# Patient Record
Sex: Female | Born: 1955 | Race: White | Hispanic: No | Marital: Married | Smoking: Former smoker
Health system: Southern US, Community
[De-identification: ages and names within clinical notes are randomized; demographics above are authoritative.]

## PROBLEM LIST (undated history)

## (undated) DIAGNOSIS — K5090 Crohn's disease, unspecified, without complications: Secondary | ICD-10-CM

## (undated) DIAGNOSIS — R51 Headache: Secondary | ICD-10-CM

## (undated) DIAGNOSIS — Z9889 Other specified postprocedural states: Secondary | ICD-10-CM

## (undated) DIAGNOSIS — Z9119 Patient's noncompliance with other medical treatment and regimen: Principal | ICD-10-CM

## (undated) DIAGNOSIS — C801 Malignant (primary) neoplasm, unspecified: Secondary | ICD-10-CM

## (undated) DIAGNOSIS — R519 Headache, unspecified: Secondary | ICD-10-CM

## (undated) DIAGNOSIS — G8929 Other chronic pain: Secondary | ICD-10-CM

## (undated) DIAGNOSIS — J329 Chronic sinusitis, unspecified: Secondary | ICD-10-CM

## (undated) DIAGNOSIS — R112 Nausea with vomiting, unspecified: Secondary | ICD-10-CM

## (undated) HISTORY — DX: Chronic sinusitis, unspecified: J32.9

## (undated) HISTORY — DX: Patient's noncompliance with other medical treatment and regimen: Z91.19

## (undated) HISTORY — PX: BREAST SURGERY: SHX581

## (undated) HISTORY — PX: UTERINE FIBROID SURGERY: SHX826

---

## 1997-09-28 ENCOUNTER — Other Ambulatory Visit: Admission: RE | Admit: 1997-09-28 | Discharge: 1997-09-28 | Payer: Self-pay | Admitting: *Deleted

## 1999-08-19 ENCOUNTER — Encounter: Payer: Self-pay | Admitting: Obstetrics and Gynecology

## 1999-08-19 ENCOUNTER — Encounter: Admission: RE | Admit: 1999-08-19 | Discharge: 1999-08-19 | Payer: Self-pay | Admitting: Obstetrics and Gynecology

## 2000-10-27 ENCOUNTER — Encounter: Payer: Self-pay | Admitting: Obstetrics and Gynecology

## 2000-10-27 ENCOUNTER — Encounter: Admission: RE | Admit: 2000-10-27 | Discharge: 2000-10-27 | Payer: Self-pay | Admitting: Obstetrics and Gynecology

## 2010-09-18 ENCOUNTER — Other Ambulatory Visit (HOSPITAL_COMMUNITY): Payer: Self-pay | Admitting: Internal Medicine

## 2010-09-18 DIAGNOSIS — H532 Diplopia: Secondary | ICD-10-CM

## 2010-09-20 ENCOUNTER — Ambulatory Visit (HOSPITAL_COMMUNITY)
Admission: RE | Admit: 2010-09-20 | Discharge: 2010-09-20 | Disposition: A | Discharge status: Home / Self Care | Payer: 59 | Source: Ambulatory Visit | Attending: Internal Medicine | Admitting: Internal Medicine

## 2010-09-20 ENCOUNTER — Other Ambulatory Visit (HOSPITAL_COMMUNITY): Payer: Self-pay

## 2010-09-20 DIAGNOSIS — H532 Diplopia: Secondary | ICD-10-CM | POA: Insufficient documentation

## 2010-09-20 DIAGNOSIS — R51 Headache: Secondary | ICD-10-CM | POA: Insufficient documentation

## 2010-12-30 ENCOUNTER — Other Ambulatory Visit: Payer: Self-pay | Admitting: Diagnostic Neuroimaging

## 2010-12-30 DIAGNOSIS — H539 Unspecified visual disturbance: Secondary | ICD-10-CM

## 2011-01-09 ENCOUNTER — Other Ambulatory Visit: Payer: 59

## 2011-01-14 ENCOUNTER — Ambulatory Visit
Admission: RE | Admit: 2011-01-14 | Discharge: 2011-01-14 | Disposition: A | Discharge status: Home / Self Care | Payer: 59 | Source: Ambulatory Visit | Attending: Diagnostic Neuroimaging | Admitting: Diagnostic Neuroimaging

## 2011-01-14 DIAGNOSIS — H539 Unspecified visual disturbance: Secondary | ICD-10-CM

## 2012-07-01 ENCOUNTER — Other Ambulatory Visit (HOSPITAL_COMMUNITY): Payer: Self-pay | Admitting: Internal Medicine

## 2012-07-01 DIAGNOSIS — Z139 Encounter for screening, unspecified: Secondary | ICD-10-CM

## 2012-07-05 ENCOUNTER — Ambulatory Visit (HOSPITAL_COMMUNITY): Payer: 59

## 2012-07-06 ENCOUNTER — Ambulatory Visit (HOSPITAL_COMMUNITY)
Admission: RE | Admit: 2012-07-06 | Discharge: 2012-07-06 | Disposition: A | Discharge status: Home / Self Care | Payer: 59 | Source: Ambulatory Visit | Attending: Internal Medicine | Admitting: Internal Medicine

## 2012-07-06 DIAGNOSIS — Z139 Encounter for screening, unspecified: Secondary | ICD-10-CM

## 2012-07-06 DIAGNOSIS — Z1231 Encounter for screening mammogram for malignant neoplasm of breast: Secondary | ICD-10-CM | POA: Insufficient documentation

## 2014-05-31 ENCOUNTER — Encounter (HOSPITAL_COMMUNITY): Payer: Self-pay | Admitting: Emergency Medicine

## 2014-05-31 ENCOUNTER — Emergency Department (HOSPITAL_COMMUNITY): Payer: 59

## 2014-05-31 ENCOUNTER — Emergency Department (HOSPITAL_COMMUNITY)
Admission: EM | Admit: 2014-05-31 | Discharge: 2014-05-31 | Disposition: A | Discharge status: Home / Self Care | Payer: 59 | Attending: Emergency Medicine | Admitting: Emergency Medicine

## 2014-05-31 DIAGNOSIS — Z880 Allergy status to penicillin: Secondary | ICD-10-CM | POA: Diagnosis not present

## 2014-05-31 DIAGNOSIS — Z8719 Personal history of other diseases of the digestive system: Secondary | ICD-10-CM | POA: Insufficient documentation

## 2014-05-31 DIAGNOSIS — Z720 Tobacco use: Secondary | ICD-10-CM | POA: Diagnosis not present

## 2014-05-31 DIAGNOSIS — H81399 Other peripheral vertigo, unspecified ear: Secondary | ICD-10-CM | POA: Diagnosis not present

## 2014-05-31 DIAGNOSIS — G8929 Other chronic pain: Secondary | ICD-10-CM | POA: Insufficient documentation

## 2014-05-31 DIAGNOSIS — R42 Dizziness and giddiness: Secondary | ICD-10-CM | POA: Diagnosis present

## 2014-05-31 HISTORY — DX: Headache: R51

## 2014-05-31 HISTORY — DX: Crohn's disease, unspecified, without complications: K50.90

## 2014-05-31 HISTORY — DX: Headache, unspecified: R51.9

## 2014-05-31 HISTORY — DX: Other chronic pain: G89.29

## 2014-05-31 LAB — CBC WITH DIFFERENTIAL/PLATELET
BASOS PCT: 0 % (ref 0–1)
Basophils Absolute: 0 10*3/uL (ref 0.0–0.1)
Eosinophils Absolute: 0.2 10*3/uL (ref 0.0–0.7)
Eosinophils Relative: 2 % (ref 0–5)
HCT: 33.5 % — ABNORMAL LOW (ref 36.0–46.0)
HEMOGLOBIN: 10.9 g/dL — AB (ref 12.0–15.0)
LYMPHS ABS: 1.4 10*3/uL (ref 0.7–4.0)
Lymphocytes Relative: 13 % (ref 12–46)
MCH: 29.4 pg (ref 26.0–34.0)
MCHC: 32.5 g/dL (ref 30.0–36.0)
MCV: 90.3 fL (ref 78.0–100.0)
MONOS PCT: 11 % (ref 3–12)
Monocytes Absolute: 1.1 10*3/uL — ABNORMAL HIGH (ref 0.1–1.0)
NEUTROS ABS: 7.4 10*3/uL (ref 1.7–7.7)
NEUTROS PCT: 74 % (ref 43–77)
PLATELETS: 167 10*3/uL (ref 150–400)
RBC: 3.71 MIL/uL — AB (ref 3.87–5.11)
RDW: 12.3 % (ref 11.5–15.5)
WBC: 10.1 10*3/uL (ref 4.0–10.5)

## 2014-05-31 LAB — BASIC METABOLIC PANEL
ANION GAP: 10 (ref 5–15)
BUN: 22 mg/dL (ref 6–23)
CALCIUM: 8.8 mg/dL (ref 8.4–10.5)
CO2: 26 mmol/L (ref 19–32)
CREATININE: 0.69 mg/dL (ref 0.50–1.10)
Chloride: 97 mmol/L (ref 96–112)
Glucose, Bld: 95 mg/dL (ref 70–99)
Potassium: 3.9 mmol/L (ref 3.5–5.1)
Sodium: 133 mmol/L — ABNORMAL LOW (ref 135–145)

## 2014-05-31 MED ORDER — MECLIZINE HCL 25 MG PO TABS: 25.0000 mg | ORAL_TABLET | Freq: Three times a day (TID) | ORAL | Status: DC | PRN

## 2014-05-31 MED ORDER — ONDANSETRON 4 MG PO TBDP
4.0000 mg | ORAL_TABLET | Freq: Three times a day (TID) | ORAL | Status: DC | PRN
Start: 2014-05-31 — End: 2014-08-16

## 2014-05-31 MED ORDER — MECLIZINE HCL 12.5 MG PO TABS: 25.0000 mg | ORAL_TABLET | Freq: Once | ORAL | Status: DC

## 2014-05-31 NOTE — ED Notes (Signed)
Pt reports she had been cleaning the house today and sat down to rest. Pt states the room started spinning. Pt reports rapid heart rate during the time. Pt reports "a little dizziness" now with a headache. Pt has Crohn's and reports flare-up that is being treated with flagyl.

## 2014-05-31 NOTE — Discharge Instructions (Signed)
°Emergency Department Resource Guide °1) Find a Doctor and Pay Out of Pocket °Although you won't have to find out who is covered by your insurance plan, it is a good idea to ask around and get recommendations. You will then need to call the office and see if the doctor you have chosen will accept you as a new patient and what types of options they offer for patients who are self-pay. Some doctors offer discounts or will set up payment plans for their patients who do not have insurance, but you will need to ask so you aren't surprised when you get to your appointment. ° °2) Contact Your Local Health Department °Not all health departments have doctors that can see patients for sick visits, but many do, so it is worth a call to see if yours does. If you don't know where your local health department is, you can check in your phone book. The CDC also has a tool to help you locate your state's health department, and many state websites also have listings of all of their local health departments. ° °3) Find a Walk-in Clinic °If your illness is not likely to be very severe or complicated, you may want to try a walk in clinic. These are popping up all over the country in pharmacies, drugstores, and shopping centers. They're usually staffed by nurse practitioners or physician assistants that have been trained to treat common illnesses and complaints. They're usually fairly quick and inexpensive. However, if you have serious medical issues or chronic medical problems, these are probably not your best option. ° °No Primary Care Doctor: °- Call [facility] at  832-8000 - they can help you locate a primary care doctor that  accepts your insurance, provides certain services, etc. °- Physician Referral Service- 1-800-533-3463 ° °Chronic Pain Problems: °Organization         Address  Phone   Notes  °[facility]  (336) 297-2271 Patients need to be referred by their primary care doctor.  ° °Medication  Assistance: °Organization         Address  Phone   Notes  °[place] [facility] [address] °[place], [place] [address] (336) 641-8030 --Must be a resident of [place] °-- Must have NO insurance coverage whatsoever (no Medicaid/ Medicare, etc.) °-- The pt. MUST have a primary care doctor that directs their care regularly and follows them in the community °  °[facility]  (866) 331-1348   °[employer]  (888) 892-1162   ° °Agencies that provide inexpensive medical care: °Organization         Address  Phone   Notes  °[facility]  (336) 832-8035   °[facility]    (336) 832-7272   °[facility] [address] °[place], [place] [address] (336) 832-4777   °[facility] [address], °[place] (336) 271-4999   °[facility]    (336) 373-0678   °[facility]    (336) 272-1050   °[facility] ° [address], [place] Phone:  (336) 832-4444, Fax:  (336) 832-4440 Hours of Operation:  9 am - 6 pm, M-F.  Also accepts Medicaid/Medicare and self-pay.  °[facility] ° [address],  Phone: (336) 832-3150, Fax: (336) 832-3151. Hours of Operation:  8:30 am - 5:30 pm, M-F.  Also accepts Medicaid and self-pay.  °[facility] [place] [address], [place] Phone: (336) 878-6027   °[facility] [address], [place], [place] (336)723-1848, Ext. 123 Mondays & Thursdays: 7-9 AM.  First 15 patients are seen on a first come, first serve basis. °  ° °Medicaid-accepting [place] Providers: ° °Organization         Address  Phone   Notes  °[facility] [address], [place] (336) 641-2100 Also accepts self-pay patients.  °[facility] [address], [place] ° (336) 856-9996   °[facility] [address], [place]  (336) 288-8857   °[facility] [address] [place] [address], [place] (336) 299-7000   °Veita Bland [address], [place]  ° (336) 373-1557 Only accepts [place] Access Medicaid patients after they have their name applied to their card.  ° °Self-Pay (no insurance) in [place]: ° °Organization         Address  Phone   Notes  °Sickle Cell Patients, [facility] [address], [place] (336) 832-1970   °[facility] [address], [place] (336) 832-4400   °[facility] ° [address], [place] (336) 992-4800   °[facility]/Dr. Osei-Bonsu ° [address] [place] [address], [place] or [address], [place] (336) 841-8500 Phone number for both [place] and [place] locations is the same.  °[facility] [address], [place] (336) 299-0000   °[facility] [address] [place] [address], [place] or [address] (336) 852-7530 °(336) 878-2260   °[facility] [address], [place] (336) 350-1642, phone; (336) 294-5005, fax Sees patients 1st and 3rd Saturday of every month.  Must not qualify for public or private insurance (i.e. Medicaid, Medicare, [place] Health Choice, Veterans' Benefits) • Household income should be no more than 200% of the poverty level •The clinic cannot treat you if you are pregnant or think you are pregnant • Sexually transmitted diseases are not treated at the clinic.  ° ° °Dental Care: °Organization         Address  Phone  Notes  °[place] [name] [address], [place] (336) 641-6152 Accepts children up to age 21 who are enrolled in Medicaid or [place] Health Choice; pregnant women with a Medicaid card; and children who have applied for Medicaid or [place] Health Choice, but were declined, whose parents can pay a reduced fee at time of service.  °[place]  [facility] [place]  [address], [place] (336) 641-7733 Accepts children up to age 21 who are enrolled in Medicaid or [place] Health Choice; pregnant women with a Medicaid card; and children who have applied for Medicaid or [place] Health Choice, but were declined, whose parents can pay a reduced fee at time of service.  °[facility] ° [address], [place] (336) 641-4533 Patients are seen by appointment only. Walk-ins are not accepted. [facility] will see patients 18 years of age and older. °Monday - Tuesday (8am-5pm) °Most Wednesdays (8:30-5pm) °$30 per visit, cash only  °[facility] ° [address], [place] (336) 641-4533 Patients are seen by appointment only. Walk-ins are not accepted. [facility] will see patients 18 years of age and older. °One   Wednesday Evening (Monthly: Volunteer Based).  $30 per visit, cash only  °[facility]  (919) 537-3737 for adults; Children under age 4, call Graduate Pediatric Dentistry at (919) [phone]. Children aged 4-14, please call (919) [phone] to request a pediatric application. ° Dental services are provided in all areas of dental care including fillings, crowns and bridges, complete and partial dentures, implants, gum treatment, root canals, and extractions. Preventive care is also provided. Treatment is provided to both adults and children. °Patients are selected via a lottery and there is often a waiting list. °  °[facility] [address], °[place] ° (336) 763-8833 [email or web address] °  °[facility] [address], [place], [place] (336)723-1848, Ext. 123 Second and Fourth Thursday of each month, opens at 6:30 AM; Clinic ends at 9 AM.  Patients are seen on a first-come first-served basis, and a limited number are seen during each clinic.  ° °[facility] ° [address], [place], [place] (336) 723-7904    Eligibility Requirements °You must have lived in [place], [place], or [place] counties for at least the last three months. °  You cannot be eligible for state or federal sponsored healthcare insurance, including Veterans Administration, Medicaid, or Medicare. °  You generally cannot be eligible for healthcare insurance through your employer.  °  How to apply: °Eligibility screenings are held every Tuesday and Wednesday afternoon from 1:00 pm until 4:00 pm. You do not need an appointment for the interview!  °[facility] [address], [place], [place] 336-631-2330   °[place] [facility]  336-342-8273   °[place]  336-703-3100   °[facility]  336-570-6415   ° °Behavioral Health Resources in the Community: °Intensive Outpatient Programs °Organization         Address  Phone  Notes  °[facility] [address], [place], [place] 336-878-6098   °[facility] [address], [place], [place] 336-832-9800   °[facility] [address], [place], [place] ° 336-882-2125   °[facility] [address],  °[place], [place] 1-800-853-5163 or 336-641-4981   °Substance Abuse Resources °Organization         Address  Phone  Notes  °[facility]  336-882-2125   °[facility]  336-784-9470   °[facility]  336-285-9073   °[facility]  336-845-3988   °Residential & Outpatient Substance Abuse Program  1-800-659-3381   °Psychological Services °Organization         Address  Phone  Notes  ° Health  336- 832-9600   °[facility]  336- 378-7881   °[facility] [address], [place] 1-800-853-5163 or 336-641-4981   ° °Mobile Crisis Teams °Organization         Address  Phone  Notes  °[facility], Mobile Crisis Care Unit  1-877-626-1772   °Assertive °[facility] ° [address]  [place], [place] 336-834-9664   °Sharon DeEsch [address] °[place] 336-554-5454   ° °Self-Help/Support Groups °Organization         Address  Phone             Notes  °[facility] [place] - variety of support groups  336- 373-1402 Call for more information  °Narcotics Anonymous (NA), [facility] [address], °[place]   2 meetings at this location  ° °  Residential Treatment Programs °Organization         Address  Phone  Notes  °[facility] [address],    °[place]  1-866-801-8205   °[facility] ° [address], [place], [place] 704-293-8524   °[facility] [address], [place] 336-845-3988 Admissions: 8am-3pm M-F  °[facility] [address]    °[place], [place] 336-841-1104   °[facility] [address], [place], [place] 336-379-7146   °[facility] [address]  °[place], [place] 336-285-9073   °[facility] - Intensive Outpatient [address], [place], [place] 336-852-3033   °[facility] [address]  °[place], [place] 1-877-615-2722 or 336-784-9470   °[facility] [address] [place], [place] 336-227-7417 Accepts Medicaid  °[facility] [address]  °[place] 1-800-659-3381 Substance Abuse/Addiction Treatment  ° °[place] Behavioral Health Resources °Organization         Address  Phone  Notes  °[facility]  (888) 581-9988   °Julie Brannon, PhD [address] [place], [place]   (336) 349-5553 or (336) 951-0000   °[facility]   [address] °[place], [place] (336) 349-4454   °[facility] [address], [place], [place] (336) 342-8316 Insurance/Medicaid/sponsorship through [facility]  °[facility] [address]                                    [place], [place] (336) 342-8316 Therapy/tele-psych/case    °[facility] [address]  ° [place], [place] (336) 349-2233    °Dr. Arfeen  (336) 349-4544   °[facility] [place]  [employer] [place] [facility] 1) [address], [place] °2) [address], [place] °3)  [address], [place] (336) 349-3220 °(336) 342-7768 ° °(336) 342-8140   °[place] [facility] (336) 342-1394 or (336) 342-3537 (After Hours)    ° ° °Take the prescriptions as directed.  Call your regular medical doctor tomorrow to schedule a follow up appointment within the next 2 days.  Return to the Emergency Department immediately sooner if worsening.  ° °

## 2014-05-31 NOTE — ED Provider Notes (Signed)
CSN: [id number]     Arrival date & time 05/31/14  1513 History   First MD Initiated Contact with Patient 05/31/14 1543     Chief Complaint  Patient presents with  . Dizziness     HPI Pt was seen at 1555. Per pt, c/o sudden onset and resolution of one episode of "dizziness" that occurred approximately 1400 PTA. Pt states she was sitting down and "suddenly felt like everything was spinning around." Was associated with nausea. Symptoms worsened with movement of her head or body; improved when she closed her eyes. Pt states she "feels better" since arrival to the ED. Denies vomiting/diarrhea, no abd pain, no CP/SOB, no back pain, no visual changes, no focal motor weakness, no tingling/numbness in extremities, no slurred speech, no facial droop.    Past Medical History  Diagnosis Date  . Crohn's disease   . Chronic headache    Past Surgical History  Procedure Laterality Date  . Breast surgery    . Uterine fibroid surgery     Family History  Problem Relation Age of Onset  . Hypertension Mother   . Cancer Father    History  Substance Use Topics  . Smoking status: Current Every Day Smoker -- 0.50 packs/day    Types: Cigarettes  . Smokeless tobacco: Never Used  . Alcohol Use: No   OB History    Gravida Para Term Preterm AB TAB SAB Ectopic Multiple Living   3 2 2  1  1         Review of Systems ROS: Statement: All systems negative except as marked or noted in the HPI; Constitutional: Negative for fever and chills. ; ; Eyes: Negative for eye pain, redness and discharge. ; ; ENMT: Negative for ear pain, hoarseness, nasal congestion, sinus pressure and sore throat. ; ; Cardiovascular: Negative for chest pain, palpitations, diaphoresis, dyspnea and peripheral edema. ; ; Respiratory: Negative for cough, wheezing and stridor. ; ; Gastrointestinal: +nausea. Negative for vomiting, diarrhea, abdominal pain, blood in stool, hematemesis, jaundice and rectal bleeding. . ; ; Genitourinary: Negative  for dysuria, flank pain and hematuria. ; ; Musculoskeletal: Negative for back pain and neck pain. Negative for swelling and trauma.; ; Skin: Negative for pruritus, rash, abrasions, blisters, bruising and skin lesion.; ; Neuro: +"dizziness." Negative for headache, lightheadedness and neck stiffness. Negative for weakness, altered level of consciousness , altered mental status, extremity weakness, paresthesias, involuntary movement, seizure and syncope.     Allergies  Avelox; Codeine; Dextromethorphan; Erythromycin; and Penicillins  Home Medications   Prior to Admission medications   Medication Sig Start Date End Date Taking? Authorizing Provider  acetaminophen (TYLENOL) 500 MG tablet Take 500 mg by mouth every 6 (six) hours as needed.   Yes Historical Provider, MD  cromolyn (NASALCROM) 5.2 MG/ACT nasal spray Place 1 spray into both nostrils at bedtime.   Yes Historical Provider, MD  Cyanocobalamin 1500 MCG TBDP Take 1 tablet by mouth daily.   Yes Historical Provider, MD  loperamide (IMODIUM) 2 MG capsule Take 2 mg by mouth as needed for diarrhea or loose stools.   Yes Historical Provider, MD  Magnesium 250 MG TABS Take 1 tablet by mouth daily.   Yes Historical Provider, MD  metroNIDAZOLE (FLAGYL) 500 MG tablet Take 500 mg by mouth 3 (three) times daily. 05/29/14  Yes Historical Provider, MD   BP 120/59 mmHg  Pulse 67  Temp(Src) 98.4 F (36.9 C) (Oral)  Resp 18  Ht 5\' 7"  (1.702 m)  Wt 125 lb (  56.7 kg)  BMI 19.57 kg/m2  SpO2 96% Physical Exam  1600; Physical examination:  Nursing notes reviewed; Vital signs and O2 SAT reviewed;  Constitutional: Well developed, Well nourished, Well hydrated, In no acute distress; Head:  Normocephalic, atraumatic; Eyes: EOMI, PERRL, No scleral icterus; ENMT: TM's clear bilat. +edemetous nasal turbinates bilat with clear rhinorrhea. Mouth and pharynx normal, Mucous membranes moist; Neck: Supple, Full range of motion, No lymphadenopathy; Cardiovascular: Regular  rate and rhythm, No murmur, rub, or gallop; Respiratory: Breath sounds clear & equal bilaterally, No rales, rhonchi, wheezes.  Speaking full sentences with ease, Normal respiratory effort/excursion; Chest: Nontender, Movement normal; Abdomen: Soft, Nontender, Nondistended, Normal bowel sounds; Genitourinary: No CVA tenderness; Extremities: Pulses normal, No tenderness, No edema, No calf edema or asymmetry.; Neuro: AA&Ox3, Major CN grossly intact. Speech clear.  No facial droop.  +left horizontal end gaze fatigable nystagmus. Grips equal. Strength 5/5 equal bilat UE's and LE's.  DTR 2/4 equal bilat UE's and LE's.  No gross sensory deficits.  Normal cerebellar testing bilat UE's (finger-nose) and LE's (heel-shin)..; Skin: Color normal, Warm, Dry.   ED Course  Procedures      EKG Interpretation   Date/Time:  Wednesday May 31 2014 15:31:50 EST Ventricular Rate:  80 PR Interval:  126 QRS Duration: 90 QT Interval:  372 QTC Calculation: 429 R Axis:   60 Text Interpretation:  Normal sinus rhythm Normal ECG No old tracing to  compare Confirmed by [facility]  MD, Nunzio Cory [phone]) on 05/31/2014 4:06:21 PM      MDM  MDM Reviewed: previous chart, nursing note and vitals Reviewed previous: labs Interpretation: labs, ECG and CT scan      Results for orders placed or performed during the hospital encounter of 86/76/19  Basic metabolic panel  Result Value Ref Range   Sodium 133 (L) 135 - 145 mmol/L   Potassium 3.9 3.5 - 5.1 mmol/L   Chloride 97 96 - 112 mmol/L   CO2 26 19 - 32 mmol/L   Glucose, Bld 95 70 - 99 mg/dL   BUN 22 6 - 23 mg/dL   Creatinine, Ser 0.69 0.50 - 1.10 mg/dL   Calcium 8.8 8.4 - 10.5 mg/dL   GFR calc non Af Amer >90 >90 mL/min   GFR calc Af Amer >90 >90 mL/min   Anion gap 10 5 - 15  CBC with Differential  Result Value Ref Range   WBC 10.1 4.0 - 10.5 K/uL   RBC 3.71 (L) 3.87 - 5.11 MIL/uL   Hemoglobin 10.9 (L) 12.0 - 15.0 g/dL   HCT 33.5 (L) 36.0 - 46.0 %   MCV 90.3  78.0 - 100.0 fL   MCH 29.4 26.0 - 34.0 pg   MCHC 32.5 30.0 - 36.0 g/dL   RDW 12.3 11.5 - 15.5 %   Platelets 167 150 - 400 K/uL   Neutrophils Relative % 74 43 - 77 %   Neutro Abs 7.4 1.7 - 7.7 K/uL   Lymphocytes Relative 13 12 - 46 %   Lymphs Abs 1.4 0.7 - 4.0 K/uL   Monocytes Relative 11 3 - 12 %   Monocytes Absolute 1.1 (H) 0.1 - 1.0 K/uL   Eosinophils Relative 2 0 - 5 %   Eosinophils Absolute 0.2 0.0 - 0.7 K/uL   Basophils Relative 0 0 - 1 %   Basophils Absolute 0.0 0.0 - 0.1 K/uL   Ct Head Wo Contrast 05/31/2014   CLINICAL DATA:  Dizziness  EXAM: CT HEAD WITHOUT CONTRAST  TECHNIQUE:  Contiguous axial images were obtained from the base of the skull through the vertex without intravenous contrast.  COMPARISON:  01/14/2011  FINDINGS: No skull fracture is noted. Paranasal sinuses and mastoid air cells are unremarkable.  No intracranial hemorrhage, mass effect or midline shift.  Ventricular size is stable from prior exam. No acute infarction. No mass lesion is noted on this unenhanced scan.  IMPRESSION: No acute intracranial abnormality.   Electronically Signed   By: Lahoma Crocker M.D.   On: 05/31/2014 17:13    1800:  Pt refused meds while in the ED because she "feels better." Pt ambulated with steady gait, resps easy, NAD. Pt is not orthostatic. Pt states she has had multiple episodes of diarrhea and "is taking flagyl because I think I'm having a Crohn's flair." Electrolytes and WBC count reassuring. Pt states she wants to go home now. Will continue to tx symptomatically at this time. Dx and testing d/w pt and family.  Questions answered.  Verb understanding, agreeable to d/c home with outpt f/u.   Francine Graven, DO 06/02/14 2152

## 2014-05-31 NOTE — ED Notes (Signed)
Pt ambulated with steady gait. Pt denied dizziness. Pt 02 saturation 94-96% while ambulating. Pt heart rate between 78-83. nad noted.

## 2014-05-31 NOTE — ED Notes (Signed)
Pt denies any dizziness at this time. EDP aware pt refused meclizine at this time.

## 2014-06-29 ENCOUNTER — Inpatient Hospital Stay (HOSPITAL_COMMUNITY)
Admission: AD | Admit: 2014-06-29 | Discharge: 2014-07-11 | DRG: 375 | Disposition: A | Discharge status: Home / Self Care | Payer: 59 | Source: Ambulatory Visit | Attending: Gastroenterology | Admitting: Gastroenterology

## 2014-06-29 ENCOUNTER — Encounter (HOSPITAL_COMMUNITY): Payer: Self-pay | Admitting: General Practice

## 2014-06-29 ENCOUNTER — Inpatient Hospital Stay (HOSPITAL_COMMUNITY): Payer: 59

## 2014-06-29 DIAGNOSIS — Z885 Allergy status to narcotic agent status: Secondary | ICD-10-CM | POA: Diagnosis not present

## 2014-06-29 DIAGNOSIS — Z881 Allergy status to other antibiotic agents status: Secondary | ICD-10-CM | POA: Diagnosis not present

## 2014-06-29 DIAGNOSIS — Z880 Allergy status to penicillin: Secondary | ICD-10-CM | POA: Diagnosis not present

## 2014-06-29 DIAGNOSIS — J90 Pleural effusion, not elsewhere classified: Secondary | ICD-10-CM | POA: Diagnosis not present

## 2014-06-29 DIAGNOSIS — F17210 Nicotine dependence, cigarettes, uncomplicated: Secondary | ICD-10-CM | POA: Diagnosis present

## 2014-06-29 DIAGNOSIS — C20 Malignant neoplasm of rectum: Secondary | ICD-10-CM

## 2014-06-29 DIAGNOSIS — R630 Anorexia: Secondary | ICD-10-CM | POA: Diagnosis present

## 2014-06-29 DIAGNOSIS — D500 Iron deficiency anemia secondary to blood loss (chronic): Secondary | ICD-10-CM | POA: Diagnosis present

## 2014-06-29 DIAGNOSIS — N39490 Overflow incontinence: Secondary | ICD-10-CM | POA: Diagnosis present

## 2014-06-29 DIAGNOSIS — Z419 Encounter for procedure for purposes other than remedying health state, unspecified: Secondary | ICD-10-CM

## 2014-06-29 DIAGNOSIS — Z809 Family history of malignant neoplasm, unspecified: Secondary | ICD-10-CM

## 2014-06-29 DIAGNOSIS — E876 Hypokalemia: Secondary | ICD-10-CM | POA: Diagnosis not present

## 2014-06-29 DIAGNOSIS — C189 Malignant neoplasm of colon, unspecified: Secondary | ICD-10-CM | POA: Diagnosis not present

## 2014-06-29 DIAGNOSIS — K30 Functional dyspepsia: Secondary | ICD-10-CM | POA: Diagnosis present

## 2014-06-29 DIAGNOSIS — C786 Secondary malignant neoplasm of retroperitoneum and peritoneum: Secondary | ICD-10-CM | POA: Diagnosis present

## 2014-06-29 DIAGNOSIS — J95811 Postprocedural pneumothorax: Secondary | ICD-10-CM | POA: Diagnosis not present

## 2014-06-29 DIAGNOSIS — Z452 Encounter for adjustment and management of vascular access device: Secondary | ICD-10-CM

## 2014-06-29 DIAGNOSIS — K631 Perforation of intestine (nontraumatic): Secondary | ICD-10-CM

## 2014-06-29 DIAGNOSIS — Z888 Allergy status to other drugs, medicaments and biological substances status: Secondary | ICD-10-CM | POA: Diagnosis not present

## 2014-06-29 DIAGNOSIS — J939 Pneumothorax, unspecified: Secondary | ICD-10-CM | POA: Diagnosis not present

## 2014-06-29 DIAGNOSIS — Z9689 Presence of other specified functional implants: Secondary | ICD-10-CM

## 2014-06-29 DIAGNOSIS — R634 Abnormal weight loss: Secondary | ICD-10-CM | POA: Diagnosis present

## 2014-06-29 DIAGNOSIS — R509 Fever, unspecified: Secondary | ICD-10-CM | POA: Diagnosis not present

## 2014-06-29 DIAGNOSIS — R1030 Lower abdominal pain, unspecified: Secondary | ICD-10-CM

## 2014-06-29 DIAGNOSIS — D649 Anemia, unspecified: Secondary | ICD-10-CM | POA: Diagnosis not present

## 2014-06-29 DIAGNOSIS — C19 Malignant neoplasm of rectosigmoid junction: Secondary | ICD-10-CM | POA: Diagnosis present

## 2014-06-29 DIAGNOSIS — Z681 Body mass index (BMI) 19 or less, adult: Secondary | ICD-10-CM

## 2014-06-29 DIAGNOSIS — Z8249 Family history of ischemic heart disease and other diseases of the circulatory system: Secondary | ICD-10-CM | POA: Diagnosis not present

## 2014-06-29 DIAGNOSIS — R0902 Hypoxemia: Secondary | ICD-10-CM

## 2014-06-29 DIAGNOSIS — C787 Secondary malignant neoplasm of liver and intrahepatic bile duct: Secondary | ICD-10-CM | POA: Diagnosis present

## 2014-06-29 DIAGNOSIS — K5090 Crohn's disease, unspecified, without complications: Secondary | ICD-10-CM | POA: Diagnosis present

## 2014-06-29 DIAGNOSIS — K639 Disease of intestine, unspecified: Secondary | ICD-10-CM | POA: Diagnosis present

## 2014-06-29 HISTORY — DX: Malignant (primary) neoplasm, unspecified: C80.1

## 2014-06-29 HISTORY — PX: COLONOSCOPY W/ BIOPSIES: SHX1374

## 2014-06-29 LAB — COMPREHENSIVE METABOLIC PANEL
ALT: 40 U/L — ABNORMAL HIGH (ref 0–35)
ANION GAP: 13 (ref 5–15)
AST: 43 U/L — AB (ref 0–37)
Albumin: 3.2 g/dL — ABNORMAL LOW (ref 3.5–5.2)
Alkaline Phosphatase: 158 U/L — ABNORMAL HIGH (ref 39–117)
BILIRUBIN TOTAL: 0.7 mg/dL (ref 0.3–1.2)
BUN: 16 mg/dL (ref 6–23)
CALCIUM: 8.9 mg/dL (ref 8.4–10.5)
CO2: 28 mmol/L (ref 19–32)
CREATININE: 0.65 mg/dL (ref 0.50–1.10)
Chloride: 98 mmol/L (ref 96–112)
GLUCOSE: 88 mg/dL (ref 70–99)
Potassium: 3.5 mmol/L (ref 3.5–5.1)
SODIUM: 139 mmol/L (ref 135–145)
TOTAL PROTEIN: 6.4 g/dL (ref 6.0–8.3)

## 2014-06-29 LAB — CBC
HEMATOCRIT: 31 % — AB (ref 36.0–46.0)
Hemoglobin: 10 g/dL — ABNORMAL LOW (ref 12.0–15.0)
MCH: 28.2 pg (ref 26.0–34.0)
MCHC: 32.3 g/dL (ref 30.0–36.0)
MCV: 87.6 fL (ref 78.0–100.0)
Platelets: 195 10*3/uL (ref 150–400)
RBC: 3.54 MIL/uL — ABNORMAL LOW (ref 3.87–5.11)
RDW: 12.7 % (ref 11.5–15.5)
WBC: 10.9 10*3/uL — ABNORMAL HIGH (ref 4.0–10.5)

## 2014-06-29 MED ORDER — IOHEXOL 300 MG/ML  SOLN
100.0000 mL | Freq: Once | INTRAMUSCULAR | Status: AC | PRN
  Administered 2014-06-29: 100 mL via INTRAVENOUS

## 2014-06-29 MED ORDER — NICOTINE 14 MG/24HR TD PT24
14.0000 mg | MEDICATED_PATCH | Freq: Every day | TRANSDERMAL | Status: DC
  Administered 2014-06-30 – 2014-07-10 (×11): 14 mg via TRANSDERMAL
  Filled 2014-06-29 (×12): qty 1

## 2014-06-29 MED ORDER — IOHEXOL 300 MG/ML  SOLN
25.0000 mL | Freq: Once | INTRAMUSCULAR | Status: AC | PRN
  Administered 2014-06-29: 25 mL via ORAL

## 2014-06-29 MED ORDER — ZOLPIDEM TARTRATE 5 MG PO TABS
5.0000 mg | ORAL_TABLET | Freq: Every evening | ORAL | Status: DC | PRN
  Administered 2014-06-29 – 2014-07-01 (×3): 5 mg via ORAL
  Filled 2014-06-29 (×3): qty 1

## 2014-06-29 MED ORDER — SODIUM CHLORIDE 0.9 % IV SOLN
INTRAVENOUS | Status: DC
  Administered 2014-06-29 – 2014-07-01 (×5): via INTRAVENOUS

## 2014-06-29 NOTE — Progress Notes (Signed)
Admitted to room 6n23 from Dr. Benson Norway office. Husband at bedside. Dr. Minerva Areola office notified of arrival to unit.

## 2014-06-29 NOTE — H&P (Signed)
  Sydney Morrison HPI: This is a 59 year old female with a PMH of Crohn's disease diagnosed in 73 with a colonoscopy.  Since that time she has not undergone any repeat colonoscopies since that time.  She was treated with prednisone, 5-ASA, and metronidazole.  The patient recalls that metronidazole made the biggest difference in her and she cannot tolerate prednisone.  Over the years she was treated intermittently with metronidazole and she was last followed by Dr. Sammuel Cooper.  A couple of months ago she started to have issues with intermittent bloody diarrhea.  Her symptoms progressively worsened and then she sought treatment.  In the office she was noted to have heme positive stool and her blood work revealed mild elevations in her AST/ALT as well as an elevated CRP.  C. Diff was evaluated for her diarrhea and she was positive.  Subsequently she was started on vancomycin 125 mg QID x 10 days, but after 7 days she did not respond to the medication.  The patient was then put back on the schedule for a colonoscopy today.  During today's examination the pediatric colonoscope encountered a large friable circumfirentially obstructing mass at 10 cm from the anal verge.  An adult endoscope was then used to try an navigate through the area, but it was too narrow.  Biopsies of the mass were obtained and then she was sent to the hospital for admission.  Past Medical History  Diagnosis Date  . Crohn's disease   . Chronic headache     Past Surgical History  Procedure Laterality Date  . Breast surgery    . Uterine fibroid surgery      Family History  Problem Relation Age of Onset  . Hypertension Mother   . Cancer Father     Social History:  reports that she has been smoking Cigarettes.  She has been smoking about 0.50 packs per day. She has never used smokeless tobacco. She reports that she does not drink alcohol or use illicit drugs.  Allergies:  Allergies  Allergen Reactions  . Avelox [Moxifloxacin Hcl  In Nacl] Hives  . Codeine Hives  . Dextromethorphan Hives  . Erythromycin Hives  . Penicillins Hives    Medications:  Scheduled:  Continuous: . sodium chloride      No results found for this or any previous visit (from the past 24 hour(s)).   No results found.  ROS:  As stated above in the HPI otherwise negative.  Blood pressure 136/66, pulse 81, temperature 98.4 F (36.9 C), temperature source Oral, resp. rate 19, SpO2 99 %.    PE: Gen: NAD, Alert and Oriented HEENT:  [place]/AT, EOMI Neck: Supple, no LAD Lungs: CTA Bilaterally CV: RRR without M/G/R ABM: Soft, NTND, +BS Ext: No C/C/E  Assessment/Plan: 1) Obstructing rectosigmoid colon mass. 2) Crohn's disease. 3) Abnormal liver enzymes.   She has overflow incontinence as the source of her diarrhea.  I believe her C. Diff has been treated.  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  Regardless, she requires surgical intervention.    Plan: 1) Check CEA. 2) CT scan of the Chest/ABM/Pelvis. 3) Surgical consultation.  Sydney Morrison D 06/29/2014, 1:23 PM

## 2014-06-29 NOTE — Consult Note (Signed)
Reason for Consult:Rectal mass Referring Physician: Keianna Signer is an 59 y.o. female.  HPI: Sydney Morrison, with a Panacea significant for Crohn's disease and recently for a diagnosis of C diff, had sought treatment for intermittent bloody diarrhea. She was diagnosed with C diff and treated appropriately but it didn't resolve her symptoms. Therefore she underwent colonoscopy where an obstructing mass was identified 10cm from the anal verge. Biopsies were taken. She reports concurrent constitutional symptoms of fevers, fatigue, and unexplained weight loss over the past 1-2 months.  Past Medical History  Diagnosis Date  . Crohn's disease   . Chronic headache     Past Surgical History  Procedure Laterality Date  . Breast surgery    . Uterine fibroid surgery    . Colonoscopy w/ biopsies  06/29/2014    DR HUNG    Family History  Problem Relation Age of Onset  . Hypertension Mother   . Cancer Father     Social History:  reports that she has been smoking Cigarettes.  She has a 20 pack-year smoking history. She has never used smokeless tobacco. She reports that she does not drink alcohol or use illicit drugs.  Allergies:  Allergies  Allergen Reactions  . Avelox [Moxifloxacin Hcl In Nacl] Hives  . Codeine Hives  . Dextromethorphan Hives  . Erythromycin Hives  . Flagyl [Metronidazole] Nausea Only  . Penicillins Hives  . Suprep [Na Sulfate-K Sulfate-Mg Sulf] Nausea And Vomiting    Medications: I have reviewed the patient's current medications.  Results for orders placed or performed during the hospital encounter of 06/29/14 (from the past 48 hour(s))  Comprehensive metabolic panel     Status: Abnormal   Collection Time: 06/29/14  2:51 PM  Result Value Ref Range   Sodium 139 135 - 145 mmol/L   Potassium 3.5 3.5 - 5.1 mmol/L   Chloride 98 96 - 112 mmol/L   CO2 28 19 - 32 mmol/L   Glucose, Bld 88 70 - 99 mg/dL   BUN 16 6 - 23 mg/dL   Creatinine, Ser 0.65 0.50 - 1.10 mg/dL   Calcium 8.9 8.4 - 10.5 mg/dL   Total Protein 6.4 6.0 - 8.3 g/dL   Albumin 3.2 (L) 3.5 - 5.2 g/dL   AST 43 (H) 0 - 37 U/L   ALT 40 (H) 0 - 35 U/L   Alkaline Phosphatase 158 (H) 39 - 117 U/L   Total Bilirubin 0.7 0.3 - 1.2 mg/dL   GFR calc non Af Amer >90 >90 mL/min   GFR calc Af Amer >90 >90 mL/min    Comment: (NOTE) The eGFR has been calculated using the CKD EPI equation. This calculation has not been validated in all clinical situations. eGFR's persistently <90 mL/min signify possible Chronic Kidney Disease.    Anion gap 13 5 - 15  CBC     Status: Abnormal   Collection Time: 06/29/14  2:51 PM  Result Value Ref Range   WBC 10.9 (H) 4.0 - 10.5 K/uL   RBC 3.54 (L) 3.87 - 5.11 MIL/uL   Hemoglobin 10.0 (L) 12.0 - 15.0 g/dL   HCT 31.0 (L) 36.0 - 46.0 %   MCV 87.6 78.0 - 100.0 fL   MCH 28.2 26.0 - 34.0 pg   MCHC 32.3 30.0 - 36.0 g/dL   RDW 12.7 11.5 - 15.5 %   Platelets 195 150 - 400 K/uL   Review of Systems  Constitutional: Positive for fever, chills, weight loss, malaise/fatigue and diaphoresis.  Respiratory: Negative for cough and shortness of breath.   Cardiovascular: Negative for chest pain.  Gastrointestinal: Positive for diarrhea and blood in stool. Negative for heartburn, nausea, vomiting and abdominal pain.  Genitourinary: Positive for flank pain. Negative for dysuria.  Musculoskeletal: Negative for myalgias, back pain and neck pain.  Neurological: Positive for weakness and headaches. Negative for dizziness.  Endo/Heme/Allergies: Does not bruise/bleed easily.   Blood pressure 136/66, pulse 81, temperature 98.4 F (36.9 C), temperature source Oral, resp. rate 19, height _0  (1.702 m), weight 54.432 kg (120 lb), SpO2 99 %. Physical Exam  Constitutional: She appears well-developed and well-nourished. No distress.  HENT:  Head: Normocephalic and atraumatic.  Right Ear: External ear normal.  Left Ear: External ear normal.  Eyes: Conjunctivae are normal. Pupils are  equal, round, and reactive to light. Right eye exhibits no discharge. Left eye exhibits no discharge. No scleral icterus.  Neck: Normal range of motion. Neck supple.  Cardiovascular: Normal rate, regular rhythm, normal heart sounds and intact distal pulses.  Exam reveals no gallop and no friction rub.   No murmur heard. Respiratory: Effort normal and breath sounds normal. No respiratory distress. She has no wheezes. She has no rales.  GI: Soft. Bowel sounds are normal. She exhibits no distension. There is no tenderness.  Musculoskeletal: Normal range of motion.  Lymphadenopathy:    She has no cervical adenopathy.  Neurological: She is alert.  Skin: Skin is warm and dry. She is not diaphoretic.  Psychiatric: She has a normal mood and affect.    Assessment/Plan: Rectal mass -- Will get CT of chest, abd, and pelvis and await biopsy results. Plan dependent on results of studies. Repeat LFT's in am. Crohn's disease Anemia -- Will follow    [name], PA-C Pager: [phone] 06/29/2014, 4:03 PM

## 2014-06-30 ENCOUNTER — Encounter (HOSPITAL_COMMUNITY): Payer: Self-pay | Admitting: *Deleted

## 2014-06-30 ENCOUNTER — Encounter (HOSPITAL_COMMUNITY)
Admission: AD | Disposition: A | Discharge status: Home / Self Care | Payer: Self-pay | Source: Ambulatory Visit | Attending: Gastroenterology

## 2014-06-30 ENCOUNTER — Inpatient Hospital Stay (HOSPITAL_COMMUNITY): Payer: 59

## 2014-06-30 HISTORY — PX: FLEXIBLE SIGMOIDOSCOPY: SHX5431

## 2014-06-30 HISTORY — PX: COLONIC STENT PLACEMENT: SHX5542

## 2014-06-30 LAB — COMPREHENSIVE METABOLIC PANEL
ALK PHOS: 141 U/L — AB (ref 39–117)
ALT: 33 U/L (ref 0–35)
ANION GAP: 13 (ref 5–15)
AST: 37 U/L (ref 0–37)
Albumin: 2.8 g/dL — ABNORMAL LOW (ref 3.5–5.2)
BILIRUBIN TOTAL: 0.8 mg/dL (ref 0.3–1.2)
BUN: 14 mg/dL (ref 6–23)
CHLORIDE: 102 mmol/L (ref 96–112)
CO2: 22 mmol/L (ref 19–32)
CREATININE: 0.8 mg/dL (ref 0.50–1.10)
Calcium: 8.2 mg/dL — ABNORMAL LOW (ref 8.4–10.5)
GFR calc Af Amer: >90 mL/min (ref >90)
GFR, EST NON AFRICAN AMERICAN: 80 mL/min — AB (ref >90)
GLUCOSE: 62 mg/dL — AB (ref 70–99)
Potassium: 3.6 mmol/L (ref 3.5–5.1)
Sodium: 137 mmol/L (ref 135–145)
Total Protein: 5.7 g/dL — ABNORMAL LOW (ref 6.0–8.3)

## 2014-06-30 LAB — CBC
HEMATOCRIT: 28.2 % — AB (ref 36.0–46.0)
Hemoglobin: 9 g/dL — ABNORMAL LOW (ref 12.0–15.0)
MCH: 27.9 pg (ref 26.0–34.0)
MCHC: 31.9 g/dL (ref 30.0–36.0)
MCV: 87.3 fL (ref 78.0–100.0)
Platelets: 171 10*3/uL (ref 150–400)
RBC: 3.23 MIL/uL — ABNORMAL LOW (ref 3.87–5.11)
RDW: 12.8 % (ref 11.5–15.5)
WBC: 9.5 10*3/uL (ref 4.0–10.5)

## 2014-06-30 LAB — CEA: CEA: 54.7 ng/mL — AB (ref 0.0–4.7)

## 2014-06-30 LAB — CLOSTRIDIUM DIFFICILE BY PCR: Toxigenic C. Difficile by PCR: NEGATIVE

## 2014-06-30 SURGERY — SIGMOIDOSCOPY, FLEXIBLE
Anesthesia: Moderate Sedation

## 2014-06-30 MED ORDER — ACETAMINOPHEN 325 MG PO TABS
650.0000 mg | ORAL_TABLET | Freq: Four times a day (QID) | ORAL | Status: DC | PRN
  Administered 2014-06-30 – 2014-07-01 (×4): 325 mg via ORAL
  Administered 2014-07-01 – 2014-07-10 (×24): 650 mg via ORAL
  Filled 2014-06-30 (×29): qty 2

## 2014-06-30 MED ORDER — CEFTRIAXONE SODIUM IN DEXTROSE 20 MG/ML IV SOLN: 1.0000 g | Freq: Two times a day (BID) | INTRAVENOUS | Status: DC

## 2014-06-30 MED ORDER — MIDAZOLAM HCL 10 MG/2ML IJ SOLN
INTRAMUSCULAR | Status: DC | PRN
  Administered 2014-06-30 (×2): 1 mg via INTRAVENOUS
  Administered 2014-06-30 (×2): 2 mg via INTRAVENOUS

## 2014-06-30 MED ORDER — MIDAZOLAM HCL 5 MG/ML IJ SOLN
INTRAMUSCULAR | Status: AC
Start: 2014-06-30 — End: 2014-06-30
  Filled 2014-06-30: qty 2

## 2014-06-30 MED ORDER — BOOST / RESOURCE BREEZE PO LIQD: 1.0000 | Freq: Three times a day (TID) | ORAL | Status: DC

## 2014-06-30 MED ORDER — FENTANYL CITRATE 0.05 MG/ML IJ SOLN
INTRAMUSCULAR | Status: DC | PRN
  Administered 2014-06-30 (×3): 25 ug via INTRAVENOUS

## 2014-06-30 MED ORDER — DIPHENHYDRAMINE HCL 50 MG/ML IJ SOLN
INTRAMUSCULAR | Status: AC
  Filled 2014-06-30: qty 1

## 2014-06-30 MED ORDER — FENTANYL CITRATE 0.05 MG/ML IJ SOLN
INTRAMUSCULAR | Status: AC
  Filled 2014-06-30: qty 2

## 2014-06-30 MED ORDER — SODIUM CHLORIDE 0.9 % IV SOLN: INTRAVENOUS | Status: DC

## 2014-06-30 MED ORDER — METRONIDAZOLE IN NACL 5-0.79 MG/ML-% IV SOLN: 500.0000 mg | Freq: Three times a day (TID) | INTRAVENOUS | Status: DC

## 2014-06-30 SURGICAL SUPPLY — 1 items: Wallflex Colonic stent ×3 IMPLANT

## 2014-06-30 NOTE — Progress Notes (Signed)
Patient ID: Sydney Morrison, female   DOB: [birth year], 59 y.o.   MRN: [id number]    Subjective: Patient feels ok this morning.  As well as can be expected given the news.  She does get nausea after eating, but is not right now since she has not eaten.  Passing some diarrhea.  No abdominal pain  Objective: Vital signs in last 24 hours: Temp:  [98.4 F (36.9 C)-98.6 F (37 C)] 98.6 F (37 C) (04/01 0650) Pulse Rate:  [74-81] 74 (04/01 0650) Resp:  [16-19] 16 (04/01 0650) BP: (113-136)/(50-66) 113/50 mmHg (04/01 0650) SpO2:  [88 %-99 %] 98 % (04/01 0650) Weight:  [54.432 kg (120 lb)] 54.432 kg (120 lb) (03/31 1322) Last BM Date: 06/30/14  Intake/Output from previous day: 03/31 0701 - 04/01 0700 In: 2752.5 [P.O.:480; I.V.:2272.5] Out: -  Intake/Output this shift:    PE: Abd: soft, NT, mild distention, +BS Heart: regular Lungs: CTAB  Lab Results:   Recent Labs  06/29/14 1451 06/30/14 0435  WBC 10.9* 9.5  HGB 10.0* 9.0*  HCT 31.0* 28.2*  PLT 195 171   BMET  Recent Labs  06/29/14 1451 06/30/14 0435  NA 139 137  K 3.5 3.6  CL 98 102  CO2 28 22  GLUCOSE 88 62*  BUN 16 14  CREATININE 0.65 0.80  CALCIUM 8.9 8.2*   PT/INR No results for input(s): LABPROT, INR in the last 72 hours. CMP     Component Value Date/Time   NA 137 06/30/2014 0435   K 3.6 06/30/2014 0435   CL 102 06/30/2014 0435   CO2 22 06/30/2014 0435   GLUCOSE 62* 06/30/2014 0435   BUN 14 06/30/2014 0435   CREATININE 0.80 06/30/2014 0435   CALCIUM 8.2* 06/30/2014 0435   PROT 5.7* 06/30/2014 0435   ALBUMIN 2.8* 06/30/2014 0435   AST 37 06/30/2014 0435   ALT 33 06/30/2014 0435   ALKPHOS 141* 06/30/2014 0435   BILITOT 0.8 06/30/2014 0435   GFRNONAA 80* 06/30/2014 0435   GFRAA >90 06/30/2014 0435   Lipase  No results found for: LIPASE     Studies/Results: Ct Chest W Contrast  06/29/2014   CLINICAL DATA:  Newly diagnosed colon carcinoma.  Crohn's disease.  EXAM: CT CHEST, ABDOMEN, AND  PELVIS WITH CONTRAST  TECHNIQUE: Multidetector CT imaging of the chest, abdomen and pelvis was performed following the standard protocol during bolus administration of intravenous contrast.  CONTRAST:  172mL OMNIPAQUE IOHEXOL 300 MG/ML  SOLN  COMPARISON:  None.  FINDINGS: CT CHEST FINDINGS  Mediastinum/Lymph Nodes: No masses or pathologically enlarged lymph nodes identified. 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  Lungs/Pleura: No pulmonary infiltrate or mass identified. No effusion present.  Musculoskeletal/Soft Tissues: No suspicious bone lesions or other significant chest wall abnormality.  CT ABDOMEN AND PELVIS FINDINGS  Hepatobiliary: Numerous hypovascular masses are seen involving the liver diffusely throughout both the right and left hepatic lobes. These are consistent with liver metastases. Largest index mass in the lateral segment of the left lobe measures 6.5 x 8.8 cm on image 62/series 201. Gallbladder is unremarkable. No evidence of biliary dilatation.  Pancreas: No mass, inflammatory changes, or other significant abnormality identified.  Spleen:  Within normal limits in size and appearance.  Adrenals:  No masses identified.  Kidneys/Urinary Tract:  No evidence of masses or hydronephrosis.  Stomach/Bowel/Peritoneum: Annular constricting mass is seen involving the distal sigmoid colon rectum which measures approximately 7 cm in length. There is  mild proximal colonic dilatation. This is consistent with an obstructing colon carcinoma.  In addition, there are multiple ill-defined peritoneal soft tissue masses seen within the right adnexal region and anterior pelvis bilaterally, as well as in the lower abdominal paracolic gutters bilaterally. Index peritoneal mass in the right adnexa measures 4.7 x 4.9 cm on image 113 of series 201.  Vascular/Lymphatic: Mild retroperitoneal lymphadenopathy seen in the left paraaortic region measuring 1.6 cm on image 69 of  series 201.  Reproductive:  Normal uterus.  Other:  None.  Musculoskeletal:  No suspicious bone lesions identified.  IMPRESSION: Annular constricting rectosigmoid colon carcinoma measuring approximately 7 cm in length and causing partial colonic obstruction.  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  Diffuse liver metastases.  Mild left paraaortic retroperitoneal lymphadenopathy, consistent with metastatic disease.  No evidence of extrathoracic metastatic disease.   Electronically Signed   By: Earle Gell M.D.   On: 06/29/2014 19:09   Ct Abdomen Pelvis W Contrast  06/29/2014   CLINICAL DATA:  Newly diagnosed colon carcinoma.  Crohn's disease.  EXAM: CT CHEST, ABDOMEN, AND PELVIS WITH CONTRAST  TECHNIQUE: Multidetector CT imaging of the chest, abdomen and pelvis was performed following the standard protocol during bolus administration of intravenous contrast.  CONTRAST:  124mL OMNIPAQUE IOHEXOL 300 MG/ML  SOLN  COMPARISON:  None.  FINDINGS: CT CHEST FINDINGS  Mediastinum/Lymph Nodes: No masses or pathologically enlarged lymph nodes identified. 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  Lungs/Pleura: No pulmonary infiltrate or mass identified. No effusion present.  Musculoskeletal/Soft Tissues: No suspicious bone lesions or other significant chest wall abnormality.  CT ABDOMEN AND PELVIS FINDINGS  Hepatobiliary: Numerous hypovascular masses are seen involving the liver diffusely throughout both the right and left hepatic lobes. These are consistent with liver metastases. Largest index mass in the lateral segment of the left lobe measures 6.5 x 8.8 cm on image 62/series 201. Gallbladder is unremarkable. No evidence of biliary dilatation.  Pancreas: No mass, inflammatory changes, or other significant abnormality identified.  Spleen:  Within normal limits in size and appearance.  Adrenals:  No masses identified.   Kidneys/Urinary Tract:  No evidence of masses or hydronephrosis.  Stomach/Bowel/Peritoneum: Annular constricting mass is seen involving the distal sigmoid colon rectum which measures approximately 7 cm in length. There is mild proximal colonic dilatation. This is consistent with an obstructing colon carcinoma.  In addition, there are multiple ill-defined peritoneal soft tissue masses seen within the right adnexal region and anterior pelvis bilaterally, as well as in the lower abdominal paracolic gutters bilaterally. Index peritoneal mass in the right adnexa measures 4.7 x 4.9 cm on image 113 of series 201.  Vascular/Lymphatic: Mild retroperitoneal lymphadenopathy seen in the left paraaortic region measuring 1.6 cm on image 69 of series 201.  Reproductive:  Normal uterus.  Other:  None.  Musculoskeletal:  No suspicious bone lesions identified.  IMPRESSION: Annular constricting rectosigmoid colon carcinoma measuring approximately 7 cm in length and causing partial colonic obstruction.  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  Diffuse liver metastases.  Mild left paraaortic retroperitoneal lymphadenopathy, consistent with metastatic disease.  No evidence of extrathoracic metastatic disease.   Electronically Signed   By: Earle Gell M.D.   On: 06/29/2014 19:09    Anti-infectives: Anti-infectives    None       Assessment/Plan  1. 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 (pathology pending from c-scope bx) -d/w patient and her husband her CT scan findings and what this means.  I explained to them that this is not a curable disease process.  Our goal is palliation to help her eat from an impending obstruction as well as to have med onc see her to discuss essentially palliative chemo options to help halt this process as able.  They understood and questions were answered -d/w patient and husband as well as Dr. Benson Norway about trying to attempt a  palliative stent.  Dr. Benson Norway will attempt this, this afternoon.  This is quite long (7cm in length) and he is a little unsure he will be able to get a stent through this completely.  If he is unable, then she will need a diverting colostomy. -we will continue to follow the patient. -remain NPO for procedure this afternoon.   LOS: 1 day    Evee Liska E 06/30/2014, 11:43 AM Pager: [phone]

## 2014-06-30 NOTE — Progress Notes (Signed)
INITIAL NUTRITION ASSESSMENT  DOCUMENTATION CODES Per approved criteria  -Not Applicable   INTERVENTION: Resource Breeze po TID, each supplement provides 250 kcal and 9 grams of protein  NUTRITION DIAGNOSIS: Inadequate oral intake related to altered GI function as evidenced by clear liquid diet.   Goal: Pt will meet >90% of estimated nutritional needs  Monitor:  Diet advancement, PO/supplement intake, labs, weight changes, I/O's  Reason for Assessment: MST=2  59 y.o. female  Admitting Dx: <principal problem not specified>  Sydney Morrison is an 59 y.o. Female with a PMH significant for Crohn's disease and recently for a diagnosis of C diff, had sought treatment for intermittent bloody diarrhea. She was diagnosed with C diff and treated appropriately but it didn't resolve her symptoms. Therefore she underwent colonoscopy where an obstructing mass was identified 10cm from the anal verge. Biopsies were taken. She reports concurrent constitutional symptoms of fevers, fatigue, and unexplained weight loss over the past 1-2 months.  ASSESSMENT: Pt directly admitted from her gastroenterologist's office for rectal mass.  Pt was in shower at time of visit. Unable to complete nutrition-focused physical exam at this time.  Pt is currently on a clear liquid diet, with fair tolerance (50% meal completion noted). Per chart review, pt consumes Ensure supplements at home. Noted a 5# (4%) wt loss x 1 month, which is trending toward significant.  Pt awaiting surgical consultation for possible colostomy with diversion.  Pt is at high risk for malnutrition given wt loss and possible malignancy. Will order [employer] supplement for additional nutrition support.  Labs reviewed. Calcium: 8.2, Glucose: 62.   Height: Ht Readings from Last 1 Encounters:  06/29/14 5\' 7"  (1.702 m)    Weight: Wt Readings from Last 1 Encounters:  06/29/14 120 lb (54.432 kg)    Ideal Body Weight: 135#  % Ideal  Body Weight: 89%  Wt Readings from Last 10 Encounters:  06/29/14 120 lb (54.432 kg)  05/31/14 125 lb (56.7 kg)    Usual Body Weight: 125#  % Usual Body Weight: 96%  BMI:  Body mass index is 18.79 kg/(m^2). Normal weight range  Estimated Nutritional Needs: Kcal: 1600-1800 Protein: 70-80 grams Fluid: 1.6-1.8 L  Skin: WDL  Diet Order: Diet clear liquid Room service appropriate?: Yes; Fluid consistency:: Thin  EDUCATION NEEDS: -Education not appropriate at this time   Intake/Output Summary (Last 24 hours) at 06/30/14 0937 Last data filed at 06/30/14 0911  Gross per 24 hour  Intake 2752.5 ml  Output      0 ml  Net 2752.5 ml    Last BM: 06/29/14  Labs:   Recent Labs Lab 06/29/14 1451 06/30/14 0435  NA 139 137  K 3.5 3.6  CL 98 102  CO2 28 22  BUN 16 14  CREATININE 0.65 0.80  CALCIUM 8.9 8.2*  GLUCOSE 88 62*    CBG (last 3)  No results for input(s): GLUCAP in the last 72 hours.  Scheduled Meds: . nicotine  14 mg Transdermal Daily    Continuous Infusions: . sodium chloride 150 mL/hr at 06/30/14 0400    Past Medical History  Diagnosis Date  . Crohn's disease   . Chronic headache     Past Surgical History  Procedure Laterality Date  . Breast surgery    . Uterine fibroid surgery    . Colonoscopy w/ biopsies  06/29/2014    DR HUNG    Lavada Langsam A. Jimmye Norman, RD, LDN, CDE Pager: [phone] After hours Pager: [phone]

## 2014-06-30 NOTE — Consult Note (Signed)
The patient is not seen. I have discussed this case with referring physician, Dr. Benson Norway and Dr. Learta Codding who is our GI oncologist. I recommend general surgery consultation regarding possible diversion colostomy in view of new obstructing lesion in the sigmoid/rectal area. Dr. Learta Codding is in agreement to take over the patient's care but he will not be able to see the patient until Sunday, April 3.  Dr. Benson Norway is okay with the plan of care.

## 2014-06-30 NOTE — Interval H&P Note (Signed)
History and Physical Interval Note:  06/30/2014 3:31 PM  Sydney Morrison  has presented today for surgery, with the diagnosis of Near obstructing rectosigmoid colon mass  The various methods of treatment have been discussed with the patient and family. After consideration of risks, benefits and other options for treatment, the patient has consented to  Procedure(s) with comments: FLEXIBLE SIGMOIDOSCOPY (N/A) [place] (N/A) - with fluro  as a surgical intervention .  The patient's history has been reviewed, patient examined, no change in status, stable for surgery.  I have reviewed the patient's chart and labs.  Questions were answered to the patient's satisfaction.     Diago Haik D

## 2014-06-30 NOTE — Op Note (Signed)
[facility] [place], [id number]   FLEXIBLE SIGMOIDOSCOPY PROCEDURE REPORT  PATIENT: Sydney Morrison, Sydney Morrison  MR#: [id number] BIRTHDATE: Nov 29, 1955 , 25  yrs. old GENDER: female ENDOSCOPIST: Carol Ada, MD REFERRED BY: PROCEDURE DATE:  06/30/2014 PROCEDURE:   Sigmoidoscopy with stent ASA CLASS:   Class II INDICATIONS: Near obstructing colon cancer MEDICATIONS: Versed 6 mg IV and Fentanyl 75 mcg IV  DESCRIPTION OF PROCEDURE:   After the risks benefits and alternatives of the procedure were thoroughly explained, informed consent was obtained.  Digital exam revealed no abnormalities of the rectum. The     endoscope was introduced through the anus  and advanced to the sigmoid colon , The exam was Without limitations. The quality of the prep was The overall prep quality was adequate. .  The instrument was then slowly withdrawn as the mucosa was fully examined.      FINDINGS: Using the pediatric endoscope the malignancy was again identified at 10 cm above the anal verge.  With difficulty the endoscope was able to be maneuvered through the mass and into normal sigmoid colon.  A guidewire was then inserted through the scope and advanced into the descending colon where it coiled. Using fluoroscopy the proximal portion of the mass was labled with an external marker.  A distal marker was placed, but it was overlapping the proximal marker secondary to the positioning of the patient. The distal marker was then removed.  The malignant segment was measured to be approximatelyu 6-7 cm in length.  A 2.2 cm x 9 cm stent was advanced over the guidewire.  It met resistance at the distal margin of the malignancy as there was a sharp angulating and the undeployed stent was not able to flex at this point.  The pediatric endoscope was then inserted to help straighten this section to advance the stent.  This manuver was successful and the stent was advanced beyond  the external marker radiographically.  I then decided to advance the endoscope parallel to the stent to deploy it endoscopically in addition to fluoroscopy.  In the process of advancing the scope there was a secondary small lumen. Grossly there appeared to be a tumor perforation.  The endoscope was ultimately advanced into the normal sigmoid colon and the stent was successfully deployed.  This was endoscopically and radiographically confirmed.    Retroflexion was not performed. The scope was then withdrawn from the patient and the procedure terminated.  COMPLICATIONS: There were no immediate complications.  ENDOSCOPIC IMPRESSION: 1) Successful stent placement. 2) Probable tumor perforation.  RECOMMENDATIONS: 1) Left lateral decubitus x-ray. 2) Surgery notified. 3) Initiate antibiotics.  REPEAT EXAM:  eSigned:  Carol Ada, MD 06/30/2014 4:50 PM   CC:

## 2014-07-01 DIAGNOSIS — R509 Fever, unspecified: Secondary | ICD-10-CM

## 2014-07-01 DIAGNOSIS — C189 Malignant neoplasm of colon, unspecified: Secondary | ICD-10-CM

## 2014-07-01 LAB — CBC
HCT: 26.6 % — ABNORMAL LOW (ref 36.0–46.0)
Hemoglobin: 8.5 g/dL — ABNORMAL LOW (ref 12.0–15.0)
MCH: 28 pg (ref 26.0–34.0)
MCHC: 32 g/dL (ref 30.0–36.0)
MCV: 87.5 fL (ref 78.0–100.0)
PLATELETS: 151 10*3/uL (ref 150–400)
RBC: 3.04 MIL/uL — AB (ref 3.87–5.11)
RDW: 12.9 % (ref 11.5–15.5)
WBC: 11.1 10*3/uL — AB (ref 4.0–10.5)

## 2014-07-01 MED ORDER — KCL IN DEXTROSE-NACL 20-5-0.9 MEQ/L-%-% IV SOLN
INTRAVENOUS | Status: DC
  Administered 2014-07-01 – 2014-07-06 (×14): via INTRAVENOUS
  Filled 2014-07-01 (×19): qty 1000

## 2014-07-01 MED ORDER — SODIUM CHLORIDE 0.9 % IV SOLN: 500.0000 mg | Freq: Three times a day (TID) | INTRAVENOUS | Status: DC

## 2014-07-01 MED ORDER — SODIUM CHLORIDE 0.9 % IV SOLN
250.0000 mg | Freq: Four times a day (QID) | INTRAVENOUS | Status: DC
  Administered 2014-07-01 – 2014-07-04 (×14): 250 mg via INTRAVENOUS
  Filled 2014-07-01 (×18): qty 250

## 2014-07-01 NOTE — Progress Notes (Addendum)
1 Day Post-Op  Subjective: This patient underwent permanent endoluminal stent placement across her rectosigmoid cancer yesterday afternoon. Dr. Benson Norway was concerned there might have been a perforation. Postoperatively her abdominal films showed no free air. We were asked to see her and we recommended bowel rest and broad-spectrum antibiotics.    She has had a quiet night but did have fever to 101.  See says that her abdominal pain has not changed since admission. She is hungry. She is passing flatus but has not had a stool yet. CBC shows hemoglobin drifted down to 8.5, probably due to hydration and the WBC is up to 11,100, only slightly elevated compared to preop.    Care plan discussed with Dr. Carlean Purl.  Objective: Vital signs in last 24 hours: Temp:  [98.2 F (36.8 C)-101.4 F (38.6 C)] 100.3 F (37.9 C) (04/02 0500) Pulse Rate:  [65-82] 82 (04/02 0500) Resp:  [14-37] 17 (04/02 0500) BP: (106-156)/(29-81) 109/38 mmHg (04/02 0500) SpO2:  [95 %-100 %] 98 % (04/02 0500) Last BM Date: 06/30/14  Intake/Output from previous day: 04/01 0701 - 04/02 0700 In: 3582.5 [I.V.:3582.5] Out: -  Intake/Output this shift:    General appearance: Extremely pleasant and alert. Does not appear to be in any distress. Resp: clear to auscultation bilaterally GI: Abdomen is actually quite soft with no guarding or rebound whatsoever. Bowel sounds are active. Subjectively since she says it's a little bit tender everywhere but this is no different than 2 days ago.  Lab Results:   Recent Labs  06/30/14 0435 07/01/14 0700  WBC 9.5 11.1*  HGB 9.0* 8.5*  HCT 28.2* 26.6*  PLT 171 151   BMET  Recent Labs  06/29/14 1451 06/30/14 0435  NA 139 137  K 3.5 3.6  CL 98 102  CO2 28 22  GLUCOSE 88 62*  BUN 16 14  CREATININE 0.65 0.80  CALCIUM 8.9 8.2*   PT/INR No results for input(s): LABPROT, INR in the last 72 hours. ABG No results for input(s): PHART, HCO3 in the last 72 hours.  Invalid input(s):  PCO2, PO2  Studies/Results: Dg Abd 1 View - Kub  06/30/2014   CLINICAL DATA:  Placement of a colonic stent  EXAM: DG C-ARM 61-120 MIN; ABDOMEN - 1 VIEW  FLUOROSCOPY TIME:  Fluoroscopy Time (in minutes and seconds): 3 minutes, 2 seconds  Number of Acquired Images:  2  COMPARISON:  CT scan of today's date  FINDINGS: Two fluoro spot films reveal the presence of a stent in the rectosigmoid.  IMPRESSION: Placement of a rectosigmoid stent for colonic malignancy. No immediate postprocedure complication is demonstrated.   Electronically Signed   By: David  Martinique   On: 06/30/2014 16:56   Ct Chest W Contrast  06/29/2014   CLINICAL DATA:  Newly diagnosed colon carcinoma.  Crohn's disease.  EXAM: CT CHEST, ABDOMEN, AND PELVIS WITH CONTRAST  TECHNIQUE: Multidetector CT imaging of the chest, abdomen and pelvis was performed following the standard protocol during bolus administration of intravenous contrast.  CONTRAST:  121mL OMNIPAQUE IOHEXOL 300 MG/ML  SOLN  COMPARISON:  None.  FINDINGS: CT CHEST FINDINGS  Mediastinum/Lymph Nodes: No masses or pathologically enlarged lymph nodes identified. 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  Lungs/Pleura: No pulmonary infiltrate or mass identified. No effusion present.  Musculoskeletal/Soft Tissues: No suspicious bone lesions or other significant chest wall abnormality.  CT ABDOMEN AND PELVIS FINDINGS  Hepatobiliary: Numerous hypovascular masses are seen involving the liver diffusely throughout  both the right and left hepatic lobes. These are consistent with liver metastases. Largest index mass in the lateral segment of the left lobe measures 6.5 x 8.8 cm on image 62/series 201. Gallbladder is unremarkable. No evidence of biliary dilatation.  Pancreas: No mass, inflammatory changes, or other significant abnormality identified.  Spleen:  Within normal limits in size and appearance.  Adrenals:  No masses identified.   Kidneys/Urinary Tract:  No evidence of masses or hydronephrosis.  Stomach/Bowel/Peritoneum: Annular constricting mass is seen involving the distal sigmoid colon rectum which measures approximately 7 cm in length. There is mild proximal colonic dilatation. This is consistent with an obstructing colon carcinoma.  In addition, there are multiple ill-defined peritoneal soft tissue masses seen within the right adnexal region and anterior pelvis bilaterally, as well as in the lower abdominal paracolic gutters bilaterally. Index peritoneal mass in the right adnexa measures 4.7 x 4.9 cm on image 113 of series 201.  Vascular/Lymphatic: Mild retroperitoneal lymphadenopathy seen in the left paraaortic region measuring 1.6 cm on image 69 of series 201.  Reproductive:  Normal uterus.  Other:  None.  Musculoskeletal:  No suspicious bone lesions identified.  IMPRESSION: Annular constricting rectosigmoid colon carcinoma measuring approximately 7 cm in length and causing partial colonic obstruction.  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  Diffuse liver metastases.  Mild left paraaortic retroperitoneal lymphadenopathy, consistent with metastatic disease.  No evidence of extrathoracic metastatic disease.   Electronically Signed   By: Earle Gell M.D.   On: 06/29/2014 19:09   Ct Abdomen Pelvis W Contrast  06/29/2014   CLINICAL DATA:  Newly diagnosed colon carcinoma.  Crohn's disease.  EXAM: CT CHEST, ABDOMEN, AND PELVIS WITH CONTRAST  TECHNIQUE: Multidetector CT imaging of the chest, abdomen and pelvis was performed following the standard protocol during bolus administration of intravenous contrast.  CONTRAST:  127mL OMNIPAQUE IOHEXOL 300 MG/ML  SOLN  COMPARISON:  None.  FINDINGS: CT CHEST FINDINGS  Mediastinum/Lymph Nodes: No masses or pathologically enlarged lymph nodes identified. 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   Lungs/Pleura: No pulmonary infiltrate or mass identified. No effusion present.  Musculoskeletal/Soft Tissues: No suspicious bone lesions or other significant chest wall abnormality.  CT ABDOMEN AND PELVIS FINDINGS  Hepatobiliary: Numerous hypovascular masses are seen involving the liver diffusely throughout both the right and left hepatic lobes. These are consistent with liver metastases. Largest index mass in the lateral segment of the left lobe measures 6.5 x 8.8 cm on image 62/series 201. Gallbladder is unremarkable. No evidence of biliary dilatation.  Pancreas: No mass, inflammatory changes, or other significant abnormality identified.  Spleen:  Within normal limits in size and appearance.  Adrenals:  No masses identified.  Kidneys/Urinary Tract:  No evidence of masses or hydronephrosis.  Stomach/Bowel/Peritoneum: Annular constricting mass is seen involving the distal sigmoid colon rectum which measures approximately 7 cm in length. There is mild proximal colonic dilatation. This is consistent with an obstructing colon carcinoma.  In addition, there are multiple ill-defined peritoneal soft tissue masses seen within the right adnexal region and anterior pelvis bilaterally, as well as in the lower abdominal paracolic gutters bilaterally. Index peritoneal mass in the right adnexa measures 4.7 x 4.9 cm on image 113 of series 201.  Vascular/Lymphatic: Mild retroperitoneal lymphadenopathy seen in the left paraaortic region measuring 1.6 cm on image 69 of series 201.  Reproductive:  Normal uterus.  Other:  None.  Musculoskeletal:  No suspicious bone lesions identified.  IMPRESSION: Annular constricting rectosigmoid colon carcinoma measuring approximately 7 cm in length and causing partial colonic obstruction.  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  Diffuse liver metastases.  Mild left paraaortic retroperitoneal lymphadenopathy, consistent with metastatic disease.   No evidence of extrathoracic metastatic disease.   Electronically Signed   By: Earle Gell M.D.   On: 06/29/2014 19:09   Dg Abd Portable 1v  06/30/2014   CLINICAL DATA:  Evaluation for possible bowel perforation following sigmoidoscopy and colonic stent placement.  EXAM: PORTABLE ABDOMEN - 1 VIEW  COMPARISON:  Intraoperative fluoroscopic images earlier today. CT chest, abdomen, and pelvis 06/29/2014.  FINDINGS: Two left lateral decubitus abdominal radiographs are provided. No intraperitoneal free air is identified. Residual oral contrast is present in multiple loops of grossly nondilated bowel. A stent is present in the rectosigmoid region.  IMPRESSION: No evidence of intraperitoneal free air.  Rectosigmoid stent.  These results were called by telephone (by Dr. Maryland Pink) at the time of interpretation on 06/30/2014 at 5:05 pm to Dr. Carol Ada , who verbally acknowledged these results.   Electronically Signed   By: Logan Bores   On: 06/30/2014 17:12   Dg C-arm 1-60 Min  06/30/2014   CLINICAL DATA:  Placement of a colonic stent  EXAM: DG C-ARM 61-120 MIN; ABDOMEN - 1 VIEW  FLUOROSCOPY TIME:  Fluoroscopy Time (in minutes and seconds): 3 minutes, 2 seconds  Number of Acquired Images:  2  COMPARISON:  CT scan of today's date  FINDINGS: Two fluoro spot films reveal the presence of a stent in the rectosigmoid.  IMPRESSION: Placement of a rectosigmoid stent for colonic malignancy. No immediate postprocedure complication is demonstrated.   Electronically Signed   By: David  Martinique   On: 06/30/2014 16:56    Anti-infectives: Anti-infectives    Start     Dose/Rate Route Frequency Ordered Stop   07/01/14 0900  imipenem-cilastatin (PRIMAXIN) 250 mg in sodium chloride 0.9 % 100 mL IVPB     250 mg 200 mL/hr over 30 Minutes Intravenous 4 times per day 07/01/14 0825     07/01/14 0815  imipenem-cilastatin (PRIMAXIN) 500 mg in sodium chloride 0.9 % 100 mL IVPB  Status:  Discontinued     500 mg 200 mL/hr over 30 Minutes  Intravenous 3 times per day 07/01/14 0801 07/01/14 0825   06/30/14 2200  cefTRIAXone (ROCEPHIN) 1 g in dextrose 5 % 50 mL IVPB - Premix  Status:  Discontinued     1 g 100 mL/hr over 30 Minutes Intravenous Every 12 hours 06/30/14 1712 06/30/14 1712   06/30/14 1715  metroNIDAZOLE (FLAGYL) IVPB 500 mg  Status:  Discontinued     500 mg 100 mL/hr over 60 Minutes Intravenous Every 8 hours 06/30/14 1712 06/30/14 1712      Assessment/Plan: s/p Procedure(s): FLEXIBLE SIGMOIDOSCOPY COLONIC STENT PLACEMENT  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 There may have been a microperforation. Since she is essentially asymptomatic I think we should treat this clinically with bowel rest and IV antibiotics and those have been started now this morning. If she deteriorates she might need a diverting colostomy and I discussed this with her. Hopefully she will respond to medical management. If she responds, then her impending obstruction has been palliated with the stent and she may never need surgical intervention.  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 Apparently she will follow-up  with Dr. Julieanne Manson regarding management of her systemic disease for disease control  Long history of Crohn's disease.     LOS: 2 days    Jobeth Pangilinan M 07/01/2014

## 2014-07-01 NOTE — Progress Notes (Signed)
   Patient Name: Sydney Morrison Date of Encounter: 07/01/2014, 7:43 AM    Subjective  Fever overnight Tm 101.21F Some right flank pain - more of a soreness - "? If from this bed" Passing blood, tenesmus, + flatus   Objective  BP 109/38 mmHg  Pulse 82  Temp(Src) 100.3 F (37.9 C) (Oral)  Resp 17  Ht 5\' 7"  (1.702 m)  Wt 120 lb (54.432 kg)  BMI 18.79 kg/m2  SpO2 98% Lungs cta abd soft and NT w/ active BS Ext no edema and IV site ol Neuro intact MS   Lab Results  Component Value Date   WBC 11.1* 07/01/2014   HGB 8.5* 07/01/2014   HCT 26.6* 07/01/2014   MCV 87.5 07/01/2014   PLT 151 07/01/2014      Assessment and Plan  Fever after stenting of malignant sigmoid colon stricture WBC 9 to 11  NPO IV Abx - went with Imipenem given allergies NO CT unless continues to have fever or other clinical ?  Discussed w/ Dr. Margit Banda, MD, [facility] [phone] (pager) 07/01/2014 7:43 AM

## 2014-07-02 ENCOUNTER — Encounter (HOSPITAL_COMMUNITY): Payer: Self-pay | Admitting: Radiology

## 2014-07-02 ENCOUNTER — Inpatient Hospital Stay (HOSPITAL_COMMUNITY): Payer: 59

## 2014-07-02 DIAGNOSIS — C20 Malignant neoplasm of rectum: Secondary | ICD-10-CM

## 2014-07-02 DIAGNOSIS — K5090 Crohn's disease, unspecified, without complications: Secondary | ICD-10-CM

## 2014-07-02 DIAGNOSIS — D500 Iron deficiency anemia secondary to blood loss (chronic): Secondary | ICD-10-CM | POA: Diagnosis present

## 2014-07-02 DIAGNOSIS — D649 Anemia, unspecified: Secondary | ICD-10-CM

## 2014-07-02 DIAGNOSIS — C787 Secondary malignant neoplasm of liver and intrahepatic bile duct: Secondary | ICD-10-CM

## 2014-07-02 DIAGNOSIS — C786 Secondary malignant neoplasm of retroperitoneum and peritoneum: Secondary | ICD-10-CM

## 2014-07-02 DIAGNOSIS — R634 Abnormal weight loss: Secondary | ICD-10-CM

## 2014-07-02 LAB — CBC
HCT: 26.2 % — ABNORMAL LOW (ref 36.0–46.0)
Hemoglobin: 8.6 g/dL — ABNORMAL LOW (ref 12.0–15.0)
MCH: 28.4 pg (ref 26.0–34.0)
MCHC: 32.8 g/dL (ref 30.0–36.0)
MCV: 86.5 fL (ref 78.0–100.0)
PLATELETS: 149 10*3/uL — AB (ref 150–400)
RBC: 3.03 MIL/uL — ABNORMAL LOW (ref 3.87–5.11)
RDW: 13 % (ref 11.5–15.5)
WBC: 11.7 10*3/uL — ABNORMAL HIGH (ref 4.0–10.5)

## 2014-07-02 MED ORDER — IOHEXOL 300 MG/ML  SOLN
25.0000 mL | INTRAMUSCULAR | Status: AC
  Administered 2014-07-02 (×2): 25 mL via ORAL

## 2014-07-02 MED ORDER — IOHEXOL 300 MG/ML  SOLN
100.0000 mL | Freq: Once | INTRAMUSCULAR | Status: AC | PRN
  Administered 2014-07-02: 100 mL via INTRAVENOUS

## 2014-07-02 MED ORDER — VITAMIN B-12 1000 MCG PO TABS
1000.0000 ug | ORAL_TABLET | Freq: Every day | ORAL | Status: DC
  Administered 2014-07-02: 1000 ug via ORAL
  Filled 2014-07-02 (×4): qty 1

## 2014-07-02 MED ORDER — PROMETHAZINE HCL 25 MG/ML IJ SOLN
25.0000 mg | INTRAMUSCULAR | Status: DC | PRN
  Administered 2014-07-02: 25 mg via INTRAVENOUS
  Filled 2014-07-02: qty 1

## 2014-07-02 MED ORDER — CLONAZEPAM 1 MG PO TABS
1.0000 mg | ORAL_TABLET | Freq: Every day | ORAL | Status: DC
  Administered 2014-07-02 – 2014-07-09 (×8): 1 mg via ORAL
  Filled 2014-07-02 (×8): qty 1

## 2014-07-02 NOTE — Progress Notes (Signed)
     [facility] Progress Note  Subjective:    Temp 101 yesterday. Has dry heaves, abd pain. Repeat CT ordered earlier today.  Says phenergan makes her "twitchy"--does not want any more antiemetics.   Objective:  Vital signs in last 24 hours: Temp:  [97.5 F (36.4 C)-101.3 F (38.5 C)] 97.5 F (36.4 C) (04/03 1030) Pulse Rate:  [64-77] 75 (04/03 1030) Resp:  [17-19] 19 (04/03 1030) BP: (122-144)/(45-76) 144/76 mmHg (04/03 1030) SpO2:  [97 %-100 %] 100 % (04/03 1030) Last BM Date: 07/01/14 General:   Alert,  Well-developed, in NAD, drinking CT prep Heart:  Regular rate and rhythm; no murmurs Pulm;lungs clear Abdomen:  Soft,mild diffuse tenderness Normal bowel sounds, without guarding, and without rebound.   Extremities:  Without edema. Neurologic: Alert and  oriented x4;  grossly normal neurologically. Psych: Alert and cooperative. Normal mood and affect.  Intake/Output from previous day: 04/02 0701 - 04/03 0700 In: 3313.3 [I.V.:3213.3; IV Piggyback:100] Out: -      Lab Results:  Recent Labs  06/30/14 0435 07/01/14 0700 07/02/14 0605  WBC 9.5 11.1* 11.7*  HGB 9.0* 8.5* 8.6*  HCT 28.2* 26.6* 26.2*  PLT 171 151 149*      ASSESSMENT/PLAN:  59 yo female with stage IV CA of rectosigmoid with liver mets and abnormal LFTS, s/p stenting of malignant colon stricture on 06/30/14. Has had fever, increased abd pain ? Microperforation. CT scan ordered--if with perforationshe will likely need a diverting colostomy and placement of pelvic drains.      LOS: 3 days   Hvozdovic, Vita Barley PA-C 07/02/2014, Pager [phone]  [facility] Attending  I have also seen and assessed the patient and agree with the advanced practitioner's assessment and plan.  CT scan is ok - no perforation. Start clear liquids Continue IV Abx Clonazepam instead of Ambien for sleep CC to recheck anemia and WBC in Am and BMET to check lytes  Gatha Mayer, MD, [facility] [phone] (pager) 07/02/2014 1:18 PM

## 2014-07-02 NOTE — Progress Notes (Signed)
New Hematology/Oncology Consult   Referral MD: Sydney Morrison       Reason for Referral: Rectal cancer    HPI:     Past Medical History  Diagnosis Date  . Crohn's disease   .  ocular migraines    :.    G3 P2  Past Surgical History  Procedure Laterality Date  . Breast surgery    . Uterine fibroid surgery    . Colonoscopy w/ biopsies  06/29/2014    DR HUNG  :   Current facility-administered medications:  .  acetaminophen (TYLENOL) tablet 650 mg, 650 mg, Oral, Q6H PRN, Georganna Skeans, MD, 650 mg at 07/02/14 0612 .  dextrose 5 % and 0.9 % NaCl with KCl 20 mEq/L infusion, , Intravenous, Continuous, Gatha Mayer, MD, Last Rate: 125 mL/hr at 07/02/14 0419 .  imipenem-cilastatin (PRIMAXIN) 250 mg in sodium chloride 0.9 % 100 mL IVPB, 250 mg, Intravenous, 4 times per day, Gatha Mayer, MD, 250 mg at 07/02/14 0607 .  nicotine (NICODERM CQ - dosed in mg/24 hours) patch 14 mg, 14 mg, Transdermal, Daily, Sydney Ada, MD, 14 mg at 07/01/14 0940 .  zolpidem (AMBIEN) tablet 5 mg, 5 mg, Oral, QHS PRN, Sydney Ada, MD, 5 mg at 07/01/14 2312:  . imipenem-cilastatin  250 mg Intravenous 4 times per day  . nicotine  14 mg Transdermal Daily  :  Allergies  Allergen Reactions  . Avelox [Moxifloxacin Hcl In Nacl] Hives  . Codeine Hives  . Dextromethorphan Hives  . Erythromycin Hives  . Flagyl [Metronidazole] Nausea Only  . Penicillins Hives  . Suprep [Na Sulfate-K Sulfate-Mg Sulf] Nausea And Vomiting  :  Family History  Problem Relation Age of Onset  . Hypertension Mother   . Cancer-lung  Father-smoker    :  History   Social History  . Marital Status:  married     Spouse Name: N/A  . Number of Children: N/A  . Years of Education: N/A   Occupational History  . Not on file.   Social History Main Topics  . Smoking status: Current Every Day Smoker -- 0.50 packs/day for 40 years    Types: Cigarettes  . Smokeless tobacco: Never Used  . Alcohol Use: No  . Drug Use: No  .  Sexual Activity: Not on file   She lives with her husband in [place]. She is a Agricultural engineer.   Review of Systems:  Positives include: Intermittent diarrhea and rectal bleeding, abdominal pain, anorexia/weight loss, fecal incontinence after placement of the rectal stent, low grade fever  A complete ROS was otherwise negative.   Physical Exam:  Blood pressure 127/45, pulse 73, temperature 99.6 F (37.6 C), temperature source Oral, resp. rate 19, height 5\' 7"  (1.702 m), weight 120 lb (54.432 kg), SpO2 97 %.  HEENT: Neck without mass Lungs: Clear bilaterally Cardiac: Regular rate and rhythm Abdomen: No hepatomegaly, mildly distended, tender in the low abdomen with palpable fullness in the low abdomen bilaterally  Vascular: No leg edema Lymph nodes: No cervical, supra-clavicular, axillary, or inguinal nodes Neurologic: Alert and oriented, the motor exam appears intact in the upper and lower extremities Skin: No rash Musculoskeletal: No spine tenderness  LABS:   Recent Labs  06/30/14 0435 07/01/14 0700  WBC 9.5 11.1*  HGB 9.0* 8.5*  HCT 28.2* 26.6*  PLT 171 151     Recent Labs  06/29/14 1451 06/30/14 0435  NA 139 137  K 3.5 3.6  CL 98 102  CO2 28 22  GLUCOSE 88 62*  BUN 16 14  CREATININE 0.65 0.80  CALCIUM 8.9 8.2*      RADIOLOGY:  Dg Abd 1 View - Kub  06/30/2014   CLINICAL DATA:  Placement of a colonic stent  EXAM: DG C-ARM 61-120 MIN; ABDOMEN - 1 VIEW  FLUOROSCOPY TIME:  Fluoroscopy Time (in minutes and seconds): 3 minutes, 2 seconds  Number of Acquired Images:  2  COMPARISON:  CT scan of today's date  FINDINGS: Two fluoro spot films reveal the presence of a stent in the rectosigmoid.  IMPRESSION: Placement of a rectosigmoid stent for colonic malignancy. No immediate postprocedure complication is demonstrated.   Electronically Signed   By: David  Martinique   On: 06/30/2014 16:56   Ct Chest W Contrast  06/29/2014   CLINICAL DATA:  Newly diagnosed colon carcinoma.   Crohn's disease.  EXAM: CT CHEST, ABDOMEN, AND PELVIS WITH CONTRAST  TECHNIQUE: Multidetector CT imaging of the chest, abdomen and pelvis was performed following the standard protocol during bolus administration of intravenous contrast.  CONTRAST:  1107mL OMNIPAQUE IOHEXOL 300 MG/ML  SOLN  COMPARISON:  None.  FINDINGS: CT CHEST FINDINGS  Mediastinum/Lymph Nodes: No masses or pathologically enlarged lymph nodes identified. 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  Lungs/Pleura: No pulmonary infiltrate or mass identified. No effusion present.  Musculoskeletal/Soft Tissues: No suspicious bone lesions or other significant chest wall abnormality.  CT ABDOMEN AND PELVIS FINDINGS  Hepatobiliary: Numerous hypovascular masses are seen involving the liver diffusely throughout both the right and left hepatic lobes. These are consistent with liver metastases. Largest index mass in the lateral segment of the left lobe measures 6.5 x 8.8 cm on image 62/series 201. Gallbladder is unremarkable. No evidence of biliary dilatation.  Pancreas: No mass, inflammatory changes, or other significant abnormality identified.  Spleen:  Within normal limits in size and appearance.  Adrenals:  No masses identified.  Kidneys/Urinary Tract:  No evidence of masses or hydronephrosis.  Stomach/Bowel/Peritoneum: Annular constricting mass is seen involving the distal sigmoid colon rectum which measures approximately 7 cm in length. There is mild proximal colonic dilatation. This is consistent with an obstructing colon carcinoma.  In addition, there are multiple ill-defined peritoneal soft tissue masses seen within the right adnexal region and anterior pelvis bilaterally, as well as in the lower abdominal paracolic gutters bilaterally. Index peritoneal mass in the right adnexa measures 4.7 x 4.9 cm on image 113 of series 201.  Vascular/Lymphatic: Mild retroperitoneal lymphadenopathy seen in the left  paraaortic region measuring 1.6 cm on image 69 of series 201.  Reproductive:  Normal uterus.  Other:  None.  Musculoskeletal:  No suspicious bone lesions identified.  IMPRESSION: Annular constricting rectosigmoid colon carcinoma measuring approximately 7 cm in length and causing partial colonic obstruction.  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  Diffuse liver metastases.  Mild left paraaortic retroperitoneal lymphadenopathy, consistent with metastatic disease.  No evidence of extrathoracic metastatic disease.   Electronically Signed   By: Earle Gell M.D.   On: 06/29/2014 19:09   Ct Abdomen Pelvis W Contrast  06/29/2014   CLINICAL DATA:  Newly diagnosed colon carcinoma.  Crohn's disease.  EXAM: CT CHEST, ABDOMEN, AND PELVIS WITH CONTRAST  TECHNIQUE: Multidetector CT imaging of the chest, abdomen and pelvis was performed following the standard protocol during bolus administration of intravenous contrast.  CONTRAST:  199mL OMNIPAQUE IOHEXOL 300 MG/ML  SOLN  COMPARISON:  None.  FINDINGS:  CT CHEST FINDINGS  Mediastinum/Lymph Nodes: No masses or pathologically enlarged lymph nodes identified. 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  Lungs/Pleura: No pulmonary infiltrate or mass identified. No effusion present.  Musculoskeletal/Soft Tissues: No suspicious bone lesions or other significant chest wall abnormality.  CT ABDOMEN AND PELVIS FINDINGS  Hepatobiliary: Numerous hypovascular masses are seen involving the liver diffusely throughout both the right and left hepatic lobes. These are consistent with liver metastases. Largest index mass in the lateral segment of the left lobe measures 6.5 x 8.8 cm on image 62/series 201. Gallbladder is unremarkable. No evidence of biliary dilatation.  Pancreas: No mass, inflammatory changes, or other significant abnormality identified.  Spleen:  Within normal limits in size and  appearance.  Adrenals:  No masses identified.  Kidneys/Urinary Tract:  No evidence of masses or hydronephrosis.  Stomach/Bowel/Peritoneum: Annular constricting mass is seen involving the distal sigmoid colon rectum which measures approximately 7 cm in length. There is mild proximal colonic dilatation. This is consistent with an obstructing colon carcinoma.  In addition, there are multiple ill-defined peritoneal soft tissue masses seen within the right adnexal region and anterior pelvis bilaterally, as well as in the lower abdominal paracolic gutters bilaterally. Index peritoneal mass in the right adnexa measures 4.7 x 4.9 cm on image 113 of series 201.  Vascular/Lymphatic: Mild retroperitoneal lymphadenopathy seen in the left paraaortic region measuring 1.6 cm on image 69 of series 201.  Reproductive:  Normal uterus.  Other:  None.  Musculoskeletal:  No suspicious bone lesions identified.  IMPRESSION: Annular constricting rectosigmoid colon carcinoma measuring approximately 7 cm in length and causing partial colonic obstruction.  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  Diffuse liver metastases.  Mild left paraaortic retroperitoneal lymphadenopathy, consistent with metastatic disease.  No evidence of extrathoracic metastatic disease.   Electronically Signed   By: Earle Gell M.D.   On: 06/29/2014 19:09   Dg Abd Portable 1v  06/30/2014   CLINICAL DATA:  Evaluation for possible bowel perforation following sigmoidoscopy and colonic stent placement.  EXAM: PORTABLE ABDOMEN - 1 VIEW  COMPARISON:  Intraoperative fluoroscopic images earlier today. CT chest, abdomen, and pelvis 06/29/2014.  FINDINGS: Two left lateral decubitus abdominal radiographs are provided. No intraperitoneal free air is identified. Residual oral contrast is present in multiple loops of grossly nondilated bowel. A stent is present in the rectosigmoid region.  IMPRESSION: No evidence of  intraperitoneal free air.  Rectosigmoid stent.  These results were called by telephone (by Dr. Maryland Pink) at the time of interpretation on 06/30/2014 at 5:05 pm to Dr. Carol Morrison , who verbally acknowledged these results.   Electronically Signed   By: Logan Bores   On: 06/30/2014 17:12   Dg C-arm 1-60 Min  06/30/2014   CLINICAL DATA:  Placement of a colonic stent  EXAM: DG C-ARM 61-120 MIN; ABDOMEN - 1 VIEW  FLUOROSCOPY TIME:  Fluoroscopy Time (in minutes and seconds): 3 minutes, 2 seconds  Number of Acquired Images:  2  COMPARISON:  CT scan of today's date  FINDINGS: Two fluoro spot films reveal the presence of a stent in the rectosigmoid.  IMPRESSION: Placement of a rectosigmoid stent for colonic malignancy. No immediate postprocedure complication is demonstrated.   Electronically Signed   By: David  Martinique   On: 06/30/2014 16:56    Assessment and Plan:   1. Rectal cancer  Obstructing mass at 10 cm from the anal verge noted  on a colonoscopy 06/29/2014  CT chest, abdomen and pelvis 06/29/2014 revealed a partially obstructing rectosigmoid mass, widespread peritoneal metastatic disease in the pelvis, and diffuse liver metastases  2. Status post placement of a rectal stent on 06/30/2014, placed on bowel rest and IV antibiotics secondary to a possible microperforation  3. Crohn's disease  4. Anemia  5. Anorexia/weight loss  6. Fever-tumor fever?  Sydney Morrison has been diagnosed with rectal cancer. She appears to have extensive liver metastases and peritoneal implants. I discussed the diagnosis and treatment options with her. I explained the majority of patients with metastatic colorectal cancer do not undergo surgery, but with her obstructive symptoms a diverting colostomy may be required.  We will plan to initiate systemic therapy as an outpatient. She lives in [place] and it may be more convenient for her to receive treatment at [facility]. We will decide this over the next few  days.  Recommendations: 1. Continue management of the rectal obstruction per surgery and gastroenterology 2. Port-A-Cath placement 3. Oncology will continue following her in the hospital and we will schedule an outpatient appointment 4. Biopsy a liver lesion to confirm  metastatic disease and obtain tissue for mutation testing if she is taken to a diverting colostomy procedure  Betsy Coder, MD 07/02/2014, 7:13 AM

## 2014-07-02 NOTE — Progress Notes (Signed)
Patient ID: Sydney Morrison, female   DOB: [birth year], 59 y.o.   MRN: [id number] CT reviewed - no sign of perforation. She is having a lot of loose BMs. Tolerating clears. I answered her questions. Georganna Skeans, MD, MPH, FACS Trauma: [phone] General Surgery: [phone]

## 2014-07-02 NOTE — Progress Notes (Addendum)
2 Days Post-Op  Subjective: Doesn't feel as good. Frustrated and tearful. Says she is having nausea and dry heaves and somewhat increased abdominal pain. Frequent loose stools. Temp 101 yesterday. Afebrile since. Heart rate 73  Objective: Vital signs in last 24 hours: Temp:  [98.5 F (36.9 C)-101.3 F (38.5 C)] 99.4 F (37.4 C) (04/03 0532) Pulse Rate:  [64-77] 73 (04/03 0532) Resp:  [17-19] 19 (04/03 0532) BP: (118-136)/(45-60) 127/45 mmHg (04/03 0532) SpO2:  [97 %-98 %] 97 % (04/03 0532) Last BM Date: 07/01/14  Intake/Output from previous day: 04/02 0701 - 04/03 0700 In: 1294.6 [I.V.:1194.6; IV Piggyback:100] Out: -  Intake/Output this shift:    General appearance: Alert and cooperative. Frustrated and tearful. Minimal distress. Resp: clear to auscultation bilaterally GI: Abdomen is generally soft with active bowel sounds. Subjectively tender but no peritoneal signs.  Lab Results:   Recent Labs  06/30/14 0435 07/01/14 0700  WBC 9.5 11.1*  HGB 9.0* 8.5*  HCT 28.2* 26.6*  PLT 171 151   BMET  Recent Labs  06/29/14 1451 06/30/14 0435  NA 139 137  K 3.5 3.6  CL 98 102  CO2 28 22  GLUCOSE 88 62*  BUN 16 14  CREATININE 0.65 0.80  CALCIUM 8.9 8.2*   PT/INR No results for input(s): LABPROT, INR in the last 72 hours. ABG No results for input(s): PHART, HCO3 in the last 72 hours.  Invalid input(s): PCO2, PO2  Studies/Results: Dg Abd 1 View - Kub  06/30/2014   CLINICAL DATA:  Placement of a colonic stent  EXAM: DG C-ARM 61-120 MIN; ABDOMEN - 1 VIEW  FLUOROSCOPY TIME:  Fluoroscopy Time (in minutes and seconds): 3 minutes, 2 seconds  Number of Acquired Images:  2  COMPARISON:  CT scan of today's date  FINDINGS: Two fluoro spot films reveal the presence of a stent in the rectosigmoid.  IMPRESSION: Placement of a rectosigmoid stent for colonic malignancy. No immediate postprocedure complication is demonstrated.   Electronically Signed   By: David  Martinique   On:  06/30/2014 16:56   Dg Abd Portable 1v  06/30/2014   CLINICAL DATA:  Evaluation for possible bowel perforation following sigmoidoscopy and colonic stent placement.  EXAM: PORTABLE ABDOMEN - 1 VIEW  COMPARISON:  Intraoperative fluoroscopic images earlier today. CT chest, abdomen, and pelvis 06/29/2014.  FINDINGS: Two left lateral decubitus abdominal radiographs are provided. No intraperitoneal free air is identified. Residual oral contrast is present in multiple loops of grossly nondilated bowel. A stent is present in the rectosigmoid region.  IMPRESSION: No evidence of intraperitoneal free air.  Rectosigmoid stent.  These results were called by telephone (by Dr. Maryland Pink) at the time of interpretation on 06/30/2014 at 5:05 pm to Dr. Carol Ada , who verbally acknowledged these results.   Electronically Signed   By: Logan Bores   On: 06/30/2014 17:12   Dg C-arm 1-60 Min  06/30/2014   CLINICAL DATA:  Placement of a colonic stent  EXAM: DG C-ARM 61-120 MIN; ABDOMEN - 1 VIEW  FLUOROSCOPY TIME:  Fluoroscopy Time (in minutes and seconds): 3 minutes, 2 seconds  Number of Acquired Images:  2  COMPARISON:  CT scan of today's date  FINDINGS: Two fluoro spot films reveal the presence of a stent in the rectosigmoid.  IMPRESSION: Placement of a rectosigmoid stent for colonic malignancy. No immediate postprocedure complication is demonstrated.   Electronically Signed   By: David  Martinique   On: 06/30/2014 16:56    Anti-infectives: Anti-infectives  Start     Dose/Rate Route Frequency Ordered Stop   07/01/14 0900  imipenem-cilastatin (PRIMAXIN) 250 mg in sodium chloride 0.9 % 100 mL IVPB     250 mg 200 mL/hr over 30 Minutes Intravenous 4 times per day 07/01/14 0825     07/01/14 0815  imipenem-cilastatin (PRIMAXIN) 500 mg in sodium chloride 0.9 % 100 mL IVPB  Status:  Discontinued     500 mg 200 mL/hr over 30 Minutes Intravenous 3 times per day 07/01/14 0801 07/01/14 0825   06/30/14 2200  cefTRIAXone (ROCEPHIN) 1 g  in dextrose 5 % 50 mL IVPB - Premix  Status:  Discontinued     1 g 100 mL/hr over 30 Minutes Intravenous Every 12 hours 06/30/14 1712 06/30/14 1712   06/30/14 1715  metroNIDAZOLE (FLAGYL) IVPB 500 mg  Status:  Discontinued     500 mg 100 mL/hr over 60 Minutes Intravenous Every 8 hours 06/30/14 1712 06/30/14 1712      Assessment/Plan: s/p Procedure(s): FLEXIBLE SIGMOIDOSCOPY COLONIC STENT PLACEMENT  Fever and worsening symptoms following colonoscopic stenting of malignant colon stricture on 06/30/2014.   There may have been a microperforation. She has been on IV antibiotics for 24 hours. Proceed with CT scan of abdomen and pelvis with contrast  If there is evidence of perforation or leak, she will likely need a diverting colostomy and placement of pelvic drains.  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 Apparently she will follow-up with Dr. Julieanne Manson regarding management of her systemic disease for disease control  Long history of Crohn's disease.    LOS: 3 days    Cherina Dhillon M 07/02/2014

## 2014-07-03 ENCOUNTER — Encounter (HOSPITAL_COMMUNITY): Payer: Self-pay | Admitting: Gastroenterology

## 2014-07-03 LAB — BASIC METABOLIC PANEL
Anion gap: 5 (ref 5–15)
CALCIUM: 7.9 mg/dL — AB (ref 8.4–10.5)
CO2: 25 mmol/L (ref 19–32)
Chloride: 111 mmol/L (ref 96–112)
Creatinine, Ser: 0.5 mg/dL (ref 0.50–1.10)
GFR calc Af Amer: >90 mL/min (ref >90)
GLUCOSE: 116 mg/dL — AB (ref 70–99)
POTASSIUM: 3.6 mmol/L (ref 3.5–5.1)
SODIUM: 141 mmol/L (ref 135–145)

## 2014-07-03 LAB — CBC
HCT: 26.9 % — ABNORMAL LOW (ref 36.0–46.0)
HEMOGLOBIN: 8.6 g/dL — AB (ref 12.0–15.0)
MCH: 27.7 pg (ref 26.0–34.0)
MCHC: 32 g/dL (ref 30.0–36.0)
MCV: 86.8 fL (ref 78.0–100.0)
Platelets: 144 10*3/uL — ABNORMAL LOW (ref 150–400)
RBC: 3.1 MIL/uL — ABNORMAL LOW (ref 3.87–5.11)
RDW: 13.3 % (ref 11.5–15.5)
WBC: 8.7 10*3/uL (ref 4.0–10.5)

## 2014-07-03 MED ORDER — POLYETHYLENE GLYCOL 3350 17 G PO PACK
17.0000 g | PACK | Freq: Every day | ORAL | Status: DC
  Administered 2014-07-03 – 2014-07-06 (×2): 17 g via ORAL
  Filled 2014-07-03 (×2): qty 1

## 2014-07-03 MED ORDER — DOCUSATE SODIUM 100 MG PO CAPS
100.0000 mg | ORAL_CAPSULE | Freq: Two times a day (BID) | ORAL | Status: DC
  Administered 2014-07-03 – 2014-07-07 (×7): 100 mg via ORAL
  Filled 2014-07-03 (×13): qty 1

## 2014-07-03 NOTE — Progress Notes (Signed)
Subjective: Intense RUQ pain last evening, but better now.  Objective: Vital signs in last 24 hours: Temp:  [97.5 F (36.4 C)-99.1 F (37.3 C)] 98 F (36.7 C) (04/04 0630) Pulse Rate:  [66-75] 69 (04/04 0630) Resp:  [17-19] 17 (04/04 0630) BP: (119-144)/(52-76) 120/59 mmHg (04/04 0630) SpO2:  [98 %-100 %] 98 % (04/04 0630) Last BM Date: 07/02/14  Intake/Output from previous day: 04/03 0701 - 04/04 0700 In: 1640 [P.O.:240; I.V.:1000; IV Piggyback:400] Out: -  Intake/Output this shift:    General appearance: alert and no distress GI: mild RUQ pain  Lab Results:  Recent Labs  07/01/14 0700 07/02/14 0605 07/03/14 0438  WBC 11.1* 11.7* 8.7  HGB 8.5* 8.6* 8.6*  HCT 26.6* 26.2* 26.9*  PLT 151 149* 144*   BMET  Recent Labs  07/03/14 0438  NA 141  K 3.6  CL 111  CO2 25  GLUCOSE 116*  BUN <5*  CREATININE 0.50  CALCIUM 7.9*   LFT No results for input(s): PROT, ALBUMIN, AST, ALT, ALKPHOS, BILITOT, BILIDIR, IBILI in the last 72 hours. PT/INR No results for input(s): LABPROT, INR in the last 72 hours. Hepatitis Panel No results for input(s): HEPBSAG, HCVAB, HEPAIGM, HEPBIGM in the last 72 hours. C-Diff No results for input(s): CDIFFTOX in the last 72 hours. Fecal Lactopherrin No results for input(s): FECLLACTOFRN in the last 72 hours.  Studies/Results: Ct Abdomen Pelvis W Contrast  07/02/2014   CLINICAL DATA:  Known history of colon carcinoma, status post stent placement with persistent abdominal pain  EXAM: CT ABDOMEN AND PELVIS WITH CONTRAST  TECHNIQUE: Multidetector CT imaging of the abdomen and pelvis was performed using the standard protocol following bolus administration of intravenous contrast.  CONTRAST:  115mL OMNIPAQUE IOHEXOL 300 MG/ML  SOLN  COMPARISON:  06/29/2014  FINDINGS: Lung bases are free of acute infiltrate or sizable effusion. The liver again demonstrates multiple peripherally enhancing metastatic lesions the largest of which lies within the left  lobe of the liver. These are stable from the recent exam. The spleen, adrenal glands, pancreas, gallbladder and kidneys are stable in appearance from the recent study.  Left retroperitoneal adenopathy is again identified adjacent to the renal hilum stable in appearance. Multiple omental deposits are noted particularly in the low pelvis consistent with metastatic disease also stable from the prior exam. Mild ascites is noted likely related to the omental disease. The degree of ascites has increased slightly in the interval from the prior exam. No free air is seen.  The known colonic mass lesion is again seen in the rectosigmoid region. A stent is now noted traversing the colonic mass. There is a collection of fecal material within the proximal portion of the stent which appears slightly larger than the nominal lumen of the stent although no definitive obstructive changes are seen.  The bladder is well distended.  No bony abnormality is seen.  IMPRESSION: Changes consistent with the known history of colon carcinoma with metastatic disease involving the liver and peritoneum.  Recent stent placement within the colonic lesion. Some fecal material is noted within the proximal portion of the stent which appears larger than the lumen of the stent. This is of uncertain significance. No proximal obstructive changes are noted. No evidence of free air is identified to suggest perforation.  Mild ascites is noted.   Electronically Signed   By: Inez Catalina M.D.   On: 07/02/2014 12:48    Medications:  Scheduled: . clonazePAM  1 mg Oral QHS  . docusate sodium  100 mg Oral BID  . imipenem-cilastatin  250 mg Intravenous 4 times per day  . nicotine  14 mg Transdermal Daily  . polyethylene glycol  17 g Oral Daily  . vitamin B-12  1,000 mcg Oral Daily   Continuous: . dextrose 5 % and 0.9 % NaCl with KCl 20 mEq/L 125 mL/hr at 07/02/14 2004    Assessment/Plan: 1) Stage IV Rectosigmoid colon cancer s/p stent placement. 2)  RUQ pain.   The events over the weekend were noted.  Repeat CT scan was negative for any evidence of perforation.  No further fever and WBC has normalized.  Possible microperforation.  She reports RUQ pain and in the past she recalls having the same pain when she had her work up for Crohn's disease.  She felt that it was secondary to the oral contrast.  It is better at this time.  Currently she has liquid stools.  The stent will open up to 2 cm at maximum diameter, but the stool caliber was noted to be larger during the sigmoidoscopy.  I will start her on stool softeners and a daily dose of Miralax to help soften her stool.  Plan: 1) Continue antibiotics for now unless Surgery feels otherwise. 2) Soft diet an advance as tolerated. 3) Colace and Miralax to help soften stool.   LOS: 4 days   Taesean Reth D 07/03/2014, 7:42 AM

## 2014-07-03 NOTE — Progress Notes (Signed)
IP PROGRESS NOTE  Subjective:   She is having loose bowel movements. Her fever was down last night.  Objective: Vital signs in last 24 hours: Blood pressure 120/59, pulse 69, temperature 98 F (36.7 C), temperature source Oral, resp. rate 17, height 5\' 7"  (1.702 m), weight 120 lb (54.432 kg), SpO2 98 %.  Intake/Output from previous day: 04/03 0701 - 04/04 0700 In: 1640 [P.O.:240; I.V.:1000; IV Piggyback:400] Out: -   Physical examination: Not performed today  Lab Results:  Recent Labs  07/02/14 0605 07/03/14 0438  WBC 11.7* 8.7  HGB 8.6* 8.6*  HCT 26.2* 26.9*  PLT 149* 144*    BMET  Recent Labs  07/03/14 0438  NA 141  K 3.6  CL 111  CO2 25  GLUCOSE 116*  BUN <5*  CREATININE 0.50  CALCIUM 7.9*    Studies/Results: Ct Abdomen Pelvis W Contrast  07/02/2014   CLINICAL DATA:  Known history of colon carcinoma, status post stent placement with persistent abdominal pain  EXAM: CT ABDOMEN AND PELVIS WITH CONTRAST  TECHNIQUE: Multidetector CT imaging of the abdomen and pelvis was performed using the standard protocol following bolus administration of intravenous contrast.  CONTRAST:  131mL OMNIPAQUE IOHEXOL 300 MG/ML  SOLN  COMPARISON:  06/29/2014  FINDINGS: Lung bases are free of acute infiltrate or sizable effusion. The liver again demonstrates multiple peripherally enhancing metastatic lesions the largest of which lies within the left lobe of the liver. These are stable from the recent exam. The spleen, adrenal glands, pancreas, gallbladder and kidneys are stable in appearance from the recent study.  Left retroperitoneal adenopathy is again identified adjacent to the renal hilum stable in appearance. Multiple omental deposits are noted particularly in the low pelvis consistent with metastatic disease also stable from the prior exam. Mild ascites is noted likely related to the omental disease. The degree of ascites has increased slightly in the interval from the prior exam. No  free air is seen.  The known colonic mass lesion is again seen in the rectosigmoid region. A stent is now noted traversing the colonic mass. There is a collection of fecal material within the proximal portion of the stent which appears slightly larger than the nominal lumen of the stent although no definitive obstructive changes are seen.  The bladder is well distended.  No bony abnormality is seen.  IMPRESSION: Changes consistent with the known history of colon carcinoma with metastatic disease involving the liver and peritoneum.  Recent stent placement within the colonic lesion. Some fecal material is noted within the proximal portion of the stent which appears larger than the lumen of the stent. This is of uncertain significance. No proximal obstructive changes are noted. No evidence of free air is identified to suggest perforation.  Mild ascites is noted.   Electronically Signed   By: Inez Catalina M.D.   On: 07/02/2014 12:48    Medications: I have reviewed the patient's current medications.  Assessment/Plan:  1. Rectal cancer  Obstructing mass at 10 cm from the anal verge noted on a colonoscopy 06/29/2014  CT chest, abdomen and pelvis 06/29/2014 revealed a partially obstructing rectosigmoid mass, widespread peritoneal metastatic disease in the pelvis, and diffuse liver metastases  2. Status post placement of a rectal stent on 06/30/2014, placed on bowel rest and IV antibiotics secondary to a possible microperforation  3. Crohn's disease  4. Anemia  5. Anorexia/weight loss  6. Fever-tumor fever?, Microperforation related to the rectal stent?  She is having bowel movements with the  stent in place. We will plan for outpatient chemotherapy.  Recommendations: 1. Management of rectal obstruction per surgery and gastroenterology 2. Place Port-A-Cath per surgical service 3. Outpatient follow-up at the [facility] for a chemotherapy teaching class and office visit next one to 2 weeks  LOS:  4 days   [place], [place]  07/03/2014, 8:15 AM

## 2014-07-03 NOTE — Progress Notes (Signed)
[facility] Progress Note  3 Days Post-Op  Subjective: Pt feels good, no N/V, tolerating clears.  Having loose BM's and flatus.  Advanced to soft diet.  Discussed these options.  Ambulating well.  Would like to go home and get port-a-cath placed later, she doesn't want it to delay her discharge.  Objective: Vital signs in last 24 hours: Temp:  [97.5 F (36.4 C)-99.1 F (37.3 C)] 98 F (36.7 C) (04/04 0630) Pulse Rate:  [66-75] 69 (04/04 0630) Resp:  [17-19] 17 (04/04 0630) BP: (119-144)/(52-76) 120/59 mmHg (04/04 0630) SpO2:  [98 %-100 %] 98 % (04/04 0630) Last BM Date: 07/02/14  Intake/Output from previous day: 04/03 0701 - 04/04 0700 In: 1640 [P.O.:240; I.V.:1000; IV Piggyback:400] Out: -  Intake/Output this shift:    PE: Gen:  Alert, NAD, pleasant Abd: Soft, mildly distended, not very tender, +BS, no HSM  Lab Results:   Recent Labs  07/02/14 0605 07/03/14 0438  WBC 11.7* 8.7  HGB 8.6* 8.6*  HCT 26.2* 26.9*  PLT 149* 144*   BMET  Recent Labs  07/03/14 0438  NA 141  K 3.6  CL 111  CO2 25  GLUCOSE 116*  BUN <5*  CREATININE 0.50  CALCIUM 7.9*   PT/INR No results for input(s): LABPROT, INR in the last 72 hours. CMP     Component Value Date/Time   NA 141 07/03/2014 0438   K 3.6 07/03/2014 0438   CL 111 07/03/2014 0438   CO2 25 07/03/2014 0438   GLUCOSE 116* 07/03/2014 0438   BUN <5* 07/03/2014 0438   CREATININE 0.50 07/03/2014 0438   CALCIUM 7.9* 07/03/2014 0438   PROT 5.7* 06/30/2014 0435   ALBUMIN 2.8* 06/30/2014 0435   AST 37 06/30/2014 0435   ALT 33 06/30/2014 0435   ALKPHOS 141* 06/30/2014 0435   BILITOT 0.8 06/30/2014 0435   GFRNONAA >90 07/03/2014 0438   GFRAA >90 07/03/2014 0438   Lipase  No results found for: LIPASE     Studies/Results: Ct Abdomen Pelvis W Contrast  07/02/2014   CLINICAL DATA:  Known history of colon carcinoma, status post stent placement with persistent abdominal pain  EXAM: CT ABDOMEN AND PELVIS  WITH CONTRAST  TECHNIQUE: Multidetector CT imaging of the abdomen and pelvis was performed using the standard protocol following bolus administration of intravenous contrast.  CONTRAST:  128mL OMNIPAQUE IOHEXOL 300 MG/ML  SOLN  COMPARISON:  06/29/2014  FINDINGS: Lung bases are free of acute infiltrate or sizable effusion. The liver again demonstrates multiple peripherally enhancing metastatic lesions the largest of which lies within the left lobe of the liver. These are stable from the recent exam. The spleen, adrenal glands, pancreas, gallbladder and kidneys are stable in appearance from the recent study.  Left retroperitoneal adenopathy is again identified adjacent to the renal hilum stable in appearance. Multiple omental deposits are noted particularly in the low pelvis consistent with metastatic disease also stable from the prior exam. Mild ascites is noted likely related to the omental disease. The degree of ascites has increased slightly in the interval from the prior exam. No free air is seen.  The known colonic mass lesion is again seen in the rectosigmoid region. A stent is now noted traversing the colonic mass. There is a collection of fecal material within the proximal portion of the stent which appears slightly larger than the nominal lumen of the stent although no definitive obstructive changes are seen.  The bladder is well distended.  No bony abnormality is seen.  IMPRESSION: Changes consistent with the known history of colon carcinoma with metastatic disease involving the liver and peritoneum.  Recent stent placement within the colonic lesion. Some fecal material is noted within the proximal portion of the stent which appears larger than the lumen of the stent. This is of uncertain significance. No proximal obstructive changes are noted. No evidence of free air is identified to suggest perforation.  Mild ascites is noted.   Electronically Signed   By: Sydney Morrison M.D.   On: 07/02/2014 12:48     Anti-infectives: Anti-infectives    Start     Dose/Rate Route Frequency Ordered Stop   07/01/14 0900  imipenem-cilastatin (PRIMAXIN) 250 mg in sodium chloride 0.9 % 100 mL IVPB     250 mg 200 mL/hr over 30 Minutes Intravenous 4 times per day 07/01/14 0825     07/01/14 0815  imipenem-cilastatin (PRIMAXIN) 500 mg in sodium chloride 0.9 % 100 mL IVPB  Status:  Discontinued     500 mg 200 mL/hr over 30 Minutes Intravenous 3 times per day 07/01/14 0801 07/01/14 0825   06/30/14 2200  cefTRIAXone (ROCEPHIN) 1 g in dextrose 5 % 50 mL IVPB - Premix  Status:  Discontinued     1 g 100 mL/hr over 30 Minutes Intravenous Every 12 hours 06/30/14 1712 06/30/14 1712   06/30/14 1715  metroNIDAZOLE (FLAGYL) IVPB 500 mg  Status:  Discontinued     500 mg 100 mL/hr over 60 Minutes Intravenous Every 8 hours 06/30/14 1712 06/30/14 1712       Assessment/Plan Fever and worsening symptoms following colonoscopic stenting of malignant colon stricture on 06/30/2014.   -Fevers resolved and WBC normal  -There may have been a microperforation, but no perforation on CT, started on clears yesterday afternoon  -She has been on IV antibiotics for 48 hours, Dr. Benson Morrison would like her to continue secondary to concern for perforation during stent placement for one more day  -Dr. Benson Morrison advanced to soft diet  -Having loose BM's now, but has slowed down, Dr. 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  -Follow-up with Dr. Julieanne Morrison regarding management of her systemic disease for disease control  -Dr. Ammie Morrison recommends [facility] - will discuss the timing of this with Dr. Lucia Morrison.  She would prefer doing this as an outpatient.  She's requesting Dr. Dalbert Morrison.  They are also recommending liver biopsy - IR can likely do this.  Long history of Crohn's  disease.    LOS: 4 days    Sydney Morrison 07/03/2014, 8:17 AM Pager: [phone]  Agree with above. Husband in room.  I offered to place power port during this hospitalization, but she is intent on going home.  I discussed the indications and potential complications of the power port placement.  The primary complications of the power port, include, but are not limited to, bleeding, infection, nerve injury, thrombosis, and pneumothorax. From our standpoint home, probably home in AM.  Alphonsa Overall, MD, [facility] Surgery Pager: [phone] Office phone:  [phone]

## 2014-07-04 ENCOUNTER — Encounter (HOSPITAL_COMMUNITY)
Admission: AD | Disposition: A | Discharge status: Home / Self Care | Payer: Self-pay | Source: Ambulatory Visit | Attending: Gastroenterology

## 2014-07-04 ENCOUNTER — Inpatient Hospital Stay (HOSPITAL_COMMUNITY): Payer: 59 | Admitting: Certified Registered"

## 2014-07-04 ENCOUNTER — Encounter (HOSPITAL_COMMUNITY): Payer: Self-pay | Admitting: Radiology

## 2014-07-04 ENCOUNTER — Inpatient Hospital Stay (HOSPITAL_COMMUNITY): Payer: 59

## 2014-07-04 DIAGNOSIS — R630 Anorexia: Secondary | ICD-10-CM

## 2014-07-04 HISTORY — PX: PORTACATH PLACEMENT: SHX2246

## 2014-07-04 SURGERY — INSERTION, TUNNELED CENTRAL VENOUS DEVICE, WITH PORT
Anesthesia: General | Site: Chest

## 2014-07-04 MED ORDER — LACTATED RINGERS IV SOLN
INTRAVENOUS | Status: DC | PRN
  Administered 2014-07-04: 12:00:00 via INTRAVENOUS

## 2014-07-04 MED ORDER — PROPOFOL 10 MG/ML IV BOLUS
INTRAVENOUS | Status: AC
  Filled 2014-07-04: qty 20

## 2014-07-04 MED ORDER — LIDOCAINE HCL (PF) 1 % IJ SOLN
INTRAMUSCULAR | Status: DC | PRN
  Administered 2014-07-04: 30 mL

## 2014-07-04 MED ORDER — MIDAZOLAM HCL 2 MG/2ML IJ SOLN
INTRAMUSCULAR | Status: AC
  Filled 2014-07-04: qty 2

## 2014-07-04 MED ORDER — ARTIFICIAL TEARS OP OINT
TOPICAL_OINTMENT | OPHTHALMIC | Status: AC
  Filled 2014-07-04: qty 3.5

## 2014-07-04 MED ORDER — SODIUM CHLORIDE 0.9 % IR SOLN
Status: DC | PRN
  Administered 2014-07-04: 500 mL

## 2014-07-04 MED ORDER — LIDOCAINE HCL (CARDIAC) 20 MG/ML IV SOLN
INTRAVENOUS | Status: DC | PRN
  Administered 2014-07-04: 8 mg via INTRAVENOUS

## 2014-07-04 MED ORDER — FENTANYL CITRATE 0.05 MG/ML IJ SOLN
INTRAMUSCULAR | Status: AC
  Filled 2014-07-04: qty 5

## 2014-07-04 MED ORDER — CALCIUM CARBONATE ANTACID 500 MG PO CHEW
1.0000 | CHEWABLE_TABLET | Freq: Three times a day (TID) | ORAL | Status: DC | PRN
  Administered 2014-07-04: 400 mg via ORAL
  Filled 2014-07-04: qty 1

## 2014-07-04 MED ORDER — OXYCODONE HCL 5 MG/5ML PO SOLN: 5.0000 mg | Freq: Once | ORAL | Status: DC | PRN

## 2014-07-04 MED ORDER — PHENYLEPHRINE HCL 10 MG/ML IJ SOLN
INTRAMUSCULAR | Status: DC | PRN
  Administered 2014-07-04: 40 ug via INTRAVENOUS

## 2014-07-04 MED ORDER — FENTANYL CITRATE 0.05 MG/ML IJ SOLN
INTRAMUSCULAR | Status: DC | PRN
  Administered 2014-07-04: 25 ug via INTRAVENOUS
  Administered 2014-07-04: 50 ug via INTRAVENOUS

## 2014-07-04 MED ORDER — HEPARIN SOD (PORK) LOCK FLUSH 100 UNIT/ML IV SOLN
INTRAVENOUS | Status: AC
  Filled 2014-07-04: qty 5

## 2014-07-04 MED ORDER — LIDOCAINE HCL (CARDIAC) 20 MG/ML IV SOLN
INTRAVENOUS | Status: AC
  Filled 2014-07-04: qty 5

## 2014-07-04 MED ORDER — FENTANYL CITRATE 0.05 MG/ML IJ SOLN: 25.0000 ug | INTRAMUSCULAR | Status: DC | PRN

## 2014-07-04 MED ORDER — ONDANSETRON HCL 4 MG/2ML IJ SOLN
INTRAMUSCULAR | Status: AC
  Filled 2014-07-04: qty 2

## 2014-07-04 MED ORDER — FLEET ENEMA 7-19 GM/118ML RE ENEM
1.0000 | ENEMA | Freq: Once | RECTAL | Status: DC
  Filled 2014-07-04: qty 1

## 2014-07-04 MED ORDER — HEPARIN SOD (PORK) LOCK FLUSH 100 UNIT/ML IV SOLN
INTRAVENOUS | Status: DC | PRN
  Administered 2014-07-04: 500 [IU] via INTRAVENOUS

## 2014-07-04 MED ORDER — ONDANSETRON HCL 4 MG/2ML IJ SOLN: 4.0000 mg | Freq: Four times a day (QID) | INTRAMUSCULAR | Status: DC | PRN

## 2014-07-04 MED ORDER — ONDANSETRON HCL 4 MG/2ML IJ SOLN
INTRAMUSCULAR | Status: DC | PRN
  Administered 2014-07-04: 4 mg via INTRAVENOUS

## 2014-07-04 MED ORDER — HYDROCODONE-ACETAMINOPHEN 5-325 MG PO TABS
1.0000 | ORAL_TABLET | ORAL | Status: DC | PRN
Start: 2014-07-04 — End: 2014-07-11

## 2014-07-04 MED ORDER — LIDOCAINE HCL (PF) 1 % IJ SOLN
INTRAMUSCULAR | Status: AC
  Filled 2014-07-04: qty 30

## 2014-07-04 MED ORDER — PROPOFOL 10 MG/ML IV BOLUS
INTRAVENOUS | Status: DC | PRN
  Administered 2014-07-04: 150 mg via INTRAVENOUS

## 2014-07-04 MED ORDER — MIDAZOLAM HCL 5 MG/5ML IJ SOLN
INTRAMUSCULAR | Status: DC | PRN
  Administered 2014-07-04: 2 mg via INTRAVENOUS

## 2014-07-04 MED ORDER — OXYCODONE HCL 5 MG PO TABS: 5.0000 mg | ORAL_TABLET | Freq: Once | ORAL | Status: DC | PRN

## 2014-07-04 SURGICAL SUPPLY — 48 items
BAG DECANTER FOR FLEXI CONT (MISCELLANEOUS) ×3 IMPLANT
BENZOIN TINCTURE PRP APPL 2/3 (GAUZE/BANDAGES/DRESSINGS) ×3 IMPLANT
BLADE SURG 11 STRL SS (BLADE) ×3 IMPLANT
BLADE SURG 15 STRL LF DISP TIS (BLADE) ×1 IMPLANT
BLADE SURG 15 STRL SS (BLADE) ×2
CHLORAPREP W/TINT 10.5 ML (MISCELLANEOUS) ×3 IMPLANT
CLOSURE WOUND 1/2 X4 (GAUZE/BANDAGES/DRESSINGS) ×1
CLOSURE WOUND 1/4X4 (GAUZE/BANDAGES/DRESSINGS) ×1
COVER SURGICAL LIGHT HANDLE (MISCELLANEOUS) ×3 IMPLANT
COVER TRANSDUCER ULTRASND GEL (DRAPE) IMPLANT
CRADLE DONUT ADULT HEAD (MISCELLANEOUS) ×3 IMPLANT
DRAPE C-ARM 42X72 X-RAY (DRAPES) ×3 IMPLANT
DRAPE CHEST BREAST 15X10 FENES (DRAPES) ×3 IMPLANT
DRAPE UTILITY XL STRL (DRAPES) ×6 IMPLANT
ELECT CAUTERY BLADE 6.4 (BLADE) ×3 IMPLANT
ELECT REM PT RETURN 9FT ADLT (ELECTROSURGICAL) ×3
ELECTRODE REM PT RTRN 9FT ADLT (ELECTROSURGICAL) ×1 IMPLANT
GAUZE SPONGE 4X4 12PLY STRL (GAUZE/BANDAGES/DRESSINGS) ×3 IMPLANT
GAUZE SPONGE 4X4 16PLY XRAY LF (GAUZE/BANDAGES/DRESSINGS) ×3 IMPLANT
GEL ULTRASOUND 20GR AQUASONIC (MISCELLANEOUS) IMPLANT
GLOVE BIOGEL PI IND STRL 8 (GLOVE) ×1 IMPLANT
GLOVE BIOGEL PI INDICATOR 8 (GLOVE) ×2
GLOVE SURG SIGNA 7.5 PF LTX (GLOVE) ×3 IMPLANT
GOWN STRL REUS W/ TWL LRG LVL3 (GOWN DISPOSABLE) ×1 IMPLANT
GOWN STRL REUS W/ TWL XL LVL3 (GOWN DISPOSABLE) ×1 IMPLANT
GOWN STRL REUS W/TWL LRG LVL3 (GOWN DISPOSABLE) ×2
GOWN STRL REUS W/TWL XL LVL3 (GOWN DISPOSABLE) ×2
INTRODUCER 13FR (MISCELLANEOUS) IMPLANT
KIT BASIN OR (CUSTOM PROCEDURE TRAY) ×3 IMPLANT
KIT PORT POWER 9.6FR MRI PREA (Catheter) ×3 IMPLANT
KIT ROOM TURNOVER OR (KITS) ×3 IMPLANT
NEEDLE HYPO 25GX1X1/2 BEV (NEEDLE) ×3 IMPLANT
NS IRRIG 1000ML POUR BTL (IV SOLUTION) ×3 IMPLANT
PACK SURGICAL SETUP 50X90 (CUSTOM PROCEDURE TRAY) ×3 IMPLANT
PAD ARMBOARD 7.5X6 YLW CONV (MISCELLANEOUS) ×3 IMPLANT
PENCIL BUTTON HOLSTER BLD 10FT (ELECTRODE) ×3 IMPLANT
SET SHEATH INTRODUCER 10FR (MISCELLANEOUS) IMPLANT
SHEATH COOK PEEL AWAY SET 9F (SHEATH) IMPLANT
SPONGE GAUZE 4X4 12PLY STER LF (GAUZE/BANDAGES/DRESSINGS) ×3 IMPLANT
STRIP CLOSURE SKIN 1/2X4 (GAUZE/BANDAGES/DRESSINGS) ×2 IMPLANT
STRIP CLOSURE SKIN 1/4X4 (GAUZE/BANDAGES/DRESSINGS) ×2 IMPLANT
SUT MNCRL AB 4-0 PS2 18 (SUTURE) ×3 IMPLANT
SUT VIC AB 3-0 SH 18 (SUTURE) ×3 IMPLANT
SYR 20ML ECCENTRIC (SYRINGE) ×6 IMPLANT
SYR 5ML LUER SLIP (SYRINGE) ×3 IMPLANT
SYR CONTROL 10ML LL (SYRINGE) ×3 IMPLANT
TOWEL OR 17X24 6PK STRL BLUE (TOWEL DISPOSABLE) ×3 IMPLANT
TOWEL OR 17X26 10 PK STRL BLUE (TOWEL DISPOSABLE) ×3 IMPLANT

## 2014-07-04 NOTE — Progress Notes (Signed)
Patient sent to OR for procedure. Report called to short stay and given to [facility].

## 2014-07-04 NOTE — Consult Note (Signed)
Chief Complaint: Abd pain  Referring Physician(s): Dr Benay Spice  History of Present Illness: Sydney Morrison is a 59 y.o. female   Pt with new colon ca Hx Chron's dz Diarrhea; bloody Treated with Vanco po--no help Colonoscopy in Dr Benson Norway office +rectosigmoid mass Bx: + cancer Work up reveals liver metastasis Rectal stent placed in OR 06/30/14 Now request for liver lesion biopsy per Dr Benay Spice Dr Pascal Lux has reviewed imaging and approves procedure I have seen and examined pt Scheduled for bx 4/6 (having PAC today in OR)   Past Medical History  Diagnosis Date  . Crohn's disease   . Chronic headache   . Cancer     Past Surgical History  Procedure Laterality Date  . Breast surgery    . Uterine fibroid surgery    . Colonoscopy w/ biopsies  06/29/2014    DR HUNG  . Flexible sigmoidoscopy N/A 06/30/2014    Procedure: FLEXIBLE SIGMOIDOSCOPY;  Surgeon: Carol Ada, MD;  Location: [facility] ENDOSCOPY;  Service: Endoscopy;  Laterality: N/A;  . Colonic stent placement N/A 06/30/2014    Procedure: COLONIC STENT PLACEMENT;  Surgeon: Carol Ada, MD;  Location: [facility] ENDOSCOPY;  Service: Endoscopy;  Laterality: N/A;  with fluro     Allergies: Avelox; Codeine; Dextromethorphan; Erythromycin; Flagyl; Penicillins; and Suprep  Medications: Prior to Admission medications   Medication Sig Start Date End Date Taking? Authorizing Provider  acetaminophen (TYLENOL) 500 MG tablet Take 500 mg by mouth every 6 (six) hours as needed for mild pain.    Yes Historical Provider, MD  cromolyn (NASALCROM) 5.2 MG/ACT nasal spray Place 1 spray into both nostrils at bedtime.   Yes Historical Provider, MD  loperamide (IMODIUM) 2 MG capsule Take 2 mg by mouth as needed for diarrhea or loose stools.   Yes Historical Provider, MD  Cyanocobalamin 1500 MCG TBDP Take 1 tablet by mouth daily.    Historical Provider, MD  Magnesium 250 MG TABS Take 1 tablet by mouth daily.    Historical Provider, MD  meclizine (ANTIVERT)  25 MG tablet Take 1 tablet (25 mg total) by mouth 3 (three) times daily as needed for dizziness. Patient not taking: Reported on 06/29/2014 05/31/14   Francine Graven, DO  ondansetron (ZOFRAN ODT) 4 MG disintegrating tablet Take 1 tablet (4 mg total) by mouth every 8 (eight) hours as needed for nausea or vomiting. Patient not taking: Reported on 06/29/2014 05/31/14   Francine Graven, DO     Family History  Problem Relation Age of Onset  . Hypertension Mother   . Cancer Father     History   Social History  . Marital Status: Single    Spouse Name: N/A  . Number of Children: N/A  . Years of Education: N/A   Social History Main Topics  . Smoking status: Current Every Day Smoker -- 0.50 packs/day for 40 years    Types: Cigarettes  . Smokeless tobacco: Never Used  . Alcohol Use: No  . Drug Use: No  . Sexual Activity: Not on file   Other Topics Concern  . None   Social History Narrative     Review of Systems: A 12 point ROS discussed and pertinent positives are indicated in the HPI above.  All other systems are negative.  Review of Systems  Constitutional: Positive for activity change, appetite change and unexpected weight change.  Respiratory: Negative for shortness of breath.   Gastrointestinal: Positive for nausea, diarrhea, blood in stool and rectal pain. Negative for abdominal pain.  Neurological: Positive for weakness.  Psychiatric/Behavioral: Negative for behavioral problems and confusion.    Vital Signs: BP 127/55 mmHg  Pulse 79  Temp(Src) 99.1 F (37.3 C) (Oral)  Resp 17  Ht 5\' 7"  (1.702 m)  Wt 54.432 kg (120 lb)  BMI 18.79 kg/m2  SpO2 95%  Physical Exam  Constitutional: She is oriented to person, place, and time. She appears well-developed.  Cardiovascular: Normal rate, regular rhythm and normal heart sounds.   No murmur heard. Pulmonary/Chest: Effort normal and breath sounds normal. No respiratory distress. She has no wheezes.  Abdominal: Soft. Bowel  sounds are normal. There is tenderness.  Musculoskeletal: Normal range of motion.  Neurological: She is alert and oriented to person, place, and time.  Skin: Skin is warm and dry.  Psychiatric: She has a normal mood and affect. Her behavior is normal. Judgment and thought content normal.  Nursing note and vitals reviewed.   Mallampati Score:  MD Evaluation Airway: WNL Heart: WNL Abdomen: WNL Chest/ Lungs: WNL ASA  Classification: 3 Mallampati/Airway Score: One  Imaging: Dg Abd 1 View - Kub  06/30/2014   CLINICAL DATA:  Placement of a colonic stent  EXAM: DG C-ARM 61-120 MIN; ABDOMEN - 1 VIEW  FLUOROSCOPY TIME:  Fluoroscopy Time (in minutes and seconds): 3 minutes, 2 seconds  Number of Acquired Images:  2  COMPARISON:  CT scan of today's date  FINDINGS: Two fluoro spot films reveal the presence of a stent in the rectosigmoid.  IMPRESSION: Placement of a rectosigmoid stent for colonic malignancy. No immediate postprocedure complication is demonstrated.   Electronically Signed   By: David  Martinique   On: 06/30/2014 16:56   Ct Chest W Contrast  06/29/2014   CLINICAL DATA:  Newly diagnosed colon carcinoma.  Crohn's disease.  EXAM: CT CHEST, ABDOMEN, AND PELVIS WITH CONTRAST  TECHNIQUE: Multidetector CT imaging of the chest, abdomen and pelvis was performed following the standard protocol during bolus administration of intravenous contrast.  CONTRAST:  128mL OMNIPAQUE IOHEXOL 300 MG/ML  SOLN  COMPARISON:  None.  FINDINGS: CT CHEST FINDINGS  Mediastinum/Lymph Nodes: No masses or pathologically enlarged lymph nodes identified. 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  Lungs/Pleura: No pulmonary infiltrate or mass identified. No effusion present.  Musculoskeletal/Soft Tissues: No suspicious bone lesions or other significant chest wall abnormality.  CT ABDOMEN AND PELVIS FINDINGS  Hepatobiliary: Numerous hypovascular masses are seen involving the liver  diffusely throughout both the right and left hepatic lobes. These are consistent with liver metastases. Largest index mass in the lateral segment of the left lobe measures 6.5 x 8.8 cm on image 62/series 201. Gallbladder is unremarkable. No evidence of biliary dilatation.  Pancreas: No mass, inflammatory changes, or other significant abnormality identified.  Spleen:  Within normal limits in size and appearance.  Adrenals:  No masses identified.  Kidneys/Urinary Tract:  No evidence of masses or hydronephrosis.  Stomach/Bowel/Peritoneum: Annular constricting mass is seen involving the distal sigmoid colon rectum which measures approximately 7 cm in length. There is mild proximal colonic dilatation. This is consistent with an obstructing colon carcinoma.  In addition, there are multiple ill-defined peritoneal soft tissue masses seen within the right adnexal region and anterior pelvis bilaterally, as well as in the lower abdominal paracolic gutters bilaterally. Index peritoneal mass in the right adnexa measures 4.7 x 4.9 cm on image 113 of series 201.  Vascular/Lymphatic: Mild retroperitoneal lymphadenopathy seen in the left paraaortic region measuring 1.6 cm on image  69 of series 201.  Reproductive:  Normal uterus.  Other:  None.  Musculoskeletal:  No suspicious bone lesions identified.  IMPRESSION: Annular constricting rectosigmoid colon carcinoma measuring approximately 7 cm in length and causing partial colonic obstruction.  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  Diffuse liver metastases.  Mild left paraaortic retroperitoneal lymphadenopathy, consistent with metastatic disease.  No evidence of extrathoracic metastatic disease.   Electronically Signed   By: Earle Gell M.D.   On: 06/29/2014 19:09   Ct Abdomen Pelvis W Contrast  07/02/2014   CLINICAL DATA:  Known history of colon carcinoma, status post stent placement with persistent abdominal pain  EXAM: CT  ABDOMEN AND PELVIS WITH CONTRAST  TECHNIQUE: Multidetector CT imaging of the abdomen and pelvis was performed using the standard protocol following bolus administration of intravenous contrast.  CONTRAST:  147mL OMNIPAQUE IOHEXOL 300 MG/ML  SOLN  COMPARISON:  06/29/2014  FINDINGS: Lung bases are free of acute infiltrate or sizable effusion. The liver again demonstrates multiple peripherally enhancing metastatic lesions the largest of which lies within the left lobe of the liver. These are stable from the recent exam. The spleen, adrenal glands, pancreas, gallbladder and kidneys are stable in appearance from the recent study.  Left retroperitoneal adenopathy is again identified adjacent to the renal hilum stable in appearance. Multiple omental deposits are noted particularly in the low pelvis consistent with metastatic disease also stable from the prior exam. Mild ascites is noted likely related to the omental disease. The degree of ascites has increased slightly in the interval from the prior exam. No free air is seen.  The known colonic mass lesion is again seen in the rectosigmoid region. A stent is now noted traversing the colonic mass. There is a collection of fecal material within the proximal portion of the stent which appears slightly larger than the nominal lumen of the stent although no definitive obstructive changes are seen.  The bladder is well distended.  No bony abnormality is seen.  IMPRESSION: Changes consistent with the known history of colon carcinoma with metastatic disease involving the liver and peritoneum.  Recent stent placement within the colonic lesion. Some fecal material is noted within the proximal portion of the stent which appears larger than the lumen of the stent. This is of uncertain significance. No proximal obstructive changes are noted. No evidence of free air is identified to suggest perforation.  Mild ascites is noted.   Electronically Signed   By: Inez Catalina M.D.   On:  07/02/2014 12:48   Ct Abdomen Pelvis W Contrast  06/29/2014   CLINICAL DATA:  Newly diagnosed colon carcinoma.  Crohn's disease.  EXAM: CT CHEST, ABDOMEN, AND PELVIS WITH CONTRAST  TECHNIQUE: Multidetector CT imaging of the chest, abdomen and pelvis was performed following the standard protocol during bolus administration of intravenous contrast.  CONTRAST:  136mL OMNIPAQUE IOHEXOL 300 MG/ML  SOLN  COMPARISON:  None.  FINDINGS: CT CHEST FINDINGS  Mediastinum/Lymph Nodes: No masses or pathologically enlarged lymph nodes identified. 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  Lungs/Pleura: No pulmonary infiltrate or mass identified. No effusion present.  Musculoskeletal/Soft Tissues: No suspicious bone lesions or other significant chest wall abnormality.  CT ABDOMEN AND PELVIS FINDINGS  Hepatobiliary: Numerous hypovascular masses are seen involving the liver diffusely throughout both the right and left hepatic lobes. These are consistent with liver metastases. Largest index mass in the lateral segment of the left  lobe measures 6.5 x 8.8 cm on image 62/series 201. Gallbladder is unremarkable. No evidence of biliary dilatation.  Pancreas: No mass, inflammatory changes, or other significant abnormality identified.  Spleen:  Within normal limits in size and appearance.  Adrenals:  No masses identified.  Kidneys/Urinary Tract:  No evidence of masses or hydronephrosis.  Stomach/Bowel/Peritoneum: Annular constricting mass is seen involving the distal sigmoid colon rectum which measures approximately 7 cm in length. There is mild proximal colonic dilatation. This is consistent with an obstructing colon carcinoma.  In addition, there are multiple ill-defined peritoneal soft tissue masses seen within the right adnexal region and anterior pelvis bilaterally, as well as in the lower abdominal paracolic gutters bilaterally. Index peritoneal mass in the right adnexa measures 4.7  x 4.9 cm on image 113 of series 201.  Vascular/Lymphatic: Mild retroperitoneal lymphadenopathy seen in the left paraaortic region measuring 1.6 cm on image 69 of series 201.  Reproductive:  Normal uterus.  Other:  None.  Musculoskeletal:  No suspicious bone lesions identified.  IMPRESSION: Annular constricting rectosigmoid colon carcinoma measuring approximately 7 cm in length and causing partial colonic obstruction.  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  Diffuse liver metastases.  Mild left paraaortic retroperitoneal lymphadenopathy, consistent with metastatic disease.  No evidence of extrathoracic metastatic disease.   Electronically Signed   By: Earle Gell M.D.   On: 06/29/2014 19:09   Dg Abd Portable 1v  06/30/2014   CLINICAL DATA:  Evaluation for possible bowel perforation following sigmoidoscopy and colonic stent placement.  EXAM: PORTABLE ABDOMEN - 1 VIEW  COMPARISON:  Intraoperative fluoroscopic images earlier today. CT chest, abdomen, and pelvis 06/29/2014.  FINDINGS: Two left lateral decubitus abdominal radiographs are provided. No intraperitoneal free air is identified. Residual oral contrast is present in multiple loops of grossly nondilated bowel. A stent is present in the rectosigmoid region.  IMPRESSION: No evidence of intraperitoneal free air.  Rectosigmoid stent.  These results were called by telephone (by Dr. Maryland Pink) at the time of interpretation on 06/30/2014 at 5:05 pm to Dr. Carol Ada , who verbally acknowledged these results.   Electronically Signed   By: Logan Bores   On: 06/30/2014 17:12   Dg C-arm 1-60 Min  06/30/2014   CLINICAL DATA:  Placement of a colonic stent  EXAM: DG C-ARM 61-120 MIN; ABDOMEN - 1 VIEW  FLUOROSCOPY TIME:  Fluoroscopy Time (in minutes and seconds): 3 minutes, 2 seconds  Number of Acquired Images:  2  COMPARISON:  CT scan of today's date  FINDINGS: Two fluoro spot films reveal the presence of a stent in the  rectosigmoid.  IMPRESSION: Placement of a rectosigmoid stent for colonic malignancy. No immediate postprocedure complication is demonstrated.   Electronically Signed   By: David  Martinique   On: 06/30/2014 16:56    Labs:  CBC:  Recent Labs  06/30/14 0435 07/01/14 0700 07/02/14 0605 07/03/14 0438  WBC 9.5 11.1* 11.7* 8.7  HGB 9.0* 8.5* 8.6* 8.6*  HCT 28.2* 26.6* 26.2* 26.9*  PLT 171 151 149* 144*    COAGS: No results for input(s): INR, APTT in the last 8760 hours.  BMP:  Recent Labs  05/31/14 1637 06/29/14 1451 06/30/14 0435 07/03/14 0438  NA 133* 139 137 141  K 3.9 3.5 3.6 3.6  CL 97 98 102 111  CO2 26 28 22 25   GLUCOSE 95 88 62* 116*  BUN 22 16 14  <5*  CALCIUM 8.8 8.9 8.2* 7.9*  CREATININE 0.69 0.65 0.80 0.50  GFRNONAA >90 >90 80* >90  GFRAA >90 >90 >90 >90    LIVER FUNCTION TESTS:  Recent Labs  06/29/14 1451 06/30/14 0435  BILITOT 0.7 0.8  AST 43* 37  ALT 40* 33  ALKPHOS 158* 141*  PROT 6.4 5.7*  ALBUMIN 3.2* 2.8*    TUMOR MARKERS:  Recent Labs  06/29/14 1451  CEA 54.7*    Assessment and Plan:  New colon cancer Liver metastasis Scheduled for liver lesion bx 4/6 in IR Risks and Benefits discussed with the patient including, but not limited to bleeding, infection, damage to adjacent structures or low yield requiring additional tests. All of the patient's questions were answered, patient is agreeable to proceed. Consent signed and in chart.   Thank you for this interesting consult.  I greatly enjoyed meeting KYNNEDI ZWEIG and look forward to participating in their care.  Signed: Brandonlee Navis A 07/04/2014, 10:25 AM   I spent a total of 40 Minutes  in face to face in clinical consultation, greater than 50% of which was counseling/coordinating care for liver lesion bx

## 2014-07-04 NOTE — Progress Notes (Signed)
[facility] Progress Note  4 Days Post-Op  Subjective: Pt feels good, still some pain in Right side of abdomen.  No N/V, tolerating food well.  Feels bloated.  Ambulating well.  Was up getting herself cleaned up today.  She changed her mind about getting the port-a-cath today, she now wants it before she is discharged.  No BM yet yesterday or today.  Having minimal flatus.    Objective: Vital signs in last 24 hours: Temp:  [98.6 F (37 C)-99.1 F (37.3 C)] 99.1 F (37.3 C) (04/05 0612) Pulse Rate:  [79-84] 79 (04/05 0612) Resp:  [17-18] 17 (04/05 0612) BP: (126-140)/(55-72) 127/55 mmHg (04/05 0612) SpO2:  [95 %-100 %] 95 % (04/05 0612) Last BM Date: 07/02/14  Intake/Output from previous day: 04/04 0701 - 04/05 0700 In: 2836 [P.O.:580; I.V.:1856; IV Piggyback:400] Out: -  Intake/Output this shift:    PE: Gen:  Alert, NAD, pleasant Abd: Soft, mildly distended, not very tender, +BS, no HSM   Lab Results:   Recent Labs  07/02/14 0605 07/03/14 0438  WBC 11.7* 8.7  HGB 8.6* 8.6*  HCT 26.2* 26.9*  PLT 149* 144*   BMET  Recent Labs  07/03/14 0438  NA 141  K 3.6  CL 111  CO2 25  GLUCOSE 116*  BUN <5*  CREATININE 0.50  CALCIUM 7.9*   PT/INR No results for input(s): LABPROT, INR in the last 72 hours. CMP     Component Value Date/Time   NA 141 07/03/2014 0438   K 3.6 07/03/2014 0438   CL 111 07/03/2014 0438   CO2 25 07/03/2014 0438   GLUCOSE 116* 07/03/2014 0438   BUN <5* 07/03/2014 0438   CREATININE 0.50 07/03/2014 0438   CALCIUM 7.9* 07/03/2014 0438   PROT 5.7* 06/30/2014 0435   ALBUMIN 2.8* 06/30/2014 0435   AST 37 06/30/2014 0435   ALT 33 06/30/2014 0435   ALKPHOS 141* 06/30/2014 0435   BILITOT 0.8 06/30/2014 0435   GFRNONAA >90 07/03/2014 0438   GFRAA >90 07/03/2014 0438   Lipase  No results found for: LIPASE     Studies/Results: Ct Abdomen Pelvis W Contrast  07/02/2014   CLINICAL DATA:  Known history of colon carcinoma,  status post stent placement with persistent abdominal pain  EXAM: CT ABDOMEN AND PELVIS WITH CONTRAST  TECHNIQUE: Multidetector CT imaging of the abdomen and pelvis was performed using the standard protocol following bolus administration of intravenous contrast.  CONTRAST:  156mL OMNIPAQUE IOHEXOL 300 MG/ML  SOLN  COMPARISON:  06/29/2014  FINDINGS: Lung bases are free of acute infiltrate or sizable effusion. The liver again demonstrates multiple peripherally enhancing metastatic lesions the largest of which lies within the left lobe of the liver. These are stable from the recent exam. The spleen, adrenal glands, pancreas, gallbladder and kidneys are stable in appearance from the recent study.  Left retroperitoneal adenopathy is again identified adjacent to the renal hilum stable in appearance. Multiple omental deposits are noted particularly in the low pelvis consistent with metastatic disease also stable from the prior exam. Mild ascites is noted likely related to the omental disease. The degree of ascites has increased slightly in the interval from the prior exam. No free air is seen.  The known colonic mass lesion is again seen in the rectosigmoid region. A stent is now noted traversing the colonic mass. There is a collection of fecal material within the proximal portion of the stent which appears slightly larger than the nominal lumen of the stent  although no definitive obstructive changes are seen.  The bladder is well distended.  No bony abnormality is seen.  IMPRESSION: Changes consistent with the known history of colon carcinoma with metastatic disease involving the liver and peritoneum.  Recent stent placement within the colonic lesion. Some fecal material is noted within the proximal portion of the stent which appears larger than the lumen of the stent. This is of uncertain significance. No proximal obstructive changes are noted. No evidence of free air is identified to suggest perforation.  Mild ascites  is noted.   Electronically Signed   By: Inez Catalina M.D.   On: 07/02/2014 12:48    Anti-infectives: Anti-infectives    Start     Dose/Rate Route Frequency Ordered Stop   07/01/14 0900  imipenem-cilastatin (PRIMAXIN) 250 mg in sodium chloride 0.9 % 100 mL IVPB     250 mg 200 mL/hr over 30 Minutes Intravenous 4 times per day 07/01/14 0825     07/01/14 0815  imipenem-cilastatin (PRIMAXIN) 500 mg in sodium chloride 0.9 % 100 mL IVPB  Status:  Discontinued     500 mg 200 mL/hr over 30 Minutes Intravenous 3 times per day 07/01/14 0801 07/01/14 0825   06/30/14 2200  cefTRIAXone (ROCEPHIN) 1 g in dextrose 5 % 50 mL IVPB - Premix  Status:  Discontinued     1 g 100 mL/hr over 30 Minutes Intravenous Every 12 hours 06/30/14 1712 06/30/14 1712   06/30/14 1715  metroNIDAZOLE (FLAGYL) IVPB 500 mg  Status:  Discontinued     500 mg 100 mL/hr over 60 Minutes Intravenous Every 8 hours 06/30/14 1712 06/30/14 1712       Assessment/Plan Fever and worsening symptoms following colonoscopic stenting of malignant colon stricture on 06/30/2014.  -Fevers resolved and WBC normal -There may have been a microperforation, but no perforation on CT -She has been on IV antibiotics for 72 hours per Dr. Benson Norway -Tolerated soft diet yesterday, now NPO for port-a-cath -Having loose BM's 2 days ago, Dr. 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 -Follow-up with Dr. Julieanne Manson regarding management of her systemic disease for disease control  -Will likely get liver biopsy tomorrow  Long history of Crohn's disease.    LOS: 5 days    Coralie Keens 07/04/2014, 7:52 AM Pager: [phone]  Agree with  above. In talking to the patient about timing and getting things done.  It appears in her best interest to get the power port placed during this admission.   She also is on for a liver biopsy.  Again I reviewed the power port surgery.  Power port placement I discussed the indications and potential complications of the power port placement.  The primary complications of the power port, include, but are not limited to, bleeding, infection, nerve injury, thrombosis, and pneumothorax.  Will place power port today.    Alphonsa Overall, MD, [facility] Pager: [phone] Office phone:  [phone]

## 2014-07-04 NOTE — Op Note (Signed)
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  ANESTHESIA:   general  Anesthesiologist: Albertha Ghee, MD; Finis Bud, MD CRNA: Gaylene Brooks, CRNA  General  EBL:  minimal  ml  COUNTS CORRECT:  YES  INDICATIONS FOR PROCEDURE:  Sydney Morrison is a 59 y.o. (DOB: 1956/01/15) white female whose primary care physician is [name], [name], MD and comes for power port placement for the treatment of metastatic rectosigmoid cancer.  Dr. Benay Spice is her treating oncologist.   The indications and risks of the surgery were explained to the patient.  The risks include, but are not limited to, infection, bleeding, pneumothorax, nerve injury, and thrombosis of the vein.  OPERATIVE NOTE:  The patient was taken to Room #1 at [facility].  Anesthesia was provided by Anesthesiologist: Albertha Ghee, MD; Finis Bud, MD CRNA: Gaylene Brooks, CRNA.  At the beginning of the operation she had a roll placed under her back, and had the upper chest/neck prepped with Chloroprep and draped.  She was already on Primaxin as an antibiotic   A time out was held and the surgery checklist reviewed.   The patient was placed in Trendelenburg position.  The left subclavian vein was accessed with a 16 gauge needle and a guide wire threaded through the needle into the vein.  The position of the wire was checked with fluoroscopy.   I then developed a pocket in the upper inner aspect of the left chest for the port reservoir.  I used the [employer] for venous access.  The reservoir was sewn in place with a 3-0 Vicryl suture.  The reservoir had been flushed with dilute  (10 units/cc) heparin.   I then passed the silastic tubing from the reservoir incision to the subclavian stick site and used the 8 French introducer to pass it into the vein.  The tip of the silastic catheter was positioned at the junction of the SVC and the right atrium under fluoroscopy.  The silastic catheter was then attached to the port with the bayonet device.     The entire port and tubing were checked with fluoroscopy and then the port was flushed with 4 cc of concentrated heparin (100 units/cc).   The wounds were then closed with 3-0 vicryl subcutaneous sutures and the skin closed with a 5-0 Monocryl suture.  The skin was painted with tincture of benzoin and steri-stripped.   The patient was transferred to the recovery room in good condition.  The sponge and needle count were correct at the end of the case.  A CXR is ordered for port placement and pending at the time of this note.   She will remain in the hospital for her planned liver biopsy.  Alphonsa Overall, MD, [facility] Pager: [phone] Office phone:  [phone]

## 2014-07-04 NOTE — Anesthesia Procedure Notes (Signed)
Procedure Name: LMA Insertion Date/Time: 07/04/2014 12:14 PM Performed by: Gaylene Brooks Pre-anesthesia Checklist: Patient identified, Timeout performed, Emergency Drugs available, Suction available and Patient being monitored Patient Re-evaluated:Patient Re-evaluated prior to inductionOxygen Delivery Method: Circle system utilized Preoxygenation: Pre-oxygenation with 100% oxygen Intubation Type: IV induction LMA: LMA inserted LMA Size: 4.0 Number of attempts: 1 Placement Confirmation: positive ETCO2 and breath sounds checked- equal and bilateral Tube secured with: Tape Dental Injury: Teeth and Oropharynx as per pre-operative assessment

## 2014-07-04 NOTE — Anesthesia Preprocedure Evaluation (Addendum)
Anesthesia Evaluation  Patient identified by MRN, date of birth, ID band Patient awake    Reviewed: Allergy & Precautions, NPO status , Patient's Chart, lab work & pertinent test results  Airway Mallampati: II  TM Distance: >3 FB Neck ROM: full    Dental  (+) Teeth Intact, Dental Advisory Given   Pulmonary Current Smoker,  breath sounds clear to auscultation        Cardiovascular negative cardio ROS  Rhythm:regular Rate:Normal     Neuro/Psych  Headaches,    GI/Hepatic   Endo/Other    Renal/GU      Musculoskeletal   Abdominal   Peds  Hematology   Anesthesia Other Findings   Reproductive/Obstetrics                            Anesthesia Physical Anesthesia Plan  ASA: II  Anesthesia Plan: General   Post-op Pain Management:    Induction: Intravenous  Airway Management Planned: LMA  Additional Equipment:   Intra-op Plan:   Post-operative Plan: Extubation in OR  Informed Consent: I have reviewed the patients History and Physical, chart, labs and discussed the procedure including the risks, benefits and alternatives for the proposed anesthesia with the patient or authorized representative who has indicated his/her understanding and acceptance.     Plan Discussed with: CRNA, Anesthesiologist and Surgeon  Anesthesia Plan Comments:         Anesthesia Quick Evaluation

## 2014-07-04 NOTE — Anesthesia Postprocedure Evaluation (Signed)
  Anesthesia Post-op Note  Patient: Sydney Morrison  Procedure(s) Performed: Procedure(s): POWER PORT PLACEMENT (N/A)  Patient Location: PACU  Anesthesia Type:General  Level of Consciousness: awake  Airway and Oxygen Therapy: Patient Spontanous Breathing  Post-op Pain: mild  Post-op Assessment: Post-op Vital signs reviewed  Post-op Vital Signs: Reviewed  Last Vitals:  Filed Vitals:   07/04/14 1415  BP:   Pulse: 70  Temp:   Resp: 24    Complications: No apparent anesthesia complications

## 2014-07-04 NOTE — Progress Notes (Signed)
Subjective: No significant bowel movement.  Objective: Vital signs in last 24 hours: Temp:  [97.9 F (36.6 C)-99.1 F (37.3 C)] 98.3 F (36.8 C) (04/05 1539) Pulse Rate:  [68-84] 77 (04/05 1539) Resp:  [15-28] 22 (04/05 1539) BP: (123-140)/(55-72) 127/61 mmHg (04/05 1539) SpO2:  [92 %-100 %] 93 % (04/05 1539) Last BM Date: 07/04/14  Intake/Output from previous day: 04/04 0701 - 04/05 0700 In: 2836 [P.O.:580; I.V.:1856; IV Piggyback:400] Out: -  Intake/Output this shift: Total I/O In: -  Out: 375 [Urine:375]  General appearance: alert and no distress GI: mildly distended and tympanic, +BS  Lab Results:  Recent Labs  07/02/14 0605 07/03/14 0438  WBC 11.7* 8.7  HGB 8.6* 8.6*  HCT 26.2* 26.9*  PLT 149* 144*   BMET  Recent Labs  07/03/14 0438  NA 141  K 3.6  CL 111  CO2 25  GLUCOSE 116*  BUN <5*  CREATININE 0.50  CALCIUM 7.9*   LFT No results for input(s): PROT, ALBUMIN, AST, ALT, ALKPHOS, BILITOT, BILIDIR, IBILI in the last 72 hours. PT/INR No results for input(s): LABPROT, INR in the last 72 hours. Hepatitis Panel No results for input(s): HEPBSAG, HCVAB, HEPAIGM, HEPBIGM in the last 72 hours. C-Diff No results for input(s): CDIFFTOX in the last 72 hours. Fecal Lactopherrin No results for input(s): FECLLACTOFRN in the last 72 hours.  Studies/Results: Dg Chest Port 1 View  07/04/2014   CLINICAL DATA:  Port-A-Cath placement.  Metastatic colon cancer.  EXAM: PORTABLE CHEST - 1 VIEW  COMPARISON:  06/29/2014  FINDINGS: Left-sided power injectable Port-A-Cath noted, tip projecting over the SVC. No pneumothorax. Indistinct linear opacities at both lung bases, potentially subsegmental atelectasis. The heart size within normal limits for technique.  IMPRESSION: 1. Left Port-A-Cath tip:  SVC.  No pneumothorax. 2. Bibasilar subsegmental atelectasis or scar.   Electronically Signed   By: Van Clines M.D.   On: 07/04/2014 14:33   Dg Fluoro Guide Cv Line-no  Report  07/04/2014   CLINICAL DATA:    FLOURO GUIDE CV LINE  Fluoroscopy was utilized by the requesting physician.  No radiographic  interpretation.     Medications:  Scheduled: . clonazePAM  1 mg Oral QHS  . docusate sodium  100 mg Oral BID  . imipenem-cilastatin  250 mg Intravenous 4 times per day  . nicotine  14 mg Transdermal Daily  . polyethylene glycol  17 g Oral Daily  . [START ON 07/05/2014] sodium phosphate  1 enema Rectal Once  . vitamin B-12  1,000 mcg Oral Daily   Continuous: . dextrose 5 % and 0.9 % NaCl with KCl 20 mEq/L 125 mL/hr at 07/04/14 1606    Assessment/Plan: 1) Metastatic rectosigmoid colon cancer. 2) S/p colonic stent placement. 3) History of Crohn's disease.   She is s/p Port-A-Cath placement by Dr. Lucia Gaskins.  Appreciate assistance.  She is supposed to have a liver biopsy tomorrow.  No significant bowel movement yet.  Her overflow incontinence has resolved.  Her abdomen is mildly distended and tympanic.  I will try her on a Fleets enema.  If she does not have a bowel movement, I will order a single contrast barium enema to ensure that her stent is patent.   LOS: 5 days   Sydney Morrison D 07/04/2014, 5:43 PM

## 2014-07-04 NOTE — Progress Notes (Signed)
IP PROGRESS NOTE  Subjective:   She reports having small loose bowel movements. No fever  Objective: Vital signs in last 24 hours: Blood pressure 127/55, pulse 79, temperature 99.1 F (37.3 C), temperature source Oral, resp. rate 17, height 5\' 7"  (1.702 m), weight 120 lb (54.432 kg), SpO2 95 %.  Intake/Output from previous day: 04/04 0701 - 04/05 0700 In: 2836 [P.O.:580; I.V.:1856; IV Piggyback:400] Out: -   Physical examination: Not performed today  Lab Results:  Recent Labs  07/02/14 0605 07/03/14 0438  WBC 11.7* 8.7  HGB 8.6* 8.6*  HCT 26.2* 26.9*  PLT 149* 144*    BMET  Recent Labs  07/03/14 0438  NA 141  K 3.6  CL 111  CO2 25  GLUCOSE 116*  BUN <5*  CREATININE 0.50  CALCIUM 7.9*    Studies/Results: Ct Abdomen Pelvis W Contrast  07/02/2014   CLINICAL DATA:  Known history of colon carcinoma, status post stent placement with persistent abdominal pain  EXAM: CT ABDOMEN AND PELVIS WITH CONTRAST  TECHNIQUE: Multidetector CT imaging of the abdomen and pelvis was performed using the standard protocol following bolus administration of intravenous contrast.  CONTRAST:  148mL OMNIPAQUE IOHEXOL 300 MG/ML  SOLN  COMPARISON:  06/29/2014  FINDINGS: Lung bases are free of acute infiltrate or sizable effusion. The liver again demonstrates multiple peripherally enhancing metastatic lesions the largest of which lies within the left lobe of the liver. These are stable from the recent exam. The spleen, adrenal glands, pancreas, gallbladder and kidneys are stable in appearance from the recent study.  Left retroperitoneal adenopathy is again identified adjacent to the renal hilum stable in appearance. Multiple omental deposits are noted particularly in the low pelvis consistent with metastatic disease also stable from the prior exam. Mild ascites is noted likely related to the omental disease. The degree of ascites has increased slightly in the interval from the prior exam. No free air is  seen.  The known colonic mass lesion is again seen in the rectosigmoid region. A stent is now noted traversing the colonic mass. There is a collection of fecal material within the proximal portion of the stent which appears slightly larger than the nominal lumen of the stent although no definitive obstructive changes are seen.  The bladder is well distended.  No bony abnormality is seen.  IMPRESSION: Changes consistent with the known history of colon carcinoma with metastatic disease involving the liver and peritoneum.  Recent stent placement within the colonic lesion. Some fecal material is noted within the proximal portion of the stent which appears larger than the lumen of the stent. This is of uncertain significance. No proximal obstructive changes are noted. No evidence of free air is identified to suggest perforation.  Mild ascites is noted.   Electronically Signed   By: Inez Catalina M.D.   On: 07/02/2014 12:48    Medications: I have reviewed the patient's current medications.  Assessment/Plan:  1. Rectal cancer  Obstructing mass at 10 cm from the anal verge noted on a colonoscopy 06/29/2014  CT chest, abdomen and pelvis 06/29/2014 revealed a partially obstructing rectosigmoid mass, widespread peritoneal metastatic disease in the pelvis, and diffuse liver metastases  2. Status post placement of a rectal stent on 06/30/2014, placed on bowel rest and IV antibiotics secondary to a possible microperforation  3. Crohn's disease  4. Anemia  5. Anorexia/weight loss  6. Fever-tumor fever?, Microperforation related to the rectal stent?-Improved    Recommendations: 1. Management of rectal obstruction per surgery  and gastroenterology 2. Place Port-A-Cath per surgical service 3. Proceed with biopsy of a liver lesion or peritoneal mass in interventional radiology to confirm a diagnosis of metastatic colon cancer and obtain tissue for molecular testing 4. Outpatient follow-up will be scheduled  at the Cancer center for the week of 07/10/2014  LOS: 5 days   Sydney Morrison  07/04/2014, 8:38 AM

## 2014-07-04 NOTE — Transfer of Care (Signed)
Immediate Anesthesia Transfer of Care Note  Patient: Sydney Morrison  Procedure(s) Performed: Procedure(s): POWER POINT PLACEMENT (N/A)  Patient Location: PACU  Anesthesia Type:General  Level of Consciousness: awake, alert  and oriented  Airway & Oxygen Therapy: Patient Spontanous Breathing and Patient connected to nasal cannula oxygen  Post-op Assessment: Report given to RN, Post -op Vital signs reviewed and stable and Patient moving all extremities X 4  Post vital signs: Reviewed and stable  Last Vitals:  Filed Vitals:   07/04/14 1029  BP: 138/69  Pulse: 84  Temp: 36.7 C  Resp: 18    Complications: No apparent anesthesia complications

## 2014-07-05 ENCOUNTER — Inpatient Hospital Stay (HOSPITAL_COMMUNITY): Payer: 59

## 2014-07-05 ENCOUNTER — Encounter (HOSPITAL_COMMUNITY): Payer: Self-pay | Admitting: Surgery

## 2014-07-05 LAB — CBC
HEMATOCRIT: 28 % — AB (ref 36.0–46.0)
Hemoglobin: 8.9 g/dL — ABNORMAL LOW (ref 12.0–15.0)
MCH: 27.6 pg (ref 26.0–34.0)
MCHC: 31.8 g/dL (ref 30.0–36.0)
MCV: 87 fL (ref 78.0–100.0)
PLATELETS: 155 10*3/uL (ref 150–400)
RBC: 3.22 MIL/uL — ABNORMAL LOW (ref 3.87–5.11)
RDW: 13.5 % (ref 11.5–15.5)
WBC: 9.2 10*3/uL (ref 4.0–10.5)

## 2014-07-05 LAB — BASIC METABOLIC PANEL
Anion gap: 6 (ref 5–15)
CO2: 26 mmol/L (ref 19–32)
CREATININE: 0.57 mg/dL (ref 0.50–1.10)
Calcium: 8 mg/dL — ABNORMAL LOW (ref 8.4–10.5)
Chloride: 107 mmol/L (ref 96–112)
GFR calc non Af Amer: >90 mL/min (ref >90)
Glucose, Bld: 117 mg/dL — ABNORMAL HIGH (ref 70–99)
Potassium: 3.5 mmol/L (ref 3.5–5.1)
Sodium: 139 mmol/L (ref 135–145)

## 2014-07-05 LAB — PROTIME-INR
INR: 1.2 (ref 0.00–1.49)
Prothrombin Time: 15.4 seconds — ABNORMAL HIGH (ref 11.6–15.2)

## 2014-07-05 LAB — APTT: aPTT: 35 seconds (ref 24–37)

## 2014-07-05 MED ORDER — MORPHINE SULFATE 4 MG/ML IJ SOLN
INTRAMUSCULAR | Status: AC
  Administered 2014-07-05: 4 mg
  Filled 2014-07-05: qty 1

## 2014-07-05 MED ORDER — MIDAZOLAM HCL 2 MG/2ML IJ SOLN
INTRAMUSCULAR | Status: AC | PRN
  Administered 2014-07-05: 0.5 mg via INTRAVENOUS

## 2014-07-05 MED ORDER — ENSURE ENLIVE PO LIQD
237.0000 mL | Freq: Two times a day (BID) | ORAL | Status: DC
  Administered 2014-07-05 – 2014-07-10 (×8): 237 mL via ORAL

## 2014-07-05 MED ORDER — MORPHINE SULFATE 2 MG/ML IJ SOLN
2.0000 mg | Freq: Once | INTRAMUSCULAR | Status: AC
  Administered 2014-07-05: 4 mg via INTRAVENOUS

## 2014-07-05 MED ORDER — MIDAZOLAM HCL 2 MG/2ML IJ SOLN
INTRAMUSCULAR | Status: AC
  Filled 2014-07-05: qty 2

## 2014-07-05 MED ORDER — FENTANYL CITRATE 0.05 MG/ML IJ SOLN
INTRAMUSCULAR | Status: AC | PRN
  Administered 2014-07-05: 12.5 ug via INTRAVENOUS

## 2014-07-05 MED ORDER — LIDOCAINE HCL (PF) 1 % IJ SOLN
INTRAMUSCULAR | Status: AC
  Filled 2014-07-05: qty 10

## 2014-07-05 MED ORDER — FENTANYL CITRATE 0.05 MG/ML IJ SOLN
INTRAMUSCULAR | Status: AC
  Filled 2014-07-05: qty 2

## 2014-07-05 MED ORDER — LIDOCAINE HCL 2 % IJ SOLN
20.0000 mL | Freq: Once | INTRAMUSCULAR | Status: AC
  Administered 2014-07-05: 400 mg via INTRADERMAL
  Filled 2014-07-05: qty 20

## 2014-07-05 NOTE — Op Note (Signed)
06/29/2014 - 07/04/2014  9:02 AM  PATIENT:  Sydney Morrison, 59 y.o., female, MRN: [id number]  PREOP DIAGNOSIS:  Left pneumothorax  POSTOP DIAGNOSIS:   Left pneumothorax  PROCEDURE:   Procedure(s):  #20 left CT placemnt  SURGEON:   Alphonsa Overall, M.D.   ANESTHESIA:   Local       15 cc of 2% xylocaine  EBL:  minimal  ml   INDICATIONS FOR PROCEDURE:  Sydney Morrison is a 59 y.o. (DOB: 07/19/1955) white  female whose primary care physician is HALL, [facility], MD.  She had a left subclavian power port placed yesterday.  The initial CXR was okay.  But last PM, she became short of breath and a CXR showed a left pneumothorax.   The indications and risks of the surgery were explained to the patient.  The risks include, but are not limited to, infection, bleeding, and nerve injury.  PROCEDURE: She is in room 6N23.  She was placed in a supine position.  Her left chest was prepped with chloroprep.  A time out was held.  I infiltrated the left midaxillary area with 15 cc of 2% xylocaine.  I tried to go to the 5th intercostal space in the anterior axillary line.  I made an incision in the skin. I opened the pleural cavity with hemostat.  I placed a #20 CT and sewed it in placed with a 0 silk sutures x 2.  The CT was then sterilely dressed.  The tube was hooked up to a pleuravac.  There was movement of the underwater seal.  Her post procedure CXR showed the lung re-expanded.  Alphonsa Overall, MD, [facility] Pager: [phone] Office phone:  [phone]

## 2014-07-05 NOTE — Sedation Documentation (Signed)
MD at bedside speaking with patient who awakens easily.

## 2014-07-05 NOTE — Progress Notes (Signed)
[facility] Progress Note  1 Day Post-Op  Subjective: Pt c/o SOB, coughing after left port placement yesterday.  Pt denies much pain at port site.  No N/V, tolerating diet.  Pending liver biopsy.  Ambulating well.  She has been informed she has a pneumothorax.  Nervous about the chest tube placement.  Objective: Vital signs in last 24 hours: Temp:  [97.9 F (36.6 C)-100.6 F (38.1 C)] 100.6 F (38.1 C) (04/06 0618) Pulse Rate:  [68-96] 96 (04/06 0618) Resp:  [15-28] 20 (04/06 0618) BP: (123-145)/(60-69) 145/64 mmHg (04/06 0618) SpO2:  [90 %-100 %] 90 % (04/06 0618) Last BM Date: 07/03/14  Intake/Output from previous day: 04/05 0701 - 04/06 0700 In: -  Out: 375 [Urine:375] Intake/Output this shift:    PE: Gen:  Alert, NAD, pleasant Card:  RRR, no M/G/R heard Pulm:  Diminished breath sounds on left, pneumo seen on CXR this am, right lung fields CTA Abd: Soft, NT/ND, +BS, no HSM Ext:  No erythema, edema, or tenderness  Lab Results:   Recent Labs  07/03/14 0438 07/05/14 0407  WBC 8.7 9.2  HGB 8.6* 8.9*  HCT 26.9* 28.0*  PLT 144* 155   BMET  Recent Labs  07/03/14 0438 07/05/14 0407  NA 141 139  K 3.6 3.5  CL 111 107  CO2 25 26  GLUCOSE 116* 117*  BUN <5* <5*  CREATININE 0.50 0.57  CALCIUM 7.9* 8.0*   PT/INR  Recent Labs  07/05/14 0407  LABPROT 15.4*  INR 1.20   CMP     Component Value Date/Time   NA 139 07/05/2014 0407   K 3.5 07/05/2014 0407   CL 107 07/05/2014 0407   CO2 26 07/05/2014 0407   GLUCOSE 117* 07/05/2014 0407   BUN <5* 07/05/2014 0407   CREATININE 0.57 07/05/2014 0407   CALCIUM 8.0* 07/05/2014 0407   PROT 5.7* 06/30/2014 0435   ALBUMIN 2.8* 06/30/2014 0435   AST 37 06/30/2014 0435   ALT 33 06/30/2014 0435   ALKPHOS 141* 06/30/2014 0435   BILITOT 0.8 06/30/2014 0435   GFRNONAA >90 07/05/2014 0407   GFRAA >90 07/05/2014 0407   Lipase  No results found for: LIPASE     Studies/Results: Dg Chest Port 1  View  07/05/2014   ADDENDUM REPORT: 07/05/2014 07:17  ADDENDUM: Critical Value/emergent results were called by telephone at the time of interpretation on 07/05/2014 at 7:17 am to Dr. Hilbert Odor, who verbally acknowledged these results.   Electronically Signed   By: Lowella Grip III M.D.   On: 07/05/2014 07:17   07/05/2014   CLINICAL DATA:  Fever and cough  EXAM: PORTABLE CHEST - 1 VIEW  COMPARISON:  July 04, 2014  FINDINGS: There is a large pneumothorax on the left without appreciable tension component. There is consolidation in the collapsed left upper lobe. The right lung is clear. The heart is upper normal in size with pulmonary vascular within normal limits. Port-A-Cath tip is in the superior vena cava. Note that there is subcutaneous air in the supraclavicular region on the left.  IMPRESSION: Large left pneumothorax without tension component. Consolidation in the collapsed left upper lobe. Extensive subcutaneous air on the left. Right lung clear. No change in cardiac silhouette.  Electronically Signed: By: Lowella Grip III M.D. On: 07/05/2014 07:13   Dg Chest Port 1 View  07/04/2014   CLINICAL DATA:  Port-A-Cath placement.  Metastatic colon cancer.  EXAM: PORTABLE CHEST - 1 VIEW  COMPARISON:  06/29/2014  FINDINGS:  Left-sided power injectable Port-A-Cath noted, tip projecting over the SVC. No pneumothorax. Indistinct linear opacities at both lung bases, potentially subsegmental atelectasis. The heart size within normal limits for technique.  IMPRESSION: 1. Left Port-A-Cath tip:  SVC.  No pneumothorax. 2. Bibasilar subsegmental atelectasis or scar.   Electronically Signed   By: Van Clines M.D.   On: 07/04/2014 14:33   Dg Fluoro Guide Cv Line-no Report  07/04/2014   CLINICAL DATA:    FLOURO GUIDE CV LINE  Fluoroscopy was utilized by the requesting physician.  No radiographic  interpretation.     Anti-infectives: Anti-infectives    Start     Dose/Rate Route Frequency Ordered Stop    07/01/14 0900  imipenem-cilastatin (PRIMAXIN) 250 mg in sodium chloride 0.9 % 100 mL IVPB  Status:  Discontinued     250 mg 200 mL/hr over 30 Minutes Intravenous 4 times per day 07/01/14 0825 07/05/14 0652   07/01/14 0815  imipenem-cilastatin (PRIMAXIN) 500 mg in sodium chloride 0.9 % 100 mL IVPB  Status:  Discontinued     500 mg 200 mL/hr over 30 Minutes Intravenous 3 times per day 07/01/14 0801 07/01/14 0825   06/30/14 2200  cefTRIAXone (ROCEPHIN) 1 g in dextrose 5 % 50 mL IVPB - Premix  Status:  Discontinued     1 g 100 mL/hr over 30 Minutes Intravenous Every 12 hours 06/30/14 1712 06/30/14 1712   06/30/14 1715  metroNIDAZOLE (FLAGYL) IVPB 500 mg  Status:  Discontinued     500 mg 100 mL/hr over 60 Minutes Intravenous Every 8 hours 06/30/14 1712 06/30/14 1712       Assessment/Plan Fever and worsening symptoms following colonoscopic stenting of malignant colon stricture on 06/30/2014.  -Fevers resolved and WBC normal -There may have been a microperforation, but no perforation on CT -D/c antibiotics  -Tolerated soft diet yesterday, now NPO for port-a-cath -Having loose BM's 2 days ago, Dr. Benson Norway added stool softeners/laxatives -Discussed low fiber diet and eating smaller soft/pureed meals throughout the day -Patient has reconsidered the port this admission, Port-a-cath today  Left pneumothorax s/p port-a-cath placement Dr Lucia Gaskins on 07/04/14  -Placed left thoracostomy tube 20 french today - Dr. Lucia Gaskins, CT on 20cm suction  Post procedure CXR shows lung expanded.  Will leave on suction tonight.  If lung looks good tomorrow on CXR, will take CT off suction x 24 hours - then try to remove tube.  Will probably need to stay in the hospital 24 hours after CT is removed.   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 -Follow-up with Dr. Julieanne Manson  regarding management of her systemic disease for disease control -Pending liver biopsy today  Long history of Crohn's disease.     LOS: 6 days    Sydney Morrison 07/05/2014, 8:55 AM Pager: [phone]  Agree with above. Husband in room. She does not feel good with all that has gone on.  Alphonsa Overall, MD, [facility] Pager: [phone] Office phone:  [phone]

## 2014-07-05 NOTE — Progress Notes (Signed)
NUTRITION FOLLOW UP  Intervention:   Ensure Enlive po BID, each supplement provides 350 kcal and 20 grams of protein  Nutrition Dx:   Inadequate oral intake related to altered GI function as evidenced by PO: 25%; ongoing  Goal:   Pt will meet >90% of estimated nutritional needs; unmet  Monitor:   PO/supplement intake, labs, weight changes, I/O's  Assessment:   Sydney Morrison is an 59 y.o. Female with a PMH significant for Crohn's disease and recently for a diagnosis of C diff, had sought treatment for intermittent bloody diarrhea. She was diagnosed with C diff and treated appropriately but it didn't resolve her symptoms. Therefore she underwent colonoscopy where an obstructing mass was identified 10cm from the anal verge. Biopsies were taken. She reports concurrent constitutional symptoms of fevers, fatigue, and unexplained weight loss over the past 1-2 months.  Chart review reveals that work-up revealed stage IV Stage IV rectosigmoid colon cancer with liver mets. S/p Port-a-cath placement on 07/04/14.Marland Kitchen  Observed pt was very tearful today. Pt has a liver biopsy earlier this AM. Chest tube also placed due to left pneumothorax- no output currently recorded.  Pt's last BM was on 07/03/14- on bowel regimen.  Pt is currently on a regular diet. No intake recorded over the past 3 days, however, noted last intake was 25%. Will add Ensure supplement to maximize nutritional intake.  Labs reviewed. BUN <5, Calcium: 8.0, Glucose: 117.  Height: Ht Readings from Last 1 Encounters:  06/29/14 5\' 7"  (1.702 m)    Weight Status:   Wt Readings from Last 1 Encounters:  06/29/14 120 lb (54.432 kg)    Re-estimated needs:  Kcal: 1600-1800 Protein: 70-80 grams Fluid: 1.6-1.8 L  Skin: closed upper abdominal incision, lt chest tube placement  Diet Order: Diet regular Room service appropriate?: Yes; Fluid consistency:: Thin  No intake or output data in the 24 hours ending 07/05/14 1503  Last BM:  07/03/14   Labs:   Recent Labs Lab 06/30/14 0435 07/03/14 0438 07/05/14 0407  NA 137 141 139  K 3.6 3.6 3.5  CL 102 111 107  CO2 22 25 26   BUN 14 <5* <5*  CREATININE 0.80 0.50 0.57  CALCIUM 8.2* 7.9* 8.0*  GLUCOSE 62* 116* 117*    CBG (last 3)  No results for input(s): GLUCAP in the last 72 hours.  Scheduled Meds: . clonazePAM  1 mg Oral QHS  . docusate sodium  100 mg Oral BID  . fentaNYL      . lidocaine (PF)      . midazolam      . nicotine  14 mg Transdermal Daily  . polyethylene glycol  17 g Oral Daily  . sodium phosphate  1 enema Rectal Once  . vitamin B-12  1,000 mcg Oral Daily    Continuous Infusions: . dextrose 5 % and 0.9 % NaCl with KCl 20 mEq/L 125 mL/hr at 07/05/14 0853    Stevan Eberwein A. Jimmye Norman, RD, LDN, CDE Pager: [phone] After hours Pager: [phone]

## 2014-07-05 NOTE — Progress Notes (Signed)
Pt's temp at 0618 was 100.6, 02 sat high 70's-low 80's on RA, pt was put on [place] at 4 L and O2 sat went up to 90. This Probation officer called Dr. Benson Norway and got an order for stat  Chest X-Ray.

## 2014-07-05 NOTE — Progress Notes (Signed)
Subjective: Coughing since the port placement.  Nursing reported a low grade fever and O2 saturations in the upper 80's to low 90's on RA.  Objective: Vital signs in last 24 hours: Temp:  [97.9 F (36.6 C)-100.6 F (38.1 C)] 100.6 F (38.1 C) (04/06 0618) Pulse Rate:  [68-96] 96 (04/06 0618) Resp:  [15-28] 20 (04/06 0618) BP: (123-145)/(60-69) 145/64 mmHg (04/06 0618) SpO2:  [90 %-100 %] 90 % (04/06 0618) Last BM Date: 07/03/14  Intake/Output from previous day: 04/05 0701 - 04/06 0700 In: -  Out: 375 [Urine:375] Intake/Output this shift:    General appearance: alert, fatigued and no distress Resp: decreased breath sounds on the left lung field, CTA in the right lung field Cardio: regular rate and rhythm, S1, S2 normal, no murmur, click, rub or gallop GI: soft, non-tender; bowel sounds normal; no masses,  no organomegaly and tympanic  Lab Results:  Recent Labs  07/03/14 0438 07/05/14 0407  WBC 8.7 9.2  HGB 8.6* 8.9*  HCT 26.9* 28.0*  PLT 144* 155   BMET  Recent Labs  07/03/14 0438 07/05/14 0407  NA 141 139  K 3.6 3.5  CL 111 107  CO2 25 26  GLUCOSE 116* 117*  BUN <5* <5*  CREATININE 0.50 0.57  CALCIUM 7.9* 8.0*   LFT No results for input(s): PROT, ALBUMIN, AST, ALT, ALKPHOS, BILITOT, BILIDIR, IBILI in the last 72 hours. PT/INR  Recent Labs  07/05/14 0407  LABPROT 15.4*  INR 1.20   Hepatitis Panel No results for input(s): HEPBSAG, HCVAB, HEPAIGM, HEPBIGM in the last 72 hours. C-Diff No results for input(s): CDIFFTOX in the last 72 hours. Fecal Lactopherrin No results for input(s): FECLLACTOFRN in the last 72 hours.  Studies/Results: Dg Chest Port 1 View  07/05/2014   ADDENDUM REPORT: 07/05/2014 07:17  ADDENDUM: Critical Value/emergent results were called by telephone at the time of interpretation on 07/05/2014 at 7:17 am to Dr. Hilbert Odor, who verbally acknowledged these results.   Electronically Signed   By: Lowella Grip III M.D.   On:  07/05/2014 07:17   07/05/2014   CLINICAL DATA:  Fever and cough  EXAM: PORTABLE CHEST - 1 VIEW  COMPARISON:  July 04, 2014  FINDINGS: There is a large pneumothorax on the left without appreciable tension component. There is consolidation in the collapsed left upper lobe. The right lung is clear. The heart is upper normal in size with pulmonary vascular within normal limits. Port-A-Cath tip is in the superior vena cava. Note that there is subcutaneous air in the supraclavicular region on the left.  IMPRESSION: Large left pneumothorax without tension component. Consolidation in the collapsed left upper lobe. Extensive subcutaneous air on the left. Right lung clear. No change in cardiac silhouette.  Electronically Signed: By: Lowella Grip III M.D. On: 07/05/2014 07:13   Dg Chest Port 1 View  07/04/2014   CLINICAL DATA:  Port-A-Cath placement.  Metastatic colon cancer.  EXAM: PORTABLE CHEST - 1 VIEW  COMPARISON:  06/29/2014  FINDINGS: Left-sided power injectable Port-A-Cath noted, tip projecting over the SVC. No pneumothorax. Indistinct linear opacities at both lung bases, potentially subsegmental atelectasis. The heart size within normal limits for technique.  IMPRESSION: 1. Left Port-A-Cath tip:  SVC.  No pneumothorax. 2. Bibasilar subsegmental atelectasis or scar.   Electronically Signed   By: Van Clines M.D.   On: 07/04/2014 14:33   Dg Fluoro Guide Cv Line-no Report  07/04/2014   CLINICAL DATA:    FLOURO GUIDE CV LINE  Fluoroscopy was utilized by the requesting physician.  No radiographic  interpretation.     Medications:  Scheduled: . clonazePAM  1 mg Oral QHS  . docusate sodium  100 mg Oral BID  . nicotine  14 mg Transdermal Daily  . polyethylene glycol  17 g Oral Daily  . sodium phosphate  1 enema Rectal Once  . vitamin B-12  1,000 mcg Oral Daily   Continuous: . dextrose 5 % and 0.9 % NaCl with KCl 20 mEq/L 125 mL/hr at 07/04/14 2353    Assessment/Plan: 1) Left pneumothorax. 2)  Low grade fever. 3) Metastatic rectosigmoid colon cancer.   Nursing just informed me that Dr. Lucia Gaskins is on the way to resolve the left pneumothroax with a chest tube.  This explains her complaints of indigestion, the low grade fever, cough, and lower oxygenation.  As for her enema it will be placed on hold.  I am not certain if the liver biopsy can be performed today.  Plan: 1) Chest tube placement per Dr. Lucia Gaskins. 2) Continue with current supportive care.   LOS: 6 days   Sydney Morrison D 07/05/2014, 7:30 AM

## 2014-07-05 NOTE — Sedation Documentation (Signed)
Dr. Kathlene Cote obtaining samples. Pt with eyes closed in no apparent distress.

## 2014-07-05 NOTE — Procedures (Signed)
Procedure:  Ultrasound guided liver biopsy Findings:  11 G core biopsy x 4 via 17 G needle into dominant left lobe liver mass. No complications. EBL<25 mL

## 2014-07-05 NOTE — Sedation Documentation (Signed)
Patient is resting comfortably. 

## 2014-07-05 NOTE — Sedation Documentation (Signed)
Continues to obtain samples w/ pt resting quietly.

## 2014-07-06 ENCOUNTER — Inpatient Hospital Stay (HOSPITAL_COMMUNITY): Payer: 59

## 2014-07-06 LAB — BASIC METABOLIC PANEL
Anion gap: 6 (ref 5–15)
CO2: 25 mmol/L (ref 19–32)
CREATININE: 0.52 mg/dL (ref 0.50–1.10)
Calcium: 7.8 mg/dL — ABNORMAL LOW (ref 8.4–10.5)
Chloride: 107 mmol/L (ref 96–112)
GFR calc non Af Amer: >90 mL/min (ref >90)
Glucose, Bld: 121 mg/dL — ABNORMAL HIGH (ref 70–99)
POTASSIUM: 3.7 mmol/L (ref 3.5–5.1)
Sodium: 138 mmol/L (ref 135–145)

## 2014-07-06 LAB — CBC
HCT: 27.7 % — ABNORMAL LOW (ref 36.0–46.0)
Hemoglobin: 8.9 g/dL — ABNORMAL LOW (ref 12.0–15.0)
MCH: 28 pg (ref 26.0–34.0)
MCHC: 32.1 g/dL (ref 30.0–36.0)
MCV: 87.1 fL (ref 78.0–100.0)
Platelets: 122 10*3/uL — ABNORMAL LOW (ref 150–400)
RBC: 3.18 MIL/uL — ABNORMAL LOW (ref 3.87–5.11)
RDW: 13.6 % (ref 11.5–15.5)
WBC: 12.3 10*3/uL — ABNORMAL HIGH (ref 4.0–10.5)

## 2014-07-06 MED ORDER — FUROSEMIDE 40 MG PO TABS
40.0000 mg | ORAL_TABLET | Freq: Every day | ORAL | Status: AC
  Administered 2014-07-06 – 2014-07-07 (×2): 40 mg via ORAL
  Filled 2014-07-06 (×2): qty 1

## 2014-07-06 NOTE — Progress Notes (Signed)
Patient ID: Sydney Morrison, female   DOB: 1956-01-05, 59 y.o.   MRN: [id number] 2 Days Post-Op  Subjective: Pt doesn't feel well today.  Very worn out.  No BM in a couple of days.  Still feels somewhat SOB  Objective: Vital signs in last 24 hours: Temp:  [97.4 F (36.3 C)-99.1 F (37.3 C)] 98.7 F (37.1 C) (04/07 0945) Pulse Rate:  [17-90] 17 (04/07 0945) Resp:  [16-24] 17 (04/07 0945) BP: (126-141)/(55-65) 126/55 mmHg (04/07 0945) SpO2:  [92 %-99 %] 99 % (04/07 0945) Last BM Date: 07/03/14  Intake/Output from previous day: 04/06 0701 - 04/07 0700 In: 6305.8 [P.O.:360; I.V.:5945.8] Out: 230 [Chest Tube:230] Intake/Output this shift:    PE: Abd: soft, distended, +BS Chest: CTAB, no air leak noted  Lab Results:   Recent Labs  07/05/14 0407 07/06/14 0540  WBC 9.2 12.3*  HGB 8.9* 8.9*  HCT 28.0* 27.7*  PLT 155 122*   BMET  Recent Labs  07/05/14 0407 07/06/14 0540  NA 139 138  K 3.5 3.7  CL 107 107  CO2 26 25  GLUCOSE 117* 121*  BUN <5* <5*  CREATININE 0.57 0.52  CALCIUM 8.0* 7.8*   PT/INR  Recent Labs  07/05/14 0407  LABPROT 15.4*  INR 1.20   CMP     Component Value Date/Time   NA 138 07/06/2014 0540   K 3.7 07/06/2014 0540   CL 107 07/06/2014 0540   CO2 25 07/06/2014 0540   GLUCOSE 121* 07/06/2014 0540   BUN <5* 07/06/2014 0540   CREATININE 0.52 07/06/2014 0540   CALCIUM 7.8* 07/06/2014 0540   PROT 5.7* 06/30/2014 0435   ALBUMIN 2.8* 06/30/2014 0435   AST 37 06/30/2014 0435   ALT 33 06/30/2014 0435   ALKPHOS 141* 06/30/2014 0435   BILITOT 0.8 06/30/2014 0435   GFRNONAA >90 07/06/2014 0540   GFRAA >90 07/06/2014 0540   Lipase  No results found for: LIPASE     Studies/Results: US Biopsy  07/05/2014   CLINICAL DATA:  Rectosigmoid carcinoma with multiple liver lesions likely representing metastatic disease. Request has been made to biopsy one of the liver lesions.  EXAM: ULTRASOUND GUIDED CORE BIOPSY OF LIVER  MEDICATIONS: 0.5 mg IV  Versed; 12.5 mcg IV Fentanyl  Total Moderate Sedation Time: 11 minutes  PROCEDURE: The procedure, risks, benefits, and alternatives were explained to the patient. Questions regarding the procedure were encouraged and answered. The patient understands and consents to the procedure. A time-out was performed prior to the procedure.  The anterior abdominal wall was prepped with Betadine in a sterile fashion, and a sterile drape was applied covering the operative field. A sterile gown and sterile gloves were used for the procedure. Local anesthesia was provided with 1% Lidocaine.  A lesion was localized in the left lobe of the liver. Under ultrasound guidance, a 17 gauge needle was advanced into the left lobe. Coaxial 18 gauge core biopsy samples were obtained with a total of 4 core biopsy samples obtained and submitted in formalin. The outer needle was removed and additional ultrasound performed.  COMPLICATIONS: None.  FINDINGS: Multiple large lesions are seen throughout the liver. The dominant left lobe lesion was localized and sampled. Solid tissue was obtained. There were no immediate complications.  IMPRESSION: Ultrasound-guided core biopsy performed of a large lesion in the left lobe of the liver.   Electronically Signed   By: Aletta Edouard M.D.   On: 07/05/2014 12:49   Dg Chest [facility]  07/06/2014   CLINICAL DATA:  Recent pneumothorax recent pneumothorax with chest tube in place  EXAM: PORTABLE CHEST - 1 VIEW  COMPARISON:  July 05, 2014  FINDINGS: There is no change in left chest tube position. There is a tiny left apical pneumothorax currently. There is subcutaneous air on the left. There is moderate effusion on the left with patchy infiltrate in the left mid lung. There is mild atelectasis in right base with minimal right effusion. Lungs are otherwise clear. Heart is upper normal in size with pulmonary vascular within normal limits. Port-A-Cath tip in superior vena cava. No adenopathy.  IMPRESSION: No  change in left chest tube position. Minimal left apical pneumothorax. Bilateral pleural effusions, larger on the right than on the left. Patchy infiltrate left upper to mid lung zone stable. No new opacity. No change in cardiac silhouette.   Electronically Signed   By: Lowella Grip III M.D.   On: 07/06/2014 08:00   Dg Chest Port 1 View  07/05/2014   CLINICAL DATA:  Subsequent encounter for pneumothorax with chest view  EXAM: PORTABLE CHEST - 1 VIEW  COMPARISON:  07/05/2014.  FINDINGS: 1249 hours. Left-sided chest tube is new in the interval. Left Port-A-Cath again noted with distal tip position overlying the mid to distal SVC. Hazy opacity overlies the left mid lung, potentially secondary to re-expansion edema and is this area of the left lung was clear on the CT scan of 06/29/2014. Bibasilar atelectasis noted with possible tiny bilateral pleural effusions. Subcutaneous emphysema is seen in the left chest wall.  IMPRESSION: Re-expansion of left lung status post left chest tube placement. No evidence for residual pneumothorax.  Central opacity overlying the left mid lung may be related to re-expansion edema given that this area of the left lung was clear on recent CT scan.   Electronically Signed   By: Misty Stanley M.D.   On: 07/05/2014 13:39   Dg Chest Port 1 View  07/05/2014   ADDENDUM REPORT: 07/05/2014 07:17  ADDENDUM: Critical Value/emergent results were called by telephone at the time of interpretation on 07/05/2014 at 7:17 am to Dr. Hilbert Odor, who verbally acknowledged these results.   Electronically Signed   By: Lowella Grip III M.D.   On: 07/05/2014 07:17   07/05/2014   CLINICAL DATA:  Fever and cough  EXAM: PORTABLE CHEST - 1 VIEW  COMPARISON:  July 04, 2014  FINDINGS: There is a large pneumothorax on the left without appreciable tension component. There is consolidation in the collapsed left upper lobe. The right lung is clear. The heart is upper normal in size with pulmonary vascular  within normal limits. Port-A-Cath tip is in the superior vena cava. Note that there is subcutaneous air in the supraclavicular region on the left.  IMPRESSION: Large left pneumothorax without tension component. Consolidation in the collapsed left upper lobe. Extensive subcutaneous air on the left. Right lung clear. No change in cardiac silhouette.  Electronically Signed: By: Lowella Grip III M.D. On: 07/05/2014 07:13   Dg Chest Port 1 View  07/04/2014   CLINICAL DATA:  Port-A-Cath placement.  Metastatic colon cancer.  EXAM: PORTABLE CHEST - 1 VIEW  COMPARISON:  06/29/2014  FINDINGS: Left-sided power injectable Port-A-Cath noted, tip projecting over the SVC. No pneumothorax. Indistinct linear opacities at both lung bases, potentially subsegmental atelectasis. The heart size within normal limits for technique.  IMPRESSION: 1. Left Port-A-Cath tip:  SVC.  No pneumothorax. 2. Bibasilar subsegmental atelectasis or scar.   Electronically Signed  By: Van Clines M.D.   On: 07/04/2014 14:33   Dg Fluoro Guide Cv Line-no Report  07/04/2014   CLINICAL DATA:    FLOURO GUIDE CV LINE  Fluoroscopy was utilized by the requesting physician.  No radiographic  interpretation.     Anti-infectives: Anti-infectives    Start     Dose/Rate Route Frequency Ordered Stop   07/01/14 0900  imipenem-cilastatin (PRIMAXIN) 250 mg in sodium chloride 0.9 % 100 mL IVPB  Status:  Discontinued     250 mg 200 mL/hr over 30 Minutes Intravenous 4 times per day 07/01/14 0825 07/05/14 0652   07/01/14 0815  imipenem-cilastatin (PRIMAXIN) 500 mg in sodium chloride 0.9 % 100 mL IVPB  Status:  Discontinued     500 mg 200 mL/hr over 30 Minutes Intravenous 3 times per day 07/01/14 0801 07/01/14 0825   06/30/14 2200  cefTRIAXone (ROCEPHIN) 1 g in dextrose 5 % 50 mL IVPB - Premix  Status:  Discontinued     1 g 100 mL/hr over 30 Minutes Intravenous Every 12 hours 06/30/14 1712 06/30/14 1712   06/30/14 1715  metroNIDAZOLE (FLAGYL) IVPB  500 mg  Status:  Discontinued     500 mg 100 mL/hr over 60 Minutes Intravenous Every 8 hours 06/30/14 1712 06/30/14 1712       Assessment/Plan   Fever and worsening symptoms following colonoscopic stenting of malignant colon stricture on 06/30/2014.  -Fevers resolved but WBC slightly elevated today at 12K -There may have been a microperforation, but no perforation on CT -D/c antibiotics  -no BM in 2 days, refusing enema today due to significant weakness  POD 2, Left pneumothorax s/p port-a-cath placement Dr Lucia Gaskins on 07/04/14 -Placed left thoracostomy tube 20 french yesterday.  CXR looks good today.  No air leak on exam.  Will place the patient on water seal.  If CXR looks good in am, hopefully we can DC CT tomorrow.  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 -Follow-up with Dr. Julieanne Manson regarding management of her systemic disease for disease control -Pending liver biopsy results  Long history of Crohn's disease.  LOS: 7 days    OSBORNE,KELLY E 07/06/2014, 11:21 AM Pager: [phone]  Agree with above. Does not feel good. Husband in room.  Alphonsa Overall, MD, [facility] Pager: [phone] Office phone:  [phone]

## 2014-07-06 NOTE — Progress Notes (Signed)
Subjective: Feeling poorly.  Very fatigued.  She still has issues with SOB.  Objective: Vital signs in last 24 hours: Temp:  [97.4 F (36.3 C)-99.1 F (37.3 C)] 98.6 F (37 C) (04/07 1520) Pulse Rate:  [17-90] 90 (04/07 1520) Resp:  [16-21] 16 (04/07 1520) BP: (126-141)/(55-65) 137/57 mmHg (04/07 1520) SpO2:  [94 %-99 %] 94 % (04/07 1520) Last BM Date: 07/03/14  Intake/Output from previous day: 04/06 0701 - 04/07 0700 In: 6305.8 [P.O.:360; I.V.:5945.8] Out: 230 [Chest Tube:230] Intake/Output this shift:    General appearance: alert and fatigued Resp: clear to auscultation bilaterally GI: soft, distended, and tympanic  Lab Results:  Recent Labs  07/05/14 0407 07/06/14 0540  WBC 9.2 12.3*  HGB 8.9* 8.9*  HCT 28.0* 27.7*  PLT 155 122*   BMET  Recent Labs  07/05/14 0407 07/06/14 0540  NA 139 138  K 3.5 3.7  CL 107 107  CO2 26 25  GLUCOSE 117* 121*  BUN <5* <5*  CREATININE 0.57 0.52  CALCIUM 8.0* 7.8*   LFT No results for input(s): PROT, ALBUMIN, AST, ALT, ALKPHOS, BILITOT, BILIDIR, IBILI in the last 72 hours. PT/INR  Recent Labs  07/05/14 0407  LABPROT 15.4*  INR 1.20   Hepatitis Panel No results for input(s): HEPBSAG, HCVAB, HEPAIGM, HEPBIGM in the last 72 hours. C-Diff No results for input(s): CDIFFTOX in the last 72 hours. Fecal Lactopherrin No results for input(s): FECLLACTOFRN in the last 72 hours.  Studies/Results: US Biopsy  07/05/2014   CLINICAL DATA:  Rectosigmoid carcinoma with multiple liver lesions likely representing metastatic disease. Request has been made to biopsy one of the liver lesions.  EXAM: ULTRASOUND GUIDED CORE BIOPSY OF LIVER  MEDICATIONS: 0.5 mg IV Versed; 12.5 mcg IV Fentanyl  Total Moderate Sedation Time: 11 minutes  PROCEDURE: The procedure, risks, benefits, and alternatives were explained to the patient. Questions regarding the procedure were encouraged and answered. The patient understands and consents to the procedure.  A time-out was performed prior to the procedure.  The anterior abdominal wall was prepped with Betadine in a sterile fashion, and a sterile drape was applied covering the operative field. A sterile gown and sterile gloves were used for the procedure. Local anesthesia was provided with 1% Lidocaine.  A lesion was localized in the left lobe of the liver. Under ultrasound guidance, a 17 gauge needle was advanced into the left lobe. Coaxial 18 gauge core biopsy samples were obtained with a total of 4 core biopsy samples obtained and submitted in formalin. The outer needle was removed and additional ultrasound performed.  COMPLICATIONS: None.  FINDINGS: Multiple large lesions are seen throughout the liver. The dominant left lobe lesion was localized and sampled. Solid tissue was obtained. There were no immediate complications.  IMPRESSION: Ultrasound-guided core biopsy performed of a large lesion in the left lobe of the liver.   Electronically Signed   By: Aletta Edouard M.D.   On: 07/05/2014 12:49   Dg Chest Port 1 View  07/06/2014   CLINICAL DATA:  Recent pneumothorax recent pneumothorax with chest tube in place  EXAM: PORTABLE CHEST - 1 VIEW  COMPARISON:  July 05, 2014  FINDINGS: There is no change in left chest tube position. There is a tiny left apical pneumothorax currently. There is subcutaneous air on the left. There is moderate effusion on the left with patchy infiltrate in the left mid lung. There is mild atelectasis in right base with minimal right effusion. Lungs are otherwise clear. Heart is upper  normal in size with pulmonary vascular within normal limits. Port-A-Cath tip in superior vena cava. No adenopathy.  IMPRESSION: No change in left chest tube position. Minimal left apical pneumothorax. Bilateral pleural effusions, larger on the right than on the left. Patchy infiltrate left upper to mid lung zone stable. No new opacity. No change in cardiac silhouette.   Electronically Signed   By: Lowella Grip III M.D.   On: 07/06/2014 08:00   Dg Chest Port 1 View  07/05/2014   CLINICAL DATA:  Subsequent encounter for pneumothorax with chest view  EXAM: PORTABLE CHEST - 1 VIEW  COMPARISON:  07/05/2014.  FINDINGS: 1249 hours. Left-sided chest tube is new in the interval. Left Port-A-Cath again noted with distal tip position overlying the mid to distal SVC. Hazy opacity overlies the left mid lung, potentially secondary to re-expansion edema and is this area of the left lung was clear on the CT scan of 06/29/2014. Bibasilar atelectasis noted with possible tiny bilateral pleural effusions. Subcutaneous emphysema is seen in the left chest wall.  IMPRESSION: Re-expansion of left lung status post left chest tube placement. No evidence for residual pneumothorax.  Central opacity overlying the left mid lung may be related to re-expansion edema given that this area of the left lung was clear on recent CT scan.   Electronically Signed   By: Misty Stanley M.D.   On: 07/05/2014 13:39   Dg Chest Port 1 View  07/05/2014   ADDENDUM REPORT: 07/05/2014 07:17  ADDENDUM: Critical Value/emergent results were called by telephone at the time of interpretation on 07/05/2014 at 7:17 am to Dr. Hilbert Odor, who verbally acknowledged these results.   Electronically Signed   By: Lowella Grip III M.D.   On: 07/05/2014 07:17   07/05/2014   CLINICAL DATA:  Fever and cough  EXAM: PORTABLE CHEST - 1 VIEW  COMPARISON:  July 04, 2014  FINDINGS: There is a large pneumothorax on the left without appreciable tension component. There is consolidation in the collapsed left upper lobe. The right lung is clear. The heart is upper normal in size with pulmonary vascular within normal limits. Port-A-Cath tip is in the superior vena cava. Note that there is subcutaneous air in the supraclavicular region on the left.  IMPRESSION: Large left pneumothorax without tension component. Consolidation in the collapsed left upper lobe. Extensive subcutaneous  air on the left. Right lung clear. No change in cardiac silhouette.  Electronically Signed: By: Lowella Grip III M.D. On: 07/05/2014 07:13    Medications:  Scheduled: . clonazePAM  1 mg Oral QHS  . docusate sodium  100 mg Oral BID  . feeding supplement (ENSURE ENLIVE)  237 mL Oral BID BM  . nicotine  14 mg Transdermal Daily  . polyethylene glycol  17 g Oral Daily  . sodium phosphate  1 enema Rectal Once  . vitamin B-12  1,000 mcg Oral Daily   Continuous: . dextrose 5 % and 0.9 % NaCl with KCl 20 mEq/L 125 mL/hr at 07/06/14 1005    Assessment/Plan: 1) Stage IV rectosigmoid adenocarcinoma. 2) Pneumothorax  - resolved with chest tube. 3) Bilateral pleural effusions. 4) Obstipation versus constipation.   The bilateral pleural effusions are new.  I will give her a couple of doses of diuretics and discontinue her IV fluids.  Hopefully she will improve.  As for her bowel movements, her abdomen appears to be more distended.  It is still soft.  She feels that Miralax worsens her symptoms and I  will discontinue the medication.  She does not want an enema at this time as she is feeling so poorly.  I will obtain a KUB to make sure she does not have an obstruction before starting Amitiza or Linzess.  Plan: 1) D/C IV fluids. 2) Lasix 40 mg PO x 2 doses. 3) KUB.     LOS: 7 days   Morrison Mcbryar D 07/06/2014, 4:17 PM

## 2014-07-07 ENCOUNTER — Encounter: Payer: Self-pay | Admitting: *Deleted

## 2014-07-07 ENCOUNTER — Inpatient Hospital Stay (HOSPITAL_COMMUNITY): Payer: 59

## 2014-07-07 LAB — BASIC METABOLIC PANEL
Anion gap: 7 (ref 5–15)
BUN: 8 mg/dL (ref 6–23)
CO2: 30 mmol/L (ref 19–32)
Calcium: 8.4 mg/dL (ref 8.4–10.5)
Chloride: 100 mmol/L (ref 96–112)
Creatinine, Ser: 0.56 mg/dL (ref 0.50–1.10)
GFR calc Af Amer: >90 mL/min (ref >90)
GFR calc non Af Amer: >90 mL/min (ref >90)
Glucose, Bld: 118 mg/dL — ABNORMAL HIGH (ref 70–99)
POTASSIUM: 3.9 mmol/L (ref 3.5–5.1)
SODIUM: 137 mmol/L (ref 135–145)

## 2014-07-07 LAB — CBC
HCT: 29.7 % — ABNORMAL LOW (ref 36.0–46.0)
HEMOGLOBIN: 9.4 g/dL — AB (ref 12.0–15.0)
MCH: 27.2 pg (ref 26.0–34.0)
MCHC: 31.6 g/dL (ref 30.0–36.0)
MCV: 85.8 fL (ref 78.0–100.0)
Platelets: 150 10*3/uL (ref 150–400)
RBC: 3.46 MIL/uL — ABNORMAL LOW (ref 3.87–5.11)
RDW: 13.6 % (ref 11.5–15.5)
WBC: 12.5 10*3/uL — ABNORMAL HIGH (ref 4.0–10.5)

## 2014-07-07 MED ORDER — LINACLOTIDE 145 MCG PO CAPS
145.0000 ug | ORAL_CAPSULE | Freq: Every day | ORAL | Status: DC
  Administered 2014-07-07 – 2014-07-09 (×3): 145 ug via ORAL
  Filled 2014-07-07 (×5): qty 1

## 2014-07-07 MED ORDER — SODIUM CHLORIDE 0.9 % IV SOLN
250.0000 mg | Freq: Four times a day (QID) | INTRAVENOUS | Status: DC
  Administered 2014-07-07 – 2014-07-10 (×13): 250 mg via INTRAVENOUS
  Filled 2014-07-07 (×15): qty 250

## 2014-07-07 NOTE — [facility] Oncology Navigator Note (Signed)
Requested KRAS testing at Dr. Gearldine Shown request on:  Patient: Sydney Morrison, Sydney Morrison Collected: 07/05/2014 Client: [facility] Accession: [id number] Received: 07/05/2014 Sydney Morrison DOB: 1956/02/04 Age: 59 Gender: F Reported: 07/06/2014 [address] Patient Ph: [phone] MRN #: [id number] [place], [place] [address] Visit #: [id number]

## 2014-07-07 NOTE — Progress Notes (Signed)
Subjective: She doesn't feel so well.  Still with SOB.  Objective: Vital signs in last 24 hours: Temp:  [98.6 F (37 C)-100.5 F (38.1 C)] 99.6 F (37.6 C) (04/08 0601) Pulse Rate:  [86-90] 86 (04/08 0500) Resp:  [16-18] 16 (04/08 0500) BP: (129-157)/(57-65) 157/65 mmHg (04/08 0500) SpO2:  [94 %-98 %] 96 % (04/08 0500) Last BM Date: 07/06/14  Intake/Output from previous day: 04/07 0701 - 04/08 0700 In: 240 [P.O.:240] Out: 710 [Urine:500; Chest Tube:210] Intake/Output this shift:    General appearance: alert and no distress Resp: clear to auscultation bilaterally GI: soft, tympanic, appears to be slightly less distended today  Lab Results:  Recent Labs  07/05/14 0407 07/06/14 0540 07/07/14 0908  WBC 9.2 12.3* 12.5*  HGB 8.9* 8.9* 9.4*  HCT 28.0* 27.7* 29.7*  PLT 155 122* 150   BMET  Recent Labs  07/05/14 0407 07/06/14 0540 07/07/14 0908  NA 139 138 137  K 3.5 3.7 3.9  CL 107 107 100  CO2 26 25 30   GLUCOSE 117* 121* 118*  BUN <5* <5* 8  CREATININE 0.57 0.52 0.56  CALCIUM 8.0* 7.8* 8.4   LFT No results for input(s): PROT, ALBUMIN, AST, ALT, ALKPHOS, BILITOT, BILIDIR, IBILI in the last 72 hours. PT/INR  Recent Labs  07/05/14 0407  LABPROT 15.4*  INR 1.20   Hepatitis Panel No results for input(s): HEPBSAG, HCVAB, HEPAIGM, HEPBIGM in the last 72 hours. C-Diff No results for input(s): CDIFFTOX in the last 72 hours. Fecal Lactopherrin No results for input(s): FECLLACTOFRN in the last 72 hours.  Studies/Results: Dg Abd 1 View  07/06/2014   CLINICAL DATA:  Constipation for 5 days. History of colon cancer. Initial encounter.  EXAM: ABDOMEN - 1 VIEW  COMPARISON:  Abdominal CT 07/02/2014.  FINDINGS: There is contrast material within the colon from the recent CT. There is no evidence of extraluminal contrast or bowel obstruction. The bowel remains inferiorly displaced by the enlarged liver. Intraluminal stent within the sigmoid colon appears unchanged in  position with mild waisting.  IMPRESSION: Although the contrast from the recent CT has not passed from the colon, there is no significant distention to suggest high-grade obstruction at the level of the sigmoid colon cancer status post stenting. No evidence of perforation.   Electronically Signed   By: Richardean Sale M.D.   On: 07/06/2014 20:23   Dg Chest Port 1 View  07/07/2014   CLINICAL DATA:  Status post stenting for colon carcinoma. Left pneumothorax after Port-A-Cath placement.  EXAM: PORTABLE CHEST - 1 VIEW  COMPARISON:  Single view of the chest 07/05/2014 and 07/06/2014.  FINDINGS: Left Port-A-Cath is unchanged. Left chest tube remains in place. There is a small left apical pneumothorax with an air-fluid level seen in the lower left chest. Aeration appears improved bilaterally with decreased effusions. No right pneumothorax is seen. Small right effusion is noted.  IMPRESSION: Small left hydro pneumothorax with a chest tube in place. The pneumothorax is slightly increased in size.  Improved aeration bilaterally.  Small right pleural effusion.   Electronically Signed   By: Inge Rise M.D.   On: 07/07/2014 07:49   Dg Chest Port 1 View  07/06/2014   CLINICAL DATA:  Recent pneumothorax recent pneumothorax with chest tube in place  EXAM: PORTABLE CHEST - 1 VIEW  COMPARISON:  July 05, 2014  FINDINGS: There is no change in left chest tube position. There is a tiny left apical pneumothorax currently. There is subcutaneous air on the left.  There is moderate effusion on the left with patchy infiltrate in the left mid lung. There is mild atelectasis in right base with minimal right effusion. Lungs are otherwise clear. Heart is upper normal in size with pulmonary vascular within normal limits. Port-A-Cath tip in superior vena cava. No adenopathy.  IMPRESSION: No change in left chest tube position. Minimal left apical pneumothorax. Bilateral pleural effusions, larger on the right than on the left. Patchy  infiltrate left upper to mid lung zone stable. No new opacity. No change in cardiac silhouette.   Electronically Signed   By: Lowella Grip III M.D.   On: 07/06/2014 08:00   Dg Chest Port 1 View  07/05/2014   CLINICAL DATA:  Subsequent encounter for pneumothorax with chest view  EXAM: PORTABLE CHEST - 1 VIEW  COMPARISON:  07/05/2014.  FINDINGS: 1249 hours. Left-sided chest tube is new in the interval. Left Port-A-Cath again noted with distal tip position overlying the mid to distal SVC. Hazy opacity overlies the left mid lung, potentially secondary to re-expansion edema and is this area of the left lung was clear on the CT scan of 06/29/2014. Bibasilar atelectasis noted with possible tiny bilateral pleural effusions. Subcutaneous emphysema is seen in the left chest wall.  IMPRESSION: Re-expansion of left lung status post left chest tube placement. No evidence for residual pneumothorax.  Central opacity overlying the left mid lung may be related to re-expansion edema given that this area of the left lung was clear on recent CT scan.   Electronically Signed   By: Misty Stanley M.D.   On: 07/05/2014 13:39    Medications:  Scheduled: . clonazePAM  1 mg Oral QHS  . docusate sodium  100 mg Oral BID  . feeding supplement (ENSURE ENLIVE)  237 mL Oral BID BM  . imipenem-cilastatin  250 mg Intravenous 4 times per day  . Linaclotide  145 mcg Oral Daily  . nicotine  14 mg Transdermal Daily  . sodium phosphate  1 enema Rectal Once  . vitamin B-12  1,000 mcg Oral Daily   Continuous:   Assessment/Plan: 1) Stage IV rectosigmoid adenocarcinoma. 2) Pneumothorax - Continue with chest tube. 3) Bilateral pleural effusions - improved. 4) Obstipation versus constipation.   CXR reveals a mildly larger pneumonthorax.  She needs to continue with her chest tube.  The CXR does reveal an interval improvement in her pleural effusions.  Overnight she had a Tmax of 100.5 and her WBC remains elevated.   KUB does not  reveal an obstructive pattern and last evening she had diarrhea at 2:30 AM.  No solid bowel movements.  Her stent on the KUB appears to be patent.  Plan: 1) Restart Primaxin. 2) Continue with chest tube. 3) Trial of Linzess. 4) Per the patient, Dr. Benay Spice states that if she is still in the hospital over the weekend, she will be transferred to [facility] for chemotherapy next week.  LOS: 8 days   Giannah Zavadil D 07/07/2014, 11:26 AM

## 2014-07-07 NOTE — Progress Notes (Signed)
Patient ID: Sydney Morrison, female   DOB: 07-29-1955, 59 y.o.   MRN: [id number]     [place]      [address] [place], [place] [address]    Phone: [phone] FAX: [phone]     Subjective: Pain at CT site.  DOE or with talking.  sats are stable.  Pulling only 6106m on IS.  Passing flatus.  No bm.  Temp 100.5.   Objective:  Vital signs:  Filed Vitals:   07/06/14 1520 07/06/14 2128 07/07/14 0500 07/07/14 0601  BP: 137/57 129/57 157/65   Pulse: 90 86 86   Temp: 98.6 F (37 C) 99.3 F (37.4 C) 100.5 F (38.1 C) 99.6 F (37.6 C)  TempSrc: Oral Oral Oral Oral  Resp: _0 Height:      Weight:      SpO2: 94% 98% 96%     Last BM Date: 07/06/14  Intake/Output   Yesterday:  04/07 0701 - 04/08 0700 In: 240 [P.O.:240] Out: 710 [Urine:500; Chest Tube:210] This shift: I/O last 3 completed shifts: In: 2542.5 [P.O.:480; I.V.:2062.5] Out: 780 [Urine:500; Chest Tube:280]    Physical Exam: General: Pt awake/alert/oriented x4 in no acute distress Chest: cta. No chest wall pain w good excursion.  Lt CT no air leak.  CV:  Pulses intact.  Regular rhythm MS: Normal AROM mjr joints.  No obvious deformity Abdomen: Soft.  Nondistended.  Non tender  No evidence of peritonitis.  No incarcerated hernias. Ext:  SCDs BLE.  No mjr edema.  No cyanosis Skin: No petechiae / purpura   Problem List:   Active Problems:   Colon cancer   Fever   Chronic blood loss anemia   Liver lesion    Results:   Labs: Results for orders placed or performed during the hospital encounter of 06/29/14 (from the past 48 hour(s))  CBC     Status: Abnormal   Collection Time: 07/06/14  5:40 AM  Result Value Ref Range   WBC 12.3 (H) 4.0 - 10.5 K/uL   RBC 3.18 (L) 3.87 - 5.11 MIL/uL   Hemoglobin 8.9 (L) 12.0 - 15.0 g/dL   HCT 27.7 (L) 36.0 - 46.0 %   MCV 87.1 78.0 - 100.0 fL   MCH 28.0 26.0 - 34.0 pg   MCHC 32.1 30.0 - 36.0 g/dL   RDW 13.6  11.5 - 15.5 %   Platelets 122 (L) 150 - 400 K/uL  Basic metabolic panel     Status: Abnormal   Collection Time: 07/06/14  5:40 AM  Result Value Ref Range   Sodium 138 135 - 145 mmol/L   Potassium 3.7 3.5 - 5.1 mmol/L   Chloride 107 96 - 112 mmol/L   CO2 25 19 - 32 mmol/L   Glucose, Bld 121 (H) 70 - 99 mg/dL   BUN <5 (L) 6 - 23 mg/dL   Creatinine, Ser 0.52 0.50 - 1.10 mg/dL   Calcium 7.8 (L) 8.4 - 10.5 mg/dL   GFR calc non Af Amer >90 >90 mL/min   GFR calc Af Amer >90 >90 mL/min    Comment: (NOTE) The eGFR has been calculated using the CKD EPI equation. This calculation has not been validated in all clinical situations. eGFR's persistently <90 mL/min signify possible Chronic Kidney Disease.    Anion gap 6 5 - 15    Imaging / Studies: Dg Abd 1 View  07/06/2014   CLINICAL DATA:  Constipation for 5  days. History of colon cancer. Initial encounter.  EXAM: ABDOMEN - 1 VIEW  COMPARISON:  Abdominal CT 07/02/2014.  FINDINGS: There is contrast material within the colon from the recent CT. There is no evidence of extraluminal contrast or bowel obstruction. The bowel remains inferiorly displaced by the enlarged liver. Intraluminal stent within the sigmoid colon appears unchanged in position with mild waisting.  IMPRESSION: Although the contrast from the recent CT has not passed from the colon, there is no significant distention to suggest high-grade obstruction at the level of the sigmoid colon cancer status post stenting. No evidence of perforation.   Electronically Signed   By: Richardean Sale M.D.   On: 07/06/2014 20:23   US Biopsy  07/05/2014   CLINICAL DATA:  Rectosigmoid carcinoma with multiple liver lesions likely representing metastatic disease. Request has been made to biopsy one of the liver lesions.  EXAM: ULTRASOUND GUIDED CORE BIOPSY OF LIVER  MEDICATIONS: 0.5 mg IV Versed; 12.5 mcg IV Fentanyl  Total Moderate Sedation Time: 11 minutes  PROCEDURE: The procedure, risks, benefits, and  alternatives were explained to the patient. Questions regarding the procedure were encouraged and answered. The patient understands and consents to the procedure. A time-out was performed prior to the procedure.  The anterior abdominal wall was prepped with Betadine in a sterile fashion, and a sterile drape was applied covering the operative field. A sterile gown and sterile gloves were used for the procedure. Local anesthesia was provided with 1% Lidocaine.  A lesion was localized in the left lobe of the liver. Under ultrasound guidance, a 17 gauge needle was advanced into the left lobe. Coaxial 18 gauge core biopsy samples were obtained with a total of 4 core biopsy samples obtained and submitted in formalin. The outer needle was removed and additional ultrasound performed.  COMPLICATIONS: None.  FINDINGS: Multiple large lesions are seen throughout the liver. The dominant left lobe lesion was localized and sampled. Solid tissue was obtained. There were no immediate complications.  IMPRESSION: Ultrasound-guided core biopsy performed of a large lesion in the left lobe of the liver.   Electronically Signed   By: Aletta Edouard M.D.   On: 07/05/2014 12:49   Dg Chest Port 1 View  07/07/2014   CLINICAL DATA:  Status post stenting for colon carcinoma. Left pneumothorax after Port-A-Cath placement.  EXAM: PORTABLE CHEST - 1 VIEW  COMPARISON:  Single view of the chest 07/05/2014 and 07/06/2014.  FINDINGS: Left Port-A-Cath is unchanged. Left chest tube remains in place. There is a small left apical pneumothorax with an air-fluid level seen in the lower left chest. Aeration appears improved bilaterally with decreased effusions. No right pneumothorax is seen. Small right effusion is noted.  IMPRESSION: Small left hydro pneumothorax with a chest tube in place. The pneumothorax is slightly increased in size.  Improved aeration bilaterally.  Small right pleural effusion.   Electronically Signed   By: Inge Rise M.D.    On: 07/07/2014 07:49   Dg Chest Port 1 View  07/06/2014   CLINICAL DATA:  Recent pneumothorax recent pneumothorax with chest tube in place  EXAM: PORTABLE CHEST - 1 VIEW  COMPARISON:  July 05, 2014  FINDINGS: There is no change in left chest tube position. There is a tiny left apical pneumothorax currently. There is subcutaneous air on the left. There is moderate effusion on the left with patchy infiltrate in the left mid lung. There is mild atelectasis in right base with minimal right effusion. Lungs are otherwise clear.  Heart is upper normal in size with pulmonary vascular within normal limits. Port-A-Cath tip in superior vena cava. No adenopathy.  IMPRESSION: No change in left chest tube position. Minimal left apical pneumothorax. Bilateral pleural effusions, larger on the right than on the left. Patchy infiltrate left upper to mid lung zone stable. No new opacity. No change in cardiac silhouette.   Electronically Signed   By: Lowella Grip III M.D.   On: 07/06/2014 08:00   Dg Chest Port 1 View  07/05/2014   CLINICAL DATA:  Subsequent encounter for pneumothorax with chest view  EXAM: PORTABLE CHEST - 1 VIEW  COMPARISON:  07/05/2014.  FINDINGS: 1249 hours. Left-sided chest tube is new in the interval. Left Port-A-Cath again noted with distal tip position overlying the mid to distal SVC. Hazy opacity overlies the left mid lung, potentially secondary to re-expansion edema and is this area of the left lung was clear on the CT scan of 06/29/2014. Bibasilar atelectasis noted with possible tiny bilateral pleural effusions. Subcutaneous emphysema is seen in the left chest wall.  IMPRESSION: Re-expansion of left lung status post left chest tube placement. No evidence for residual pneumothorax.  Central opacity overlying the left mid lung may be related to re-expansion edema given that this area of the left lung was clear on recent CT scan.   Electronically Signed   By: Misty Stanley M.D.   On: 07/05/2014 13:39     Medications / Allergies:  Scheduled Meds: . clonazePAM  1 mg Oral QHS  . docusate sodium  100 mg Oral BID  . feeding supplement (ENSURE ENLIVE)  237 mL Oral BID BM  . nicotine  14 mg Transdermal Daily  . sodium phosphate  1 enema Rectal Once  . vitamin B-12  1,000 mcg Oral Daily   Continuous Infusions:  PRN Meds:.acetaminophen, calcium carbonate, HYDROcodone-acetaminophen, promethazine  Antibiotics: Anti-infectives    Start     Dose/Rate Route Frequency Ordered Stop   07/01/14 0900  imipenem-cilastatin (PRIMAXIN) 250 mg in sodium chloride 0.9 % 100 mL IVPB  Status:  Discontinued     250 mg 200 mL/hr over 30 Minutes Intravenous 4 times per day 07/01/14 0825 07/05/14 0652   07/01/14 0815  imipenem-cilastatin (PRIMAXIN) 500 mg in sodium chloride 0.9 % 100 mL IVPB  Status:  Discontinued     500 mg 200 mL/hr over 30 Minutes Intravenous 3 times per day 07/01/14 0801 07/01/14 0825   06/30/14 2200  cefTRIAXone (ROCEPHIN) 1 g in dextrose 5 % 50 mL IVPB - Premix  Status:  Discontinued     1 g 100 mL/hr over 30 Minutes Intravenous Every 12 hours 06/30/14 1712 06/30/14 1712   06/30/14 1715  metroNIDAZOLE (FLAGYL) IVPB 500 mg  Status:  Discontinued     500 mg 100 mL/hr over 60 Minutes Intravenous Every 8 hours 06/30/14 1712 06/30/14 1712       Assessment/Plan  Fever and worsening symptoms following colonoscopic stenting of malignant colon stricture on 06/30/2014.  -temp 100.5, likely from lack of mobilization and IS use. Monitor CBC -There may have been a microperforation, but no perforation on CT  -no BM in 3 days, refusing enema, passing flatus.   POD 3, Left pneumothorax s/p port-a-cath placement Dr Lucia Gaskins on 07/04/14 -CT placed 4/6.  On water seal.  No air leak.  263m out 24h, but serous.  CXR with a small apical PTX, but a bit bigger today.  I would favor leaving CT in for another 24h and repeat CXR  in AM.  Will discuss with Dr.  Lucia Gaskins for final recs.  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 -Follow-up with Dr. Julieanne Manson regarding management of her systemic disease for disease control -liver biopsy show metastatic adenocarcinoma, I did not discuss this with the patient.    Long history of Crohn's disease.   Erby Pian, [facility] Pager [phone]) For consults and floor pages call [phone](7A-4:30P)  07/07/2014 10:28 AM

## 2014-07-08 ENCOUNTER — Inpatient Hospital Stay (HOSPITAL_COMMUNITY): Payer: 59

## 2014-07-08 LAB — BASIC METABOLIC PANEL
Anion gap: 11 (ref 5–15)
BUN: 12 mg/dL (ref 6–23)
CHLORIDE: 99 mmol/L (ref 96–112)
CO2: 25 mmol/L (ref 19–32)
CREATININE: 0.54 mg/dL (ref 0.50–1.10)
Calcium: 8.1 mg/dL — ABNORMAL LOW (ref 8.4–10.5)
GFR calc Af Amer: >90 mL/min (ref >90)
GLUCOSE: 98 mg/dL (ref 70–99)
Potassium: 3.2 mmol/L — ABNORMAL LOW (ref 3.5–5.1)
Sodium: 135 mmol/L (ref 135–145)

## 2014-07-08 LAB — CBC
HEMATOCRIT: 26.2 % — AB (ref 36.0–46.0)
HEMOGLOBIN: 8.4 g/dL — AB (ref 12.0–15.0)
MCH: 27.7 pg (ref 26.0–34.0)
MCHC: 32.1 g/dL (ref 30.0–36.0)
MCV: 86.5 fL (ref 78.0–100.0)
Platelets: 132 10*3/uL — ABNORMAL LOW (ref 150–400)
RBC: 3.03 MIL/uL — ABNORMAL LOW (ref 3.87–5.11)
RDW: 13.6 % (ref 11.5–15.5)
WBC: 9.9 10*3/uL (ref 4.0–10.5)

## 2014-07-08 MED ORDER — HEPARIN SODIUM (PORCINE) 5000 UNIT/ML IJ SOLN
5000.0000 [IU] | Freq: Three times a day (TID) | INTRAMUSCULAR | Status: DC
Start: 2014-07-08 — End: 2014-07-11
  Administered 2014-07-08 – 2014-07-11 (×10): 5000 [IU] via SUBCUTANEOUS
  Filled 2014-07-08 (×10): qty 1

## 2014-07-08 MED ORDER — POTASSIUM CHLORIDE CRYS ER 20 MEQ PO TBCR
40.0000 meq | EXTENDED_RELEASE_TABLET | Freq: Two times a day (BID) | ORAL | Status: AC
  Administered 2014-07-08 (×2): 40 meq via ORAL
  Filled 2014-07-08 (×2): qty 2

## 2014-07-08 NOTE — Progress Notes (Signed)
Subjective: She had some solid bowel movement.  She still reports that her breathing is not back to her baseline.  Objective: Vital signs in last 24 hours: Temp:  [98.1 F (36.7 C)-100.2 F (37.9 C)] 98.9 F (37.2 C) (04/09 0528) Pulse Rate:  [42-79] 79 (04/09 0528) Resp:  [16-18] 18 (04/09 0528) BP: (106-118)/(51-56) 116/51 mmHg (04/09 0528) SpO2:  [98 %] 98 % (04/09 0528) Last BM Date: 07/06/14  Intake/Output from previous day: 04/08 0701 - 04/09 0700 In: 640 [P.O.:240; IV Piggyback:400] Out: 40 [Chest Tube:40] Intake/Output this shift: Total I/O In: -  Out: 200 [Urine:200]  General appearance: alert and no distress GI: soft, non-tender; bowel sounds normal; no masses,  no organomegaly  Lab Results:  Recent Labs  07/06/14 0540 07/07/14 0908 07/08/14 0521  WBC 12.3* 12.5* 9.9  HGB 8.9* 9.4* 8.4*  HCT 27.7* 29.7* 26.2*  PLT 122* 150 132*   BMET  Recent Labs  07/06/14 0540 07/07/14 0908 07/08/14 0521  NA 138 137 135  K 3.7 3.9 3.2*  CL 107 100 99  CO2 25 30 25   GLUCOSE 121* 118* 98  BUN <5* 8 12  CREATININE 0.52 0.56 0.54  CALCIUM 7.8* 8.4 8.1*   LFT No results for input(s): PROT, ALBUMIN, AST, ALT, ALKPHOS, BILITOT, BILIDIR, IBILI in the last 72 hours. PT/INR No results for input(s): LABPROT, INR in the last 72 hours. Hepatitis Panel No results for input(s): HEPBSAG, HCVAB, HEPAIGM, HEPBIGM in the last 72 hours. C-Diff No results for input(s): CDIFFTOX in the last 72 hours. Fecal Lactopherrin No results for input(s): FECLLACTOFRN in the last 72 hours.  Studies/Results: Dg Chest 2 View  07/08/2014   CLINICAL DATA:  Short of breath, left breast pain for 2 days  EXAM: CHEST  2 VIEW  COMPARISON:  Radiograph 07/07/2014  FINDINGS: Left-sided power port unchanged. Left chest tube in place without clear pneumothorax. Left lower lobe nodular airspace opacity again noted. Left effusion unchanged. No pneumothorax appreciated.  IMPRESSION: 1. Left chest tube in  place without pneumothorax appreciated. 2. Left lower lobe pneumonia and effusion unchanged.   Electronically Signed   By: Suzy Bouchard M.D.   On: 07/08/2014 07:40   Dg Abd 1 View  07/06/2014   CLINICAL DATA:  Constipation for 5 days. History of colon cancer. Initial encounter.  EXAM: ABDOMEN - 1 VIEW  COMPARISON:  Abdominal CT 07/02/2014.  FINDINGS: There is contrast material within the colon from the recent CT. There is no evidence of extraluminal contrast or bowel obstruction. The bowel remains inferiorly displaced by the enlarged liver. Intraluminal stent within the sigmoid colon appears unchanged in position with mild waisting.  IMPRESSION: Although the contrast from the recent CT has not passed from the colon, there is no significant distention to suggest high-grade obstruction at the level of the sigmoid colon cancer status post stenting. No evidence of perforation.   Electronically Signed   By: Richardean Sale M.D.   On: 07/06/2014 20:23   Dg Chest Port 1 View  07/07/2014   CLINICAL DATA:  Status post stenting for colon carcinoma. Left pneumothorax after Port-A-Cath placement.  EXAM: PORTABLE CHEST - 1 VIEW  COMPARISON:  Single view of the chest 07/05/2014 and 07/06/2014.  FINDINGS: Left Port-A-Cath is unchanged. Left chest tube remains in place. There is a small left apical pneumothorax with an air-fluid level seen in the lower left chest. Aeration appears improved bilaterally with decreased effusions. No right pneumothorax is seen. Small right effusion is noted.  IMPRESSION: Small left hydro pneumothorax with a chest tube in place. The pneumothorax is slightly increased in size.  Improved aeration bilaterally.  Small right pleural effusion.   Electronically Signed   By: Inge Rise M.D.   On: 07/07/2014 07:49    Medications:  Scheduled: . clonazePAM  1 mg Oral QHS  . docusate sodium  100 mg Oral BID  . feeding supplement (ENSURE ENLIVE)  237 mL Oral BID BM  . heparin subcutaneous   5,000 Units Subcutaneous 3 times per day  . imipenem-cilastatin  250 mg Intravenous 4 times per day  . Linaclotide  145 mcg Oral Daily  . nicotine  14 mg Transdermal Daily  . potassium chloride  40 mEq Oral BID  . sodium phosphate  1 enema Rectal Once  . vitamin B-12  1,000 mcg Oral Daily   Continuous:   Assessment/Plan: 1) Stage IV rectosigmoid adenocarcinoma. 2) Pneumothorax - Resolved on CXR this AM.  Chest tube management per Surgery. 3) Constipation.   She has improved from the bowel movement standpoint.  It was more liquid than solid, but Linzess did help with her bowel movements.  Her abdomen is less distended, but still tympanic.  CXR reports a resolved pneumothorax.    Plan: 1) Continue with Linzess. 2) Chest tube per Surgery. 3) Oral KCL for hypokalemia.  LOS: 9 days   Karysa Heft D 07/08/2014, 8:36 AM

## 2014-07-08 NOTE — Progress Notes (Signed)
Patient ID: Sydney Morrison, female   DOB: 08/02/1955, 59 y.o.   MRN: [id number] 4 Days Post-Op  Subjective: Reports she is doing better today. Says she has had flatus and loose bowel movements and her abdomen is feeling better. Tolerating a regular diet. She has some left-sided chest pain but no worse and denies shortness of breath.  Objective: Vital signs in last 24 hours: Temp:  [98.1 F (36.7 C)-100.2 F (37.9 C)] 98.9 F (37.2 C) (04/09 0528) Pulse Rate:  [42-79] 79 (04/09 0528) Resp:  [16-18] 18 (04/09 0528) BP: (106-118)/(51-56) 116/51 mmHg (04/09 0528) SpO2:  [98 %] 98 % (04/09 0528) Last BM Date: 07/06/14  Intake/Output from previous day: 04/08 0701 - 04/09 0700 In: 640 [P.O.:240; IV Piggyback:400] Out: 40 [Chest Tube:40] Intake/Output this shift: Total I/O In: -  Out: 200 [Urine:200]  General appearance: alert, cooperative, no distress and somewhat chronically ill-appearing Resp: clear to auscultation bilaterally and no wheezing or increased work of breathing GI: mild distention but soft and nontender  Lab Results:   Recent Labs  07/07/14 0908 07/08/14 0521  WBC 12.5* 9.9  HGB 9.4* 8.4*  HCT 29.7* 26.2*  PLT 150 132*   BMET  Recent Labs  07/07/14 0908 07/08/14 0521  NA 137 135  K 3.9 3.2*  CL 100 99  CO2 30 25  GLUCOSE 118* 98  BUN 8 12  CREATININE 0.56 0.54  CALCIUM 8.4 8.1*     Studies/Results: Dg Chest 2 View  07/08/2014   CLINICAL DATA:  Short of breath, left breast pain for 2 days  EXAM: CHEST  2 VIEW  COMPARISON:  Radiograph 07/07/2014  FINDINGS: Left-sided power port unchanged. Left chest tube in place without clear pneumothorax. Left lower lobe nodular airspace opacity again noted. Left effusion unchanged. No pneumothorax appreciated.  IMPRESSION: 1. Left chest tube in place without pneumothorax appreciated. 2. Left lower lobe pneumonia and effusion unchanged.   Electronically Signed   By: Suzy Bouchard M.D.   On: 07/08/2014 07:40   Dg  Abd 1 View  07/06/2014   CLINICAL DATA:  Constipation for 5 days. History of colon cancer. Initial encounter.  EXAM: ABDOMEN - 1 VIEW  COMPARISON:  Abdominal CT 07/02/2014.  FINDINGS: There is contrast material within the colon from the recent CT. There is no evidence of extraluminal contrast or bowel obstruction. The bowel remains inferiorly displaced by the enlarged liver. Intraluminal stent within the sigmoid colon appears unchanged in position with mild waisting.  IMPRESSION: Although the contrast from the recent CT has not passed from the colon, there is no significant distention to suggest high-grade obstruction at the level of the sigmoid colon cancer status post stenting. No evidence of perforation.   Electronically Signed   By: Richardean Sale M.D.   On: 07/06/2014 20:23   Dg Chest Port 1 View  07/07/2014   CLINICAL DATA:  Status post stenting for colon carcinoma. Left pneumothorax after Port-A-Cath placement.  EXAM: PORTABLE CHEST - 1 VIEW  COMPARISON:  Single view of the chest 07/05/2014 and 07/06/2014.  FINDINGS: Left Port-A-Cath is unchanged. Left chest tube remains in place. There is a small left apical pneumothorax with an air-fluid level seen in the lower left chest. Aeration appears improved bilaterally with decreased effusions. No right pneumothorax is seen. Small right effusion is noted.  IMPRESSION: Small left hydro pneumothorax with a chest tube in place. The pneumothorax is slightly increased in size.  Improved aeration bilaterally.  Small right pleural effusion.  Electronically Signed   By: Inge Rise M.D.   On: 07/07/2014 07:49    Anti-infectives: Anti-infectives    Start     Dose/Rate Route Frequency Ordered Stop   07/07/14 1200  imipenem-cilastatin (PRIMAXIN) 250 mg in sodium chloride 0.9 % 100 mL IVPB     250 mg 200 mL/hr over 30 Minutes Intravenous 4 times per day 07/07/14 1126     07/01/14 0900  imipenem-cilastatin (PRIMAXIN) 250 mg in sodium chloride 0.9 % 100 mL IVPB   Status:  Discontinued     250 mg 200 mL/hr over 30 Minutes Intravenous 4 times per day 07/01/14 0825 07/05/14 0652   07/01/14 0815  imipenem-cilastatin (PRIMAXIN) 500 mg in sodium chloride 0.9 % 100 mL IVPB  Status:  Discontinued     500 mg 200 mL/hr over 30 Minutes Intravenous 3 times per day 07/01/14 0801 07/01/14 0825   06/30/14 2200  cefTRIAXone (ROCEPHIN) 1 g in dextrose 5 % 50 mL IVPB - Premix  Status:  Discontinued     1 g 100 mL/hr over 30 Minutes Intravenous Every 12 hours 06/30/14 1712 06/30/14 1712   06/30/14 1715  metroNIDAZOLE (FLAGYL) IVPB 500 mg  Status:  Discontinued     500 mg 100 mL/hr over 60 Minutes Intravenous Every 8 hours 06/30/14 1712 06/30/14 1712      Assessment/Plan:  Obstructing colon cancer with liver metastases. Status post stent placement and obstruction seems to be improving clinically. s/p Procedure(s): POWER PORT PLACEMENT Postprocedural pneumothorax. Resolved by chest x-ray and chest tube removed this morning. Follow-up chest x-ray today.    LOS: 9 days    Arshiya Jakes T 07/08/2014

## 2014-07-08 NOTE — Progress Notes (Signed)
Patient refused her stool softener.

## 2014-07-09 LAB — BASIC METABOLIC PANEL
Anion gap: 7 (ref 5–15)
BUN: 10 mg/dL (ref 6–23)
CO2: 27 mmol/L (ref 19–32)
Calcium: 8.4 mg/dL (ref 8.4–10.5)
Chloride: 103 mmol/L (ref 96–112)
Creatinine, Ser: 0.58 mg/dL (ref 0.50–1.10)
GFR calc non Af Amer: >90 mL/min (ref >90)
Glucose, Bld: 96 mg/dL (ref 70–99)
POTASSIUM: 3.9 mmol/L (ref 3.5–5.1)
Sodium: 137 mmol/L (ref 135–145)

## 2014-07-09 LAB — CBC
HCT: 27.4 % — ABNORMAL LOW (ref 36.0–46.0)
Hemoglobin: 8.8 g/dL — ABNORMAL LOW (ref 12.0–15.0)
MCH: 27.5 pg (ref 26.0–34.0)
MCHC: 32.1 g/dL (ref 30.0–36.0)
MCV: 85.6 fL (ref 78.0–100.0)
PLATELETS: 188 10*3/uL (ref 150–400)
RBC: 3.2 MIL/uL — ABNORMAL LOW (ref 3.87–5.11)
RDW: 13.7 % (ref 11.5–15.5)
WBC: 6.8 10*3/uL (ref 4.0–10.5)

## 2014-07-09 NOTE — Progress Notes (Signed)
Patient ID: Sydney Morrison, female   DOB: 22-Mar-1956, 59 y.o.   MRN: [id number] 5 Days Post-Op  Subjective: No major complaints. No chest pain or shortness of breath at rest. No bowel movements or flatus yesterday and her abdomen feels a little more bloated though no abdominal pain or nausea  Objective: Vital signs in last 24 hours: Temp:  [97.8 F (36.6 C)-99.4 F (37.4 C)] 98.7 F (37.1 C) (04/10 0519) Pulse Rate:  [75-86] 75 (04/10 0519) Resp:  [16-17] 16 (04/10 0519) BP: (110-122)/(48-54) 118/48 mmHg (04/10 0519) SpO2:  [98 %-100 %] 98 % (04/10 0519) Last BM Date: 07/07/14  Intake/Output from previous day: 04/09 0701 - 04/10 0700 In: 220 [P.O.:220] Out: 200 [Urine:200] Intake/Output this shift:    General appearance: alert, cooperative and no distress Resp: no wheezing or increased work of breathing GI: mild distention. Soft and nontender. Incision/Wound: Port-A-Cath site clean. Chest tube site dressed and dry.  Lab Results:   Recent Labs  07/07/14 0908 07/08/14 0521  WBC 12.5* 9.9  HGB 9.4* 8.4*  HCT 29.7* 26.2*  PLT 150 132*   BMET  Recent Labs  07/07/14 0908 07/08/14 0521  NA 137 135  K 3.9 3.2*  CL 100 99  CO2 30 25  GLUCOSE 118* 98  BUN 8 12  CREATININE 0.56 0.54  CALCIUM 8.4 8.1*     Studies/Results: Dg Chest 2 View  07/08/2014   CLINICAL DATA:  Short of breath, left breast pain for 2 days  EXAM: CHEST  2 VIEW  COMPARISON:  Radiograph 07/07/2014  FINDINGS: Left-sided power port unchanged. Left chest tube in place without clear pneumothorax. Left lower lobe nodular airspace opacity again noted. Left effusion unchanged. No pneumothorax appreciated.  IMPRESSION: 1. Left chest tube in place without pneumothorax appreciated. 2. Left lower lobe pneumonia and effusion unchanged.   Electronically Signed   By: Suzy Bouchard M.D.   On: 07/08/2014 07:40   Dg Chest Port 1 View  07/08/2014   CLINICAL DATA:  Left chest tubes removal  EXAM: PORTABLE CHEST - 1  VIEW  COMPARISON:  07/08/2014  FINDINGS: Cardiomediastinal silhouette is stable. Left subclavian Port-A-Cath is unchanged in position. Left chest tube has been removed. Persistent small left basilar atelectasis or infiltrate. There is a tiny left apical pneumothorax.  IMPRESSION: Stable left subclavian Port-A-Cath position. Left chest tube has been removed. Persistent small left basilar atelectasis or infiltrate. Tiny left apical pneumothorax.   Electronically Signed   By: Lahoma Crocker M.D.   On: 07/08/2014 10:54    Anti-infectives: Anti-infectives    Start     Dose/Rate Route Frequency Ordered Stop   07/07/14 1200  imipenem-cilastatin (PRIMAXIN) 250 mg in sodium chloride 0.9 % 100 mL IVPB     250 mg 200 mL/hr over 30 Minutes Intravenous 4 times per day 07/07/14 1126     07/01/14 0900  imipenem-cilastatin (PRIMAXIN) 250 mg in sodium chloride 0.9 % 100 mL IVPB  Status:  Discontinued     250 mg 200 mL/hr over 30 Minutes Intravenous 4 times per day 07/01/14 0825 07/05/14 0652   07/01/14 0815  imipenem-cilastatin (PRIMAXIN) 500 mg in sodium chloride 0.9 % 100 mL IVPB  Status:  Discontinued     500 mg 200 mL/hr over 30 Minutes Intravenous 3 times per day 07/01/14 0801 07/01/14 0825   06/30/14 2200  cefTRIAXone (ROCEPHIN) 1 g in dextrose 5 % 50 mL IVPB - Premix  Status:  Discontinued     1 g 100  mL/hr over 30 Minutes Intravenous Every 12 hours 06/30/14 1712 06/30/14 1712   06/30/14 1715  metroNIDAZOLE (FLAGYL) IVPB 500 mg  Status:  Discontinued     500 mg 100 mL/hr over 60 Minutes Intravenous Every 8 hours 06/30/14 1712 06/30/14 1712      Assessment/Plan: s/p Procedure(s): POWER PORT PLACEMENT Status post stent for obstructing rectosigmoid cancer with liver metastases No bowel movements in 24 hours and slight increased distention. Dr. Benson Norway has ordered a barium study Chest tube removed for pneumothorax post-port placement. Very tiny apical pneumo on follow-up chest x-ray. Appears stable and would  repeat x-ray if any symptoms.   LOS: 10 days    Vita Currin T 07/09/2014

## 2014-07-09 NOTE — Progress Notes (Signed)
Subjective: Breathing better, but very tired.  She did not sleep well last evening.  Objective: Vital signs in last 24 hours: Temp:  [97.8 F (36.6 C)-99.4 F (37.4 C)] 98.7 F (37.1 C) (04/10 0519) Pulse Rate:  [75-86] 75 (04/10 0519) Resp:  [16-17] 16 (04/10 0519) BP: (110-122)/(48-54) 118/48 mmHg (04/10 0519) SpO2:  [98 %-100 %] 98 % (04/10 0519) Last BM Date: 07/07/14  Intake/Output from previous day: 04/09 0701 - 04/10 0700 In: 220 [P.O.:220] Out: 200 [Urine:200] Intake/Output this shift:    General appearance: alert and fatigued Resp: clear to auscultation bilaterally Cardio: regular rate and rhythm GI: soft, + BS, tympanic  Lab Results:  Recent Labs  07/07/14 0908 07/08/14 0521  WBC 12.5* 9.9  HGB 9.4* 8.4*  HCT 29.7* 26.2*  PLT 150 132*   BMET  Recent Labs  07/07/14 0908 07/08/14 0521  NA 137 135  K 3.9 3.2*  CL 100 99  CO2 30 25  GLUCOSE 118* 98  BUN 8 12  CREATININE 0.56 0.54  CALCIUM 8.4 8.1*   LFT No results for input(s): PROT, ALBUMIN, AST, ALT, ALKPHOS, BILITOT, BILIDIR, IBILI in the last 72 hours. PT/INR No results for input(s): LABPROT, INR in the last 72 hours. Hepatitis Panel No results for input(s): HEPBSAG, HCVAB, HEPAIGM, HEPBIGM in the last 72 hours. C-Diff No results for input(s): CDIFFTOX in the last 72 hours. Fecal Lactopherrin No results for input(s): FECLLACTOFRN in the last 72 hours.  Studies/Results: Dg Chest 2 View  07/08/2014   CLINICAL DATA:  Short of breath, left breast pain for 2 days  EXAM: CHEST  2 VIEW  COMPARISON:  Radiograph 07/07/2014  FINDINGS: Left-sided power port unchanged. Left chest tube in place without clear pneumothorax. Left lower lobe nodular airspace opacity again noted. Left effusion unchanged. No pneumothorax appreciated.  IMPRESSION: 1. Left chest tube in place without pneumothorax appreciated. 2. Left lower lobe pneumonia and effusion unchanged.   Electronically Signed   By: Suzy Bouchard M.D.    On: 07/08/2014 07:40   Dg Chest Port 1 View  07/08/2014   CLINICAL DATA:  Left chest tubes removal  EXAM: PORTABLE CHEST - 1 VIEW  COMPARISON:  07/08/2014  FINDINGS: Cardiomediastinal silhouette is stable. Left subclavian Port-A-Cath is unchanged in position. Left chest tube has been removed. Persistent small left basilar atelectasis or infiltrate. There is a tiny left apical pneumothorax.  IMPRESSION: Stable left subclavian Port-A-Cath position. Left chest tube has been removed. Persistent small left basilar atelectasis or infiltrate. Tiny left apical pneumothorax.   Electronically Signed   By: Lahoma Crocker M.D.   On: 07/08/2014 10:54    Medications:  Scheduled: . clonazePAM  1 mg Oral QHS  . docusate sodium  100 mg Oral BID  . feeding supplement (ENSURE ENLIVE)  237 mL Oral BID BM  . heparin subcutaneous  5,000 Units Subcutaneous 3 times per day  . imipenem-cilastatin  250 mg Intravenous 4 times per day  . Linaclotide  145 mcg Oral Daily  . nicotine  14 mg Transdermal Daily  . sodium phosphate  1 enema Rectal Once  . 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 2) S/p pneumothorax.   In adequate bowel movements.  She did not have any last evening.  Subjectively her abdomen appears to be a bit more distended this AM.  Plan: 1) Single contrast BE to evaluate for patency of the stent in the  AM. 2) Continue with antibiotics.    LOS: 10 days   Faylene Allerton D 07/09/2014, 7:48 AM

## 2014-07-10 ENCOUNTER — Other Ambulatory Visit: Payer: Self-pay | Admitting: Nurse Practitioner

## 2014-07-10 ENCOUNTER — Ambulatory Visit: Payer: 59

## 2014-07-10 ENCOUNTER — Inpatient Hospital Stay (HOSPITAL_COMMUNITY): Payer: 59

## 2014-07-10 ENCOUNTER — Telehealth: Payer: Self-pay | Admitting: *Deleted

## 2014-07-10 ENCOUNTER — Inpatient Hospital Stay: Payer: 59 | Admitting: Nurse Practitioner

## 2014-07-10 DIAGNOSIS — D500 Iron deficiency anemia secondary to blood loss (chronic): Secondary | ICD-10-CM

## 2014-07-10 LAB — BASIC METABOLIC PANEL
ANION GAP: 10 (ref 5–15)
BUN: 13 mg/dL (ref 6–23)
CALCIUM: 8.2 mg/dL — AB (ref 8.4–10.5)
CO2: 27 mmol/L (ref 19–32)
Chloride: 100 mmol/L (ref 96–112)
Creatinine, Ser: 0.55 mg/dL (ref 0.50–1.10)
GFR calc Af Amer: >90 mL/min (ref >90)
GFR calc non Af Amer: >90 mL/min (ref >90)
GLUCOSE: 92 mg/dL (ref 70–99)
Potassium: 4 mmol/L (ref 3.5–5.1)
SODIUM: 137 mmol/L (ref 135–145)

## 2014-07-10 LAB — CBC
HCT: 27.1 % — ABNORMAL LOW (ref 36.0–46.0)
HEMOGLOBIN: 8.4 g/dL — AB (ref 12.0–15.0)
MCH: 26.7 pg (ref 26.0–34.0)
MCHC: 31 g/dL (ref 30.0–36.0)
MCV: 86 fL (ref 78.0–100.0)
Platelets: 192 10*3/uL (ref 150–400)
RBC: 3.15 MIL/uL — ABNORMAL LOW (ref 3.87–5.11)
RDW: 13.8 % (ref 11.5–15.5)
WBC: 7.4 10*3/uL (ref 4.0–10.5)

## 2014-07-10 MED ORDER — IOHEXOL 300 MG/ML  SOLN
450.0000 mL | Freq: Once | INTRAMUSCULAR | Status: AC | PRN
  Administered 2014-07-10: 450 mL via INTRAVENOUS

## 2014-07-10 MED ORDER — ZOLPIDEM TARTRATE 5 MG PO TABS
5.0000 mg | ORAL_TABLET | Freq: Once | ORAL | Status: AC
  Administered 2014-07-10: 5 mg via ORAL
  Filled 2014-07-10: qty 1

## 2014-07-10 NOTE — Progress Notes (Signed)
Subjective: Still with some SOB, but he states that her pulse ox was at 96% on RA when she was in the restroom.  Husband does not notice any labored breathing.  Objective: Vital signs in last 24 hours: Temp:  [98.2 F (36.8 C)-98.7 F (37.1 C)] 98.2 F (36.8 C) (04/11 0520) Pulse Rate:  [73-75] 75 (04/11 0520) Resp:  [16] 16 (04/11 0520) BP: (111-119)/(50-55) 119/55 mmHg (04/11 0520) SpO2:  [98 %-100 %] 98 % (04/11 0520) Last BM Date: 07/08/14  Intake/Output from previous day: 04/10 0701 - 04/11 0700 In: 1320 [P.O.:1020; IV Piggyback:300] Out: -  Intake/Output this shift:    General appearance: alert and no distress GI: mildly distended, soft, tympanic  Lab Results:  Recent Labs  07/08/14 0521 07/09/14 0911 07/10/14 0608  WBC 9.9 6.8 7.4  HGB 8.4* 8.8* 8.4*  HCT 26.2* 27.4* 27.1*  PLT 132* 188 192   BMET  Recent Labs  07/08/14 0521 07/09/14 0911 07/10/14 0608  NA 135 137 137  K 3.2* 3.9 4.0  CL 99 103 100  CO2 25 27 27   GLUCOSE 98 96 92  BUN 12 10 13   CREATININE 0.54 0.58 0.55  CALCIUM 8.1* 8.4 8.2*   LFT No results for input(s): PROT, ALBUMIN, AST, ALT, ALKPHOS, BILITOT, BILIDIR, IBILI in the last 72 hours. PT/INR No results for input(s): LABPROT, INR in the last 72 hours. Hepatitis Panel No results for input(s): HEPBSAG, HCVAB, HEPAIGM, HEPBIGM in the last 72 hours. C-Diff No results for input(s): CDIFFTOX in the last 72 hours. Fecal Lactopherrin No results for input(s): FECLLACTOFRN in the last 72 hours.  Studies/Results: Dg Colon W/water Sol Cm  07/10/2014   CLINICAL DATA:  Carcinoma colon.  Stent placed in the sigmoid colon.  EXAM: COLON WITH WATER SOLUTION CONTRAST  COMPARISON:  CT scan dated 06/29/2014 and 07/02/2014  FINDINGS: The scout image demonstrates residual contrast in the colon from the prior CT scan. Sigmoid colon stent is present in the mid pelvis.  I slowly instilled water-soluble contrast in the patient's rectum. The stent is  patent. The channel in the mid stent is markedly constricted. There is tumor narrowing the sigmoid portion of the colon just proximal and distal to the stent.  IMPRESSION: The channel in the mid stent is markedly constricted. However, water-soluble contrast does pass through that area. I suspect this narrowing is due to tumor infiltration. There is also tumor encroaching upon the lumen of the colon just proximal and distal to the stent although there is not severe narrowing of the lumen at those sites.   Electronically Signed   By: Lorriane Shire M.D.   On: 07/10/2014 09:21    Medications:  Scheduled: . clonazePAM  1 mg Oral QHS  . docusate sodium  100 mg Oral BID  . feeding supplement (ENSURE ENLIVE)  237 mL Oral BID BM  . heparin subcutaneous  5,000 Units Subcutaneous 3 times per day  . imipenem-cilastatin  250 mg Intravenous 4 times per day  . Linaclotide  145 mcg Oral Daily  . nicotine  14 mg Transdermal Daily  . sodium phosphate  1 enema Rectal Once  . vitamin B-12  1,000 mcg Oral Daily   Continuous:   Assessment/Plan: 1) Metastatic Rectosigmoid colon cancer. 2) History of Crohn's disease.   I reviewed the BE.  There is tumor infiltration through the stent, which is resulting in a luminal narrowing.  She is still able to pass liquid stool.  I discussed with her the options  of placing an overlapping stent, tumor fulguration, or chemotherapy to shrink the tumor.  I think it will be best to initial chemotherapy.  If there is no significant improvement in her bowel movements, I can always place an overlapping stent. Currently her SOB is an issue, but she is able to maintain her saturations.  Additionally, the BE was negative for any perforation.  Plan: 1) D/C antibiotics. 2) Wean off oxygen. 3) Plan for D/C in the AM, if possible.   LOS: 11 days   Lelar Farewell D 07/10/2014, 1:28 PM

## 2014-07-10 NOTE — Progress Notes (Signed)
IP PROGRESS NOTE  Subjective:   She is having loose stool. She reports intermittent nausea. She is ambulating.  Objective: Vital signs in last 24 hours: Blood pressure 119/55, pulse 75, temperature 98.2 F (36.8 C), temperature source Oral, resp. rate 16, height 5\' 7"  (1.702 m), weight 120 lb (54.432 kg), SpO2 98 %.  Intake/Output from previous day: 04/10 0701 - 04/11 0700 In: 1320 [P.O.:1020; IV Piggyback:300] Out: -   Physical examination:   Lungs: Decreased breath sounds at the lower chest, no respiratory distress Cardiac: Regular rate and rhythm Abdomen: Soft and nontender Vascular: No leg edema  Lab Results:  Recent Labs  07/09/14 0911 07/10/14 0608  WBC 6.8 7.4  HGB 8.8* 8.4*  HCT 27.4* 27.1*  PLT 188 192    BMET  Recent Labs  07/09/14 0911 07/10/14 0608  NA 137 137  K 3.9 4.0  CL 103 100  CO2 27 27  GLUCOSE 96 92  BUN 10 13  CREATININE 0.58 0.55  CALCIUM 8.4 8.2*    Studies/Results: Dg Colon W/water Sol Cm  07/10/2014   CLINICAL DATA:  Carcinoma colon.  Stent placed in the sigmoid colon.  EXAM: COLON WITH WATER SOLUTION CONTRAST  COMPARISON:  CT scan dated 06/29/2014 and 07/02/2014  FINDINGS: The scout image demonstrates residual contrast in the colon from the prior CT scan. Sigmoid colon stent is present in the mid pelvis.  I slowly instilled water-soluble contrast in the patient's rectum. The stent is patent. The channel in the mid stent is markedly constricted. There is tumor narrowing the sigmoid portion of the colon just proximal and distal to the stent.  IMPRESSION: The channel in the mid stent is markedly constricted. However, water-soluble contrast does pass through that area. I suspect this narrowing is due to tumor infiltration. There is also tumor encroaching upon the lumen of the colon just proximal and distal to the stent although there is not severe narrowing of the lumen at those sites.   Electronically Signed   By: Lorriane Shire M.D.   On:  07/10/2014 09:21    Medications: I have reviewed the patient's current medications.  Assessment/Plan:  1. Rectal cancer  Obstructing mass at 10 cm from the anal verge noted on a colonoscopy 06/29/2014  CT chest, abdomen and pelvis 06/29/2014 revealed a partially obstructing rectosigmoid mass, widespread peritoneal metastatic disease in the pelvis, and diffuse liver metastases  Biopsy of a liver lesion 07/05/2014 confirmed metastatic adenocarcinoma  2. Status post placement of a rectal stent on 06/30/2014, placed on bowel rest and IV antibiotics secondary to a possible microperforation  3. Crohn's disease  4. Anemia secondary to rectal bleeding and multiple blood draws  5. Anorexia/weight loss  6. Fever-tumor fever?, Microperforation related to the rectal stent?-Improved  7. Status post Port-A-Cath placement 07/04/2014  8. Left pneumothorax following the Port-A-Cath placement, status post chest tube placement 07/05/2014  Recommendations: 1. Continue Management of rectal obstruction per surgery and gastroenterology 2. Outpatient follow-up will be scheduled at the [facility] later this week with the plan to begin chemotherapy early next week.   LOS: 11 days   Karely Hurtado  07/10/2014, 5:04 PM

## 2014-07-10 NOTE — Telephone Encounter (Signed)
Scheduler called to request RN make appointment changes for reschedule of a hospital follow up. Forwarded request to Tiffany in HIM to make change.

## 2014-07-10 NOTE — Progress Notes (Signed)
Patient ID: Sydney Morrison, female   DOB: 09-04-1955, 59 y.o.   MRN: [id number]     [facility]      [address] [place], [place] [address]    Phone: [phone] FAX: [phone]     Subjective: No n/v, abdominal pain. Having loose stools. VSS.  Afebrile.   Objective:  Vital signs:  Filed Vitals:   07/08/14 2226 07/09/14 0519 07/09/14 2141 07/10/14 0520  BP: 110/52 118/48 111/50 119/55  Pulse: 76 75 73 75  Temp: 97.8 F (36.6 C) 98.7 F (37.1 C) 98.7 F (37.1 C) 98.2 F (36.8 C)  TempSrc: Oral Oral Oral   Resp: _0 Height:      Weight:      SpO2: 100% 98% 100% 98%    Last BM Date: 07/08/14  Intake/Output   Yesterday:  04/10 0701 - 04/11 0700 In: 1320 [P.O.:1020; IV Piggyback:300] Out: -  This shift:    I/O last 3 completed shifts: In: 2035 [P.O.:1240; IV Piggyback:300] Out: -     Physical Exam: General: Pt awake/alert/oriented x4 in no acute distress Chest: cta.  No chest wall pain w good excursion CV:  Pulses intact.  Regular rhythmdeformity Abdomen: Soft.  distended.  Non tender. No evidence of peritonitis.  No incarcerated hernias. Ext:  SCDs BLE.  No mjr edema.  No cyanosis Skin: left subclavian port site, steri strips in place, no erythema.    Problem List:   Active Problems:   Colon cancer   Fever   Chronic blood loss anemia   Liver lesion    Results:   Labs: Results for orders placed or performed during the hospital encounter of 06/29/14 (from the past 48 hour(s))  Basic metabolic panel     Status: None   Collection Time: 07/09/14  9:11 AM  Result Value Ref Range   Sodium 137 135 - 145 mmol/L   Potassium 3.9 3.5 - 5.1 mmol/L   Chloride 103 96 - 112 mmol/L   CO2 27 19 - 32 mmol/L   Glucose, Bld 96 70 - 99 mg/dL   BUN 10 6 - 23 mg/dL   Creatinine, Ser 0.58 0.50 - 1.10 mg/dL   Calcium 8.4 8.4 - 10.5 mg/dL   GFR calc non Af Amer >90 >90 mL/min   GFR calc Af Amer >90 >90 mL/min     Comment: (NOTE) The eGFR has been calculated using the CKD EPI equation. This calculation has not been validated in all clinical situations. eGFR's persistently <90 mL/min signify possible Chronic Kidney Disease.    Anion gap 7 5 - 15  CBC     Status: Abnormal   Collection Time: 07/09/14  9:11 AM  Result Value Ref Range   WBC 6.8 4.0 - 10.5 K/uL   RBC 3.20 (L) 3.87 - 5.11 MIL/uL   Hemoglobin 8.8 (L) 12.0 - 15.0 g/dL   HCT 27.4 (L) 36.0 - 46.0 %   MCV 85.6 78.0 - 100.0 fL   MCH 27.5 26.0 - 34.0 pg   MCHC 32.1 30.0 - 36.0 g/dL   RDW 13.7 11.5 - 15.5 %   Platelets 188 150 - 400 K/uL  CBC     Status: Abnormal   Collection Time: 07/10/14  6:08 AM  Result Value Ref Range   WBC 7.4 4.0 - 10.5 K/uL   RBC 3.15 (L) 3.87 - 5.11 MIL/uL   Hemoglobin 8.4 (L) 12.0 - 15.0 g/dL   HCT  27.1 (L) 36.0 - 46.0 %   MCV 86.0 78.0 - 100.0 fL   MCH 26.7 26.0 - 34.0 pg   MCHC 31.0 30.0 - 36.0 g/dL   RDW 13.8 11.5 - 15.5 %   Platelets 192 150 - 400 K/uL  Basic metabolic panel     Status: Abnormal   Collection Time: 07/10/14  6:08 AM  Result Value Ref Range   Sodium 137 135 - 145 mmol/L   Potassium 4.0 3.5 - 5.1 mmol/L   Chloride 100 96 - 112 mmol/L   CO2 27 19 - 32 mmol/L   Glucose, Bld 92 70 - 99 mg/dL   BUN 13 6 - 23 mg/dL   Creatinine, Ser 0.55 0.50 - 1.10 mg/dL   Calcium 8.2 (L) 8.4 - 10.5 mg/dL   GFR calc non Af Amer >90 >90 mL/min   GFR calc Af Amer >90 >90 mL/min    Comment: (NOTE) The eGFR has been calculated using the CKD EPI equation. This calculation has not been validated in all clinical situations. eGFR's persistently <90 mL/min signify possible Chronic Kidney Disease.    Anion gap 10 5 - 15    Imaging / Studies: Dg Chest Port 1 View  07/08/2014   CLINICAL DATA:  Left chest tubes removal  EXAM: PORTABLE CHEST - 1 VIEW  COMPARISON:  07/08/2014  FINDINGS: Cardiomediastinal silhouette is stable. Left subclavian Port-A-Cath is unchanged in position. Left chest tube has been  removed. Persistent small left basilar atelectasis or infiltrate. There is a tiny left apical pneumothorax.  IMPRESSION: Stable left subclavian Port-A-Cath position. Left chest tube has been removed. Persistent small left basilar atelectasis or infiltrate. Tiny left apical pneumothorax.   Electronically Signed   By: Lahoma Crocker M.D.   On: 07/08/2014 10:54    Medications / Allergies:  Scheduled Meds: . clonazePAM  1 mg Oral QHS  . docusate sodium  100 mg Oral BID  . feeding supplement (ENSURE ENLIVE)  237 mL Oral BID BM  . heparin subcutaneous  5,000 Units Subcutaneous 3 times per day  . imipenem-cilastatin  250 mg Intravenous 4 times per day  . Linaclotide  145 mcg Oral Daily  . nicotine  14 mg Transdermal Daily  . sodium phosphate  1 enema Rectal Once  . vitamin B-12  1,000 mcg Oral Daily   Continuous Infusions:  PRN Meds:.acetaminophen, calcium carbonate, HYDROcodone-acetaminophen, promethazine  Antibiotics: Anti-infectives    Start     Dose/Rate Route Frequency Ordered Stop   07/07/14 1200  imipenem-cilastatin (PRIMAXIN) 250 mg in sodium chloride 0.9 % 100 mL IVPB     250 mg 200 mL/hr over 30 Minutes Intravenous 4 times per day 07/07/14 1126     07/01/14 0900  imipenem-cilastatin (PRIMAXIN) 250 mg in sodium chloride 0.9 % 100 mL IVPB  Status:  Discontinued     250 mg 200 mL/hr over 30 Minutes Intravenous 4 times per day 07/01/14 0825 07/05/14 0652   07/01/14 0815  imipenem-cilastatin (PRIMAXIN) 500 mg in sodium chloride 0.9 % 100 mL IVPB  Status:  Discontinued     500 mg 200 mL/hr over 30 Minutes Intravenous 3 times per day 07/01/14 0801 07/01/14 0825   06/30/14 2200  cefTRIAXone (ROCEPHIN) 1 g in dextrose 5 % 50 mL IVPB - Premix  Status:  Discontinued     1 g 100 mL/hr over 30 Minutes Intravenous Every 12 hours 06/30/14 1712 06/30/14 1712   06/30/14 1715  metroNIDAZOLE (FLAGYL) IVPB 500 mg  Status:  Discontinued  500 mg 100 mL/hr over 60 Minutes Intravenous Every 8 hours  06/30/14 1712 06/30/14 1712        Assessment/Plan POD#6 PAC placement---Dr. Lucia Gaskins Post op pneumothorax-CT out on Saturday, follow up CXR shows a small stable apical PTX.  Stable sats.  Much less pain site.  Continue pain control and pulmonary toilet.  May shower.  Apply band aid to CT site. No further treatment.  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

## 2014-07-11 ENCOUNTER — Other Ambulatory Visit: Payer: 59

## 2014-07-11 ENCOUNTER — Telehealth: Payer: Self-pay | Admitting: *Deleted

## 2014-07-11 ENCOUNTER — Other Ambulatory Visit: Payer: Self-pay | Admitting: *Deleted

## 2014-07-11 ENCOUNTER — Telehealth: Payer: Self-pay | Admitting: Nurse Practitioner

## 2014-07-11 LAB — CBC
HEMATOCRIT: 27.2 % — AB (ref 36.0–46.0)
Hemoglobin: 8.5 g/dL — ABNORMAL LOW (ref 12.0–15.0)
MCH: 26.8 pg (ref 26.0–34.0)
MCHC: 31.3 g/dL (ref 30.0–36.0)
MCV: 85.8 fL (ref 78.0–100.0)
PLATELETS: 217 10*3/uL (ref 150–400)
RBC: 3.17 MIL/uL — ABNORMAL LOW (ref 3.87–5.11)
RDW: 13.7 % (ref 11.5–15.5)
WBC: 8.6 10*3/uL (ref 4.0–10.5)

## 2014-07-11 LAB — BASIC METABOLIC PANEL
ANION GAP: 8 (ref 5–15)
BUN: 14 mg/dL (ref 6–23)
CALCIUM: 8.5 mg/dL (ref 8.4–10.5)
CO2: 30 mmol/L (ref 19–32)
CREATININE: 0.62 mg/dL (ref 0.50–1.10)
Chloride: 98 mmol/L (ref 96–112)
GFR calc Af Amer: >90 mL/min (ref >90)
GFR calc non Af Amer: >90 mL/min (ref >90)
Glucose, Bld: 104 mg/dL — ABNORMAL HIGH (ref 70–99)
POTASSIUM: 4.1 mmol/L (ref 3.5–5.1)
Sodium: 136 mmol/L (ref 135–145)

## 2014-07-11 NOTE — Telephone Encounter (Signed)
Per staff message and POF I have scheduled appts. Advised scheduler of appts. JMW  

## 2014-07-11 NOTE — Telephone Encounter (Signed)
Spoke with patients husband and he is aware of her chemo ed appointment

## 2014-07-11 NOTE — Telephone Encounter (Signed)
No additional note

## 2014-07-11 NOTE — Progress Notes (Signed)
Discharge instructions reviewed with pt.  Pt verbalized understanding and questions answered.  [facility] to verify pt's appointments because pt stated Dr. Benay Spice told her both appointments would be on Friday 4/15.  [facility] stated they would call pt with new appointment for chemo education.  Pt made aware.  Pt discharged in stable condition via wheelchair with husband.  Eliezer Bottom Lula

## 2014-07-11 NOTE — Discharge Summary (Addendum)
Physician Discharge Summary  Patient ID: Sydney Morrison MRN: [id number] DOB/AGE: [birth year] 59 y.o.  Admit date: 06/29/2014 Discharge date: 07/11/2014  Admission Diagnoses: Obstructive Rectosigmoid colon cancer  Discharge Diagnoses: 1) Metastatic rectosigmoid colon cancer.        2) S/p left pneumothorax.        3) Constipation.        4) Tumor fever. Active Problems:   Colon cancer   Fever   Chronic blood loss anemia   Liver lesion   Discharged Condition: good  Hospital Course: The patient was hospitalized emergently from the office endoscopy unit.  She was identified to have an obstructive rectosigmoid colon cancer.  Upon admission a CT scan was obtained and it revealed widespread metastases in the abdomen and liver.  A surgical consultation was obtained for a diverting colostomy, but GI was request to attempt a stent placement.  The following day a 2 cm x 9 cm uncovered rectosigmoid stent was placed across the lesion.  During the procedure there was concern that the cancer may have eroded through the colon and Surgery was notified.  A KUB post procedure was negative for any free air.  Overnight on hospital day #2 she spiked a fever and a repeat CT scan was obtained.  Primaxin was also initiated and the CT scan was negative for any perforation.  She was followed closely, but clinically she never complained of any abdominal pain.  Despite the stent placement she did not have a significant improvement in her bowel movements.  She was started on Miralax and colace, but it was only efficacious for one day.  Subsequently it caused her to have bloating and the medication was discontinued.  Later on she refused taking colace as she reported that it was never efficacious for her in the past.  Oncology was consulted and a liver biopsy was requested for the metastatic lesions, which were confirmed to be metastatic adenocarcinoma.  The following day she also underwent a left Port-A-Cath placement.  Post  procedure she complained of some SOB, but the immediate post procedure CXR was negative for pneumothorax.  The next morning she continued to have issues with SOB and her pulse oxygenation was in the 80's, which mildly increased with nasal cannula.  A CXR was obtained as there were decreased breath sounds in the left lung field.  The CXR revealed a large left pneumothorax and Dr. Lucia Gaskins performed a bedside chest tube placement.  Subsequent CXR also revealed moderately sized bilateral pleural effusions, which responded to two doses of lasix.  She had the chest tube for 3 day, but she remained on nasal cannula.  Ultimately she was able to be weaned off of oxygen and she was maintaining her saturations, however, she still felt SOB.  Her pulse ox remained above 94% and she appeared to be comfortable.  The original plans to interrogate the stent placement were pursued and a single contrast BE revealed patency of the stent, but there was tumor ingrowth of the stent.  Liquid was able to pass through the stenosis.  Despite the stenosis, the abdominal x-rays and CT scan continued to reveal a persistent retention of the oral contrast in the right colon, suggesting a component of dysmotility.  Her abdomen did decrease in distension with linaclotide, but it was only effective for one dosing.  After a long discussion with the patient and her husband, the plan was to discharge her home and to start on chemotherapy ASAP.  The hope is that  the chemotherapy will help to open up the stent by reducing the tumor ingrowth.  An overlapping stent and/or fulguration of the tumor ingrowth could always be performed if there was insufficient response to the chemotherapy.    She is to follow up with Dr. Benay Spice 07/13/2014 for orientation and initiation of chemotherapy. I advised the patient to stay on a low residue diet and to ambulate to help with colonic motility.  Consults: general surgery and oncology, IR  Significant Diagnostic  Studies: see the hospital course dictation  Treatments: IV hydration, antibiotics: primaxin and anticoagulation: none  Discharge Exam: Blood pressure 126/60, pulse 74, temperature 99.9 F (37.7 C), temperature source Oral, resp. rate 17, height 5\' 7"  (1.702 m), weight 54.432 kg (120 lb), SpO2 94 %. General appearance: alert and no distress Resp: clear to auscultation bilaterally GI: soft, nontender, mildly/moderately distended, + BS Extremities: extremities normal, atraumatic, no cyanosis or edema  Disposition: 01-Home or Self Care      Medication List    ASK your doctor about these medications        acetaminophen 500 MG tablet  Commonly known as:  TYLENOL  Take 500 mg by mouth every 6 (six) hours as needed for mild pain.     cromolyn 5.2 MG/ACT nasal spray  Commonly known as:  NASALCROM  Place 1 spray into both nostrils at bedtime.     Cyanocobalamin 1500 MCG Tbdp  Take 1 tablet by mouth daily.     loperamide 2 MG capsule  Commonly known as:  IMODIUM  Take 2 mg by mouth as needed for diarrhea or loose stools.     Magnesium 250 MG Tabs  Take 1 tablet by mouth daily.     meclizine 25 MG tablet  Commonly known as:  ANTIVERT  Take 1 tablet (25 mg total) by mouth 3 (three) times daily as needed for dizziness.     ondansetron 4 MG disintegrating tablet  Commonly known as:  ZOFRAN ODT  Take 1 tablet (4 mg total) by mouth every 8 (eight) hours as needed for nausea or vomiting.           Follow-up Information    Follow up with Adin Hector, MD. Schedule an appointment as soon as possible for a visit in 1 week.   Specialty:  General Surgery   Why:  For post-hospital follow up, regarding port-a-cath placement   Contact information:   [address] [place] [address] [phone]       Signed: Beryle Beams 07/11/2014, 7:29 AM

## 2014-07-11 NOTE — Progress Notes (Signed)
Patient ID: Sydney Morrison, female   DOB: 10/12/1955, 59 y.o.   MRN: [id number]     [facility]      [address] [place], [place] [address]    Phone: [phone] FAX: [phone]     Subjective: No issues.   Objective:  Vital signs:  Filed Vitals:   07/10/14 0520 07/10/14 1415 07/10/14 2116 07/11/14 0513  BP: 119/55 112/52 122/60 126/60  Pulse: 75 76 79 74  Temp: 98.2 F (36.8 C) 98.1 F (36.7 C) 98.7 F (37.1 C) 99.9 F (37.7 C)  TempSrc:  Oral Oral Oral  Resp: 16 16 17 17   Height:      Weight:      SpO2: 98% 98% 97% 94%    Last BM Date: 07/08/14  Intake/Output   Yesterday:  04/11 0701 - 04/12 0700 In: 340 [P.O.:340] Out: -  This shift: I/O last 3 completed shifts: In: 44 [P.O.:580; IV Piggyback:200] Out: -      Physical Exam: General: Pt awake/alert/oriented x4 in no acute distress Chest: cta. No chest wall pain w good excursion CV: Pulses intact. Regular rhythm  Abdomen: Soft. distended. Non tender. No evidence of peritonitis. No incarcerated hernias. Ext: SCDs BLE. No mjr edema. No cyanosis Skin: left subclavian port site, steri strips in place, no erythema.    Problem List:   Active Problems:   Colon cancer   Fever   Chronic blood loss anemia   Liver lesion    Results:   Labs: Results for orders placed or performed during the hospital encounter of 06/29/14 (from the past 48 hour(s))  Basic metabolic panel     Status: None   Collection Time: 07/09/14  9:11 AM  Result Value Ref Range   Sodium 137 135 - 145 mmol/L   Potassium 3.9 3.5 - 5.1 mmol/L   Chloride 103 96 - 112 mmol/L   CO2 27 19 - 32 mmol/L   Glucose, Bld 96 70 - 99 mg/dL   BUN 10 6 - 23 mg/dL   Creatinine, Ser 0.58 0.50 - 1.10 mg/dL   Calcium 8.4 8.4 - 10.5 mg/dL   GFR calc non Af Amer >90 >90 mL/min   GFR calc Af Amer >90 >90 mL/min    Comment: (NOTE) The eGFR has been calculated using the CKD EPI equation. This  calculation has not been validated in all clinical situations. eGFR's persistently <90 mL/min signify possible Chronic Kidney Disease.    Anion gap 7 5 - 15  CBC     Status: Abnormal   Collection Time: 07/09/14  9:11 AM  Result Value Ref Range   WBC 6.8 4.0 - 10.5 K/uL   RBC 3.20 (L) 3.87 - 5.11 MIL/uL   Hemoglobin 8.8 (L) 12.0 - 15.0 g/dL   HCT 27.4 (L) 36.0 - 46.0 %   MCV 85.6 78.0 - 100.0 fL   MCH 27.5 26.0 - 34.0 pg   MCHC 32.1 30.0 - 36.0 g/dL   RDW 13.7 11.5 - 15.5 %   Platelets 188 150 - 400 K/uL  CBC     Status: Abnormal   Collection Time: 07/10/14  6:08 AM  Result Value Ref Range   WBC 7.4 4.0 - 10.5 K/uL   RBC 3.15 (L) 3.87 - 5.11 MIL/uL   Hemoglobin 8.4 (L) 12.0 - 15.0 g/dL   HCT 27.1 (L) 36.0 - 46.0 %   MCV 86.0 78.0 - 100.0 fL   MCH 26.7 26.0 -  34.0 pg   MCHC 31.0 30.0 - 36.0 g/dL   RDW 13.8 11.5 - 15.5 %   Platelets 192 150 - 400 K/uL  Basic metabolic panel     Status: Abnormal   Collection Time: 07/10/14  6:08 AM  Result Value Ref Range   Sodium 137 135 - 145 mmol/L   Potassium 4.0 3.5 - 5.1 mmol/L   Chloride 100 96 - 112 mmol/L   CO2 27 19 - 32 mmol/L   Glucose, Bld 92 70 - 99 mg/dL   BUN 13 6 - 23 mg/dL   Creatinine, Ser 0.55 0.50 - 1.10 mg/dL   Calcium 8.2 (L) 8.4 - 10.5 mg/dL   GFR calc non Af Amer >90 >90 mL/min   GFR calc Af Amer >90 >90 mL/min    Comment: (NOTE) The eGFR has been calculated using the CKD EPI equation. This calculation has not been validated in all clinical situations. eGFR's persistently <90 mL/min signify possible Chronic Kidney Disease.    Anion gap 10 5 - 15  Basic metabolic panel     Status: Abnormal   Collection Time: 07/11/14  4:45 AM  Result Value Ref Range   Sodium 136 135 - 145 mmol/L   Potassium 4.1 3.5 - 5.1 mmol/L   Chloride 98 96 - 112 mmol/L   CO2 30 19 - 32 mmol/L   Glucose, Bld 104 (H) 70 - 99 mg/dL   BUN 14 6 - 23 mg/dL   Creatinine, Ser 0.62 0.50 - 1.10 mg/dL   Calcium 8.5 8.4 - 10.5 mg/dL   GFR calc  non Af Amer >90 >90 mL/min   GFR calc Af Amer >90 >90 mL/min    Comment: (NOTE) The eGFR has been calculated using the CKD EPI equation. This calculation has not been validated in all clinical situations. eGFR's persistently <90 mL/min signify possible Chronic Kidney Disease.    Anion gap 8 5 - 15  CBC     Status: Abnormal   Collection Time: 07/11/14  4:45 AM  Result Value Ref Range   WBC 8.6 4.0 - 10.5 K/uL   RBC 3.17 (L) 3.87 - 5.11 MIL/uL   Hemoglobin 8.5 (L) 12.0 - 15.0 g/dL   HCT 27.2 (L) 36.0 - 46.0 %   MCV 85.8 78.0 - 100.0 fL   MCH 26.8 26.0 - 34.0 pg   MCHC 31.3 30.0 - 36.0 g/dL   RDW 13.7 11.5 - 15.5 %   Platelets 217 150 - 400 K/uL    Imaging / Studies: Dg Colon W/water Sol Cm  07/10/2014   CLINICAL DATA:  Carcinoma colon.  Stent placed in the sigmoid colon.  EXAM: COLON WITH WATER SOLUTION CONTRAST  COMPARISON:  CT scan dated 06/29/2014 and 07/02/2014  FINDINGS: The scout image demonstrates residual contrast in the colon from the prior CT scan. Sigmoid colon stent is present in the mid pelvis.  I slowly instilled water-soluble contrast in the patient's rectum. The stent is patent. The channel in the mid stent is markedly constricted. There is tumor narrowing the sigmoid portion of the colon just proximal and distal to the stent.  IMPRESSION: The channel in the mid stent is markedly constricted. However, water-soluble contrast does pass through that area. I suspect this narrowing is due to tumor infiltration. There is also tumor encroaching upon the lumen of the colon just proximal and distal to the stent although there is not severe narrowing of the lumen at those sites.   Electronically Signed   By:  Lorriane Shire M.D.   On: 07/10/2014 09:21    Medications / Allergies:  Scheduled Meds: . clonazePAM  1 mg Oral QHS  . docusate sodium  100 mg Oral BID  . feeding supplement (ENSURE ENLIVE)  237 mL Oral BID BM  . heparin subcutaneous  5,000 Units Subcutaneous 3 times per day   . Linaclotide  145 mcg Oral Daily  . nicotine  14 mg Transdermal Daily  . sodium phosphate  1 enema Rectal Once  . vitamin B-12  1,000 mcg Oral Daily   Continuous Infusions:  PRN Meds:.acetaminophen, calcium carbonate, HYDROcodone-acetaminophen, promethazine  Antibiotics: Anti-infectives    Start     Dose/Rate Route Frequency Ordered Stop   07/07/14 1200  imipenem-cilastatin (PRIMAXIN) 250 mg in sodium chloride 0.9 % 100 mL IVPB  Status:  Discontinued     250 mg 200 mL/hr over 30 Minutes Intravenous 4 times per day 07/07/14 1126 07/10/14 1335   07/01/14 0900  imipenem-cilastatin (PRIMAXIN) 250 mg in sodium chloride 0.9 % 100 mL IVPB  Status:  Discontinued     250 mg 200 mL/hr over 30 Minutes Intravenous 4 times per day 07/01/14 0825 07/05/14 0652   07/01/14 0815  imipenem-cilastatin (PRIMAXIN) 500 mg in sodium chloride 0.9 % 100 mL IVPB  Status:  Discontinued     500 mg 200 mL/hr over 30 Minutes Intravenous 3 times per day 07/01/14 0801 07/01/14 0825   06/30/14 2200  cefTRIAXone (ROCEPHIN) 1 g in dextrose 5 % 50 mL IVPB - Premix  Status:  Discontinued     1 g 100 mL/hr over 30 Minutes Intravenous Every 12 hours 06/30/14 1712 06/30/14 1712   06/30/14 1715  metroNIDAZOLE (FLAGYL) IVPB 500 mg  Status:  Discontinued     500 mg 100 mL/hr over 60 Minutes Intravenous Every 8 hours 06/30/14 1712 06/30/14 1712        Assessment/Plan POD#7 PAC placement---Dr. Lucia Gaskins Post op pneumothorax-resolved.  Stable for discharge.  2 week post op check.  Discharge instructions provided.  Status post stent for obstructing rectosigmoid cancer with liver metastases -FL diet, with supplement.  OP oncology follow up scheduled  Erby Pian, [facility] Pager [phone](7A-4:30P) For consults and floor pages call [phone](7A-4:30P)  07/11/2014 8:14 AM

## 2014-07-14 ENCOUNTER — Encounter: Payer: Self-pay | Admitting: Nurse Practitioner

## 2014-07-14 ENCOUNTER — Ambulatory Visit (HOSPITAL_BASED_OUTPATIENT_CLINIC_OR_DEPARTMENT_OTHER): Payer: 59 | Admitting: Nurse Practitioner

## 2014-07-14 ENCOUNTER — Telehealth: Payer: Self-pay | Admitting: Nurse Practitioner

## 2014-07-14 ENCOUNTER — Other Ambulatory Visit: Payer: 59

## 2014-07-14 VITALS — BP 127/64 | HR 97 | Temp 97.6°F | Resp 19 | Ht 67.0 in | Wt 118.9 lb

## 2014-07-14 DIAGNOSIS — C189 Malignant neoplasm of colon, unspecified: Secondary | ICD-10-CM

## 2014-07-14 DIAGNOSIS — C787 Secondary malignant neoplasm of liver and intrahepatic bile duct: Secondary | ICD-10-CM

## 2014-07-14 DIAGNOSIS — D500 Iron deficiency anemia secondary to blood loss (chronic): Secondary | ICD-10-CM | POA: Diagnosis not present

## 2014-07-14 DIAGNOSIS — C20 Malignant neoplasm of rectum: Secondary | ICD-10-CM | POA: Diagnosis not present

## 2014-07-14 DIAGNOSIS — C786 Secondary malignant neoplasm of retroperitoneum and peritoneum: Secondary | ICD-10-CM

## 2014-07-14 DIAGNOSIS — R509 Fever, unspecified: Secondary | ICD-10-CM

## 2014-07-14 MED ORDER — LIDOCAINE-PRILOCAINE 2.5-2.5 % EX CREA: 1.0000 "application " | TOPICAL_CREAM | CUTANEOUS | Status: DC | PRN

## 2014-07-14 MED ORDER — ZOLPIDEM TARTRATE 5 MG PO TABS: 5.0000 mg | ORAL_TABLET | Freq: Every evening | ORAL | Status: DC | PRN

## 2014-07-14 NOTE — Telephone Encounter (Signed)
Appointments made and avs printed for patient °

## 2014-07-14 NOTE — Progress Notes (Signed)
Pt brought in critical claim form- placed in Raquel Brownings box for review.

## 2014-07-14 NOTE — Progress Notes (Addendum)
  Sydney Morrison OFFICE PROGRESS NOTE   Diagnosis:  Rectal cancer  INTERVAL HISTORY:   Sydney Morrison returns as scheduled. She attended the chemotherapy education class this morning. She reports improved dyspnea on exertion. She has pain at the previous chest tube site. She continues to have a daily fever in the late afternoon hours. She is taking Tylenol around 4:00 every afternoon to try to prevent the fever. Appetite is poor. She is drinking 4 ensure a day. Since yesterday she has noted pain at the left lower anterior chest with deep breathing and coughing. As noted above she notes improvement in the dyspnea on exertion. She is having diarrhea.  Objective:  Vital signs in last 24 hours:  Blood pressure 127/64, pulse 97, temperature 97.6 F (36.4 C), temperature source Oral, resp. rate 19, height 5\' 7"  (1.702 m), weight 118 lb 14.4 oz (53.933 kg), SpO2 97 %.    HEENT: No thrush or ulcers. Resp: Lungs clear bilaterally. Good air movement. Cardio: Regular rate and rhythm. GI: Firm fullness right upper abdomen with associated tenderness. Vascular: No leg edema. Port-A-Cath without erythema.   Lab Results:  Lab Results  Component Value Date   WBC 8.6 07/11/2014   HGB 8.5* 07/11/2014   HCT 27.2* 07/11/2014   MCV 85.8 07/11/2014   PLT 217 07/11/2014   NEUTROABS 7.4 05/31/2014    Imaging:  No results found.  Medications: I have reviewed the patient's current medications.  Assessment/Plan: 1. Rectal cancer  Obstructing mass at 10 cm from the anal verge noted on a colonoscopy 06/29/2014  CT chest, abdomen and pelvis 06/29/2014 revealed a partially obstructing rectosigmoid mass, widespread peritoneal metastatic disease in the pelvis, and diffuse liver metastases  Biopsy of a liver lesion 07/05/2014 confirmed metastatic adenocarcinoma  2. Status post placement of a rectal stent on 06/30/2014, placed on bowel rest and IV antibiotics secondary to a possible  microperforation  3. Crohn's disease  4. Anemia secondary to rectal bleeding and multiple blood draws  5. Anorexia/weight loss  6. Fever-tumor fever?, Microperforation related to the rectal stent?-Improved  7. Status post Port-A-Cath placement 07/04/2014  8. Left pneumothorax following the Port-A-Cath placement, status post chest tube placement 07/05/2014. Chest tube removed 07/08/2014.   Disposition: Sydney Morrison appears stable. The plan is to proceed with cycle 1 FOLFOX on 07/19/2014. She has attended the chemotherapy education class. We again reviewed potential toxicities associated with this regimen. We discussed the potential for myelosuppression, nausea, allergic reaction. We reviewed potential toxicities associated with 5-fluorouracil including mouth sores, diarrhea, hand-foot syndrome, skin hyperpigmentation, conjunctivitis-like symptoms. We reviewed potential toxicities associated with oxaliplatin including cold sensitivity, peripheral neuropathy, acute laryngopharyngeal dysesthesia, jaw pain, diplopia, ataxia, incontinence. She is agreeable to proceed.  She will return for cycle 1 FOLFOX on 07/19/2014. We will see her prior to cycle 2 FOLFOX on 08/02/2014. She will contact the office in the interim with any problems. We specifically discussed any worsening shortness of breath.  We discussed a referral to the [place] dietitian. She declines this at present.  Patient seen with Dr. Benay Spice. 25 minutes were spent face-to-face at today's visit with the majority of that time involved in counseling/coordination of care.  Ned Card ANP/GNP-BC   07/14/2014  12:41 PM  This was a shared visit with Ned Card. Ms. Dicker will begin FOLFOX chemotherapy 07/19/2014. We reviewed the potential toxicities associated with this regimen and she agrees to proceed.  Julieanne Manson, M.D.

## 2014-07-16 ENCOUNTER — Other Ambulatory Visit: Payer: Self-pay | Admitting: Oncology

## 2014-07-18 ENCOUNTER — Encounter: Payer: Self-pay | Admitting: Oncology

## 2014-07-18 NOTE — Progress Notes (Signed)
I placed critical illness forms on the desk of nurse for dr. Benay Spice

## 2014-07-19 ENCOUNTER — Ambulatory Visit (HOSPITAL_BASED_OUTPATIENT_CLINIC_OR_DEPARTMENT_OTHER): Payer: 59

## 2014-07-19 ENCOUNTER — Other Ambulatory Visit (HOSPITAL_BASED_OUTPATIENT_CLINIC_OR_DEPARTMENT_OTHER): Payer: 59

## 2014-07-19 ENCOUNTER — Encounter: Payer: Self-pay | Admitting: *Deleted

## 2014-07-19 ENCOUNTER — Telehealth: Payer: Self-pay | Admitting: *Deleted

## 2014-07-19 VITALS — BP 133/54 | HR 93 | Temp 98.9°F | Resp 18

## 2014-07-19 DIAGNOSIS — C20 Malignant neoplasm of rectum: Secondary | ICD-10-CM

## 2014-07-19 DIAGNOSIS — C787 Secondary malignant neoplasm of liver and intrahepatic bile duct: Secondary | ICD-10-CM

## 2014-07-19 DIAGNOSIS — C189 Malignant neoplasm of colon, unspecified: Secondary | ICD-10-CM

## 2014-07-19 DIAGNOSIS — C786 Secondary malignant neoplasm of retroperitoneum and peritoneum: Secondary | ICD-10-CM

## 2014-07-19 DIAGNOSIS — Z5111 Encounter for antineoplastic chemotherapy: Secondary | ICD-10-CM

## 2014-07-19 LAB — CBC WITH DIFFERENTIAL/PLATELET
BASO%: 0.7 % (ref 0.0–2.0)
Basophils Absolute: 0.1 10*3/uL (ref 0.0–0.1)
EOS%: 1.7 % (ref 0.0–7.0)
Eosinophils Absolute: 0.2 10*3/uL (ref 0.0–0.5)
HCT: 29.7 % — ABNORMAL LOW (ref 34.8–46.6)
HGB: 9.5 g/dL — ABNORMAL LOW (ref 11.6–15.9)
LYMPH#: 1.6 10*3/uL (ref 0.9–3.3)
LYMPH%: 12.1 % — AB (ref 14.0–49.7)
MCH: 27 pg (ref 25.1–34.0)
MCHC: 32 g/dL (ref 31.5–36.0)
MCV: 84.4 fL (ref 79.5–101.0)
MONO#: 1.4 10*3/uL — AB (ref 0.1–0.9)
MONO%: 10.2 % (ref 0.0–14.0)
NEUT#: 10.1 10*3/uL — ABNORMAL HIGH (ref 1.5–6.5)
NEUT%: 75.3 % (ref 38.4–76.8)
NRBC: 0 % (ref 0–0)
Platelets: 293 10*3/uL (ref 145–400)
RBC: 3.52 10*6/uL — AB (ref 3.70–5.45)
RDW: 14 % (ref 11.2–14.5)
WBC: 13.4 10*3/uL — AB (ref 3.9–10.3)

## 2014-07-19 MED ORDER — SODIUM CHLORIDE 0.9 % IV SOLN
2400.0000 mg/m2 | INTRAVENOUS | Status: DC
  Administered 2014-07-19: 3850 mg via INTRAVENOUS
  Filled 2014-07-19: qty 77

## 2014-07-19 MED ORDER — DEXTROSE 5 % IV SOLN
Freq: Once | INTRAVENOUS | Status: AC
  Administered 2014-07-19: 13:00:00 via INTRAVENOUS

## 2014-07-19 MED ORDER — SODIUM CHLORIDE 0.9 % IV SOLN
Freq: Once | INTRAVENOUS | Status: AC
  Administered 2014-07-19: 13:00:00 via INTRAVENOUS
  Filled 2014-07-19: qty 4

## 2014-07-19 MED ORDER — LEUCOVORIN CALCIUM INJECTION 350 MG
400.0000 mg/m2 | Freq: Once | INTRAVENOUS | Status: AC
  Administered 2014-07-19: 640 mg via INTRAVENOUS
  Filled 2014-07-19: qty 32

## 2014-07-19 MED ORDER — FLUOROURACIL CHEMO INJECTION 2.5 GM/50ML
400.0000 mg/m2 | Freq: Once | INTRAVENOUS | Status: AC
Start: 2014-07-19 — End: 2014-07-19
  Administered 2014-07-19: 650 mg via INTRAVENOUS
  Filled 2014-07-19: qty 13

## 2014-07-19 MED ORDER — OXALIPLATIN CHEMO INJECTION 100 MG/20ML
85.0000 mg/m2 | Freq: Once | INTRAVENOUS | Status: AC
  Administered 2014-07-19: 135 mg via INTRAVENOUS
  Filled 2014-07-19: qty 27

## 2014-07-19 NOTE — Patient Instructions (Signed)
[facility] Discharge Instructions for Patients Receiving Chemotherapy  Today you received the following chemotherapy agents: Oxaliplatin, Leucovorin, and Adrucil   To help prevent nausea and vomiting after your treatment, we encourage you to take your nausea medication as directed.    If you develop nausea and vomiting that is not controlled by your nausea medication, call the clinic.   BELOW ARE SYMPTOMS THAT SHOULD BE REPORTED IMMEDIATELY:  *FEVER GREATER THAN 100.5 F  *CHILLS WITH OR WITHOUT FEVER  NAUSEA AND VOMITING THAT IS NOT CONTROLLED WITH YOUR NAUSEA MEDICATION  *UNUSUAL SHORTNESS OF BREATH  *UNUSUAL BRUISING OR BLEEDING  TENDERNESS IN MOUTH AND THROAT WITH OR WITHOUT PRESENCE OF ULCERS  *URINARY PROBLEMS  *BOWEL PROBLEMS  UNUSUAL RASH Items with * indicate a potential emergency and should be followed up as soon as possible.  Feel free to call the clinic you have any questions or concerns. The clinic phone number is [phone].  Please show the CHEMO ALERT CARD at check-in to the Emergency Department and triage nurse.   

## 2014-07-19 NOTE — Telephone Encounter (Signed)
TC from pt's husband, inquiring about insurance papers. He is in the lobby while Sydney Morrison is in the treatment room. He would like to pick up the papers today since he is here. Message given to Dr. Gearldine Shown nurse, Lavella Lemons.

## 2014-07-19 NOTE — Progress Notes (Signed)
Per Lavella Lemons, RN,  Okay to proceed with treatment today per Ned Card, NP despite patient having continued diarrhea and rectal bleeding, and chills/sweating in the afternoons.

## 2014-07-19 NOTE — [facility] Oncology Navigator Note (Signed)
Met with patient and husband during initial chemotherapy treatment to provide support and assess for needs to promote continuity of care. Discussed 1. How to take her antiemetic Zofran 2. Management of diarrhea and importance of hydration 3. Provided with information regarding GI Support group and role of GI Navigator. Provided contact information.  Merceda Elks, RN, BSN GI Oncology [place]

## 2014-07-20 ENCOUNTER — Telehealth: Payer: Self-pay | Admitting: *Deleted

## 2014-07-20 MED ORDER — PROCHLORPERAZINE MALEATE 10 MG PO TABS: 10.0000 mg | ORAL_TABLET | Freq: Four times a day (QID) | ORAL | Status: DC | PRN

## 2014-07-20 NOTE — Telephone Encounter (Signed)
Pt returned call to nurse.  Per pt, she is doing fine.  Fair appetite, and drinking lots of fluids as tolerated.  Denied nausea/vomiting, denied pain, bowel and bladder function fine.  Pt stated she took Zofran 4mg  this am as instructed by the infusion nurse as a preventative for nausea; however, within a few minutes, pt experienced shaking, and was feeling very nauseated but no vomiting.  Pt stated she is still recovering from the shaking experience.  Informed pt that message will be sent to Dr. Benay Spice, and Lavella Lemons, desk nurse.  Informed pt that a new antiemetic prescription will be called in to pt's pharmacy. Reinforced several times with pt that if she has to take the new antiemeitic med ( Compazine ) , pt does not need to drive due to medication can cause drowsiness.   Pt voiced understanding. Nurse noted that pt did received Zofran as part of premed yesterday prior to chemo and had no reaction noted.

## 2014-07-21 ENCOUNTER — Ambulatory Visit (HOSPITAL_BASED_OUTPATIENT_CLINIC_OR_DEPARTMENT_OTHER): Payer: 59

## 2014-07-21 VITALS — BP 139/63 | HR 86 | Temp 98.7°F

## 2014-07-21 DIAGNOSIS — C189 Malignant neoplasm of colon, unspecified: Secondary | ICD-10-CM

## 2014-07-21 DIAGNOSIS — Z452 Encounter for adjustment and management of vascular access device: Secondary | ICD-10-CM | POA: Diagnosis not present

## 2014-07-21 DIAGNOSIS — C20 Malignant neoplasm of rectum: Secondary | ICD-10-CM

## 2014-07-21 MED ORDER — HEPARIN SOD (PORK) LOCK FLUSH 100 UNIT/ML IV SOLN
500.0000 [IU] | Freq: Once | INTRAVENOUS | Status: AC | PRN
  Administered 2014-07-21: 500 [IU]
  Filled 2014-07-21: qty 5

## 2014-07-21 MED ORDER — LORAZEPAM 0.5 MG PO TABS: 0.5000 mg | ORAL_TABLET | Freq: Four times a day (QID) | ORAL | Status: DC | PRN

## 2014-07-21 MED ORDER — SODIUM CHLORIDE 0.9 % IJ SOLN
10.0000 mL | INTRAMUSCULAR | Status: DC | PRN
  Administered 2014-07-21: 10 mL
  Filled 2014-07-21: qty 10

## 2014-07-21 NOTE — Progress Notes (Signed)
Pt in for pump DC states that when she took her zofran that it made her nauseous.  Pt was then prescribed compazine but she noticed that the side effects were anxiety.  States that she would prefer something else.  Pt also requested to have her chest tube site evaluated by a staff member here at the cancer center.  Dr Benay Spice and Rosalio Macadamia, Rn made aware of pt concerns.  Dr Benay Spice assessed pt chest tube site.

## 2014-07-21 NOTE — Progress Notes (Signed)
Spoke with pt while in the office for pump disconnect. She is hesitant to take Compazine due to her anxiety issues. Afraid this will make it worse. Reviewed with Dr. Benay Spice: Order received for Ativan 0.5 mg Q 6 hours PRN nausea.  Pt has Rx for Xanax. Reports she had been using this for sleep. Has not taken in over a week. Instructed her to HOLD Xanax. Begin Ativan PRN nausea. She and husband voiced understanding. Pt also reports she was scheduled to see Dr. Lucia Gaskins for follow up and had to reschedule. Has band-aid dressing over former chest tube site. Requests to have this evaluated.

## 2014-07-21 NOTE — Patient Instructions (Signed)
Fluorouracil, 5FU; Diclofenac topical cream What is this medicine? FLUOROURACIL; DICLOFENAC (flure oh YOOR a sil; dye KLOE fen ak) is a combination of a topical chemotherapy agent and non-steroidal anti-inflammatory drug (NSAID). It is used on the skin to treat skin cancer and skin conditions that could become cancer. This medicine may be used for other purposes; ask your health care provider or pharmacist if you have questions. COMMON BRAND NAME(S): FLUORAC What should I tell my health care provider before I take this medicine? They need to know if you have any of these conditions: -bleeding problems -cigarette smoker -DPD enzyme deficiency -heart disease -high blood pressure -if you frequently drink alcohol containing drinks -kidney disease -liver disease -open or infected skin -stomach problems -swelling or open sores at the treatment site -recent or planned coronary artery bypass graft (CABG) surgery -an unusual or allergic reaction to fluorouracil, diclofenac, aspirin, other NSAIDs, other medicines, foods, dyes, or preservatives -pregnant or trying to get pregnant -breast-feeding How should I use this medicine? This medicine is only for use on the skin. Follow the directions on the prescription label. Wash hands before and after use. Wash affected area and gently pat dry. To apply this medicine use a cotton-tipped applicator, or use gloves if applying with fingertips. If applied with unprotected fingertips, it is very important to wash your hands well after you apply this medicine. Avoid applying to the eyes, nose, or mouth. Apply enough medicine to cover the affected area. You can cover the area with a light gauze dressing, but do not use tight or air-tight dressings. Finish the full course prescribed by your doctor or health care professional, even if you think your condition is better. Do not stop taking except on the advice of your doctor or health care professional. Talk to your  pediatrician regarding the use of this medicine in children. Special care may be needed. Overdosage: If you think you've taken too much of this medicine contact a poison control center or emergency room at once. Overdosage: If you think you have taken too much of this medicine contact a poison control center or emergency room at once. NOTE: This medicine is only for you. Do not share this medicine with others. What if I miss a dose? If you miss a dose, apply it as soon as you can. If it is almost time for your next dose, only use that dose. Do not apply extra doses. Contact your doctor or health care professional if you miss more than one dose. What may interact with this medicine? Interactions are not expected. Do not use any other skin products without telling your doctor or health care professional. This list may not describe all possible interactions. Give your health care provider a list of all the medicines, herbs, non-prescription drugs, or dietary supplements you use. Also tell them if you smoke, drink alcohol, or use illegal drugs. Some items may interact with your medicine. What should I watch for while using this medicine? Visit your doctor or health care professional for checks on your progress. You will need to use this medicine for 2 to 6 weeks. This may be longer depending on the condition being treated. You may not see full healing for another 1 to 2 months after you stop using the medicine. Treated areas of skin can look unsightly during and for several weeks after treatment with this medicine. This medicine can make you more sensitive to the sun. Keep out of the sun. If you cannot avoid being in   the sun, wear protective clothing and use sunscreen. Do not use sun lamps or tanning beds/booths. Where should I keep my What side effects may I notice from receiving this medicine? Side effects that you should report to your doctor or health care professional as soon as possible: -allergic  reactions like skin rash, itching or hives, swelling of the face, lips, or tongue -black or bloody stools, blood in the urine or vomit -blurred vision -chest pain -difficulty breathing or wheezing -redness, blistering, peeling or loosening of the skin, including inside the mouth -severe redness and swelling of normal skin -slurred speech or weakness on one side of the body -trouble passing urine or change in the amount of urine -unexplained weight gain or swelling -unusually weak or tired -yellowing of eyes or skin Side effects that usually do not require medical attention (Report these to your doctor or health care professional if they continue or are bothersome.): -increased sensitivity of the skin to sun and ultraviolet light -pain and burning of the affected area -scaling or swelling of the affected area -skin rash, itching of the affected area -tenderness This list may not describe all possible side effects. Call your doctor for medical advice about side effects. You may report side effects to FDA at 1-800-FDA-1088. Where should I keep my medicine? Keep out of the reach of children. Store at room temperature between 20 and 25 degrees C (68 and 77 degrees F). Throw away any unused medicine after the expiration date. NOTE: This sheet is a summary. It may not cover all possible information. If you have questions about this medicine, talk to your doctor, pharmacist, or health care provider.  2015, Elsevier/Gold Standard. (2013-07-18 11:09:58)  

## 2014-07-23 ENCOUNTER — Emergency Department (HOSPITAL_COMMUNITY): Payer: 59

## 2014-07-23 ENCOUNTER — Encounter (HOSPITAL_COMMUNITY): Payer: Self-pay | Admitting: Emergency Medicine

## 2014-07-23 ENCOUNTER — Emergency Department (HOSPITAL_COMMUNITY)
Admission: EM | Admit: 2014-07-23 | Discharge: 2014-07-23 | Disposition: A | Discharge status: Home / Self Care | Payer: 59 | Attending: Emergency Medicine | Admitting: Emergency Medicine

## 2014-07-23 DIAGNOSIS — Z880 Allergy status to penicillin: Secondary | ICD-10-CM | POA: Insufficient documentation

## 2014-07-23 DIAGNOSIS — Z8719 Personal history of other diseases of the digestive system: Secondary | ICD-10-CM | POA: Diagnosis not present

## 2014-07-23 DIAGNOSIS — Z87891 Personal history of nicotine dependence: Secondary | ICD-10-CM | POA: Diagnosis not present

## 2014-07-23 DIAGNOSIS — R1031 Right lower quadrant pain: Secondary | ICD-10-CM | POA: Diagnosis present

## 2014-07-23 DIAGNOSIS — Z79899 Other long term (current) drug therapy: Secondary | ICD-10-CM | POA: Insufficient documentation

## 2014-07-23 DIAGNOSIS — C189 Malignant neoplasm of colon, unspecified: Secondary | ICD-10-CM

## 2014-07-23 DIAGNOSIS — Z859 Personal history of malignant neoplasm, unspecified: Secondary | ICD-10-CM | POA: Diagnosis not present

## 2014-07-23 DIAGNOSIS — C785 Secondary malignant neoplasm of large intestine and rectum: Secondary | ICD-10-CM | POA: Insufficient documentation

## 2014-07-23 LAB — URINALYSIS, ROUTINE W REFLEX MICROSCOPIC
BILIRUBIN URINE: NEGATIVE
Glucose, UA: NEGATIVE mg/dL
Hgb urine dipstick: NEGATIVE
KETONES UR: NEGATIVE mg/dL
Leukocytes, UA: NEGATIVE
Nitrite: NEGATIVE
Protein, ur: 30 mg/dL — AB
Urobilinogen, UA: 0.2 mg/dL (ref 0.0–1.0)
pH: 6 (ref 5.0–8.0)

## 2014-07-23 LAB — COMPREHENSIVE METABOLIC PANEL
ALK PHOS: 185 U/L — AB (ref 39–117)
ALT: 16 U/L (ref 0–35)
AST: 31 U/L (ref 0–37)
Albumin: 3.3 g/dL — ABNORMAL LOW (ref 3.5–5.2)
Anion gap: 10 (ref 5–15)
BUN: 24 mg/dL — ABNORMAL HIGH (ref 6–23)
CO2: 26 mmol/L (ref 19–32)
Calcium: 8.7 mg/dL (ref 8.4–10.5)
Chloride: 99 mmol/L (ref 96–112)
Creatinine, Ser: 0.56 mg/dL (ref 0.50–1.10)
GFR calc Af Amer: >90 mL/min (ref >90)
GFR calc non Af Amer: >90 mL/min (ref >90)
GLUCOSE: 96 mg/dL (ref 70–99)
POTASSIUM: 4.1 mmol/L (ref 3.5–5.1)
SODIUM: 135 mmol/L (ref 135–145)
TOTAL PROTEIN: 7 g/dL (ref 6.0–8.3)
Total Bilirubin: 0.4 mg/dL (ref 0.3–1.2)

## 2014-07-23 LAB — URINE MICROSCOPIC-ADD ON

## 2014-07-23 LAB — CBG MONITORING, ED: Glucose-Capillary: 102 mg/dL — ABNORMAL HIGH (ref 70–99)

## 2014-07-23 LAB — CBC WITH DIFFERENTIAL/PLATELET
BASOS ABS: 0 10*3/uL (ref 0.0–0.1)
Basophils Relative: 0 % (ref 0–1)
EOS ABS: 0.2 10*3/uL (ref 0.0–0.7)
Eosinophils Relative: 2 % (ref 0–5)
HCT: 25.9 % — ABNORMAL LOW (ref 36.0–46.0)
Hemoglobin: 8.2 g/dL — ABNORMAL LOW (ref 12.0–15.0)
Lymphocytes Relative: 18 % (ref 12–46)
Lymphs Abs: 1.8 10*3/uL (ref 0.7–4.0)
MCH: 27.1 pg (ref 26.0–34.0)
MCHC: 31.7 g/dL (ref 30.0–36.0)
MCV: 85.5 fL (ref 78.0–100.0)
MONO ABS: 0.3 10*3/uL (ref 0.1–1.0)
MONOS PCT: 3 % (ref 3–12)
NEUTROS ABS: 8 10*3/uL — AB (ref 1.7–7.7)
Neutrophils Relative %: 77 % (ref 43–77)
Platelets: 202 10*3/uL (ref 150–400)
RBC: 3.03 MIL/uL — ABNORMAL LOW (ref 3.87–5.11)
RDW: 14 % (ref 11.5–15.5)
WBC: 10.4 10*3/uL (ref 4.0–10.5)

## 2014-07-23 LAB — LIPASE, BLOOD: Lipase: 26 U/L (ref 11–59)

## 2014-07-23 MED ORDER — ONDANSETRON HCL 4 MG/2ML IJ SOLN
4.0000 mg | Freq: Once | INTRAMUSCULAR | Status: AC
  Administered 2014-07-23: 4 mg via INTRAVENOUS
  Filled 2014-07-23: qty 2

## 2014-07-23 MED ORDER — TRAMADOL HCL 50 MG PO TABS: 50.0000 mg | ORAL_TABLET | Freq: Four times a day (QID) | ORAL | Status: DC | PRN

## 2014-07-23 MED ORDER — NICOTINE 14 MG/24HR TD PT24
14.0000 mg | MEDICATED_PATCH | Freq: Once | TRANSDERMAL | Status: DC
  Administered 2014-07-23: 14 mg via TRANSDERMAL
  Filled 2014-07-23: qty 1

## 2014-07-23 MED ORDER — HEPARIN SOD (PORK) LOCK FLUSH 100 UNIT/ML IV SOLN
INTRAVENOUS | Status: AC
  Administered 2014-07-23: 22:00:00
  Filled 2014-07-23: qty 5

## 2014-07-23 MED ORDER — FENTANYL CITRATE (PF) 100 MCG/2ML IJ SOLN
50.0000 ug | Freq: Once | INTRAMUSCULAR | Status: AC
  Administered 2014-07-23: 50 ug via INTRAVENOUS
  Filled 2014-07-23: qty 2

## 2014-07-23 MED ORDER — IOHEXOL 300 MG/ML  SOLN
50.0000 mL | Freq: Once | INTRAMUSCULAR | Status: AC | PRN
  Administered 2014-07-23: 50 mL via ORAL

## 2014-07-23 MED ORDER — IOHEXOL 300 MG/ML  SOLN
100.0000 mL | Freq: Once | INTRAMUSCULAR | Status: AC | PRN
  Administered 2014-07-23: 100 mL via INTRAVENOUS

## 2014-07-23 MED ORDER — FENTANYL CITRATE (PF) 100 MCG/2ML IJ SOLN
50.0000 ug | Freq: Once | INTRAMUSCULAR | Status: AC
Start: 2014-07-23 — End: 2014-07-23
  Administered 2014-07-23: 50 ug via INTRAVENOUS
  Filled 2014-07-23: qty 2

## 2014-07-23 NOTE — ED Provider Notes (Signed)
CSN: [id number]     Arrival date & time 07/23/14  1506 History   First MD Initiated Contact with Patient 07/23/14 1516     Chief Complaint  Patient presents with  . Abdominal Pain     (Consider location/radiation/quality/duration/timing/severity/associated sxs/prior Treatment) HPI..... Right lower quadrant pain since last night. Pain is worse with movement and palpation. She is nauseated but did not vomit. She was diagnosed with colon cancer approximately one month ago. Status post rectal stent by Dr. Benson Norway.  She is not taking any pain medicine because it does not agree with her. She takes Antivert for nausea. Long history of Crohn's disease. Severity is moderate.  Past Medical History  Diagnosis Date  . Crohn's disease   . Chronic headache   . Cancer    Past Surgical History  Procedure Laterality Date  . Breast surgery    . Uterine fibroid surgery    . Colonoscopy w/ biopsies  06/29/2014    DR HUNG  . Flexible sigmoidoscopy N/A 06/30/2014    Procedure: FLEXIBLE SIGMOIDOSCOPY;  Surgeon: Carol Ada, MD;  [facility] END[facility]COPY;  Service: Endoscopy;  Laterality: N/A;  . Colonic stent placement N/A 06/30/2014    Procedure: COLONIC STENT PLACEMENT;  Surgeon: Carol Ada, MD;  Location: [facility] ENDOSCOPY;  Service: Endoscopy;  Laterality: N/A;  with fluro   . Portacath placement N/A 07/04/2014    Procedure: POWER PORT PLACEMENT;  Surgeon: Alphonsa Overall, MD;  Location: [place];  Service: General;  Laterality: N/A;   Family History  Problem Relation Age of Onset  . Hypertension Mother   . Cancer Father    History  Substance Use Topics  . Smoking status: Former Smoker -- 0.50 packs/day for 40 years    Types: Cigarettes    Quit date: 06/25/2014  . Smokeless tobacco: Never Used  . Alcohol Use: No   OB History    Gravida Para Term Preterm AB TAB SAB Ectopic Multiple Living   3 2 2  1  1         Review of Systems  All other systems reviewed and are negative.     Allergies  Avelox;  Codeine; Dextromethorphan; Erythromycin; Flagyl; Penicillins; and Suprep  Home Medications   Prior to Admission medications   Medication Sig Start Date End Date Taking? Authorizing Provider  acetaminophen (TYLENOL) 325 MG tablet Take 650 mg by mouth every 6 (six) hours as needed for mild pain or fever.   Yes Historical Provider, MD  cromolyn (NASALCROM) 5.2 MG/ACT nasal spray Place 1 spray into both nostrils at bedtime.   Yes Historical Provider, MD  Cyanocobalamin (VITAMIN B-12 CR) 1500 MCG TBCR Take 1 tablet by mouth daily.   Yes Historical Provider, MD  Fluorouracil (ADRUCIL IV) Inject into the vein every 14 (fourteen) days.   Yes Historical Provider, MD  leucovorin in dextrose 5 % 250 mL Inject into the vein every 14 (fourteen) days.   Yes Historical Provider, MD  LORazepam (ATIVAN) 0.5 MG tablet Take 1 tablet (0.5 mg total) by mouth every 6 (six) hours as needed for anxiety. 07/21/14  Yes Ladell Pier, MD  nicotine (NICODERM CQ - DOSED IN MG/24 HOURS) 14 mg/24hr patch Place 14 mg onto the skin daily.   Yes Historical Provider, MD  ondansetron (ZOFRAN-ODT) 4 MG disintegrating tablet Take 4 mg by mouth every 8 (eight) hours as needed for nausea or vomiting.   Yes Historical Provider, MD  oxaliplatin in dextrose 5 % 500 mL Inject into the vein every  14 (fourteen) days.   Yes Historical Provider, MD  ALPRAZolam Duanne Moron) 0.5 MG tablet Take 0.5 mg by mouth at bedtime as needed for anxiety.    Historical Provider, MD  cromolyn (NASALCROM) 5.2 MG/ACT nasal spray Place 1 spray into both nostrils at bedtime.    Historical Provider, MD  Cyanocobalamin 1500 MCG TBDP Take 1 tablet by mouth daily.    Historical Provider, MD  lidocaine-prilocaine (EMLA) cream Apply 1 application topically as needed. Apply to portacath site 1-2 hours prior to use 07/14/14   Owens Shark, NP  meclizine (ANTIVERT) 25 MG tablet Take 1 tablet (25 mg total) by mouth 3 (three) times daily as needed for dizziness. Patient not  taking: Reported on 07/14/2014 05/31/14   Francine Graven, DO  ondansetron (ZOFRAN ODT) 4 MG disintegrating tablet Take 1 tablet (4 mg total) by mouth every 8 (eight) hours as needed for nausea or vomiting. Patient not taking: Reported on 07/14/2014 05/31/14   Francine Graven, DO  prochlorperazine (COMPAZINE) 10 MG tablet Take 1 tablet (10 mg total) by mouth every 6 (six) hours as needed for nausea or vomiting. Patient not taking: Reported on 07/23/2014 07/20/14   Ladell Pier, MD  traMADol (ULTRAM) 50 MG tablet Take 1 tablet (50 mg total) by mouth every 6 (six) hours as needed. 07/23/14   Nat Christen, MD  zolpidem (AMBIEN) 5 MG tablet Take 1 tablet (5 mg total) by mouth at bedtime as needed for sleep. Patient not taking: Reported on 07/23/2014 07/14/14   Owens Shark, NP   BP 140/61 mmHg  Pulse 78  Temp(Src) 98.7 F (37.1 C) (Oral)  Resp 17  Ht 5\' 7"  (1.702 m)  Wt 114 lb 5 oz (51.852 kg)  BMI 17.90 kg/m2  SpO2 98% Physical Exam  Constitutional: She is oriented to person, place, and time. She appears well-developed and well-nourished.  HENT:  Head: Normocephalic and atraumatic.  Eyes: Conjunctivae and EOM are normal. Pupils are equal, round, and reactive to light.  Neck: Normal range of motion. Neck supple.  Cardiovascular: Normal rate and regular rhythm.   Pulmonary/Chest: Effort normal and breath sounds normal.  Abdominal: Soft. Bowel sounds are normal.  Tenderness in right lower quadrant  Musculoskeletal: Normal range of motion.  Neurological: She is alert and oriented to person, place, and time.  Skin: Skin is warm and dry.  Psychiatric: She has a normal mood and affect. Her behavior is normal.  Nursing note and vitals reviewed.   ED Course  Procedures (including critical care time) Labs Review Labs Reviewed  COMPREHENSIVE METABOLIC PANEL - Abnormal; Notable for the following:    BUN 24 (*)    Albumin 3.3 (*)    Alkaline Phosphatase 185 (*)    All other components within  normal limits  CBC WITH DIFFERENTIAL/PLATELET - Abnormal; Notable for the following:    RBC 3.03 (*)    Hemoglobin 8.2 (*)    HCT 25.9 (*)    Neutro Abs 8.0 (*)    All other components within normal limits  URINALYSIS, ROUTINE W REFLEX MICROSCOPIC - Abnormal; Notable for the following:    APPearance HAZY (*)    Specific Gravity, Urine >1.030 (*)    Protein, ur 30 (*)    All other components within normal limits  URINE MICROSCOPIC-ADD ON - Abnormal; Notable for the following:    Squamous Epithelial / LPF MANY (*)    Bacteria, UA FEW (*)    All other components within normal limits  CBG  MONITORING, ED - Abnormal; Notable for the following:    Glucose-Capillary 102 (*)    All other components within normal limits  LIPASE, BLOOD    Imaging Review Ct Abdomen Pelvis W Contrast  07/23/2014   CLINICAL DATA:  59 year old female with right lower quadrant pain. Past medical history includes Crohn's disease and recent diagnosis of stage IV colon cancer. Colonic stent placed on 06/30/2014.  EXAM: CT ABDOMEN AND PELVIS WITH CONTRAST  TECHNIQUE: Multidetector CT imaging of the abdomen and pelvis was performed using the standard protocol following bolus administration of intravenous contrast.  CONTRAST:  59mL OMNIPAQUE IOHEXOL 300 MG/ML SOLN, 116mL OMNIPAQUE IOHEXOL 300 MG/ML SOLN  COMPARISON:  Most recent CT scan of the abdomen and pelvis 07/02/2014; barium enema images 07/10/2014  FINDINGS: Lower Chest: Dependent atelectasis in the left lower lobe. Solitary 4 mm subpleural pulmonary nodule in the periphery of the left lower lobe remains unchanged. Unremarkable visualized heart. Normal distal thoracic esophagus.  Abdomen: Extensive hepatic metastatic disease is again noted. There are innumerable lesions throughout both the right and left liver. No significant interval change compared to relatively recent prior imaging. The largest lesion occupying the majority of the left hepatic lobe measures at least 9.1  cm. An index lesion in the right hemi liver anteriorly measures 4.7 cm compared to 4.4 cm previously suggesting the possibility of interval progression. A second index lesion in the hepatic dome measures 2.4 cm compared to 1.9 cm previously. Portal veins remain patent.  Similar appearance of extensive peritoneal metastatic disease and omental caking in the lower abdomen and pelvis. Irregular wall thickening of the rectosigmoid junction consistent with known primary malignancy. The previously placed colonic stent remains in similar position. The stent appears patent. There is no evidence proximal obstruction. Of note, the lateral margin of the proximal end of the stent tense than mucosa. No evidence of perforation at this time.  Unremarkable CT appearance of the stomach, duodenum, spleen, adrenal glands and pancreas. Unremarkable appearance of the bilateral kidneys. No focal solid lesion, hydronephrosis or nephrolithiasis. The appendix remains normal.  Pelvis: Extensive peritoneal metastatic disease filling the pelvic cul-de-sac. Small volume free fluid likely malignant ascites.  Bones/Soft Tissues: No acute fracture or aggressive appearing lytic or blastic osseous lesion.  Vascular: No significant atherosclerotic vascular disease, aneurysmal dilatation or acute abnormality.  IMPRESSION: 1. No definite acute abnormality in the abdomen or pelvis to explain the patient's new onset right lower quadrant pain. Specifically, the appendix remains normal. 2. Similar findings of advanced stage IV metastatic colon cancer with perhaps slight interval progression of disease in the liver. 3. Well-positioned rectosigmoid colonic stent. Of note, the lateral margin of the proximal landing zone of the stent has further dilated since the prior study and now indents the mucosal wall. There is no evidence of perforation at this time.   Electronically Signed   By: Jacqulynn Cadet M.D.   On: 07/23/2014 19:23     EKG  Interpretation None      MDM   Final diagnoses:  RLQ abdominal pain  Colon cancer    Uncertain etiology of patient's pain. She has stage IV metastatic colon cancer with a slight interval progression of disease in the liver. Rectosigmoid colonic stent is well-positioned. No acute abdomen. She feels better after IV fentanyl. Discharge medications Ultram. She will follow-up with her oncologist earlier this week    Nat Christen, MD 07/23/14 2157

## 2014-07-23 NOTE — ED Notes (Signed)
Pt started chemo Wednesday for Colon Cancer. Started having sharp stabbing pains to lateral right lower quad last night. Any movement or palpation makes pain worse. Pt jittery/shaky, states she has not slept and is in pain. Nausea but no vomiting. lnbm x 2 months ago. Has had diarrhea since, has stent in colon. Pt denies bm changes since pain started. GU wnl per pt.

## 2014-07-23 NOTE — Discharge Instructions (Signed)
CT scan showed no acute findings. Follow-up with your oncologist. Prescription for pain medicine. You can take a sleeping pill tonight.

## 2014-07-23 NOTE — ED Notes (Signed)
MD at bedside. 

## 2014-07-25 ENCOUNTER — Encounter (HOSPITAL_COMMUNITY): Payer: Self-pay

## 2014-07-26 ENCOUNTER — Telehealth: Payer: Self-pay | Admitting: *Deleted

## 2014-07-26 ENCOUNTER — Other Ambulatory Visit (HOSPITAL_COMMUNITY)
Admission: RE | Admit: 2014-07-26 | Discharge: 2014-07-26 | Disposition: A | Discharge status: Home / Self Care | Payer: 59 | Source: Ambulatory Visit | Attending: Oncology | Admitting: Oncology

## 2014-07-26 ENCOUNTER — Encounter: Payer: Self-pay | Admitting: *Deleted

## 2014-07-26 ENCOUNTER — Encounter: Payer: Self-pay | Admitting: Skilled Nursing Facility1

## 2014-07-26 DIAGNOSIS — C189 Malignant neoplasm of colon, unspecified: Secondary | ICD-10-CM | POA: Diagnosis present

## 2014-07-26 NOTE — Telephone Encounter (Signed)
Called to follow up on status since 1st chemo: Reports she had a lot of nausea and felt worse when she took the zofran and after hours triage told her not to take it anymore, so she discarded the bottle. Did not want to take the compazine that was ordered. Was given script for Ativan later that was helpful with nausea and her anxiety. Still having some diarrhea (small amounts-frequent) but no different than her usual. Eats about 1 meal/day and drinks 4-5 cans of Ensure daily. Had severe pain in her right abdomen on 07/22/14 to point she when to [place]. Workup was negative. Still has some mild pain rated 4/10. Has been trying to stay active, but has not left her home due to being afraid of crowds and going out aggravates her seasonal allergies. She denies depression or any crying episodes-feels she is coping well at this time. She will see Dr. Benay Spice prior to her next treatment. Denies any educational or physical needs at this time.

## 2014-07-26 NOTE — Progress Notes (Signed)
Subjective:     Patient ID: Sydney Morrison, female   DOB: 1955/11/22, 59 y.o.   MRN: [id number]  HPI   Review of Systems     Objective:   Physical Exam To assist the pt in identifing some dietary strategies for gaining some lost wt back.    Assessment:     Pt identified as being malnourished due to lost wt. Pt contacted via the telephone [phone]) but pt did not answer the phone so a voicemail was left.     Plan:     Dietitian left Sydney Morrison RD,LDN,CSO contact information and encouraged the pt to call her at their earliest convenience.

## 2014-07-26 NOTE — [facility] Oncology Navigator Note (Signed)
[facility] Testing requested on the following per Dr. Benay Spice:  Patient: Sydney Morrison Collected: 07/05/2014 Client: [facility] Accession: [id number] Received: 07/05/2014 Aletta Edouard DOB: 09/30/1955 Age: 59 Gender: F Reported: 07/06/2014 [address] Patient Ph: [phone] MRN #: [id number] [place], [place] [address] Visit #: [id number] Chart #: Phone:  Fax: CC: Gwenlyn Perking, MD REPORT

## 2014-07-30 ENCOUNTER — Other Ambulatory Visit: Payer: Self-pay | Admitting: Oncology

## 2014-07-31 ENCOUNTER — Telehealth: Payer: Self-pay | Admitting: *Deleted

## 2014-07-31 NOTE — Telephone Encounter (Signed)
Received fax on 07/28/14, sample was received. Results should be expected by 08/10/14.

## 2014-08-01 ENCOUNTER — Telehealth: Payer: Self-pay

## 2014-08-01 NOTE — Telephone Encounter (Signed)
Pt lvm that she has a vaginal yeast infection. She will start monistat this evening. She sees Dance [facility] psychotherapist and starts second cycyle chemo. I called her back and told her it was OK to use the monistat, and Dr Benay Spice and Lattie Haw will decide tomorrow if she moves forward with the chemo.

## 2014-08-01 NOTE — Telephone Encounter (Signed)
lvm that Dr Benay Spice said monistat is OK to use, also yeast infection should not stop her from receiving chemo tomorrow.

## 2014-08-02 ENCOUNTER — Telehealth: Payer: Self-pay | Admitting: *Deleted

## 2014-08-02 ENCOUNTER — Ambulatory Visit (HOSPITAL_BASED_OUTPATIENT_CLINIC_OR_DEPARTMENT_OTHER): Payer: 59

## 2014-08-02 ENCOUNTER — Telehealth: Payer: Self-pay | Admitting: Nurse Practitioner

## 2014-08-02 ENCOUNTER — Ambulatory Visit (HOSPITAL_BASED_OUTPATIENT_CLINIC_OR_DEPARTMENT_OTHER): Payer: 59 | Admitting: Nurse Practitioner

## 2014-08-02 ENCOUNTER — Other Ambulatory Visit (HOSPITAL_BASED_OUTPATIENT_CLINIC_OR_DEPARTMENT_OTHER): Payer: 59

## 2014-08-02 ENCOUNTER — Telehealth: Payer: Self-pay | Admitting: Hematology and Oncology

## 2014-08-02 VITALS — BP 133/69 | HR 87 | Temp 97.9°F | Resp 19 | Ht 67.0 in | Wt 113.0 lb

## 2014-08-02 VITALS — BP 149/65 | HR 78 | Temp 97.8°F | Resp 20

## 2014-08-02 DIAGNOSIS — Z5111 Encounter for antineoplastic chemotherapy: Secondary | ICD-10-CM | POA: Diagnosis not present

## 2014-08-02 DIAGNOSIS — C787 Secondary malignant neoplasm of liver and intrahepatic bile duct: Secondary | ICD-10-CM

## 2014-08-02 DIAGNOSIS — C189 Malignant neoplasm of colon, unspecified: Secondary | ICD-10-CM

## 2014-08-02 DIAGNOSIS — R110 Nausea: Secondary | ICD-10-CM

## 2014-08-02 DIAGNOSIS — D500 Iron deficiency anemia secondary to blood loss (chronic): Secondary | ICD-10-CM | POA: Diagnosis not present

## 2014-08-02 DIAGNOSIS — C786 Secondary malignant neoplasm of retroperitoneum and peritoneum: Secondary | ICD-10-CM | POA: Diagnosis not present

## 2014-08-02 DIAGNOSIS — C20 Malignant neoplasm of rectum: Secondary | ICD-10-CM

## 2014-08-02 DIAGNOSIS — D509 Iron deficiency anemia, unspecified: Secondary | ICD-10-CM | POA: Diagnosis not present

## 2014-08-02 LAB — COMPREHENSIVE METABOLIC PANEL (CC13)
ALBUMIN: 3.6 g/dL (ref 3.5–5.0)
ALK PHOS: 175 U/L — AB (ref 40–150)
ALT: 14 U/L (ref 0–55)
AST: 21 U/L (ref 5–34)
Anion Gap: 13 mEq/L — ABNORMAL HIGH (ref 3–11)
BUN: 22 mg/dL (ref 7.0–26.0)
CO2: 25 mEq/L (ref 22–29)
Calcium: 9.6 mg/dL (ref 8.4–10.4)
Chloride: 100 mEq/L (ref 98–109)
Creatinine: 0.7 mg/dL (ref 0.6–1.1)
GLUCOSE: 96 mg/dL (ref 70–140)
POTASSIUM: 4.4 meq/L (ref 3.5–5.1)
Sodium: 138 mEq/L (ref 136–145)
Total Bilirubin: 0.28 mg/dL (ref 0.20–1.20)
Total Protein: 7.6 g/dL (ref 6.4–8.3)

## 2014-08-02 LAB — CBC WITH DIFFERENTIAL/PLATELET
BASO%: 0.5 % (ref 0.0–2.0)
BASOS ABS: 0 10*3/uL (ref 0.0–0.1)
EOS%: 2.8 % (ref 0.0–7.0)
Eosinophils Absolute: 0.2 10*3/uL (ref 0.0–0.5)
HEMATOCRIT: 30.4 % — AB (ref 34.8–46.6)
HGB: 9.4 g/dL — ABNORMAL LOW (ref 11.6–15.9)
LYMPH#: 1.3 10*3/uL (ref 0.9–3.3)
LYMPH%: 22.5 % (ref 14.0–49.7)
MCH: 26.3 pg (ref 25.1–34.0)
MCHC: 30.9 g/dL — ABNORMAL LOW (ref 31.5–36.0)
MCV: 84.9 fL (ref 79.5–101.0)
MONO#: 0.7 10*3/uL (ref 0.1–0.9)
MONO%: 12.7 % (ref 0.0–14.0)
NEUT%: 61.5 % (ref 38.4–76.8)
NEUTROS ABS: 3.6 10*3/uL (ref 1.5–6.5)
PLATELETS: 221 10*3/uL (ref 145–400)
RBC: 3.58 10*6/uL — ABNORMAL LOW (ref 3.70–5.45)
RDW: 14.4 % (ref 11.2–14.5)
WBC: 5.8 10*3/uL (ref 3.9–10.3)

## 2014-08-02 MED ORDER — LORAZEPAM 2 MG/ML IJ SOLN
0.5000 mg | Freq: Once | INTRAMUSCULAR | Status: AC
  Administered 2014-08-02: 0.5 mg via INTRAVENOUS

## 2014-08-02 MED ORDER — SODIUM CHLORIDE 0.9 % IV SOLN
Freq: Once | INTRAVENOUS | Status: AC
  Administered 2014-08-02: 13:00:00 via INTRAVENOUS
  Filled 2014-08-02: qty 5

## 2014-08-02 MED ORDER — OXALIPLATIN CHEMO INJECTION 100 MG/20ML
85.0000 mg/m2 | Freq: Once | INTRAVENOUS | Status: AC
  Administered 2014-08-02: 135 mg via INTRAVENOUS
  Filled 2014-08-02: qty 27

## 2014-08-02 MED ORDER — LEUCOVORIN CALCIUM INJECTION 350 MG
400.0000 mg/m2 | Freq: Once | INTRAVENOUS | Status: AC
  Administered 2014-08-02: 640 mg via INTRAVENOUS
  Filled 2014-08-02: qty 32

## 2014-08-02 MED ORDER — DEXTROSE 5 % IV SOLN
Freq: Once | INTRAVENOUS | Status: AC
  Administered 2014-08-02: 13:00:00 via INTRAVENOUS

## 2014-08-02 MED ORDER — PALONOSETRON HCL INJECTION 0.25 MG/5ML
INTRAVENOUS | Status: AC
  Filled 2014-08-02: qty 5

## 2014-08-02 MED ORDER — FLUOROURACIL CHEMO INJECTION 2.5 GM/50ML
400.0000 mg/m2 | Freq: Once | INTRAVENOUS | Status: AC
  Administered 2014-08-02: 650 mg via INTRAVENOUS
  Filled 2014-08-02: qty 13

## 2014-08-02 MED ORDER — PALONOSETRON HCL INJECTION 0.25 MG/5ML
0.2500 mg | Freq: Once | INTRAVENOUS | Status: AC
  Administered 2014-08-02: 0.25 mg via INTRAVENOUS

## 2014-08-02 MED ORDER — SODIUM CHLORIDE 0.9 % IV SOLN
2400.0000 mg/m2 | INTRAVENOUS | Status: DC
  Administered 2014-08-02: 3850 mg via INTRAVENOUS
  Filled 2014-08-02: qty 77

## 2014-08-02 NOTE — Telephone Encounter (Signed)
per pof to sch pt appt-sent MW email to sch pt trmt-pt to get updated copy of [place]

## 2014-08-02 NOTE — Progress Notes (Signed)
  [facility] OFFICE PROGRESS NOTE   Diagnosis:  Colon cancer  INTERVAL HISTORY:   Sydney Morrison returns as scheduled. She completed cycle 1 FOLFOX 07/19/2014. She had nausea beginning day 2. Zofran was not effective. She was prescribed Ativan on day 3. She noted relief of nausea following Ativan. She continues to have mild intermittent nausea. No vomiting. No mouth sores. No change in baseline diarrhea. She did not experience cold sensitivity. On 07/23/2014 she noted sudden onset of severe pain at the right upper abdomen. She was seen in the emergency department. CT scan showed no significant change when compared to the previous. The pain has slowly improved. She has tramadol but has not taken it. She has had no fever for the past 3 days.  Objective:  Vital signs in last 24 hours:  Blood pressure 133/69, pulse 87, temperature 97.9 F (36.6 C), temperature source Oral, resp. rate 19, height 5\' 7"  (1.702 m), weight 113 lb (51.256 kg), SpO2 99 %.    HEENT: No thrush or ulcers. Resp: Lungs clear bilaterally. Cardio: Regular rate and rhythm. GI: Abdomen soft and nontender. Liver palpable right upper abdomen. Vascular: No leg edema. Calves soft and nontender. Neuro: Alert and oriented.  Skin: No rash. Port-A-Cath without erythema.    Lab Results:  Lab Results  Component Value Date   WBC 5.8 08/02/2014   HGB 9.4* 08/02/2014   HCT 30.4* 08/02/2014   MCV 84.9 08/02/2014   PLT 221 08/02/2014   NEUTROABS 3.6 08/02/2014    Imaging:  No results found.  Medications: I have reviewed the patient's current medications.  Assessment/Plan: 1. Rectal cancer  Obstructing mass at 10 cm from the anal verge noted on a colonoscopy 06/29/2014  CT chest, abdomen and pelvis 06/29/2014 revealed a partially obstructing rectosigmoid mass, widespread peritoneal metastatic disease in the pelvis, and diffuse liver metastases  Biopsy of a liver lesion 07/05/2014 confirmed metastatic  adenocarcinoma  Cycle 1 FOLFOX 07/19/2014  Cycle 2 FOLFOX 08/02/2014  2. Status post placement of a rectal stent on 06/30/2014, placed on bowel rest and IV antibiotics secondary to a possible microperforation  3. Crohn's disease  4. Anemia secondary to rectal bleeding and multiple blood draws  5. Anorexia/weight loss  6. Fever-tumor fever?, Microperforation related to the rectal stent?-Improved  7. Status post Port-A-Cath placement 07/04/2014  8. Left pneumothorax following the Port-A-Cath placement, status post chest tube placement 07/05/2014. Chest tube removed 07/08/2014.  9. Acute onset pain right upper abdomen 07/23/2014 status post emergency room evaluation. Likely secondary to liver metastases. Improved.  10. Delayed nausea following cycle 1 FOLFOX. Aloxi and Emend added with cycle 2.    Disposition: Sydney Morrison appears stable. She has completed 1 cycle of FOLFOX. Plan to proceed with cycle 2 today as scheduled.  She had delayed nausea. We will add Aloxi and Emend to the premedications today. She will take Ativan as needed. She will contact the office if she has nausea despite these measures.  She will return for a follow-up visit and cycle 3 FOLFOX in 2 weeks. She will contact the office in the interim as outlined above or with any other problems.  Plan reviewed with Dr. Benay Spice.    Ned Card ANP/GNP-BC   08/02/2014  12:05 PM

## 2014-08-02 NOTE — Patient Instructions (Signed)
[facility] Discharge Instructions for Patients Receiving Chemotherapy  Today you received the following chemotherapy agents: Oxaliplatin, Leucovorin, 5FU   To help prevent nausea and vomiting after your treatment, we encourage you to take your nausea medication as prescribed.  You may begin taking zofran for nausea as needed on Day 3 after chemo You may take Ativan and Compazine as needed for nausea beginning now; try to alternate these medications as taking them together may increase drowsiness.    If you develop nausea and vomiting that is not controlled by your nausea medication, call the clinic.   BELOW ARE SYMPTOMS THAT SHOULD BE REPORTED IMMEDIATELY:  *FEVER GREATER THAN 100.5 F  *CHILLS WITH OR WITHOUT FEVER  NAUSEA AND VOMITING THAT IS NOT CONTROLLED WITH YOUR NAUSEA MEDICATION  *UNUSUAL SHORTNESS OF BREATH  *UNUSUAL BRUISING OR BLEEDING  TENDERNESS IN MOUTH AND THROAT WITH OR WITHOUT PRESENCE OF ULCERS  *URINARY PROBLEMS  *BOWEL PROBLEMS  UNUSUAL RASH Items with * indicate a potential emergency and should be followed up as soon as possible.  Feel free to call the clinic you have any questions or concerns. The clinic phone number is [phone].  Please show the [place] at check-in to the Emergency Department and triage nurse.

## 2014-08-02 NOTE — Progress Notes (Signed)
At completion of leucovorin and oxaliplatin, patient reports feeling nauseous and "sick all over" with sweating and excessive salivation. No emesis at this time. Slight "wooziness" when ambulating to bathroom with spouse. Verbal order obtained for ativan 0.5 mg per Ned Card, NP.   Patient reports improvement in nausea at time of discharge. Reviewed anti-emetics and schedule with patient and family prior to discharge. Informed to call [facility] with worsening nausea and if it does not respond to prn medication. They verbalizes understanding of instructions.

## 2014-08-02 NOTE — Telephone Encounter (Signed)
per pof to sch pt appt-gave pt copy of sch °

## 2014-08-02 NOTE — Telephone Encounter (Signed)
Met with patient and her husband in treatment area to assess for needs/concerns she may have. She denies any needs at this time. Hopeful that the Aloxi and Emend will help her nausea. Knows to use the ativan for 1st 72 hours after treatment. Pain has diminished to tolerable level. Instructed her to reach out if she has any needs/problems.

## 2014-08-04 ENCOUNTER — Ambulatory Visit (HOSPITAL_BASED_OUTPATIENT_CLINIC_OR_DEPARTMENT_OTHER): Payer: 59

## 2014-08-04 VITALS — BP 113/56 | HR 65 | Temp 98.5°F

## 2014-08-04 DIAGNOSIS — Z452 Encounter for adjustment and management of vascular access device: Secondary | ICD-10-CM

## 2014-08-04 DIAGNOSIS — C20 Malignant neoplasm of rectum: Secondary | ICD-10-CM | POA: Diagnosis not present

## 2014-08-04 DIAGNOSIS — C189 Malignant neoplasm of colon, unspecified: Secondary | ICD-10-CM

## 2014-08-04 MED ORDER — SODIUM CHLORIDE 0.9 % IJ SOLN
10.0000 mL | INTRAMUSCULAR | Status: DC | PRN
Start: 2014-08-04 — End: 2014-08-04
  Administered 2014-08-04: 10 mL
  Filled 2014-08-04: qty 10

## 2014-08-04 MED ORDER — HEPARIN SOD (PORK) LOCK FLUSH 100 UNIT/ML IV SOLN
500.0000 [IU] | Freq: Once | INTRAVENOUS | Status: AC | PRN
  Administered 2014-08-04: 500 [IU]
  Filled 2014-08-04: qty 5

## 2014-08-07 ENCOUNTER — Telehealth: Payer: Self-pay | Admitting: Nurse Practitioner

## 2014-08-07 NOTE — Telephone Encounter (Signed)
per pof to sch appt-cld pt to adv of appt time & date

## 2014-08-09 ENCOUNTER — Encounter: Payer: Self-pay | Admitting: Skilled Nursing Facility1

## 2014-08-09 ENCOUNTER — Other Ambulatory Visit: Payer: 59

## 2014-08-09 NOTE — Progress Notes (Signed)
Subjective:     Patient ID: Sydney Morrison, female   DOB: [birth year], 59 y.o.   MRN: [id number]  HPI   Review of Systems     Objective:   Physical Exam To help pt identify some dietary strategies to gain lost wt back.    Assessment:     Pt identified as being malnourished due to lost wt. Pt was contacted via the telephone at [phone]. Pt states she was 135 pounds but is now 113 pounds. Pt staets she trys to consume 2 ensure pluses a day and 2 ensures a day. Pt states she has absolutely no appetite and has diarrhea "about 15 times a day". Pt states she will eat saltine crackers to settle her stomach but it does not work. Pt also states she has crohn's disease so she can not consume any fiber or any fruit.     Plan:     As this pts complex case is out of the practicing scope of this dietitian, Ernestene Kiel RD, [facility] will contact this pt and work on some strategies with her to gain wt.

## 2014-08-13 ENCOUNTER — Other Ambulatory Visit: Payer: Self-pay | Admitting: Oncology

## 2014-08-16 ENCOUNTER — Ambulatory Visit (HOSPITAL_BASED_OUTPATIENT_CLINIC_OR_DEPARTMENT_OTHER): Payer: 59 | Admitting: Oncology

## 2014-08-16 ENCOUNTER — Ambulatory Visit (HOSPITAL_BASED_OUTPATIENT_CLINIC_OR_DEPARTMENT_OTHER): Payer: 59

## 2014-08-16 ENCOUNTER — Other Ambulatory Visit: Payer: 59

## 2014-08-16 ENCOUNTER — Other Ambulatory Visit: Payer: Self-pay | Admitting: *Deleted

## 2014-08-16 ENCOUNTER — Other Ambulatory Visit (HOSPITAL_BASED_OUTPATIENT_CLINIC_OR_DEPARTMENT_OTHER): Payer: 59

## 2014-08-16 ENCOUNTER — Ambulatory Visit: Payer: 59 | Admitting: Nutrition

## 2014-08-16 ENCOUNTER — Telehealth: Payer: Self-pay | Admitting: *Deleted

## 2014-08-16 ENCOUNTER — Telehealth: Payer: Self-pay | Admitting: Oncology

## 2014-08-16 VITALS — BP 140/74 | HR 82 | Resp 18 | Ht 67.0 in | Wt 110.7 lb

## 2014-08-16 DIAGNOSIS — Z5111 Encounter for antineoplastic chemotherapy: Secondary | ICD-10-CM

## 2014-08-16 DIAGNOSIS — C19 Malignant neoplasm of rectosigmoid junction: Secondary | ICD-10-CM | POA: Diagnosis not present

## 2014-08-16 DIAGNOSIS — D500 Iron deficiency anemia secondary to blood loss (chronic): Secondary | ICD-10-CM

## 2014-08-16 DIAGNOSIS — C189 Malignant neoplasm of colon, unspecified: Secondary | ICD-10-CM

## 2014-08-16 DIAGNOSIS — C787 Secondary malignant neoplasm of liver and intrahepatic bile duct: Secondary | ICD-10-CM | POA: Diagnosis not present

## 2014-08-16 DIAGNOSIS — R112 Nausea with vomiting, unspecified: Secondary | ICD-10-CM

## 2014-08-16 DIAGNOSIS — C786 Secondary malignant neoplasm of retroperitoneum and peritoneum: Secondary | ICD-10-CM | POA: Diagnosis not present

## 2014-08-16 LAB — COMPREHENSIVE METABOLIC PANEL (CC13)
ALBUMIN: 3.8 g/dL (ref 3.5–5.0)
ALT: 15 U/L (ref 0–55)
ANION GAP: 10 meq/L (ref 3–11)
AST: 21 U/L (ref 5–34)
Alkaline Phosphatase: 143 U/L (ref 40–150)
BILIRUBIN TOTAL: 0.28 mg/dL (ref 0.20–1.20)
BUN: 23.1 mg/dL (ref 7.0–26.0)
CO2: 28 mEq/L (ref 22–29)
Calcium: 9.5 mg/dL (ref 8.4–10.4)
Chloride: 102 mEq/L (ref 98–109)
Creatinine: 0.7 mg/dL (ref 0.6–1.1)
EGFR: 90 mL/min/{1.73_m2} — AB (ref >90)
GLUCOSE: 92 mg/dL (ref 70–140)
POTASSIUM: 4.2 meq/L (ref 3.5–5.1)
Sodium: 140 mEq/L (ref 136–145)
TOTAL PROTEIN: 7.5 g/dL (ref 6.4–8.3)

## 2014-08-16 LAB — CBC WITH DIFFERENTIAL/PLATELET
BASO%: 0.7 % (ref 0.0–2.0)
BASOS ABS: 0 10*3/uL (ref 0.0–0.1)
EOS ABS: 0.2 10*3/uL (ref 0.0–0.5)
EOS%: 2.9 % (ref 0.0–7.0)
HEMATOCRIT: 30.9 % — AB (ref 34.8–46.6)
HEMOGLOBIN: 9.6 g/dL — AB (ref 11.6–15.9)
LYMPH#: 1.2 10*3/uL (ref 0.9–3.3)
LYMPH%: 21.6 % (ref 14.0–49.7)
MCH: 26.6 pg (ref 25.1–34.0)
MCHC: 31.1 g/dL — ABNORMAL LOW (ref 31.5–36.0)
MCV: 85.6 fL (ref 79.5–101.0)
MONO#: 0.9 10*3/uL (ref 0.1–0.9)
MONO%: 15.6 % — ABNORMAL HIGH (ref 0.0–14.0)
NEUT%: 59.2 % (ref 38.4–76.8)
NEUTROS ABS: 3.3 10*3/uL (ref 1.5–6.5)
Platelets: 167 10*3/uL (ref 145–400)
RBC: 3.61 10*6/uL — ABNORMAL LOW (ref 3.70–5.45)
RDW: 16.1 % — ABNORMAL HIGH (ref 11.2–14.5)
WBC: 5.5 10*3/uL (ref 3.9–10.3)

## 2014-08-16 MED ORDER — PALONOSETRON HCL INJECTION 0.25 MG/5ML
INTRAVENOUS | Status: AC
  Filled 2014-08-16: qty 5

## 2014-08-16 MED ORDER — OXALIPLATIN CHEMO INJECTION 100 MG/20ML
85.0000 mg/m2 | Freq: Once | INTRAVENOUS | Status: AC
  Administered 2014-08-16: 135 mg via INTRAVENOUS
  Filled 2014-08-16: qty 27

## 2014-08-16 MED ORDER — FLUOROURACIL CHEMO INJECTION 2.5 GM/50ML
400.0000 mg/m2 | Freq: Once | INTRAVENOUS | Status: AC
  Administered 2014-08-16: 650 mg via INTRAVENOUS
  Filled 2014-08-16: qty 13

## 2014-08-16 MED ORDER — SODIUM CHLORIDE 0.9 % IV SOLN
2400.0000 mg/m2 | INTRAVENOUS | Status: DC
  Administered 2014-08-16: 3850 mg via INTRAVENOUS
  Filled 2014-08-16: qty 77

## 2014-08-16 MED ORDER — DEXAMETHASONE 4 MG PO TABS: 4.0000 mg | ORAL_TABLET | Freq: Two times a day (BID) | ORAL | Status: DC

## 2014-08-16 MED ORDER — DEXTROSE 5 % IV SOLN
Freq: Once | INTRAVENOUS | Status: AC
  Administered 2014-08-16: 12:00:00 via INTRAVENOUS

## 2014-08-16 MED ORDER — SODIUM CHLORIDE 0.9 % IV SOLN
Freq: Once | INTRAVENOUS | Status: AC
  Administered 2014-08-16: 12:00:00 via INTRAVENOUS
  Filled 2014-08-16: qty 5

## 2014-08-16 MED ORDER — PALONOSETRON HCL INJECTION 0.25 MG/5ML
0.2500 mg | Freq: Once | INTRAVENOUS | Status: AC
  Administered 2014-08-16: 0.25 mg via INTRAVENOUS

## 2014-08-16 MED ORDER — DEXTROSE 5 % IV SOLN
400.0000 mg/m2 | Freq: Once | INTRAVENOUS | Status: AC
  Administered 2014-08-16: 640 mg via INTRAVENOUS
  Filled 2014-08-16: qty 32

## 2014-08-16 NOTE — Progress Notes (Signed)
Patient was identified to be at risk for malnutrition on the MST secondary to weight loss and poor appetite. Patient is a 59 year old female diagnosed with rectal cancer.  She is a patient of Dr. Benay Spice.  Past medical history includes Crohn's disease.  Medications include Xanax, vitamin B12, Ativan.  Labs were reviewed.  Height: 67 inches. Weight: 110.7 pounds. Usual body weight: 135 pounds in February 2016. BMI: 17.33.  Patient is experiencing extreme nausea and diarrhea after chemotherapy. Physician has adjusted medications.  Patient says this continues to be a problem. Patient's diet consists of meat and starches.  She does not consume any vegetables or any fruit secondary to intolerance secondary to Crohn's. Patient can tolerate Ensure and will drink 2 ensure and two Ensure Plus daily.  Patient meets criteria for severe malnutrition in the context of chronic illness secondary to 18% weight loss in 3 months and less than 75% energy intake for greater than one month.  Nutrition diagnosis: Unintended weight loss related to rectal cancer and associated treatments as evidenced by 25 pound weight loss.  Intervention: Educated patient to continue small frequent meals and snacks. Recommended patient switched to Ensure Plus 4 times a day or Ensure Enlive. Samples were provided along with coupons. Educated patient on strategies for improving diarrhea.  Fact sheet was provided. Recommended patient follow strategies for eating with nausea and vomiting. Questions were answered.  Teach back method used.  Contact information was given.  Monitoring, evaluation, goals: Patient will tolerate increased calories and protein to minimize further weight loss.    Next visit: To be scheduled as needed.  **Disclaimer: This note was dictated with voice recognition software. Similar sounding words can inadvertently be transcribed and this note may contain transcription errors which may not have been  corrected upon publication of note.**

## 2014-08-16 NOTE — Progress Notes (Addendum)
  Sydney Morrison OFFICE PROGRESS NOTE   Diagnosis: Rectal cancer  INTERVAL HISTORY:   Sydney Morrison returns as scheduled. She completed cycle 2 FOLFOX 08/02/2014. She developed nausea and vomiting beginning on day 3. This lasted for several days. She also had diarrhea beginning on day 3. She did not take Imodium. She reports soreness in the mouth following chemotherapy. No rectal pain. She reports numbness at the index finger bilaterally. She had "neuropathy "predating chemotherapy.   Objective:  Vital signs in last 24 hours:  Blood pressure 140/74, pulse 82, resp. rate 18, height $RemoveBe'5\' 7"'rgebiZlPj$  (1.702 m), weight 110 lb 11.2 oz (50.213 kg), SpO2 100 %.    HEENT: No thrush or ulcers Resp: Lungs clear bilaterally Cardio: Regular rate and rhythm GI: The liver edge is palpable in the medial right subcostal region Vascular: No leg edema Neuro: Mild decrease in vibratory sense at the fingertips bilaterally    Portacath/PICC-without erythema  Lab Results:  Lab Results  Component Value Date   WBC 5.5 08/16/2014   HGB 9.6* 08/16/2014   HCT 30.9* 08/16/2014   MCV 85.6 08/16/2014   PLT 167 08/16/2014   NEUTROABS 3.3 08/16/2014   Potassium 4.2, creatinine 0.7  Lab Results  Component Value Date   CEA 54.7* 06/29/2014    Medications: I have reviewed the patient's current medications.  Assessment/Plan: 1. Rectal cancer  Obstructing mass at 10 cm from the anal verge noted on a colonoscopy 06/29/2014  CT chest, abdomen and pelvis 06/29/2014 revealed a partially obstructing rectosigmoid mass, widespread peritoneal metastatic disease in the pelvis, and diffuse liver metastases  Biopsy of a liver lesion 07/05/2014 confirmed metastatic adenocarcinoma,KRAS Q61H mutation   Cycle 1 FOLFOX 07/19/2014  Cycle 2 FOLFOX 08/02/2014  Cycle 3 FOLFOX 08/16/2014  2. Status post placement of a rectal stent on 06/30/2014, placed on bowel rest and IV antibiotics secondary to a possible  microperforation  3. Crohn's disease  4. Anemia secondary to rectal bleeding and multiple blood draws  5. Anorexia/weight loss  6. Fever-tumor fever?, Microperforation related to the rectal stent?-Improved  7. Status post Port-A-Cath placement 07/04/2014  8. Left pneumothorax following the Port-A-Cath placement, status post chest tube placement 07/05/2014. Chest tube removed 07/08/2014.  9. Acute onset pain right upper abdomen 07/23/2014 status post emergency room evaluation. Likely secondary to liver metastases. Improved.  10. Delayed nausea following cycle 1 FOLFOX. Aloxi and Emend added with cycle 2. Prophylactic Decadron added with cycle 3   Disposition:  She has completed 2 cycles of FOLFOX. Chemotherapy has been completed by delayed nausea and diarrhea. I encouraged her to use Imodium for diarrhea and contact us if this does not help. She will begin a trial of prophylactic Decadron for the delayed nausea. She will use Ativan as needed. She reports having an adverse reaction to redness own approximate 30 years ago with vision changes.  Ms. Sawhney will return for an office visit and cycle 4 FOLFOX in 2 weeks. We will check a CEA when she returns in 2 weeks. She will be scheduled for a restaging CT after cycle 5.  Betsy Coder, MD  08/16/2014  12:00 PM

## 2014-08-16 NOTE — Telephone Encounter (Signed)
Pt confirmed labs/ov per 05/18 POF, gave pt AVS and Calendar....  KJ, sent to add chemo

## 2014-08-16 NOTE — Patient Instructions (Signed)
[facility] Discharge Instructions for Patients Receiving Chemotherapy  Today you received the following chemotherapy agents: Oxaliplatin, Leucovorin, 5FU   To help prevent nausea and vomiting after your treatment, we encourage you to take your nausea medication as prescribed.  You may begin taking zofran for nausea as needed on Day 3 after chemo You may take Ativan and Compazine as needed for nausea beginning now; try to alternate these medications as taking them together may increase drowsiness.    If you develop nausea and vomiting that is not controlled by your nausea medication, call the clinic.   BELOW ARE SYMPTOMS THAT SHOULD BE REPORTED IMMEDIATELY:  *FEVER GREATER THAN 100.5 F  *CHILLS WITH OR WITHOUT FEVER  NAUSEA AND VOMITING THAT IS NOT CONTROLLED WITH YOUR NAUSEA MEDICATION  *UNUSUAL SHORTNESS OF BREATH  *UNUSUAL BRUISING OR BLEEDING  TENDERNESS IN MOUTH AND THROAT WITH OR WITHOUT PRESENCE OF ULCERS  *URINARY PROBLEMS  *BOWEL PROBLEMS  UNUSUAL RASH Items with * indicate a potential emergency and should be followed up as soon as possible.  Feel free to call the clinic you have any questions or concerns. The clinic phone number is [phone].  Please show the [facility] at check-in to the Emergency Department and triage nurse.

## 2014-08-16 NOTE — Telephone Encounter (Signed)
Per staff message and POF I have scheduled appts. Advised scheduler of appts. JMW  

## 2014-08-17 ENCOUNTER — Encounter: Payer: Self-pay | Admitting: Oncology

## 2014-08-17 ENCOUNTER — Encounter (HOSPITAL_COMMUNITY): Payer: Self-pay

## 2014-08-18 ENCOUNTER — Ambulatory Visit (HOSPITAL_BASED_OUTPATIENT_CLINIC_OR_DEPARTMENT_OTHER): Payer: 59

## 2014-08-18 ENCOUNTER — Encounter: Payer: Self-pay | Admitting: *Deleted

## 2014-08-18 VITALS — BP 110/65 | HR 65 | Temp 98.4°F

## 2014-08-18 DIAGNOSIS — C19 Malignant neoplasm of rectosigmoid junction: Secondary | ICD-10-CM

## 2014-08-18 DIAGNOSIS — Z452 Encounter for adjustment and management of vascular access device: Secondary | ICD-10-CM | POA: Diagnosis not present

## 2014-08-18 DIAGNOSIS — C189 Malignant neoplasm of colon, unspecified: Secondary | ICD-10-CM

## 2014-08-18 MED ORDER — HEPARIN SOD (PORK) LOCK FLUSH 100 UNIT/ML IV SOLN
500.0000 [IU] | Freq: Once | INTRAVENOUS | Status: AC | PRN
  Administered 2014-08-18: 500 [IU]
  Filled 2014-08-18: qty 5

## 2014-08-18 MED ORDER — SODIUM CHLORIDE 0.9 % IJ SOLN
10.0000 mL | INTRAMUSCULAR | Status: DC | PRN
  Administered 2014-08-18: 10 mL
  Filled 2014-08-18: qty 10

## 2014-08-18 NOTE — Patient Instructions (Signed)
Fluorouracil, 5-FU injection What is this medicine? FLUOROURACIL, 5-FU (flure oh YOOR a sil) is a chemotherapy drug. It slows the growth of cancer cells. This medicine is used to treat many types of cancer like breast cancer, colon or rectal cancer, pancreatic cancer, and stomach cancer. This medicine may be used for other purposes; ask your health care provider or pharmacist if you have questions. COMMON BRAND NAME(S): Adrucil What should I tell my health care provider before I take this medicine? They need to know if you have any of these conditions: -blood disorders -dihydropyrimidine dehydrogenase (DPD) deficiency -infection (especially a virus infection such as chickenpox, cold sores, or herpes) -kidney disease -liver disease -malnourished, poor nutrition -recent or ongoing radiation therapy -an unusual or allergic reaction to fluorouracil, other chemotherapy, other medicines, foods, dyes, or preservatives -pregnant or trying to get pregnant -breast-feeding How should I use this medicine? This drug is given as an infusion or injection into a vein. It is administered in a hospital or clinic by a specially trained health care professional. Talk to your pediatrician regarding the use of this medicine in children. Special care may be needed. Overdosage: If you think you have taken too much of this medicine contact a poison control center or emergency room at once. NOTE: This medicine is only for you. Do not share this medicine with others. What if I miss a dose? It is important not to miss your dose. Call your doctor or health care professional if you are unable to keep an appointment. What may interact with this medicine? -allopurinol -cimetidine -dapsone -digoxin -hydroxyurea -leucovorin -levamisole -medicines for seizures like ethotoin, fosphenytoin, phenytoin -medicines to increase blood counts like filgrastim, pegfilgrastim, sargramostim -medicines that treat or prevent blood  clots like warfarin, enoxaparin, and dalteparin -methotrexate -metronidazole -pyrimethamine -some other chemotherapy drugs like busulfan, cisplatin, estramustine, vinblastine -trimethoprim -trimetrexate -vaccines Talk to your doctor or health care professional before taking any of these medicines: -acetaminophen -aspirin -ibuprofen -ketoprofen -naproxen This list may not describe all possible interactions. Give your health care provider a list of all the medicines, herbs, non-prescription drugs, or dietary supplements you use. Also tell them if you smoke, drink alcohol, or use illegal drugs. Some items may interact with your medicine. What should I watch for while using this medicine? Visit your doctor for checks on your progress. This drug may make you feel generally unwell. This is not uncommon, as chemotherapy can affect healthy cells as well as cancer cells. Report any side effects. Continue your course of treatment even though you feel ill unless your doctor tells you to stop. In some cases, you may be given additional medicines to help with side effects. Follow all directions for their use. Call your doctor or health care professional for advice if you get a fever, chills or sore throat, or other symptoms of a cold or flu. Do not treat yourself. This drug decreases your body's ability to fight infections. Try to avoid being around people who are sick. This medicine may increase your risk to bruise or bleed. Call your doctor or health care professional if you notice any unusual bleeding. Be careful brushing and flossing your teeth or using a toothpick because you may get an infection or bleed more easily. If you have any dental work done, tell your dentist you are receiving this medicine. Avoid taking products that contain aspirin, acetaminophen, ibuprofen, naproxen, or ketoprofen unless instructed by your doctor. These medicines may hide a fever. Do not become pregnant while taking this    medicine. Women should inform their doctor if they wish to become pregnant or think they might be pregnant. There is a potential for serious side effects to an unborn child. Talk to your health care professional or pharmacist for more information. Do not breast-feed an infant while taking this medicine. Men should inform their doctor if they wish to father a child. This medicine may lower sperm counts. Do not treat diarrhea with over the counter products. Contact your doctor if you have diarrhea that lasts more than 2 days or if it is severe and watery. This medicine can make you more sensitive to the sun. Keep out of the sun. If you cannot avoid being in the sun, wear protective clothing and use sunscreen. Do not use sun lamps or tanning beds/booths. What side effects may I notice from receiving this medicine? Side effects that you should report to your doctor or health care professional as soon as possible: -allergic reactions like skin rash, itching or hives, swelling of the face, lips, or tongue -low blood counts - this medicine may decrease the number of white blood cells, red blood cells and platelets. You may be at increased risk for infections and bleeding. -signs of infection - fever or chills, cough, sore throat, pain or difficulty passing urine -signs of decreased platelets or bleeding - bruising, pinpoint red spots on the skin, black, tarry stools, blood in the urine -signs of decreased red blood cells - unusually weak or tired, fainting spells, lightheadedness -breathing problems -changes in vision -chest pain -mouth sores -nausea and vomiting -pain, swelling, redness at site where injected -pain, tingling, numbness in the hands or feet -redness, swelling, or sores on hands or feet -stomach pain -unusual bleeding Side effects that usually do not require medical attention (report to your doctor or health care professional if they continue or are bothersome): -changes in finger or  toe nails -diarrhea -dry or itchy skin -hair loss -headache -loss of appetite -sensitivity of eyes to the light -stomach upset -unusually teary eyes This list may not describe all possible side effects. Call your doctor for medical advice about side effects. You may report side effects to FDA at 1-800-FDA-1088. Where should I keep my medicine? This drug is given in a hospital or clinic and will not be stored at home. NOTE: This sheet is a summary. It may not cover all possible information. If you have questions about this medicine, talk to your doctor, pharmacist, or health care provider.  2015, Elsevier/Gold Standard. (2007-07-21 13:53:16)   

## 2014-08-18 NOTE — [facility] Oncology Navigator Note (Signed)
Dr. Benay Spice requesting Foundation I testing on this case:   Patient: Sydney Morrison, Sydney Morrison Collected: 07/05/2014 Client: [facility] Accession: [id number] Received: 07/05/2014 Sydney Morrison DOB: 1956/02/19 Age: 59 Gender: F Reported: 07/06/2014 [address] Patient Ph: [phone] MRN #: [id number] [place], [place] [address]  Pathology notified.

## 2014-08-21 ENCOUNTER — Other Ambulatory Visit: Payer: Self-pay | Admitting: *Deleted

## 2014-08-21 MED ORDER — LORAZEPAM 0.5 MG PO TABS: 0.5000 mg | ORAL_TABLET | Freq: Four times a day (QID) | ORAL | Status: DC | PRN

## 2014-08-21 NOTE — Telephone Encounter (Signed)
Ativan refill called in to pt's preferred pharmacy; Pt made aware and appreciated call.

## 2014-08-24 ENCOUNTER — Ambulatory Visit (HOSPITAL_BASED_OUTPATIENT_CLINIC_OR_DEPARTMENT_OTHER): Payer: 59 | Admitting: Nurse Practitioner

## 2014-08-24 ENCOUNTER — Encounter (HOSPITAL_COMMUNITY): Payer: Self-pay

## 2014-08-24 ENCOUNTER — Encounter: Payer: Self-pay | Admitting: Nurse Practitioner

## 2014-08-24 ENCOUNTER — Emergency Department (HOSPITAL_COMMUNITY): Payer: 59

## 2014-08-24 ENCOUNTER — Ambulatory Visit (HOSPITAL_BASED_OUTPATIENT_CLINIC_OR_DEPARTMENT_OTHER): Payer: 59

## 2014-08-24 ENCOUNTER — Emergency Department (HOSPITAL_COMMUNITY)
Admission: EM | Admit: 2014-08-24 | Discharge: 2014-08-24 | Disposition: A | Discharge status: Home / Self Care | Payer: 59 | Source: Home / Self Care | Attending: Emergency Medicine | Admitting: Emergency Medicine

## 2014-08-24 ENCOUNTER — Telehealth: Payer: Self-pay | Admitting: *Deleted

## 2014-08-24 VITALS — BP 135/70 | HR 81 | Temp 98.4°F | Resp 16 | Wt 109.8 lb

## 2014-08-24 VITALS — BP 133/63 | HR 74 | Temp 98.3°F | Resp 18

## 2014-08-24 DIAGNOSIS — Z888 Allergy status to other drugs, medicaments and biological substances status: Secondary | ICD-10-CM

## 2014-08-24 DIAGNOSIS — Z87891 Personal history of nicotine dependence: Secondary | ICD-10-CM

## 2014-08-24 DIAGNOSIS — Z79899 Other long term (current) drug therapy: Secondary | ICD-10-CM | POA: Insufficient documentation

## 2014-08-24 DIAGNOSIS — K913 Postprocedural intestinal obstruction: Secondary | ICD-10-CM | POA: Diagnosis not present

## 2014-08-24 DIAGNOSIS — M6281 Muscle weakness (generalized): Secondary | ICD-10-CM

## 2014-08-24 DIAGNOSIS — G8929 Other chronic pain: Secondary | ICD-10-CM | POA: Insufficient documentation

## 2014-08-24 DIAGNOSIS — R197 Diarrhea, unspecified: Secondary | ICD-10-CM

## 2014-08-24 DIAGNOSIS — E43 Unspecified severe protein-calorie malnutrition: Secondary | ICD-10-CM | POA: Diagnosis present

## 2014-08-24 DIAGNOSIS — C19 Malignant neoplasm of rectosigmoid junction: Secondary | ICD-10-CM

## 2014-08-24 DIAGNOSIS — D6959 Other secondary thrombocytopenia: Secondary | ICD-10-CM | POA: Diagnosis present

## 2014-08-24 DIAGNOSIS — R51 Headache: Secondary | ICD-10-CM | POA: Diagnosis present

## 2014-08-24 DIAGNOSIS — Z9889 Other specified postprocedural states: Secondary | ICD-10-CM

## 2014-08-24 DIAGNOSIS — I9581 Postprocedural hypotension: Secondary | ICD-10-CM | POA: Diagnosis not present

## 2014-08-24 DIAGNOSIS — C787 Secondary malignant neoplasm of liver and intrahepatic bile duct: Secondary | ICD-10-CM | POA: Diagnosis present

## 2014-08-24 DIAGNOSIS — R109 Unspecified abdominal pain: Secondary | ICD-10-CM

## 2014-08-24 DIAGNOSIS — R627 Adult failure to thrive: Secondary | ICD-10-CM | POA: Diagnosis present

## 2014-08-24 DIAGNOSIS — Z9221 Personal history of antineoplastic chemotherapy: Secondary | ICD-10-CM

## 2014-08-24 DIAGNOSIS — C784 Secondary malignant neoplasm of small intestine: Secondary | ICD-10-CM | POA: Diagnosis present

## 2014-08-24 DIAGNOSIS — E860 Dehydration: Secondary | ICD-10-CM | POA: Diagnosis present

## 2014-08-24 DIAGNOSIS — D012 Carcinoma in situ of rectum: Secondary | ICD-10-CM | POA: Insufficient documentation

## 2014-08-24 DIAGNOSIS — K5090 Crohn's disease, unspecified, without complications: Secondary | ICD-10-CM | POA: Diagnosis present

## 2014-08-24 DIAGNOSIS — E871 Hypo-osmolality and hyponatremia: Secondary | ICD-10-CM | POA: Diagnosis not present

## 2014-08-24 DIAGNOSIS — C189 Malignant neoplasm of colon, unspecified: Secondary | ICD-10-CM

## 2014-08-24 DIAGNOSIS — A419 Sepsis, unspecified organism: Secondary | ICD-10-CM | POA: Diagnosis not present

## 2014-08-24 DIAGNOSIS — G620 Drug-induced polyneuropathy: Secondary | ICD-10-CM

## 2014-08-24 DIAGNOSIS — Z8719 Personal history of other diseases of the digestive system: Secondary | ICD-10-CM | POA: Insufficient documentation

## 2014-08-24 DIAGNOSIS — D500 Iron deficiency anemia secondary to blood loss (chronic): Secondary | ICD-10-CM | POA: Diagnosis present

## 2014-08-24 DIAGNOSIS — J189 Pneumonia, unspecified organism: Secondary | ICD-10-CM | POA: Diagnosis not present

## 2014-08-24 DIAGNOSIS — T451X5A Adverse effect of antineoplastic and immunosuppressive drugs, initial encounter: Secondary | ICD-10-CM | POA: Diagnosis present

## 2014-08-24 DIAGNOSIS — Z880 Allergy status to penicillin: Secondary | ICD-10-CM

## 2014-08-24 DIAGNOSIS — D6481 Anemia due to antineoplastic chemotherapy: Secondary | ICD-10-CM | POA: Diagnosis not present

## 2014-08-24 DIAGNOSIS — R112 Nausea with vomiting, unspecified: Secondary | ICD-10-CM

## 2014-08-24 DIAGNOSIS — Z79891 Long term (current) use of opiate analgesic: Secondary | ICD-10-CM

## 2014-08-24 DIAGNOSIS — R5082 Postprocedural fever: Secondary | ICD-10-CM | POA: Diagnosis not present

## 2014-08-24 DIAGNOSIS — Z681 Body mass index (BMI) 19 or less, adult: Secondary | ICD-10-CM

## 2014-08-24 DIAGNOSIS — E876 Hypokalemia: Secondary | ICD-10-CM | POA: Diagnosis not present

## 2014-08-24 DIAGNOSIS — Z885 Allergy status to narcotic agent status: Secondary | ICD-10-CM

## 2014-08-24 DIAGNOSIS — K5900 Constipation, unspecified: Secondary | ICD-10-CM | POA: Diagnosis present

## 2014-08-24 DIAGNOSIS — R10817 Generalized abdominal tenderness: Secondary | ICD-10-CM

## 2014-08-24 DIAGNOSIS — Z515 Encounter for palliative care: Secondary | ICD-10-CM

## 2014-08-24 DIAGNOSIS — I4892 Unspecified atrial flutter: Secondary | ICD-10-CM | POA: Diagnosis not present

## 2014-08-24 DIAGNOSIS — Z881 Allergy status to other antibiotic agents status: Secondary | ICD-10-CM

## 2014-08-24 DIAGNOSIS — C786 Secondary malignant neoplasm of retroperitoneum and peritoneum: Secondary | ICD-10-CM | POA: Diagnosis present

## 2014-08-24 DIAGNOSIS — L89150 Pressure ulcer of sacral region, unstageable: Secondary | ICD-10-CM | POA: Diagnosis not present

## 2014-08-24 LAB — URINALYSIS, ROUTINE W REFLEX MICROSCOPIC
Bilirubin Urine: NEGATIVE
GLUCOSE, UA: NEGATIVE mg/dL
Hgb urine dipstick: NEGATIVE
KETONES UR: NEGATIVE mg/dL
Leukocytes, UA: NEGATIVE
Nitrite: NEGATIVE
Specific Gravity, Urine: >1.03 — ABNORMAL HIGH (ref 1.005–1.030)
Urobilinogen, UA: 0.2 mg/dL (ref 0.0–1.0)
pH: 6 (ref 5.0–8.0)

## 2014-08-24 LAB — CBC WITH DIFFERENTIAL/PLATELET
BASOS ABS: 0 10*3/uL (ref 0.0–0.1)
BASOS PCT: 1 % (ref 0–1)
Eosinophils Absolute: 0.4 10*3/uL (ref 0.0–0.7)
Eosinophils Relative: 6 % — ABNORMAL HIGH (ref 0–5)
HCT: 30.6 % — ABNORMAL LOW (ref 36.0–46.0)
Hemoglobin: 9.7 g/dL — ABNORMAL LOW (ref 12.0–15.0)
Lymphocytes Relative: 29 % (ref 12–46)
Lymphs Abs: 1.8 10*3/uL (ref 0.7–4.0)
MCH: 26.6 pg (ref 26.0–34.0)
MCHC: 31.7 g/dL (ref 30.0–36.0)
MCV: 83.8 fL (ref 78.0–100.0)
MONO ABS: 0.6 10*3/uL (ref 0.1–1.0)
MONOS PCT: 10 % (ref 3–12)
NEUTROS ABS: 3.3 10*3/uL (ref 1.7–7.7)
Neutrophils Relative %: 55 % (ref 43–77)
Platelets: 111 10*3/uL — ABNORMAL LOW (ref 150–400)
RBC: 3.65 MIL/uL — ABNORMAL LOW (ref 3.87–5.11)
RDW: 16.7 % — ABNORMAL HIGH (ref 11.5–15.5)
Smear Review: DECREASED
WBC: 6.1 10*3/uL (ref 4.0–10.5)

## 2014-08-24 LAB — COMPREHENSIVE METABOLIC PANEL
ALT: 15 U/L (ref 14–54)
AST: 23 U/L (ref 15–41)
Albumin: 4.2 g/dL (ref 3.5–5.0)
Alkaline Phosphatase: 124 U/L (ref 38–126)
Anion gap: 10 (ref 5–15)
BUN: 26 mg/dL — ABNORMAL HIGH (ref 6–20)
CALCIUM: 9.4 mg/dL (ref 8.9–10.3)
CO2: 26 mmol/L (ref 22–32)
Chloride: 100 mmol/L — ABNORMAL LOW (ref 101–111)
Creatinine, Ser: 0.58 mg/dL (ref 0.44–1.00)
GFR calc non Af Amer: >60 mL/min (ref >60)
GLUCOSE: 117 mg/dL — AB (ref 65–99)
POTASSIUM: 4 mmol/L (ref 3.5–5.1)
Sodium: 136 mmol/L (ref 135–145)
Total Bilirubin: 0.5 mg/dL (ref 0.3–1.2)
Total Protein: 7.8 g/dL (ref 6.5–8.1)

## 2014-08-24 LAB — URINE MICROSCOPIC-ADD ON

## 2014-08-24 LAB — LIPASE, BLOOD: LIPASE: 30 U/L (ref 22–51)

## 2014-08-24 MED ORDER — LORAZEPAM 2 MG/ML IJ SOLN
0.5000 mg | Freq: Once | INTRAMUSCULAR | Status: AC
  Administered 2014-08-24: 0.5 mg via INTRAVENOUS

## 2014-08-24 MED ORDER — METOCLOPRAMIDE HCL 5 MG/ML IJ SOLN
10.0000 mg | Freq: Once | INTRAMUSCULAR | Status: AC
  Administered 2014-08-24: 10 mg via INTRAVENOUS
  Filled 2014-08-24: qty 2

## 2014-08-24 MED ORDER — SODIUM CHLORIDE 0.9 % IV BOLUS (SEPSIS)
1000.0000 mL | Freq: Once | INTRAVENOUS | Status: AC
  Administered 2014-08-24: 1000 mL via INTRAVENOUS

## 2014-08-24 MED ORDER — LORAZEPAM 2 MG/ML IJ SOLN
INTRAMUSCULAR | Status: AC
  Filled 2014-08-24: qty 1

## 2014-08-24 MED ORDER — ONDANSETRON 4 MG PO TBDP: ORAL_TABLET | ORAL | Status: DC

## 2014-08-24 MED ORDER — DEXAMETHASONE SODIUM PHOSPHATE 100 MG/10ML IJ SOLN
Freq: Once | INTRAMUSCULAR | Status: AC
  Administered 2014-08-24: 16:00:00 via INTRAVENOUS
  Filled 2014-08-24: qty 4

## 2014-08-24 MED ORDER — SODIUM CHLORIDE 0.9 % IV SOLN
INTRAVENOUS | Status: AC
  Administered 2014-08-24: 16:00:00 via INTRAVENOUS

## 2014-08-24 MED ORDER — MORPHINE SULFATE 4 MG/ML IJ SOLN
4.0000 mg | Freq: Once | INTRAMUSCULAR | Status: AC
  Administered 2014-08-24: 4 mg via INTRAVENOUS
  Filled 2014-08-24: qty 1

## 2014-08-24 MED ORDER — ONDANSETRON HCL 4 MG/2ML IJ SOLN
4.0000 mg | Freq: Once | INTRAMUSCULAR | Status: AC
  Administered 2014-08-24: 4 mg via INTRAVENOUS
  Filled 2014-08-24: qty 2

## 2014-08-24 NOTE — Patient Instructions (Signed)
Dehydration, Adult Dehydration is when you lose more fluids from the body than you take in. Vital organs like the kidneys, brain, and heart cannot function without a proper amount of fluids and salt. Any loss of fluids from the body can cause dehydration.  CAUSES   Vomiting.  Diarrhea.  Excessive sweating.  Excessive urine output.  Fever. SYMPTOMS  Mild dehydration  Thirst.  Dry lips.  Slightly dry mouth. Moderate dehydration  Very dry mouth.  Sunken eyes.  Skin does not bounce back quickly when lightly pinched and released.  Dark urine and decreased urine production.  Decreased tear production.  Headache. Severe dehydration  Very dry mouth.  Extreme thirst.  Rapid, weak pulse (more than 100 beats per minute at rest).  Cold hands and feet.  Not able to sweat in spite of heat and temperature.  Rapid breathing.  Blue lips.  Confusion and lethargy.  Difficulty being awakened.  Minimal urine production.  No tears. DIAGNOSIS  Your caregiver will diagnose dehydration based on your symptoms and your exam. Blood and urine tests will help confirm the diagnosis. The diagnostic evaluation should also identify the cause of dehydration. TREATMENT  Treatment of mild or moderate dehydration can often be done at home by increasing the amount of fluids that you drink. It is best to drink small amounts of fluid more often. Drinking too much at one time can make vomiting worse. Refer to the home care instructions below. Severe dehydration needs to be treated at the hospital where you will probably be given intravenous (IV) fluids that contain water and electrolytes. HOME CARE INSTRUCTIONS   Ask your caregiver about specific rehydration instructions.  Drink enough fluids to keep your urine clear or pale yellow.  Drink small amounts frequently if you have nausea and vomiting.  Eat as you normally do.  Avoid:  Foods or drinks high in sugar.  Carbonated  drinks.  Juice.  Extremely hot or cold fluids.  Drinks with caffeine.  Fatty, greasy foods.  Alcohol.  Tobacco.  Overeating.  Gelatin desserts.  Wash your hands well to avoid spreading bacteria and viruses.  Only take over-the-counter or prescription medicines for pain, discomfort, or fever as directed by your caregiver.  Ask your caregiver if you should continue all prescribed and over-the-counter medicines.  Keep all follow-up appointments with your caregiver. SEEK MEDICAL CARE IF:  You have abdominal pain and it increases or stays in one area (localizes).  You have a rash, stiff neck, or severe headache.  You are irritable, sleepy, or difficult to awaken.  You are weak, dizzy, or extremely thirsty. SEEK IMMEDIATE MEDICAL CARE IF:   You are unable to keep fluids down or you get worse despite treatment.  You have frequent episodes of vomiting or diarrhea.  You have blood or green matter (bile) in your vomit.  You have blood in your stool or your stool looks black and tarry.  You have not urinated in 6 to 8 hours, or you have only urinated a small amount of very dark urine.  You have a fever.  You faint. MAKE SURE YOU:   Understand these instructions.  Will watch your condition.  Will get help right away if you are not doing well or get worse. Document Released: 03/17/2005 Document Revised: 06/09/2011 Document Reviewed: 11/04/2010 ExitCare Patient Information 2015 [employer]. This information is not intended to replace advice given to you by your health care provider. Make sure you discuss any questions you have with your health care   provider.  

## 2014-08-24 NOTE — Telephone Encounter (Signed)
Patient called stating that she is having severe nausea/vomiting with with difficulty eating/drinking. Patient recently went to ED due to nausea/vomiting but with no relief. MD Benay Spice and Selena Lesser NP notified. Patient will see Selena Lesser in symptom management. Patient notified and verbalized understanding. POF sent to scheduler.

## 2014-08-24 NOTE — Discharge Instructions (Signed)
Abdominal Pain °Many things can cause abdominal pain. Usually, abdominal pain is not caused by a disease and will improve without treatment. It can often be observed and treated at home. Your health care provider will do a physical exam and possibly order blood tests and X-rays to help determine the seriousness of your pain. However, in many cases, more time must pass before a clear cause of the pain can be found. Before that point, your health care provider may not know if you need more testing or further treatment. °HOME CARE INSTRUCTIONS  °Monitor your abdominal pain for any changes. The following actions may help to alleviate any discomfort you are experiencing: °· Only take over-the-counter or prescription medicines as directed by your health care provider. °· Do not take laxatives unless directed to do so by your health care provider. °· Try a clear liquid diet (broth, tea, or water) as directed by your health care provider. Slowly move to a bland diet as tolerated. °SEEK MEDICAL CARE IF: °· You have unexplained abdominal pain. °· You have abdominal pain associated with nausea or diarrhea. °· You have pain when you urinate or have a bowel movement. °· You experience abdominal pain that wakes you in the night. °· You have abdominal pain that is worsened or improved by eating food. °· You have abdominal pain that is worsened with eating fatty foods. °· You have a fever. °SEEK IMMEDIATE MEDICAL CARE IF:  °· Your pain does not go away within 2 hours. °· You keep throwing up (vomiting). °· Your pain is felt only in portions of the abdomen, such as the right side or the left lower portion of the abdomen. °· You pass bloody or black tarry stools. °MAKE SURE YOU: °· Understand these instructions.   °· Will watch your condition.   °· Will get help right away if you are not doing well or get worse.   °Document Released: 12/25/2004 Document Revised: 03/22/2013 Document Reviewed: 11/24/2012 °ExitCare® Patient Information  ©2015 [employer]. This information is not intended to replace advice given to you by your health care provider. Make sure you discuss any questions you have with your health care provider. ° °Nausea and Vomiting °Nausea is a sick feeling that often comes before throwing up (vomiting). Vomiting is a reflex where stomach contents come out of your mouth. Vomiting can cause severe loss of body fluids (dehydration). Children and elderly adults can become dehydrated quickly, especially if they also have diarrhea. Nausea and vomiting are symptoms of a condition or disease. It is important to find the cause of your symptoms. °CAUSES  °· Direct irritation of the stomach lining. This irritation can result from increased acid production (gastroesophageal reflux disease), infection, food poisoning, taking certain medicines (such as nonsteroidal anti-inflammatory drugs), alcohol use, or tobacco use. °· Signals from the brain. These signals could be caused by a headache, heat exposure, an inner ear disturbance, increased pressure in the brain from injury, infection, a tumor, or a concussion, pain, emotional stimulus, or metabolic problems. °· An obstruction in the gastrointestinal tract (bowel obstruction). °· Illnesses such as diabetes, hepatitis, gallbladder problems, appendicitis, kidney problems, cancer, sepsis, atypical symptoms of a heart attack, or eating disorders. °· Medical treatments such as chemotherapy and radiation. °· Receiving medicine that makes you sleep (general anesthetic) during surgery. °DIAGNOSIS °Your caregiver may ask for tests to be done if the problems do not improve after a few days. Tests may also be done if symptoms are severe or if the reason for the nausea   and vomiting is not clear. Tests may include: °· Urine tests. °· Blood tests. °· Stool tests. °· Cultures (to look for evidence of infection). °· X-rays or other imaging studies. °Test results can help your caregiver make decisions about  treatment or the need for additional tests. °TREATMENT °You need to stay well hydrated. Drink frequently but in small amounts. You may wish to drink water, sports drinks, clear broth, or eat frozen ice pops or gelatin dessert to help stay hydrated. When you eat, eating slowly may help prevent nausea. There are also some antinausea medicines that may help prevent nausea. °HOME CARE INSTRUCTIONS  °· Take all medicine as directed by your caregiver. °· If you do not have an appetite, do not force yourself to eat. However, you must continue to drink fluids. °· If you have an appetite, eat a normal diet unless your caregiver tells you differently. °¨ Eat a variety of complex carbohydrates (rice, wheat, potatoes, bread), lean meats, yogurt, fruits, and vegetables. °¨ Avoid high-fat foods because they are more difficult to digest. °· Drink enough water and fluids to keep your urine clear or pale yellow. °· If you are dehydrated, ask your caregiver for specific rehydration instructions. Signs of dehydration may include: °¨ Severe thirst. °¨ Dry lips and mouth. °¨ Dizziness. °¨ Dark urine. °¨ Decreasing urine frequency and amount. °¨ Confusion. °¨ Rapid breathing or pulse. °SEEK IMMEDIATE MEDICAL CARE IF:  °· You have blood or brown flecks (like coffee grounds) in your vomit. °· You have black or bloody stools. °· You have a severe headache or stiff neck. °· You are confused. °· You have severe abdominal pain. °· You have chest pain or trouble breathing. °· You do not urinate at least once every 8 hours. °· You develop cold or clammy skin. °· You continue to vomit for longer than 24 to 48 hours. °· You have a fever. °MAKE SURE YOU:  °· Understand these instructions. °· Will watch your condition. °· Will get help right away if you are not doing well or get worse. °Document Released: 03/17/2005 Document Revised: 06/09/2011 Document Reviewed: 08/14/2010 °ExitCare® Patient Information ©2015 [employer]. This information is not  intended to replace advice given to you by your health care provider. Make sure you discuss any questions you have with your health care provider. ° °

## 2014-08-24 NOTE — Assessment & Plan Note (Signed)
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  Will continue to monitor closely.

## 2014-08-24 NOTE — Assessment & Plan Note (Signed)
Patient received her last chemotherapy on 08/16/2014.  She is currently mildly anemic with a hemoglobin of 9.7.  Patient denies any worsening issues with either fatigue or shortness of breath with exertion.  Will continue to monitor closely.

## 2014-08-24 NOTE — ED Provider Notes (Signed)
CSN: [id number]     Arrival date & time 08/24/14  0445 History   First MD Initiated Contact with Patient 08/24/14 0455     Chief Complaint  Patient presents with  . Emesis     (Consider location/radiation/quality/duration/timing/severity/associated sxs/prior Treatment) HPI Comments: Patient presents to the ER for evaluation of vomiting, generalized weakness. Patient reports that she recently started chemotherapy for rectal cancer. She has received 3 treatments. Her last treatment was last week. Patient reports that she has had chronic diarrhea, but today developed nausea and vomiting. She called the hotline and was told to come to the ER for IV fluids. Patient reports moderate pain which is diffuse across her upper abdomen. No hematemesis, rectal bleeding or melana.  Patient is a 59 y.o. female presenting with vomiting.  Emesis Associated symptoms: abdominal pain and diarrhea     Past Medical History  Diagnosis Date  . Crohn's disease   . Chronic headache   . Cancer    Past Surgical History  Procedure Laterality Date  . Breast surgery    . Uterine fibroid surgery    . Colonoscopy w/ biopsies  06/29/2014    DR HUNG  . Flexible sigmoidoscopy N/A 06/30/2014    Procedure: FLEXIBLE SIGMOIDOSCOPY;  Surgeon: Carol Ada, MD;  Location: [facility] ENDOSCOPY;  Service: Endoscopy;  Laterality: N/A;  . Colonic stent placement N/A 06/30/2014    Procedure: COLONIC STENT PLACEMENT;  Surgeon: Carol Ada, MD;  Location: [facility] ENDOSCOPY;  Service: Endoscopy;  Laterality: N/A;  with fluro   . Portacath placement N/A 07/04/2014    Procedure: POWER PORT PLACEMENT;  Surgeon: Alphonsa Overall, MD;  Location: [place];  Service: General;  Laterality: N/A;   Family History  Problem Relation Age of Onset  . Hypertension Mother   . Cancer Father    History  Substance Use Topics  . Smoking status: Former Smoker -- 0.50 packs/day for 40 years    Types: Cigarettes    Quit date: 06/25/2014  . Smokeless tobacco: Never  Used  . Alcohol Use: No   OB History    Gravida Para Term Preterm AB TAB SAB Ectopic Multiple Living   3 2 2  1  1         Review of Systems  Gastrointestinal: Positive for nausea, vomiting, abdominal pain and diarrhea.  All other systems reviewed and are negative.     Allergies  Avelox; Codeine; Dextromethorphan; Erythromycin; Penicillins; and Suprep  Home Medications   Prior to Admission medications   Medication Sig Start Date End Date Taking? Authorizing Provider  acetaminophen (TYLENOL) 325 MG tablet Take 650 mg by mouth every 6 (six) hours as needed for mild pain or fever.    Historical Provider, MD  ALPRAZolam Duanne Moron) 0.5 MG tablet Take 0.5 mg by mouth at bedtime as needed for anxiety.    Historical Provider, MD  cromolyn (NASALCROM) 5.2 MG/ACT nasal spray Place 1 spray into both nostrils at bedtime.    Historical Provider, MD  cromolyn (NASALCROM) 5.2 MG/ACT nasal spray Place 1 spray into both nostrils at bedtime.    Historical Provider, MD  Cyanocobalamin (VITAMIN B-12 CR) 1500 MCG TBCR Take 1 tablet by mouth daily.    Historical Provider, MD  dexamethasone (DECADRON) 4 MG tablet Take 1 tablet (4 mg total) by mouth 2 (two) times daily. For 2 days. Begin day of pump disconnect. 08/16/14   Ladell Pier, MD  Fluorouracil (ADRUCIL IV) Inject into the vein every 14 (fourteen) days.    Historical  Provider, MD  leucovorin in dextrose 5 % 250 mL Inject into the vein every 14 (fourteen) days.    Historical Provider, MD  lidocaine-prilocaine (EMLA) cream Apply 1 application topically as needed. Apply to portacath site 1-2 hours prior to use 07/14/14   Owens Shark, NP  LORazepam (ATIVAN) 0.5 MG tablet Take 1 tablet (0.5 mg total) by mouth every 6 (six) hours as needed for anxiety. 08/21/14   Ladell Pier, MD  meclizine (ANTIVERT) 25 MG tablet Take 1 tablet (25 mg total) by mouth 3 (three) times daily as needed for dizziness. Patient not taking: Reported on 07/14/2014 05/31/14    Francine Graven, DO  nicotine (NICODERM CQ - DOSED IN MG/24 HOURS) 14 mg/24hr patch Place 14 mg onto the skin daily.    Historical Provider, MD  oxaliplatin in dextrose 5 % 500 mL Inject into the vein every 14 (fourteen) days.    Historical Provider, MD  traMADol (ULTRAM) 50 MG tablet Take 1 tablet (50 mg total) by mouth every 6 (six) hours as needed. Patient not taking: Reported on 08/02/2014 07/23/14   Nat Christen, MD  zolpidem (AMBIEN) 5 MG tablet Take 1 tablet (5 mg total) by mouth at bedtime as needed for sleep. 07/14/14   Owens Shark, NP   BP 139/76 mmHg  Pulse 74  Temp(Src) 98.7 F (37.1 C) (Oral)  Resp 20  Ht 5\' 7"  (1.702 m)  Wt 110 lb (49.896 kg)  BMI 17.22 kg/m2  SpO2 100% Physical Exam  Constitutional: She is oriented to person, place, and time. She appears well-developed and well-nourished. No distress.  HENT:  Head: Normocephalic and atraumatic.  Right Ear: Hearing normal.  Left Ear: Hearing normal.  Nose: Nose normal.  Mouth/Throat: Oropharynx is clear and moist and mucous membranes are normal.  Eyes: Conjunctivae and EOM are normal. Pupils are equal, round, and reactive to light.  Neck: Normal range of motion. Neck supple.  Cardiovascular: Regular rhythm, S1 normal and S2 normal.  Exam reveals no gallop and no friction rub.   No murmur heard. Pulmonary/Chest: Effort normal and breath sounds normal. No respiratory distress. She exhibits no tenderness.  Abdominal: Soft. Normal appearance and bowel sounds are normal. There is no hepatosplenomegaly. There is generalized tenderness. There is no rebound, no guarding, no tenderness at McBurney's point and negative Murphy's sign. No hernia.  Musculoskeletal: Normal range of motion.  Neurological: She is alert and oriented to person, place, and time. She has normal strength. No cranial nerve deficit or sensory deficit. Coordination normal. GCS eye subscore is 4. GCS verbal subscore is 5. GCS motor subscore is 6.  Skin: Skin is  warm, dry and intact. No rash noted. No cyanosis.  Psychiatric: She has a normal mood and affect. Her speech is normal and behavior is normal. Thought content normal.  Nursing note and vitals reviewed.   ED Course  Procedures (including critical care time) Labs Review Labs Reviewed  CBC WITH DIFFERENTIAL/PLATELET - Abnormal; Notable for the following:    RBC 3.65 (*)    Hemoglobin 9.7 (*)    HCT 30.6 (*)    RDW 16.7 (*)    Platelets 111 (*)    Eosinophils Relative 6 (*)    All other components within normal limits  COMPREHENSIVE METABOLIC PANEL - Abnormal; Notable for the following:    Chloride 100 (*)    Glucose, Bld 117 (*)    BUN 26 (*)    All other components within normal limits  URINALYSIS,  ROUTINE W REFLEX MICROSCOPIC (NOT AT [facility]) - Abnormal; Notable for the following:    Specific Gravity, Urine >1.030 (*)    Protein, ur TRACE (*)    All other components within normal limits  LIPASE, BLOOD  URINE MICROSCOPIC-ADD ON  I-STAT CG4 LACTIC ACID, ED    Imaging Review Dg Abd Acute W/chest  08/24/2014   CLINICAL DATA:  Chemotherapy for colon cancer. Dehydration. Diffuse pain.  EXAM: DG ABDOMEN ACUTE W/ 1V CHEST  COMPARISON:  Abdominal CT 07/23/2014  FINDINGS: Chronic retention of stool above a sigmoid colon metallic stent. No evidence of high-grade obstruction or change from previous. No concerning intra-abdominal mass effect or calcification. Lung bases are clear.  IMPRESSION: Chronic retention of stool above of the patient's sigmoid colon stent. The stool appears desiccated and is likely resistant to passing through the stent. No high-grade obstruction or progression since 07/23/2014.   Electronically Signed   By: Monte Fantasia M.D.   On: 08/24/2014 06:51     EKG Interpretation None      MDM   Final diagnoses:  Abdominal pain, acute  Nausea and vomiting, vomiting of unspecified type    Patient presents to the emergency department for evaluation of nausea and  vomiting. Patient feels weak and dehydrated. Patient has a history of rectal cancer. She has a colonic stent. Patient reports that she began having nausea and vomiting overnight. She had 4 out of 10 abdominal pain. Examination, however, was benign. No guarding or rebound. Patient's pain is completely resolved with a single dose of morphine. Her lab work is unremarkable. X-ray does not show any evidence of obstruction. Patient was hydrated with saline. She was given additional antiemetics with progressive improvement. She'll be discharged, symptomatic treatment and follow-up with oncology and primary care. Return if symptoms worsen.    Orpah Greek, MD 08/24/14 [phone]

## 2014-08-24 NOTE — Assessment & Plan Note (Signed)
Patient continues to complain of chronic nausea; and dry heaving.  She presented to [facility] emergency department earlier today at approximate 4 AM with same complaints.  She received Zofran, morphine for pain, and IV fluid rehydration at that time.  On exam.-Patient continues to be nauseous; but is not vomiting.  Patient received Zofran and Ativan IV while at the [place] today.  Patient's nausea appears to be much better under control.  Patient was advised to continue with a clear liquid diet until she is able to manage a bland diet.  Confirmed that patient has both Zofran and Ativan to take at home for nausea.  The plan is for the patient to call first thing in the morning if she continues with nausea, dry heaving, and dehydration.  May very well need to consider admission for failed outpatient therapy.

## 2014-08-24 NOTE — ED Notes (Signed)
Pt receiving chemo, last treatment last Wednesday, started vomiting this am approx 2 am and called her doctors who told her to come in for iv fluids and nausea meds.

## 2014-08-24 NOTE — Assessment & Plan Note (Signed)
Patient received her last FOLFOX chemotherapy on 08/16/2014.  Patient is scheduled to return on 08/30/2014 for labs, follow up visit, and chemotherapy.

## 2014-08-24 NOTE — Progress Notes (Signed)
SYMPTOM MANAGEMENT CLINIC   HPI: Sydney Morrison 59 y.o. female diagnosed with colorectal cancer; with liver metastasis.  Currently undergoing FOLFOX chemotherapy.  Patient continues to complain of chronic nausea; and dry heaving.  She presented to [facility] emergency department earlier today at approximate 4 AM with same complaints.  She received Zofran, morphine for pain, and IV fluid rehydration at that time.  Patient states that she typically has some chronic diarrhea.  Patient states she has had 4 bowel movements today.  Patient denies any other new symptoms.  Patient denies any recent fevers or chills.  HPI  ROS  Past Medical History  Diagnosis Date  . Crohn's disease   . Chronic headache   . Cancer     Past Surgical History  Procedure Laterality Date  . Breast surgery    . Uterine fibroid surgery    . Colonoscopy w/ biopsies  06/29/2014    DR HUNG  . Flexible sigmoidoscopy N/A 06/30/2014    Procedure: FLEXIBLE SIGMOIDOSCOPY;  Surgeon: Carol Ada, MD;  Location: [facility] ENDOSCOPY;  Service: Endoscopy;  Laterality: N/A;  . Colonic stent placement N/A 06/30/2014    Procedure: COLONIC STENT PLACEMENT;  Surgeon: Carol Ada, MD;  Location: [facility] ENDOSCOPY;  Service: Endoscopy;  Laterality: N/A;  with fluro   . Portacath placement N/A 07/04/2014    Procedure: POWER PORT PLACEMENT;  Surgeon: Alphonsa Overall, MD;  Location: [place];  Service: General;  Laterality: N/A;    has Colon cancer; Fever; Chronic blood loss anemia; Liver lesion; Antineoplastic chemotherapy induced anemia; Dehydration; and Chemotherapy-induced neuropathy on her problem list.    is allergic to avelox; codeine; dextromethorphan; erythromycin; penicillins; and suprep.    Medication List       This list is accurate as of: 08/24/14  9:30 PM.  Always use your most recent med list.               acetaminophen 325 MG tablet  Commonly known as:  TYLENOL  Take 650 mg by mouth every 6 (six) hours as needed for  mild pain or fever.     ADRUCIL IV  Inject into the vein every 14 (fourteen) days.     ALPRAZolam 0.5 MG tablet  Commonly known as:  XANAX  Take 0.5 mg by mouth at bedtime as needed for anxiety.     cromolyn 5.2 MG/ACT nasal spray  Commonly known as:  NASALCROM  Place 1 spray into both nostrils at bedtime.     cromolyn 5.2 MG/ACT nasal spray  Commonly known as:  NASALCROM  Place 1 spray into both nostrils at bedtime.     dexamethasone 4 MG tablet  Commonly known as:  DECADRON  Take 1 tablet (4 mg total) by mouth 2 (two) times daily. For 2 days. Begin day of pump disconnect.     leucovorin in dextrose 5 % 250 mL  Inject into the vein every 14 (fourteen) days.     lidocaine-prilocaine cream  Commonly known as:  EMLA  Apply 1 application topically as needed. Apply to portacath site 1-2 hours prior to use     LORazepam 0.5 MG tablet  Commonly known as:  ATIVAN  Take 1 tablet (0.5 mg total) by mouth every 6 (six) hours as needed for anxiety.     meclizine 25 MG tablet  Commonly known as:  ANTIVERT  Take 1 tablet (25 mg total) by mouth 3 (three) times daily as needed for dizziness.     nicotine 14 mg/24hr  patch  Commonly known as:  NICODERM CQ - dosed in mg/24 hours  Place 14 mg onto the skin daily.     ondansetron 4 MG disintegrating tablet  Commonly known as:  ZOFRAN ODT  59m ODT q4 hours prn nausea/vomit     oxaliplatin in dextrose 5 % 500 mL  Inject into the vein every 14 (fourteen) days.     traMADol 50 MG tablet  Commonly known as:  ULTRAM  Take 1 tablet (50 mg total) by mouth every 6 (six) hours as needed.     Vitamin B-12 CR 1500 MCG Tbcr  Take 1 tablet by mouth daily.     zolpidem 5 MG tablet  Commonly known as:  AMBIEN  Take 1 tablet (5 mg total) by mouth at bedtime as needed for sleep.         PHYSICAL EXAMINATION  Oncology Vitals 08/24/2014 08/24/2014 08/24/2014 08/24/2014 08/18/2014 08/16/2014 08/04/2014  Height - - - 170 cm - 170 cm -  Weight - 49.805  kg - 49.896 kg - 50.213 kg -  Weight (lbs) - 109 lbs 13 oz - 110 lbs - 110 lbs 11 oz -  BMI (kg/m2) - - - 17.23 kg/m2 - 17.34 kg/m2 -  Temp 98.3 98.4 97.9 98.7 98.4 (No Data) 98.5  Pulse 74 81 69 74 65 82 65  Resp 18 16 16 20  - 18 -  SpO2 100 99 99 100 99 100 100  BSA (m2) - - - 1.54 m2 - 1.54 m2 -   BP Readings from Last 3 Encounters:  08/24/14 133/63  08/24/14 135/70  08/24/14 119/69    Physical Exam  Constitutional: She is oriented to person, place, and time.  Patient appears fatigued, mildly weak, frail, and chronically ill.  HENT:  Head: Normocephalic and atraumatic.  Eyes: Conjunctivae and EOM are normal. Pupils are equal, round, and reactive to light. Right eye exhibits no discharge. Left eye exhibits no discharge. No scleral icterus.  Neck: Normal range of motion. Neck supple. No JVD present. No tracheal deviation present. No thyromegaly present.  Cardiovascular: Normal rate, regular rhythm, normal heart sounds and intact distal pulses.   Pulmonary/Chest: Effort normal and breath sounds normal. No respiratory distress. She has no wheezes. She has no rales. She exhibits no tenderness.  Abdominal: Soft. Bowel sounds are normal. She exhibits mass. She exhibits no distension. There is tenderness. There is no rebound and no guarding.  Abdomen soft and bowel sounds positive in all 4 quads.  Was able to palpate some questionable nodules to both left and right abdomen.  Questionable if these nodules are actually distended colon with stool.  These areas are slightly tender with palpation.  Musculoskeletal: Normal range of motion. She exhibits no edema or tenderness.  Lymphadenopathy:    She has no cervical adenopathy.  Neurological: She is alert and oriented to person, place, and time. Gait normal.  Skin: Skin is warm and dry. No rash noted. No erythema. There is pallor.  Psychiatric: Affect normal.  Nursing note and vitals reviewed.   LABORATORY DATA:. Admission on 08/24/2014,  Discharged on 08/24/2014  Component Date Value Ref Range Status  . WBC 08/24/2014 6.1  4.0 - 10.5 K/uL Final  . RBC 08/24/2014 3.65* 3.87 - 5.11 MIL/uL Final  . Hemoglobin 08/24/2014 9.7* 12.0 - 15.0 g/dL Final  . HCT 08/24/2014 30.6* 36.0 - 46.0 % Final  . MCV 08/24/2014 83.8  78.0 - 100.0 fL Final  . MCH 08/24/2014 26.6  26.0 -  34.0 pg Final  . MCHC 08/24/2014 31.7  30.0 - 36.0 g/dL Final  . RDW 08/24/2014 16.7* 11.5 - 15.5 % Final  . Platelets 08/24/2014 111* 150 - 400 K/uL Final  . Neutrophils Relative % 08/24/2014 55  43 - 77 % Final  . Neutro Abs 08/24/2014 3.3  1.7 - 7.7 K/uL Final  . Lymphocytes Relative 08/24/2014 29  12 - 46 % Final  . Lymphs Abs 08/24/2014 1.8  0.7 - 4.0 K/uL Final  . Monocytes Relative 08/24/2014 10  3 - 12 % Final  . Monocytes Absolute 08/24/2014 0.6  0.1 - 1.0 K/uL Final  . Eosinophils Relative 08/24/2014 6* 0 - 5 % Final  . Eosinophils Absolute 08/24/2014 0.4  0.0 - 0.7 K/uL Final  . Basophils Relative 08/24/2014 1  0 - 1 % Final  . Basophils Absolute 08/24/2014 0.0  0.0 - 0.1 K/uL Final  . Smear Review 08/24/2014 PLATELETS APPEAR DECREASED   Final  . Sodium 08/24/2014 136  135 - 145 mmol/L Final  . Potassium 08/24/2014 4.0  3.5 - 5.1 mmol/L Final  . Chloride 08/24/2014 100* 101 - 111 mmol/L Final  . CO2 08/24/2014 26  22 - 32 mmol/L Final  . Glucose, Bld 08/24/2014 117* 65 - 99 mg/dL Final  . BUN 08/24/2014 26* 6 - 20 mg/dL Final  . Creatinine, Ser 08/24/2014 0.58  0.44 - 1.00 mg/dL Final  . Calcium 08/24/2014 9.4  8.9 - 10.3 mg/dL Final  . Total Protein 08/24/2014 7.8  6.5 - 8.1 g/dL Final  . Albumin 08/24/2014 4.2  3.5 - 5.0 g/dL Final  . AST 08/24/2014 23  15 - 41 U/L Final  . ALT 08/24/2014 15  14 - 54 U/L Final  . Alkaline Phosphatase 08/24/2014 124  38 - 126 U/L Final  . Total Bilirubin 08/24/2014 0.5  0.3 - 1.2 mg/dL Final  . GFR calc non Af Amer 08/24/2014 >60  >60 mL/min Final  . GFR calc Af Amer 08/24/2014 >60  >60 mL/min Final    Comment: (NOTE) The eGFR has been calculated using the CKD EPI equation. This calculation has not been validated in all clinical situations. eGFR's persistently <60 mL/min signify possible Chronic Kidney Disease.   . Anion gap 08/24/2014 10  5 - 15 Final  . Lipase 08/24/2014 30  22 - 51 U/L Final  . Color, Urine 08/24/2014 YELLOW  YELLOW Final  . APPearance 08/24/2014 CLEAR  CLEAR Final  . Specific Gravity, Urine 08/24/2014 >1.030* 1.005 - 1.030 Final  . pH 08/24/2014 6.0  5.0 - 8.0 Final  . Glucose, UA 08/24/2014 NEGATIVE  NEGATIVE mg/dL Final  . Hgb urine dipstick 08/24/2014 NEGATIVE  NEGATIVE Final  . Bilirubin Urine 08/24/2014 NEGATIVE  NEGATIVE Final  . Ketones, ur 08/24/2014 NEGATIVE  NEGATIVE mg/dL Final  . Protein, ur 08/24/2014 TRACE* NEGATIVE mg/dL Final  . Urobilinogen, UA 08/24/2014 0.2  0.0 - 1.0 mg/dL Final  . Nitrite 08/24/2014 NEGATIVE  NEGATIVE Final  . Leukocytes, UA 08/24/2014 NEGATIVE  NEGATIVE Final  . RBC / HPF 08/24/2014 0-2  <3 RBC/hpf Final     RADIOGRAPHIC STUDIES: Dg Abd Acute W/chest  08/24/2014   CLINICAL DATA:  Chemotherapy for colon cancer. Dehydration. Diffuse pain.  EXAM: DG ABDOMEN ACUTE W/ 1V CHEST  COMPARISON:  Abdominal CT 07/23/2014  FINDINGS: Chronic retention of stool above a sigmoid colon metallic stent. No evidence of high-grade obstruction or change from previous. No concerning intra-abdominal mass effect or calcification. Lung bases are clear.  IMPRESSION: Chronic retention of stool above of the patient's sigmoid colon stent. The stool appears desiccated and is likely resistant to passing through the stent. No high-grade obstruction or progression since 07/23/2014.   Electronically Signed   By: Monte Fantasia M.D.   On: 08/24/2014 06:51    ASSESSMENT/PLAN:    Colon cancer Patient received her last FOLFOX chemotherapy on 08/16/2014.  Patient is scheduled to return on 08/30/2014 for labs, follow up visit, and  chemotherapy.   Antineoplastic chemotherapy induced anemia Patient received her last chemotherapy on 08/16/2014.  She is currently mildly anemic with a hemoglobin of 9.7.  Patient denies any worsening issues with either fatigue or shortness of breath with exertion.  Will continue to monitor closely.   Dehydration Patient continues to complain of chronic nausea; and dry heaving.  She presented to [facility] emergency department earlier today at approximate 4 AM with same complaints.  She received Zofran, morphine for pain, and IV fluid rehydration at that time.  On exam.-Patient continues to be nauseous; but is not vomiting.  Patient received Zofran and Ativan IV while at the [place] today.  Patient's nausea appears to be much better under control.  Patient was advised to continue with a clear liquid diet until she is able to manage a bland diet.  Confirmed that patient has both Zofran and Ativan to take at home for nausea.  The plan is for the patient to call first thing in the morning if she continues with nausea, dry heaving, and dehydration.  May very well need to consider admission for failed outpatient therapy.   Chemotherapy-induced neuropathy Patient is complaining of some mild progressive chemotherapy-induced neuropathy to all extremities.  Will continue to monitor closely.   Patient stated understanding of all instructions; and was in agreement with this plan of care. The patient knows to call the clinic with any problems, questions or concerns.   This was a shared visit with Dr. Benay Spice today.  Total time spent with patient was 40 minutes;  with greater than 75 percent of that time spent in face to face counseling regarding patient's symptoms,  and coordination of care and follow up.  Disclaimer: This note was dictated with voice recognition software. Similar sounding words can inadvertently be transcribed and may not be corrected upon review.   Drue Second,  NP 08/24/2014   This was a shared visit with Drue Second. Ms. Roedel was interviewed and examined. Her symptoms could be related to delayed nausea from chemotherapy, constipation, progressive metastatic carcinoma, or a stent-related bowel injury.  Her symptoms improved after intravenous fluids and anti-metics today. We will contact her on 08/25/2014 to check on her condition.  Julieanne Manson, M.D.

## 2014-08-25 ENCOUNTER — Encounter (HOSPITAL_COMMUNITY): Payer: Self-pay | Admitting: *Deleted

## 2014-08-25 ENCOUNTER — Telehealth: Payer: Self-pay | Admitting: *Deleted

## 2014-08-25 ENCOUNTER — Telehealth: Payer: Self-pay | Admitting: Oncology

## 2014-08-25 ENCOUNTER — Inpatient Hospital Stay (HOSPITAL_COMMUNITY): Payer: 59

## 2014-08-25 ENCOUNTER — Ambulatory Visit (HOSPITAL_BASED_OUTPATIENT_CLINIC_OR_DEPARTMENT_OTHER): Payer: 59 | Admitting: Nurse Practitioner

## 2014-08-25 ENCOUNTER — Inpatient Hospital Stay (HOSPITAL_COMMUNITY)
Admission: AD | Admit: 2014-08-25 | Discharge: 2014-09-21 | DRG: 329 | Disposition: A | Discharge status: Home Health | Payer: 59 | Source: Ambulatory Visit | Attending: Internal Medicine | Admitting: Internal Medicine

## 2014-08-25 VITALS — BP 135/62 | HR 97 | Temp 98.3°F | Resp 18 | Ht 67.0 in | Wt 110.0 lb

## 2014-08-25 DIAGNOSIS — K5090 Crohn's disease, unspecified, without complications: Secondary | ICD-10-CM | POA: Diagnosis present

## 2014-08-25 DIAGNOSIS — Z720 Tobacco use: Secondary | ICD-10-CM

## 2014-08-25 DIAGNOSIS — D500 Iron deficiency anemia secondary to blood loss (chronic): Secondary | ICD-10-CM

## 2014-08-25 DIAGNOSIS — A419 Sepsis, unspecified organism: Secondary | ICD-10-CM

## 2014-08-25 DIAGNOSIS — C19 Malignant neoplasm of rectosigmoid junction: Secondary | ICD-10-CM | POA: Diagnosis present

## 2014-08-25 DIAGNOSIS — E876 Hypokalemia: Secondary | ICD-10-CM | POA: Diagnosis not present

## 2014-08-25 DIAGNOSIS — I4892 Unspecified atrial flutter: Secondary | ICD-10-CM | POA: Diagnosis not present

## 2014-08-25 DIAGNOSIS — R509 Fever, unspecified: Secondary | ICD-10-CM | POA: Diagnosis not present

## 2014-08-25 DIAGNOSIS — L89150 Pressure ulcer of sacral region, unstageable: Secondary | ICD-10-CM | POA: Diagnosis not present

## 2014-08-25 DIAGNOSIS — C189 Malignant neoplasm of colon, unspecified: Secondary | ICD-10-CM

## 2014-08-25 DIAGNOSIS — K904 Malabsorption due to intolerance, not elsewhere classified: Secondary | ICD-10-CM | POA: Diagnosis not present

## 2014-08-25 DIAGNOSIS — D62 Acute posthemorrhagic anemia: Secondary | ICD-10-CM | POA: Diagnosis not present

## 2014-08-25 DIAGNOSIS — I483 Typical atrial flutter: Secondary | ICD-10-CM | POA: Diagnosis not present

## 2014-08-25 DIAGNOSIS — E871 Hypo-osmolality and hyponatremia: Secondary | ICD-10-CM | POA: Diagnosis not present

## 2014-08-25 DIAGNOSIS — L8991 Pressure ulcer of unspecified site, stage 1: Secondary | ICD-10-CM | POA: Diagnosis not present

## 2014-08-25 DIAGNOSIS — I9581 Postprocedural hypotension: Secondary | ICD-10-CM | POA: Diagnosis not present

## 2014-08-25 DIAGNOSIS — C784 Secondary malignant neoplasm of small intestine: Secondary | ICD-10-CM | POA: Diagnosis present

## 2014-08-25 DIAGNOSIS — D6959 Other secondary thrombocytopenia: Secondary | ICD-10-CM | POA: Diagnosis present

## 2014-08-25 DIAGNOSIS — R112 Nausea with vomiting, unspecified: Secondary | ICD-10-CM

## 2014-08-25 DIAGNOSIS — C786 Secondary malignant neoplasm of retroperitoneum and peritoneum: Secondary | ICD-10-CM | POA: Diagnosis present

## 2014-08-25 DIAGNOSIS — Z931 Gastrostomy status: Secondary | ICD-10-CM | POA: Diagnosis not present

## 2014-08-25 DIAGNOSIS — Z515 Encounter for palliative care: Secondary | ICD-10-CM | POA: Insufficient documentation

## 2014-08-25 DIAGNOSIS — C20 Malignant neoplasm of rectum: Secondary | ICD-10-CM

## 2014-08-25 DIAGNOSIS — C787 Secondary malignant neoplasm of liver and intrahepatic bile duct: Secondary | ICD-10-CM

## 2014-08-25 DIAGNOSIS — Z113 Encounter for screening for infections with a predominantly sexual mode of transmission: Secondary | ICD-10-CM | POA: Insufficient documentation

## 2014-08-25 DIAGNOSIS — Z681 Body mass index (BMI) 19 or less, adult: Secondary | ICD-10-CM | POA: Diagnosis not present

## 2014-08-25 DIAGNOSIS — D6481 Anemia due to antineoplastic chemotherapy: Secondary | ICD-10-CM | POA: Diagnosis present

## 2014-08-25 DIAGNOSIS — D696 Thrombocytopenia, unspecified: Secondary | ICD-10-CM | POA: Diagnosis not present

## 2014-08-25 DIAGNOSIS — K5669 Other intestinal obstruction: Secondary | ICD-10-CM | POA: Diagnosis not present

## 2014-08-25 DIAGNOSIS — Z452 Encounter for adjustment and management of vascular access device: Secondary | ICD-10-CM

## 2014-08-25 DIAGNOSIS — K5900 Constipation, unspecified: Secondary | ICD-10-CM | POA: Diagnosis present

## 2014-08-25 DIAGNOSIS — Z881 Allergy status to other antibiotic agents status: Secondary | ICD-10-CM | POA: Diagnosis not present

## 2014-08-25 DIAGNOSIS — E43 Unspecified severe protein-calorie malnutrition: Secondary | ICD-10-CM | POA: Diagnosis present

## 2014-08-25 DIAGNOSIS — D72829 Elevated white blood cell count, unspecified: Secondary | ICD-10-CM

## 2014-08-25 DIAGNOSIS — K631 Perforation of intestine (nontraumatic): Secondary | ICD-10-CM | POA: Diagnosis not present

## 2014-08-25 DIAGNOSIS — J189 Pneumonia, unspecified organism: Secondary | ICD-10-CM | POA: Diagnosis not present

## 2014-08-25 DIAGNOSIS — R1032 Left lower quadrant pain: Secondary | ICD-10-CM

## 2014-08-25 DIAGNOSIS — I959 Hypotension, unspecified: Secondary | ICD-10-CM | POA: Diagnosis not present

## 2014-08-25 DIAGNOSIS — Z7189 Other specified counseling: Secondary | ICD-10-CM | POA: Insufficient documentation

## 2014-08-25 DIAGNOSIS — R1110 Vomiting, unspecified: Secondary | ICD-10-CM | POA: Diagnosis not present

## 2014-08-25 DIAGNOSIS — K913 Postprocedural intestinal obstruction: Secondary | ICD-10-CM | POA: Diagnosis not present

## 2014-08-25 DIAGNOSIS — K9189 Other postprocedural complications and disorders of digestive system: Secondary | ICD-10-CM

## 2014-08-25 DIAGNOSIS — L8990 Pressure ulcer of unspecified site, unspecified stage: Secondary | ICD-10-CM | POA: Diagnosis not present

## 2014-08-25 DIAGNOSIS — I9589 Other hypotension: Secondary | ICD-10-CM | POA: Diagnosis not present

## 2014-08-25 DIAGNOSIS — R5082 Postprocedural fever: Secondary | ICD-10-CM | POA: Diagnosis not present

## 2014-08-25 DIAGNOSIS — T451X5A Adverse effect of antineoplastic and immunosuppressive drugs, initial encounter: Secondary | ICD-10-CM | POA: Diagnosis present

## 2014-08-25 DIAGNOSIS — D638 Anemia in other chronic diseases classified elsewhere: Secondary | ICD-10-CM | POA: Diagnosis not present

## 2014-08-25 DIAGNOSIS — Z79891 Long term (current) use of opiate analgesic: Secondary | ICD-10-CM | POA: Diagnosis not present

## 2014-08-25 DIAGNOSIS — K567 Ileus, unspecified: Secondary | ICD-10-CM

## 2014-08-25 DIAGNOSIS — Z933 Colostomy status: Secondary | ICD-10-CM | POA: Diagnosis not present

## 2014-08-25 DIAGNOSIS — E860 Dehydration: Secondary | ICD-10-CM | POA: Diagnosis present

## 2014-08-25 DIAGNOSIS — K56609 Unspecified intestinal obstruction, unspecified as to partial versus complete obstruction: Secondary | ICD-10-CM

## 2014-08-25 DIAGNOSIS — R627 Adult failure to thrive: Secondary | ICD-10-CM | POA: Diagnosis present

## 2014-08-25 DIAGNOSIS — R000 Tachycardia, unspecified: Secondary | ICD-10-CM | POA: Diagnosis not present

## 2014-08-25 DIAGNOSIS — Z885 Allergy status to narcotic agent status: Secondary | ICD-10-CM | POA: Diagnosis not present

## 2014-08-25 DIAGNOSIS — Z888 Allergy status to other drugs, medicaments and biological substances status: Secondary | ICD-10-CM | POA: Diagnosis not present

## 2014-08-25 DIAGNOSIS — Z880 Allergy status to penicillin: Secondary | ICD-10-CM | POA: Diagnosis not present

## 2014-08-25 DIAGNOSIS — R079 Chest pain, unspecified: Secondary | ICD-10-CM

## 2014-08-25 DIAGNOSIS — Z87891 Personal history of nicotine dependence: Secondary | ICD-10-CM | POA: Diagnosis not present

## 2014-08-25 DIAGNOSIS — Z79899 Other long term (current) drug therapy: Secondary | ICD-10-CM | POA: Diagnosis not present

## 2014-08-25 DIAGNOSIS — K9049 Malabsorption due to intolerance, not elsewhere classified: Secondary | ICD-10-CM

## 2014-08-25 DIAGNOSIS — Z9889 Other specified postprocedural states: Secondary | ICD-10-CM | POA: Diagnosis not present

## 2014-08-25 DIAGNOSIS — R51 Headache: Secondary | ICD-10-CM | POA: Diagnosis present

## 2014-08-25 HISTORY — DX: Nausea with vomiting, unspecified: R11.2

## 2014-08-25 LAB — COMPREHENSIVE METABOLIC PANEL
ALK PHOS: 99 U/L (ref 38–126)
ALT: 14 U/L (ref 14–54)
AST: 20 U/L (ref 15–41)
Albumin: 3.5 g/dL (ref 3.5–5.0)
Anion gap: 10 (ref 5–15)
BILIRUBIN TOTAL: 0.6 mg/dL (ref 0.3–1.2)
BUN: 29 mg/dL — ABNORMAL HIGH (ref 6–20)
CALCIUM: 8.5 mg/dL — AB (ref 8.9–10.3)
CHLORIDE: 99 mmol/L — AB (ref 101–111)
CO2: 27 mmol/L (ref 22–32)
Creatinine, Ser: 0.61 mg/dL (ref 0.44–1.00)
GLUCOSE: 104 mg/dL — AB (ref 65–99)
POTASSIUM: 4.1 mmol/L (ref 3.5–5.1)
SODIUM: 136 mmol/L (ref 135–145)
Total Protein: 6.6 g/dL (ref 6.5–8.1)

## 2014-08-25 LAB — CBC WITH DIFFERENTIAL/PLATELET
BASOS ABS: 0 10*3/uL (ref 0.0–0.1)
Basophils Relative: 0 % (ref 0–1)
Eosinophils Absolute: 0 10*3/uL (ref 0.0–0.7)
Eosinophils Relative: 0 % (ref 0–5)
HCT: 26.2 % — ABNORMAL LOW (ref 36.0–46.0)
Hemoglobin: 8.5 g/dL — ABNORMAL LOW (ref 12.0–15.0)
LYMPHS ABS: 0.9 10*3/uL (ref 0.7–4.0)
Lymphocytes Relative: 17 % (ref 12–46)
MCH: 27.1 pg (ref 26.0–34.0)
MCHC: 32.4 g/dL (ref 30.0–36.0)
MCV: 83.4 fL (ref 78.0–100.0)
MONO ABS: 0.9 10*3/uL (ref 0.1–1.0)
MONOS PCT: 17 % — AB (ref 3–12)
Neutro Abs: 3.6 10*3/uL (ref 1.7–7.7)
Neutrophils Relative %: 66 % (ref 43–77)
Platelets: 117 10*3/uL — ABNORMAL LOW (ref 150–400)
RBC: 3.14 MIL/uL — ABNORMAL LOW (ref 3.87–5.11)
RDW: 16.6 % — ABNORMAL HIGH (ref 11.5–15.5)
WBC Morphology: INCREASED
WBC: 5.4 10*3/uL (ref 4.0–10.5)

## 2014-08-25 LAB — PROTIME-INR
INR: 1.18 (ref 0.00–1.49)
Prothrombin Time: 15.2 seconds (ref 11.6–15.2)

## 2014-08-25 LAB — APTT: aPTT: 30 seconds (ref 24–37)

## 2014-08-25 LAB — MAGNESIUM: Magnesium: 2.1 mg/dL (ref 1.7–2.4)

## 2014-08-25 LAB — PHOSPHORUS: Phosphorus: 3.2 mg/dL (ref 2.5–4.6)

## 2014-08-25 MED ORDER — SODIUM CHLORIDE 0.9 % IJ SOLN
10.0000 mL | INTRAMUSCULAR | Status: DC | PRN
  Administered 2014-08-25 – 2014-09-10 (×8): 10 mL
  Administered 2014-09-10 – 2014-09-12 (×4): 20 mL
  Administered 2014-09-13 – 2014-09-14 (×5): 10 mL
  Administered 2014-09-14: 20 mL
  Administered 2014-09-14 – 2014-09-16 (×3): 10 mL
  Administered 2014-09-17: 20 mL
  Administered 2014-09-17 – 2014-09-21 (×7): 10 mL
  Filled 2014-08-25 (×28): qty 40

## 2014-08-25 MED ORDER — PROMETHAZINE HCL 25 MG/ML IJ SOLN: 25.0000 mg | Freq: Three times a day (TID) | INTRAMUSCULAR | Status: DC | PRN

## 2014-08-25 MED ORDER — SODIUM CHLORIDE 0.9 % IV SOLN
250.0000 mg | Freq: Four times a day (QID) | INTRAVENOUS | Status: DC
  Administered 2014-08-25 – 2014-08-30 (×20): 250 mg via INTRAVENOUS
  Filled 2014-08-25 (×21): qty 250

## 2014-08-25 MED ORDER — SODIUM CHLORIDE 0.9 % IJ SOLN
3.0000 mL | Freq: Two times a day (BID) | INTRAMUSCULAR | Status: DC
  Administered 2014-08-29 – 2014-09-20 (×15): 3 mL via INTRAVENOUS

## 2014-08-25 MED ORDER — CETYLPYRIDINIUM CHLORIDE 0.05 % MT LIQD
7.0000 mL | Freq: Two times a day (BID) | OROMUCOSAL | Status: DC
  Administered 2014-08-26 – 2014-09-01 (×12): 7 mL via OROMUCOSAL

## 2014-08-25 MED ORDER — FENTANYL CITRATE (PF) 100 MCG/2ML IJ SOLN
25.0000 ug | INTRAMUSCULAR | Status: DC | PRN
  Administered 2014-08-25: 25 ug via INTRAVENOUS
  Filled 2014-08-25: qty 2

## 2014-08-25 MED ORDER — SODIUM CHLORIDE 0.9 % IJ SOLN
10.0000 mL | Freq: Two times a day (BID) | INTRAMUSCULAR | Status: DC
  Administered 2014-08-29 – 2014-09-21 (×12): 10 mL

## 2014-08-25 MED ORDER — PROMETHAZINE HCL 25 MG/ML IJ SOLN: 25.0000 mg | Freq: Four times a day (QID) | INTRAMUSCULAR | Status: DC | PRN

## 2014-08-25 MED ORDER — PROMETHAZINE HCL 25 MG/ML IJ SOLN
25.0000 mg | Freq: Four times a day (QID) | INTRAMUSCULAR | Status: DC
  Administered 2014-08-25: 25 mg via INTRAVENOUS
  Filled 2014-08-25: qty 1

## 2014-08-25 MED ORDER — METRONIDAZOLE IN NACL 5-0.79 MG/ML-% IV SOLN
500.0000 mg | Freq: Three times a day (TID) | INTRAVENOUS | Status: DC
  Administered 2014-08-25 – 2014-08-27 (×5): 500 mg via INTRAVENOUS
  Filled 2014-08-25 (×5): qty 100

## 2014-08-25 MED ORDER — SODIUM CHLORIDE 0.9 % IV SOLN
INTRAVENOUS | Status: AC
  Administered 2014-08-25 – 2014-08-28 (×6): via INTRAVENOUS

## 2014-08-25 MED ORDER — LORAZEPAM 2 MG/ML IJ SOLN
1.0000 mg | Freq: Four times a day (QID) | INTRAMUSCULAR | Status: DC | PRN
  Administered 2014-08-26 – 2014-08-31 (×17): 1 mg via INTRAVENOUS
  Filled 2014-08-25 (×18): qty 1

## 2014-08-25 MED ORDER — ONDANSETRON HCL 4 MG PO TABS: 4.0000 mg | ORAL_TABLET | Freq: Four times a day (QID) | ORAL | Status: DC | PRN

## 2014-08-25 MED ORDER — ACETAMINOPHEN 325 MG PO TABS: 650.0000 mg | ORAL_TABLET | Freq: Four times a day (QID) | ORAL | Status: DC | PRN

## 2014-08-25 MED ORDER — ACETAMINOPHEN 650 MG RE SUPP
650.0000 mg | Freq: Four times a day (QID) | RECTAL | Status: DC | PRN
  Administered 2014-08-25 – 2014-09-04 (×5): 650 mg via RECTAL
  Filled 2014-08-25 (×5): qty 1

## 2014-08-25 MED ORDER — ONDANSETRON HCL 4 MG/2ML IJ SOLN: 4.0000 mg | Freq: Four times a day (QID) | INTRAMUSCULAR | Status: DC | PRN

## 2014-08-25 MED ORDER — METOCLOPRAMIDE HCL 5 MG/ML IJ SOLN
5.0000 mg | Freq: Three times a day (TID) | INTRAMUSCULAR | Status: DC
  Administered 2014-08-25 – 2014-08-28 (×9): 5 mg via INTRAVENOUS
  Filled 2014-08-25 (×9): qty 2

## 2014-08-25 NOTE — Progress Notes (Signed)
ANTIBIOTIC CONSULT NOTE - INITIAL  Pharmacy Consult for primaxin and flagyl Indication: Intra-abdominal Infection  Allergies  Allergen Reactions  . Avelox [Moxifloxacin Hcl In Nacl] Hives  . Codeine Hives  . Dextromethorphan Hives  . Erythromycin Hives  . Penicillins Hives  . Suprep [Na Sulfate-K Sulfate-Mg Sulf] Nausea And Vomiting    Patient Measurements: Height: 5\' 7"  (170.2 cm) Weight: 109 lb 4.8 oz (49.578 kg) IBW/kg (Calculated) : 61.6  Vital Signs: Temp: 98.3 F (36.8 C) (05/27 1500) Temp Source: Oral (05/27 1500) BP: 128/61 mmHg (05/27 1500) Pulse Rate: 78 (05/27 1500) Intake/Output from previous day:   Intake/Output from this shift:    Labs:  Recent Labs  08/24/14 0510  WBC 6.1  HGB 9.7*  PLT 111*  CREATININE 0.58   Estimated Creatinine Clearance: 60.8 mL/min (by C-G formula based on Cr of 0.58). No results for input(s): VANCOTROUGH, VANCOPEAK, VANCORANDOM, GENTTROUGH, GENTPEAK, GENTRANDOM, TOBRATROUGH, TOBRAPEAK, TOBRARND, AMIKACINPEAK, AMIKACINTROU, AMIKACIN in the last 72 hours.   Microbiology: No results found for this or any previous visit (from the past 720 hour(s)).  Medical History: Past Medical History  Diagnosis Date  . Crohn's disease   . Chronic headache   . Cancer     Assessment: Patient's a 59 y.o F with rectal cancer currently undergoing chemotherapy treatment.  She was seen and treated in the ED for nausea on 5/26.  She's now admitted to [facility] for further workup and management of persistent n/v.  To start abx empirically for suspected intra-abd infection. Of note, patient is allergic to PCN but tolerated primaxin in the past.   Plan:  - primaxin 250 mg IV q6h - flagyl 500 mg IV q8h  Isaac Lacson P 08/25/2014,4:30 PM

## 2014-08-25 NOTE — Telephone Encounter (Signed)
Patient states that she is having nausea without relief. Patient last ativan was 11am. MD Benay Spice notified and POF sent to scheduler for office visit.

## 2014-08-25 NOTE — H&P (Addendum)
[facility] History and Physical  Sydney Morrison WTU:[id number] DOB: 1955-07-18 DOA: 08/25/2014  Referring physician: Dr. Benay Spice from [place]  PCP: Delphina Cahill, MD  Chief Complaint: Nausea, vomiting, abdominal discomfort  HPI:  59 year old female with past medical history significant for rectal cancer with liver metastasis, rectal stent placement in 06/2014 (under Dr. Gearldine Shown care), status post 3 cycles of chemotherapy, last cycle completed 08/16/2014. She started to develop nausea and vomiting over past 24 hours prior to this admission. She was seen in the [facility] one day prior to this admission and was given IV fluids, Zofran, Ativan and Decadron and was then sent home. She thought she felt better however after eating saltine crackers she started vomiting again and started to experience worsening abdominal discomfort especially in the lower abdomen after vomiting. Patient reports abdominal pain to be present about 30 minutes prior to vomiting and lasts for about 30 minutes after vomiting. No blood in emesis. In regards to abdominal pain, when it's present it is sharp, intermittent and 10 out of 10 in intensity. Pain medications do not help alleviate the pain. As a matter fact, patient feels if her nausea and vomiting is controlled then abdominal pain would have been controlled as well. She reports no associated fevers however she does endorse having chills and feeling warm. No reports of diarrhea. She had about 6 bowel movements in past 24 hours, all formed. No reports of blood in the stool. No reports of chest pain, shortness of breath or palpitations. No lightheadedness or falls.  On admission, patient was hemodynamically stable. She required admission blood work by the one present at the time of her arrival to the floor unit she had blood work from 08/24/2014 which showed hemoglobin of 9.7, platelets of 111 and normal renal function. Abdominal x-ray on 08/24/2014 showed  chronic retention of the stool above the patient sigmoid colon stent. No high-grade obstruction was seen.    Assessment & Plan    Principal problem: Nausea, vomiting, abdominal pain in patient with history of metastatic rectal cancer and rectal stent - Patient was directly admitted from cancer center to the floor unit. Obvious concern is for possible abscess formation, bowel perforation considering she did have rectal stent placement about a month ago. - Abdominal x-ray done on 08/24/2014 showed chronic retention of the stool above the patient sigmoid colon stent but in no high-grade obstruction evident. - We will treat empirically with Primaxin and Flagyl for possible bowel perforation or acute intra-abdominal infection - Obtain CT abdomen without contrast. - Continue IV fluids for hydration. - Patient is severely nauseous and has persistent vomiting. Zofran does not help. Order placed for Phenergan scheduled as well as as needed. Order also placed for a scheduled Reglan.  Active problems: Rectal cancer with liver metastasis - Appreciate oncology following. - Patient is status post 3 cycles of chemotherapy, last chemotherapy completed 08/16/2014  Anemia of chronic disease / thrombocytopenia - Likely sequela of chemotherapy - Hemoglobin is 9.7 and subsequently 8.5. Platelet count is 111, 117. Stable. - No current indications for transfusion.   Severe protein calorie malnutrition - In the context of chronic illness - Nutrition consulted   DVT prophylaxis:  - SCD's bilaterally   Radiological Exams on Admission: Dg Abd Acute W/chest 08/24/2014 Chronic retention of stool above of the patient's sigmoid colon stent. The stool appears desiccated and is likely resistant to passing through the stent. No high-grade obstruction or progression since 07/23/2014.   Electronically Signed  By: Monte Fantasia M.D.   On: 08/24/2014 06:51    Code Status: Full Family Communication: Plan of care  discussed with the patient  Disposition Plan: Admit for further evaluation  Leisa Lenz, MD  [facility] Pager [phone]  Time spent in minutes: 75 minutes  Review of Systems:  Constitutional: Negative for fever, chills and malaise/fatigue. Negative for diaphoresis.  HENT: Negative for hearing loss, ear pain, nosebleeds, congestion, sore throat, neck pain, tinnitus and ear discharge.   Eyes: Negative for blurred vision, double vision, photophobia, pain, discharge and redness.  Respiratory: Negative for cough, hemoptysis, sputum production, shortness of breath, wheezing and stridor.   Cardiovascular: Negative for chest pain, palpitations, orthopnea, claudication and leg swelling.  Gastrointestinal: positive for nausea, vomiting and abdominal pain. Negative for heartburn, constipation, blood in stool and melena.  Genitourinary: Negative for dysuria, urgency, frequency, hematuria and flank pain.  Musculoskeletal: Negative for myalgias, back pain, joint pain and falls.  Skin: Negative for itching and rash.  Neurological: Negative for dizziness and weakness. Negative for tingling, tremors, sensory change, speech change, focal weakness, loss of consciousness and headaches.  Endo/Heme/Allergies: Negative for environmental allergies and polydipsia. Does not bruise/bleed easily.  Psychiatric/Behavioral: Negative for suicidal ideas. The patient is not nervous/anxious.      Past Medical History  Diagnosis Date  . Crohn's disease   . Chronic headache   . Cancer    Past Surgical History  Procedure Laterality Date  . Breast surgery    . Uterine fibroid surgery    . Colonoscopy w/ biopsies  06/29/2014    DR HUNG  . Flexible sigmoidoscopy N/A 06/30/2014    Procedure: FLEXIBLE SIGMOIDOSCOPY;  Surgeon: Carol Ada, MD;  Location: [facility] ENDOSCOPY;  Service: Endoscopy;  Laterality: N/A;  . Colonic stent placement N/A 06/30/2014    Procedure: COLONIC STENT PLACEMENT;  Surgeon: Carol Ada, MD;   Location: [facility] ENDOSCOPY;  Service: Endoscopy;  Laterality: N/A;  with fluro   . Portacath placement N/A 07/04/2014    Procedure: POWER PORT PLACEMENT;  Surgeon: Alphonsa Overall, MD;  Location: [facility];  Service: General;  Laterality: N/A;   Social History:  reports that she quit smoking about 2 months ago. Her smoking use included Cigarettes. She has a 20 pack-year smoking history. She has never used smokeless tobacco. She reports that she does not drink alcohol or use illicit drugs.  Allergies  Allergen Reactions  . Avelox [Moxifloxacin Hcl In Nacl] Hives  . Codeine Hives  . Dextromethorphan Hives  . Erythromycin Hives  . Penicillins Hives  . Suprep [Na Sulfate-K Sulfate-Mg Sulf] Nausea And Vomiting    Family History:  Family History  Problem Relation Age of Onset  . Hypertension Mother   . Cancer Father      Prior to Admission medications   Medication Sig Start Date End Date Taking? Authorizing Provider  acetaminophen (TYLENOL) 325 MG tablet Take 325 mg by mouth every 6 (six) hours as needed for fever or headache (headahce).    Yes Historical Provider, MD  cromolyn (NASALCROM) 5.2 MG/ACT nasal spray Place 1 spray into both nostrils at bedtime.   Yes Historical Provider, MD  Cyanocobalamin (VITAMIN B-12 CR) 1500 MCG TBCR Take 1 tablet by mouth daily.   Yes Historical Provider, MD  dexamethasone (DECADRON) 4 MG tablet Take 1 tablet (4 mg total) by mouth 2 (two) times daily. For 2 days. Begin day of pump disconnect. 08/16/14  Yes Ladell Pier, MD  Fluorouracil (ADRUCIL IV) Inject into the vein  every 14 (fourteen) days.   Yes Historical Provider, MD  leucovorin in dextrose 5 % 250 mL Inject into the vein every 14 (fourteen) days.   Yes Historical Provider, MD  lidocaine-prilocaine (EMLA) cream Apply 1 application topically as needed. Apply to portacath site 1-2 hours prior to use 07/14/14  Yes Owens Shark, NP  LORazepam (ATIVAN) 0.5 MG tablet Take 1 tablet (0.5 mg total) by mouth every 6  (six) hours as needed for anxiety. 08/21/14  Yes Ladell Pier, MD  nicotine (NICODERM CQ - DOSED IN MG/24 HOURS) 14 mg/24hr patch Place 14 mg onto the skin daily.   Yes Historical Provider, MD  omeprazole (PRILOSEC) 40 MG capsule Take 40 mg by mouth daily as needed (nausea and vomiting).   Yes Historical Provider, MD  ondansetron (ZOFRAN ODT) 4 MG disintegrating tablet 4mg  ODT q4 hours prn nausea/vomit 08/24/14  Yes Orpah Greek, MD  oxaliplatin in dextrose 5 % 500 mL Inject into the vein every 14 (fourteen) days.   Yes Historical Provider, MD  zolpidem (AMBIEN) 5 MG tablet Take 1 tablet (5 mg total) by mouth at bedtime as needed for sleep. 07/14/14  Yes Owens Shark, NP  traMADol (ULTRAM) 50 MG tablet Take 1 tablet (50 mg total) by mouth every 6 (six) hours as needed. 07/23/14   Nat Christen, MD   Physical Exam: Filed Vitals:   08/25/14 1500  BP: 128/61  Pulse: 78  Temp: 98.3 F (36.8 C)  TempSrc: Oral  Resp: 16  Height: 5\' 7"  (1.702 m)  Weight: 49.578 kg (109 lb 4.8 oz)  SpO2: 100%    Physical Exam  Constitutional: Appears ill, pale, malnourished  HENT: Normocephalic. No tonsillar erythema or exudates Eyes: Conjunctivae and EOM are normal. PERRLA, no scleral icterus.  Neck: Normal ROM. Neck supple. No JVD. No tracheal deviation. No thyromegaly.  CVS: RRR, S1/S2 +, no murmurs, no gallops, no carotid bruit.  Pulmonary: Effort and breath sounds normal, no stridor, rhonchi, wheezes, rales.  Abdominal: Soft. BS +,  no distension, tenderness in lower abdomen with some masses palpated in left and mid lower abdomen, no rebound or guarding.  Musculoskeletal: Normal range of motion. No edema and no tenderness.  Lymphadenopathy: No lymphadenopathy noted, cervical, inguinal. Neuro: Alert. Normal reflexes, muscle tone coordination. No focal neurologic deficits. Skin: Skin is warm and dry. No rash noted.  No erythema. No pallor.  Psychiatric: Normal mood and affect. Behavior, judgment,  thought content normal.   Labs on Admission:  Basic Metabolic Panel:  Recent Labs Lab 08/24/14 0510 08/25/14 1734  NA 136 136  K 4.0 4.1  CL 100* 99*  CO2 26 27  GLUCOSE 117* 104*  BUN 26* 29*  CREATININE 0.58 0.61  CALCIUM 9.4 8.5*  MG  --  2.1  PHOS  --  3.2   Liver Function Tests:  Recent Labs Lab 08/24/14 0510 08/25/14 1734  AST 23 20  ALT 15 14  ALKPHOS 124 99  BILITOT 0.5 0.6  PROT 7.8 6.6  ALBUMIN 4.2 3.5    Recent Labs Lab 08/24/14 0510  LIPASE 30   No results for input(s): AMMONIA in the last 168 hours. CBC:  Recent Labs Lab 08/24/14 0510 08/25/14 1734  WBC 6.1 5.4  NEUTROABS 3.3 3.6  HGB 9.7* 8.5*  HCT 30.6* 26.2*  MCV 83.8 83.4  PLT 111* 117*   Cardiac Enzymes: No results for input(s): CKTOTAL, CKMB, CKMBINDEX, TROPONINI in the last 168 hours. BNP: Invalid input(s): POCBNP CBG:  No results for input(s): GLUCAP in the last 168 hours.  If 7PM-7AM, please contact night-coverage [email or web address] Password [facility] 08/25/2014, 7:14 PM

## 2014-08-25 NOTE — Telephone Encounter (Signed)
s.w. pt and confirmed todays appt....pt ok and aware

## 2014-08-25 NOTE — Telephone Encounter (Signed)
TC to pt- she is feeling only slightly better than yesterday. She able to swallow and keep down very minimal amounts of water. Pt has been taking Ativan every 6 hours. She has not been able to eat anything. Pt would like to avoid the ED. She continues to feel very weak. Pt is going to try to eat lunch and follow up with [place] this afternoon.   Spoke to Dr. Benay Spice about pt condition and hospitalization.  Rtn call to pt will be made around 1pm for follow up of lunch intake and tolerance. Check on pain level, location and bowel movements.

## 2014-08-25 NOTE — Progress Notes (Addendum)
[place] OFFICE PROGRESS NOTE   Diagnosis:  Rectal cancer  INTERVAL HISTORY:   Sydney Morrison returns prior to scheduled follow-up. She completed cycle 3 FOLFOX on 08/16/2014. On 08/21/2014 she developed nausea. The nausea persisted and she was seen in the emergency room 08/24/2014. Labs including a CBC, chemistry panel, lipase and urinalysis were essentially unremarkable. Abdominal x-ray showed chronic retention of stool above a sigmoid colon metallic stent. No evidence of high-grade obstruction or change from the previous. She was seen in our office later in the day on 08/24/2014 and received intravenous hydration, Zofran, steroids and Ativan.  The nausea has persisted. She is having intermittent vomiting. She is tolerating very small sips of water. Since yesterday she has consumed approximately one bottle of water in total. She tried to eat a saltine cracker this morning. Since then she has been experiencing pain at the left abdomen. She reports over the past 2 days she has been having intermittent chills and sweats. No documented fever. Thus far today she has had 3 soft, formed bowel movements. She intermittently notes blood with bowel movements. She attributes this to hemorrhoids.  Objective:  Vital signs in last 24 hours:  Blood pressure 135/62, pulse 97, temperature 98.3 F (36.8 C), temperature source Oral, resp. rate 18, height 5' 7"  (1.702 m), weight 110 lb (49.896 kg), SpO2 100 %.    HEENT: No thrush or ulcers. Resp: Lungs clear bilaterally. Cardio: Regular rate and rhythm. GI: Left lower abdomen appears distended. She is markedly tender at the left lower abdomen. Bowel sounds hypoactive. No hepatomegaly. Scattered areas of nodular fullness over the abdominal wall. Vascular: No leg edema. Skin: Pale appearing. Port-A-Cath without erythema.    Lab Results:  Lab Results  Component Value Date   WBC 6.1 08/24/2014   HGB 9.7* 08/24/2014   HCT 30.6* 08/24/2014     MCV 83.8 08/24/2014   PLT 111* 08/24/2014   NEUTROABS 3.3 08/24/2014    Imaging:  Dg Abd Acute W/chest  08/24/2014   CLINICAL DATA:  Chemotherapy for colon cancer. Dehydration. Diffuse pain.  EXAM: DG ABDOMEN ACUTE W/ 1V CHEST  COMPARISON:  Abdominal CT 07/23/2014  FINDINGS: Chronic retention of stool above a sigmoid colon metallic stent. No evidence of high-grade obstruction or change from previous. No concerning intra-abdominal mass effect or calcification. Lung bases are clear.  IMPRESSION: Chronic retention of stool above of the patient's sigmoid colon stent. The stool appears desiccated and is likely resistant to passing through the stent. No high-grade obstruction or progression since 07/23/2014.   Electronically Signed   By: Monte Fantasia M.D.   On: 08/24/2014 06:51    Medications: I have reviewed the patient's current medications.  Assessment/Plan: 1. Rectal cancer  Obstructing mass at 10 cm from the anal verge noted on a colonoscopy 06/29/2014  CT chest, abdomen and pelvis 06/29/2014 revealed a partially obstructing rectosigmoid mass, widespread peritoneal metastatic disease in the pelvis, and diffuse liver metastases  Biopsy of a liver lesion 07/05/2014 confirmed metastatic adenocarcinoma, KRAS Q61H mutation  Cycle 1 FOLFOX 07/19/2014  Cycle 2 FOLFOX 08/02/2014  Cycle 3 FOLFOX 08/16/2014  2. Status Morrison placement of a rectal stent on 06/30/2014, placed on bowel rest and IV antibiotics secondary to a possible microperforation  3. Crohn's disease  4. Anemia secondary to rectal bleeding and multiple blood draws  5. Anorexia/weight loss  6. Fever-tumor fever?, Microperforation related to the rectal stent?-Improved  7. Status Morrison Port-A-Cath placement 07/04/2014  8. Left pneumothorax following the  Port-A-Cath placement, status Morrison chest tube placement 07/05/2014. Chest tube removed 07/08/2014.  9. Acute onset pain right upper abdomen 07/23/2014 status Morrison  emergency room evaluation. Likely secondary to liver metastases. Improved.  10. Delayed nausea following cycle 1 FOLFOX. Aloxi and Emend added with cycle 2. Prophylactic Decadron added with cycle 3    Disposition: Sydney Morrison has completed 3 cycles of FOLFOX. She presents today with persistent nausea, intermittent vomiting and pain/tenderness/distention at the left lower abdomen. She has had some chills and sweats, no documented fever. We are concerned for possible bowel perforation, abscess, obstruction. Dr. Benay Spice recommends hospitalization for further evaluation. Sydney Morrison is in agreement. Dr. Benay Spice has spoken with Dr. Charlies Silvers. She will be admitted to [facility].  Patient seen with Dr. Benay Spice. 30 minutes were spent face-to-face at today's visit with the majority of that time involved in counseling/coordination of care.  Ned Card ANP/GNP-BC   08/25/2014  3:08 PM  This was a shared visit with Ned Card. Sydney Morrison was interviewed and examined. She has persistent nausea/vomiting and abdominal pain. She reports chills.  I am concerned she has developed an intra-abdominal process such as a bowel perforation or abscess. There are atypical nodular areas throughout the abdomen, most prominent at the left lower abdomen with associated tenderness.  I contacted Dr. Charlies Silvers and she has agreed to admit Sydney Morrison for supportive care measures and additional diagnostic evaluation with a CT. Gastroenterology and the surgical service will be consulted as indicated. Dr. Charlies Silvers will contact oncology as needed over the weekend.  Julieanne Manson, M.D.

## 2014-08-25 NOTE — Telephone Encounter (Signed)
Called pt, she understands to come in to the office now to be worked in.

## 2014-08-26 ENCOUNTER — Inpatient Hospital Stay (HOSPITAL_COMMUNITY): Payer: 59

## 2014-08-26 DIAGNOSIS — K56609 Unspecified intestinal obstruction, unspecified as to partial versus complete obstruction: Secondary | ICD-10-CM | POA: Diagnosis present

## 2014-08-26 LAB — GLUCOSE, CAPILLARY: GLUCOSE-CAPILLARY: 122 mg/dL — AB (ref 65–99)

## 2014-08-26 MED ORDER — PHENOL 1.4 % MT LIQD
2.0000 | OROMUCOSAL | Status: DC | PRN
  Administered 2014-08-26 (×2): 2 via OROMUCOSAL
  Filled 2014-08-26: qty 177

## 2014-08-26 MED ORDER — NICOTINE 21 MG/24HR TD PT24
21.0000 mg | MEDICATED_PATCH | Freq: Every day | TRANSDERMAL | Status: DC
  Administered 2014-08-26 – 2014-09-20 (×26): 21 mg via TRANSDERMAL
  Filled 2014-08-26 (×26): qty 1

## 2014-08-26 NOTE — Progress Notes (Addendum)
Pt's nausea persisted through the night, relieved best with Reglan. NG tube placed based on CT abd/pelvis results per NP order. 727ml of bile/brown removed in 3 hours. Pt still with some slight nausea, KUB ordered by NP to assess if tube needs to be advanced further. Pt also had temp of 101 last night and then 103 this morning, both were brought down slowly after given tylenol suppository. NP on call also notified of pt's temp and nausea after NG tube. Currently awaiting KUB results to assess tube placement.   Elif Yonts, Julieta Gutting RN

## 2014-08-26 NOTE — Consult Note (Signed)
Reason for Consult: SBO Referring Physician: [name] is an 59 y.o. female.  HPI: 59 y.o. F with metastatic rectal cancer, s/p stent placement 1 month ago.  Pt states she is having BM's but has not been able to eat due to severe nausea.  Was seen earlier in the week for similar symptoms and given hydration and antiemetics.  Still unable to tolerate PO at home.  CT scan shows SBO.  Patient actively receiving chemotherapy.  NG placed in ED.  Pt with fevers overnight but no increase in abd pain.  Nausea relieved with NG placement.  Significant return noted on insertion.  Past Medical History  Diagnosis Date  . Crohn's disease   . Chronic headache   . Cancer     Past Surgical History  Procedure Laterality Date  . Breast surgery    . Uterine fibroid surgery    . Colonoscopy w/ biopsies  06/29/2014    DR HUNG  . Flexible sigmoidoscopy N/A 06/30/2014    Procedure: FLEXIBLE SIGMOIDOSCOPY;  Surgeon: Carol Ada, MD;  Location: [facility] ENDOSCOPY;  Service: Endoscopy;  Laterality: N/A;  . Colonic stent placement N/A 06/30/2014    Procedure: COLONIC STENT PLACEMENT;  Surgeon: Carol Ada, MD;  Location: [facility] ENDOSCOPY;  Service: Endoscopy;  Laterality: N/A;  with fluro   . Portacath placement N/A 07/04/2014    Procedure: POWER PORT PLACEMENT;  Surgeon: Alphonsa Overall, MD;  Location: [place];  Service: General;  Laterality: N/A;    Family History  Problem Relation Age of Onset  . Hypertension Mother   . Cancer Father     Social History:  reports that she quit smoking about 2 months ago. Her smoking use included Cigarettes. She has a 20 pack-year smoking history. She has never used smokeless tobacco. She reports that she does not drink alcohol or use illicit drugs.  Allergies:  Allergies  Allergen Reactions  . Avelox [Moxifloxacin Hcl In Nacl] Hives  . Codeine Hives  . Dextromethorphan Hives  . Erythromycin Hives  . Penicillins Hives  . Suprep [Na Sulfate-K Sulfate-Mg Sulf] Nausea And  Vomiting    Medications: I have reviewed the patient's current medications.  Results for orders placed or performed during the hospital encounter of 08/25/14 (from the past 48 hour(s))  Comprehensive metabolic panel     Status: Abnormal   Collection Time: 08/25/14  5:34 PM  Result Value Ref Range   Sodium 136 135 - 145 mmol/L   Potassium 4.1 3.5 - 5.1 mmol/L   Chloride 99 (L) 101 - 111 mmol/L   CO2 27 22 - 32 mmol/L   Glucose, Bld 104 (H) 65 - 99 mg/dL   BUN 29 (H) 6 - 20 mg/dL   Creatinine, Ser 0.61 0.44 - 1.00 mg/dL   Calcium 8.5 (L) 8.9 - 10.3 mg/dL   Total Protein 6.6 6.5 - 8.1 g/dL   Albumin 3.5 3.5 - 5.0 g/dL   AST 20 15 - 41 U/L   ALT 14 14 - 54 U/L   Alkaline Phosphatase 99 38 - 126 U/L   Total Bilirubin 0.6 0.3 - 1.2 mg/dL   GFR calc non Af Amer >60 >60 mL/min   GFR calc Af Amer >60 >60 mL/min    Comment: (NOTE) The eGFR has been calculated using the CKD EPI equation. This calculation has not been validated in all clinical situations. eGFR's persistently <60 mL/min signify possible Chronic Kidney Disease.    Anion gap 10 5 - 15  Magnesium  Status: None   Collection Time: 08/25/14  5:34 PM  Result Value Ref Range   Magnesium 2.1 1.7 - 2.4 mg/dL  Phosphorus     Status: None   Collection Time: 08/25/14  5:34 PM  Result Value Ref Range   Phosphorus 3.2 2.5 - 4.6 mg/dL  CBC WITH DIFFERENTIAL     Status: Abnormal   Collection Time: 08/25/14  5:34 PM  Result Value Ref Range   WBC 5.4 4.0 - 10.5 K/uL   RBC 3.14 (L) 3.87 - 5.11 MIL/uL   Hemoglobin 8.5 (L) 12.0 - 15.0 g/dL   HCT 26.2 (L) 36.0 - 46.0 %   MCV 83.4 78.0 - 100.0 fL   MCH 27.1 26.0 - 34.0 pg   MCHC 32.4 30.0 - 36.0 g/dL   RDW 16.6 (H) 11.5 - 15.5 %   Platelets 117 (L) 150 - 400 K/uL    Comment: REPEATED TO VERIFY SPECIMEN CHECKED FOR CLOTS PLATELET COUNT CONFIRMED BY SMEAR    Neutrophils Relative % 66 43 - 77 %   Lymphocytes Relative 17 12 - 46 %   Monocytes Relative 17 (H) 3 - 12 %    Eosinophils Relative 0 0 - 5 %   Basophils Relative 0 0 - 1 %   Neutro Abs 3.6 1.7 - 7.7 K/uL   Lymphs Abs 0.9 0.7 - 4.0 K/uL   Monocytes Absolute 0.9 0.1 - 1.0 K/uL   Eosinophils Absolute 0.0 0.0 - 0.7 K/uL   Basophils Absolute 0.0 0.0 - 0.1 K/uL   WBC Morphology INCREASED BANDS (>20% BANDS)   APTT     Status: None   Collection Time: 08/25/14  5:34 PM  Result Value Ref Range   aPTT 30 24 - 37 seconds  Protime-INR     Status: None   Collection Time: 08/25/14  5:34 PM  Result Value Ref Range   Prothrombin Time 15.2 11.6 - 15.2 seconds   INR 1.18 0.00 - 1.49  Glucose, capillary     Status: Abnormal   Collection Time: 08/26/14  7:36 AM  Result Value Ref Range   Glucose-Capillary 122 (H) 65 - 99 mg/dL    Ct Abdomen Pelvis Wo Contrast  08/26/2014   CLINICAL DATA:  Nausea and vomiting. Left lower quadrant pain for 3 days.  EXAM: CT ABDOMEN AND PELVIS WITHOUT CONTRAST  TECHNIQUE: Multidetector CT imaging of the abdomen and pelvis was performed following the standard protocol without IV contrast.  COMPARISON:  07/23/2014  FINDINGS: BODY WALL: No contributory findings.  LOWER CHEST: No acute findings  ABDOMEN/PELVIS:  Liver: Multi focal hepatic metastatic disease with faint mineralization. The masses appear decreased from comparison study, especially the dominant left lobe mass which now measures 6 cm (previously 9 cm).  Biliary: No evidence of biliary obstruction or stone.  Pancreas: Unremarkable.  Spleen: Unremarkable.  Adrenals: Unremarkable.  Kidneys and ureters: No hydronephrosis. Punctate left nephrolithiasis  Bladder: Unremarkable.  Bowel: There is a stably positioned stent in the sigmoid colon to upper rectum with unchanged surrounding bowel wall thickening and erosion of the poximal stent into the wall, likely reaching the serosa. Formed stool present throughout the colon. High-density material is seen within the noninflamed appendix. Major change is diffuse dilation of small bowel with fluid  levels, with transition point likely in the pelvis where there is peritoneal metastatic disease. No evidence of bowel necrosis or perforation  Peritoneum: Diffuse peritoneal metastases coating in the pelvic peritoneal recesses. Small ascites.  Vascular: No acute abnormality.  OSSEOUS:  No acute abnormalities.  IMPRESSION: 1. 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 2. Unchanged positioning of the distal colonic stent, including erosion into the colonic wall. Large volume of desiccated stool present above the stent. 3. Extensive hepatic metastatic disease. The largest metastasis in the left lobe has decreased from 07/23/2014.   Electronically Signed   By: Monte Fantasia M.D.   On: 08/26/2014 02:00   Dg Abd 1 View  08/26/2014   CLINICAL DATA:  Metastatic colon cancer. Followup bowel obstruction.  EXAM: ABDOMEN - 1 VIEW  COMPARISON:  08/25/2014.  FINDINGS: Position of the distal colonic stent is unchanged from 08/24/2014. Again noted are multiple dilated loops of small bowel consistent with bowel obstruction. The degree of bowel distention is not significantly improved from previous exam.  IMPRESSION: 1. No change in small bowel obstruction pattern.   Electronically Signed   By: Kerby Moors M.D.   On: 08/26/2014 09:22    Review of Systems  Constitutional: Positive for fever. Negative for chills.  HENT: Negative for hearing loss.   Eyes: Negative for blurred vision.  Respiratory: Negative for cough and shortness of breath.   Cardiovascular: Negative for chest pain and palpitations.  Gastrointestinal: Positive for nausea, vomiting, abdominal pain and diarrhea. Negative for blood in stool.  Genitourinary: Negative for dysuria, urgency and frequency.  Skin: Negative for rash.  Neurological: Negative for headaches.   Blood pressure 150/56, pulse 96, temperature 101.7 F (38.7 C), temperature source Oral, resp. rate 18, height 5' 7"  (1.702 m), weight 50.5 kg  (111 lb 5.3 oz), SpO2 97 %. Physical Exam  Constitutional: She is oriented to person, place, and time. She appears well-developed and well-nourished. No distress.  HENT:  Head: Normocephalic and atraumatic.  Eyes: Conjunctivae are normal. Pupils are equal, round, and reactive to light.  Neck: Normal range of motion. Neck supple.  Cardiovascular: Normal rate and regular rhythm.   Respiratory: Effort normal and breath sounds normal.  GI: Soft. She exhibits distension.  Mild TTP lower abd  Musculoskeletal: Normal range of motion.  Neurological: She is alert and oriented to person, place, and time.  Skin: Skin is warm and dry. She is not diaphoretic.    Assessment/Plan: 59 y.o. F with metastatic rectal cancer.  SBO seen on CT scan.  Contrast in colon from last CT.  This is concerning for stent malfunction and possible partial blockage.  Doesn't appear to be completely blocked since pt states she is having BM's.  Would consider GI consult to Dr Benson Norway to evaluate, if not done already as outpatient.  Cont NG decompression, IV fluid resuscitation.  Fever work up in progress.  Pt is a poor operative candidate.    Sydney Morrison C. 11/07/9831, 82:50 AM

## 2014-08-26 NOTE — Progress Notes (Signed)
NG tube advanced 10cm as per order. Pt tolerated procedure well. Lind Guest, RN

## 2014-08-26 NOTE — Progress Notes (Deleted)
HS CBG noted. Pt received 70/30 insulin late from day and just finished eating d/t late order. CBG was greatly improved from dinner time. Will continue to monitor.

## 2014-08-26 NOTE — Progress Notes (Signed)
[facility] PROGRESS NOTE  Sydney Morrison LGX:[id number] DOB: 30-Nov-1955 DOA: 08/25/2014 PCP: Delphina Cahill, MD  Assessment/Plan: Nausea vomiting and ad=bdominal discomfort from abdominal distention: possibly from SBO, . Ng tube placed and surgery consulted. She reports one small BM earlier this am. Not passing flatus. Recommend OOB to chair. Plan to repeat abdominal film in am.  Symptomatic management with IV fluids, IV antiemetics.  Blood cultures done and pending.    Fevers SIRS:  Empirically treating with primaxin and flagyl. CT does not show any perforation.   Rectal cancer with liever mets: Follow up with Dr Learta Codding as recommended.   Anemia and thrombocytopenia: Appears to be stable.      Code Status: full code Family Communication: none at bedside Disposition Plan: pending further work up.    Consultants:  surgery  Procedures:  NG tube placement.   Antibiotics:  primaxin and flagyl  HPI/Subjective: Feeling much better.   Objective: Filed Vitals:   08/26/14 1538  BP:   Pulse:   Temp: 100.6 F (38.1 C)  Resp:     Intake/Output Summary (Last 24 hours) at 08/26/14 1601 Last data filed at 08/26/14 1537  Gross per 24 hour  Intake 2376.25 ml  Output   1875 ml  Net 501.25 ml   Filed Weights   08/25/14 1500 08/26/14 0645  Weight: 49.578 kg (109 lb 4.8 oz) 50.5 kg (111 lb 5.3 oz)    Exam:   General:  Alert comfortable  Cardiovascular: s1s2  Respiratory: ctab, no wheezing heard  Abdomen: soft distended , mild discomfort, no Bowel sounds heard  Musculoskeletal: no pedal edema.  Data Reviewed: Basic Metabolic Panel:  Recent Labs Lab 08/24/14 0510 08/25/14 1734  NA 136 136  K 4.0 4.1  CL 100* 99*  CO2 26 27  GLUCOSE 117* 104*  BUN 26* 29*  CREATININE 0.58 0.61  CALCIUM 9.4 8.5*  MG  --  2.1  PHOS  --  3.2   Liver Function Tests:  Recent Labs Lab 08/24/14 0510 08/25/14 1734  AST 23 20  ALT 15 14  ALKPHOS 124 99   BILITOT 0.5 0.6  PROT 7.8 6.6  ALBUMIN 4.2 3.5    Recent Labs Lab 08/24/14 0510  LIPASE 30   No results for input(s): AMMONIA in the last 168 hours. CBC:  Recent Labs Lab 08/24/14 0510 08/25/14 1734  WBC 6.1 5.4  NEUTROABS 3.3 3.6  HGB 9.7* 8.5*  HCT 30.6* 26.2*  MCV 83.8 83.4  PLT 111* 117*   Cardiac Enzymes: No results for input(s): CKTOTAL, CKMB, CKMBINDEX, TROPONINI in the last 168 hours. BNP (last 3 results) No results for input(s): BNP in the last 8760 hours.  ProBNP (last 3 results) No results for input(s): PROBNP in the last 8760 hours.  CBG:  Recent Labs Lab 08/26/14 0736  GLUCAP 122*    No results found for this or any previous visit (from the past 240 hour(s)).   Studies: Ct Abdomen Pelvis Wo Contrast  08/26/2014   CLINICAL DATA:  Nausea and vomiting. Left lower quadrant pain for 3 days.  EXAM: CT ABDOMEN AND PELVIS WITHOUT CONTRAST  TECHNIQUE: Multidetector CT imaging of the abdomen and pelvis was performed following the standard protocol without IV contrast.  COMPARISON:  07/23/2014  FINDINGS: BODY WALL: No contributory findings.  LOWER CHEST: No acute findings  ABDOMEN/PELVIS:  Liver: Multi focal hepatic metastatic disease with faint mineralization. The masses appear decreased from comparison study, especially the dominant left lobe mass which now measures  6 cm (previously 9 cm).  Biliary: No evidence of biliary obstruction or stone.  Pancreas: Unremarkable.  Spleen: Unremarkable.  Adrenals: Unremarkable.  Kidneys and ureters: No hydronephrosis. Punctate left nephrolithiasis  Bladder: Unremarkable.  Bowel: There is a stably positioned stent in the sigmoid colon to upper rectum with unchanged surrounding bowel wall thickening and erosion of the poximal stent into the wall, likely reaching the serosa. Formed stool present throughout the colon. High-density material is seen within the noninflamed appendix. Major change is diffuse dilation of small bowel with  fluid levels, with transition point likely in the pelvis where there is peritoneal metastatic disease. No evidence of bowel necrosis or perforation  Peritoneum: Diffuse peritoneal metastases coating in the pelvic peritoneal recesses. Small ascites.  Vascular: No acute abnormality.  OSSEOUS: No acute abnormalities.  IMPRESSION: 1. 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 2. Unchanged positioning of the distal colonic stent, including erosion into the colonic wall. Large volume of desiccated stool present above the stent. 3. Extensive hepatic metastatic disease. The largest metastasis in the left lobe has decreased from 07/23/2014.   Electronically Signed   By: Monte Fantasia M.D.   On: 08/26/2014 02:00   Dg Abd 1 View  08/26/2014   CLINICAL DATA:  Metastatic colon cancer. Followup bowel obstruction.  EXAM: ABDOMEN - 1 VIEW  COMPARISON:  08/25/2014.  FINDINGS: Position of the distal colonic stent is unchanged from 08/24/2014. Again noted are multiple dilated loops of small bowel consistent with bowel obstruction. The degree of bowel distention is not significantly improved from previous exam.  IMPRESSION: 1. No change in small bowel obstruction pattern.   Electronically Signed   By: Kerby Moors M.D.   On: 08/26/2014 09:22    Scheduled Meds: . antiseptic oral rinse  7 mL Mouth Rinse BID  . imipenem-cilastatin  250 mg Intravenous Q6H  . metoCLOPramide (REGLAN) injection  5 mg Intravenous 3 times per day  . metronidazole  500 mg Intravenous Q8H  . nicotine  21 mg Transdermal Daily  . promethazine  25 mg Intravenous Q6H  . sodium chloride  10-40 mL Intracatheter Q12H  . sodium chloride  3 mL Intravenous Q12H   Continuous Infusions: . sodium chloride 75 mL/hr at 08/26/14 0932    Active Problems:   Nausea and vomiting    Time spent: 25 minutes    [place] [facility] Pager [phone]. If 7PM-7AM, please contact night-coverage at  [email or web address], password [facility] 08/26/2014, 4:01 PM  LOS: 1 day

## 2014-08-27 LAB — CBC
HEMATOCRIT: 29.3 % — AB (ref 36.0–46.0)
HEMOGLOBIN: 9.2 g/dL — AB (ref 12.0–15.0)
MCH: 26.5 pg (ref 26.0–34.0)
MCHC: 31.4 g/dL (ref 30.0–36.0)
MCV: 84.4 fL (ref 78.0–100.0)
PLATELETS: 124 10*3/uL — AB (ref 150–400)
RBC: 3.47 MIL/uL — ABNORMAL LOW (ref 3.87–5.11)
RDW: 17.9 % — AB (ref 11.5–15.5)
WBC: 2.3 10*3/uL — ABNORMAL LOW (ref 4.0–10.5)

## 2014-08-27 LAB — BASIC METABOLIC PANEL
Anion gap: 14 (ref 5–15)
BUN: 41 mg/dL — ABNORMAL HIGH (ref 6–20)
CALCIUM: 8.8 mg/dL — AB (ref 8.9–10.3)
CO2: 22 mmol/L (ref 22–32)
Chloride: 103 mmol/L (ref 101–111)
Creatinine, Ser: 0.75 mg/dL (ref 0.44–1.00)
GFR calc Af Amer: >60 mL/min (ref >60)
GFR calc non Af Amer: >60 mL/min (ref >60)
Glucose, Bld: 124 mg/dL — ABNORMAL HIGH (ref 65–99)
Potassium: 3.2 mmol/L — ABNORMAL LOW (ref 3.5–5.1)
Sodium: 139 mmol/L (ref 135–145)

## 2014-08-27 LAB — GLUCOSE, CAPILLARY: Glucose-Capillary: 133 mg/dL — ABNORMAL HIGH (ref 65–99)

## 2014-08-27 MED ORDER — PANTOPRAZOLE SODIUM 40 MG IV SOLR
40.0000 mg | Freq: Every day | INTRAVENOUS | Status: DC
  Administered 2014-08-27 – 2014-09-13 (×19): 40 mg via INTRAVENOUS
  Filled 2014-08-27 (×20): qty 40

## 2014-08-27 MED ORDER — POTASSIUM CHLORIDE 10 MEQ/100ML IV SOLN
10.0000 meq | INTRAVENOUS | Status: AC
  Administered 2014-08-27 (×2): 10 meq via INTRAVENOUS
  Filled 2014-08-27 (×2): qty 100

## 2014-08-27 NOTE — Progress Notes (Signed)
Flushed NG tube wih 60 cc and it came back clear. Bloody bile has stopped for now. Protonix order was written by Donnal Debar. Will continue to monitor.

## 2014-08-27 NOTE — Progress Notes (Signed)
[facility] PROGRESS NOTE  DANILLE OPPEDISANO ZMO:[id number] DOB: [birth year] DOA: 08/25/2014 PCP: Delphina Cahill, MD  Assessment/Plan: Nausea vomiting and abdominal discomfort from abdominal distention: possibly from SBO, . Ng tube placed and surgery consulted. She reports two small BM earlier this am. Not passing flatus. Recommend OOB to chair. Plan to repeat abdominal film in am.  Symptomatic management with IV fluids, IV antiemetics.  NG suction revealed some blood tinged fluid, and protonix ordered.  Blood cultures done and pending.    Fevers SIRS:  Empirically treating with primaxin and flagyl. CT does not show any perforation. D/c flagyl .   Rectal cancer with liever mets: Follow up with Dr Learta Codding as recommended.   Anemia and thrombocytopenia: Appears to be stable.   Hypokalemia replete as needed.    Code Status: full code Family Communication: none at bedside Disposition Plan: pending further work up.    Consultants:  surgery  Procedures:  NG tube placement.   Antibiotics:  primaxin   HPI/Subjective: Feeling the same as yesterday. abd pain has improved.   Objective: Filed Vitals:   08/27/14 1349  BP: 124/70  Pulse: 97  Temp: 98.9 F (37.2 C)  Resp: 18    Intake/Output Summary (Last 24 hours) at 08/27/14 1448 Last data filed at 08/27/14 0900  Gross per 24 hour  Intake 2356.25 ml  Output   2350 ml  Net   6.25 ml   Filed Weights   08/25/14 1500 08/26/14 0645 08/27/14 0616  Weight: 49.578 kg (109 lb 4.8 oz) 50.5 kg (111 lb 5.3 oz) 47.1 kg (103 lb 13.4 oz)    Exam:   General:  Alert comfortable  Cardiovascular: s1s2  Respiratory: ctab, no wheezing heard  Abdomen: soft distended , mild discomfort, no Bowel sounds heard  Musculoskeletal: no pedal edema.  Data Reviewed: Basic Metabolic Panel:  Recent Labs Lab 08/24/14 0510 08/25/14 1734 08/27/14 1020  NA 136 136 139  K 4.0 4.1 3.2*  CL 100* 99* 103  CO2 26 27 22   GLUCOSE 117* 104*  124*  BUN 26* 29* 41*  CREATININE 0.58 0.61 0.75  CALCIUM 9.4 8.5* 8.8*  MG  --  2.1  --   PHOS  --  3.2  --    Liver Function Tests:  Recent Labs Lab 08/24/14 0510 08/25/14 1734  AST 23 20  ALT 15 14  ALKPHOS 124 99  BILITOT 0.5 0.6  PROT 7.8 6.6  ALBUMIN 4.2 3.5    Recent Labs Lab 08/24/14 0510  LIPASE 30   No results for input(s): AMMONIA in the last 168 hours. CBC:  Recent Labs Lab 08/24/14 0510 08/25/14 1734 08/27/14 1020  WBC 6.1 5.4 2.3*  NEUTROABS 3.3 3.6  --   HGB 9.7* 8.5* 9.2*  HCT 30.6* 26.2* 29.3*  MCV 83.8 83.4 84.4  PLT 111* 117* 124*   Cardiac Enzymes: No results for input(s): CKTOTAL, CKMB, CKMBINDEX, TROPONINI in the last 168 hours. BNP (last 3 results) No results for input(s): BNP in the last 8760 hours.  ProBNP (last 3 results) No results for input(s): PROBNP in the last 8760 hours.  CBG:  Recent Labs Lab 08/26/14 0736 08/27/14 0744  GLUCAP 122* 133*    Recent Results (from the past 240 hour(s))  Culture, blood (routine x 2)     Status: None (Preliminary result)   Collection Time: 08/26/14  7:05 AM  Result Value Ref Range Status   Specimen Description BLOOD RIGHT ARM  Final   Special Requests  Final    BOTTLES DRAWN AEROBIC AND ANAEROBIC 10CC BOTH BOTTLES   Culture   Final           BLOOD CULTURE RECEIVED NO GROWTH TO DATE CULTURE WILL BE HELD FOR 5 DAYS BEFORE ISSUING A FINAL NEGATIVE REPORT Performed at [facility]    Report Status PENDING  Incomplete  Culture, blood (routine x 2)     Status: None (Preliminary result)   Collection Time: 08/26/14  7:10 AM  Result Value Ref Range Status   Specimen Description BLOOD LEFT ARM  Final   Special Requests   Final    BOTTLES DRAWN AEROBIC AND ANAEROBIC 10CC BOTH BOTTLES   Culture   Final           BLOOD CULTURE RECEIVED NO GROWTH TO DATE CULTURE WILL BE HELD FOR 5 DAYS BEFORE ISSUING A FINAL NEGATIVE REPORT Performed at [facility]    Report Status PENDING   Incomplete     Studies: Ct Abdomen Pelvis Wo Contrast  08/26/2014   CLINICAL DATA:  Nausea and vomiting. Left lower quadrant pain for 3 days.  EXAM: CT ABDOMEN AND PELVIS WITHOUT CONTRAST  TECHNIQUE: Multidetector CT imaging of the abdomen and pelvis was performed following the standard protocol without IV contrast.  COMPARISON:  07/23/2014  FINDINGS: BODY WALL: No contributory findings.  LOWER CHEST: No acute findings  ABDOMEN/PELVIS:  Liver: Multi focal hepatic metastatic disease with faint mineralization. The masses appear decreased from comparison study, especially the dominant left lobe mass which now measures 6 cm (previously 9 cm).  Biliary: No evidence of biliary obstruction or stone.  Pancreas: Unremarkable.  Spleen: Unremarkable.  Adrenals: Unremarkable.  Kidneys and ureters: No hydronephrosis. Punctate left nephrolithiasis  Bladder: Unremarkable.  Bowel: There is a stably positioned stent in the sigmoid colon to upper rectum with unchanged surrounding bowel wall thickening and erosion of the poximal stent into the wall, likely reaching the serosa. Formed stool present throughout the colon. High-density material is seen within the noninflamed appendix. Major change is diffuse dilation of small bowel with fluid levels, with transition point likely in the pelvis where there is peritoneal metastatic disease. No evidence of bowel necrosis or perforation  Peritoneum: Diffuse peritoneal metastases coating in the pelvic peritoneal recesses. Small ascites.  Vascular: No acute abnormality.  OSSEOUS: No acute abnormalities.  IMPRESSION: 1. 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 2. Unchanged positioning of the distal colonic stent, including erosion into the colonic wall. Large volume of desiccated stool present above the stent. 3. Extensive hepatic metastatic disease. The largest metastasis in the left lobe has decreased from 07/23/2014.   Electronically Signed    By: Monte Fantasia M.D.   On: 08/26/2014 02:00   Dg Abd 1 View  08/26/2014   CLINICAL DATA:  Metastatic colon cancer. Followup bowel obstruction.  EXAM: ABDOMEN - 1 VIEW  COMPARISON:  08/25/2014.  FINDINGS: Position of the distal colonic stent is unchanged from 08/24/2014. Again noted are multiple dilated loops of small bowel consistent with bowel obstruction. The degree of bowel distention is not significantly improved from previous exam.  IMPRESSION: 1. No change in small bowel obstruction pattern.   Electronically Signed   By: Kerby Moors M.D.   On: 08/26/2014 09:22    Scheduled Meds: . antiseptic oral rinse  7 mL Mouth Rinse BID  . imipenem-cilastatin  250 mg Intravenous Q6H  . metoCLOPramide (REGLAN) injection  5 mg Intravenous 3 times per day  .  nicotine  21 mg Transdermal Daily  . pantoprazole (PROTONIX) IV  40 mg Intravenous QHS  . promethazine  25 mg Intravenous Q6H  . sodium chloride  10-40 mL Intracatheter Q12H  . sodium chloride  3 mL Intravenous Q12H   Continuous Infusions: . sodium chloride 75 mL/hr at 08/26/14 2324    Active Problems:   Fever   Liver lesion   Dehydration   Nausea and vomiting   SBO (small bowel obstruction)    Time spent: 25 minutes    [place] [facility] Pager [phone]. If 7PM-7AM, please contact night-coverage at [email or web address], password [facility] 08/27/2014, 2:48 PM  LOS: 2 days

## 2014-08-27 NOTE — Progress Notes (Signed)
Patient ID: Sydney Morrison, female   DOB: 10/31/1955, 59 y.o.   MRN: [id number]    Subjective: Feels essentially the same. Still with some abdominal pain. Has had a couple of bowel movements which she has had throughout her illness. Feels nauseated even with NG tube but no vomiting.  Objective: Vital signs in last 24 hours: Temp:  [98.6 F (37 C)-101.9 F (38.8 C)] 98.6 F (37 C) (05/29 0616) Pulse Rate:  [92-103] 103 (05/29 0616) Resp:  [18] 18 (05/29 0616) BP: (133-143)/(51-71) 133/64 mmHg (05/29 0616) SpO2:  [98 %] 98 % (05/29 0616) Weight:  [47.1 kg (103 lb 13.4 oz)] 47.1 kg (103 lb 13.4 oz) (05/29 0616) Last BM Date: 08/25/14  Intake/Output from previous day: 05/28 0701 - 05/29 0700 In: 2556.3 [I.V.:1856.3; IV Piggyback:700] Out: 2350 [Emesis/NG output:2350] Intake/Output this shift:    General appearance: alert, cooperative and very thin and chronically ill-appearing GI: mild distention. Soft. Mild tenderness without guarding in the lower abdomen particularly in the left lower quadrant. Questionable mass versus stool palpable left lower quadrant.  Lab Results:   Recent Labs  08/25/14 1734  WBC 5.4  HGB 8.5*  HCT 26.2*  PLT 117*   BMET  Recent Labs  08/25/14 1734  NA 136  K 4.1  CL 99*  CO2 27  GLUCOSE 104*  BUN 29*  CREATININE 0.61  CALCIUM 8.5*     Studies/Results: Ct Abdomen Pelvis Wo Contrast  08/26/2014   CLINICAL DATA:  Nausea and vomiting. Left lower quadrant pain for 3 days.  EXAM: CT ABDOMEN AND PELVIS WITHOUT CONTRAST  TECHNIQUE: Multidetector CT imaging of the abdomen and pelvis was performed following the standard protocol without IV contrast.  COMPARISON:  07/23/2014  FINDINGS: BODY WALL: No contributory findings.  LOWER CHEST: No acute findings  ABDOMEN/PELVIS:  Liver: Multi focal hepatic metastatic disease with faint mineralization. The masses appear decreased from comparison study, especially the dominant left lobe mass which now measures 6  cm (previously 9 cm).  Biliary: No evidence of biliary obstruction or stone.  Pancreas: Unremarkable.  Spleen: Unremarkable.  Adrenals: Unremarkable.  Kidneys and ureters: No hydronephrosis. Punctate left nephrolithiasis  Bladder: Unremarkable.  Bowel: There is a stably positioned stent in the sigmoid colon to upper rectum with unchanged surrounding bowel wall thickening and erosion of the poximal stent into the wall, likely reaching the serosa. Formed stool present throughout the colon. High-density material is seen within the noninflamed appendix. Major change is diffuse dilation of small bowel with fluid levels, with transition point likely in the pelvis where there is peritoneal metastatic disease. No evidence of bowel necrosis or perforation  Peritoneum: Diffuse peritoneal metastases coating in the pelvic peritoneal recesses. Small ascites.  Vascular: No acute abnormality.  OSSEOUS: No acute abnormalities.  IMPRESSION: 1. 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 2. Unchanged positioning of the distal colonic stent, including erosion into the colonic wall. Large volume of desiccated stool present above the stent. 3. Extensive hepatic metastatic disease. The largest metastasis in the left lobe has decreased from 07/23/2014.   Electronically Signed   By: Monte Fantasia M.D.   On: 08/26/2014 02:00   Dg Abd 1 View  08/26/2014   CLINICAL DATA:  Metastatic colon cancer. Followup bowel obstruction.  EXAM: ABDOMEN - 1 VIEW  COMPARISON:  08/25/2014.  FINDINGS: Position of the distal colonic stent is unchanged from 08/24/2014. Again noted are multiple dilated loops of small bowel consistent with bowel obstruction. The degree of  bowel distention is not significantly improved from previous exam.  IMPRESSION: 1. No change in small bowel obstruction pattern.   Electronically Signed   By: Kerby Moors M.D.   On: 08/26/2014 09:22    Anti-infectives: Anti-infectives     Start     Dose/Rate Route Frequency Ordered Stop   08/25/14 1800  imipenem-cilastatin (PRIMAXIN) 250 mg in sodium chloride 0.9 % 100 mL IVPB     250 mg 200 mL/hr over 30 Minutes Intravenous Every 6 hours 08/25/14 1716     08/25/14 1800  metroNIDAZOLE (FLAGYL) IVPB 500 mg     500 mg 100 mL/hr over 60 Minutes Intravenous Every 8 hours 08/25/14 1716        Assessment/Plan: Metastatic rectal cancer with stent in place and rectum and recent chemotherapy. Presents with small bowel obstruction likely secondary to pelvic tumor by CT Fever with recent chemotherapy, none last 24 hours Would obviously be poor operative candidate but could require laparotomy if she fails to improve. Continue bowel rest with NG suction. Repeat x-rays in a.m. Situation and plan discussed in detail with the patient and her husband.    LOS: 2 days    Layman Gully T 08/27/2014

## 2014-08-27 NOTE — Progress Notes (Signed)
NG tube was putting out bile/brown output, but recently it has become more blood tinged. Paged hospitalis to let them know. Will continue to monitor.

## 2014-08-27 NOTE — Progress Notes (Signed)
Initial Nutrition Assessment  DOCUMENTATION CODES:  Severe malnutrition in context of chronic illness, Underweight  INTERVENTION:  Diet advancement per MD If pt is to remain NPO > 7 days, recommend nutrition support RD to continue to monitor  NUTRITION DIAGNOSIS:  Malnutrition related to chronic illness as evidenced by 18 percent weight loss, severe depletion of body fat, severe depletion of muscle mass.  GOAL:  Patient will meet greater than or equal to 90% of their needs  MONITOR:  Diet advancement, Labs, Weight trends, Skin, I & O's  REASON FOR ASSESSMENT:  Malnutrition Screening Tool    ASSESSMENT: 59 year old female with past medical history significant for rectal cancer with liver metastasis, rectal stent placement in 06/2014 (under Dr. Gearldine Shown care), status post 3 cycles of chemotherapy, last cycle completed 08/16/2014. She started to develop nausea and vomiting over past 24 hours prior to this admission.  Pt followed by [facility], last seen 5/18. Pt NPO for bowel rest d/t SBO. Pt states that she has not had anything by mouth x 5 days. Pt with NGT for suction, output 1000 ml. Recommend nutrition support if bowel rest to last > 7 days. Pt at risk for refeeding risk d/t severe malnutrition and poor PO intake PTA.  Pt with 22 lb weight loss since 3/02 (18% weight loss x 3 months, significant for time frame).   Labs reviewed:  Low K Elevated BUN  Height:  Ht Readings from Last 1 Encounters:  08/25/14 5\' 7"  (1.702 m)    Weight:  Wt Readings from Last 1 Encounters:  08/27/14 103 lb 13.4 oz (47.1 kg)    Ideal Body Weight:  61.3 kg  Wt Readings from Last 10 Encounters:  08/27/14 103 lb 13.4 oz (47.1 kg)  08/25/14 110 lb (49.896 kg)  08/24/14 109 lb 12.8 oz (49.805 kg)  08/24/14 110 lb (49.896 kg)  08/16/14 110 lb 11.2 oz (50.213 kg)  08/02/14 113 lb (51.256 kg)  07/23/14 114 lb 5 oz (51.852 kg)  07/14/14 118 lb 14.4 oz (53.933 kg)  06/29/14  120 lb (54.432 kg)  05/31/14 125 lb (56.7 kg)    BMI:  Body mass index is 16.26 kg/(m^2).  Estimated Nutritional Needs:  Kcal:  1600-1800  Protein:  70-80g  Fluid:  1.6L/day    Skin:  Reviewed, no issues  Diet Order:  Diet NPO time specified  EDUCATION NEEDS:  No education needs identified at this time   Intake/Output Summary (Last 24 hours) at 08/27/14 1226 Last data filed at 08/27/14 0900  Gross per 24 hour  Intake 2356.25 ml  Output   2350 ml  Net   6.25 ml    Last BM:  5/27  Sydney Bibles, MS, RD, LDN Pager: [phone] After Hours Pager: [phone]

## 2014-08-28 ENCOUNTER — Inpatient Hospital Stay (HOSPITAL_COMMUNITY): Payer: 59

## 2014-08-28 ENCOUNTER — Other Ambulatory Visit: Payer: Self-pay

## 2014-08-28 DIAGNOSIS — K5669 Other intestinal obstruction: Secondary | ICD-10-CM

## 2014-08-28 DIAGNOSIS — C20 Malignant neoplasm of rectum: Secondary | ICD-10-CM

## 2014-08-28 DIAGNOSIS — D61810 Antineoplastic chemotherapy induced pancytopenia: Secondary | ICD-10-CM

## 2014-08-28 DIAGNOSIS — C786 Secondary malignant neoplasm of retroperitoneum and peritoneum: Secondary | ICD-10-CM

## 2014-08-28 DIAGNOSIS — C787 Secondary malignant neoplasm of liver and intrahepatic bile duct: Secondary | ICD-10-CM

## 2014-08-28 LAB — BASIC METABOLIC PANEL
Anion gap: 15 (ref 5–15)
BUN: 50 mg/dL — AB (ref 6–20)
CALCIUM: 8.9 mg/dL (ref 8.9–10.3)
CO2: 22 mmol/L (ref 22–32)
CREATININE: 0.64 mg/dL (ref 0.44–1.00)
Chloride: 108 mmol/L (ref 101–111)
Glucose, Bld: 103 mg/dL — ABNORMAL HIGH (ref 65–99)
Potassium: 3.1 mmol/L — ABNORMAL LOW (ref 3.5–5.1)
Sodium: 145 mmol/L (ref 135–145)

## 2014-08-28 LAB — COMPREHENSIVE METABOLIC PANEL
ALBUMIN: 3.2 g/dL — AB (ref 3.5–5.0)
ALT: 19 U/L (ref 14–54)
AST: 22 U/L (ref 15–41)
Alkaline Phosphatase: 75 U/L (ref 38–126)
Anion gap: 14 (ref 5–15)
BUN: 47 mg/dL — AB (ref 6–20)
CO2: 20 mmol/L — AB (ref 22–32)
Calcium: 8.6 mg/dL — ABNORMAL LOW (ref 8.9–10.3)
Chloride: 110 mmol/L (ref 101–111)
Creatinine, Ser: 0.61 mg/dL (ref 0.44–1.00)
GFR calc Af Amer: >60 mL/min (ref >60)
Glucose, Bld: 105 mg/dL — ABNORMAL HIGH (ref 65–99)
Potassium: 2.8 mmol/L — ABNORMAL LOW (ref 3.5–5.1)
Sodium: 144 mmol/L (ref 135–145)
Total Bilirubin: 0.9 mg/dL (ref 0.3–1.2)
Total Protein: 6.6 g/dL (ref 6.5–8.1)

## 2014-08-28 LAB — MAGNESIUM: MAGNESIUM: 2.2 mg/dL (ref 1.7–2.4)

## 2014-08-28 LAB — GLUCOSE, CAPILLARY: Glucose-Capillary: 102 mg/dL — ABNORMAL HIGH (ref 65–99)

## 2014-08-28 LAB — TROPONIN I: Troponin I: 0.04 ng/mL — ABNORMAL HIGH (ref <0.031)

## 2014-08-28 MED ORDER — SODIUM CHLORIDE 0.9 % IV BOLUS (SEPSIS)
1000.0000 mL | Freq: Once | INTRAVENOUS | Status: AC
  Administered 2014-08-28: 1000 mL via INTRAVENOUS

## 2014-08-28 MED ORDER — NITROGLYCERIN 0.3 MG SL SUBL
0.3000 mg | SUBLINGUAL_TABLET | SUBLINGUAL | Status: DC | PRN
  Filled 2014-08-28: qty 100

## 2014-08-28 MED ORDER — METOPROLOL TARTRATE 25 MG PO TABS
12.5000 mg | ORAL_TABLET | Freq: Once | ORAL | Status: AC
  Administered 2014-08-28: 12.5 mg via ORAL
  Filled 2014-08-28: qty 1

## 2014-08-28 MED ORDER — POTASSIUM CHLORIDE 10 MEQ/100ML IV SOLN
10.0000 meq | INTRAVENOUS | Status: AC
  Administered 2014-08-28 – 2014-08-29 (×4): 10 meq via INTRAVENOUS
  Filled 2014-08-28: qty 100

## 2014-08-28 MED ORDER — POTASSIUM CHLORIDE 10 MEQ/100ML IV SOLN: 10.0000 meq | INTRAVENOUS | Status: DC

## 2014-08-28 MED ORDER — ASPIRIN 81 MG PO CHEW
81.0000 mg | CHEWABLE_TABLET | Freq: Every day | ORAL | Status: DC
  Administered 2014-08-28: 81 mg via ORAL
  Filled 2014-08-28 (×3): qty 1

## 2014-08-28 MED ORDER — POTASSIUM CHLORIDE CRYS ER 20 MEQ PO TBCR
40.0000 meq | EXTENDED_RELEASE_TABLET | Freq: Two times a day (BID) | ORAL | Status: DC
  Filled 2014-08-28: qty 2

## 2014-08-28 MED ORDER — NITROGLYCERIN 0.4 MG SL SUBL: 0.4000 mg | SUBLINGUAL_TABLET | SUBLINGUAL | Status: DC | PRN

## 2014-08-28 MED ORDER — POTASSIUM CHLORIDE 20 MEQ PO PACK: 40.0000 meq | PACK | Freq: Two times a day (BID) | ORAL | Status: DC

## 2014-08-28 NOTE — Progress Notes (Signed)
Dr has been notified of pt's HR 150's and up to the 180 for a few minutes. Pt is asymptomatic. 12 Lead EKG done

## 2014-08-28 NOTE — Progress Notes (Signed)
[facility] PROGRESS NOTE  [name] MWU:[id number] DOB: 1955/06/02 DOA: 08/25/2014 PCP: Delphina Cahill, MD  Assessment/Plan: Nausea vomiting and abdominal discomfort from abdominal distention: possibly from SBO, . Ng tube placed and surgery consulted. She reports two small BM earlier this am. abd films show improving sbo. NG tube taken out and started on clear liquid diet. Symptomatic management with IV fluids, IV antiemetics.   Blood cultures done and pending.    Fevers SIRS:  Empirically treating with primaxin and flagyl. CT does not show any perforation. D/c flagyl . Recommend continue primaxin for 24 hours more and stop it if blood cultures aren egative.   Rectal cancer with liever mets: Follow up with Dr Learta Codding as recommended.   Anemia and thrombocytopenia: Appears to be stable.   Hypokalemia replete as needed.    Code Status: full code Family Communication: none at bedside Disposition Plan: pending further work up.    Consultants:  surgery  Procedures:  NG tube placement.   Antibiotics:  primaxin   HPI/Subjective: Nausea and vomiting has improved. Marland Kitchen abd pain has improved.   Objective: Filed Vitals:   08/28/14 0816  BP: 133/73  Pulse:   Temp:   Resp:     Intake/Output Summary (Last 24 hours) at 08/28/14 1445 Last data filed at 08/28/14 0615  Gross per 24 hour  Intake 2496.25 ml  Output   2000 ml  Net 496.25 ml   Filed Weights   08/26/14 0645 08/27/14 0616 08/28/14 0358  Weight: 50.5 kg (111 lb 5.3 oz) 47.1 kg (103 lb 13.4 oz) 44.3 kg (97 lb 10.6 oz)    Exam:   General:  Alert comfortable  Cardiovascular: s1s2  Respiratory: ctab, no wheezing heard  Abdomen: soft distended , mild discomfort, no Bowel sounds heard  Musculoskeletal: no pedal edema.  Data Reviewed: Basic Metabolic Panel:  Recent Labs Lab 08/24/14 0510 08/25/14 1734 08/27/14 1020 08/28/14 1020  NA 136 136 139 145  K 4.0 4.1 3.2* 3.1*  CL 100* 99* 103 108   CO2 26 27 22 22   GLUCOSE 117* 104* 124* 103*  BUN 26* 29* 41* 50*  CREATININE 0.58 0.61 0.75 0.64  CALCIUM 9.4 8.5* 8.8* 8.9  MG  --  2.1  --   --   PHOS  --  3.2  --   --    Liver Function Tests:  Recent Labs Lab 08/24/14 0510 08/25/14 1734  AST 23 20  ALT 15 14  ALKPHOS 124 99  BILITOT 0.5 0.6  PROT 7.8 6.6  ALBUMIN 4.2 3.5    Recent Labs Lab 08/24/14 0510  LIPASE 30   No results for input(s): AMMONIA in the last 168 hours. CBC:  Recent Labs Lab 08/24/14 0510 08/25/14 1734 08/27/14 1020  WBC 6.1 5.4 2.3*  NEUTROABS 3.3 3.6  --   HGB 9.7* 8.5* 9.2*  HCT 30.6* 26.2* 29.3*  MCV 83.8 83.4 84.4  PLT 111* 117* 124*   Cardiac Enzymes: No results for input(s): CKTOTAL, CKMB, CKMBINDEX, TROPONINI in the last 168 hours. BNP (last 3 results) No results for input(s): BNP in the last 8760 hours.  ProBNP (last 3 results) No results for input(s): PROBNP in the last 8760 hours.  CBG:  Recent Labs Lab 08/26/14 0736 08/27/14 0744 08/28/14 0743  GLUCAP 122* 133* 102*    Recent Results (from the past 240 hour(s))  Culture, blood (routine x 2)     Status: None (Preliminary result)   Collection Time: 08/26/14  7:05 AM  Result Value Ref Range Status   Specimen Description BLOOD RIGHT ARM  Final   Special Requests   Final    BOTTLES DRAWN AEROBIC AND ANAEROBIC 10CC BOTH BOTTLES   Culture   Final           BLOOD CULTURE RECEIVED NO GROWTH TO DATE CULTURE WILL BE HELD FOR 5 DAYS BEFORE ISSUING A FINAL NEGATIVE REPORT Performed at [facility]    Report Status PENDING  Incomplete  Culture, blood (routine x 2)     Status: None (Preliminary result)   Collection Time: 08/26/14  7:10 AM  Result Value Ref Range Status   Specimen Description BLOOD LEFT ARM  Final   Special Requests   Final    BOTTLES DRAWN AEROBIC AND ANAEROBIC 10CC BOTH BOTTLES   Culture   Final           BLOOD CULTURE RECEIVED NO GROWTH TO DATE CULTURE WILL BE HELD FOR 5 DAYS BEFORE ISSUING  A FINAL NEGATIVE REPORT Performed at [facility]    Report Status PENDING  Incomplete     Studies: Dg Abd 2 Views  08/28/2014   CLINICAL DATA:  Abdominal discomfort and distension. Small bowel obstruction.  EXAM: ABDOMEN - 2 VIEW  COMPARISON:  08/26/2014  FINDINGS: Stable position of the colonic stent in the mid pelvis. Slight improvement in the small bowel distension compared to 08/26/2014. NG tube in the proximal stomach. Minimal change in the residual colonic stool burden. No acute osseous finding.  IMPRESSION: Slight improvement in small bowel distension compatible with resolving obstruction.   Electronically Signed   By: Jerilynn Mages.  Shick M.D.   On: 08/28/2014 08:37    Scheduled Meds: . antiseptic oral rinse  7 mL Mouth Rinse BID  . imipenem-cilastatin  250 mg Intravenous Q6H  . metoCLOPramide (REGLAN) injection  5 mg Intravenous 3 times per day  . nicotine  21 mg Transdermal Daily  . pantoprazole (PROTONIX) IV  40 mg Intravenous QHS  . sodium chloride  10-40 mL Intracatheter Q12H  . sodium chloride  3 mL Intravenous Q12H   Continuous Infusions: . sodium chloride 100 mL/hr at 08/27/14 1828    Active Problems:   Fever   Liver lesion   Dehydration   Nausea and vomiting   SBO (small bowel obstruction)    Time spent: 25 minutes    [facility] Pager [phone]. If 7PM-7AM, please contact night-coverage at [email or web address], password [facility] 08/28/2014, 2:45 PM  LOS: 3 days

## 2014-08-28 NOTE — Progress Notes (Signed)
Pt c/o chest pain 10/10  Called MD and notified her of it. Hr 158 BP 147/82    . Rapid responds assessing pt

## 2014-08-28 NOTE — Progress Notes (Signed)
Pt states chest pain is has decreased to comfortable level. Pt refuses to take nitro at this time

## 2014-08-28 NOTE — Progress Notes (Signed)
Patient ID: Sydney Morrison, female   DOB: [birth year], 59 y.o.   MRN: [id number]    Subjective: Feels better today. States her abdominal pain has resolved. She has had several bowel movements.  Objective: Vital signs in last 24 hours: Temp:  [98.4 F (36.9 C)-99.4 F (37.4 C)] 98.4 F (36.9 C) (05/30 0358) Pulse Rate:  [80-97] 80 (05/30 0358) Resp:  [18-20] 20 (05/30 0358) BP: (124-131)/(70-72) 131/71 mmHg (05/30 0358) SpO2:  [97 %-99 %] 97 % (05/30 0358) Weight:  [44.3 kg (97 lb 10.6 oz)] 44.3 kg (97 lb 10.6 oz) (05/30 0358) Last BM Date: 08/27/14  Intake/Output from previous day: 05/29 0701 - 05/30 0700 In: 2496.3 [I.V.:2036.3; NG/GT:60; IV Piggyback:400] Out: 2000 [Emesis/NG output:2000] Intake/Output this shift:    General appearance: alert, cooperative, no distress and thin and chronically ill-appearing GI: nondistended, soft and nontender, palpable mass left lower quadrant nontender  Lab Results:   Recent Labs  08/25/14 1734 08/27/14 1020  WBC 5.4 2.3*  HGB 8.5* 9.2*  HCT 26.2* 29.3*  PLT 117* 124*   BMET  Recent Labs  08/25/14 1734 08/27/14 1020  NA 136 139  K 4.1 3.2*  CL 99* 103  CO2 27 22  GLUCOSE 104* 124*  BUN 29* 41*  CREATININE 0.61 0.75  CALCIUM 8.5* 8.8*     Studies/Results: KUB this morning without official reading but by my interpretation shows marked improvement with significantly reduced small bowel distention and gas  Anti-infectives: Anti-infectives    Start     Dose/Rate Route Frequency Ordered Stop   08/25/14 1800  imipenem-cilastatin (PRIMAXIN) 250 mg in sodium chloride 0.9 % 100 mL IVPB     250 mg 200 mL/hr over 30 Minutes Intravenous Every 6 hours 08/25/14 1716     08/25/14 1800  metroNIDAZOLE (FLAGYL) IVPB 500 mg  Status:  Discontinued     500 mg 100 mL/hr over 60 Minutes Intravenous Every 8 hours 08/25/14 1716 08/27/14 1003      Assessment/Plan: Metastatic rectal cancer presenting with abdominal pain and evidence of  small bowel obstruction. She is clinically much improved today and x-rays show resolving SBO. Will order NG out and start clear liquid diet.rectal     LOS: 3 days    Iysha Mishkin T 08/28/2014

## 2014-08-28 NOTE — Progress Notes (Signed)
ANTIBIOTIC CONSULT NOTE - FOLLOW UP  Pharmacy Consult for primaxin Indication: Intra-abdominal Infection  Allergies  Allergen Reactions  . Avelox [Moxifloxacin Hcl In Nacl] Hives  . Codeine Hives  . Dextromethorphan Hives  . Erythromycin Hives  . Penicillins Hives  . Suprep [Na Sulfate-K Sulfate-Mg Sulf] Nausea And Vomiting    Patient Measurements: Height: 5\' 7"  (170.2 cm) Weight: 97 lb 10.6 oz (44.3 kg) IBW/kg (Calculated) : 61.6  Vital Signs: Temp: 98.4 F (36.9 C) (05/30 0358) Temp Source: Oral (05/30 0358) BP: 133/73 mmHg (05/30 0816) Pulse Rate: 80 (05/30 0358) Intake/Output from previous day: 05/29 0701 - 05/30 0700 In: 2496.3 [I.V.:2036.3; NG/GT:60; IV Piggyback:400] Out: 2000 [Emesis/NG output:2000] Intake/Output from this shift:    Labs:  Recent Labs  08/25/14 1734 08/27/14 1020  WBC 5.4 2.3*  HGB 8.5* 9.2*  PLT 117* 124*  CREATININE 0.61 0.75   Estimated Creatinine Clearance: 54.3 mL/min (by C-G formula based on Cr of 0.75). No results for input(s): VANCOTROUGH, VANCOPEAK, VANCORANDOM, GENTTROUGH, GENTPEAK, GENTRANDOM, TOBRATROUGH, TOBRAPEAK, TOBRARND, AMIKACINPEAK, AMIKACINTROU, AMIKACIN in the last 72 hours.   Microbiology: Recent Results (from the past 720 hour(s))  Culture, blood (routine x 2)     Status: None (Preliminary result)   Collection Time: 08/26/14  7:05 AM  Result Value Ref Range Status   Specimen Description BLOOD RIGHT ARM  Final   Special Requests   Final    BOTTLES DRAWN AEROBIC AND ANAEROBIC 10CC BOTH BOTTLES   Culture   Final           BLOOD CULTURE RECEIVED NO GROWTH TO DATE CULTURE WILL BE HELD FOR 5 DAYS BEFORE ISSUING A FINAL NEGATIVE REPORT Performed at [facility]    Report Status PENDING  Incomplete  Culture, blood (routine x 2)     Status: None (Preliminary result)   Collection Time: 08/26/14  7:10 AM  Result Value Ref Range Status   Specimen Description BLOOD LEFT ARM  Final   Special Requests   Final   BOTTLES DRAWN AEROBIC AND ANAEROBIC 10CC BOTH BOTTLES   Culture   Final           BLOOD CULTURE RECEIVED NO GROWTH TO DATE CULTURE WILL BE HELD FOR 5 DAYS BEFORE ISSUING A FINAL NEGATIVE REPORT Performed at [facility]    Report Status PENDING  Incomplete    Anti-infectives    Start     Dose/Rate Route Frequency Ordered Stop   08/25/14 1800  imipenem-cilastatin (PRIMAXIN) 250 mg in sodium chloride 0.9 % 100 mL IVPB     250 mg 200 mL/hr over 30 Minutes Intravenous Every 6 hours 08/25/14 1716     08/25/14 1800  metroNIDAZOLE (FLAGYL) IVPB 500 mg  Status:  Discontinued     500 mg 100 mL/hr over 60 Minutes Intravenous Every 8 hours 08/25/14 1716 08/27/14 1003      Assessment: Patient's a 59 y.o F with rectal cancer currently undergoing chemotherapy treatment. She was seen and treated in the ED for nausea on 5/26. She presented back to [facility] on 5/27 for further workup and management of persistent n/v. Abx were started empirically for suspected intra-abd infection. Of note, patient is allergic to PCN but tolerated primaxin in the past.  5/27 primaxin>> 5/27 flagyl>>5/29  afeb, WBC 2.3, SCr 0.75 (CrCl~54)  5/28 bc x2: ngtd  Plan:  - continue primaxin 250mg  IV q6h - please indicate plan/LOT for abx  Parker Sawatzky P 08/28/2014,10:06 AM

## 2014-08-28 NOTE — Progress Notes (Signed)
Patient reported chest pain around 6 pm, ordered EKG, troponins and echocardiogram. Her telemetry shows sinus tachycardia at 150/min. Aspirin, nitro and metoprolol ordered. Currently her pain is 1/10. Called cardiology consultation. Plan to see the patient in am.  Continue to monitor.   Hosie Poisson, MD [phone]

## 2014-08-28 NOTE — Progress Notes (Addendum)
IP PROGRESS NOTE  Subjective:   She reports improvement in the nausea/vomiting and abdominal pain with placement of the NG tube. She is having loose bowel movements.  Objective: Vital signs in last 24 hours: Blood pressure 133/73, pulse 80, temperature 98.4 F (36.9 C), temperature source Oral, resp. rate 20, height 5' 7"  (1.702 m), weight 97 lb 10.6 oz (44.3 kg), SpO2 97 %.  Intake/Output from previous day: 05/29 0701 - 05/30 0700 In: 2496.3 [I.V.:2036.3; NG/GT:60; IV Piggyback:400] Out: 2000 [Emesis/NG output:2000]  Physical Exam:  HEENT: No thrush Lungs: Clear bilaterally Cardiac: Regular rate and rhythm Abdomen: Soft, firm tubular masslike fullness at the left lower abdomen. Extremities: No leg edema   Portacath/PICC-without erythema  Lab Results:  Recent Labs  08/25/14 1734 08/27/14 1020  WBC 5.4 2.3*  HGB 8.5* 9.2*  HCT 26.2* 29.3*  PLT 117* 124*    BMET  Recent Labs  08/25/14 1734 08/27/14 1020  NA 136 139  K 4.1 3.2*  CL 99* 103  CO2 27 22  GLUCOSE 104* 124*  BUN 29* 41*  CREATININE 0.61 0.75  CALCIUM 8.5* 8.8*    Studies/Results: CT 08/26/2014 reviewed Medications: I have reviewed the patient's current medications.  Assessment/Plan: 1. Rectal cancer  Obstructing mass at 10 cm from the anal verge noted on a colonoscopy 06/29/2014  CT chest, abdomen and pelvis 06/29/2014 revealed a partially obstructing rectosigmoid mass, widespread peritoneal metastatic disease in the pelvis, and diffuse liver metastases  Biopsy of a liver lesion 07/05/2014 confirmed metastatic adenocarcinoma, KRAS Q61H mutation  Cycle 1 FOLFOX 07/19/2014  Cycle 2 FOLFOX 08/02/2014  Cycle 3 FOLFOX 08/16/2014  Improvement in liver metastases noted on a CT 08/26/2014  2. Status post placement of a rectal stent on 06/30/2014, placed on bowel rest and IV antibiotics secondary to a possible microperforation  3. Crohn's disease  4. Anemia secondary to rectal bleeding  and multiple blood draws  5. Anorexia/weight loss  6. Fever-tumor fever?, Microperforation related to the rectal stent?-Improved  7. Status post Port-A-Cath placement 07/04/2014  8. Left pneumothorax following the Port-A-Cath placement, status post chest tube placement 07/05/2014. Chest tube removed 07/08/2014.  9. Acute onset pain right upper abdomen 07/23/2014 status post emergency room evaluation. Likely secondary to liver metastases. Improved.  10. Delayed nausea following cycle 1 FOLFOX. Aloxi and Emend added with cycle 2. Prophylactic Decadron added with cycle 3  11. Admission 08/25/2014 with abdominal pain and nausea/vomiting-CT 08/26/2014 confirmed a distal small bowel obstruction  12. Pancytopenia secondary to chemotherapy  Ms. Clasby is admitted with a bowel obstruction. The obstruction is most likely related to tumor. A rectal stent is in place, but she continues to have a large amount of stool in the colon. I reviewed the 08/26/2014 CT images. The liver metastases appear significantly smaller.  Recommendations: 1. Management of the bowel obstruction per the surgical service 2. Cycle 4 FOLFOX on hold pending resolution of the bowel obstruction 3. Consider starting TNA if she is not taking by mouth today 4. Discontinue scheduled Phenergan with NG tube in place    LOS: 3 days   [place]  08/28/2014, 8:38 AM

## 2014-08-29 ENCOUNTER — Inpatient Hospital Stay (HOSPITAL_COMMUNITY): Payer: 59

## 2014-08-29 DIAGNOSIS — R000 Tachycardia, unspecified: Secondary | ICD-10-CM

## 2014-08-29 DIAGNOSIS — I483 Typical atrial flutter: Secondary | ICD-10-CM

## 2014-08-29 DIAGNOSIS — E860 Dehydration: Secondary | ICD-10-CM

## 2014-08-29 DIAGNOSIS — R1110 Vomiting, unspecified: Secondary | ICD-10-CM

## 2014-08-29 LAB — CBC
HCT: 26.6 % — ABNORMAL LOW (ref 36.0–46.0)
Hemoglobin: 8.3 g/dL — ABNORMAL LOW (ref 12.0–15.0)
MCH: 26.7 pg (ref 26.0–34.0)
MCHC: 31.2 g/dL (ref 30.0–36.0)
MCV: 85.5 fL (ref 78.0–100.0)
Platelets: 130 10*3/uL — ABNORMAL LOW (ref 150–400)
RBC: 3.11 MIL/uL — ABNORMAL LOW (ref 3.87–5.11)
RDW: 18.4 % — ABNORMAL HIGH (ref 11.5–15.5)
WBC: 3.8 10*3/uL — ABNORMAL LOW (ref 4.0–10.5)

## 2014-08-29 LAB — GLUCOSE, CAPILLARY: Glucose-Capillary: 92 mg/dL (ref 65–99)

## 2014-08-29 LAB — BASIC METABOLIC PANEL
ANION GAP: 12 (ref 5–15)
BUN: 42 mg/dL — ABNORMAL HIGH (ref 6–20)
CALCIUM: 8.8 mg/dL — AB (ref 8.9–10.3)
CHLORIDE: 114 mmol/L — AB (ref 101–111)
CO2: 21 mmol/L — AB (ref 22–32)
CREATININE: 0.51 mg/dL (ref 0.44–1.00)
GFR calc Af Amer: >60 mL/min (ref >60)
Glucose, Bld: 98 mg/dL (ref 65–99)
Potassium: 3 mmol/L — ABNORMAL LOW (ref 3.5–5.1)
SODIUM: 147 mmol/L — AB (ref 135–145)

## 2014-08-29 LAB — TROPONIN I

## 2014-08-29 MED ORDER — METOCLOPRAMIDE HCL 10 MG PO TABS: 5.0000 mg | ORAL_TABLET | Freq: Four times a day (QID) | ORAL | Status: DC | PRN

## 2014-08-29 MED ORDER — POTASSIUM CHLORIDE 10 MEQ/100ML IV SOLN
10.0000 meq | INTRAVENOUS | Status: AC
  Administered 2014-08-29 (×3): 10 meq via INTRAVENOUS
  Filled 2014-08-29 (×3): qty 100

## 2014-08-29 MED ORDER — ONDANSETRON HCL 4 MG/2ML IJ SOLN
4.0000 mg | Freq: Four times a day (QID) | INTRAMUSCULAR | Status: DC | PRN
  Administered 2014-08-30 – 2014-09-12 (×6): 4 mg via INTRAVENOUS
  Filled 2014-08-29 (×9): qty 2

## 2014-08-29 MED ORDER — PROMETHAZINE HCL 25 MG/ML IJ SOLN
6.2500 mg | INTRAMUSCULAR | Status: DC | PRN
  Administered 2014-09-12 – 2014-09-15 (×2): 12.5 mg via INTRAVENOUS
  Filled 2014-08-29 (×3): qty 1

## 2014-08-29 MED ORDER — SODIUM CHLORIDE 0.9 % IV SOLN
INTRAVENOUS | Status: AC
  Administered 2014-08-30: 12:00:00 via INTRAVENOUS

## 2014-08-29 MED ORDER — SODIUM CHLORIDE 0.9 % IV SOLN
8.0000 mg | Freq: Four times a day (QID) | INTRAVENOUS | Status: DC | PRN
  Filled 2014-08-29: qty 4

## 2014-08-29 MED ORDER — ONDANSETRON 4 MG PO TBDP
4.0000 mg | ORAL_TABLET | Freq: Four times a day (QID) | ORAL | Status: DC | PRN
  Filled 2014-08-29: qty 2

## 2014-08-29 MED ORDER — FAT EMULSION 20 % IV EMUL
240.0000 mL | INTRAVENOUS | Status: DC
  Filled 2014-08-29: qty 250

## 2014-08-29 MED ORDER — TRACE MINERALS CR-CU-MN-SE-ZN 10-1000-500-60 MCG/ML IV SOLN
INTRAVENOUS | Status: DC
  Filled 2014-08-29: qty 720

## 2014-08-29 NOTE — Progress Notes (Signed)
[facility] Progress Note     Subjective: She c/o abdominal cramping, nausea, dry heaving.  She had vomiting this am.  She has since had 13+ BM's according to the patient.  She's frustrated about not being able to eat.  Xray shows improvement in stool burden and improved small bowel dilatation since yesterday.  Wants to know if she can be started on TPN.  Wants to know what her bumps are under her skin.    Objective: Vital signs in last 24 hours: Temp:  [98.1 F (36.7 C)-98.8 F (37.1 C)] 98.8 F (37.1 C) (05/31 1251) Pulse Rate:  [70-154] 70 (05/31 1251) Resp:  [18-20] 18 (05/31 1251) BP: (122-142)/(54-84) 122/54 mmHg (05/31 1251) SpO2:  [99 %-100 %] 100 % (05/31 1251) Weight:  [44.1 kg (97 lb 3.6 oz)] 44.1 kg (97 lb 3.6 oz) (05/31 0517) Last BM Date: 08/29/14  Intake/Output from previous day: 05/30 0701 - 05/31 0700 In: 1105.3 [P.O.:100; I.V.:1005.3] Out: 600 [Emesis/NG output:600] Intake/Output this shift:    PE: Gen:  Alert, NAD, pleasant Abd: Soft, distended, not very tender, +BS, subcutaneous nodules noted ?lipomas from injections?   Lab Results:   Recent Labs  08/27/14 1020 08/29/14 1000  WBC 2.3* 3.8*  HGB 9.2* 8.3*  HCT 29.3* 26.6*  PLT 124* 130*   BMET  Recent Labs  08/28/14 1735 08/29/14 1000  NA 144 147*  K 2.8* 3.0*  CL 110 114*  CO2 20* 21*  GLUCOSE 105* 98  BUN 47* 42*  CREATININE 0.61 0.51  CALCIUM 8.6* 8.8*   PT/INR No results for input(s): LABPROT, INR in the last 72 hours. CMP     Component Value Date/Time   NA 147* 08/29/2014 1000   NA 140 08/16/2014 1037   K 3.0* 08/29/2014 1000   K 4.2 08/16/2014 1037   CL 114* 08/29/2014 1000   CO2 21* 08/29/2014 1000   CO2 28 08/16/2014 1037   GLUCOSE 98 08/29/2014 1000   GLUCOSE 92 08/16/2014 1037   BUN 42* 08/29/2014 1000   BUN 23.1 08/16/2014 1037   CREATININE 0.51 08/29/2014 1000   CREATININE 0.7 08/16/2014 1037   CALCIUM 8.8* 08/29/2014 1000   CALCIUM 9.5 08/16/2014  1037   PROT 6.6 08/28/2014 1735   PROT 7.5 08/16/2014 1037   ALBUMIN 3.2* 08/28/2014 1735   ALBUMIN 3.8 08/16/2014 1037   AST 22 08/28/2014 1735   AST 21 08/16/2014 1037   ALT 19 08/28/2014 1735   ALT 15 08/16/2014 1037   ALKPHOS 75 08/28/2014 1735   ALKPHOS 143 08/16/2014 1037   BILITOT 0.9 08/28/2014 1735   BILITOT 0.28 08/16/2014 1037   GFRNONAA >60 08/29/2014 1000   GFRAA >60 08/29/2014 1000   Lipase     Component Value Date/Time   LIPASE 30 08/24/2014 0510       Studies/Results: Dg Chest Port 1 View  08/28/2014   CLINICAL DATA:  Chest pain, tachycardia, history of colon cancer  EXAM: PORTABLE CHEST - 1 VIEW  COMPARISON:  08/24/2014  FINDINGS: Lungs are clear.  No pleural effusion or pneumothorax.  The heart is normal in size.  Left chest port terminates at the cavoatrial junction.  IMPRESSION: No evidence of acute cardiopulmonary disease.   Electronically Signed   By: Julian Hy M.D.   On: 08/28/2014 19:54   Dg Abd 2 Views  08/29/2014   CLINICAL DATA:  Recent removal nasogastric tube for small bowel obstruction. Persistent nausea and vomiting.  EXAM: ABDOMEN - 2 VIEW  COMPARISON:  Abdominal radiographs 08/28/2014 and 08/26/2014. Acute abdominal series 08/24/2014.  FINDINGS: Prior Port-A-Cath projects over the left chest, with the distal tip projecting over the distal superior vena cava. The lung bases are clear. Heart size is normal.  There are scattered air-fluid levels within small bowel and colon on the upright view. No evidence of pneumoperitoneum. Air-fluid level is noted in the gastric fundus.  Gaseous distention of a central small bowel loop appears decreased compared to the radiographs of 08/28/2014. Small bowel now measures 2.5 cm (previously 3.8 cm in this region). Gas is seen within a bowel loop in the right lower quadrant, projecting over the right iliac bone and right sacrum; it is difficult to determine if this is dilated small bowel or colon. There is a  moderate amount of stool within the colon; overall, colonic stool burden has decreased since 08/26/2014. A distal colonic stent is again noted.  IMPRESSION: Further improvement in small bowel distention since radiographs dated 08/28/2014.  Colonic stent present.  Decreasing stool burden.   Electronically Signed   By: Curlene Dolphin M.D.   On: 08/29/2014 11:27   Dg Abd 2 Views  08/28/2014   CLINICAL DATA:  Abdominal discomfort and distension. Small bowel obstruction.  EXAM: ABDOMEN - 2 VIEW  COMPARISON:  08/26/2014  FINDINGS: Stable position of the colonic stent in the mid pelvis. Slight improvement in the small bowel distension compared to 08/26/2014. NG tube in the proximal stomach. Minimal change in the residual colonic stool burden. No acute osseous finding.  IMPRESSION: Slight improvement in small bowel distension compatible with resolving obstruction.   Electronically Signed   By: Jerilynn Mages.  Shick M.D.   On: 08/28/2014 08:37    Anti-infectives: Anti-infectives    Start     Dose/Rate Route Frequency Ordered Stop   08/25/14 1800  imipenem-cilastatin (PRIMAXIN) 250 mg in sodium chloride 0.9 % 100 mL IVPB     250 mg 200 mL/hr over 30 Minutes Intravenous Every 6 hours 08/25/14 1716     08/25/14 1800  metroNIDAZOLE (FLAGYL) IVPB 500 mg  Status:  Discontinued     500 mg 100 mL/hr over 60 Minutes Intravenous Every 8 hours 08/25/14 1716 08/27/14 1003       Assessment/Plan Metastatic rectal cancer with liver mets s/p colonoscopic stenting on 06/30/14 - Dr. Benson Norway Abdominal pain and evidence of small bowel obstruction.  -Oncology following -SBO may be improving, but patient states she still feels nauseous even with a few sips of clears -Primary service started on TPN since she hasn't had anything to eat/drink. -NG tube discontinued, on clears -Decreased stool burden on xray today after multiple BM's -Ambulate and IS -SCD's and not on pharm dvt proph -Consider suppositories to help reduce stool  burden    LOS: 4 days    Nat Christen 08/29/2014, 1:45 PM Pager: [phone]

## 2014-08-29 NOTE — Progress Notes (Signed)
Heart rate per telemetry of 79 irregular. Patient states  :" I feel better"  Informed patient at this time, this nurse wants her to ask for assist when using bsc.

## 2014-08-29 NOTE — Progress Notes (Signed)
When I went to patient room to start TNA, patient and husband stated that they were told that this was not to be started today.  They state that they had an intensive conversation with Dr. Johney Maine today and they were told that he did not want this started.  Therefore at this time TNA is being declined by patient.  They also did not know that the patient would have to have an IV started and labs drawn through needle stick due to the [facility] being used solely for TNA.  I spoke with Minette Headland, RN regarding above conversation.  Minette Headland states that she will notify Dr. Johney Maine if he is on call this evening otherwise will have this followed up on in the morning.  Relayed this to the patient and husband who are agreeable with this plan.  TNA to return to pharmacy. Jetaime Pinnix, Demetrius Revel

## 2014-08-29 NOTE — Progress Notes (Signed)
08/29/14 1800  Patient c/o nausea and has emesis of 555mL in green bag. Patient has order for Zofran, Phenergan, and Reglan. All three medicines were offered but patient refuses to try any of them for her nausea/vomitting. Patient requests Ativan instead. Ativan 1mg  was given per order.

## 2014-08-29 NOTE — Consult Note (Signed)
Patient ID: Sydney Morrison MRN: [id number], DOB/AGE: [birth year]   Admit date: 08/25/2014   Primary Physician: Delphina Cahill, MD Primary Cardiologist: New   Pt. Profile:  59 y/o female with rectal cancer + liver metastasis and no prior cardiac history, admitted 08/25/14 for SBO. Cardiology consulted for chest pain and atrial flutter with RVR.   Problem List  Past Medical History  Diagnosis Date  . Crohn's disease   . Chronic headache   . Cancer     Past Surgical History  Procedure Laterality Date  . Breast surgery    . Uterine fibroid surgery    . Colonoscopy w/ biopsies  06/29/2014    DR HUNG  . Flexible sigmoidoscopy N/A 06/30/2014    Procedure: FLEXIBLE SIGMOIDOSCOPY;  Surgeon: Carol Ada, MD;  Location: [facility] ENDOSCOPY;  Service: Endoscopy;  Laterality: N/A;  . Colonic stent placement N/A 06/30/2014    Procedure: COLONIC STENT PLACEMENT;  Surgeon: Carol Ada, MD;  Location: [facility] ENDOSCOPY;  Service: Endoscopy;  Laterality: N/A;  with fluro   . Portacath placement N/A 07/04/2014    Procedure: POWER PORT PLACEMENT;  Surgeon: Alphonsa Overall, MD;  Location: [facility];  Service: General;  Laterality: N/A;     Allergies  Allergies  Allergen Reactions  . Avelox [Moxifloxacin Hcl In Nacl] Hives  . Codeine Hives  . Dextromethorphan Hives  . Erythromycin Hives  . Penicillins Hives  . Suprep [Na Sulfate-K Sulfate-Mg Sulf] Nausea And Vomiting    HPI  The patient is a 59 y/o female suffering from rectal cancer with liver metastasis. She is currently undergoing chemotherapy and is s/p rectal stent placement 06/2014. She has no prior cardiac history and no known cardiac risk factors. No h/o HTN, HLD or DM.  She presented to [facility] on 08/25/14 with complaints of severe nausea and vomiting. CT scan demonstrated SBO with contrast in colon from prior CT, concerning for stent malfunction and possible partial blockage. NGT intially placed and patient started on empiric antibiotics. On 5/30, patient  showed signs of clinical improvement and x-ray showed resolving SBO. NGT was removed and patient started on clear liquid diet. Night of 5/30, patient reported chest pain. EKG showed atrial flutter with 2:1 conduction and rapid ventricular rate of 154 bpm. This was also in the setting of severe hypokalemia with K at 2.8. Magnesium WNL at 2.2. Cardiac enzymes were cycled x 3 with only mild bump in troponin (<0.03-->0.04--><0.03). 2 D Echo obtained. Results pending. Patient was started on IV K overnight. Repeat BMP pending. Telemetry now shows NSR with HR at 65 bpm. No further recurrence in atrial flutter since last PM. Patient is now chest pain free. She reports CP resolved after heart rate improved.      Home Medications  Prior to Admission medications   Medication Sig Start Date End Date Taking? Authorizing Provider  acetaminophen (TYLENOL) 325 MG tablet Take 325 mg by mouth every 6 (six) hours as needed for fever or headache (headahce).    Yes Historical Provider, MD  cromolyn (NASALCROM) 5.2 MG/ACT nasal spray Place 1 spray into both nostrils at bedtime.   Yes Historical Provider, MD  Cyanocobalamin (VITAMIN B-12 CR) 1500 MCG TBCR Take 1 tablet by mouth daily.   Yes Historical Provider, MD  dexamethasone (DECADRON) 4 MG tablet Take 1 tablet (4 mg total) by mouth 2 (two) times daily. For 2 days. Begin day of pump disconnect. 08/16/14  Yes Ladell Pier, MD  Fluorouracil (ADRUCIL IV) Inject into the vein every  14 (fourteen) days.   Yes Historical Provider, MD  leucovorin in dextrose 5 % 250 mL Inject into the vein every 14 (fourteen) days.   Yes Historical Provider, MD  lidocaine-prilocaine (EMLA) cream Apply 1 application topically as needed. Apply to portacath site 1-2 hours prior to use 07/14/14  Yes Owens Shark, NP  LORazepam (ATIVAN) 0.5 MG tablet Take 1 tablet (0.5 mg total) by mouth every 6 (six) hours as needed for anxiety. 08/21/14  Yes Ladell Pier, MD  nicotine (NICODERM CQ - DOSED IN  MG/24 HOURS) 14 mg/24hr patch Place 14 mg onto the skin daily.   Yes Historical Provider, MD  omeprazole (PRILOSEC) 40 MG capsule Take 40 mg by mouth daily as needed (nausea and vomiting).   Yes Historical Provider, MD  ondansetron (ZOFRAN ODT) 4 MG disintegrating tablet 4mg  ODT q4 hours prn nausea/vomit 08/24/14  Yes Orpah Greek, MD  oxaliplatin in dextrose 5 % 500 mL Inject into the vein every 14 (fourteen) days.   Yes Historical Provider, MD  zolpidem (AMBIEN) 5 MG tablet Take 1 tablet (5 mg total) by mouth at bedtime as needed for sleep. 07/14/14  Yes Owens Shark, NP  traMADol (ULTRAM) 50 MG tablet Take 1 tablet (50 mg total) by mouth every 6 (six) hours as needed. 07/23/14   Nat Christen, MD    Family History  Family History  Problem Relation Age of Onset  . Hypertension Mother   . Cancer Father     Social History  History   Social History  . Marital Status: Single    Spouse Name: N/A  . Number of Children: N/A  . Years of Education: N/A   Occupational History  . Not on file.   Social History Main Topics  . Smoking status: Former Smoker -- 0.50 packs/day for 40 years    Types: Cigarettes    Quit date: 06/25/2014  . Smokeless tobacco: Never Used  . Alcohol Use: No  . Drug Use: No  . Sexual Activity: Not on file   Other Topics Concern  . Not on file   Social History Narrative     Review of Systems General:  No chills, fever, night sweats or weight changes.  Cardiovascular:  No chest pain, dyspnea on exertion, edema, orthopnea, palpitations, paroxysmal nocturnal dyspnea. Dermatological: No rash, lesions/masses Respiratory: No cough, dyspnea Urologic: No hematuria, dysuria Abdominal:   No nausea, vomiting, diarrhea, bright red blood per rectum, melena, or hematemesis Neurologic:  No visual changes, wkns, changes in mental status. All other systems reviewed and are otherwise negative except as noted above.  Physical Exam  Blood pressure 142/64, pulse 72,  temperature 98.1 F (36.7 C), temperature source Oral, resp. rate 20, height 5\' 7"  (1.702 m), weight 97 lb 3.6 oz (44.1 kg), SpO2 100 %.  General: Pleasant, NAD Psych: Normal affect. Neuro: Alert and oriented X 3. Moves all extremities spontaneously. HEENT: Normal  Neck: Supple without bruits or JVD. Lungs:  Resp regular and unlabored, CTA. Heart: RRR no s3, s4, or murmurs. Abdomen: Soft, non-tender, non-distended, BS + x 4.  Extremities: No clubbing, cyanosis or edema. DP/PT/Radials 2+ and equal bilaterally.  Labs  Troponin (Point of Care Test) No results for input(s): TROPIPOC in the last 72 hours.  Recent Labs  08/28/14 1735 08/28/14 2255 08/29/14 0455  TROPONINI <0.03 0.04* <0.03   Lab Results  Component Value Date   WBC 2.3* 08/27/2014   HGB 9.2* 08/27/2014   HCT 29.3* 08/27/2014  MCV 84.4 08/27/2014   PLT 124* 08/27/2014    Recent Labs Lab 08/28/14 1735  NA 144  K 2.8*  CL 110  CO2 20*  BUN 47*  CREATININE 0.61  CALCIUM 8.6*  PROT 6.6  BILITOT 0.9  ALKPHOS 75  ALT 19  AST 22  GLUCOSE 105*   No results found for: CHOL, HDL, LDLCALC, TRIG No results found for: DDIMER   Radiology/Studies  Ct Abdomen Pelvis Wo Contrast  08/26/2014   CLINICAL DATA:  Nausea and vomiting. Left lower quadrant pain for 3 days.  EXAM: CT ABDOMEN AND PELVIS WITHOUT CONTRAST  TECHNIQUE: Multidetector CT imaging of the abdomen and pelvis was performed following the standard protocol without IV contrast.  COMPARISON:  07/23/2014  FINDINGS: BODY WALL: No contributory findings.  LOWER CHEST: No acute findings  ABDOMEN/PELVIS:  Liver: Multi focal hepatic metastatic disease with faint mineralization. The masses appear decreased from comparison study, especially the dominant left lobe mass which now measures 6 cm (previously 9 cm).  Biliary: No evidence of biliary obstruction or stone.  Pancreas: Unremarkable.  Spleen: Unremarkable.  Adrenals: Unremarkable.  Kidneys and ureters: No  hydronephrosis. Punctate left nephrolithiasis  Bladder: Unremarkable.  Bowel: There is a stably positioned stent in the sigmoid colon to upper rectum with unchanged surrounding bowel wall thickening and erosion of the poximal stent into the wall, likely reaching the serosa. Formed stool present throughout the colon. High-density material is seen within the noninflamed appendix. Major change is diffuse dilation of small bowel with fluid levels, with transition point likely in the pelvis where there is peritoneal metastatic disease. No evidence of bowel necrosis or perforation  Peritoneum: Diffuse peritoneal metastases coating in the pelvic peritoneal recesses. Small ascites.  Vascular: No acute abnormality.  OSSEOUS: No acute abnormalities.  IMPRESSION: 1. 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 2. Unchanged positioning of the distal colonic stent, including erosion into the colonic wall. Large volume of desiccated stool present above the stent. 3. Extensive hepatic metastatic disease. The largest metastasis in the left lobe has decreased from 07/23/2014.   Electronically Signed   By: Monte Fantasia M.D.   On: 08/26/2014 02:00   Dg Abd 1 View  08/26/2014   CLINICAL DATA:  Metastatic colon cancer. Followup bowel obstruction.  EXAM: ABDOMEN - 1 VIEW  COMPARISON:  08/25/2014.  FINDINGS: Position of the distal colonic stent is unchanged from 08/24/2014. Again noted are multiple dilated loops of small bowel consistent with bowel obstruction. The degree of bowel distention is not significantly improved from previous exam.  IMPRESSION: 1. No change in small bowel obstruction pattern.   Electronically Signed   By: Kerby Moors M.D.   On: 08/26/2014 09:22   Dg Chest Port 1 View  08/28/2014   CLINICAL DATA:  Chest pain, tachycardia, history of colon cancer  EXAM: PORTABLE CHEST - 1 VIEW  COMPARISON:  08/24/2014  FINDINGS: Lungs are clear.  No pleural effusion or  pneumothorax.  The heart is normal in size.  Left chest port terminates at the cavoatrial junction.  IMPRESSION: No evidence of acute cardiopulmonary disease.   Electronically Signed   By: Julian Hy M.D.   On: 08/28/2014 19:54   Dg Abd 2 Views  08/28/2014   CLINICAL DATA:  Abdominal discomfort and distension. Small bowel obstruction.  EXAM: ABDOMEN - 2 VIEW  COMPARISON:  08/26/2014  FINDINGS: Stable position of the colonic stent in the mid pelvis. Slight improvement in the small bowel distension compared  to 08/26/2014. NG tube in the proximal stomach. Minimal change in the residual colonic stool burden. No acute osseous finding.  IMPRESSION: Slight improvement in small bowel distension compatible with resolving obstruction.   Electronically Signed   By: Jerilynn Mages.  Shick M.D.   On: 08/28/2014 08:37   Dg Abd Acute W/chest  08/24/2014   CLINICAL DATA:  Chemotherapy for colon cancer. Dehydration. Diffuse pain.  EXAM: DG ABDOMEN ACUTE W/ 1V CHEST  COMPARISON:  Abdominal CT 07/23/2014  FINDINGS: Chronic retention of stool above a sigmoid colon metallic stent. No evidence of high-grade obstruction or change from previous. No concerning intra-abdominal mass effect or calcification. Lung bases are clear.  IMPRESSION: Chronic retention of stool above of the patient's sigmoid colon stent. The stool appears desiccated and is likely resistant to passing through the stent. No high-grade obstruction or progression since 07/23/2014.   Electronically Signed   By: Monte Fantasia M.D.   On: 08/24/2014 06:51    ECG  08/28/14: Atrial Flutter with RVR 154 bpm  5/33/16: Telemetry shows NSR HR 65 bpm   Echocardiogram  Results pending    ASSESSMENT AND PLAN  Active Problems:   Fever   Liver lesion   Dehydration   Nausea and vomiting   SBO (small bowel obstruction)   Bowel perforation   1. Chest Pain: resolved. Now CP free. Likely secondary to atrial flutter w/ RVR. Cardiac enzymes cycled x 3 with flat trend.  Only slight bump in troponin to 0.04 x 1. Agree with obtaining 2D echo to assess LV function, wall motion and assess for any structural heart disease. Would not recommend any further cardiac w/u at this time.   2. Atrial Flutter w/ RVR: occurred in the setting of multiple medical issues including severe hypokalemia with K at 2.8, resolving SBO and metastatic rectal cancer. Now maintaining NSR on telemetry. Keep K WNL. Continue to monitor on telemetry.   3. Hypokalemia: s/p IV supplementation. Mg WNL. Repeat BMP pending. IM will continue to manage.   4. SBO: resolving. NGT discontinued 5/30. Continue management per General Surgery.  5. Rectal CA w/ Liver Mets: management per oncology.    Signed, Lyda Jester, PA-C 08/29/2014, 8:43 AM Patient seen and examined and history reviewed. Agree with above findings and plan. 59 yo WF with rectal CA with liver mets admitted with SBO. Yesterday am developed aflutter with RVR lasting about 30 minutes. Last night she had recurrent Aflutter with RVR associated with chest pain and palpitations. Lasted about 7.5 hours. Now in NSR. No chest pain. Ecg normal on follow up. No prior history of arrhythmia. Rhythm exacerbated by severe hypokalemia. Echo is normal. For now will observe. If atrial flutter recurs will start metoprolol. Need to replete potassium IV as unable to take po at this time. [place] vasc score is 1 based on female sex. I would not recommend anticoagulation at this time.   Caidon Foti [place], [place] 08/29/2014 1:44 PM

## 2014-08-29 NOTE — Progress Notes (Signed)
[facility] PROGRESS NOTE  Sydney Morrison MGQ:[id number] DOB: Feb 17, 1956 DOA: 08/25/2014 PCP: Delphina Cahill, MD Interim summary:  Assessment/Plan: Nausea vomiting and abdominal discomfort from abdominal distention: possibly from SBO, . Ng tube placed and surgery consulted.  abd films show improving sbo. NG tube taken out and started on clear liquid diet.  But pateint reports persistent nausea and vomiting and diarrhea and very minimal po intake. Spoke to Dr Benson Norway who will see the patient if she doesn't improve.  Symptomatic management with IV fluids, IV antiemetics.   Blood cultures done and pending.    Fevers SIRS:  Empirically treating with primaxin and flagyl. CT does not show any perforation. D/c flagyl . Recommend continue primaxin for 24 hours more and stop it if blood cultures are negative.   Rectal cancer with liever mets: Follow up with Dr Learta Codding as recommended.   Anemia and thrombocytopenia: Appears to be stable.   Hypokalemia replete as needed.    Chest pain, tachycardia, ? : probably from electrolyte abnormality. Resolved after fluid bolus, and improvement in potassium . Her EKG shows sinus rhythm and echocardiogram shows good LVEF.   Code Status: full code Family Communication: none at bedside Disposition Plan: pending further work up.    Consultants:  surgery  Procedures:  NG tube placement.   Antibiotics:  primaxin   HPI/Subjective: Reports nausea, vomiting  Objective: Filed Vitals:   08/29/14 0746  BP:   Pulse:   Temp: 98.1 F (36.7 C)  Resp:     Intake/Output Summary (Last 24 hours) at 08/29/14 1021 Last data filed at 08/29/14 0700  Gross per 24 hour  Intake 1105.33 ml  Output    600 ml  Net 505.33 ml   Filed Weights   08/27/14 0616 08/28/14 0358 08/29/14 0517  Weight: 47.1 kg (103 lb 13.4 oz) 44.3 kg (97 lb 10.6 oz) 44.1 kg (97 lb 3.6 oz)    Exam:   General:  Alert in mild distress.   Cardiovascular: s1s2  Respiratory:  ctab, no wheezing heard  Abdomen: soft distended , mild discomfort, no Bowel sounds heard  Musculoskeletal: no pedal edema.  Data Reviewed: Basic Metabolic Panel:  Recent Labs Lab 08/24/14 0510 08/25/14 1734 08/27/14 1020 08/28/14 1020 08/28/14 1735  NA 136 136 139 145 144  K 4.0 4.1 3.2* 3.1* 2.8*  CL 100* 99* 103 108 110  CO2 26 27 22 22  20*  GLUCOSE 117* 104* 124* 103* 105*  BUN 26* 29* 41* 50* 47*  CREATININE 0.58 0.61 0.75 0.64 0.61  CALCIUM 9.4 8.5* 8.8* 8.9 8.6*  MG  --  2.1  --   --  2.2  PHOS  --  3.2  --   --   --    Liver Function Tests:  Recent Labs Lab 08/24/14 0510 08/25/14 1734 08/28/14 1735  AST 23 20 22   ALT 15 14 19   ALKPHOS 124 99 75  BILITOT 0.5 0.6 0.9  PROT 7.8 6.6 6.6  ALBUMIN 4.2 3.5 3.2*    Recent Labs Lab 08/24/14 0510  LIPASE 30   No results for input(s): AMMONIA in the last 168 hours. CBC:  Recent Labs Lab 08/24/14 0510 08/25/14 1734 08/27/14 1020  WBC 6.1 5.4 2.3*  NEUTROABS 3.3 3.6  --   HGB 9.7* 8.5* 9.2*  HCT 30.6* 26.2* 29.3*  MCV 83.8 83.4 84.4  PLT 111* 117* 124*   Cardiac Enzymes:  Recent Labs Lab 08/28/14 1735 08/28/14 2255 08/29/14 0455  TROPONINI <0.03 0.04* <0.03  BNP (last 3 results) No results for input(s): BNP in the last 8760 hours.  ProBNP (last 3 results) No results for input(s): PROBNP in the last 8760 hours.  CBG:  Recent Labs Lab 08/26/14 0736 08/27/14 0744 08/28/14 0743 08/29/14 0744  GLUCAP 122* 133* 102* 92    Recent Results (from the past 240 hour(s))  Culture, blood (routine x 2)     Status: None (Preliminary result)   Collection Time: 08/26/14  7:05 AM  Result Value Ref Range Status   Specimen Description BLOOD RIGHT ARM  Final   Special Requests   Final    BOTTLES DRAWN AEROBIC AND ANAEROBIC 10CC BOTH BOTTLES   Culture   Final           BLOOD CULTURE RECEIVED NO GROWTH TO DATE CULTURE WILL BE HELD FOR 5 DAYS BEFORE ISSUING A FINAL NEGATIVE REPORT Performed at  [facility]    Report Status PENDING  Incomplete  Culture, blood (routine x 2)     Status: None (Preliminary result)   Collection Time: 08/26/14  7:10 AM  Result Value Ref Range Status   Specimen Description BLOOD LEFT ARM  Final   Special Requests   Final    BOTTLES DRAWN AEROBIC AND ANAEROBIC 10CC BOTH BOTTLES   Culture   Final           BLOOD CULTURE RECEIVED NO GROWTH TO DATE CULTURE WILL BE HELD FOR 5 DAYS BEFORE ISSUING A FINAL NEGATIVE REPORT Performed at [facility]    Report Status PENDING  Incomplete     Studies: Dg Chest Port 1 View  08/28/2014   CLINICAL DATA:  Chest pain, tachycardia, history of colon cancer  EXAM: PORTABLE CHEST - 1 VIEW  COMPARISON:  08/24/2014  FINDINGS: Lungs are clear.  No pleural effusion or pneumothorax.  The heart is normal in size.  Left chest port terminates at the cavoatrial junction.  IMPRESSION: No evidence of acute cardiopulmonary disease.   Electronically Signed   By: Julian Hy M.D.   On: 08/28/2014 19:54   Dg Abd 2 Views  08/28/2014   CLINICAL DATA:  Abdominal discomfort and distension. Small bowel obstruction.  EXAM: ABDOMEN - 2 VIEW  COMPARISON:  08/26/2014  FINDINGS: Stable position of the colonic stent in the mid pelvis. Slight improvement in the small bowel distension compared to 08/26/2014. NG tube in the proximal stomach. Minimal change in the residual colonic stool burden. No acute osseous finding.  IMPRESSION: Slight improvement in small bowel distension compatible with resolving obstruction.   Electronically Signed   By: Jerilynn Mages.  Shick M.D.   On: 08/28/2014 08:37    Scheduled Meds: . antiseptic oral rinse  7 mL Mouth Rinse BID  . aspirin  81 mg Oral Daily  . imipenem-cilastatin  250 mg Intravenous Q6H  . nicotine  21 mg Transdermal Daily  . pantoprazole (PROTONIX) IV  40 mg Intravenous QHS  . sodium chloride  10-40 mL Intracatheter Q12H  . sodium chloride  3 mL Intravenous Q12H   Continuous Infusions: .  sodium chloride 100 mL/hr at 08/28/14 1819    Active Problems:   Fever   Liver lesion   Dehydration   Nausea and vomiting   SBO (small bowel obstruction)   Bowel perforation    Time spent: 25 minutes    [facility] Pager [phone]. If 7PM-7AM, please contact night-coverage at [email or web address], password [facility] 08/29/2014, 10:21 AM  LOS: 4 days

## 2014-08-29 NOTE — Progress Notes (Signed)
PARENTERAL NUTRITION CONSULT NOTE - INITIAL  Pharmacy Consult for TPN Indication: Persistent N/V, possible SBO  Allergies  Allergen Reactions  . Avelox [Moxifloxacin Hcl In Nacl] Hives  . Codeine Hives  . Dextromethorphan Hives  . Erythromycin Hives  . Penicillins Hives  . Suprep [Na Sulfate-K Sulfate-Mg Sulf] Nausea And Vomiting    Patient Measurements: Height: 5\' 7"  (170.2 cm) Weight: 97 lb 3.6 oz (44.1 kg) IBW/kg (Calculated) : 61.6 Adjusted Body Weight: 44.1 kg Usual Weight: 50.4 kg  Vital Signs: Temp: 98.8 F (37.1 C) (05/31 1251) Temp Source: Oral (05/31 1251) BP: 122/54 mmHg (05/31 1251) Pulse Rate: 70 (05/31 1251) Intake/Output from previous day: 05/30 0701 - 05/31 0700 In: 1105.3 [P.O.:100; I.V.:1005.3] Out: 600 [Emesis/NG output:600] Intake/Output from this shift:    Labs:  Recent Labs  08/27/14 1020 08/29/14 1000  WBC 2.3* 3.8*  HGB 9.2* 8.3*  HCT 29.3* 26.6*  PLT 124* 130*     Recent Labs  08/28/14 1020 08/28/14 1735 08/29/14 1000  NA 145 144 147*  K 3.1* 2.8* 3.0*  CL 108 110 114*  CO2 22 20* 21*  GLUCOSE 103* 105* 98  BUN 50* 47* 42*  CREATININE 0.64 0.61 0.51  CALCIUM 8.9 8.6* 8.8*  MG  --  2.2  --   PROT  --  6.6  --   ALBUMIN  --  3.2*  --   AST  --  22  --   ALT  --  19  --   ALKPHOS  --  75  --   BILITOT  --  0.9  --    Estimated Creatinine Clearance: 54 mL/min (by C-G formula based on Cr of 0.51).    Recent Labs  08/27/14 0744 08/28/14 0743 08/29/14 0744  GLUCAP 133* 102* 92    Medical History: Past Medical History  Diagnosis Date  . Crohn's disease   . Chronic headache   . Cancer     Medications:  Scheduled:  . antiseptic oral rinse  7 mL Mouth Rinse BID  . aspirin  81 mg Oral Daily  . imipenem-cilastatin  250 mg Intravenous Q6H  . nicotine  21 mg Transdermal Daily  . pantoprazole (PROTONIX) IV  40 mg Intravenous QHS  . potassium chloride  10 mEq Intravenous Q1 Hr x 3  . sodium chloride  10-40 mL  Intracatheter Q12H  . sodium chloride  3 mL Intravenous Q12H   Insulin Requirements: none ordered  Current Nutrition: clear liquids, minimal intake  IVF: NS at 100 ml/hr  Central access: CVC port L chest TPN start date: 5/31  ASSESSMENT                                                                                                          HPI: 25yoF with PMH metastatic rectal cancer and rectal stent 06/2014, s/p 3 cycles chemo presents 5/27 with N/V x 1 day.  Found to have SBO, likely secondary to pelvic tumor on CT.  NG tube placed and later removed once SBO improved on Abd XR.  Started  on CL diet, but persistent N/V prevent meaningful PO intake.  To start on TPN per pharmacy for nutritional supplementation until tolerating PO  Significant events:   Today:    Glucose - CBGs wnl, no Hx DM  Electrolytes - K low, Na borderline high; other lytes wnl, including CorrCa  Renal - SCr low; CrCl 54 ml/min (mostly d/t low weight)  LFTs - wnl  TGs - pending  Prealbumin - pending  NUTRITIONAL GOALS                                                                                             RD recs: pending  Initial calculations: Clinimix E 5/15 at a goal rate of 50 ml/hr + 20% fat emulsion at 10 ml/hr to provide: 60 g/day protein, 1332 Kcal/day.  PLAN                                                                                                                         At 1800 today:  Start Clinimix E 5/15 at 30 ml/hr.  20% fat emulsion at 10 ml/hr.  Plan to advance as tolerated to the goal rate.  TPN to contain standard multivitamins and trace elements.  Reduce IVF to 70 ml/hr.  Add/Change SSI .   TPN lab panels on Mondays & Thursdays.  F/u daily.  Reuel Boom, PharmD Pager: [phone] 08/29/2014, 2:02 PM

## 2014-08-29 NOTE — Progress Notes (Signed)
NUTRITION NOTE  Pt being followed by RD. Consult for new TPN for pt with persistent N/V and possible SBO.    Nutrition needs: 1600-1800 kcal, 70-80 grams protein.  Recommend: Clinimix E 5/20 @ 60 mL/hr with 20% lipids @ 10 mL/hr to provide 72 grams protein, 1747 kcal.   Plan per pharmacy: At 1800 today:  Start Clinimix E 5/15 at 30 ml/hr   20% fat emulsion at 10 ml/hr.  Plan to advance as tolerated to the goal rate.  TPN to contain standard multivitamins and trace elements.  Reduce IVF to 70 ml/hr.  Add/Change SSI .   TPN lab panels on Mondays & Thursdays.  Clinimix E 5/15 @ 30 mL/hr with 20% lipids @ 10 mL/hr will provide 36 grams protein, 991 kcal. Goal rate: Clinimix E 5/15 @ 50 mL/hr with 20% lipids @ 10 mL/hr which will provide 60 grams protein, 1332 kcal which will not meet needs.     Jarome Matin, RD, LDN Inpatient Clinical Dietitian Pager # [phone] After hours/weekend pager # [phone]

## 2014-08-29 NOTE — Progress Notes (Signed)
  Echocardiogram 2D Echocardiogram has been performed.  Jennette Dubin 08/29/2014, 9:23 AM

## 2014-08-30 ENCOUNTER — Inpatient Hospital Stay (HOSPITAL_COMMUNITY): Payer: 59

## 2014-08-30 ENCOUNTER — Other Ambulatory Visit: Payer: 59

## 2014-08-30 ENCOUNTER — Ambulatory Visit: Payer: 59 | Admitting: Nurse Practitioner

## 2014-08-30 ENCOUNTER — Encounter: Payer: Self-pay | Admitting: *Deleted

## 2014-08-30 ENCOUNTER — Ambulatory Visit: Payer: 59

## 2014-08-30 ENCOUNTER — Encounter (HOSPITAL_COMMUNITY): Payer: Self-pay | Admitting: Surgery

## 2014-08-30 DIAGNOSIS — I4892 Unspecified atrial flutter: Secondary | ICD-10-CM | POA: Diagnosis not present

## 2014-08-30 DIAGNOSIS — R112 Nausea with vomiting, unspecified: Secondary | ICD-10-CM

## 2014-08-30 LAB — COMPREHENSIVE METABOLIC PANEL
ALK PHOS: 84 U/L (ref 38–126)
ALT: 16 U/L (ref 14–54)
AST: 26 U/L (ref 15–41)
Albumin: 3.5 g/dL (ref 3.5–5.0)
Anion gap: 15 (ref 5–15)
BUN: 39 mg/dL — ABNORMAL HIGH (ref 6–20)
CALCIUM: 9.2 mg/dL (ref 8.9–10.3)
CO2: 22 mmol/L (ref 22–32)
Chloride: 110 mmol/L (ref 101–111)
Creatinine, Ser: 0.63 mg/dL (ref 0.44–1.00)
GFR calc Af Amer: >60 mL/min (ref >60)
Glucose, Bld: 98 mg/dL (ref 65–99)
POTASSIUM: 3.2 mmol/L — AB (ref 3.5–5.1)
SODIUM: 147 mmol/L — AB (ref 135–145)
TOTAL PROTEIN: 6.8 g/dL (ref 6.5–8.1)
Total Bilirubin: 1 mg/dL (ref 0.3–1.2)

## 2014-08-30 LAB — DIFFERENTIAL
BASOS ABS: 0 10*3/uL (ref 0.0–0.1)
Basophils Relative: 1 % (ref 0–1)
Eosinophils Absolute: 0 10*3/uL (ref 0.0–0.7)
Eosinophils Relative: 1 % (ref 0–5)
LYMPHS ABS: 1.7 10*3/uL (ref 0.7–4.0)
LYMPHS PCT: 35 % (ref 12–46)
Monocytes Absolute: 1.7 10*3/uL — ABNORMAL HIGH (ref 0.1–1.0)
Monocytes Relative: 36 % — ABNORMAL HIGH (ref 3–12)
NEUTROS ABS: 1.3 10*3/uL — AB (ref 1.7–7.7)
Neutrophils Relative %: 28 % — ABNORMAL LOW (ref 43–77)

## 2014-08-30 LAB — MAGNESIUM: Magnesium: 2.2 mg/dL (ref 1.7–2.4)

## 2014-08-30 LAB — CBC
HCT: 33 % — ABNORMAL LOW (ref 36.0–46.0)
Hemoglobin: 10.4 g/dL — ABNORMAL LOW (ref 12.0–15.0)
MCH: 26.3 pg (ref 26.0–34.0)
MCHC: 31.5 g/dL (ref 30.0–36.0)
MCV: 83.3 fL (ref 78.0–100.0)
PLATELETS: 146 10*3/uL — AB (ref 150–400)
RBC: 3.96 MIL/uL (ref 3.87–5.11)
RDW: 18.5 % — AB (ref 11.5–15.5)
WBC: 4.7 10*3/uL (ref 4.0–10.5)

## 2014-08-30 LAB — GLUCOSE, CAPILLARY
GLUCOSE-CAPILLARY: 148 mg/dL — AB (ref 65–99)
Glucose-Capillary: 100 mg/dL — ABNORMAL HIGH (ref 65–99)
Glucose-Capillary: 150 mg/dL — ABNORMAL HIGH (ref 65–99)

## 2014-08-30 LAB — PHOSPHORUS: Phosphorus: 2 mg/dL — ABNORMAL LOW (ref 2.5–4.6)

## 2014-08-30 LAB — TRIGLYCERIDES: Triglycerides: 226 mg/dL — ABNORMAL HIGH (ref <150)

## 2014-08-30 LAB — PREALBUMIN: Prealbumin: 13.2 mg/dL — ABNORMAL LOW (ref 18–38)

## 2014-08-30 MED ORDER — INSULIN ASPART 100 UNIT/ML ~~LOC~~ SOLN
0.0000 [IU] | SUBCUTANEOUS | Status: DC
  Administered 2014-08-30 (×2): 1 [IU] via SUBCUTANEOUS
  Administered 2014-08-31 (×2): 2 [IU] via SUBCUTANEOUS
  Administered 2014-08-31 (×2): 3 [IU] via SUBCUTANEOUS
  Administered 2014-09-01 (×2): 2 [IU] via SUBCUTANEOUS
  Administered 2014-09-01: 1 [IU] via SUBCUTANEOUS

## 2014-08-30 MED ORDER — ALVIMOPAN 12 MG PO CAPS
12.0000 mg | ORAL_CAPSULE | Freq: Once | ORAL | Status: DC
Start: 2014-08-30 — End: 2014-08-30
  Filled 2014-08-30: qty 1

## 2014-08-30 MED ORDER — CHLORHEXIDINE GLUCONATE 4 % EX LIQD
60.0000 mL | Freq: Once | CUTANEOUS | Status: AC
  Administered 2014-08-31: 4 via TOPICAL
  Filled 2014-08-30: qty 60

## 2014-08-30 MED ORDER — CHLORHEXIDINE GLUCONATE 4 % EX LIQD
60.0000 mL | Freq: Once | CUTANEOUS | Status: AC
  Administered 2014-08-30: 4 via TOPICAL
  Filled 2014-08-30: qty 60

## 2014-08-30 MED ORDER — FAT EMULSION 20 % IV EMUL
240.0000 mL | INTRAVENOUS | Status: AC
  Administered 2014-08-30: 240 mL via INTRAVENOUS
  Filled 2014-08-30: qty 250

## 2014-08-30 MED ORDER — SODIUM CHLORIDE 0.9 % IV SOLN: INTRAVENOUS | Status: DC

## 2014-08-30 MED ORDER — SODIUM CHLORIDE 0.9 % IV SOLN
250.0000 mg | Freq: Three times a day (TID) | INTRAVENOUS | Status: DC
  Administered 2014-08-30 – 2014-09-01 (×5): 250 mg via INTRAVENOUS
  Filled 2014-08-30 (×7): qty 250

## 2014-08-30 MED ORDER — POTASSIUM CHLORIDE 10 MEQ/100ML IV SOLN
10.0000 meq | INTRAVENOUS | Status: AC
  Administered 2014-08-30 (×2): 10 meq via INTRAVENOUS
  Filled 2014-08-30: qty 100

## 2014-08-30 MED ORDER — GENTAMICIN SULFATE 40 MG/ML IJ SOLN
5.0000 mg/kg | INTRAVENOUS | Status: AC
  Administered 2014-08-31: 220.5 mg via INTRAVENOUS
  Filled 2014-08-30: qty 5.5

## 2014-08-30 MED ORDER — ALVIMOPAN 12 MG PO CAPS
12.0000 mg | ORAL_CAPSULE | Freq: Once | ORAL | Status: AC
  Administered 2014-08-31: 12 mg via ORAL
  Filled 2014-08-30: qty 1

## 2014-08-30 MED ORDER — BUPIVACAINE 0.25 % ON-Q PUMP DUAL CATH 300 ML
300.0000 mL | INJECTION | Status: DC
  Filled 2014-08-30 (×2): qty 300

## 2014-08-30 MED ORDER — POTASSIUM PHOSPHATES 15 MMOLE/5ML IV SOLN
15.0000 mmol | Freq: Once | INTRAVENOUS | Status: AC
  Administered 2014-08-30: 15 mmol via INTRAVENOUS
  Filled 2014-08-30: qty 5

## 2014-08-30 MED ORDER — HEPARIN SODIUM (PORCINE) 5000 UNIT/ML IJ SOLN: 5000.0000 [IU] | Freq: Once | INTRAMUSCULAR | Status: DC

## 2014-08-30 MED ORDER — CLINDAMYCIN PHOSPHATE 900 MG/50ML IV SOLN
900.0000 mg | INTRAVENOUS | Status: AC
  Administered 2014-08-31: 900 mg via INTRAVENOUS
  Filled 2014-08-30: qty 50

## 2014-08-30 MED ORDER — SCOPOLAMINE 1 MG/3DAYS TD PT72
1.0000 | MEDICATED_PATCH | TRANSDERMAL | Status: DC
  Administered 2014-08-31 – 2014-09-09 (×4): 1.5 mg via TRANSDERMAL
  Filled 2014-08-30 (×5): qty 1

## 2014-08-30 MED ORDER — TRACE MINERALS CR-CU-MN-SE-ZN 10-1000-500-60 MCG/ML IV SOLN
INTRAVENOUS | Status: AC
  Administered 2014-08-30: 18:00:00 via INTRAVENOUS
  Filled 2014-08-30: qty 960

## 2014-08-30 NOTE — Progress Notes (Signed)
[facility] PROGRESS NOTE  Sydney Morrison QIO:[id number] DOB: 1955/07/25 DOA: 08/25/2014 PCP: Delphina Cahill, MD Interim summary: 59 year old female with h/o rectal cancer, follows up with Dr Learta Codding and Dr Benson Norway came in for SBO.   Assessment/Plan: Nausea vomiting and abdominal discomfort from abdominal distention: possibly from SBO, . Ng tube placed and surgery consulted.  abd films show improving sbo. NG tube taken out and started on clear liquid diet.  But pateint reports persistent nausea and vomiting and diarrhea and very minimal po intake. DR Benson Norway aware of patient's admission. Plan for OR in am by surgery for possible laparotomy.  Symptomatic management with IV fluids, IV antiemetics.   Blood cultures done and pending.    Fevers SIRS:  Empirically treating with primaxin and flagyl. CT does not show any perforation. D/c flagyl . Recommend continue primaxin for 24 hours more and stop it if blood cultures are negative.   Rectal cancer with liever mets: Follow up with Dr Learta Codding as recommended.   Anemia and thrombocytopenia: Appears to be stable.   Hypokalemia replete as needed.    Chest pain, tachycardia, ? : probably from electrolyte abnormality. Resolved after fluid bolus, and improvement in potassium . Her EKG shows sinus rhythm and echocardiogram shows good LVEF.   Code Status: full code Family Communication: none at bedside Disposition Plan: pending further work up.    Consultants:  surgery  Procedures:  NG tube placement.   Antibiotics:  primaxin   HPI/Subjective: Reports nausea, vomiting  Objective: Filed Vitals:   08/30/14 1503  BP: 136/83  Pulse: 74  Temp: 98.3 F (36.8 C)  Resp: 18    Intake/Output Summary (Last 24 hours) at 08/30/14 1511 Last data filed at 08/30/14 0600  Gross per 24 hour  Intake 1712.33 ml  Output   2100 ml  Net -387.67 ml   Filed Weights   08/27/14 0616 08/28/14 0358 08/29/14 0517  Weight: 47.1 kg (103 lb 13.4 oz)  44.3 kg (97 lb 10.6 oz) 44.1 kg (97 lb 3.6 oz)    Exam:   General:  Alert in mild distress.   Cardiovascular: s1s2  Respiratory: ctab, no wheezing heard  Abdomen: soft distended , mild discomfort, no Bowel sounds heard  Musculoskeletal: no pedal edema.  Data Reviewed: Basic Metabolic Panel:  Recent Labs Lab 08/25/14 1734 08/27/14 1020 08/28/14 1020 08/28/14 1735 08/29/14 1000 08/30/14 0600  NA 136 139 145 144 147* 147*  K 4.1 3.2* 3.1* 2.8* 3.0* 3.2*  CL 99* 103 108 110 114* 110  CO2 27 22 22  20* 21* 22  GLUCOSE 104* 124* 103* 105* 98 98  BUN 29* 41* 50* 47* 42* 39*  CREATININE 0.61 0.75 0.64 0.61 0.51 0.63  CALCIUM 8.5* 8.8* 8.9 8.6* 8.8* 9.2  MG 2.1  --   --  2.2  --  2.2  PHOS 3.2  --   --   --   --  2.0*   Liver Function Tests:  Recent Labs Lab 08/24/14 0510 08/25/14 1734 08/28/14 1735 08/30/14 0600  AST 23 20 22 26   ALT 15 14 19 16   ALKPHOS 124 99 75 84  BILITOT 0.5 0.6 0.9 1.0  PROT 7.8 6.6 6.6 6.8  ALBUMIN 4.2 3.5 3.2* 3.5    Recent Labs Lab 08/24/14 0510  LIPASE 30   No results for input(s): AMMONIA in the last 168 hours. CBC:  Recent Labs Lab 08/24/14 0510 08/25/14 1734 08/27/14 1020 08/29/14 1000 08/30/14 0600  WBC 6.1 5.4 2.3*  3.8* 4.7  NEUTROABS 3.3 3.6  --   --  1.3*  HGB 9.7* 8.5* 9.2* 8.3* 10.4*  HCT 30.6* 26.2* 29.3* 26.6* 33.0*  MCV 83.8 83.4 84.4 85.5 83.3  PLT 111* 117* 124* 130* 146*   Cardiac Enzymes:  Recent Labs Lab 08/28/14 1735 08/28/14 2255 08/29/14 0455  TROPONINI <0.03 0.04* <0.03   BNP (last 3 results) No results for input(s): BNP in the last 8760 hours.  ProBNP (last 3 results) No results for input(s): PROBNP in the last 8760 hours.  CBG:  Recent Labs Lab 08/26/14 0736 08/27/14 0744 08/28/14 0743 08/29/14 0744 08/30/14 0737  GLUCAP 122* 133* 102* 92 100*    Recent Results (from the past 240 hour(s))  Culture, blood (routine x 2)     Status: None (Preliminary result)   Collection  Time: 08/26/14  7:05 AM  Result Value Ref Range Status   Specimen Description BLOOD RIGHT ARM  Final   Special Requests   Final    BOTTLES DRAWN AEROBIC AND ANAEROBIC 10CC BOTH BOTTLES   Culture   Final           BLOOD CULTURE RECEIVED NO GROWTH TO DATE CULTURE WILL BE HELD FOR 5 DAYS BEFORE ISSUING A FINAL NEGATIVE REPORT Performed at [facility]    Report Status PENDING  Incomplete  Culture, blood (routine x 2)     Status: None (Preliminary result)   Collection Time: 08/26/14  7:10 AM  Result Value Ref Range Status   Specimen Description BLOOD LEFT ARM  Final   Special Requests   Final    BOTTLES DRAWN AEROBIC AND ANAEROBIC 10CC BOTH BOTTLES   Culture   Final           BLOOD CULTURE RECEIVED NO GROWTH TO DATE CULTURE WILL BE HELD FOR 5 DAYS BEFORE ISSUING A FINAL NEGATIVE REPORT Performed at [facility]    Report Status PENDING  Incomplete     Studies: Dg Chest Port 1 View  08/28/2014   CLINICAL DATA:  Chest pain, tachycardia, history of colon cancer  EXAM: PORTABLE CHEST - 1 VIEW  COMPARISON:  08/24/2014  FINDINGS: Lungs are clear.  No pleural effusion or pneumothorax.  The heart is normal in size.  Left chest port terminates at the cavoatrial junction.  IMPRESSION: No evidence of acute cardiopulmonary disease.   Electronically Signed   By: Julian Hy M.D.   On: 08/28/2014 19:54   Dg Abd 2 Views  08/29/2014   CLINICAL DATA:  Recent removal nasogastric tube for small bowel obstruction. Persistent nausea and vomiting.  EXAM: ABDOMEN - 2 VIEW  COMPARISON:  Abdominal radiographs 08/28/2014 and 08/26/2014. Acute abdominal series 08/24/2014.  FINDINGS: Prior Port-A-Cath projects over the left chest, with the distal tip projecting over the distal superior vena cava. The lung bases are clear. Heart size is normal.  There are scattered air-fluid levels within small bowel and colon on the upright view. No evidence of pneumoperitoneum. Air-fluid level is noted in the  gastric fundus.  Gaseous distention of a central small bowel loop appears decreased compared to the radiographs of 08/28/2014. Small bowel now measures 2.5 cm (previously 3.8 cm in this region). Gas is seen within a bowel loop in the right lower quadrant, projecting over the right iliac bone and right sacrum; it is difficult to determine if this is dilated small bowel or colon. There is a moderate amount of stool within the colon; overall, colonic stool burden has decreased since 08/26/2014.  A distal colonic stent is again noted.  IMPRESSION: Further improvement in small bowel distention since radiographs dated 08/28/2014.  Colonic stent present.  Decreasing stool burden.   Electronically Signed   By: Curlene Dolphin M.D.   On: 08/29/2014 11:27   Dg Abd Acute W/chest  08/30/2014   CLINICAL DATA:  Follow-up small bowel obstruction. Intermittent abdominal pain with nausea and vomiting.  EXAM: DG ABDOMEN ACUTE W/ 1V CHEST  COMPARISON:  08/29/2014  FINDINGS: Large stool burden throughout the colon. Continued mildly prominent mid abdominal small bowel loops with scattered air-fluid levels. No free air organomegaly. Presumed colonic stent in the pelvis.  Left Port-A-Cath remains in place, unchanged. Heart is normal size. Mild hyperinflation of the lungs without focal opacity or effusion.  IMPRESSION: Continued mild prominence of mid abdominal small bowel loops with scattered air-fluid levels. Large stool burden throughout the colon. No real change since prior study.   Electronically Signed   By: Rolm Baptise M.D.   On: 08/30/2014 10:34    Scheduled Meds: . [START ON 08/31/2014] alvimopan  12 mg Oral Once  . antiseptic oral rinse  7 mL Mouth Rinse BID  . aspirin  81 mg Oral Daily  . chlorhexidine  60 mL Topical Once   And  . [START ON 08/31/2014] chlorhexidine  60 mL Topical Once  . [START ON 08/31/2014] clindamycin (CLEOCIN) IV  900 mg Intravenous 60 min Pre-Op   And  . [START ON 08/31/2014] gentamicin  5 mg/kg  Intravenous 60 min Pre-Op  . [START ON 08/31/2014] heparin  5,000 Units Subcutaneous Once  . imipenem-cilastatin  250 mg Intravenous Q6H  . nicotine  21 mg Transdermal Daily  . pantoprazole (PROTONIX) IV  40 mg Intravenous QHS  . potassium chloride  10 mEq Intravenous Q1 Hr x 2  . [START ON 08/31/2014] scopolamine  1 patch Transdermal Q72H  . sodium chloride  10-40 mL Intracatheter Q12H  . sodium chloride  3 mL Intravenous Q12H   Continuous Infusions: . sodium chloride 70 mL/hr at 08/30/14 1226  . [START ON 08/31/2014] bupivacaine 0.25 % ON-Q pump DUAL CATH 300 mL    . Marland KitchenTPN (CLINIMIX-E) Adult     And  . fat emulsion      Active Problems:   Fever   Liver lesion   Dehydration   Nausea and vomiting   SBO (small bowel obstruction)   Bowel perforation   Atrial flutter    Time spent: 25 minutes    [facility] Pager [phone]. If 7PM-7AM, please contact night-coverage at [email or web address], password [facility] 08/30/2014, 3:11 PM  LOS: 5 days

## 2014-08-30 NOTE — Progress Notes (Signed)
PARENTERAL NUTRITION CONSULT NOTE - Follow-up  Pharmacy Consult for TPN Indication: Persistent N/V, possible SBO  Allergies  Allergen Reactions  . Avelox [Moxifloxacin Hcl In Nacl] Hives  . Codeine Hives  . Dextromethorphan Hives  . Erythromycin Hives  . Penicillins Hives  . Suprep [Na Sulfate-K Sulfate-Mg Sulf] Nausea And Vomiting    Patient Measurements: Height: 5\' 7"  (170.2 cm) Weight: 97 lb 3.6 oz (44.1 kg) IBW/kg (Calculated) : 61.6 Adjusted Body Weight: 44.1 kg Usual Weight: 50.4 kg  Vital Signs: Temp: 98.3 F (36.8 C) (06/01 1503) Temp Source: Oral (06/01 1503) BP: 136/83 mmHg (06/01 1503) Pulse Rate: 74 (06/01 1503) Intake/Output from previous day: 05/31 0701 - 06/01 0700 In: 1812.3 [P.O.:60; I.V.:1252.3; IV Piggyback:500] Out: 2100 [Emesis/NG output:2100] Intake/Output from this shift:    Labs:  Recent Labs  08/29/14 1000 08/30/14 0600  WBC 3.8* 4.7  HGB 8.3* 10.4*  HCT 26.6* 33.0*  PLT 130* 146*     Recent Labs  08/28/14 1735 08/29/14 1000 08/30/14 0600  NA 144 147* 147*  K 2.8* 3.0* 3.2*  CL 110 114* 110  CO2 20* 21* 22  GLUCOSE 105* 98 98  BUN 47* 42* 39*  CREATININE 0.61 0.51 0.63  CALCIUM 8.6* 8.8* 9.2  MG 2.2  --  2.2  PHOS  --   --  2.0*  PROT 6.6  --  6.8  ALBUMIN 3.2*  --  3.5  AST 22  --  26  ALT 19  --  16  ALKPHOS 75  --  84  BILITOT 0.9  --  1.0  PREALBUMIN  --   --  13.2*  TRIG  --   --  226*   Estimated Creatinine Clearance: 54 mL/min (by C-G formula based on Cr of 0.63).    Recent Labs  08/28/14 0743 08/29/14 0744 08/30/14 0737  GLUCAP 102* 92 100*    Medical History: Past Medical History  Diagnosis Date  . Crohn's disease   . Chronic headache   . Cancer   . Nausea and vomiting 08/25/2014    Medications:  Scheduled:  . [START ON 08/31/2014] alvimopan  12 mg Oral Once  . antiseptic oral rinse  7 mL Mouth Rinse BID  . aspirin  81 mg Oral Daily  . chlorhexidine  60 mL Topical Once   And  . [START ON  08/31/2014] chlorhexidine  60 mL Topical Once  . [START ON 08/31/2014] clindamycin (CLEOCIN) IV  900 mg Intravenous 60 min Pre-Op   And  . [START ON 08/31/2014] gentamicin  5 mg/kg Intravenous 60 min Pre-Op  . [START ON 08/31/2014] heparin  5,000 Units Subcutaneous Once  . imipenem-cilastatin  250 mg Intravenous Q6H  . nicotine  21 mg Transdermal Daily  . pantoprazole (PROTONIX) IV  40 mg Intravenous QHS  . [START ON 08/31/2014] scopolamine  1 patch Transdermal Q72H  . sodium chloride  10-40 mL Intracatheter Q12H  . sodium chloride  3 mL Intravenous Q12H   Insulin Requirements: none ordered  Current Nutrition: clear liquids, minimal intake  IVF: NS at 100 ml/hr  Central access: CVC port L chest TPN start date: 6/1  ASSESSMENT  HPI: 53yoF with PMH metastatic rectal cancer and rectal stent 06/2014, s/p 3 cycles chemo presents 5/27 with N/V x 1 day.  Found to have SBO, likely secondary to pelvic tumor on CT.  NG tube placed and later removed once SBO improved on Abd XR.  Started on CL diet, but persistent N/V prevent meaningful PO intake.  To start on TPN per pharmacy for nutritional supplementation until tolerating PO  Significant events:  5/31: patient misunderstood Surgeon to imply that TPN was not going to start today and did not allow TPN to be initiated  Today:    Glucose - CBGs wnl, no Hx DM  Electrolytes - K, phos low (received 20 meq K today), Na borderline high.  Corr Ca wnl  Renal - SCr low; CrCl 54 ml/min (mostly d/t low weight)  LFTs - wnl  TGs - elevated at baseline with 226  Prealbumin - low at 13.2 (6/1)  NUTRITIONAL GOALS                                                                                             RD recs: Nutrition needs: 1600-1800 kcal, 70-80 grams protein. Recommend: Clinimix E 5/20 @ 60 mL/hr with 20% lipids @ 10 mL/hr to provide 72 grams protein,  1747 kcal  PLAN                                                                                                                         At 1800 today:  Start Clinimix E 5/20 at 40 ml/hr.  20% fat emulsion at 10 ml/hr.  Plan to advance as tolerated to the goal rate.  TPN to contain standard multivitamins and trace elements.  Reduce IVF to 60 ml/hr.  15 mmol K-Phos tonight  Sensitive SSI q4h for now as NPO for surgery tomorrow  TPN lab panels on Mondays & Thursdays.  F/u triglycerides  F/u daily.  Reuel Boom, PharmD Pager: [phone] 08/30/2014, 5:04 PM

## 2014-08-30 NOTE — Progress Notes (Signed)
[place] Social [facility]  Clinical Social Work was brought a copy of pt's healthcare advance directives.  CSW delivered this to medical records to be scanned into patient's chart. The patient designated Diona Peregoy as their primary healthcare agent and did not name a secondary agent.  Patient also completed healthcare living will.    Clinical Social Worker will send documents to medical records to be scanned into patient's chart.  Loren Racer, [place] Worker [place]  [place] Phone: [phone] Fax: [phone]

## 2014-08-30 NOTE — Progress Notes (Signed)
[address] [place], [place] [address] Phone: [phone] FAX: [place] [phone] 10-11-1955   Problem List:   Active Problems:   Fever   Liver lesion   Dehydration   Nausea and vomiting   SBO (small bowel obstruction)   Bowel perforation        Assessment  SBO with recurrent emesis & Xray w/o improvement in setting of metastatic rectal cancer  Plan: I think that she has failed nonop management & needs operative exploration.  High likelihood of needing SB resection vs bypass if associated with carcinomatosis.  Low threshold to perform diverting colostomy (palliative LAR resection if mobile & not w dense pelvic adhesions) vs end colostomy since rectal stent may be migrating & still w constipation concerning for partial colon obstruction as well.  Pt wants to be aggressive.  With met. disease improved/stable, will try to palliate aggressively / reasonably as possible  -The anatomy & physiology of the digestive tract was discussed.  The pathophysiology of perforation was discussed.  Differential diagnosis such as perforated ulcer or colon, etc was discussed.   Natural history risks without surgery such as death was discussed.  I recommended abdominal exploration to diagnose & treat the source of the problem.  Laparoscopic & open techniques were discussed.   Risks such as bleeding, infection, abscess, leak, reoperation, bowel resection, possible ostomy, hernia, heart attack, death, and other risks were discussed.   The risks of no intervention will lead to serious problems including death.   I expressed a good likelihood that surgery will address the problem.    Goals of post-operative recovery were discussed as well.  We will work to minimize complications although risks in an emergent setting are high.   Questions were answered.  The patient & her husband expressed understanding & wishes to proceed with  surgery.      -pt ate = tomorrow AM -TNA OK given persistent SBO (I never said otherwise).  Not great indefinite plan but reasonable over short term until obstruction treated/resolved -pt tends to distate care but willing to take (most of) surgical advice.   -palliate nausea per pt/oncology/primary team.  Consider scopolamine - pt willing to try periop.  -VTE prophylaxis- SCDs, etc -mobilize as tolerated to help recovery  Adin Hector, M.D., F.A.C.S. Gastrointestinal and Minimally Invasive Surgery [facility] [address] [place], [place] [address] [phone] Main / Paging   08/30/2014  Subjective:  Nauseated - Ativan works best for her - refused other meds Vomiting repeatedly Ate some eggs for late breakfast & drank ginger ale    Objective:  Vital signs:  Filed Vitals:   08/29/14 0746 08/29/14 1251 08/29/14 2302 08/30/14 0628  BP:  122/54 129/67 124/63  Pulse:  70 70 76  Temp: 98.1 F (36.7 C) 98.8 F (37.1 C) 98 F (36.7 C) 97.6 F (36.4 C)  TempSrc:  Oral Oral Oral  Resp:  18 18 18   Height:      Weight:      SpO2:  100% 100% 99%    Last BM Date: 08/29/14  Intake/Output   Yesterday:  05/31 0701 - 06/01 0700 In: 1812.3 [P.O.:60; I.V.:1252.3; IV Piggyback:500] Out: 2100 [Emesis/NG output:2100] This shift:     Bowel function:  Flatus: y  BM: scnat  Drain: n/a  Physical Exam:  General: Pt awake/alert/oriented x4 in no acute distress.  Cachectic Eyes: PERRL, normal EOM.  Sclera clear.  No icterus Neuro: CN II-XII intact w/o focal sensory/motor deficits. Lymph: No head/neck/groin lymphadenopathy Psych:  No delerium/psychosis/paranoia.  Mildly groggy HENT: Normocephalic, Mucus membranes moist.  No thrush Neck: Supple, No tracheal deviation Chest: No chest wall pain w good excursion CV:  Pulses intact.  Regular rhythm MS: Normal AROM mjr joints.  No obvious deformity Abdomen: Soft.  Moderately distended.   Firm RLQ.  No evidence of peritonitis.  No incarcerated hernias. Ext:  SCDs BLE.  No mjr edema.  No cyanosis Skin: No petechiae / purpura  Results:   Labs: Results for orders placed or performed during the hospital encounter of 08/25/14 (from the past 48 hour(s))  Troponin I (q 6hr x 3)     Status: None   Collection Time: 08/28/14  5:35 PM  Result Value Ref Range   Troponin I <0.03 <0.031 ng/mL    Comment:        NO INDICATION OF MYOCARDIAL INJURY.   Comprehensive metabolic panel     Status: Abnormal   Collection Time: 08/28/14  5:35 PM  Result Value Ref Range   Sodium 144 135 - 145 mmol/L   Potassium 2.8 (L) 3.5 - 5.1 mmol/L   Chloride 110 101 - 111 mmol/L   CO2 20 (L) 22 - 32 mmol/L   Glucose, Bld 105 (H) 65 - 99 mg/dL   BUN 47 (H) 6 - 20 mg/dL   Creatinine, Ser 0.61 0.44 - 1.00 mg/dL   Calcium 8.6 (L) 8.9 - 10.3 mg/dL   Total Protein 6.6 6.5 - 8.1 g/dL   Albumin 3.2 (L) 3.5 - 5.0 g/dL   AST 22 15 - 41 U/L   ALT 19 14 - 54 U/L   Alkaline Phosphatase 75 38 - 126 U/L   Total Bilirubin 0.9 0.3 - 1.2 mg/dL   GFR calc non Af Amer >60 >60 mL/min   GFR calc Af Amer >60 >60 mL/min    Comment: (NOTE) The eGFR has been calculated using the CKD EPI equation. This calculation has not been validated in all clinical situations. eGFR's persistently <60 mL/min signify possible Chronic Kidney Disease.    Anion gap 14 5 - 15  Magnesium     Status: None   Collection Time: 08/28/14  5:35 PM  Result Value Ref Range   Magnesium 2.2 1.7 - 2.4 mg/dL  Troponin I (q 6hr x 3)     Status: Abnormal   Collection Time: 08/28/14 10:55 PM  Result Value Ref Range   Troponin I 0.04 (H) <0.031 ng/mL    Comment:        PERSISTENTLY INCREASED TROPONIN VALUES IN THE RANGE OF 0.04-0.49 ng/mL CAN BE SEEN IN:       -UNSTABLE ANGINA       -CONGESTIVE HEART FAILURE       -MYOCARDITIS       -CHEST TRAUMA       -ARRYHTHMIAS       -LATE PRESENTING MYOCARDIAL INFARCTION       -COPD   CLINICAL  FOLLOW-UP RECOMMENDED.   Troponin I (q 6hr x 3)     Status: None   Collection Time: 08/29/14  4:55 AM  Result Value Ref Range   Troponin I <0.03 <0.031 ng/mL    Comment:        NO INDICATION OF MYOCARDIAL INJURY.   Glucose, capillary     Status: None   Collection Time: 08/29/14  7:44 AM  Result Value Ref Range   Glucose-Capillary  92 65 - 99 mg/dL  CBC     Status: Abnormal   Collection Time: 08/29/14 10:00 AM  Result Value Ref Range   WBC 3.8 (L) 4.0 - 10.5 K/uL   RBC 3.11 (L) 3.87 - 5.11 MIL/uL   Hemoglobin 8.3 (L) 12.0 - 15.0 g/dL   HCT 26.6 (L) 36.0 - 46.0 %   MCV 85.5 78.0 - 100.0 fL   MCH 26.7 26.0 - 34.0 pg   MCHC 31.2 30.0 - 36.0 g/dL   RDW 18.4 (H) 11.5 - 15.5 %   Platelets 130 (L) 150 - 400 K/uL  Basic metabolic panel     Status: Abnormal   Collection Time: 08/29/14 10:00 AM  Result Value Ref Range   Sodium 147 (H) 135 - 145 mmol/L   Potassium 3.0 (L) 3.5 - 5.1 mmol/L   Chloride 114 (H) 101 - 111 mmol/L   CO2 21 (L) 22 - 32 mmol/L   Glucose, Bld 98 65 - 99 mg/dL   BUN 42 (H) 6 - 20 mg/dL   Creatinine, Ser 0.51 0.44 - 1.00 mg/dL   Calcium 8.8 (L) 8.9 - 10.3 mg/dL   GFR calc non Af Amer >60 >60 mL/min   GFR calc Af Amer >60 >60 mL/min    Comment: (NOTE) The eGFR has been calculated using the CKD EPI equation. This calculation has not been validated in all clinical situations. eGFR's persistently <60 mL/min signify possible Chronic Kidney Disease.    Anion gap 12 5 - 15  Comprehensive metabolic panel     Status: Abnormal   Collection Time: 08/30/14  6:00 AM  Result Value Ref Range   Sodium 147 (H) 135 - 145 mmol/L   Potassium 3.2 (L) 3.5 - 5.1 mmol/L   Chloride 110 101 - 111 mmol/L   CO2 22 22 - 32 mmol/L   Glucose, Bld 98 65 - 99 mg/dL   BUN 39 (H) 6 - 20 mg/dL   Creatinine, Ser 0.63 0.44 - 1.00 mg/dL   Calcium 9.2 8.9 - 10.3 mg/dL   Total Protein 6.8 6.5 - 8.1 g/dL   Albumin 3.5 3.5 - 5.0 g/dL   AST 26 15 - 41 U/L   ALT 16 14 - 54 U/L   Alkaline  Phosphatase 84 38 - 126 U/L   Total Bilirubin 1.0 0.3 - 1.2 mg/dL   GFR calc non Af Amer >60 >60 mL/min   GFR calc Af Amer >60 >60 mL/min    Comment: (NOTE) The eGFR has been calculated using the CKD EPI equation. This calculation has not been validated in all clinical situations. eGFR's persistently <60 mL/min signify possible Chronic Kidney Disease.    Anion gap 15 5 - 15  Prealbumin     Status: Abnormal   Collection Time: 08/30/14  6:00 AM  Result Value Ref Range   Prealbumin 13.2 (L) 18 - 38 mg/dL    Comment: Performed at [facility]  Magnesium     Status: None   Collection Time: 08/30/14  6:00 AM  Result Value Ref Range   Magnesium 2.2 1.7 - 2.4 mg/dL  Phosphorus     Status: Abnormal   Collection Time: 08/30/14  6:00 AM  Result Value Ref Range   Phosphorus 2.0 (L) 2.5 - 4.6 mg/dL  Triglycerides     Status: Abnormal   Collection Time: 08/30/14  6:00 AM  Result Value Ref Range   Triglycerides 226 (H) <150 mg/dL    Comment: Performed at [facility]  CBC     Status: Abnormal   Collection Time: 08/30/14  6:00 AM  Result Value Ref Range   WBC 4.7 4.0 - 10.5 K/uL   RBC 3.96 3.87 - 5.11 MIL/uL   Hemoglobin 10.4 (L) 12.0 - 15.0 g/dL    Comment: REPEATED TO VERIFY DELTA CHECK NOTED    HCT 33.0 (L) 36.0 - 46.0 %   MCV 83.3 78.0 - 100.0 fL   MCH 26.3 26.0 - 34.0 pg   MCHC 31.5 30.0 - 36.0 g/dL   RDW 18.5 (H) 11.5 - 15.5 %   Platelets 146 (L) 150 - 400 K/uL  Differential     Status: Abnormal   Collection Time: 08/30/14  6:00 AM  Result Value Ref Range   Neutrophils Relative % 28 (L) 43 - 77 %   Neutro Abs 1.3 (L) 1.7 - 7.7 K/uL   Lymphocytes Relative 35 12 - 46 %   Lymphs Abs 1.7 0.7 - 4.0 K/uL   Monocytes Relative 36 (H) 3 - 12 %   Monocytes Absolute 1.7 (H) 0.1 - 1.0 K/uL   Eosinophils Relative 1 0 - 5 %   Eosinophils Absolute 0.0 0.0 - 0.7 K/uL   Basophils Relative 1 0 - 1 %   Basophils Absolute 0.0 0.0 - 0.1 K/uL  Glucose, capillary     Status:  Abnormal   Collection Time: 08/30/14  7:37 AM  Result Value Ref Range   Glucose-Capillary 100 (H) 65 - 99 mg/dL    Imaging / Studies: Dg Chest Port 1 View  08/28/2014   CLINICAL DATA:  Chest pain, tachycardia, history of colon cancer  EXAM: PORTABLE CHEST - 1 VIEW  COMPARISON:  08/24/2014  FINDINGS: Lungs are clear.  No pleural effusion or pneumothorax.  The heart is normal in size.  Left chest port terminates at the cavoatrial junction.  IMPRESSION: No evidence of acute cardiopulmonary disease.   Electronically Signed   By: Julian Hy M.D.   On: 08/28/2014 19:54   Dg Abd 2 Views  08/29/2014   CLINICAL DATA:  Recent removal nasogastric tube for small bowel obstruction. Persistent nausea and vomiting.  EXAM: ABDOMEN - 2 VIEW  COMPARISON:  Abdominal radiographs 08/28/2014 and 08/26/2014. Acute abdominal series 08/24/2014.  FINDINGS: Prior Port-A-Cath projects over the left chest, with the distal tip projecting over the distal superior vena cava. The lung bases are clear. Heart size is normal.  There are scattered air-fluid levels within small bowel and colon on the upright view. No evidence of pneumoperitoneum. Air-fluid level is noted in the gastric fundus.  Gaseous distention of a central small bowel loop appears decreased compared to the radiographs of 08/28/2014. Small bowel now measures 2.5 cm (previously 3.8 cm in this region). Gas is seen within a bowel loop in the right lower quadrant, projecting over the right iliac bone and right sacrum; it is difficult to determine if this is dilated small bowel or colon. There is a moderate amount of stool within the colon; overall, colonic stool burden has decreased since 08/26/2014. A distal colonic stent is again noted.  IMPRESSION: Further improvement in small bowel distention since radiographs dated 08/28/2014.  Colonic stent present.  Decreasing stool burden.   Electronically Signed   By: Curlene Dolphin M.D.   On: 08/29/2014 11:27   Dg Abd Acute  W/chest  08/30/2014   CLINICAL DATA:  Follow-up small bowel obstruction. Intermittent abdominal pain with nausea and vomiting.  EXAM: DG ABDOMEN ACUTE W/ 1V CHEST  COMPARISON:  08/29/2014  FINDINGS: Large stool burden throughout the colon. Continued mildly prominent mid abdominal small bowel loops with scattered air-fluid levels. No free air organomegaly. Presumed colonic stent in the pelvis.  Left Port-A-Cath remains in place, unchanged. Heart is normal size. Mild hyperinflation of the lungs without focal opacity or effusion.  IMPRESSION: Continued mild prominence of mid abdominal small bowel loops with scattered air-fluid levels. Large stool burden throughout the colon. No real change since prior study.   Electronically Signed   By: Rolm Baptise M.D.   On: 08/30/2014 10:34    Medications / Allergies: per chart  Antibiotics: Anti-infectives    Start     Dose/Rate Route Frequency Ordered Stop   08/30/14 1217  clindamycin (CLEOCIN) IVPB 900 mg     900 mg 100 mL/hr over 30 Minutes Intravenous 60 min pre-op 08/30/14 1218     08/30/14 1217  gentamicin (GARAMYCIN) 220 mg in dextrose 5 % 100 mL IVPB     5 mg/kg  44.1 kg 105.5 mL/hr over 60 Minutes Intravenous 60 min pre-op 08/30/14 1218     08/25/14 1800  imipenem-cilastatin (PRIMAXIN) 250 mg in sodium chloride 0.9 % 100 mL IVPB     250 mg 200 mL/hr over 30 Minutes Intravenous Every 6 hours 08/25/14 1716     08/25/14 1800  metroNIDAZOLE (FLAGYL) IVPB 500 mg  Status:  Discontinued     500 mg 100 mL/hr over 60 Minutes Intravenous Every 8 hours 08/25/14 1716 08/27/14 1003        Note: Portions of this report may have been transcribed using voice recognition software. Every effort was made to ensure accuracy; however, inadvertent computerized transcription errors may be present.   Any transcriptional errors that result from this process are unintentional.     Adin Hector, M.D., F.A.C.S. Gastrointestinal and Minimally Invasive  Surgery [facility] [address], [place], [place] [address] [phone] Main / Paging   08/30/2014  CARE TEAM:  PCP: Delphina Cahill, MD  Outpatient Care Team: Patient Care Team: Delphina Cahill, MD as PCP - General (Internal Medicine)  Inpatient Treatment Team: Treatment Team: Attending Provider: Hosie Poisson, MD; Attending Physician: Robbie Lis, MD; Technician: Kirstie Peri, NT; Rounding Team: Garner Gavel, MD; Consulting Physician: Ladell Pier, MD; Consulting Physician: Nolon Nations, MD; Technician: Emmaline Kluver, NT; Technician: Ennis Forts, NT; Registered Nurse: Richrd Humbles, RN; Technician: Milagros Reap, NT; Technician: Mertha Baars, NT; Consulting Physician: Michae Kava Lbcardiology, MD; Technician: Carlisle Beers, NT

## 2014-08-30 NOTE — Progress Notes (Signed)
Patient Profile: 59 y/o female with rectal cancer + liver metastasis and no prior cardiac history, admitted 08/25/14 for SBO. Cardiology consulted for chest pain and atrial flutter with RVR (in the setting of severe hypokalemia with K of 2.8)  Subjective: Patient notes recurrence in symptoms ~5:30 am earlier today that occurred during episode of emesis. This correlates with telemetry findings of recurrent rapid atrial flutter. She noted tachy-palpitations and chest discomfort, short lived and resolved after emesis resolved. No further recurrence. Currently asymptomatic. No further vomiting. She notes diarrhea last PM with BM x 4.   Objective: Vital signs in last 24 hours: Temp:  [97.6 F (36.4 C)-98.8 F (37.1 C)] 97.6 F (36.4 C) (06/01 0628) Pulse Rate:  [70-76] 76 (06/01 0628) Resp:  [18] 18 (06/01 0628) BP: (122-129)/(54-67) 124/63 mmHg (06/01 0628) SpO2:  [99 %-100 %] 99 % (06/01 0628) Last BM Date: 08/29/14  Intake/Output from previous day: 05/31 0701 - 06/01 0700 In: 1812.3 [P.O.:60; I.V.:1252.3; IV Piggyback:500] Out: 2100 [Emesis/NG output:2100] Intake/Output this shift:    Medications Current Facility-Administered Medications  Medication Dose Route Frequency Provider Last Rate Last Dose  . 0.9 %  sodium chloride infusion   Intravenous Continuous Polly Cobia, RPH 70 mL/hr at 08/29/14 1801    . acetaminophen (TYLENOL) tablet 650 mg  650 mg Oral Q6H PRN Robbie Lis, MD       Or  . acetaminophen (TYLENOL) suppository 650 mg  650 mg Rectal Q6H PRN Robbie Lis, MD   650 mg at 08/26/14 1342  . antiseptic oral rinse (CPC / CETYLPYRIDINIUM CHLORIDE 0.05%) solution 7 mL  7 mL Mouth Rinse BID Robbie Lis, MD   7 mL at 08/29/14 2200  . aspirin chewable tablet 81 mg  81 mg Oral Daily Hosie Poisson, MD   81 mg at 08/28/14 1818  . TPN (CLINIMIX-E) Adult   Intravenous Continuous TPN Drew A Wofford, RPH       And  . fat emulsion 20 % infusion 240 mL  240 mL Intravenous  Continuous TPN Drew A Wofford, RPH      . imipenem-cilastatin (PRIMAXIN) 250 mg in sodium chloride 0.9 % 100 mL IVPB  250 mg Intravenous Q6H Anh P Pham, RPH   250 mg at 08/30/14 0620  . LORazepam (ATIVAN) injection 1 mg  1 mg Intravenous Q6H PRN Robbie Lis, MD   1 mg at 08/30/14 0620  . metoCLOPramide (REGLAN) tablet 5-10 mg  5-10 mg Oral Q6H PRN Michael Boston, MD      . nicotine (NICODERM CQ - dosed in mg/24 hours) patch 21 mg  21 mg Transdermal Daily Hosie Poisson, MD   21 mg at 08/29/14 1004  . nitroGLYCERIN (NITROSTAT) SL tablet 0.4 mg  0.4 mg Sublingual Q5 min PRN Hosie Poisson, MD      . ondansetron (ZOFRAN) injection 4 mg  4 mg Intravenous Q6H PRN Michael Boston, MD   4 mg at 08/30/14 9983   Or  . ondansetron (ZOFRAN) 8 mg in sodium chloride 0.9 % 50 mL IVPB  8 mg Intravenous Q6H PRN Michael Boston, MD      . ondansetron (ZOFRAN-ODT) disintegrating tablet 4-8 mg  4-8 mg Oral Q6H PRN Michael Boston, MD      . pantoprazole (PROTONIX) injection 40 mg  40 mg Intravenous QHS Ritta Slot, NP   40 mg at 08/29/14 2131  . phenol (CHLORASEPTIC) mouth spray 2 spray  2 spray Mouth/Throat PRN Leighton Ruff, MD  2 spray at 08/26/14 1600  . promethazine (PHENERGAN) injection 6.25-12.5 mg  6.25-12.5 mg Intravenous Q4H PRN Michael Boston, MD      . sodium chloride 0.9 % injection 10-40 mL  10-40 mL Intracatheter Q12H Robbie Lis, MD   10 mL at 08/29/14 2343  . sodium chloride 0.9 % injection 10-40 mL  10-40 mL Intracatheter PRN Robbie Lis, MD   10 mL at 08/30/14 0600  . sodium chloride 0.9 % injection 3 mL  3 mL Intravenous Q12H Robbie Lis, MD   3 mL at 08/29/14 2343    PE: General appearance: alert, cooperative, no distress and frail appearing Neck: no carotid bruit and no JVD Lungs: clear to auscultation bilaterally Heart: regular rate and rhythm, S1, S2 normal, no murmur, click, rub or gallop Extremities: no LEE Pulses: 2+ and symmetric Skin: warm and dry Neurologic: Grossly normal  Lab  Results:   Recent Labs  08/27/14 1020 08/29/14 1000 08/30/14 0600  WBC 2.3* 3.8* 4.7  HGB 9.2* 8.3* 10.4*  HCT 29.3* 26.6* 33.0*  PLT 124* 130* 146*   BMET  Recent Labs  08/28/14 1735 08/29/14 1000 08/30/14 0600  NA 144 147* 147*  K 2.8* 3.0* 3.2*  CL 110 114* 110  CO2 20* 21* 22  GLUCOSE 105* 98 98  BUN 47* 42* 39*  CREATININE 0.61 0.51 0.63  CALCIUM 8.6* 8.8* 9.2   Cardiac Panel (last 3 results)  Recent Labs  08/28/14 1735 08/28/14 2255 08/29/14 0455  TROPONINI <0.03 0.04* <0.03    Studies/Results: 2D Echo 08/29/14  Study Conclusions  - Left ventricle: The cavity size was normal. Wall thickness was normal. Systolic function was normal. The estimated ejection fraction was in the range of 55% to 60%. Wall motion was normal; there were no regional wall motion abnormalities. Codominant filling pattern - borderline diastolic dysfunction. - Mitral valve: Mildly thickened leaflets . There was trace to mild regurgitation. - Left atrium: The atrium was normal in size.  Impressions:  - LVEF 55-60%, normal wall thickness, normal wall motion, borderline diastolic dysfunction, trace to mild MR, normal LA size.   Assessment/Plan  Active Problems:   Fever   Liver lesion   Dehydration   Nausea and vomiting   SBO (small bowel obstruction)   Bowel perforation   1. Chest Pain: resolved. Now CP free. Likely secondary to atrial flutter w/ RVR. Cardiac enzymes cycled x 3 with flat trend. Only slight bump in troponin to 0.04 x 1. 2D echo shows normal LV function, wall motion and valve anatomy. Would not recommend any further cardiac w/u at this time.   2. Atrial Flutter w/ RVR: occurred in the setting of multiple medical issues including hypokalemia, resolving SBO, metastatic rectal cancer, diarrhea and emesis. She had a recurrent but brief symptomatic episode ~5:30 am today during and episode of emesis. Max rate >200. Symptoms resolved after emesis  resolved. She denies further recurrence. No further episodes on telemetry since 5:40 am. Now maintaining NSR on telemetry. Keep K WNL. Continue to monitor on telemetry. If recurrent sustained episodes, can treat with IV metoprolol for rate control.  3. Hypokalemia: K is improved but still low at 3.2. Mg WNL at 2.2. Not tolerating PO well. Recommend IV supplementation. Continue to monitor closely.   4. SBO: resolving. NGT discontinued 5/30. Continue management per General Surgery.  5. Rectal CA w/ Liver Mets: management per oncology.     LOS: 5 days    Brittainy M. Rosita Fire, PA-C 08/30/2014  7:48 AM Patient seen and examined and history reviewed. Agree with above findings and plan. Persistent N/V. Unable to hold pos down. Had recurrent Aflutter with RVR this am while vomiting. Potassium improved but still low at 3.2. Continue to replete potassium. Echo is normal. If recurrent sustained Atrial flutter could use IV metoprolol prn or start IV amiodarone for short term management. Surgery planned tomorrow for refractory SOBO.   Michal Strzelecki [place], [place] 08/30/2014 1:47 PM

## 2014-08-30 NOTE — Consult Note (Signed)
WOC ostomy consult note Preoperative stoma site selection per Dr. Johney Maine' request; both loop ileostomy and colostomy sites marked.  Abdomen assessed in the lying and sitting positions; the patient is unable to stand at the moment due to nausea. Flat abdomen free of scars or adipose tissue noted. Patient is wearing an adult brief at level below possible markings (low).  Ileostomy site is in the RUQ, 7cm to the right of the umbilicus and 6.0RV above.  Colostomy site is in the LLQ, 6.8cm to the left and 3cm below in flat planes free of wrinkles or creases. Sites are marked with a surgical skin marking pen and are covered with a thin film transparent dressing.  Patient and husband are aware of the role of an ostomy nurse in the immediate post operative period. They listen to some preliminary information and decline written material at this time. [place] nursing team will not follow, but will remain available to this patient, the nursing, surgical and medical teams.  Please re-consult if a stoma is created in the intraoperative period. Thanks, Maudie Flakes, MSN, RN, [place], [place], [place] [phone])

## 2014-08-30 NOTE — Progress Notes (Signed)
ANTIBIOTIC CONSULT NOTE - FOLLOW UP  Pharmacy Consult for primaxin, renally adjust antibiotics Indication: Intra-abdominal Infection, surgical prophylaxis  Allergies  Allergen Reactions  . Avelox [Moxifloxacin Hcl In Nacl] Hives  . Codeine Hives  . Dextromethorphan Hives  . Erythromycin Hives  . Penicillins Hives  . Suprep [Na Sulfate-K Sulfate-Mg Sulf] Nausea And Vomiting    Patient Measurements: Height: 5\' 7"  (170.2 cm) Weight: 97 lb 3.6 oz (44.1 kg) IBW/kg (Calculated) : 61.6  Vital Signs: Temp: 98.3 F (36.8 C) (06/01 1503) Temp Source: Oral (06/01 1503) BP: 136/83 mmHg (06/01 1503) Pulse Rate: 74 (06/01 1503) Intake/Output from previous day: 05/31 0701 - 06/01 0700 In: 1812.3 [P.O.:60; I.V.:1252.3; IV Piggyback:500] Out: 2100 [Emesis/NG output:2100] Intake/Output from this shift:    Labs:  Recent Labs  08/28/14 1735 08/29/14 1000 08/30/14 0600  WBC  --  3.8* 4.7  HGB  --  8.3* 10.4*  PLT  --  130* 146*  CREATININE 0.61 0.51 0.63   Estimated Creatinine Clearance: 54 mL/min (by C-G formula based on Cr of 0.63). No results for input(s): VANCOTROUGH, VANCOPEAK, VANCORANDOM, GENTTROUGH, GENTPEAK, GENTRANDOM, TOBRATROUGH, TOBRAPEAK, TOBRARND, AMIKACINPEAK, AMIKACINTROU, AMIKACIN in the last 72 hours.   Microbiology: Recent Results (from the past 720 hour(s))  Culture, blood (routine x 2)     Status: None (Preliminary result)   Collection Time: 08/26/14  7:05 AM  Result Value Ref Range Status   Specimen Description BLOOD RIGHT ARM  Final   Special Requests   Final    BOTTLES DRAWN AEROBIC AND ANAEROBIC 10CC BOTH BOTTLES   Culture   Final           BLOOD CULTURE RECEIVED NO GROWTH TO DATE CULTURE WILL BE HELD FOR 5 DAYS BEFORE ISSUING A FINAL NEGATIVE REPORT Performed at [facility]    Report Status PENDING  Incomplete  Culture, blood (routine x 2)     Status: None (Preliminary result)   Collection Time: 08/26/14  7:10 AM  Result Value Ref Range  Status   Specimen Description BLOOD LEFT ARM  Final   Special Requests   Final    BOTTLES DRAWN AEROBIC AND ANAEROBIC 10CC BOTH BOTTLES   Culture   Final           BLOOD CULTURE RECEIVED NO GROWTH TO DATE CULTURE WILL BE HELD FOR 5 DAYS BEFORE ISSUING A FINAL NEGATIVE REPORT Performed at [facility]    Report Status PENDING  Incomplete    Anti-infectives    Start     Dose/Rate Route Frequency Ordered Stop   08/31/14 0600  clindamycin (CLEOCIN) IVPB 900 mg     900 mg 100 mL/hr over 30 Minutes Intravenous 60 min pre-op 08/30/14 1218     08/31/14 0600  gentamicin (GARAMYCIN) 220 mg in dextrose 5 % 100 mL IVPB     5 mg/kg  44.1 kg 105.5 mL/hr over 60 Minutes Intravenous 60 min pre-op 08/30/14 1218     08/25/14 1800  imipenem-cilastatin (PRIMAXIN) 250 mg in sodium chloride 0.9 % 100 mL IVPB     250 mg 200 mL/hr over 30 Minutes Intravenous Every 6 hours 08/25/14 1716     08/25/14 1800  metroNIDAZOLE (FLAGYL) IVPB 500 mg  Status:  Discontinued     500 mg 100 mL/hr over 60 Minutes Intravenous Every 8 hours 08/25/14 1716 08/27/14 1003      Assessment: Patient's a 59 y.o F with rectal cancer currently undergoing chemotherapy treatment. She was seen and treated in the  ED for nausea on 5/26. She presented back to [facility] on 5/27 for further workup and management of persistent n/v. Abx were started empirically for suspected intra-abd infection. Of note, patient is allergic to PCN but tolerated primaxin in the past.  5/27 primaxin >> 5/27 flagyl >> 5/29  Afeb since 5/29 WBC wnl SCr wnl, stable (CrCl~54 using SCr 0.8)  5/28 bc x2: ngtd   Goal of Therapy:  Eradication of infection Appropriate antibiotic dosing for indication and renal function  Plan:  Day 6 antibiotics  Reduce Primaxin to 250 mg IV q8 hr with new lower weight documented  Ordered Gentamicin 5 mg/kg IV and Clindamycin 900 mg IV x 1 tomorrow prior to procedure; dosing appropriate.  Follow clinical course,  renal function, culture results as available  Follow for de-escalation of antibiotics and LOT   Reuel Boom, PharmD Pager: [phone] 08/30/2014, 5:35 PM

## 2014-08-31 ENCOUNTER — Inpatient Hospital Stay (HOSPITAL_COMMUNITY): Payer: 59

## 2014-08-31 ENCOUNTER — Encounter (HOSPITAL_COMMUNITY)
Admission: AD | Disposition: A | Discharge status: Home Health | Payer: Self-pay | Source: Ambulatory Visit | Attending: Internal Medicine

## 2014-08-31 ENCOUNTER — Inpatient Hospital Stay (HOSPITAL_COMMUNITY): Payer: 59 | Admitting: Certified Registered"

## 2014-08-31 ENCOUNTER — Encounter (HOSPITAL_COMMUNITY): Payer: Self-pay | Admitting: Anesthesiology

## 2014-08-31 DIAGNOSIS — I959 Hypotension, unspecified: Secondary | ICD-10-CM

## 2014-08-31 DIAGNOSIS — I9589 Other hypotension: Secondary | ICD-10-CM

## 2014-08-31 HISTORY — PX: TRANSVERSE COLON RESECTION: SHX6155

## 2014-08-31 HISTORY — PX: DEBULKING: SHX6277

## 2014-08-31 HISTORY — PX: BOWEL RESECTION: SHX1257

## 2014-08-31 HISTORY — PX: LAPAROTOMY: SHX154

## 2014-08-31 HISTORY — PX: COLOSTOMY: SHX63

## 2014-08-31 HISTORY — PX: SUPRACERVICAL ABDOMINAL HYSTERECTOMY: SHX5393

## 2014-08-31 HISTORY — PX: APPENDECTOMY: SHX54

## 2014-08-31 LAB — BASIC METABOLIC PANEL
Anion gap: 6 (ref 5–15)
BUN: 29 mg/dL — AB (ref 6–20)
CO2: 25 mmol/L (ref 22–32)
Calcium: 7.5 mg/dL — ABNORMAL LOW (ref 8.9–10.3)
Chloride: 108 mmol/L (ref 101–111)
Creatinine, Ser: 0.74 mg/dL (ref 0.44–1.00)
GFR calc Af Amer: >60 mL/min (ref >60)
GFR calc non Af Amer: >60 mL/min (ref >60)
Glucose, Bld: 248 mg/dL — ABNORMAL HIGH (ref 65–99)
POTASSIUM: 3.4 mmol/L — AB (ref 3.5–5.1)
SODIUM: 139 mmol/L (ref 135–145)

## 2014-08-31 LAB — COMPREHENSIVE METABOLIC PANEL
ALT: 16 U/L (ref 14–54)
AST: 22 U/L (ref 15–41)
Albumin: 3.5 g/dL (ref 3.5–5.0)
Alkaline Phosphatase: 78 U/L (ref 38–126)
Anion gap: 12 (ref 5–15)
BUN: 41 mg/dL — ABNORMAL HIGH (ref 6–20)
CO2: 28 mmol/L (ref 22–32)
Calcium: 9.4 mg/dL (ref 8.9–10.3)
Chloride: 103 mmol/L (ref 101–111)
Creatinine, Ser: 0.46 mg/dL (ref 0.44–1.00)
GFR calc Af Amer: >60 mL/min (ref >60)
GFR calc non Af Amer: >60 mL/min (ref >60)
Glucose, Bld: 166 mg/dL — ABNORMAL HIGH (ref 65–99)
Potassium: 2.9 mmol/L — ABNORMAL LOW (ref 3.5–5.1)
Sodium: 143 mmol/L (ref 135–145)
Total Bilirubin: 0.5 mg/dL (ref 0.3–1.2)
Total Protein: 7 g/dL (ref 6.5–8.1)

## 2014-08-31 LAB — HEMOGLOBIN A1C
Hgb A1c MFr Bld: 5.9 % — ABNORMAL HIGH (ref 4.8–5.6)
Mean Plasma Glucose: 123 mg/dL

## 2014-08-31 LAB — CBC
HCT: 19.5 % — ABNORMAL LOW (ref 36.0–46.0)
HEMOGLOBIN: 6.1 g/dL — AB (ref 12.0–15.0)
MCH: 26.8 pg (ref 26.0–34.0)
MCHC: 31.3 g/dL (ref 30.0–36.0)
MCV: 85.5 fL (ref 78.0–100.0)
PLATELETS: 99 10*3/uL — AB (ref 150–400)
RBC: 2.28 MIL/uL — AB (ref 3.87–5.11)
RDW: 18.5 % — ABNORMAL HIGH (ref 11.5–15.5)
WBC: 14 10*3/uL — ABNORMAL HIGH (ref 4.0–10.5)

## 2014-08-31 LAB — SURGICAL PCR SCREEN
MRSA, PCR: NEGATIVE
STAPHYLOCOCCUS AUREUS: NEGATIVE

## 2014-08-31 LAB — GLUCOSE, CAPILLARY
Glucose-Capillary: 167 mg/dL — ABNORMAL HIGH (ref 65–99)
Glucose-Capillary: 162 mg/dL — ABNORMAL HIGH (ref 65–99)
Glucose-Capillary: 230 mg/dL — ABNORMAL HIGH (ref 65–99)
Glucose-Capillary: 221 mg/dL — ABNORMAL HIGH (ref 65–99)

## 2014-08-31 LAB — PHOSPHORUS: Phosphorus: 3 mg/dL (ref 2.5–4.6)

## 2014-08-31 LAB — MAGNESIUM: Magnesium: 2.3 mg/dL (ref 1.7–2.4)

## 2014-08-31 LAB — PREPARE RBC (CROSSMATCH)

## 2014-08-31 LAB — ABO/RH: ABO/RH(D): A POS

## 2014-08-31 SURGERY — LAPAROTOMY, EXPLORATORY
Anesthesia: General | Site: Abdomen

## 2014-08-31 MED ORDER — ALBUMIN HUMAN 5 % IV SOLN
12.5000 g | Freq: Once | INTRAVENOUS | Status: DC
  Filled 2014-08-31: qty 250

## 2014-08-31 MED ORDER — ALBUMIN HUMAN 5 % IV SOLN
12.5000 g | Freq: Once | INTRAVENOUS | Status: AC
  Administered 2014-08-31: 12.5 g via INTRAVENOUS

## 2014-08-31 MED ORDER — ONDANSETRON HCL 4 MG/2ML IJ SOLN
4.0000 mg | Freq: Four times a day (QID) | INTRAMUSCULAR | Status: DC | PRN
Start: 2014-08-31 — End: 2014-09-01

## 2014-08-31 MED ORDER — ALBUMIN HUMAN 5 % IV SOLN
INTRAVENOUS | Status: AC
Start: 2014-08-31 — End: 2014-08-31
  Filled 2014-08-31: qty 250

## 2014-08-31 MED ORDER — FAT EMULSION 20 % IV EMUL
240.0000 mL | INTRAVENOUS | Status: DC
  Administered 2014-08-31: 240 mL via INTRAVENOUS
  Filled 2014-08-31: qty 250

## 2014-08-31 MED ORDER — FENTANYL CITRATE (PF) 100 MCG/2ML IJ SOLN
INTRAMUSCULAR | Status: DC | PRN
  Administered 2014-08-31: 50 ug via INTRAVENOUS
  Administered 2014-08-31: 100 ug via INTRAVENOUS

## 2014-08-31 MED ORDER — SODIUM CHLORIDE 0.9 % IV SOLN
INTRAVENOUS | Status: DC
  Filled 2014-08-31: qty 6

## 2014-08-31 MED ORDER — TRACE MINERALS CR-CU-MN-SE-ZN 10-1000-500-60 MCG/ML IV SOLN
INTRAVENOUS | Status: AC
  Administered 2014-08-31: 17:00:00 via INTRAVENOUS
  Filled 2014-08-31: qty 1440

## 2014-08-31 MED ORDER — LACTATED RINGERS IV SOLN
INTRAVENOUS | Status: DC | PRN
  Administered 2014-08-31 (×3): via INTRAVENOUS

## 2014-08-31 MED ORDER — SODIUM CHLORIDE 0.9 % IJ SOLN: 9.0000 mL | INTRAMUSCULAR | Status: DC | PRN

## 2014-08-31 MED ORDER — POTASSIUM CHLORIDE 10 MEQ/50ML IV SOLN
10.0000 meq | INTRAVENOUS | Status: AC
  Administered 2014-08-31 – 2014-09-01 (×3): 10 meq via INTRAVENOUS
  Filled 2014-08-31: qty 50

## 2014-08-31 MED ORDER — SODIUM CHLORIDE 0.9 % IV SOLN
Freq: Once | INTRAVENOUS | Status: AC
  Administered 2014-08-31: 23:00:00 via INTRAVENOUS

## 2014-08-31 MED ORDER — LACTATED RINGERS IV SOLN
INTRAVENOUS | Status: DC | PRN
Start: 2014-08-31 — End: 2014-08-31
  Administered 2014-08-31 (×4): via INTRAVENOUS

## 2014-08-31 MED ORDER — DIPHENHYDRAMINE HCL 12.5 MG/5ML PO ELIX
12.5000 mg | ORAL_SOLUTION | Freq: Four times a day (QID) | ORAL | Status: DC | PRN
Start: 2014-08-31 — End: 2014-09-01

## 2014-08-31 MED ORDER — LACTATED RINGERS IV BOLUS (SEPSIS): 1000.0000 mL | Freq: Three times a day (TID) | INTRAVENOUS | Status: DC | PRN

## 2014-08-31 MED ORDER — METHYLENE BLUE 1 % INJ SOLN
INTRAMUSCULAR | Status: AC
  Filled 2014-08-31: qty 10

## 2014-08-31 MED ORDER — ARTIFICIAL TEARS OP OINT
TOPICAL_OINTMENT | OPHTHALMIC | Status: AC
  Filled 2014-08-31: qty 3.5

## 2014-08-31 MED ORDER — ROCURONIUM BROMIDE 100 MG/10ML IV SOLN
INTRAVENOUS | Status: DC | PRN
  Administered 2014-08-31: 10 mg via INTRAVENOUS
  Administered 2014-08-31: 30 mg via INTRAVENOUS
  Administered 2014-08-31 (×4): 10 mg via INTRAVENOUS

## 2014-08-31 MED ORDER — ONDANSETRON HCL 4 MG/2ML IJ SOLN
INTRAMUSCULAR | Status: AC
  Filled 2014-08-31: qty 2

## 2014-08-31 MED ORDER — ONDANSETRON HCL 4 MG/2ML IJ SOLN
INTRAMUSCULAR | Status: DC | PRN
  Administered 2014-08-31: 4 mg via INTRAVENOUS

## 2014-08-31 MED ORDER — SODIUM CHLORIDE 0.9 % IV SOLN: INTRAVENOUS | Status: DC

## 2014-08-31 MED ORDER — PHENYLEPHRINE 40 MCG/ML (10ML) SYRINGE FOR IV PUSH (FOR BLOOD PRESSURE SUPPORT)
PREFILLED_SYRINGE | INTRAVENOUS | Status: AC
Start: 2014-08-31 — End: 2014-08-31
  Filled 2014-08-31: qty 10

## 2014-08-31 MED ORDER — HYDROMORPHONE HCL 1 MG/ML IJ SOLN
0.2500 mg | INTRAMUSCULAR | Status: DC | PRN
  Administered 2014-08-31: 0.25 mg via INTRAVENOUS
  Administered 2014-08-31: 0.5 mg via INTRAVENOUS
  Administered 2014-08-31: 0.25 mg via INTRAVENOUS

## 2014-08-31 MED ORDER — 0.9 % SODIUM CHLORIDE (POUR BTL) OPTIME
TOPICAL | Status: DC | PRN
  Administered 2014-08-31: 4000 mL

## 2014-08-31 MED ORDER — SODIUM CHLORIDE 0.9 % IV BOLUS (SEPSIS)
1000.0000 mL | Freq: Once | INTRAVENOUS | Status: AC
  Administered 2014-08-31: 1000 mL via INTRAVENOUS

## 2014-08-31 MED ORDER — BUPIVACAINE-EPINEPHRINE 0.25% -1:200000 IJ SOLN
INTRAMUSCULAR | Status: DC | PRN
  Administered 2014-08-31: 50 mL

## 2014-08-31 MED ORDER — BUPIVACAINE 0.25 % ON-Q PUMP DUAL CATH 300 ML
INJECTION | Status: DC | PRN
  Administered 2014-08-31: 300 mL

## 2014-08-31 MED ORDER — POTASSIUM CHLORIDE 10 MEQ/100ML IV SOLN: 10.0000 meq | INTRAVENOUS | Status: DC

## 2014-08-31 MED ORDER — ALBUMIN HUMAN 5 % IV SOLN
25.0000 g | Freq: Once | INTRAVENOUS | Status: DC
  Filled 2014-08-31: qty 500

## 2014-08-31 MED ORDER — LACTATED RINGERS IV BOLUS (SEPSIS)
1000.0000 mL | Freq: Once | INTRAVENOUS | Status: AC
  Administered 2014-08-31 (×2): 1000 mL via INTRAVENOUS

## 2014-08-31 MED ORDER — MIDAZOLAM HCL 5 MG/5ML IJ SOLN
INTRAMUSCULAR | Status: DC | PRN
  Administered 2014-08-31 (×2): 1 mg via INTRAVENOUS

## 2014-08-31 MED ORDER — STERILE WATER FOR IRRIGATION IR SOLN
Status: DC | PRN
  Administered 2014-08-31: 1500 mL

## 2014-08-31 MED ORDER — SODIUM CHLORIDE 0.9 % IV SOLN
1.0000 g | Freq: Once | INTRAVENOUS | Status: AC
  Administered 2014-08-31: 1 g via INTRAVENOUS
  Filled 2014-08-31: qty 10

## 2014-08-31 MED ORDER — SODIUM CHLORIDE 0.9 % IV SOLN
INTRAVENOUS | Status: AC
  Administered 2014-08-31: 20:00:00 via INTRAVENOUS
  Administered 2014-09-01: 75 mL/h via INTRAVENOUS
  Administered 2014-09-04 – 2014-09-06 (×4): via INTRAVENOUS
  Administered 2014-09-06: 1000 mL via INTRAVENOUS
  Administered 2014-09-08 – 2014-09-16 (×13): via INTRAVENOUS

## 2014-08-31 MED ORDER — FENTANYL CITRATE (PF) 250 MCG/5ML IJ SOLN
INTRAMUSCULAR | Status: AC
  Filled 2014-08-31: qty 5

## 2014-08-31 MED ORDER — PHENYLEPHRINE HCL 10 MG/ML IJ SOLN
INTRAMUSCULAR | Status: DC | PRN
  Administered 2014-08-31 (×3): 80 ug via INTRAVENOUS

## 2014-08-31 MED ORDER — HEPARIN SODIUM (PORCINE) 5000 UNIT/ML IJ SOLN: 5000.0000 [IU] | Freq: Three times a day (TID) | INTRAMUSCULAR | Status: DC

## 2014-08-31 MED ORDER — HYDROMORPHONE 0.3 MG/ML IV SOLN
INTRAVENOUS | Status: DC
  Administered 2014-08-31: 23:00:00 via INTRAVENOUS
  Administered 2014-09-01: 0.79 mg via INTRAVENOUS
  Filled 2014-08-31: qty 25

## 2014-08-31 MED ORDER — BUPIVACAINE 0.25 % ON-Q PUMP DUAL CATH 300 ML
300.0000 mL | INJECTION | Status: DC
  Filled 2014-08-31: qty 300

## 2014-08-31 MED ORDER — BUPIVACAINE-EPINEPHRINE 0.25% -1:200000 IJ SOLN
INTRAMUSCULAR | Status: AC
  Filled 2014-08-31: qty 1

## 2014-08-31 MED ORDER — LIDOCAINE HCL (PF) 2 % IJ SOLN
INTRAMUSCULAR | Status: DC | PRN
  Administered 2014-08-31: 20 mg via INTRADERMAL

## 2014-08-31 MED ORDER — HYDROMORPHONE HCL 2 MG/ML IJ SOLN
INTRAMUSCULAR | Status: AC
  Filled 2014-08-31: qty 1

## 2014-08-31 MED ORDER — PROPOFOL 10 MG/ML IV BOLUS
INTRAVENOUS | Status: AC
  Filled 2014-08-31: qty 20

## 2014-08-31 MED ORDER — PROPOFOL 10 MG/ML IV BOLUS
INTRAVENOUS | Status: DC | PRN
  Administered 2014-08-31: 100 mg via INTRAVENOUS

## 2014-08-31 MED ORDER — HYDROMORPHONE HCL 1 MG/ML IJ SOLN
INTRAMUSCULAR | Status: AC
  Filled 2014-08-31: qty 1

## 2014-08-31 MED ORDER — ALBUMIN HUMAN 5 % IV SOLN
INTRAVENOUS | Status: AC
  Filled 2014-08-31: qty 250

## 2014-08-31 MED ORDER — ALBUMIN HUMAN 5 % IV SOLN
INTRAVENOUS | Status: DC | PRN
  Administered 2014-08-31: 13:00:00 via INTRAVENOUS

## 2014-08-31 MED ORDER — DEXAMETHASONE SODIUM PHOSPHATE 10 MG/ML IJ SOLN
INTRAMUSCULAR | Status: DC | PRN
  Administered 2014-08-31: 10 mg via INTRAVENOUS

## 2014-08-31 MED ORDER — PHENYLEPHRINE HCL 10 MG/ML IJ SOLN
INTRAMUSCULAR | Status: AC
  Filled 2014-08-31: qty 1

## 2014-08-31 MED ORDER — NEOSTIGMINE METHYLSULFATE 10 MG/10ML IV SOLN
INTRAVENOUS | Status: DC | PRN
  Administered 2014-08-31: 3 mg via INTRAVENOUS

## 2014-08-31 MED ORDER — ROCURONIUM BROMIDE 100 MG/10ML IV SOLN
INTRAVENOUS | Status: AC
  Filled 2014-08-31: qty 1

## 2014-08-31 MED ORDER — GLYCOPYRROLATE 0.2 MG/ML IJ SOLN
INTRAMUSCULAR | Status: DC | PRN
  Administered 2014-08-31: 0.4 mg via INTRAVENOUS

## 2014-08-31 MED ORDER — FAT EMULSION 20 % INFUSION TNA - OPTIME
INTRAVENOUS | Status: DC | PRN
  Administered 2014-08-31: 10 mL/h via INTRAVENOUS

## 2014-08-31 MED ORDER — MIDAZOLAM HCL 2 MG/2ML IJ SOLN
INTRAMUSCULAR | Status: AC
  Filled 2014-08-31: qty 2

## 2014-08-31 MED ORDER — DIPHENHYDRAMINE HCL 50 MG/ML IJ SOLN
12.5000 mg | Freq: Four times a day (QID) | INTRAMUSCULAR | Status: DC | PRN
Start: 2014-08-31 — End: 2014-09-01

## 2014-08-31 MED ORDER — DEXTROSE 5 % IV SOLN
10.0000 mg | INTRAVENOUS | Status: DC | PRN
  Administered 2014-08-31: 50 ug/min via INTRAVENOUS

## 2014-08-31 MED ORDER — SUCCINYLCHOLINE CHLORIDE 20 MG/ML IJ SOLN
INTRAMUSCULAR | Status: DC | PRN
  Administered 2014-08-31: 100 mg via INTRAVENOUS

## 2014-08-31 MED ORDER — LACTATED RINGERS IV BOLUS (SEPSIS): 2000.0000 mL | Freq: Once | INTRAVENOUS | Status: DC

## 2014-08-31 MED ORDER — NALOXONE HCL 0.4 MG/ML IJ SOLN
0.4000 mg | INTRAMUSCULAR | Status: DC | PRN
Start: 2014-08-31 — End: 2014-09-01

## 2014-08-31 MED ORDER — FENTANYL CITRATE (PF) 100 MCG/2ML IJ SOLN
INTRAMUSCULAR | Status: AC
  Administered 2014-08-31: 100 ug
  Filled 2014-08-31: qty 2

## 2014-08-31 SURGICAL SUPPLY — 59 items
BLADE EXTENDED COATED 6.5IN (ELECTRODE) IMPLANT
BLADE HEX COATED 2.75 (ELECTRODE) ×10 IMPLANT
CATH KIT ON-Q SILVERSOAK 7.5IN (CATHETERS) ×10 IMPLANT
COUNTER NEEDLE 20 DBL MAG RED (NEEDLE) ×10 IMPLANT
COVER MAYO STAND STRL (DRAPES) ×10 IMPLANT
DRAIN CHANNEL 19F RND (DRAIN) ×5 IMPLANT
DRAPE LAPAROSCOPIC ABDOMINAL (DRAPES) ×5 IMPLANT
DRAPE SHEET LG 3/4 BI-LAMINATE (DRAPES) ×5 IMPLANT
DRAPE UTILITY XL STRL (DRAPES) ×15 IMPLANT
DRAPE WARM FLUID 44X44 (DRAPE) ×5 IMPLANT
DRSG OPSITE POSTOP 4X10 (GAUZE/BANDAGES/DRESSINGS) IMPLANT
DRSG OPSITE POSTOP 4X6 (GAUZE/BANDAGES/DRESSINGS) IMPLANT
DRSG OPSITE POSTOP 4X8 (GAUZE/BANDAGES/DRESSINGS) ×5 IMPLANT
DRSG TEGADERM 4X4.75 (GAUZE/BANDAGES/DRESSINGS) ×5 IMPLANT
ELECT REM PT RETURN 9FT ADLT (ELECTROSURGICAL) ×5
ELECTRODE REM PT RTRN 9FT ADLT (ELECTROSURGICAL) ×3 IMPLANT
EVACUATOR SILICONE 100CC (DRAIN) ×5 IMPLANT
GAUZE SPONGE 4X4 12PLY STRL (GAUZE/BANDAGES/DRESSINGS) ×5 IMPLANT
GLOVE ECLIPSE 8.0 STRL XLNG CF (GLOVE) ×30 IMPLANT
GLOVE INDICATOR 8.0 STRL GRN (GLOVE) ×30 IMPLANT
GOWN STRL REUS W/TWL XL LVL3 (GOWN DISPOSABLE) ×55 IMPLANT
KIT BASIN OR (CUSTOM PROCEDURE TRAY) ×10 IMPLANT
LEGGING LITHOTOMY PAIR STRL (DRAPES) IMPLANT
LIGASURE IMPACT 36 18CM CVD LR (INSTRUMENTS) ×5 IMPLANT
PACK GENERAL/GYN (CUSTOM PROCEDURE TRAY) ×5 IMPLANT
PENCIL BUTTON HOLSTER BLD 10FT (ELECTRODE) ×5 IMPLANT
POUCH DRAINABLE 1PC 2 1/4 FLAT (OSTOMY) ×5 IMPLANT
RELOAD AUTO 90-3.5 TA90 BLE (ENDOMECHANICALS) ×5 IMPLANT
RELOAD PROXIMATE 75MM BLUE (ENDOMECHANICALS) ×10 IMPLANT
SPONGE DRAIN TRACH 4X4 STRL 2S (GAUZE/BANDAGES/DRESSINGS) ×5 IMPLANT
STAPLER CUT CVD 40MM BLUE (STAPLE) ×5 IMPLANT
STAPLER CUT RELOAD BLUE (STAPLE) ×10 IMPLANT
STAPLER GUN LINEAR PROX 60 (STAPLE) IMPLANT
STAPLER PROXIMATE 75MM BLUE (STAPLE) ×10 IMPLANT
STAPLER VISISTAT 35W (STAPLE) ×5 IMPLANT
SUCTION POOLE TIP (SUCTIONS) ×10 IMPLANT
SUT MNCRL AB 4-0 PS2 18 (SUTURE) IMPLANT
SUT NOV 1 T60/GS (SUTURE) IMPLANT
SUT NOVA NAB DX-16 0-1 5-0 T12 (SUTURE) IMPLANT
SUT NOVA T20/GS 25 (SUTURE) IMPLANT
SUT PDS AB 1 CTX 36 (SUTURE) ×10 IMPLANT
SUT PROLENE 2 0 SH DA (SUTURE) ×5 IMPLANT
SUT SILK 2 0 (SUTURE) ×2
SUT SILK 2 0 SH CR/8 (SUTURE) ×5 IMPLANT
SUT SILK 2-0 18XBRD TIE 12 (SUTURE) ×3 IMPLANT
SUT SILK 3 0 (SUTURE) ×2
SUT SILK 3 0 SH CR/8 (SUTURE) ×20 IMPLANT
SUT SILK 3-0 18XBRD TIE 12 (SUTURE) ×3 IMPLANT
SUT VIC AB 2-0 SH 18 (SUTURE) ×5 IMPLANT
SUT VIC AB 2-0 SH 27 (SUTURE) ×10
SUT VIC AB 2-0 SH 27X BRD (SUTURE) ×15 IMPLANT
SUT VIC AB 3-0 SH 18 (SUTURE) ×15 IMPLANT
SUT VICRYL 2 0 18  UND BR (SUTURE) ×2
SUT VICRYL 2 0 18 UND BR (SUTURE) ×3 IMPLANT
TOWEL OR 17X26 10 PK STRL BLUE (TOWEL DISPOSABLE) ×10 IMPLANT
TOWEL OR NON WOVEN STRL DISP B (DISPOSABLE) ×10 IMPLANT
TRAY FOLEY W/METER SILVER 14FR (SET/KITS/TRAYS/PACK) ×5 IMPLANT
TUNNELER SHEATH ON-Q 16GX12 DP (PAIN MANAGEMENT) ×5 IMPLANT
YANKAUER SUCT BULB TIP 10FT TU (MISCELLANEOUS) ×5 IMPLANT

## 2014-08-31 NOTE — Transfer of Care (Signed)
Immediate Anesthesia Transfer of Care Note  Patient: Sydney Morrison  Procedure(s) Performed: Procedure(s): EXPLORATORY LAPAROTOMY (N/A) SMALL BOWEL RESECTION (N/A) low anterior resection with COLOSTOMY (N/A) APPENDECTOMY HYSTERECTOMY SUPRACERVICAL ABDOMINAL BILATERAL TUBES AND OVARIES TRANSVERSE COLON RESECTION DEBULKING OF PERITONEUM  Patient Location: PACU  Anesthesia Type:General  Level of Consciousness:  sedated, patient cooperative and responds to stimulation  Airway & Oxygen Therapy:Patient Spontanous Breathing and Patient connected to face mask oxgen  Post-op Assessment:  Report given to PACU RN and Post -op Vital signs reviewed and stable  Post vital signs:  Reviewed and stable  Last Vitals:  Filed Vitals:   08/31/14 0425  BP: 125/69  Pulse: 82  Temp: 36.6 C  Resp: 20    Complications: No apparent anesthesia complications

## 2014-08-31 NOTE — Anesthesia Postprocedure Evaluation (Signed)
  Anesthesia Post-op Note  Patient: Sydney Morrison  Procedure(s) Performed: Procedure(s) (LRB): EXPLORATORY LAPAROTOMY (N/A) SMALL BOWEL RESECTION (N/A) low anterior resection with COLOSTOMY (N/A) APPENDECTOMY HYSTERECTOMY SUPRACERVICAL ABDOMINAL BILATERAL TUBES AND OVARIES TRANSVERSE COLON RESECTION DEBULKING OF PERITONEUM  Patient Location: PACU  Anesthesia Type: General  Level of Consciousness: awake and alert   Airway and Oxygen Therapy: Patient Spontanous Breathing  Post-op Pain: mild  Post-op Assessment: Post-op Vital signs reviewed, Patient's Cardiovascular Status Stable, Respiratory Function Stable, Patent Airway and No signs of Nausea or vomiting  Last Vitals:  Filed Vitals:   08/31/14 1613  BP:   Pulse:   Temp: 36.4 C  Resp:     Post-op Vital Signs: stable   Complications: No apparent anesthesia complications. Transferred to stepdown unit per plan for close observation. Urine output is low. She has required a lot of IV fluids. 3600 cc from NG tube.

## 2014-08-31 NOTE — Op Note (Addendum)
08/25/2014 - 08/31/2014  2:10 PM  PATIENT:  Sydney Morrison  59 y.o. 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  PROCEDURE:  Procedure(s): EXPLORATORY LAPAROTOMY SMALL BOWEL RESECTION LOW ANTERIOR RECTOSIGMOID RESECTION (including stent) with COLOSTOMY APPENDECTOMY HYSTERECTOMY SUPRACERVICAL ABDOMINAL Salpingo-oophorectomy TRANSVERSE COLON RESECTION WITH PARTIAL OMENTECTOMY DEBULKING OF PERITONEUM  SURGEON:  Surgeon(s): Michael Boston, MD Stark Klein, MD  ASSISTANT:  Karleen Dolphin, PA-student  ANESTHESIA:   local and general  EBL:  Total I/O In: 3329 [I.V.:5000; IV Piggyback:250] Out: 5188 [Urine:75; Other:3600; Blood:250]  Delay start of Pharmacological VTE agent (>24hrs) due to surgical blood loss or risk of bleeding:  no  DRAINS: (19Fr ) Blake drain(s) in the pelvis   SPECIMEN:  Source of Specimen:  1.  Ileum  2.  Appendix  3.  Rectosigmoid with colon stent  4.  Transverse colon with central omentum  5.  Right pelvic wall nodule  6.  Anterior pelvic peritneal nodules  7.  Uterus & ovaries  8.  Left anterior pelvis nodules  DISPOSITION OF SPECIMEN:  PATHOLOGY  COUNTS:  YES  PLAN OF CARE: Admit to inpatient   PATIENT DISPOSITION:  PACU - guarded condition.  INDICATION: 59 year old female diagnosed with metastatic colorectal cancer.  Status post stenting.  Has some suspicious omental caking in the lower abdomen.  Liver metastasis.  Underwent 3 rounds of palliative chemotherapy.  Decrease disease.  However patient developed small bowel obstruction.  Initially improved and worsen.  Transition zone seems to be ileum adherent to the rectosigmoid cancer.  Recommendation made for abdominal exploration with release of obstruction.   Probably need for bowel resection discussed.  Probable need for resection of primary versus diverting colostomy discussed as well.  Patient was to be aggressive and try and remove as much cancer if possible.  Discussed possibility of needing gastrostomy tube is well.  She wished to hold off on that but was not totally against it.  The anatomy & physiology of the digestive tract was discussed.  The pathophysiology of the colon was discussed.  Natural history risks without surgery was discussed.   I feel the risks of no intervention will lead to serious problems that outweigh the operative risks; therefore, I recommended a partial colectomy to remove the pathology.  Minimally invasive (Robotic/Laparoscopic) & open techniques were discussed.   Risks such as bleeding, infection, abscess, leak, reoperation, possible ostomy, hernia, heart attack, stroke, death, and other risks were discussed.  I noted a good likelihood this will help address the problem.   Goals of post-operative recovery were discussed as well.   Need for adequate nutrition, daily bowel regimen and healthy physical activity, to optimize recovery was noted as well. We will work to minimize complications.  Educational materials were available as well.  Questions were answered.  The patient expresses understanding & wishes to proceed with surgery.   OR FINDINGS: Distal ileum with adhesions to the rectosigmoid mesentery and uterus due to carcinomatosis causing point of small bowel obstruction.  Central omental caking adherent to anterior bladder.  Adherent to and probably invading into transverse colon.  Bulky cancer at rectosigmoid junction.  Somewhat contained old perforation.  No new abscess.  Full of hard impacted stool throughout colon  Nodularity to left anterior right anterior and especially over bladder and uterus consistent with carcinomatosis  by frozen section.  No evidence of carcinomatosis above the pelvis.  Liver with bulky  metastatic disease.  DESCRIPTION: .was smooth. Themid omentum was densely adherent to the bladder with bulky nodularity suspicious for carcinomatosis.  Had to free those off to be able to see into the pelvis.  Encountered very dilated small bowel.  Nasogastric tube was advanced. 3/2 L of fluid was milked out of the small bowel in stomach to be able to view the abdomen.  Dilated small bowel could be seen going down to the rectosigmoid colon and hard nodules were felt to the rectosigmoid mesentery as well as to the uterus.  These were freed off.  Patient had first ptosis adhesions to the small bowel.  Small bowel was resected using a 75 GIA stapler  In a side-to-side fashion.  The defect stapled off. Mesentery was transected.  Mesentery laid over staple linewith interrupted silk sutures to protect the staple line.  The rectosigmoid colon was mostly mobile but did havedense adhesions to the uterus anteriorly with carcinomatosis.  On viewing the stent was somewhat exposed consistent with a contained perforation.   Mobilized the rectosigmoid colon the lateral to medial fashion.  Was able to identify and preserve the ureters on both sides.  Freed the mesorectum over the sacrum.  At mid rectum the reectum was much softer.  We decided try and proceed with primary resection.    Transected the superior hemorrhoidal artery/vein but left the left colic artery intact.  Transected at the mid sigmoid colon.  Anteriorly there was nodularity on the uterus is well.  And up transecting at the mid rectum with a contour stapler distal to the stent.  Took the mesorectum as well.  Primary cancer removed.  Try to minimize mobility on the left colon since the  mid sigmoid stump easily reached up to the anterior abdominal wall for a colostomy.  Patient was to be aggressive so we decided to try and remove any remaining disease.  Proceed with transverse colon resection.  Resected about 15 cm of mid transverse colon that was rather  stretched out.  Did that with a 75 stapler and a side-to-side fashion.   debulked a large volume of very hard rocky stool out of the entire colon through the transverse colotomy to help debulk thestool burden markedly.  Close the common defect with a TA 90 stapler to transect the rest of the common channel colon off.  Again using mesentery to help protect the staple line.  Transected the omental caking centrally as well.  No other nodularity in the greater omentum.  Therefore spared.  Please note we changed clothes numerous times after each resection and especially after debulking the colon.  Used to towels intermittently as well to help minimize and protect contamination.  Patient had a nodular carcinomatosis on the peritoneum over bladder.  Seemed like the cavity that was used to wall off the prior perforation  Biopsy consistent with adenocarcinoma on frozen specimen. We resected the peritoneum over the bladder  focused  Cautery dissection and some sharp dissection.  Dissected down up to the cul-de-sac of the uterus. Also had a nodule in the right mid pelvis lateral to the internal ring.  That was excised.  Some along the left anterior pelvis medial to the internal ring near the direct space.  That was transected off as well.   We went and proceeded with hysterectomy since there were moderate nodules on the uterus that would not come off easily and the  left ovary had nodules on it as well.   Transected the Broad ligaments To mobilize the ovaries and fallopian tubes and a lateral to medial fashion.and came and down around the uterus.   The right ureter was coming up close to the right wall of the uterus at first .  We were to mobilize that away.  Skeletonized the uterus more proximally.  We placed 2-0 silk sutures just lateral to the cervix but medial to the ureters.  Then transected the uterus just proximal to the cervix for a supracervical hysterectomy.  Then ran a 2-0 vicryl suture for good hemostasis over  the transected cervix.  We insufflated the bladder with methylene blue.  Dr. Gaynelle Arabian w Urology came into to observe.  We saw no evidence of any bladder leak.  There was some thinned out mucosa though.  Therefore we used 2-0 Vicryl interrupted sutures vertical midline over the dome to help protect that x 5.  We also had some preperitoneal fat that was laid over protected that as well.  He also did another layer of 2-0 Vicryl interrupted sutures to further imbricate the cuff of the supracervical hysterectomy transversely as well.  We did copious irrigation with water.  We did meticulous inspection and there is no evidence of any more nodularity or carcinomatosis anywhere else.  We could feel obvious nodules on the liver but that was smooth and not perforated.  There was not enough omentum to reach down in the pelvis.  With all the anastomoses and did not want to place Seprafilm.  We inspected the entire abdominal cavity so no evidence of injury.  Ureters and other important areas show no injury or abnormality.  We ran the small bowel and allowed the distal small bowel to fall back down into the pelvis.  Try to have the mesentery of the distal ileum and not the bowel itself lay down and the more deeper pelvis.  I matured a colostomy on a left paramedian incision premarked by opening up the abdominal wall in a cruciate fashion.  Brought that out but did not mature it.  Placed On-Q catheters in the epigastric region and tunneled them towards bilateral flanks.  i placed 3-0 silk suture interrupted 3 around the colostomy site on the peritoneal side to help hold in place and seal the peritoneum/fascia around it. I closed the fascia with #1 PDS in a running interrupted fashion after placing a drain.  We decided to leave the skin open Given the long surgery and perforation and isimpacting.  Wound packed with gauze.  Sterile dressing applied.  On-Q catheters placed and sheaths peeled away.  I then matured the  colostomy. I placed 3-0 silk sutures through the the colon at the level of anterior fascia at the 4 corners.  I then matured the colostomy in a Brooke fashion  Had good blood supply.  Using interrupted 3-0 vicryl sutures.  Colostomy came out 3 cm.  A block its placed.  Given the fact patient had fair urine output and came in rather dehydrated, we will watch the stepdown unit to make sure she recovers.  Adin Hector, M.D., F.A.C.S. Gastrointestinal and Minimally Invasive Surgery [facility] [address] [place], [place] [id number] [phone] Main / Paging

## 2014-08-31 NOTE — Progress Notes (Signed)
Nutrition Follow-up  DOCUMENTATION CODES:  Severe malnutrition in context of chronic illness, Underweight  INTERVENTION: - TPN per pharmacy - Encourage PO intake when pt is able - RD to monitor for needs  NUTRITION DIAGNOSIS:  Malnutrition related to chronic illness as evidenced by percent weight loss, severe depletion of body fat, severe depletion of muscle mass. -ongoing  GOAL:  Patient will meet greater than or equal to 90% of their needs -unmet  MONITOR:  Other (Comment), PO intake, Weight trends, Labs, I & O's (TPN)  ASSESSMENT: 59 year old female with past medical history significant for rectal cancer with liver metastasis, rectal stent placement in 06/2014 (under Dr. Gearldine Shown care), status post 3 cycles of chemotherapy, last cycle completed 08/16/2014. She started to develop nausea and vomiting over past 24 hours prior to this admission.  5/29:  - Pt followed by [place], last seen 5/18. - Pt NPO for bowel rest d/t SBO. Pt states that she has not had anything by mouth x 5 days.  - Pt with NGT for suction, output 1000 ml. Recommend nutrition support if bowel rest to last > 7 days.  - Pt at risk for refeeding risk d/t severe malnutrition and poor PO intake PTA. - Pt with 22 lb weight loss since 3/02 (18% weight loss x 3 months, significant for time frame).  6/2: - Pt currently out of room to laparotomy this AM - Visualized tray in room with 100% completion of jello, 2 cups of fluid, and possibly 2-3 bites of pureed eggs - Per notes, pt experienced nausea with sips of clears on 5/31 - Per surgical note yesterday PM, "SBO with recurrent emesis and xray w/o improvement in setting of metastatic rectal cancer." - Pt currently ordered to receive Clinimix E 5/20 @ 40 mL/hr with 20% lipids @ 10 mL/hr; pt is high risk of refeeding - Goal rate for TPN: Clinimix E 5/20 @ 60 mL/hr with 20% lipids @ 10 mL/hr which will provide 72 grams protein, 1747 kcal  Pt not able to  meet needs currently. Medications reviewed. Labs reviewed; CBGs: 100-167 mg/dL, K: 2.9 mmol/L.   Height:  Ht Readings from Last 1 Encounters:  08/25/14 5\' 7"  (1.702 m)    Weight:  Wt Readings from Last 1 Encounters:  08/31/14 102 lb 1.2 oz (46.3 kg)    Ideal Body Weight:  61.3 kg  Wt Readings from Last 10 Encounters:  08/31/14 102 lb 1.2 oz (46.3 kg)  08/25/14 110 lb (49.896 kg)  08/24/14 109 lb 12.8 oz (49.805 kg)  08/24/14 110 lb (49.896 kg)  08/16/14 110 lb 11.2 oz (50.213 kg)  08/02/14 113 lb (51.256 kg)  07/23/14 114 lb 5 oz (51.852 kg)  07/14/14 118 lb 14.4 oz (53.933 kg)  06/29/14 120 lb (54.432 kg)  05/31/14 125 lb (56.7 kg)    BMI:  Body mass index is 15.98 kg/(m^2).  Estimated Nutritional Needs:  Kcal:  1600-1800  Protein:  70-80g  Fluid:  1.6L/day  Skin:  Reviewed, no issues  Diet Order:  Diet NPO time specified Except for: Ice Chips, Sips with Meds TPN (CLINIMIX-E) Adult .TPN (CLINIMIX-E) Adult  EDUCATION NEEDS:  No education needs identified at this time   Intake/Output Summary (Last 24 hours) at 08/31/14 1046 Last data filed at 08/31/14 0600  Gross per 24 hour  Intake 3628.66 ml  Output      0 ml  Net 3628.66 ml    Last BM:  6/2   Jarome Matin, RD,  LDN Inpatient Clinical Dietitian Pager # [phone] After hours/weekend pager # [phone]

## 2014-08-31 NOTE — Consult Note (Signed)
PULMONARY / CRITICAL CARE MEDICINE   Name: Sydney Morrison MRN: [id number] DOB: [birth year]    ADMISSION DATE:  08/25/2014 CONSULTATION DATE:  08/31/2014  REFERRING MD :  Dhungel  CHIEF COMPLAINT:  Hypotension post major abdominal surgery  INITIAL PRESENTATION:  59 y.o. F with SBO that failed conservative treatment.  Taken to OR 6/2 where she had multiple procedures done (ex lap with SBR, low anterior rectosigmoid resection (including stent) with colostomy, appendectomy, supracervical hysterectomy with BSO, and debulking of peritoneum).  Following surgery, she remained hypotensive post op; therefore PCCM consulted for recs.   STUDIES:  CT A/P 5/27 >>> high grade distal SBO, unchanged positioning of distal colonic stent including erosion into the colonic wall, extensive hepatic metastatic disease. Echo 5/31 >>> EF 55 - 60%, trace MR.  SIGNIFICANT EVENTS: 5/27 - admitted with SBO. 5/31 - cards consulted for chest pain and a.flutter with RVR. 6/2 - to OR for ex lap with SBR, low anterior rectosigmoid resection (including stent) with colostomy, appendectomy, supracervical hysterectomy with BSO, and debulking of peritoneum.   HISTORY OF PRESENT ILLNESS:   Sydney Morrison is a 59 y.o. F with PMH as outlined below including rectal CA with liver mets s/p rectal stent placement in April 2016, s/p 3 cycles of chemo (last cycle completed 08/16/14).  She was admitted on 5/27 with SBO which was initially treated conservatively. As of 6/2, SBO had not resolved, therefore, she was taken to the OR for ex lap with SBR, low anterior rectosigmoid resection (including stent) with colostomy, appendectomy, supracervical hysterectomy with BSO, and debulking of peritoneum.  Following surgery, she remained hypotensive despite 2L IVF.  PCCM was consulted for further recs.  On my initial exam, pt clinically volume depleted.  CVL inserted for CVP's and fluid guidance.  Hgb noted to be 6.1 > 2 units PRBC  ordered.   PAST MEDICAL HISTORY :   has a past medical history of Crohn's disease; Chronic headache; Cancer; and Nausea and vomiting (08/25/2014).  has past surgical history that includes Breast surgery; Uterine fibroid surgery; Colonoscopy w/ biopsies (06/29/2014); Flexible sigmoidoscopy (N/A, 06/30/2014); colonic stent placement (N/A, 06/30/2014); and Portacath placement (N/A, 07/04/2014). Prior to Admission medications   Medication Sig Start Date End Date Taking? Authorizing Provider  acetaminophen (TYLENOL) 325 MG tablet Take 325 mg by mouth every 6 (six) hours as needed for fever or headache (headahce).    Yes Historical Provider, MD  cromolyn (NASALCROM) 5.2 MG/ACT nasal spray Place 1 spray into both nostrils at bedtime.   Yes Historical Provider, MD  Cyanocobalamin (VITAMIN B-12 CR) 1500 MCG TBCR Take 1 tablet by mouth daily.   Yes Historical Provider, MD  dexamethasone (DECADRON) 4 MG tablet Take 1 tablet (4 mg total) by mouth 2 (two) times daily. For 2 days. Begin day of pump disconnect. 08/16/14  Yes Ladell Pier, MD  Fluorouracil (ADRUCIL IV) Inject into the vein every 14 (fourteen) days.   Yes Historical Provider, MD  leucovorin in dextrose 5 % 250 mL Inject into the vein every 14 (fourteen) days.   Yes Historical Provider, MD  lidocaine-prilocaine (EMLA) cream Apply 1 application topically as needed. Apply to portacath site 1-2 hours prior to use 07/14/14  Yes Owens Shark, NP  LORazepam (ATIVAN) 0.5 MG tablet Take 1 tablet (0.5 mg total) by mouth every 6 (six) hours as needed for anxiety. 08/21/14  Yes Ladell Pier, MD  nicotine (NICODERM CQ - DOSED IN MG/24 HOURS) 14 mg/24hr patch Place 14 mg  onto the skin daily.   Yes Historical Provider, MD  omeprazole (PRILOSEC) 40 MG capsule Take 40 mg by mouth daily as needed (nausea and vomiting).   Yes Historical Provider, MD  ondansetron (ZOFRAN ODT) 4 MG disintegrating tablet 4mg  ODT q4 hours prn nausea/vomit 08/24/14  Yes Orpah Greek,  MD  oxaliplatin in dextrose 5 % 500 mL Inject into the vein every 14 (fourteen) days.   Yes Historical Provider, MD  zolpidem (AMBIEN) 5 MG tablet Take 1 tablet (5 mg total) by mouth at bedtime as needed for sleep. 07/14/14  Yes Owens Shark, NP  traMADol (ULTRAM) 50 MG tablet Take 1 tablet (50 mg total) by mouth every 6 (six) hours as needed. 07/23/14   Nat Christen, MD   Allergies  Allergen Reactions  . Avelox [Moxifloxacin Hcl In Nacl] Hives  . Codeine Hives  . Dextromethorphan Hives  . Erythromycin Hives  . Penicillins Hives  . Suprep [Na Sulfate-K Sulfate-Mg Sulf] Nausea And Vomiting    FAMILY HISTORY:  Family History  Problem Relation Age of Onset  . Hypertension Mother   . Cancer Father     SOCIAL HISTORY:  reports that she quit smoking about 2 months ago. Her smoking use included Cigarettes. She has a 20 pack-year smoking history. She has never used smokeless tobacco. She reports that she does not drink alcohol or use illicit drugs.  REVIEW OF SYSTEMS:  All negative; except for those that are bolded, which indicate positives.  Constitutional: weight loss, weight gain, night sweats, fevers, chills, fatigue, weakness.  HEENT: headaches, sore throat, sneezing, nasal congestion, post nasal drip, difficulty swallowing, tooth/dental problems, visual complaints, visual changes, ear aches. Neuro: difficulty with speech, weakness, numbness, ataxia. CV:  chest pain, orthopnea, PND, swelling in lower extremities, dizziness, palpitations, syncope.  Resp: cough, hemoptysis, dyspnea, wheezing. GI  heartburn, indigestion, abdominal pain, nausea, vomiting, diarrhea, constipation, change in bowel habits, loss of appetite, hematemesis, melena, hematochezia.  GU: dysuria, change in color of urine, urgency or frequency, flank pain, hematuria. MSK: joint pain or swelling, decreased range of motion. Psych: change in mood or affect, depression, anxiety, suicidal ideations, homicidal  ideations. Skin: rash, itching, bruising.   SUBJECTIVE:  Not speaking due to sore throat post op.  Shakes head in response to questions.  Denies chest pain, SOB, lightheadedness.  Experiencing abdominal pain post op.  VITAL SIGNS: Temp:  [97.5 F (36.4 C)-98.5 F (36.9 C)] 98.5 F (36.9 C) (06/02 1944) Pulse Rate:  [81-100] 100 (06/02 1944) Resp:  [15-24] 24 (06/02 1944) BP: (70-125)/(41-69) 70/41 mmHg (06/02 1944) SpO2:  [100 %] 100 % (06/02 1944) Weight:  [46.3 kg (102 lb 1.2 oz)] 46.3 kg (102 lb 1.2 oz) (06/02 0425) HEMODYNAMICS:   VENTILATOR SETTINGS:   INTAKE / OUTPUT: Intake/Output      06/02 0701 - 06/03 0700   I.V. (mL/kg) 7300 (157.7)   IV Piggyback 250   TPN    Total Intake(mL/kg) 7550 (163.1)   Urine (mL/kg/hr) 75 (0.1)   Emesis/NG output 0 (0)   Drains 190 (0.3)   Other 3800 (5.6)   Stool 0 (0)   Blood 250 (0.4)   Total Output 4315   Net +3235         PHYSICAL EXAMINATION: General: Thin adult female, in NAD. Neuro: A&O x 3, non-focal. Whispers and shakes head in response to questions. HEENT: [place]/AT. PERRL, sclerae anicteric. Cardiovascular: RRR, no M/R/G.  Lungs: Respirations even and unlabored.  CTA bilaterally, No W/R/R.  Abdomen:  Abd midline incision with dressing C/D/I.  Ostomy in place.  Abd appropriately tender. Musculoskeletal: No gross deformities, no edema.  Skin: Intact, warm, no rashes.  LABS:  CBC  Recent Labs Lab 08/29/14 1000 08/30/14 0600 08/31/14 2022  WBC 3.8* 4.7 14.0*  HGB 8.3* 10.4* 6.1*  HCT 26.6* 33.0* 19.5*  PLT 130* 146* 99*   Coag's  Recent Labs Lab 08/25/14 1734  APTT 30  INR 1.18   BMET  Recent Labs Lab 08/30/14 0600 08/31/14 0600 08/31/14 2022  NA 147* 143 139  K 3.2* 2.9* 3.4*  CL 110 103 108  CO2 22 28 25   BUN 39* 41* 29*  CREATININE 0.63 0.46 0.74  GLUCOSE 98 166* 248*   Electrolytes  Recent Labs Lab 08/25/14 1734  08/28/14 1735  08/30/14 0600 08/31/14 0600 08/31/14 2022  CALCIUM  8.5*  < > 8.6*  < > 9.2 9.4 7.5*  MG 2.1  --  2.2  --  2.2 2.3  --   PHOS 3.2  --   --   --  2.0* 3.0  --   < > = values in this interval not displayed. Sepsis Markers No results for input(s): LATICACIDVEN, PROCALCITON, O2SATVEN in the last 168 hours. ABG No results for input(s): PHART, PCO2ART, PO2ART in the last 168 hours. Liver Enzymes  Recent Labs Lab 08/28/14 1735 08/30/14 0600 08/31/14 0600  AST 22 26 22   ALT 19 16 16   ALKPHOS 75 84 78  BILITOT 0.9 1.0 0.5  ALBUMIN 3.2* 3.5 3.5   Cardiac Enzymes  Recent Labs Lab 08/28/14 1735 08/28/14 2255 08/29/14 0455  TROPONINI <0.03 0.04* <0.03   Glucose  Recent Labs Lab 08/30/14 2048 08/30/14 2320 08/31/14 0413 08/31/14 0747 08/31/14 1540 08/31/14 1942  GLUCAP 148* 150* 167* 162* 230* 221*    Imaging No results found.  ASSESSMENT / PLAN:  CARDIOVASCULAR CVL L IJ 6/2 >>> A:  Hypotension post op - likely due to hypovolemia (dry on exam) + blood loss after major abdominal surgery A.flutter with RVR - resolved P:  Additional 1L NS now. 2u PRBC ordered. Goal MAP > 65. Goal CVP 8 - 12. If no improvement after fluids and blood transfusion, then will start vasopressors.  GASTROINTESTINAL A:   SBO s/p ex lap 08/31/14 with SBR, low anterior rectosigmoid resection (including stent) with colostomy, appendectomy, supracervical hysterectomy with BSO, and debulking of peritoneum GI prophylaxis Nausea Nutrition P:   Post op care per surgery. SUP: Pantoprazole. Metoclopramide. NPO. Timing of PO per surgery. Continue TPN.  PULMONARY A: At risk post op atelectasis Former smoker - just quit 2 months ago P:   Pulmonary hygiene. Mobilize as able once cleared by surgery. Continue nicotine patch.  RENAL A:   Hypokalemia Hypocalcemia P:   NS @ 75. Potassium x 3 runs. 1g Ca gluconate. Check ionized calcium. BMP in AM.  HEMATOLOGIC / ONCOLOGIC A:   Anemia - likely due to acute blood loss intra op (although  op note only indicates 250cc blood loss) Thrombocytopenia VTE Prophylaxis Hx rectal CA with liver mets - followed by Dr. Ammie Dalton P:  2u PRBC now. Monitor platelets. D/c subQ heparin. SCD's. CBC in AM. Follow up with Dr. Benay Spice.  INFECTIOUS A:   Surgical prophylaxis P:   Abx: Imipenem, start date 6/1, day 2/x.  ENDOCRINE A:   Hyperglycemia  P:   SSI.  NEUROLOGIC A:   Pain control P:   Dilaudid PCA.   Family updated: Husband at bedside.  Interdisciplinary Family Meeting  v Palliative Care Meeting:  Due by: 09/06/14.  Pt to remain on [facility] service.  Will administer additional 1L NS bolus and 2u PRBC, then will assess CVP response.  She may need additional fluids as she appears volume depleted on exam.  If she remains hypotensive despite these measures, then will transfer to ICU and PCCM will assume care.   [name], [place] [facility] Pager: [phone]  or [phone] 08/31/2014, 9:36 PM

## 2014-08-31 NOTE — Anesthesia Procedure Notes (Signed)
Procedure Name: Intubation Date/Time: 08/31/2014 9:58 AM Performed by: Lajuana Carry E Pre-anesthesia Checklist: Patient identified, Emergency Drugs available, Suction available and Patient being monitored Patient Re-evaluated:Patient Re-evaluated prior to inductionOxygen Delivery Method: Circle System Utilized Preoxygenation: Pre-oxygenation with 100% oxygen Intubation Type: IV induction, Rapid sequence and Cricoid Pressure applied Laryngoscope Size: Miller and 2 Grade View: Grade I Tube type: Oral Tube size: 6.5 mm Number of attempts: 1 Airway Equipment and Method: Stylet Placement Confirmation: ETT inserted through vocal cords under direct vision,  positive ETCO2 and breath sounds checked- equal and bilateral Secured at: 22 cm Tube secured with: Tape Dental Injury: Teeth and Oropharynx as per pre-operative assessment

## 2014-08-31 NOTE — Progress Notes (Signed)
[facility] PROGRESS NOTE  Sydney Morrison FFM:[id number] DOB: [birth year] DOA: 08/25/2014 PCP: Delphina Cahill, MD   Brief narrative 59 y/o female with hx of rectal ca with liver metastasis, rectal stent placement in 06/2014 (under Dr. Gearldine Shown care), status post 3 cycles of chemotherapy, last cycle completed 08/16/2014 admitted on 5/27 with small bowel obstruction and failed conservative management. taken to OR on 6/2 for exploratory laparotomy with lower anterior rectosigmoid resection with colostomy.    Assessment/Plan: Small bowel obstruction Failed conservative mgmt. OR today with  exploratory laparotomy with lower anterior rectosigmoid resection with colostomy. Also had appendectomy , hysterectomy and salpingoophorectomy, and debulking of peritoneum. hypotensive post op with MAP of 60. 2L  LR bolus ordered continue maintenance fluid. Pain control prn NPO for now. continue NG tube. Possibly remove in am and start clears.   SIRS on admission Afebrile . D/c flagyl. On empiric Primaxin. CT abd without abscess or perforation.   Rectal ca with liver mets  sees Dr Learta Codding. S/p 3 cycles of chemo  Chest pain and Aflutter with RVR  on 5/30 Cards consulted. 2D echo with normal EF. replenished low k  DIET: NPO  DVT prophylaxis   Code Status: full code Family Communication: famiyl at bedside Disposition Plan: stepdown monitoring for now   Consultants:  CCS  Procedures:  OR on 6/2  Antibiotics:  primaxin   HPI/Subjective: Seen and examined after returning from OR. Fatigued.  Objective: Filed Vitals:   08/31/14 1613  BP:   Pulse:   Temp: 97.5 F (36.4 C)  Resp:     Intake/Output Summary (Last 24 hours) at 08/31/14 1619 Last data filed at 08/31/14 1500  Gross per 24 hour  Intake 9482.16 ml  Output   4315 ml  Net 5167.16 ml   Filed Weights   08/28/14 0358 08/29/14 0517 08/31/14 0425  Weight: 44.3 kg (97 lb 10.6 oz) 44.1 kg (97 lb 3.6 oz) 46.3 kg (102 lb  1.2 oz)    Exam:   General:  Middle aged thin built female fatigued after returning from OR  HEENT: dry mucosa, pallor+, supple neck, NG tube+  Cardiovascular: NS1&S2, no murmurs  Respiratory: clear b/l  Abdomen: laparotomy dressing, colostomy and abd drain  Ext: warm, no edema   CNS: alert and oriented    Data Reviewed: Basic Metabolic Panel:  Recent Labs Lab 08/25/14 1734  08/28/14 1020 08/28/14 1735 08/29/14 1000 08/30/14 0600 08/31/14 0600  NA 136  < > 145 144 147* 147* 143  K 4.1  < > 3.1* 2.8* 3.0* 3.2* 2.9*  CL 99*  < > 108 110 114* 110 103  CO2 27  < > 22 20* 21* 22 28  GLUCOSE 104*  < > 103* 105* 98 98 166*  BUN 29*  < > 50* 47* 42* 39* 41*  CREATININE 0.61  < > 0.64 0.61 0.51 0.63 0.46  CALCIUM 8.5*  < > 8.9 8.6* 8.8* 9.2 9.4  MG 2.1  --   --  2.2  --  2.2 2.3  PHOS 3.2  --   --   --   --  2.0* 3.0  < > = values in this interval not displayed. Liver Function Tests:  Recent Labs Lab 08/25/14 1734 08/28/14 1735 08/30/14 0600 08/31/14 0600  AST 20 22 26 22   ALT 14 19 16 16   ALKPHOS 99 75 84 78  BILITOT 0.6 0.9 1.0 0.5  PROT 6.6 6.6 6.8 7.0  ALBUMIN 3.5 3.2* 3.5 3.5  No results for input(s): LIPASE, AMYLASE in the last 168 hours. No results for input(s): AMMONIA in the last 168 hours. CBC:  Recent Labs Lab 08/25/14 1734 08/27/14 1020 08/29/14 1000 08/30/14 0600  WBC 5.4 2.3* 3.8* 4.7  NEUTROABS 3.6  --   --  1.3*  HGB 8.5* 9.2* 8.3* 10.4*  HCT 26.2* 29.3* 26.6* 33.0*  MCV 83.4 84.4 85.5 83.3  PLT 117* 124* 130* 146*   Cardiac Enzymes:  Recent Labs Lab 08/28/14 1735 08/28/14 2255 08/29/14 0455  TROPONINI <0.03 0.04* <0.03   BNP (last 3 results) No results for input(s): BNP in the last 8760 hours.  ProBNP (last 3 results) No results for input(s): PROBNP in the last 8760 hours.  CBG:  Recent Labs Lab 08/30/14 2048 08/30/14 2320 08/31/14 0413 08/31/14 0747 08/31/14 1540  GLUCAP 148* 150* 167* 162* 230*    Recent  Results (from the past 240 hour(s))  Culture, blood (routine x 2)     Status: None (Preliminary result)   Collection Time: 08/26/14  7:05 AM  Result Value Ref Range Status   Specimen Description BLOOD RIGHT ARM  Final   Special Requests   Final    BOTTLES DRAWN AEROBIC AND ANAEROBIC 10CC BOTH BOTTLES   Culture   Final           BLOOD CULTURE RECEIVED NO GROWTH TO DATE CULTURE WILL BE HELD FOR 5 DAYS BEFORE ISSUING A FINAL NEGATIVE REPORT Performed at [facility]    Report Status PENDING  Incomplete  Culture, blood (routine x 2)     Status: None (Preliminary result)   Collection Time: 08/26/14  7:10 AM  Result Value Ref Range Status   Specimen Description BLOOD LEFT ARM  Final   Special Requests   Final    BOTTLES DRAWN AEROBIC AND ANAEROBIC 10CC BOTH BOTTLES   Culture   Final           BLOOD CULTURE RECEIVED NO GROWTH TO DATE CULTURE WILL BE HELD FOR 5 DAYS BEFORE ISSUING A FINAL NEGATIVE REPORT Performed at [facility]    Report Status PENDING  Incomplete  Surgical pcr screen     Status: None   Collection Time: 08/30/14 10:08 PM  Result Value Ref Range Status   MRSA, PCR NEGATIVE NEGATIVE Final   Staphylococcus aureus NEGATIVE NEGATIVE Final    Comment:        The Xpert SA Assay (FDA approved for NASAL specimens in patients over 13 years of age), is one component of a comprehensive surveillance program.  Test performance has been validated by [facility] for patients greater than or equal to 13 year old. It is not intended to diagnose infection nor to guide or monitor treatment.      Studies: Dg Abd Acute W/chest  08/30/2014   CLINICAL DATA:  Follow-up small bowel obstruction. Intermittent abdominal pain with nausea and vomiting.  EXAM: DG ABDOMEN ACUTE W/ 1V CHEST  COMPARISON:  08/29/2014  FINDINGS: Large stool burden throughout the colon. Continued mildly prominent mid abdominal small bowel loops with scattered air-fluid levels. No free air  organomegaly. Presumed colonic stent in the pelvis.  Left Port-A-Cath remains in place, unchanged. Heart is normal size. Mild hyperinflation of the lungs without focal opacity or effusion.  IMPRESSION: Continued mild prominence of mid abdominal small bowel loops with scattered air-fluid levels. Large stool burden throughout the colon. No real change since prior study.   Electronically Signed   By: Rolm Baptise M.D.  On: 08/30/2014 10:34    Scheduled Meds: . antiseptic oral rinse  7 mL Mouth Rinse BID  . aspirin  81 mg Oral Daily  . heparin  5,000 Units Subcutaneous Once  . HYDROmorphone      . imipenem-cilastatin  250 mg Intravenous 3 times per day  . insulin aspart  0-9 Units Subcutaneous 6 times per day  . lactated ringers  1,000 mL Intravenous Once  . nicotine  21 mg Transdermal Daily  . pantoprazole (PROTONIX) IV  40 mg Intravenous QHS  . scopolamine  1 patch Transdermal Q72H  . sodium chloride  10-40 mL Intracatheter Q12H  . sodium chloride  3 mL Intravenous Q12H   Continuous Infusions: . Marland KitchenTPN (CLINIMIX-E) Adult     And  . fat emulsion    . sodium chloride Stopped (08/31/14 0920)  . sodium chloride    . bupivacaine 0.25 % ON-Q pump DUAL CATH 300 mL    . Marland KitchenTPN (CLINIMIX-E) Adult 40 mL/hr (08/31/14 0920)   And  . 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

## 2014-08-31 NOTE — Procedures (Signed)
Central Venous Catheter Insertion Procedure Note Sydney Morrison [id number] [birth year]  Procedure: Insertion of Central Venous Catheter Indications: Assessment of intravascular volume, Drug and/or fluid administration and Frequent blood sampling  Procedure Details Consent: Risks of procedure as well as the alternatives and risks of each were explained to the (patient/caregiver).  Consent for procedure obtained. Time Out: Verified patient identification, verified procedure, site/side was marked, verified correct patient position, special equipment/implants available, medications/allergies/relevent history reviewed, required imaging and test results available.  Performed  Maximum sterile technique was used including antiseptics, cap, gloves, gown, hand hygiene, mask and sheet. Skin prep: Chlorhexidine; local anesthetic administered A antimicrobial bonded/coated triple lumen catheter was placed in the left internal jugular vein using the Seldinger technique.  Evaluation Blood flow good Complications: No apparent complications Patient did tolerate procedure well. Chest X-ray ordered to verify placement.  CXR: pending.  Procedure performed under direct ultrasound guidance for real time vessel cannulation.      Sydney Morrison, Sydney Morrison [facility] Pager: [phone]  or [phone] 08/31/2014, 9:29 PM

## 2014-08-31 NOTE — Progress Notes (Signed)
  Patient not seen. Taken to OR for refractory SBO. Telemetry reviewed. No further atrial flutter or tachyarrhythmias noted. Continue on telemetry post-op. Use PRN IV metoprolol if recurrent tachy atrial arrhthymias. Keep K and Mg WNL.   Arcenio Mullaly 08/31/2014

## 2014-08-31 NOTE — Progress Notes (Signed)
[address] [place], 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  High likelihood of needing SB resection vs bypass if associated with carcinomatosis.  Low threshold to perform diverting colostomy (palliative LAR resection if mobile & not w dense pelvic adhesions) vs end colostomy since rectal stent may be migrating & still w constipation concerning for partial colon obstruction as well.  Pt wants to be aggressive.  With met. disease improved/stable, will try to palliate aggressively / reasonably as possible.  Low threshold for G tube if carcinomatosis confirmed  -The anatomy & physiology of the digestive tract was discussed.  The pathophysiology of perforation was discussed.  Differential diagnosis such as perforated ulcer or colon, etc was discussed.   Natural history risks without surgery such as death was discussed.  I recommended abdominal exploration to diagnose & treat the source of the problem.  Laparoscopic & open techniques were discussed.   Risks such as bleeding, infection, abscess, leak, reoperation, bowel resection, possible ostomy, hernia, heart attack, death, and other risks were discussed.   The risks of no intervention will lead to serious problems including death.   I expressed a good likelihood that surgery will address the problem.    Goals of post-operative recovery were discussed as well.  We will work to minimize complications although risks in an emergent setting  are high.   Questions were answered.  The patient & her husband expressed understanding & wishes to proceed with surgery.      -TNA OK given persistent SBO (I never said otherwise).  Not great indefinite plan but reasonable over short term until obstruction treated/resolved  -pt tends to dictate care but willing to take (most of) surgical advice.    -palliate nausea per pt/oncology/primary team.  Consider scopolamine - pt willing to try periop.  -VTE prophylaxis- SCDs, etc  -mobilize as tolerated to help recovery  Adin Hector, M.D., F.A.C.S. Gastrointestinal and Minimally Invasive Surgery [facility] [address], [place], [place] [address] [phone] Main / Paging   08/31/2014  Subjective:  Nauseated - Ativan works best for her - refused other meds Sore Family at bedside    Objective:  Vital signs:  Filed Vitals:   08/30/14 0628 08/30/14 1503 08/30/14 2053 08/31/14 0425  BP: 124/63 136/83 128/70 125/69  Pulse: 76 74 87 82  Temp: 97.6 F (36.4 C) 98.3 F (36.8 C) 98 F (36.7 C) 97.9 F (36.6 C)  TempSrc: Oral Oral Oral Oral  Resp: 18 18 20 20   Height:      Weight:    46.3 kg (102 lb 1.2 oz)  SpO2: 99% 100% 99% 100%    Last BM Date: 08/29/14  Intake/Output   Yesterday:  06/01 0701 - 06/02 0700 In: 3628.7 [I.V.:2294.5; IV Piggyback:755; TPN:579.2] Out: -  This shift:     Bowel function:  Flatus: min  BM: no  Drain: n/a  Physical Exam:  General: Pt awake/alert/oriented x4 in no  acute distress.  Cachectic Eyes: PERRL, normal EOM.  Sclera clear.  No icterus Neuro: CN II-XII intact w/o focal sensory/motor deficits. Lymph: No head/neck/groin lymphadenopathy Psych:  No delerium/psychosis/paranoia.  Mildly groggy HENT: Normocephalic, Mucus membranes moist.  No thrush Neck: Supple, No tracheal deviation Chest: No chest wall pain w good excursion CV:  Pulses intact.  Regular rhythm MS: Normal AROM mjr joints.  No  obvious deformity Abdomen: Soft.  Moderately distended.  Firm RLQ abd wall mass.  No evidence of peritonitis.  No incarcerated hernias. Ext:  SCDs BLE.  No mjr edema.  No cyanosis Skin: No petechiae / purpura  Results:   Labs: Results for orders placed or performed during the hospital encounter of 08/25/14 (from the past 48 hour(s))  Comprehensive metabolic panel     Status: Abnormal   Collection Time: 08/30/14  6:00 AM  Result Value Ref Range   Sodium 147 (H) 135 - 145 mmol/L   Potassium 3.2 (L) 3.5 - 5.1 mmol/L   Chloride 110 101 - 111 mmol/L   CO2 22 22 - 32 mmol/L   Glucose, Bld 98 65 - 99 mg/dL   BUN 39 (H) 6 - 20 mg/dL   Creatinine, Ser 0.63 0.44 - 1.00 mg/dL   Calcium 9.2 8.9 - 10.3 mg/dL   Total Protein 6.8 6.5 - 8.1 g/dL   Albumin 3.5 3.5 - 5.0 g/dL   AST 26 15 - 41 U/L   ALT 16 14 - 54 U/L   Alkaline Phosphatase 84 38 - 126 U/L   Total Bilirubin 1.0 0.3 - 1.2 mg/dL   GFR calc non Af Amer >60 >60 mL/min   GFR calc Af Amer >60 >60 mL/min    Comment: (NOTE) The eGFR has been calculated using the CKD EPI equation. This calculation has not been validated in all clinical situations. eGFR's persistently <60 mL/min signify possible Chronic Kidney Disease.    Anion gap 15 5 - 15  Prealbumin     Status: Abnormal   Collection Time: 08/30/14  6:00 AM  Result Value Ref Range   Prealbumin 13.2 (L) 18 - 38 mg/dL    Comment: Performed at [facility]  Magnesium     Status: None   Collection Time: 08/30/14  6:00 AM  Result Value Ref Range   Magnesium 2.2 1.7 - 2.4 mg/dL  Phosphorus     Status: Abnormal   Collection Time: 08/30/14  6:00 AM  Result Value Ref Range   Phosphorus 2.0 (L) 2.5 - 4.6 mg/dL  Triglycerides     Status: Abnormal   Collection Time: 08/30/14  6:00 AM  Result Value Ref Range   Triglycerides 226 (H) <150 mg/dL    Comment: Performed at [facility]  CBC     Status: Abnormal   Collection Time: 08/30/14  6:00 AM  Result Value Ref Range    WBC 4.7 4.0 - 10.5 K/uL   RBC 3.96 3.87 - 5.11 MIL/uL   Hemoglobin 10.4 (L) 12.0 - 15.0 g/dL    Comment: REPEATED TO VERIFY DELTA CHECK NOTED    HCT 33.0 (L) 36.0 - 46.0 %   MCV 83.3 78.0 - 100.0 fL   MCH 26.3 26.0 - 34.0 pg   MCHC 31.5 30.0 - 36.0 g/dL   RDW 18.5 (H) 11.5 - 15.5 %   Platelets 146 (L) 150 - 400 K/uL  Differential     Status: Abnormal   Collection Time: 08/30/14  6:00 AM  Result Value Ref Range  Neutrophils Relative % 28 (L) 43 - 77 %   Neutro Abs 1.3 (L) 1.7 - 7.7 K/uL   Lymphocytes Relative 35 12 - 46 %   Lymphs Abs 1.7 0.7 - 4.0 K/uL   Monocytes Relative 36 (H) 3 - 12 %   Monocytes Absolute 1.7 (H) 0.1 - 1.0 K/uL   Eosinophils Relative 1 0 - 5 %   Eosinophils Absolute 0.0 0.0 - 0.7 K/uL   Basophils Relative 1 0 - 1 %   Basophils Absolute 0.0 0.0 - 0.1 K/uL  Hemoglobin A1c     Status: Abnormal   Collection Time: 08/30/14  6:00 AM  Result Value Ref Range   Hgb A1c MFr Bld 5.9 (H) 4.8 - 5.6 %    Comment: (NOTE)         Pre-diabetes: 5.7 - 6.4         Diabetes: >6.4         Glycemic control for adults with diabetes: <7.0    Mean Plasma Glucose 123 mg/dL    Comment: (NOTE) Performed At: [facility] [place], [place] [id number] Lindon Romp MD KV:[id number]   Glucose, capillary     Status: Abnormal   Collection Time: 08/30/14  7:37 AM  Result Value Ref Range   Glucose-Capillary 100 (H) 65 - 99 mg/dL  Glucose, capillary     Status: Abnormal   Collection Time: 08/30/14  8:48 PM  Result Value Ref Range   Glucose-Capillary 148 (H) 65 - 99 mg/dL  Surgical pcr screen     Status: None   Collection Time: 08/30/14 10:08 PM  Result Value Ref Range   MRSA, PCR NEGATIVE NEGATIVE   Staphylococcus aureus NEGATIVE NEGATIVE    Comment:        The Xpert SA Assay (FDA approved for NASAL specimens in patients over 8 years of age), is one component of a comprehensive surveillance program.  Test performance has been validated by  [facility] for patients greater than or equal to 40 year old. It is not intended to diagnose infection nor to guide or monitor treatment.   Glucose, capillary     Status: Abnormal   Collection Time: 08/30/14 11:20 PM  Result Value Ref Range   Glucose-Capillary 150 (H) 65 - 99 mg/dL  Glucose, capillary     Status: Abnormal   Collection Time: 08/31/14  4:13 AM  Result Value Ref Range   Glucose-Capillary 167 (H) 65 - 99 mg/dL  Comprehensive metabolic panel     Status: Abnormal   Collection Time: 08/31/14  6:00 AM  Result Value Ref Range   Sodium 143 135 - 145 mmol/L   Potassium 2.9 (L) 3.5 - 5.1 mmol/L   Chloride 103 101 - 111 mmol/L   CO2 28 22 - 32 mmol/L   Glucose, Bld 166 (H) 65 - 99 mg/dL   BUN 41 (H) 6 - 20 mg/dL   Creatinine, Ser 0.46 0.44 - 1.00 mg/dL   Calcium 9.4 8.9 - 10.3 mg/dL   Total Protein 7.0 6.5 - 8.1 g/dL   Albumin 3.5 3.5 - 5.0 g/dL   AST 22 15 - 41 U/L   ALT 16 14 - 54 U/L   Alkaline Phosphatase 78 38 - 126 U/L   Total Bilirubin 0.5 0.3 - 1.2 mg/dL   GFR calc non Af Amer >60 >60 mL/min   GFR calc Af Amer >60 >60 mL/min    Comment: (NOTE) The eGFR has been calculated using the CKD  EPI equation. This calculation has not been validated in all clinical situations. eGFR's persistently <60 mL/min signify possible Chronic Kidney Disease.    Anion gap 12 5 - 15  Magnesium     Status: None   Collection Time: 08/31/14  6:00 AM  Result Value Ref Range   Magnesium 2.3 1.7 - 2.4 mg/dL  Phosphorus     Status: None   Collection Time: 08/31/14  6:00 AM  Result Value Ref Range   Phosphorus 3.0 2.5 - 4.6 mg/dL  Glucose, capillary     Status: Abnormal   Collection Time: 08/31/14  7:47 AM  Result Value Ref Range   Glucose-Capillary 162 (H) 65 - 99 mg/dL    Imaging / Studies: Dg Abd 2 Views  08/29/2014   CLINICAL DATA:  Recent removal nasogastric tube for small bowel obstruction. Persistent nausea and vomiting.  EXAM: ABDOMEN - 2 VIEW  COMPARISON:  Abdominal  radiographs 08/28/2014 and 08/26/2014. Acute abdominal series 08/24/2014.  FINDINGS: Prior Port-A-Cath projects over the left chest, with the distal tip projecting over the distal superior vena cava. The lung bases are clear. Heart size is normal.  There are scattered air-fluid levels within small bowel and colon on the upright view. No evidence of pneumoperitoneum. Air-fluid level is noted in the gastric fundus.  Gaseous distention of a central small bowel loop appears decreased compared to the radiographs of 08/28/2014. Small bowel now measures 2.5 cm (previously 3.8 cm in this region). Gas is seen within a bowel loop in the right lower quadrant, projecting over the right iliac bone and right sacrum; it is difficult to determine if this is dilated small bowel or colon. There is a moderate amount of stool within the colon; overall, colonic stool burden has decreased since 08/26/2014. A distal colonic stent is again noted.  IMPRESSION: Further improvement in small bowel distention since radiographs dated 08/28/2014.  Colonic stent present.  Decreasing stool burden.   Electronically Signed   By: Curlene Dolphin M.D.   On: 08/29/2014 11:27   Dg Abd Acute W/chest  08/30/2014   CLINICAL DATA:  Follow-up small bowel obstruction. Intermittent abdominal pain with nausea and vomiting.  EXAM: DG ABDOMEN ACUTE W/ 1V CHEST  COMPARISON:  08/29/2014  FINDINGS: Large stool burden throughout the colon. Continued mildly prominent mid abdominal small bowel loops with scattered air-fluid levels. No free air organomegaly. Presumed colonic stent in the pelvis.  Left Port-A-Cath remains in place, unchanged. Heart is normal size. Mild hyperinflation of the lungs without focal opacity or effusion.  IMPRESSION: Continued mild prominence of mid abdominal small bowel loops with scattered air-fluid levels. Large stool burden throughout the colon. No real change since prior study.   Electronically Signed   By: Rolm Baptise M.D.   On:  08/30/2014 10:34    Medications / Allergies: per chart  Antibiotics: Anti-infectives    Start     Dose/Rate Route Frequency Ordered Stop   08/31/14 0600  clindamycin (CLEOCIN) IVPB 900 mg     900 mg 100 mL/hr over 30 Minutes Intravenous 60 min pre-op 08/30/14 1218 08/31/14 0944   08/31/14 0600  gentamicin (GARAMYCIN) 220 mg in dextrose 5 % 100 mL IVPB     5 mg/kg  44.1 kg 105.5 mL/hr over 60 Minutes Intravenous 60 min pre-op 08/30/14 1218     08/30/14 2100  imipenem-cilastatin (PRIMAXIN) 250 mg in sodium chloride 0.9 % 100 mL IVPB     250 mg 200 mL/hr over 30 Minutes Intravenous 3 times  per day 08/30/14 1739     08/25/14 1800  imipenem-cilastatin (PRIMAXIN) 250 mg in sodium chloride 0.9 % 100 mL IVPB  Status:  Discontinued     250 mg 200 mL/hr over 30 Minutes Intravenous Every 6 hours 08/25/14 1716 08/30/14 1739   08/25/14 1800  metroNIDAZOLE (FLAGYL) IVPB 500 mg  Status:  Discontinued     500 mg 100 mL/hr over 60 Minutes Intravenous Every 8 hours 08/25/14 1716 08/27/14 1003        Note: Portions of this report may have been transcribed using voice recognition software. Every effort was made to ensure accuracy; however, inadvertent computerized transcription errors may be present.   Any transcriptional errors that result from this process are unintentional.     Adin Hector, M.D., F.A.C.S. Gastrointestinal and Minimally Invasive Surgery [facility] [address], [place] [place], [place] [address] [phone] Main / Paging   08/31/2014  CARE TEAM:  PCP: Delphina Cahill, MD  Outpatient Care Team: Patient Care Team: Delphina Cahill, MD as PCP - General (Internal Medicine)  Inpatient Treatment Team: Treatment Team: Attending Provider: Louellen Molder, MD; Attending Physician: Robbie Lis, MD; Technician: Kirstie Peri, NT; Rounding Team: Garner Gavel, MD; Consulting Physician: Ladell Pier, MD; Consulting Physician: Nolon Nations, MD; Technician:  Emmaline Kluver, NT; Registered Nurse: Richrd Humbles, RN; Technician: Milagros Reap, NT; Technician: Mertha Baars, NT; Consulting Physician: Michae Kava [facility], MD

## 2014-08-31 NOTE — Progress Notes (Signed)
PARENTERAL NUTRITION CONSULT NOTE - Follow-up  Pharmacy Consult for TPN Indication: Persistent N/V, possible SBO  Allergies  Allergen Reactions  . Avelox [Moxifloxacin Hcl In Nacl] Hives  . Codeine Hives  . Dextromethorphan Hives  . Erythromycin Hives  . Penicillins Hives  . Suprep [Na Sulfate-K Sulfate-Mg Sulf] Nausea And Vomiting    Patient Measurements: Height: 5\' 7"  (170.2 cm) Weight: 102 lb 1.2 oz (46.3 kg) IBW/kg (Calculated) : 61.6 Adjusted Body Weight: 44.1 kg Usual Weight: 50.4 kg  Vital Signs: Temp: 97.9 F (36.6 C) (06/02 0425) Temp Source: Oral (06/02 0425) BP: 125/69 mmHg (06/02 0425) Pulse Rate: 82 (06/02 0425) Intake/Output from previous day: 06/01 0701 - 06/02 0700 In: 3628.7 [I.V.:2294.5; IV Piggyback:755; TPN:579.2] Out: -  Intake/Output from this shift:    Labs:  Recent Labs  08/29/14 1000 08/30/14 0600  WBC 3.8* 4.7  HGB 8.3* 10.4*  HCT 26.6* 33.0*  PLT 130* 146*     Recent Labs  08/28/14 1735 08/29/14 1000 08/30/14 0600 08/31/14 0600  NA 144 147* 147* 143  K 2.8* 3.0* 3.2* 2.9*  CL 110 114* 110 103  CO2 20* 21* 22 28  GLUCOSE 105* 98 98 166*  BUN 47* 42* 39* 41*  CREATININE 0.61 0.51 0.63 0.46  CALCIUM 8.6* 8.8* 9.2 9.4  MG 2.2  --  2.2 2.3  PHOS  --   --  2.0* 3.0  PROT 6.6  --  6.8 7.0  ALBUMIN 3.2*  --  3.5 3.5  AST 22  --  26 22  ALT 19  --  16 16  ALKPHOS 75  --  84 78  BILITOT 0.9  --  1.0 0.5  PREALBUMIN  --   --  13.2*  --   TRIG  --   --  226*  --    Estimated Creatinine Clearance: 56.7 mL/min (by C-G formula based on Cr of 0.46).    Recent Labs  08/30/14 2320 08/31/14 0413 08/31/14 0747  GLUCAP 150* 167* 162*    Medical History: Past Medical History  Diagnosis Date  . Crohn's disease   . Chronic headache   . Cancer   . Nausea and vomiting 08/25/2014    Medications:  Scheduled:  . antiseptic oral rinse  7 mL Mouth Rinse BID  . aspirin  81 mg Oral Daily  . clindamycin (CLEOCIN) IV  900 mg  Intravenous 60 min Pre-Op   And  . gentamicin  5 mg/kg Intravenous 60 min Pre-Op  . heparin  5,000 Units Subcutaneous Once  . imipenem-cilastatin  250 mg Intravenous 3 times per day  . insulin aspart  0-9 Units Subcutaneous 6 times per day  . nicotine  21 mg Transdermal Daily  . pantoprazole (PROTONIX) IV  40 mg Intravenous QHS  . potassium chloride  10 mEq Intravenous Q1 Hr x 4  . scopolamine  1 patch Transdermal Q72H  . sodium chloride  10-40 mL Intracatheter Q12H  . sodium chloride  3 mL Intravenous Q12H   Insulin Requirements: 6 units since start of TPN on 6/1 (on sensitive SSI q4h)  Current Nutrition: clear liquids, minimal intake  IVF: NS at 60 ml/hr  Central access: CVC port L chest TPN start date: 6/1  ASSESSMENT  HPI: 31yoF with PMH metastatic rectal cancer and rectal stent 06/2014, s/p 3 cycles chemo presents 5/27 with N/V x 1 day.  Found to have SBO, likely secondary to pelvic tumor on CT.  NG tube placed and later removed once SBO improved on Abd XR.  Started on CL diet, but persistent N/V prevent meaningful PO intake.  To start on TPN per pharmacy for nutritional supplementation until tolerating PO  Significant events:  5/31: patient misunderstood Surgeon to imply that TPN was not going to start today and did not allow TPN to be initiated 6/2: for expl laparotomy  Today:    Glucose (goal <150)- CBGs 150-166, no Hx DM  Electrolytes - K 2.9, CoCa and other lytes  wnl  Renal - SCr low; CrCl 54 ml/min   LFTs - wnl  TGs - elevated at baseline with 226  Prealbumin - low at 13.2 (6/1)  NUTRITIONAL GOALS                                                                                             RD recs: Nutrition needs: 1600-1800 kcal, 70-80 grams protein. Recommend: Clinimix E 5/20 @ 60 mL/hr with 20% lipids @ 10 mL/hr to provide 72 grams protein, 1747  kcal  PLAN                                                                                                                         1) potassium chloride 10 mEq IV x4 run today per MD 2) At 1800 today:  Increase Clinimix E 5/20 to 60  ml/hr.  20% fat emulsion at 10 ml/hr.  Plan to advance as tolerated to the goal rate.  TPN to contain standard multivitamins and trace elements.  Reduce IVF to 40 ml/hr.  Continue Sensitive SSI q4h   TPN lab panels on Mondays & Thursdays.  F/u triglycerides  Dia Sitter, PharmD, BCPS 08/31/2014 8:42 AM

## 2014-08-31 NOTE — Anesthesia Preprocedure Evaluation (Addendum)
Anesthesia Evaluation  Patient identified by MRN, date of birth, ID band Patient awake    Reviewed: Allergy & Precautions, NPO status , Patient's Chart, lab work & pertinent test results  Airway Mallampati: II  TM Distance: >3 FB Neck ROM: Full    Dental no notable dental hx.    Pulmonary neg pulmonary ROS, former smoker,  breath sounds clear to auscultation  Pulmonary exam normal       Cardiovascular negative cardio ROS Normal cardiovascular exam+ dysrhythmias Atrial Fibrillation Rhythm:Regular Rate:Normal     Neuro/Psych  Headaches, negative neurological ROS  negative psych ROS   GI/Hepatic negative GI ROS, Neg liver ROS,   Endo/Other  negative endocrine ROS  Renal/GU negative Renal ROS  negative genitourinary   Musculoskeletal negative musculoskeletal ROS (+)   Abdominal   Peds negative pediatric ROS (+)  Hematology negative hematology ROS (+) anemia ,   Anesthesia Other Findings   Reproductive/Obstetrics negative OB ROS                           Anesthesia Physical Anesthesia Plan  ASA: III  Anesthesia Plan: General   Post-op Pain Management:    Induction: Intravenous, Rapid sequence and Cricoid pressure planned  Airway Management Planned: Oral ETT  Additional Equipment:   Intra-op Plan:   Post-operative Plan: Extubation in OR and Possible Post-op intubation/ventilation  Informed Consent: I have reviewed the patients History and Physical, chart, labs and discussed the procedure including the risks, benefits and alternatives for the proposed anesthesia with the patient or authorized representative who has indicated his/her understanding and acceptance.   Dental advisory given  Plan Discussed with: CRNA  Anesthesia Plan Comments:        Anesthesia Quick Evaluation

## 2014-09-01 ENCOUNTER — Encounter: Payer: Self-pay | Admitting: *Deleted

## 2014-09-01 ENCOUNTER — Encounter (HOSPITAL_COMMUNITY): Payer: Self-pay | Admitting: Surgery

## 2014-09-01 DIAGNOSIS — I959 Hypotension, unspecified: Secondary | ICD-10-CM | POA: Diagnosis present

## 2014-09-01 LAB — CBC
HCT: 26.7 % — ABNORMAL LOW (ref 36.0–46.0)
Hemoglobin: 9 g/dL — ABNORMAL LOW (ref 12.0–15.0)
MCH: 28.2 pg (ref 26.0–34.0)
MCHC: 33.7 g/dL (ref 30.0–36.0)
MCV: 83.7 fL (ref 78.0–100.0)
PLATELETS: 91 10*3/uL — AB (ref 150–400)
RBC: 3.19 MIL/uL — ABNORMAL LOW (ref 3.87–5.11)
RDW: 16.5 % — ABNORMAL HIGH (ref 11.5–15.5)
WBC: 17.8 10*3/uL — AB (ref 4.0–10.5)

## 2014-09-01 LAB — GLUCOSE, CAPILLARY
GLUCOSE-CAPILLARY: 146 mg/dL — AB (ref 65–99)
GLUCOSE-CAPILLARY: 124 mg/dL — AB (ref 65–99)
GLUCOSE-CAPILLARY: 125 mg/dL — AB (ref 65–99)
Glucose-Capillary: 140 mg/dL — ABNORMAL HIGH (ref 65–99)
Glucose-Capillary: 168 mg/dL — ABNORMAL HIGH (ref 65–99)
Glucose-Capillary: 186 mg/dL — ABNORMAL HIGH (ref 65–99)

## 2014-09-01 LAB — CULTURE, BLOOD (ROUTINE X 2)
CULTURE: NO GROWTH
Culture: NO GROWTH

## 2014-09-01 LAB — BASIC METABOLIC PANEL
Anion gap: 5 (ref 5–15)
BUN: 26 mg/dL — ABNORMAL HIGH (ref 6–20)
CO2: 28 mmol/L (ref 22–32)
CREATININE: 0.51 mg/dL (ref 0.44–1.00)
Calcium: 8 mg/dL — ABNORMAL LOW (ref 8.9–10.3)
Chloride: 108 mmol/L (ref 101–111)
Glucose, Bld: 180 mg/dL — ABNORMAL HIGH (ref 65–99)
POTASSIUM: 3.7 mmol/L (ref 3.5–5.1)
SODIUM: 141 mmol/L (ref 135–145)

## 2014-09-01 LAB — MAGNESIUM: MAGNESIUM: 1.4 mg/dL — AB (ref 1.7–2.4)

## 2014-09-01 LAB — PHOSPHORUS: Phosphorus: 2.7 mg/dL (ref 2.5–4.6)

## 2014-09-01 MED ORDER — INSULIN ASPART 100 UNIT/ML ~~LOC~~ SOLN
0.0000 [IU] | SUBCUTANEOUS | Status: DC
  Administered 2014-09-01 (×3): 2 [IU] via SUBCUTANEOUS
  Administered 2014-09-01: 3 [IU] via SUBCUTANEOUS
  Administered 2014-09-02 – 2014-09-03 (×6): 2 [IU] via SUBCUTANEOUS
  Administered 2014-09-03: 3 [IU] via SUBCUTANEOUS
  Administered 2014-09-03 – 2014-09-06 (×12): 2 [IU] via SUBCUTANEOUS
  Administered 2014-09-06: 3 [IU] via SUBCUTANEOUS
  Administered 2014-09-06 – 2014-09-08 (×6): 2 [IU] via SUBCUTANEOUS

## 2014-09-01 MED ORDER — SODIUM CHLORIDE 0.9 % IJ SOLN: 9.0000 mL | INTRAMUSCULAR | Status: DC | PRN

## 2014-09-01 MED ORDER — LORAZEPAM 2 MG/ML IJ SOLN
0.5000 mg | INTRAMUSCULAR | Status: DC | PRN
  Administered 2014-09-13 – 2014-09-14 (×2): 1 mg via INTRAVENOUS
  Filled 2014-09-01 (×2): qty 1

## 2014-09-01 MED ORDER — HYDROMORPHONE 0.3 MG/ML IV SOLN
INTRAVENOUS | Status: DC
  Administered 2014-09-01: 0.2 mg via INTRAVENOUS
  Administered 2014-09-01: 0.9 mg via INTRAVENOUS
  Administered 2014-09-02: 0 mg via INTRAVENOUS
  Administered 2014-09-02: 0.7 mg via INTRAVENOUS
  Administered 2014-09-02: 0.6 mg via INTRAVENOUS
  Administered 2014-09-02: 0.9 mg via INTRAVENOUS
  Administered 2014-09-03: 1.2 mg via INTRAVENOUS
  Administered 2014-09-03: 0.3 mg via INTRAVENOUS
  Administered 2014-09-03: 09:00:00 via INTRAVENOUS
  Administered 2014-09-03: 2.4 mg via INTRAVENOUS
  Administered 2014-09-03: 1.2 mg via INTRAVENOUS
  Administered 2014-09-03: 0.9 mg via INTRAVENOUS
  Administered 2014-09-03: 0.3 mg via INTRAVENOUS
  Administered 2014-09-04: 0.6 mg via INTRAVENOUS
  Administered 2014-09-04: 0.9 mg via INTRAVENOUS
  Administered 2014-09-04: 10:00:00 via INTRAVENOUS
  Administered 2014-09-04: 0.89 mg via INTRAVENOUS
  Administered 2014-09-04: 1.5 mg via INTRAVENOUS
  Administered 2014-09-05: 0.9 mg via INTRAVENOUS
  Administered 2014-09-05: 1.2 mg via INTRAVENOUS
  Administered 2014-09-05: 1.6 mg via INTRAVENOUS
  Administered 2014-09-05: 1.2 mg via INTRAVENOUS
  Administered 2014-09-06: 0.9 mg via INTRAVENOUS
  Administered 2014-09-06: 1.8 mg via INTRAVENOUS
  Administered 2014-09-06: 0.3 mg via INTRAVENOUS
  Administered 2014-09-06: 1.8 mg via INTRAVENOUS
  Administered 2014-09-06: 3 mg via INTRAVENOUS
  Administered 2014-09-07 (×2): 0.6 mg via INTRAVENOUS
  Administered 2014-09-07 (×3): 0.9 mg via INTRAVENOUS
  Administered 2014-09-07: 0.3 mg via INTRAVENOUS
  Administered 2014-09-08 (×3): 0.9 mg via INTRAVENOUS
  Administered 2014-09-08: 0 mg via INTRAVENOUS
  Administered 2014-09-08: 1.5 mg via INTRAVENOUS
  Administered 2014-09-09 (×2): 0.9 mg via INTRAVENOUS
  Administered 2014-09-09: 05:00:00 via INTRAVENOUS
  Administered 2014-09-09: 1.2 mg via INTRAVENOUS
  Administered 2014-09-09 (×3): 0.6 mg via INTRAVENOUS
  Administered 2014-09-10: 0.9 mg via INTRAVENOUS
  Administered 2014-09-10: 0.96 mg via INTRAVENOUS
  Administered 2014-09-10: 0.9 mg via INTRAVENOUS
  Administered 2014-09-10: 18:00:00 via INTRAVENOUS
  Administered 2014-09-10: 0.479 mg via INTRAVENOUS
  Administered 2014-09-11 (×2): 0.6 mg via INTRAVENOUS
  Administered 2014-09-11: 0.3 mg via INTRAVENOUS
  Administered 2014-09-11: 0.6 mg via INTRAVENOUS
  Administered 2014-09-11: 0.3 mg via INTRAVENOUS
  Administered 2014-09-11: 0.733 mg via INTRAVENOUS
  Administered 2014-09-12: 0 mg via INTRAVENOUS
  Administered 2014-09-12: 0.9 mg via INTRAVENOUS
  Administered 2014-09-12: 0.6 mg via INTRAVENOUS
  Administered 2014-09-12: 0.3 mg via INTRAVENOUS
  Filled 2014-09-01 (×8): qty 25

## 2014-09-01 MED ORDER — ONDANSETRON HCL 4 MG/2ML IJ SOLN
4.0000 mg | Freq: Four times a day (QID) | INTRAMUSCULAR | Status: DC | PRN
  Administered 2014-09-03 – 2014-09-06 (×4): 4 mg via INTRAVENOUS
  Filled 2014-09-01 (×3): qty 2

## 2014-09-01 MED ORDER — NALOXONE HCL 0.4 MG/ML IJ SOLN: 0.4000 mg | INTRAMUSCULAR | Status: DC | PRN

## 2014-09-01 MED ORDER — TRACE MINERALS CR-CU-MN-SE-ZN 10-1000-500-60 MCG/ML IV SOLN
INTRAVENOUS | Status: AC
  Administered 2014-09-01: 19:00:00 via INTRAVENOUS
  Filled 2014-09-01: qty 1440

## 2014-09-01 MED ORDER — HYDROMORPHONE HCL 1 MG/ML IJ SOLN
1.0000 mg | Freq: Once | INTRAMUSCULAR | Status: AC
  Administered 2014-09-01: 1 mg via INTRAVENOUS

## 2014-09-01 MED ORDER — MAGNESIUM SULFATE 2 GM/50ML IV SOLN
2.0000 g | Freq: Once | INTRAVENOUS | Status: AC
  Administered 2014-09-01: 2 g via INTRAVENOUS
  Filled 2014-09-01: qty 50

## 2014-09-01 MED ORDER — HYDROMORPHONE HCL 1 MG/ML IJ SOLN
0.5000 mg | INTRAMUSCULAR | Status: DC | PRN
  Administered 2014-09-03: 1 mg via INTRAVENOUS
  Filled 2014-09-01 (×2): qty 1

## 2014-09-01 MED ORDER — DIPHENHYDRAMINE HCL 50 MG/ML IJ SOLN: 12.5000 mg | Freq: Four times a day (QID) | INTRAMUSCULAR | Status: DC | PRN

## 2014-09-01 MED ORDER — DIPHENHYDRAMINE HCL 12.5 MG/5ML PO ELIX: 12.5000 mg | ORAL_SOLUTION | Freq: Four times a day (QID) | ORAL | Status: DC | PRN

## 2014-09-01 MED ORDER — SODIUM CHLORIDE 0.9 % IV SOLN
250.0000 mg | Freq: Four times a day (QID) | INTRAVENOUS | Status: DC
  Administered 2014-09-01 – 2014-09-06 (×21): 250 mg via INTRAVENOUS
  Filled 2014-09-01 (×22): qty 250

## 2014-09-01 NOTE — Progress Notes (Signed)
Nutrition Follow-up  DOCUMENTATION CODES:  Severe malnutrition in context of chronic illness, Underweight  INTERVENTION: - TPN per pharmacy - Monitor magnesium, potassium, and phosphorus daily for at least 3 days, MD to replete as needed, as pt is at risk for refeeding syndrome given severe malnutrition and poor po intake PTA. - Diet advancement per surgery - RD will continue to monitor  NUTRITION DIAGNOSIS:  Malnutrition related to chronic illness as evidenced by percent weight loss, severe depletion of body fat, severe depletion of muscle mass.  ongoing  GOAL:  Patient will meet greater than or equal to 90% of their needs  Improving with TPN  MONITOR:  Other (Comment), PO intake, Weight trends, Labs, I & O's (TPN)  REASON FOR ASSESSMENT:  Malnutrition Screening Tool    ASSESSMENT: 59 year old female with past medical history significant for rectal cancer with liver metastasis, rectal stent placement in 06/2014 (under Dr. Gearldine Shown care), status post 3 cycles of chemotherapy, last cycle completed 08/16/2014. She started to develop nausea and vomiting over past 24 hours prior to this admission.  5/29: - Pt followed by [facility], last seen 5/18. - Pt NPO for bowel rest d/t SBO. Pt states that she has not had anything by mouth x 5 days.  - Pt with NGT for suction, output 1000 ml. Recommend nutrition support if bowel rest to last > 7 days.  - Pt at risk for refeeding risk d/t severe malnutrition and poor PO intake PTA. - Pt with 22 lb weight loss since 3/02 (18% weight loss x 3 months, significant for time frame).  6/2 pt s/p PROCEDURE: Procedure(s): EXPLORATORY LAPAROTOMY SMALL BOWEL RESECTION LOW ANTERIOR RECTOSIGMOID RESECTION (including stent) with COLOSTOMY APPENDECTOMY HYSTERECTOMY SUPRACERVICAL ABDOMINAL Salpingo-oophorectomy TRANSVERSE COLON RESECTION WITH PARTIAL OMENTECTOMY DEBULKING OF PERITONEUM  6/3: Moved to SDU post-op. Pt tolerating TPN.  Currently running at goal rate of 60 mL/hr providing 1267 kcal (79% of needs) and 72 g protein (100% of needs.)   Labs and medications reviewed  Plan per pharmacy: At 1800 today:  Clinimix E 5/20 at 60 ml/hr.  Hold IV lipids for first 7 days while in ICU per protocol (6/3-6/9)  RD will continue to monitor.  Height:  Ht Readings from Last 1 Encounters:  08/25/14 5\' 7"  (1.702 m)    Weight:  Wt Readings from Last 1 Encounters:  09/01/14 119 lb 11.4 oz (54.3 kg)    Ideal Body Weight:  61.3 kg  Wt Readings from Last 10 Encounters:  09/01/14 119 lb 11.4 oz (54.3 kg)  08/25/14 110 lb (49.896 kg)  08/24/14 109 lb 12.8 oz (49.805 kg)  08/24/14 110 lb (49.896 kg)  08/16/14 110 lb 11.2 oz (50.213 kg)  08/02/14 113 lb (51.256 kg)  07/23/14 114 lb 5 oz (51.852 kg)  07/14/14 118 lb 14.4 oz (53.933 kg)  06/29/14 120 lb (54.432 kg)  05/31/14 125 lb (56.7 kg)    BMI:  Body mass index is 18.74 kg/(m^2).  Estimated Nutritional Needs:  Kcal:  1600-1800  Protein:  70-80g  Fluid:  1.6L/day  Skin:  Reviewed, no issues  Diet Order:  .TPN (CLINIMIX-E) Adult Diet NPO time specified Except for: Ice Chips .TPN (CLINIMIX-E) Adult  EDUCATION NEEDS:  No education needs identified at this time   Intake/Output Summary (Last 24 hours) at 09/01/14 1159 Last data filed at 09/01/14 1053  Gross per 24 hour  Intake 9202.34 ml  Output   3780 ml  Net 5422.34 ml    Last BM:  6/2- black/loose  Sydney Morrison Sydney Morrison, [place], [place]

## 2014-09-01 NOTE — Progress Notes (Signed)
ANTIBIOTIC CONSULT NOTE - FOLLOW UP  Pharmacy Consult for Primaxin Indication: Intra-abdominal Infection  Allergies  Allergen Reactions  . Avelox [Moxifloxacin Hcl In Nacl] Hives  . Codeine Hives  . Dextromethorphan Hives  . Erythromycin Hives  . Penicillins Hives    No problem with Imipenem  . Suprep [Na Sulfate-K Sulfate-Mg Sulf] Nausea And Vomiting    Patient Measurements: Height: 5\' 7"  (170.2 cm) Weight: 119 lb 11.4 oz (54.3 kg) IBW/kg (Calculated) : 61.6  Vital Signs: Temp: 98.5 F (36.9 C) (06/03 0800) Temp Source: Oral (06/03 0800) BP: 143/66 mmHg (06/03 0800) Pulse Rate: 93 (06/03 0800) Intake/Output from previous day: 06/02 0701 - 06/03 0700 In: 12062.3 [I.V.:8050; Blood:670; IV Piggyback:1810; TPN:1532.3] Out: 6920 [Urine:1925; Emesis/NG output:450; Drains:495; Blood:250]  Labs:  Recent Labs  08/30/14 0600 08/31/14 0600 08/31/14 2022 09/01/14 0545  WBC 4.7  --  14.0* 17.8*  HGB 10.4*  --  6.1* 9.0*  PLT 146*  --  99* 91*  CREATININE 0.63 0.46 0.74 0.51   Estimated Creatinine Clearance: 66.5 mL/min (by C-G formula based on Cr of 0.51). No results for input(s): VANCOTROUGH, VANCOPEAK, VANCORANDOM, GENTTROUGH, GENTPEAK, GENTRANDOM, TOBRATROUGH, TOBRAPEAK, TOBRARND, AMIKACINPEAK, AMIKACINTROU, AMIKACIN in the last 72 hours.   Assessment: Patient's a 59 y.o F with rectal cancer currently undergoing chemotherapy treatment. She was seen and treated in the ED for nausea on 5/26. She presented back to [facility] on 5/27 for further workup and management of persistent n/v. Abx were started empirically for suspected intra-abd infection. Of note, patient is allergic to PCN but tolerated primaxin in the past.  5/27 flagyl >> 5/29 5/27 primaxin >>  Micro: 5/28 Blood cxt x2: ngtd  Today, 09/01/2014:  Day #8 Primaxin  Tmax 99.1  WBC increased to 17.8  SCr remains low/stable with CrCl ~ 66 CG (using SCr 0.8)  Goal of Therapy:  Appropriate abx dosing, eradication  of infection.   Plan:   Change to Primaxin to 250 mg IV q6  Follow clinical course, renal function, culture results as available  Follow for de-escalation of antibiotics and LOT   Gretta Arab PharmD, BCPS Pager [phone] 09/01/2014 10:50 AM

## 2014-09-01 NOTE — Progress Notes (Signed)
[facility] PROGRESS NOTE  Sydney Morrison YQM:[id number] DOB: 05-30-1955 DOA: 08/25/2014 PCP: Delphina Cahill, MD   Brief narrative 59 y/o female with hx of rectal ca with liver metastasis, rectal stent placement in 06/2014 (under Dr. Gearldine Shown care), status post 3 cycles of chemotherapy, last cycle completed 08/16/2014 admitted on 5/27 with small bowel obstruction and failed conservative management. taken to OR on 6/2 for exploratory laparotomy with lower anterior rectosigmoid resection with colostomy.    Assessment/Plan: Small bowel obstruction Failed conservative mgmt. taken to OR  on 6/2 with  exploratory laparotomy with lower anterior rectosigmoid resection with colostomy. Also had appendectomy , hysterectomy and salpingoophorectomy, and debulking of peritoneum. hypotensive post op requiring aggressive IV hydration, central line placed and 2 units PRBC given for drop in H&H. Blood pressure subsequently improved..  -On Dilaudid PCA for pain. Continue NG tube and keep nothing by mouth. When necessary Zofran for nausea. NPO for now. continue NG tube, moist may be needed for several days.. -TNA started for surgery. Wound care recommendations for surgery. Continue abdominal drain.   SIRS on admission Afebrile . D/c flagyl. On empiric Primaxin. CT abd without abscess or perforation. Discontinue anti-biotics if leucocytosi improved in am   Rectal ca with liver mets  sees Dr Learta Codding. S/p 3 cycles of chemo  Chest pain and Aflutter with RVR  on 5/30 Cards consulted. 2D echo with normal EF. replenished low k and magnesium. No further episodes. No further recommendations per cardiology.  DIET: NPO  DVT prophylaxis: SCDs   Code Status: full code Family Communication: None at bedside Disposition Plan: stepdown monitoring for now   Consultants:  CCS  Procedures:  OR on 6/2  Antibiotics:  primaxin   HPI/Subjective: Seen and examined . Reports some generalized itching.  Refuses Benadryl or hydroxyzine. Denies any pain. Has some nausea.  Objective: Filed Vitals:   09/01/14 1400  BP: 120/53  Pulse: 77  Temp:   Resp: 22    Intake/Output Summary (Last 24 hours) at 09/01/14 1521 Last data filed at 09/01/14 1500  Gross per 24 hour  Intake 5324.34 ml  Output   3465 ml  Net 1859.34 ml   Filed Weights   08/31/14 0425 09/01/14 0339 09/01/14 0540  Weight: 46.3 kg (102 lb 1.2 oz) 51.7 kg (113 lb 15.7 oz) 54.3 kg (119 lb 11.4 oz)    Exam:   General:  Middle aged thin built female lying  in bed fatigued,  HEENT: dry mucosa, pallor+, supple neck, NG tube+  Cardiovascular: NS1&S2, no murmurs  Respiratory: clear b/l  Abdomen: laparotomy dressing, colostomy and abd drain in place. Bowel sounds present  Ext: warm, no edema   CNS: Fatigue, alert  and oriented    Data Reviewed: Basic Metabolic Panel:  Recent Labs Lab 08/25/14 1734  08/28/14 1735 08/29/14 1000 08/30/14 0600 08/31/14 0600 08/31/14 2022 09/01/14 0545  NA 136  < > 144 147* 147* 143 139 141  K 4.1  < > 2.8* 3.0* 3.2* 2.9* 3.4* 3.7  CL 99*  < > 110 114* 110 103 108 108  CO2 27  < > 20* 21* 22 28 25 28   GLUCOSE 104*  < > 105* 98 98 166* 248* 180*  BUN 29*  < > 47* 42* 39* 41* 29* 26*  CREATININE 0.61  < > 0.61 0.51 0.63 0.46 0.74 0.51  CALCIUM 8.5*  < > 8.6* 8.8* 9.2 9.4 7.5* 8.0*  MG 2.1  --  2.2  --  2.2 2.3  --  1.4*  PHOS 3.2  --   --   --  2.0* 3.0  --  2.7  < > = values in this interval not displayed. Liver Function Tests:  Recent Labs Lab 08/25/14 1734 08/28/14 1735 08/30/14 0600 08/31/14 0600  AST 20 22 26 22   ALT 14 19 16 16   ALKPHOS 99 75 84 78  BILITOT 0.6 0.9 1.0 0.5  PROT 6.6 6.6 6.8 7.0  ALBUMIN 3.5 3.2* 3.5 3.5   No results for input(s): LIPASE, AMYLASE in the last 168 hours. No results for input(s): AMMONIA in the last 168 hours. CBC:  Recent Labs Lab 08/25/14 1734 08/27/14 1020 08/29/14 1000 08/30/14 0600 08/31/14 2022 09/01/14 0545   WBC 5.4 2.3* 3.8* 4.7 14.0* 17.8*  NEUTROABS 3.6  --   --  1.3*  --   --   HGB 8.5* 9.2* 8.3* 10.4* 6.1* 9.0*  HCT 26.2* 29.3* 26.6* 33.0* 19.5* 26.7*  MCV 83.4 84.4 85.5 83.3 85.5 83.7  PLT 117* 124* 130* 146* 99* 91*   Cardiac Enzymes:  Recent Labs Lab 08/28/14 1735 08/28/14 2255 08/29/14 0455  TROPONINI <0.03 0.04* <0.03   BNP (last 3 results) No results for input(s): BNP in the last 8760 hours.  ProBNP (last 3 results) No results for input(s): PROBNP in the last 8760 hours.  CBG:  Recent Labs Lab 08/31/14 1540 08/31/14 1942 08/31/14 2351 09/01/14 0408 09/01/14 0814  GLUCAP 230* 221* 186* 168* 146*    Recent Results (from the past 240 hour(s))  Culture, blood (routine x 2)     Status: None   Collection Time: 08/26/14  7:05 AM  Result Value Ref Range Status   Specimen Description BLOOD RIGHT ARM  Final   Special Requests   Final    BOTTLES DRAWN AEROBIC AND ANAEROBIC 10CC BOTH BOTTLES   Culture   Final    NO GROWTH 5 DAYS Performed at [facility]    Report Status 09/01/2014 FINAL  Final  Culture, blood (routine x 2)     Status: None   Collection Time: 08/26/14  7:10 AM  Result Value Ref Range Status   Specimen Description BLOOD LEFT ARM  Final   Special Requests   Final    BOTTLES DRAWN AEROBIC AND ANAEROBIC 10CC BOTH BOTTLES   Culture   Final    NO GROWTH 5 DAYS Performed at [facility]    Report Status 09/01/2014 FINAL  Final  Surgical pcr screen     Status: None   Collection Time: 08/30/14 10:08 PM  Result Value Ref Range Status   MRSA, PCR NEGATIVE NEGATIVE Final   Staphylococcus aureus NEGATIVE NEGATIVE Final    Comment:        The Xpert SA Assay (FDA approved for NASAL specimens in patients over 62 years of age), is one component of a comprehensive surveillance program.  Test performance has been validated by [facility] for patients greater than or equal to 30 year old. It is not intended to diagnose infection nor  to guide or monitor treatment.      Studies: Dg Chest Port 1 View  08/31/2014   CLINICAL DATA:  Central line placement.  Initial encounter.  EXAM: PORTABLE CHEST - 1 VIEW  COMPARISON:  Chest radiograph performed 08/30/2014  FINDINGS: A left IJ line is noted ending about the distal SVC. A left-sided chest port is noted ending about the mid to distal SVC. The patient's enteric tube is seen extending below the diaphragm.  Pulmonary vascularity is at the upper limits of normal. Minimal bibasilar atelectasis is noted. No pleural effusion or pneumothorax is seen.  The cardiomediastinal silhouette is normal in size. No acute osseous abnormalities are identified.  IMPRESSION: 1. Left IJ line noted ending about the distal SVC. 2. Minimal bibasilar atelectasis noted.  Lungs otherwise clear.   Electronically Signed   By: Garald Balding M.D.   On: 08/31/2014 21:54    Scheduled Meds: . antiseptic oral rinse  7 mL Mouth Rinse BID  . HYDROmorphone PCA 0.3 mg/mL   Intravenous 6 times per day  . imipenem-cilastatin  250 mg Intravenous 4 times per day  . insulin aspart  0-15 Units Subcutaneous 6 times per day  . nicotine  21 mg Transdermal Daily  . pantoprazole (PROTONIX) IV  40 mg Intravenous QHS  . scopolamine  1 patch Transdermal Q72H  . sodium chloride  10-40 mL Intracatheter Q12H  . sodium chloride  3 mL Intravenous Q12H   Continuous Infusions: . Marland KitchenTPN (CLINIMIX-E) Adult 60 mL/hr at 08/31/14 1728  . Marland KitchenTPN (CLINIMIX-E) Adult    . sodium chloride 75 mL/hr (09/01/14 1044)  . 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

## 2014-09-01 NOTE — Progress Notes (Addendum)
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  Hydrate aggressive 1st 48hrs, then hopefully can diurese a little if needed    -NGT for expected prolonged ileus 3-10days post-op given malnourished with cancer & prolonged obstruction.  When +flatus in colostomy bag and low NGT < ~750/day, then could consider 24hr clamping trial w clears.  Again expect that will not be for many days     -TNA given persistent SBO (I never said otherwise).  Not great indefinite plan but reasonable over short term until obstruction treated/resolved  -ostomy care/training.  Very likely to be permanent as risk of pelvic carcinomatosis recurrence = more obstruction/recurrent cancer.  Would wait 2 years improved / stable before considering colostomy takedown (not likely)  -Pain control.  OnQ, ice/heat.  Dilaudid PCA - w BP better, inc to high  dose  -wound care  -Keep drain until output <73m/day x 2 days & serous/serosanguinous or >1 week & serous/serosanguinous (may have chronic ascites until nutrition improved)  -pt tends to dictate care but willing to take (most of) surgical advice.    -palliate nausea per pt/oncology/primary team.  Consider scopolamine - pt willing to try periop.  -VTE prophylaxis- SCDs for now.  Agree w holding heparin until 48hr stable Hgb = 6/4 PM  -mobilize as tolerated to help recovery  SAdin Hector M.D., F.A.C.S. Gastrointestinal and Minimally Invasive [facility] [address] [place] [phone] / Paging   09/01/2014  Subjective:  Nauseated - Ativan works best for her - refused other meds Sore Family at bedside    Objective:  Vital signs:  Filed Vitals:   09/01/14 0500 09/01/14 0540 09/01/14 0600 09/01/14 0700  BP: 109/54  111/62 123/62  Pulse: 73  78 75  Temp:      TempSrc:      Resp: _0 Height:      Weight:  54.3 kg (119 lb 11.4 oz)    SpO2: 100%  100% 100%    Last BM Date: 08/29/14  Intake/Output   Yesterday:  06/02 0701 - 06/03 0700 In: 12062.3 [I.V.:8050; Blood:670; IV Piggyback:1810; TPN:1532.3] Out: 6920 [[RNHAF:7903 Emesis/NG output:450; Drains:495; Blood:250] This shift:     Bowel function:  Flatus: min  BM: no  Drain: n/a  Physical Exam:  General: Pt awake/alert/oriented x4 in no acute distress.  Cachectic Eyes: PERRL, normal EOM.  Sclera clear.  No icterus Neuro: CN II-XII intact w/o focal sensory/motor deficits. Lymph: No head/neck/groin lymphadenopathy Psych:  No delerium/psychosis/paranoia.  Mildly groggy HENT: Normocephalic, Mucus membranes moist.  No  thrush Neck: Supple, No tracheal deviation Chest: No chest wall pain w good excursion CV:  Pulses intact.  Regular rhythm MS: Normal AROM mjr joints.  No obvious deformity Abdomen: Soft.  Moderately distended.   Firm RLQ abd wall mass.  No evidence of peritonitis.  No incarcerated hernias. Ext:  SCDs BLE.  No mjr edema.  No cyanosis Skin: No petechiae / purpura  Results:   Labs: Results for orders placed or performed during the hospital encounter of 08/25/14 (from the past 48 hour(s))  Glucose, capillary     Status: Abnormal   Collection Time: 08/30/14  8:48 PM  Result Value Ref Range   Glucose-Capillary 148 (H) 65 - 99 mg/dL  Surgical pcr screen     Status: None   Collection Time: 08/30/14 10:08 PM  Result Value Ref Range   MRSA, PCR NEGATIVE NEGATIVE   Staphylococcus aureus NEGATIVE NEGATIVE    Comment:        The Xpert SA Assay (FDA approved for NASAL specimens in patients over 95 years of age), is one component of a comprehensive surveillance program.  Test performance has been validated by [facility] for patients greater than or equal to 71 year old. It is not intended to diagnose infection nor to guide or monitor treatment.   Glucose, capillary     Status: Abnormal   Collection Time: 08/30/14 11:20 PM  Result Value Ref Range   Glucose-Capillary 150 (H) 65 - 99 mg/dL  Glucose, capillary     Status: Abnormal   Collection Time: 08/31/14  4:13 AM  Result Value Ref Range   Glucose-Capillary 167 (H) 65 - 99 mg/dL  Comprehensive metabolic panel     Status: Abnormal   Collection Time: 08/31/14  6:00 AM  Result Value Ref Range   Sodium 143 135 - 145 mmol/L   Potassium 2.9 (L) 3.5 - 5.1 mmol/L   Chloride 103 101 - 111 mmol/L   CO2 28 22 - 32 mmol/L   Glucose, Bld 166 (H) 65 - 99 mg/dL   BUN 41 (H) 6 - 20 mg/dL   Creatinine, Ser 0.46 0.44 - 1.00 mg/dL   Calcium 9.4 8.9 - 10.3 mg/dL   Total Protein 7.0 6.5 - 8.1 g/dL   Albumin 3.5 3.5 - 5.0 g/dL   AST 22 15 - 41 U/L   ALT 16 14 - 54 U/L   Alkaline Phosphatase 78 38 - 126 U/L   Total Bilirubin 0.5 0.3 - 1.2 mg/dL   GFR calc non Af Amer >60 >60 mL/min   GFR calc Af Amer >60 >60 mL/min    Comment: (NOTE) The eGFR has been  calculated using the CKD EPI equation. This calculation has not been validated in all clinical situations. eGFR's persistently <60 mL/min signify possible Chronic Kidney Disease.    Anion gap 12 5 - 15  Magnesium     Status: None   Collection Time: 08/31/14  6:00 AM  Result Value Ref Range   Magnesium 2.3 1.7 - 2.4 mg/dL  Phosphorus     Status: None   Collection Time: 08/31/14  6:00 AM  Result Value Ref Range   Phosphorus 3.0 2.5 - 4.6 mg/dL  Glucose, capillary     Status: Abnormal   Collection Time: 08/31/14  7:47 AM  Result Value Ref Range   Glucose-Capillary 162 (H) 65 - 99 mg/dL  Glucose, capillary     Status: Abnormal   Collection Time: 08/31/14  3:40 PM  Result Value Ref Range  Glucose-Capillary 230 (H) 65 - 99 mg/dL  Glucose, capillary     Status: Abnormal   Collection Time: 08/31/14  7:42 PM  Result Value Ref Range   Glucose-Capillary 221 (H) 65 - 99 mg/dL  Basic metabolic panel     Status: Abnormal   Collection Time: 08/31/14  8:22 PM  Result Value Ref Range   Sodium 139 135 - 145 mmol/L   Potassium 3.4 (L) 3.5 - 5.1 mmol/L   Chloride 108 101 - 111 mmol/L   CO2 25 22 - 32 mmol/L   Glucose, Bld 248 (H) 65 - 99 mg/dL   BUN 29 (H) 6 - 20 mg/dL   Creatinine, Ser 0.74 0.44 - 1.00 mg/dL   Calcium 7.5 (L) 8.9 - 10.3 mg/dL   GFR calc non Af Amer >60 >60 mL/min   GFR calc Af Amer >60 >60 mL/min    Comment: (NOTE) The eGFR has been calculated using the CKD EPI equation. This calculation has not been validated in all clinical situations. eGFR's persistently <60 mL/min signify possible Chronic Kidney Disease.    Anion gap 6 5 - 15  CBC     Status: Abnormal   Collection Time: 08/31/14  8:22 PM  Result Value Ref Range   WBC 14.0 (H) 4.0 - 10.5 K/uL   RBC 2.28 (L) 3.87 - 5.11 MIL/uL   Hemoglobin 6.1 (LL) 12.0 - 15.0 g/dL    Comment: DELTA CHECK NOTED REPEATED TO VERIFY CRITICAL RESULT CALLED TO, READ BACK BY AND VERIFIED WITH: Letitia Caul RN AT 2039 ON 6.2.16 BY MENDOZA  B     HCT 19.5 (L) 36.0 - 46.0 %   MCV 85.5 78.0 - 100.0 fL   MCH 26.8 26.0 - 34.0 pg   MCHC 31.3 30.0 - 36.0 g/dL   RDW 18.5 (H) 11.5 - 15.5 %   Platelets 99 (L) 150 - 400 K/uL    Comment: REPEATED TO VERIFY SPECIMEN CHECKED FOR CLOTS PLATELET COUNT CONFIRMED BY SMEAR   Prepare RBC     Status: None   Collection Time: 08/31/14  9:31 PM  Result Value Ref Range   Order Confirmation ORDER PROCESSED BY BLOOD BANK   Type and screen     Status: None (Preliminary result)   Collection Time: 08/31/14 10:15 PM  Result Value Ref Range   ABO/RH(D) A POS    Antibody Screen NEG    Sample Expiration 09/03/2014    Unit Number [id number]    Blood Component Type RED CELLS,LR    Unit division 00    Status of Unit ISSUED,FINAL    Transfusion Status OK TO TRANSFUSE    Crossmatch Result Compatible    Unit Number [id number]    Blood Component Type RED CELLS,LR    Unit division 00    Status of Unit ISSUED    Transfusion Status OK TO TRANSFUSE    Crossmatch Result Compatible   ABO/Rh     Status: None   Collection Time: 08/31/14 10:40 PM  Result Value Ref Range   ABO/RH(D) A POS   Glucose, capillary     Status: Abnormal   Collection Time: 08/31/14 11:51 PM  Result Value Ref Range   Glucose-Capillary 186 (H) 65 - 99 mg/dL  Glucose, capillary     Status: Abnormal   Collection Time: 09/01/14  4:08 AM  Result Value Ref Range   Glucose-Capillary 168 (H) 65 - 99 mg/dL  CBC     Status: Abnormal   Collection Time: 09/01/14  5:45  AM  Result Value Ref Range   WBC 17.8 (H) 4.0 - 10.5 K/uL   RBC 3.19 (L) 3.87 - 5.11 MIL/uL   Hemoglobin 9.0 (L) 12.0 - 15.0 g/dL    Comment: REPEATED TO VERIFY DELTA CHECK NOTED POST TRANSFUSION SPECIMEN    HCT 26.7 (L) 36.0 - 46.0 %   MCV 83.7 78.0 - 100.0 fL   MCH 28.2 26.0 - 34.0 pg   MCHC 33.7 30.0 - 36.0 g/dL   RDW 16.5 (H) 11.5 - 15.5 %   Platelets 91 (L) 150 - 400 K/uL  Basic metabolic panel     Status: Abnormal   Collection Time: 09/01/14  5:45 AM   Result Value Ref Range   Sodium 141 135 - 145 mmol/L   Potassium 3.7 3.5 - 5.1 mmol/L   Chloride 108 101 - 111 mmol/L   CO2 28 22 - 32 mmol/L   Glucose, Bld 180 (H) 65 - 99 mg/dL   BUN 26 (H) 6 - 20 mg/dL   Creatinine, Ser 0.51 0.44 - 1.00 mg/dL   Calcium 8.0 (L) 8.9 - 10.3 mg/dL   GFR calc non Af Amer >60 >60 mL/min   GFR calc Af Amer >60 >60 mL/min    Comment: (NOTE) The eGFR has been calculated using the CKD EPI equation. This calculation has not been validated in all clinical situations. eGFR's persistently <60 mL/min signify possible Chronic Kidney Disease.    Anion gap 5 5 - 15  Magnesium     Status: Abnormal   Collection Time: 09/01/14  5:45 AM  Result Value Ref Range   Magnesium 1.4 (L) 1.7 - 2.4 mg/dL  Phosphorus     Status: None   Collection Time: 09/01/14  5:45 AM  Result Value Ref Range   Phosphorus 2.7 2.5 - 4.6 mg/dL    Imaging / Studies: Dg Chest Port 1 View  08/31/2014   CLINICAL DATA:  Central line placement.  Initial encounter.  EXAM: PORTABLE CHEST - 1 VIEW  COMPARISON:  Chest radiograph performed 08/30/2014  FINDINGS: A left IJ line is noted ending about the distal SVC. A left-sided chest port is noted ending about the mid to distal SVC. The patient's enteric tube is seen extending below the diaphragm.  Pulmonary vascularity is at the upper limits of normal. Minimal bibasilar atelectasis is noted. No pleural effusion or pneumothorax is seen.  The cardiomediastinal silhouette is normal in size. No acute osseous abnormalities are identified.  IMPRESSION: 1. Left IJ line noted ending about the distal SVC. 2. Minimal bibasilar atelectasis noted.  Lungs otherwise clear.   Electronically Signed   By: Garald Balding M.D.   On: 08/31/2014 21:54   Dg Abd Acute W/chest  08/30/2014   CLINICAL DATA:  Follow-up small bowel obstruction. Intermittent abdominal pain with nausea and vomiting.  EXAM: DG ABDOMEN ACUTE W/ 1V CHEST  COMPARISON:  08/29/2014  FINDINGS: Large stool  burden throughout the colon. Continued mildly prominent mid abdominal small bowel loops with scattered air-fluid levels. No free air organomegaly. Presumed colonic stent in the pelvis.  Left Port-A-Cath remains in place, unchanged. Heart is normal size. Mild hyperinflation of the lungs without focal opacity or effusion.  IMPRESSION: Continued mild prominence of mid abdominal small bowel loops with scattered air-fluid levels. Large stool burden throughout the colon. No real change since prior study.   Electronically Signed   By: Rolm Baptise M.D.   On: 08/30/2014 10:34    Medications / Allergies: per chart  Antibiotics:  Anti-infectives    Start     Dose/Rate Route Frequency Ordered Stop   08/31/14 1030  clindamycin (CLEOCIN) 900 mg, gentamicin (GARAMYCIN) 240 mg in sodium chloride 0.9 % 1,000 mL for intraperitoneal lavage  Status:  Discontinued      Intraperitoneal To Surgery 08/31/14 1023 08/31/14 1513   08/31/14 0600  clindamycin (CLEOCIN) IVPB 900 mg     900 mg 100 mL/hr over 30 Minutes Intravenous 60 min pre-op 08/30/14 1218 08/31/14 0944   08/31/14 0600  [MAR Hold]  gentamicin (GARAMYCIN) 220 mg in dextrose 5 % 100 mL IVPB     (MAR Hold since 08/31/14 1010)   5 mg/kg  44.1 kg 105.5 mL/hr over 60 Minutes Intravenous 60 min pre-op 08/30/14 1218 08/31/14 1005   08/30/14 2100  imipenem-cilastatin (PRIMAXIN) 250 mg in sodium chloride 0.9 % 100 mL IVPB     250 mg 200 mL/hr over 30 Minutes Intravenous 3 times per day 08/30/14 1739     08/25/14 1800  imipenem-cilastatin (PRIMAXIN) 250 mg in sodium chloride 0.9 % 100 mL IVPB  Status:  Discontinued     250 mg 200 mL/hr over 30 Minutes Intravenous Every 6 hours 08/25/14 1716 08/30/14 1739   08/25/14 1800  metroNIDAZOLE (FLAGYL) IVPB 500 mg  Status:  Discontinued     500 mg 100 mL/hr over 60 Minutes Intravenous Every 8 hours 08/25/14 1716 08/27/14 1003        Note: Portions of this report may have been transcribed using voice recognition  software. Every effort was made to ensure accuracy; however, inadvertent computerized transcription errors may be present.   Any transcriptional errors that result from this process are unintentional.     Adin Hector, M.D., F.A.C.S. Gastrointestinal and Minimally Invasive Surgery [facility] [address], [place], [place] [address] [phone] Main / Paging   09/01/2014  CARE TEAM:  PCP: Delphina Cahill, MD  Outpatient Care Team: Patient Care Team: Delphina Cahill, MD as PCP - General (Internal Medicine) Ladell Pier, MD as Consulting Physician (Oncology) Carol Ada, MD as Consulting Physician (Gastroenterology) Michael Boston, MD as Consulting Physician (General Surgery)  Inpatient Treatment Team: Treatment Team: Attending Provider: Louellen Molder, MD; Attending Physician: Robbie Lis, MD; Technician: Kirstie Peri, NT; Rounding Team: Garner Gavel, MD; Consulting Physician: Ladell Pier, MD; Consulting Physician: Nolon Nations, MD; Technician: Emmaline Kluver, NT; Technician: Milagros Reap, NT; Technician: Mertha Baars, NT; Consulting Physician: Michae Kava Lbcardiology, MD; Registered Nurse: Michell Heinrich, RN; Consulting Physician: Md Pccm, MD

## 2014-09-01 NOTE — Progress Notes (Signed)
Oncology Nurse Navigator Documentation  Oncology Nurse Navigator Flowsheets 09/01/2014  Navigator Encounter Type Hospital f/u  Patient Visit Type Inpatient  Treatment Phase Post op day 2  Barriers/Navigation Needs No barriers at this time--anticipate she will need emotional support in dealing with ostomy in the future.  Time Spent with Patient 5

## 2014-09-01 NOTE — Consult Note (Signed)
WOC ostomy consult note Stoma type/location:  LLQ Colostomy Stomal assessment/size: 2 " edematous, pink and moist Peristomal assessment: Intact.  Midline abdominal incision with honeycomb dressing intact.   Treatment options for stomal/peristomal skin: None at this time.  Barrier will be trimmed to avoid the abdominal dressing.  Output No ostomy function at this time.  Ostomy pouching: 2 pc. 2 3/4" pouching system.  Barrier ring not used today, but was placed in the room above the sink with extra pouches incase needed at a later time for pouching in regards to the midline abdominal wound.  Education provided: Husband at bedside.  Patient is still groggy and has eyes closed.  Opens them only very briefly.  Spouse asks appropriate questions regarding cleaning, frequency of pouch change and emptying.  Pouch is changed today and measuring and cutting the barrier is discussed and demonstrated.  Spouse performs roll closure.  [place] nurse indicated that our team will continue to follow and teaching will be ongoing.  Supplies above sink.  Enrolled patient in [place] program: No will monitor edema. [place] team will continue to follow and remain available to patient, medical and nursing teams.  Domenic Moras RN BSN [place] Pager [phone]

## 2014-09-01 NOTE — Progress Notes (Signed)
PARENTERAL NUTRITION CONSULT NOTE - Follow-up  Pharmacy Consult for TPN Indication: Persistent SBO  Allergies  Allergen Reactions  . Avelox [Moxifloxacin Hcl In Nacl] Hives  . Codeine Hives  . Dextromethorphan Hives  . Erythromycin Hives  . Penicillins Hives    No problem with Imipenem  . Suprep [Na Sulfate-K Sulfate-Mg Sulf] Nausea And Vomiting    Patient Measurements: Height: 5\' 7"  (170.2 cm) Weight: 119 lb 11.4 oz (54.3 kg) IBW/kg (Calculated) : 61.6 Adjusted Body Weight: 44.1 kg Usual Weight: 50.4 kg  Vital Signs: Temp: 98.5 F (36.9 C) (06/03 0800) Temp Source: Oral (06/03 0800) BP: 143/66 mmHg (06/03 0800) Pulse Rate: 93 (06/03 0800) Intake/Output from previous day: 06/02 0701 - 06/03 0700 In: 12062.3 [I.V.:8050; Blood:670; IV Piggyback:1810; TPN:1532.3] Out: 6920 [Urine:1925; Emesis/NG output:450; Drains:495; Blood:250]  Labs:  Recent Labs  08/30/14 0600 08/31/14 2022 09/01/14 0545  WBC 4.7 14.0* 17.8*  HGB 10.4* 6.1* 9.0*  HCT 33.0* 19.5* 26.7*  PLT 146* 99* 91*     Recent Labs  08/30/14 0600 08/31/14 0600 08/31/14 2022 09/01/14 0545  NA 147* 143 139 141  K 3.2* 2.9* 3.4* 3.7  CL 110 103 108 108  CO2 22 28 25 28   GLUCOSE 98 166* 248* 180*  BUN 39* 41* 29* 26*  CREATININE 0.63 0.46 0.74 0.51  CALCIUM 9.2 9.4 7.5* 8.0*  MG 2.2 2.3  --  1.4*  PHOS 2.0* 3.0  --  2.7  PROT 6.8 7.0  --   --   ALBUMIN 3.5 3.5  --   --   AST 26 22  --   --   ALT 16 16  --   --   ALKPHOS 84 78  --   --   BILITOT 1.0 0.5  --   --   PREALBUMIN 13.2*  --   --   --   TRIG 226*  --   --   --    Estimated Creatinine Clearance: 66.5 mL/min (by C-G formula based on Cr of 0.51).    Recent Labs  08/31/14 2351 09/01/14 0408 09/01/14 0814  GLUCAP 186* 168* 146*   Medications, infusions:  . Marland KitchenTPN (CLINIMIX-E) Adult 60 mL/hr at 08/31/14 1728   And  . fat emulsion 10 kcal (08/31/14 1800)  . sodium chloride 75 mL/hr at 08/31/14 2000  . bupivacaine 0.25 % ON-Q pump  DUAL CATH 300 mL     Insulin Requirements: Sensitive SSI q4h: 12 units in last 24 hours.  Current Nutrition: NPO  IVF: NS at 75 ml/hr  Central access: CVC port L chest TPN start date: 6/1  ASSESSMENT                                                                                                          HPI: 81yoF with PMH metastatic rectal cancer and rectal stent 06/2014, s/p 3 cycles chemo presents 5/27 with N/V x 1 day.  Found to have SBO, likely secondary to pelvic tumor on CT.  NG tube placed and later removed once SBO improved on Abd  XR.  Started on CL diet, but persistent N/V prevent meaningful PO intake.  To start on TPN per pharmacy for nutritional supplementation until tolerating PO.  Significant events:  5/31: patient misunderstood Surgeon to imply that TPN was not going to start today and did not allow TPN to be initiated. 6/2: s/p exp laparotomy, SB resection, LAR, stent, colostomy, appendectomy, hysterectomy, salpingo-oophorectomy, transverse colon resection, debulking of peritoneum.  Today:   Glucose (goal <150): No Hx DM, but CBGs elevated with range 146 - 230.  Electrolytes: K (s/p KCl 7meq IV 6/2), Na, Phos WNL.  CorrCa 8.4 (improved s/p CaGluc 1g 6/2) and Mag 1.4 are low.    Renal - SCr low  LFTs - wnl  TGs - elevated at baseline with 226 (6/1)  Prealbumin - 13.2 (6/1)  NUTRITIONAL GOALS                                                                                             RD recs: Nutrition needs: 1600-1800 kcal, 70-80 grams protein.  Per new ASPEN and SCCM guidelines, HOLD lipids during the first seven days of therapy while pt meets ICU status. Goal will be to meet 100% of the patient's protein needs and approximately 80% of their caloric needs.  Clinimix E 5/20 @ 60 mL/hr to provide 72 grams protein (100% of goal) and 1267 kcal (79% of goal)   Glucose infusion rate will be 3.68 mg/kg/min (Maximum 4 mg/kg/min)  PLAN                                                                                                                          Magnesium 2g IVPB x1 dose  At 1800 today:  Clinimix E 5/20 at 60 ml/hr.  Hold IV lipids for first 7 days while in ICU per protocol (6/3-6/9)  TPN to contain standard multivitamins and trace elements.  IVF per MD orders, NS at 75 ml/hr.    Increase to Moderate SSI q4h   TPN lab panels on Mondays & Thursdays.  BMET, Mag and Phos labs in AM.   Gretta Arab PharmD, BCPS Pager [phone] 09/01/2014 9:49 AM

## 2014-09-01 NOTE — Progress Notes (Addendum)
Patient Profile: 59 y/o female with rectal cancer + liver metastasis and no prior cardiac history, admitted 08/25/14 for SBO. Cardiology consulted for chest pain and atrial flutter with RVR (in the setting of severe hypokalemia with K of 2.8) POD #1 post Exp. Lap, SB resection, LOW ANTERIOR RECTOSIGMOID RESECTION (including stent) with COLOSTOMY;  APPENDECTOMY HYSTERECTOMY SUPRACERVICAL ABDOMINAL Salpingo-oophorectomy;  TRANSVERSE COLON RESECTION WITH PARTIAL OMENTECTOMY; DEBULKING OF PERITONEUM.  Hypotensive post op. Transfused.     Subjective: No chest pain. Not conversant but shakes head to questions.    Objective: Vital signs in last 24 hours: Temp:  [97.5 F (36.4 C)-99.1 F (37.3 C)] 98.6 F (37 C) (06/03 0400) Pulse Rate:  [73-100] 75 (06/03 0700) Resp:  [15-25] 17 (06/03 0700) BP: (70-123)/(41-62) 123/62 mmHg (06/03 0700) SpO2:  [100 %] 100 % (06/03 0700) Weight:  [113 lb 15.7 oz (51.7 kg)-119 lb 11.4 oz (54.3 kg)] 119 lb 11.4 oz (54.3 kg) (06/03 0540) Weight change: 11 lb 14.5 oz (5.4 kg) Last BM Date: 08/29/14 Intake/Output from previous day: +12,062  06/02 0701 - 06/03 0700 In: 12062.3 [I.V.:8050; Blood:670; IV Piggyback:1810; TPN:1532.3] Out: 6920 [Urine:1925; Emesis/NG output:450; Drains:495; Blood:250] Intake/Output this shift:    PE: General:Pleasant affect, NAD Skin:Warm and dry, brisk capillary refill HEENT:normocephalic, sclera clear, mucus membranes moist Heart:S1S2 RRR without murmur, gallup, rub or click Lungs:clear, ant.  without rales, rhonchi, or wheezes Abd:, i did not hear BS, dressing in place Ext:no lower ext edema,  2+ radial pulses, SCDs in place Neuro:alert and oriented, MAE, follows commands, + facial symmetry  tele: maintaining SR  Lab Results:  Recent Labs  08/31/14 2022 09/01/14 0545  WBC 14.0* 17.8*  HGB 6.1* 9.0*  HCT 19.5* 26.7*  PLT 99* 91*   BMET  Recent Labs  08/31/14 2022 09/01/14 0545  NA 139 141  K 3.4* 3.7   CL 108 108  CO2 25 28  GLUCOSE 248* 180*  BUN 29* 26*  CREATININE 0.74 0.51  CALCIUM 7.5* 8.0*   No results for input(s): TROPONINI in the last 72 hours.  Invalid input(s): CK, MB  Lab Results  Component Value Date   TRIG 226* 08/30/2014   Lab Results  Component Value Date   HGBA1C 5.9* 08/30/2014     No results found for: TSH  Hepatic Function Panel  Recent Labs  08/31/14 0600  PROT 7.0  ALBUMIN 3.5  AST 22  ALT 16  ALKPHOS 78  BILITOT 0.5   No results for input(s): CHOL in the last 72 hours. No results for input(s): PROTIME in the last 72 hours.     Studies/Results: Dg Chest Port 1 View  08/31/2014   CLINICAL DATA:  Central line placement.  Initial encounter.  EXAM: PORTABLE CHEST - 1 VIEW  COMPARISON:  Chest radiograph performed 08/30/2014  FINDINGS: A left IJ line is noted ending about the distal SVC. A left-sided chest port is noted ending about the mid to distal SVC. The patient's enteric tube is seen extending below the diaphragm.  Pulmonary vascularity is at the upper limits of normal. Minimal bibasilar atelectasis is noted. No pleural effusion or pneumothorax is seen.  The cardiomediastinal silhouette is normal in size. No acute osseous abnormalities are identified.  IMPRESSION: 1. Left IJ line noted ending about the distal SVC. 2. Minimal bibasilar atelectasis noted.  Lungs otherwise clear.   Electronically Signed   By: Garald Balding M.D.   On: 08/31/2014 21:54   Dg Abd Acute W/chest  08/30/2014   CLINICAL DATA:  Follow-up small bowel obstruction. Intermittent abdominal pain with nausea and vomiting.  EXAM: DG ABDOMEN ACUTE W/ 1V CHEST  COMPARISON:  08/29/2014  FINDINGS: Large stool burden throughout the colon. Continued mildly prominent mid abdominal small bowel loops with scattered air-fluid levels. No free air organomegaly. Presumed colonic stent in the pelvis.  Left Port-A-Cath remains in place, unchanged. Heart is normal size. Mild hyperinflation of the  lungs without focal opacity or effusion.  IMPRESSION: Continued mild prominence of mid abdominal small bowel loops with scattered air-fluid levels. Large stool burden throughout the colon. No real change since prior study.   Electronically Signed   By: Rolm Baptise M.D.   On: 08/30/2014 10:34    Medications: I have reviewed the patient's current medications. Scheduled Meds: . antiseptic oral rinse  7 mL Mouth Rinse BID  . HYDROmorphone PCA 0.3 mg/mL   Intravenous 6 times per day  . imipenem-cilastatin  250 mg Intravenous 3 times per day  . insulin aspart  0-9 Units Subcutaneous 6 times per day  . nicotine  21 mg Transdermal Daily  . pantoprazole (PROTONIX) IV  40 mg Intravenous QHS  . scopolamine  1 patch Transdermal Q72H  . sodium chloride  10-40 mL Intracatheter Q12H  . sodium chloride  3 mL Intravenous Q12H   Continuous Infusions: . Marland KitchenTPN (CLINIMIX-E) Adult 60 mL/hr at 08/31/14 1728   And  . fat emulsion 10 kcal (08/31/14 1800)  . sodium chloride 75 mL/hr at 08/31/14 2000  . 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 Chest Pain: resolved. Now CP free. Likely secondary to atrial flutter w/ RVR. Cardiac enzymes cycled x 3 with flat trend. Only slight bump in troponin to 0.04 x 1. 2D echo shows normal LV function, wall motion and valve anatomy. Would not recommend any further cardiac w/u at this time.   2. Atrial Flutter w/ RVR: occurred in the setting of multiple medical issues including  hypokalemia, resolving SBO, metastatic rectal cancer, diarrhea and emesis. She had a recurrent but brief symptomatic episode ~5:30 am 2 days ago during and episode of emesis. Max rate >200. Symptoms resolved after emesis resolved.  No further episodes on telemetry post op Now maintaining NSR on monitor. Keep K WNL. Continue to monitor on telemetry. If recurrent sustained episodes, can treat with IV metoprolol for rate control.  3. Hypokalemia: K is improved now 3.7. Mg now at 1.4. Not tolerating PO well. Recommend IV supplementation. Continue to monitor closely.   4. SBO: did not resolve with medical management, underwent surgery yesterday- POST op day 1 s/p sm. Bowel resection LOW ANTERIOR RECTOSIGMOID RESECTION (including stent) with COLOSTOMY APPENDECTOMY HYSTERECTOMY SUPRACERVICAL ABDOMINAL Salpingo-oophorectomy TRANSVERSE COLON RESECTION WITH PARTIAL OMENTECTOMY DEBULKING OF PERITONEUM  5. Rectal CA w/ Liver Mets: management per oncology.   6. Anemia down to 6.1 today improved to 9.0, transfused 2 units  7. Hypotensive ost op transfusion helped. BP much improved today.  409 systolic     LOS: 7 days   Time spent with pt. :15 minutes. [facility] R  Nurse Practitioner  Certified Pager [phone] or after 5pm and on weekends call [phone] 09/01/2014, 8:06 AM   Patient seen and examined and history reviewed. Agree with above findings and plan. Seen post op day #1. No recurrent atrial flutter despite multiple stressors. Hypotensive post op. On no cardiac medications at present. If Atrial flutter recurs would consider IV amiodarone in the short term. Will check back on Monday. If acute cardiac issues over the weekend please call.   Peter Martinique, [place] 09/01/2014 9:26 AM

## 2014-09-01 NOTE — Progress Notes (Signed)
Date:  September 01, 2014 U.R. performed for needs and level of care. Hypotensive post op with cardiac history.  Multiple procedures performed in O.R. On [id number]/transferred to sdu post op   Will continue to follow for Case Management needs.  Velva Harman, RN, BSN, [place]   [phone]

## 2014-09-02 ENCOUNTER — Inpatient Hospital Stay (HOSPITAL_COMMUNITY): Payer: 59

## 2014-09-02 DIAGNOSIS — D696 Thrombocytopenia, unspecified: Secondary | ICD-10-CM

## 2014-09-02 LAB — CBC
HCT: 25 % — ABNORMAL LOW (ref 36.0–46.0)
Hemoglobin: 8.1 g/dL — ABNORMAL LOW (ref 12.0–15.0)
MCH: 28 pg (ref 26.0–34.0)
MCHC: 32.4 g/dL (ref 30.0–36.0)
MCV: 86.5 fL (ref 78.0–100.0)
PLATELETS: 77 10*3/uL — AB (ref 150–400)
RBC: 2.89 MIL/uL — ABNORMAL LOW (ref 3.87–5.11)
RDW: 17.4 % — AB (ref 11.5–15.5)
WBC: 23.8 10*3/uL — AB (ref 4.0–10.5)

## 2014-09-02 LAB — BASIC METABOLIC PANEL
Anion gap: 4 — ABNORMAL LOW (ref 5–15)
BUN: 20 mg/dL (ref 6–20)
CO2: 32 mmol/L (ref 22–32)
CREATININE: 0.35 mg/dL — AB (ref 0.44–1.00)
Calcium: 7.8 mg/dL — ABNORMAL LOW (ref 8.9–10.3)
Chloride: 103 mmol/L (ref 101–111)
GFR calc non Af Amer: >60 mL/min (ref >60)
GLUCOSE: 128 mg/dL — AB (ref 65–99)
POTASSIUM: 3 mmol/L — AB (ref 3.5–5.1)
SODIUM: 139 mmol/L (ref 135–145)

## 2014-09-02 LAB — PHOSPHORUS: PHOSPHORUS: 2.4 mg/dL — AB (ref 2.5–4.6)

## 2014-09-02 LAB — GLUCOSE, CAPILLARY
GLUCOSE-CAPILLARY: 124 mg/dL — AB (ref 65–99)
GLUCOSE-CAPILLARY: 147 mg/dL — AB (ref 65–99)
Glucose-Capillary: 127 mg/dL — ABNORMAL HIGH (ref 65–99)
Glucose-Capillary: 146 mg/dL — ABNORMAL HIGH (ref 65–99)
Glucose-Capillary: 120 mg/dL — ABNORMAL HIGH (ref 65–99)

## 2014-09-02 LAB — MAGNESIUM: Magnesium: 1.7 mg/dL (ref 1.7–2.4)

## 2014-09-02 MED ORDER — CHLORHEXIDINE GLUCONATE 0.12 % MT SOLN
15.0000 mL | Freq: Two times a day (BID) | OROMUCOSAL | Status: DC
  Administered 2014-09-03 – 2014-09-07 (×7): 15 mL via OROMUCOSAL
  Filled 2014-09-02 (×9): qty 15

## 2014-09-02 MED ORDER — POTASSIUM CHLORIDE 10 MEQ/100ML IV SOLN
10.0000 meq | INTRAVENOUS | Status: AC
  Administered 2014-09-02 (×4): 10 meq via INTRAVENOUS
  Filled 2014-09-02 (×4): qty 100

## 2014-09-02 MED ORDER — POTASSIUM PHOSPHATES 15 MMOLE/5ML IV SOLN
10.0000 mmol | Freq: Once | INTRAVENOUS | Status: AC
  Administered 2014-09-02: 10 mmol via INTRAVENOUS
  Filled 2014-09-02: qty 3.33

## 2014-09-02 MED ORDER — CETYLPYRIDINIUM CHLORIDE 0.05 % MT LIQD
7.0000 mL | Freq: Two times a day (BID) | OROMUCOSAL | Status: DC
  Administered 2014-09-03 – 2014-09-07 (×7): 7 mL via OROMUCOSAL

## 2014-09-02 MED ORDER — M.V.I. ADULT IV INJ
INJECTION | INTRAVENOUS | Status: AC
  Administered 2014-09-02: 18:00:00 via INTRAVENOUS
  Filled 2014-09-02: qty 1440

## 2014-09-02 MED ORDER — MAGNESIUM SULFATE 2 GM/50ML IV SOLN
2.0000 g | Freq: Once | INTRAVENOUS | Status: AC
Start: 2014-09-02 — End: 2014-09-02
  Administered 2014-09-02: 2 g via INTRAVENOUS
  Filled 2014-09-02: qty 50

## 2014-09-02 NOTE — Progress Notes (Signed)
Pt agreed to dangle at bedside for 10 min with moderate assist , but refused to continue oob to recliner, stating that Dr. Zella Richer told her she was not to to get up today. Pt. Tolerated well but did c/o of slight light-headedness during dangle.Pt refuses to turn every 2 hrs, favoring rt side. Incentive spirometry encouraged q 1  hr ; she often refuses when reminded but stated she achieved 750 ml when she did use it. Pt stated, "I don't care if I get a bed sore." Emotional support given, and PCA use encouraged. Lemont Sitzmann, Beverly Gust, RN

## 2014-09-02 NOTE — Progress Notes (Signed)
2 Days Post-Op  Subjective: Very sore.  Has not been OOB yet.  Objective: Vital signs in last 24 hours: Temp:  [98.5 F (36.9 C)-99.4 F (37.4 C)] 99.2 F (37.3 C) (06/04 0800) Pulse Rate:  [73-96] 81 (06/04 0800) Resp:  [16-22] 18 (06/04 0800) BP: (106-145)/(53-67) 138/64 mmHg (06/04 0800) SpO2:  [99 %-100 %] 100 % (06/04 0800) Last BM Date: 08/29/14  Intake/Output from previous day: 06/03 0701 - 06/04 0700 In: 3849 [I.V.:1800; IV Piggyback:450; TPN:1599] Out: 3280 [Urine:2060; Emesis/NG output:750; Drains:470] Intake/Output this shift: Total I/O In: 345 [I.V.:75; IV Piggyback:150; TPN:120] Out: -   PE: General- In NAD Abdomen-soft, serous drain output, colostomy edematous and viable, no output  Lab Results:   Recent Labs  09/01/14 0545 09/02/14 0800  WBC 17.8* 23.8*  HGB 9.0* 8.1*  HCT 26.7* 25.0*  PLT 91* 77*   BMET  Recent Labs  09/01/14 0545 09/02/14 0540  NA 141 139  K 3.7 3.0*  CL 108 103  CO2 28 32  GLUCOSE 180* 128*  BUN 26* 20  CREATININE 0.51 0.35*  CALCIUM 8.0* 7.8*   PT/INR No results for input(s): LABPROT, INR in the last 72 hours. Comprehensive Metabolic Panel:    Component Value Date/Time   NA 139 09/02/2014 0540   NA 141 09/01/2014 0545   NA 140 08/16/2014 1037   NA 138 08/02/2014 1112   K 3.0* 09/02/2014 0540   K 3.7 09/01/2014 0545   K 4.2 08/16/2014 1037   K 4.4 08/02/2014 1112   CL 103 09/02/2014 0540   CL 108 09/01/2014 0545   CO2 32 09/02/2014 0540   CO2 28 09/01/2014 0545   CO2 28 08/16/2014 1037   CO2 25 08/02/2014 1112   BUN 20 09/02/2014 0540   BUN 26* 09/01/2014 0545   BUN 23.1 08/16/2014 1037   BUN 22.0 08/02/2014 1112   CREATININE 0.35* 09/02/2014 0540   CREATININE 0.51 09/01/2014 0545   CREATININE 0.7 08/16/2014 1037   CREATININE 0.7 08/02/2014 1112   GLUCOSE 128* 09/02/2014 0540   GLUCOSE 180* 09/01/2014 0545   GLUCOSE 92 08/16/2014 1037   GLUCOSE 96 08/02/2014 1112   CALCIUM 7.8* 09/02/2014 0540   CALCIUM 8.0* 09/01/2014 0545   CALCIUM 9.5 08/16/2014 1037   CALCIUM 9.6 08/02/2014 1112   AST 22 08/31/2014 0600   AST 26 08/30/2014 0600   AST 21 08/16/2014 1037   AST 21 08/02/2014 1112   ALT 16 08/31/2014 0600   ALT 16 08/30/2014 0600   ALT 15 08/16/2014 1037   ALT 14 08/02/2014 1112   ALKPHOS 78 08/31/2014 0600   ALKPHOS 84 08/30/2014 0600   ALKPHOS 143 08/16/2014 1037   ALKPHOS 175* 08/02/2014 1112   BILITOT 0.5 08/31/2014 0600   BILITOT 1.0 08/30/2014 0600   BILITOT 0.28 08/16/2014 1037   BILITOT 0.28 08/02/2014 1112   PROT 7.0 08/31/2014 0600   PROT 6.8 08/30/2014 0600   PROT 7.5 08/16/2014 1037   PROT 7.6 08/02/2014 1112   ALBUMIN 3.5 08/31/2014 0600   ALBUMIN 3.5 08/30/2014 0600   ALBUMIN 3.8 08/16/2014 1037   ALBUMIN 3.6 08/02/2014 1112     Studies/Results: Dg Chest Port 1 View  08/31/2014   CLINICAL DATA:  Central line placement.  Initial encounter.  EXAM: PORTABLE CHEST - 1 VIEW  COMPARISON:  Chest radiograph performed 08/30/2014  FINDINGS: A left IJ line is noted ending about the distal SVC. A left-sided chest port is noted ending about the mid to distal SVC.  The patient's enteric tube is seen extending below the diaphragm.  Pulmonary vascularity is at the upper limits of normal. Minimal bibasilar atelectasis is noted. No pleural effusion or pneumothorax is seen.  The cardiomediastinal silhouette is normal in size. No acute osseous abnormalities are identified.  IMPRESSION: 1. Left IJ line noted ending about the distal SVC. 2. Minimal bibasilar atelectasis noted.  Lungs otherwise clear.   Electronically Signed   By: Garald Balding M.D.   On: 08/31/2014 21:54    Anti-infectives: Anti-infectives    Start     Dose/Rate Route Frequency Ordered Stop   09/01/14 1200  imipenem-cilastatin (PRIMAXIN) 250 mg in sodium chloride 0.9 % 100 mL IVPB     250 mg 200 mL/hr over 30 Minutes Intravenous 4 times per day 09/01/14 1051     08/31/14 1030  clindamycin (CLEOCIN) 900 mg,  gentamicin (GARAMYCIN) 240 mg in sodium chloride 0.9 % 1,000 mL for intraperitoneal lavage  Status:  Discontinued      Intraperitoneal To Surgery 08/31/14 1023 08/31/14 1513   08/31/14 0600  clindamycin (CLEOCIN) IVPB 900 mg     900 mg 100 mL/hr over 30 Minutes Intravenous 60 min pre-op 08/30/14 1218 08/31/14 0944   08/31/14 0600  [MAR Hold]  gentamicin (GARAMYCIN) 220 mg in dextrose 5 % 100 mL IVPB     (MAR Hold since 08/31/14 1010)   5 mg/kg  44.1 kg 105.5 mL/hr over 60 Minutes Intravenous 60 min pre-op 08/30/14 1218 08/31/14 1005   08/30/14 2100  imipenem-cilastatin (PRIMAXIN) 250 mg in sodium chloride 0.9 % 100 mL IVPB  Status:  Discontinued     250 mg 200 mL/hr over 30 Minutes Intravenous 3 times per day 08/30/14 1739 09/01/14 1051   08/25/14 1800  imipenem-cilastatin (PRIMAXIN) 250 mg in sodium chloride 0.9 % 100 mL IVPB  Status:  Discontinued     250 mg 200 mL/hr over 30 Minutes Intravenous Every 6 hours 08/25/14 1716 08/30/14 1739   08/25/14 1800  metroNIDAZOLE (FLAGYL) IVPB 500 mg  Status:  Discontinued     500 mg 100 mL/hr over 60 Minutes Intravenous Every 8 hours 08/25/14 1716 08/27/14 1003      Assessment: Small bowel obstruction, rectosigmoid cancer with perforation, pelvic carcinomatosis,  omental central caking/carcinoimatosis, liver metastases s/p EXPLORATORY LAPAROTOMY SMALL BOWEL RESECTION LOW ANTERIOR RECTOSIGMOID RESECTION (including stent) with COLOSTOMY APPENDECTOMY HYSTERECTOMY SUPRACERVICAL ABDOMINAL Salpingo-oophorectomy TRANSVERSE COLON RESECTION WITH PARTIAL OMENTECTOMY DEBULKING OF PERITONEUM 08/31/14-BP better; hemoglobin down some today.  Plan:  OOB today.    LOS: 8 days   Plan:    Odis Hollingshead 09/02/2014

## 2014-09-02 NOTE — Plan of Care (Signed)
Problem: Phase I Progression Outcomes Goal: Pain controlled with appropriate interventions Outcome: Progressing pca dilaudid Goal: OOB as tolerated unless otherwise ordered Outcome: Not Progressing dangled Goal: Voiding-avoid urinary catheter unless indicated Outcome: Not Progressing Catheter to be left in for 1 week

## 2014-09-02 NOTE — Progress Notes (Signed)
[facility] PROGRESS NOTE  SHRUTI ARREY UXL:[id number] DOB: Aug 03, 1955 DOA: 08/25/2014 PCP: Delphina Cahill, MD   Brief narrative 59 y/o female with hx of rectal ca with liver metastasis, rectal stent placement in 06/2014 (under Dr. Gearldine Shown care), status post 3 cycles of chemotherapy, last cycle completed 08/16/2014 admitted on 5/27 with small bowel obstruction and failed conservative management. taken to OR on 6/2 for exploratory laparotomy with lower anterior rectosigmoid resection with colostomy.    Assessment/Plan: Small bowel obstruction -Failed conservative mgmt. Taken to OR  on 6/2 with  exploratory laparotomy with lower anterior rectosigmoid resection with colostomy. Also had appendectomy , hysterectomy and salpingoophorectomy, and debulking of peritoneum. -Hypotensive post op requiring aggressive IV hydration, central line placed and 2 units PRBC given for drop in H&H. Blood pressure now stable.  -On Dilaudid PCA for pain. Continue NG tube and keep nothing by mouth. On TNA. When necessary Zofran for nausea. - Continue abdominal drain. PT eval.  -recommend foley for 7 days post op given bladder manipulation during surgery.    SIRS  Afebrile . D/c flagyl. On empiric Primaxin. CT abd without abscess or perforation. Leucocytosis worsened to 23 k today. Afebrile. Will check CXR.   Rectal ca with liver mets  sees Dr Learta Codding. S/p 3 cycles of chemo.  Chest pain and Aflutter with RVR  on 5/30 Cards consulted. 2D echo with normal EF. replenished low k and magnesium. No further episodes.   Hypokalemia/ hypophsphatemia Replenish  Thrombocytopenia  avoid heparin products. Monitor in am.    DIET: NPO  DVT prophylaxis: SCDs   Code Status: full code Family Communication: None at bedside Disposition Plan: stepdown monitoring for now   Consultants:  CCS  Procedures:  OR on 6/2  Antibiotics:  primaxin   HPI/Subjective: Seen and examined . Reports some  generalized itching. Refuses Benadryl or hydroxyzine. Denies any pain. Has some nausea.  Objective: Filed Vitals:   09/02/14 1200  BP: 126/65  Pulse: 85  Temp:   Resp: 20    Intake/Output Summary (Last 24 hours) at 09/02/14 1227 Last data filed at 09/02/14 1200  Gross per 24 hour  Intake   3913 ml  Output   3015 ml  Net    898 ml   Filed Weights   08/31/14 0425 09/01/14 0339 09/01/14 0540  Weight: 46.3 kg (102 lb 1.2 oz) 51.7 kg (113 lb 15.7 oz) 54.3 kg (119 lb 11.4 oz)    Exam:   General:  thin built female lying  in bed fatigued,  HEENT: dry mucosa, pallor+, supple neck, NG tube+  Cardiovascular: NS1&S2, no murmurs  Respiratory: clear b/l  Abdomen: laparotomy dressing, colostomy , abd drain in place. Bowel sounds present  Ext: warm, no edema   CNS: Fatigued, alert  and oriented    Data Reviewed: Basic Metabolic Panel:  Recent Labs Lab 08/28/14 1735  08/30/14 0600 08/31/14 0600 08/31/14 2022 09/01/14 0545 09/02/14 0540  NA 144  < > 147* 143 139 141 139  K 2.8*  < > 3.2* 2.9* 3.4* 3.7 3.0*  CL 110  < > 110 103 108 108 103  CO2 20*  < > 22 28 25 28  32  GLUCOSE 105*  < > 98 166* 248* 180* 128*  BUN 47*  < > 39* 41* 29* 26* 20  CREATININE 0.61  < > 0.63 0.46 0.74 0.51 0.35*  CALCIUM 8.6*  < > 9.2 9.4 7.5* 8.0* 7.8*  MG 2.2  --  2.2 2.3  --  1.4* 1.7  PHOS  --   --  2.0* 3.0  --  2.7 2.4*  < > = values in this interval not displayed. Liver Function Tests:  Recent Labs Lab 08/28/14 1735 08/30/14 0600 08/31/14 0600  AST 22 26 22   ALT 19 16 16   ALKPHOS 75 84 78  BILITOT 0.9 1.0 0.5  PROT 6.6 6.8 7.0  ALBUMIN 3.2* 3.5 3.5   No results for input(s): LIPASE, AMYLASE in the last 168 hours. No results for input(s): AMMONIA in the last 168 hours. CBC:  Recent Labs Lab 08/29/14 1000 08/30/14 0600 08/31/14 2022 09/01/14 0545 09/02/14 0800  WBC 3.8* 4.7 14.0* 17.8* 23.8*  NEUTROABS  --  1.3*  --   --   --   HGB 8.3* 10.4* 6.1* 9.0* 8.1*  HCT  26.6* 33.0* 19.5* 26.7* 25.0*  MCV 85.5 83.3 85.5 83.7 86.5  PLT 130* 146* 99* 91* 77*   Cardiac Enzymes:  Recent Labs Lab 08/28/14 1735 08/28/14 2255 08/29/14 0455  TROPONINI <0.03 0.04* <0.03   BNP (last 3 results) No results for input(s): BNP in the last 8760 hours.  ProBNP (last 3 results) No results for input(s): PROBNP in the last 8760 hours.  CBG:  Recent Labs Lab 09/01/14 1612 09/01/14 1936 09/01/14 2342 09/02/14 0306 09/02/14 0821  GLUCAP 124* 125* 124* 127* 146*    Recent Results (from the past 240 hour(s))  Culture, blood (routine x 2)     Status: None   Collection Time: 08/26/14  7:05 AM  Result Value Ref Range Status   Specimen Description BLOOD RIGHT ARM  Final   Special Requests   Final    BOTTLES DRAWN AEROBIC AND ANAEROBIC 10CC BOTH BOTTLES   Culture   Final    NO GROWTH 5 DAYS Performed at [facility]    Report Status 09/01/2014 FINAL  Final  Culture, blood (routine x 2)     Status: None   Collection Time: 08/26/14  7:10 AM  Result Value Ref Range Status   Specimen Description BLOOD LEFT ARM  Final   Special Requests   Final    BOTTLES DRAWN AEROBIC AND ANAEROBIC 10CC BOTH BOTTLES   Culture   Final    NO GROWTH 5 DAYS Performed at [facility]    Report Status 09/01/2014 FINAL  Final  Surgical pcr screen     Status: None   Collection Time: 08/30/14 10:08 PM  Result Value Ref Range Status   MRSA, PCR NEGATIVE NEGATIVE Final   Staphylococcus aureus NEGATIVE NEGATIVE Final    Comment:        The Xpert SA Assay (FDA approved for NASAL specimens in patients over 28 years of age), is one component of a comprehensive surveillance program.  Test performance has been validated by [facility] for patients greater than or equal to 8 year old. It is not intended to diagnose infection nor to guide or monitor treatment.      Studies: Dg Chest Port 1 View  08/31/2014   CLINICAL DATA:  Central line placement.  Initial  encounter.  EXAM: PORTABLE CHEST - 1 VIEW  COMPARISON:  Chest radiograph performed 08/30/2014  FINDINGS: A left IJ line is noted ending about the distal SVC. A left-sided chest port is noted ending about the mid to distal SVC. The patient's enteric tube is seen extending below the diaphragm.  Pulmonary vascularity is at the upper limits of normal. Minimal bibasilar atelectasis is noted. No pleural effusion or  pneumothorax is seen.  The cardiomediastinal silhouette is normal in size. No acute osseous abnormalities are identified.  IMPRESSION: 1. Left IJ line noted ending about the distal SVC. 2. Minimal bibasilar atelectasis noted.  Lungs otherwise clear.   Electronically Signed   By: Garald Balding M.D.   On: 08/31/2014 21:54    Scheduled Meds: . antiseptic oral rinse  7 mL Mouth Rinse BID  . antiseptic oral rinse  7 mL Mouth Rinse q12n4p  . chlorhexidine  15 mL Mouth Rinse BID  . HYDROmorphone PCA 0.3 mg/mL   Intravenous 6 times per day  . imipenem-cilastatin  250 mg Intravenous 4 times per day  . insulin aspart  0-15 Units Subcutaneous 6 times per day  . nicotine  21 mg Transdermal Daily  . pantoprazole (PROTONIX) IV  40 mg Intravenous QHS  . potassium chloride  10 mEq Intravenous Q1 Hr x 4  . potassium phosphate IVPB (mmol)  10 mmol Intravenous Once  . scopolamine  1 patch Transdermal Q72H  . sodium chloride  10-40 mL Intracatheter Q12H  . sodium chloride  3 mL Intravenous Q12H   Continuous Infusions: . Marland KitchenTPN (CLINIMIX-E) Adult 60 mL/hr at 09/01/14 1838  . Marland KitchenTPN (CLINIMIX-E) Adult    . sodium chloride 75 mL/hr (09/01/14 1044)  . bupivacaine 0.25 % ON-Q pump DUAL CATH 300 mL        Time spent: 25 minutes    Safir Michalec  [facility] Pager [phone] If 7PM-7AM, please contact night-coverage at [email or web address], password [facility] 09/02/2014, 12:27 PM  LOS: 8 days

## 2014-09-02 NOTE — Progress Notes (Signed)
PARENTERAL NUTRITION CONSULT NOTE - Follow-up  Pharmacy Consult for TPN Indication: Persistent SBO  Allergies  Allergen Reactions  . Avelox [Moxifloxacin Hcl In Nacl] Hives  . Codeine Hives  . Dextromethorphan Hives  . Erythromycin Hives  . Penicillins Hives    No problem with Imipenem  . Suprep [Na Sulfate-K Sulfate-Mg Sulf] Nausea And Vomiting    Patient Measurements: Height: 5\' 7"  (170.2 cm) Weight: 119 lb 11.4 oz (54.3 kg) IBW/kg (Calculated) : 61.6 Adjusted Body Weight: 44.1 kg Usual Weight: 50.4 kg  Vital Signs: Temp: 99.2 F (37.3 C) (06/04 0800) Temp Source: Oral (06/04 0800) BP: 143/61 mmHg (06/04 0600) Pulse Rate: 83 (06/04 0600) Intake/Output from previous day: 06/03 0701 - 06/04 0700 In: 3714 [I.V.:1725; IV Piggyback:450; TPN:1539] Out: 3280 [Urine:2060; Emesis/NG output:750; Drains:470]  Labs:  Recent Labs  08/31/14 2022 09/01/14 0545 09/02/14 0800  WBC 14.0* 17.8* 23.8*  HGB 6.1* 9.0* 8.1*  HCT 19.5* 26.7* 25.0*  PLT 99* 91* 77*     Recent Labs  08/31/14 0600 08/31/14 2022 09/01/14 0545 09/02/14 0540  NA 143 139 141 139  K 2.9* 3.4* 3.7 3.0*  CL 103 108 108 103  CO2 28 25 28  32  GLUCOSE 166* 248* 180* 128*  BUN 41* 29* 26* 20  CREATININE 0.46 0.74 0.51 0.35*  CALCIUM 9.4 7.5* 8.0* 7.8*  MG 2.3  --  1.4* 1.7  PHOS 3.0  --  2.7 2.4*  PROT 7.0  --   --   --   ALBUMIN 3.5  --   --   --   AST 22  --   --   --   ALT 16  --   --   --   ALKPHOS 78  --   --   --   BILITOT 0.5  --   --   --    Estimated Creatinine Clearance: 66.5 mL/min (by C-G formula based on Cr of 0.35).    Recent Labs  09/01/14 2342 09/02/14 0306 09/02/14 0821  GLUCAP 124* 127* 146*   Medications, infusions:  . Marland KitchenTPN (CLINIMIX-E) Adult 60 mL/hr at 09/01/14 1838  . sodium chloride 75 mL/hr (09/01/14 1044)  . bupivacaine 0.25 % ON-Q pump DUAL CATH 300 mL     Insulin Requirements: Sensitive SSI q4h: 13 units in last 24 hours.  Current Nutrition: NPO  IVF:  NS at 75 ml/hr  Central access: CVC port L chest TPN start date: 6/1  ASSESSMENT                                                                                                          HPI: 40yoF with PMH metastatic rectal cancer and rectal stent 06/2014, s/p 3 cycles chemo presents 5/27 with N/V x 1 day.  Found to have SBO, likely secondary to pelvic tumor on CT.  NG tube placed and later removed once SBO improved on Abd XR.  Started on CL diet, but persistent N/V prevent meaningful PO intake.  To start on TPN per pharmacy for nutritional supplementation until tolerating PO.  Significant events:  5/31: patient misunderstood Surgeon to imply that TPN was not going to start today and did not allow TPN to be initiated. 6/2: s/p exp laparotomy, SB resection, LAR, stent, colostomy, appendectomy, hysterectomy, salpingo-oophorectomy, transverse colon resection, debulking of peritoneum.  Today:   Glucose (goal <150): No Hx DM.  CBGs at goal with range 124-146 on SSI.    Electrolytes: Na WNL, Mag improved to WNL (s/p Mag 2g x2 doses 6/3, MD ordered Mag 2g 6/4), K low (MD ordered KCl 40 mEq), Phos low.  CorrCa 8.2 (s/p CaGluc 1g 6/2)  Renal: SCr low  LFTs: wnl (6/2)  TGs: elevated at baseline with 226 (6/1)  Prealbumin: 13.2 (6/1)  NUTRITIONAL GOALS                                                                                             RD recs: Nutrition needs: 1600-1800 kcal, 70-80 grams protein.  Per new ASPEN and SCCM guidelines, HOLD lipids during the first seven days of therapy while pt meets ICU status. Goal will be to meet 100% of the patient's protein needs and approximately 80% of their caloric needs.  Clinimix E 5/20 @ 60 mL/hr to provide 72 grams protein (100% of goal) and 1267 kcal (79% of goal)   Glucose infusion rate will be 3.68 mg/kg/min (Maximum 4 mg/kg/min)  PLAN                                                                                                                          K Phos 10 mmol IV x1 dose.  Will also provide 14.5 mEq of potassium - no change to existing KCl runs ordered by MD as expected replacement remains WNL.  At 1800 today:  Continue Clinimix E 5/20 IV at 60 ml/hr.  Hold IV lipids for first 7 days while in ICU per protocol (6/3-6/9)  TPN to contain standard multivitamins and trace elements.  IVF per MD orders, NS at 75 ml/hr.    Continue CBGs and moderate SSI q4h   TPN lab panels on Mondays & Thursdays.  BMET, Mag and Phos labs in AM.   Gretta Arab PharmD, BCPS Pager [phone] 09/02/2014 9:11 AM

## 2014-09-03 LAB — GLUCOSE, CAPILLARY
GLUCOSE-CAPILLARY: 152 mg/dL — AB (ref 65–99)
GLUCOSE-CAPILLARY: 143 mg/dL — AB (ref 65–99)
GLUCOSE-CAPILLARY: 160 mg/dL — AB (ref 65–99)
GLUCOSE-CAPILLARY: 131 mg/dL — AB (ref 65–99)
Glucose-Capillary: 136 mg/dL — ABNORMAL HIGH (ref 65–99)

## 2014-09-03 LAB — URINE MICROSCOPIC-ADD ON

## 2014-09-03 LAB — CBC
HCT: 22.4 % — ABNORMAL LOW (ref 36.0–46.0)
Hemoglobin: 7.2 g/dL — ABNORMAL LOW (ref 12.0–15.0)
MCH: 28.2 pg (ref 26.0–34.0)
MCHC: 32.1 g/dL (ref 30.0–36.0)
MCV: 87.8 fL (ref 78.0–100.0)
Platelets: 64 10*3/uL — ABNORMAL LOW (ref 150–400)
RBC: 2.55 MIL/uL — ABNORMAL LOW (ref 3.87–5.11)
RDW: 17.9 % — ABNORMAL HIGH (ref 11.5–15.5)
WBC: 23 10*3/uL — ABNORMAL HIGH (ref 4.0–10.5)

## 2014-09-03 LAB — BASIC METABOLIC PANEL
ANION GAP: 6 (ref 5–15)
BUN: 20 mg/dL (ref 6–20)
CO2: 30 mmol/L (ref 22–32)
Calcium: 7.5 mg/dL — ABNORMAL LOW (ref 8.9–10.3)
Chloride: 100 mmol/L — ABNORMAL LOW (ref 101–111)
Creatinine, Ser: 0.31 mg/dL — ABNORMAL LOW (ref 0.44–1.00)
GFR calc non Af Amer: >60 mL/min (ref >60)
Glucose, Bld: 141 mg/dL — ABNORMAL HIGH (ref 65–99)
POTASSIUM: 3.3 mmol/L — AB (ref 3.5–5.1)
Sodium: 136 mmol/L (ref 135–145)

## 2014-09-03 LAB — URINALYSIS, ROUTINE W REFLEX MICROSCOPIC
BILIRUBIN URINE: NEGATIVE
Glucose, UA: 250 mg/dL — AB
Ketones, ur: NEGATIVE mg/dL
Leukocytes, UA: NEGATIVE
NITRITE: NEGATIVE
Protein, ur: 30 mg/dL — AB
Specific Gravity, Urine: 1.017 (ref 1.005–1.030)
UROBILINOGEN UA: 0.2 mg/dL (ref 0.0–1.0)
pH: 6 (ref 5.0–8.0)

## 2014-09-03 LAB — TYPE AND SCREEN
ABO/RH(D): A POS
Antibody Screen: NEGATIVE
Unit division: 0
Unit division: 0

## 2014-09-03 LAB — PHOSPHORUS: Phosphorus: 2.2 mg/dL — ABNORMAL LOW (ref 2.5–4.6)

## 2014-09-03 LAB — MAGNESIUM: Magnesium: 1.6 mg/dL — ABNORMAL LOW (ref 1.7–2.4)

## 2014-09-03 MED ORDER — M.V.I. ADULT IV INJ
INJECTION | INTRAVENOUS | Status: AC
  Administered 2014-09-03: 17:00:00 via INTRAVENOUS
  Filled 2014-09-03: qty 1440

## 2014-09-03 MED ORDER — POTASSIUM CHLORIDE 10 MEQ/100ML IV SOLN
10.0000 meq | INTRAVENOUS | Status: AC
Start: 2014-09-03 — End: 2014-09-03
  Administered 2014-09-03 (×2): 10 meq via INTRAVENOUS
  Filled 2014-09-03 (×2): qty 100

## 2014-09-03 MED ORDER — MAGNESIUM SULFATE 2 GM/50ML IV SOLN
2.0000 g | Freq: Once | INTRAVENOUS | Status: AC
  Administered 2014-09-03: 2 g via INTRAVENOUS
  Filled 2014-09-03: qty 50

## 2014-09-03 MED ORDER — POTASSIUM PHOSPHATES 15 MMOLE/5ML IV SOLN
20.0000 mmol | Freq: Once | INTRAVENOUS | Status: AC
  Administered 2014-09-03: 20 mmol via INTRAVENOUS
  Filled 2014-09-03: qty 6.67

## 2014-09-03 MED ORDER — CALCIUM GLUCONATE 10 % IV SOLN
1.0000 g | Freq: Once | INTRAVENOUS | Status: AC
Start: 2014-09-03 — End: 2014-09-03
  Administered 2014-09-03: 1 g via INTRAVENOUS
  Filled 2014-09-03: qty 10

## 2014-09-03 NOTE — Progress Notes (Signed)
3 Days Post-Op  Subjective: Less sore.  In better spirits.  Objective: Vital signs in last 24 hours: Temp:  [99.2 F (37.3 C)-100 F (37.8 C)] 99.6 F (37.6 C) (06/05 0800) Pulse Rate:  [78-97] 86 (06/05 0400) Resp:  [16-23] 23 (06/05 0800) BP: (124-156)/(53-66) 132/59 mmHg (06/05 0400) SpO2:  [35 %-100 %] 99 % (06/05 0800) Last BM Date: 08/29/14  Intake/Output from previous day: 06/04 0701 - 06/05 0700 In: 3804 [I.V.:1350; NG/GT:180; IV Piggyback:918; TPN:1266] Out: 3250 [Urine:2120; Emesis/NG output:1100; Drains:30] Intake/Output this shift: Total I/O In: 495 [I.V.:225; Other:30; TPN:240] Out: 40 [Urine:550; Drains:30; Stool:25]  PE: General- In NAD.  Bilious ng output Abdomen-soft, serous drain output, colostomy edematous and viable with thin light brown fluid output, midline wound open and clean, hypoactive bowel sounds  Lab Results:   Recent Labs  09/01/14 0545 09/02/14 0800  WBC 17.8* 23.8*  HGB 9.0* 8.1*  HCT 26.7* 25.0*  PLT 91* 77*   BMET  Recent Labs  09/02/14 0540 09/03/14 0500  NA 139 136  K 3.0* 3.3*  CL 103 100*  CO2 32 30  GLUCOSE 128* 141*  BUN 20 20  CREATININE 0.35* 0.31*  CALCIUM 7.8* 7.5*   PT/INR No results for input(s): LABPROT, INR in the last 72 hours. Comprehensive Metabolic Panel:    Component Value Date/Time   NA 136 09/03/2014 0500   NA 139 09/02/2014 0540   NA 140 08/16/2014 1037   NA 138 08/02/2014 1112   K 3.3* 09/03/2014 0500   K 3.0* 09/02/2014 0540   K 4.2 08/16/2014 1037   K 4.4 08/02/2014 1112   CL 100* 09/03/2014 0500   CL 103 09/02/2014 0540   CO2 30 09/03/2014 0500   CO2 32 09/02/2014 0540   CO2 28 08/16/2014 1037   CO2 25 08/02/2014 1112   BUN 20 09/03/2014 0500   BUN 20 09/02/2014 0540   BUN 23.1 08/16/2014 1037   BUN 22.0 08/02/2014 1112   CREATININE 0.31* 09/03/2014 0500   CREATININE 0.35* 09/02/2014 0540   CREATININE 0.7 08/16/2014 1037   CREATININE 0.7 08/02/2014 1112   GLUCOSE 141*  09/03/2014 0500   GLUCOSE 128* 09/02/2014 0540   GLUCOSE 92 08/16/2014 1037   GLUCOSE 96 08/02/2014 1112   CALCIUM 7.5* 09/03/2014 0500   CALCIUM 7.8* 09/02/2014 0540   CALCIUM 9.5 08/16/2014 1037   CALCIUM 9.6 08/02/2014 1112   AST 22 08/31/2014 0600   AST 26 08/30/2014 0600   AST 21 08/16/2014 1037   AST 21 08/02/2014 1112   ALT 16 08/31/2014 0600   ALT 16 08/30/2014 0600   ALT 15 08/16/2014 1037   ALT 14 08/02/2014 1112   ALKPHOS 78 08/31/2014 0600   ALKPHOS 84 08/30/2014 0600   ALKPHOS 143 08/16/2014 1037   ALKPHOS 175* 08/02/2014 1112   BILITOT 0.5 08/31/2014 0600   BILITOT 1.0 08/30/2014 0600   BILITOT 0.28 08/16/2014 1037   BILITOT 0.28 08/02/2014 1112   PROT 7.0 08/31/2014 0600   PROT 6.8 08/30/2014 0600   PROT 7.5 08/16/2014 1037   PROT 7.6 08/02/2014 1112   ALBUMIN 3.5 08/31/2014 0600   ALBUMIN 3.5 08/30/2014 0600   ALBUMIN 3.8 08/16/2014 1037   ALBUMIN 3.6 08/02/2014 1112     Studies/Results: Dg Chest Port 1 View  09/02/2014   CLINICAL DATA:  Generalized body aches.  Weakness.  Leukocytosis.  EXAM: PORTABLE CHEST - 1 VIEW  COMPARISON:  08/31/2014  FINDINGS: Support lines and tubes in appropriate position. Heart size  is normal. Both lungs remain clear. No evidence of pleural effusion or pneumothorax.  IMPRESSION: No active disease.   Electronically Signed   By: Earle Gell M.D.   On: 09/02/2014 17:31    Anti-infectives: Anti-infectives    Start     Dose/Rate Route Frequency Ordered Stop   09/01/14 1200  imipenem-cilastatin (PRIMAXIN) 250 mg in sodium chloride 0.9 % 100 mL IVPB     250 mg 200 mL/hr over 30 Minutes Intravenous 4 times per day 09/01/14 1051     08/31/14 1030  clindamycin (CLEOCIN) 900 mg, gentamicin (GARAMYCIN) 240 mg in sodium chloride 0.9 % 1,000 mL for intraperitoneal lavage  Status:  Discontinued      Intraperitoneal To Surgery 08/31/14 1023 08/31/14 1513   08/31/14 0600  clindamycin (CLEOCIN) IVPB 900 mg     900 mg 100 mL/hr over 30  Minutes Intravenous 60 min pre-op 08/30/14 1218 08/31/14 0944   08/31/14 0600  [MAR Hold]  gentamicin (GARAMYCIN) 220 mg in dextrose 5 % 100 mL IVPB     (MAR Hold since 08/31/14 1010)   5 mg/kg  44.1 kg 105.5 mL/hr over 60 Minutes Intravenous 60 min pre-op 08/30/14 1218 08/31/14 1005   08/30/14 2100  imipenem-cilastatin (PRIMAXIN) 250 mg in sodium chloride 0.9 % 100 mL IVPB  Status:  Discontinued     250 mg 200 mL/hr over 30 Minutes Intravenous 3 times per day 08/30/14 1739 09/01/14 1051   08/25/14 1800  imipenem-cilastatin (PRIMAXIN) 250 mg in sodium chloride 0.9 % 100 mL IVPB  Status:  Discontinued     250 mg 200 mL/hr over 30 Minutes Intravenous Every 6 hours 08/25/14 1716 08/30/14 1739   08/25/14 1800  metroNIDAZOLE (FLAGYL) IVPB 500 mg  Status:  Discontinued     500 mg 100 mL/hr over 60 Minutes Intravenous Every 8 hours 08/25/14 1716 08/27/14 1003      Assessment: Small bowel obstruction, rectosigmoid cancer with perforation, pelvic carcinomatosis,  omental central caking/carcinoimatosis, liver metastases s/p expl lap, SB resection, rectosigmoid colon resection, colostomy, appendectomy, TAH, transverse colon resection, partial omentectomy, peritoneal debulking 08/31/14 (Dr. Pati Gallo op ileus as expected; wound looks good; on TPN for nutrition and abxs due to perforation.    Plan:  Up in chair.  Wait for bowel function to return.  Start dressing changes.    LOS: 9 days    Miyah Hampshire J 09/03/2014

## 2014-09-03 NOTE — Plan of Care (Signed)
Problem: Phase I Progression Outcomes Goal: OOB as tolerated unless otherwise ordered Outcome: Progressing OOB to chair x4 hours, ambulated 6 feet in room with PT

## 2014-09-03 NOTE — Evaluation (Signed)
Physical Therapy Evaluation Patient Details Name: Sydney Morrison MRN: [id number] DOB: 17-Apr-1955 Today's Date: 09/03/2014   History of Present Illness  59 y/o female with hx of rectal ca with liver metastasis, rectal stent placement in 06/2014 (under Dr. Gearldine Shown care), status post 3 cycles of chemotherapy, last cycle completed 08/16/2014 admitted on 5/27 with small bowel obstruction and failed conservative management. taken to OR on 6/2 for exploratory laparotomy with lower anterior rectosigmoid resection with colostomy.  Clinical Impression  Pt admitted with above diagnosis. Pt currently with functional limitations due to the deficits listed below (see PT Problem List).  Pt will benefit from skilled PT to increase their independence and safety with mobility to allow discharge to the venue listed below.   Will follow for needs, hopeful for home but will depend on progress adn LOS; pt husband present and supportive     Follow Up Recommendations Home health PT;SNF (depending on progress)    Equipment Recommendations  Rolling walker with 5" wheels    Recommendations for Other Services       Precautions / Restrictions Precautions Precautions: Fall Restrictions Weight Bearing Restrictions: No      Mobility  Bed Mobility Overal bed mobility: Needs Assistance;+2 for physical assistance;+ 2 for safety/equipment Bed Mobility: Rolling;Sidelying to Sit Rolling: +2 for physical assistance;Mod assist Sidelying to sit: +2 for physical assistance;Max assist Supine to sit: +2 for physical assistance     General bed mobility comments: multi-modal cues fo rtechnique, assist for Les and trunk  Transfers Overall transfer level: Needs assistance Equipment used: Rolling walker (2 wheeled) Transfers: Sit to/from Stand Sit to Stand: +2 physical assistance;Mod assist;+2 safety/equipment         General transfer comment: incr time, cues for task completion, effort, to fully extend  knees  Ambulation/Gait Ambulation/Gait assistance: +2 physical assistance;Mod assist Ambulation Distance (Feet): 6 Feet Assistive device: Rolling walker (2 wheeled)   Gait velocity: pt with 3/4 DOE, O2 sats 97% on 2L at rest, 91% on RA durin gamb, HR 109-129; BP 127/61 Gait velocity interpretation: Below normal speed for age/gender General Gait Details: cues for step length and RW safety;  assist with balance, maneuvering RW;   Stairs            Wheelchair Mobility    Modified Rankin (Stroke Patients Only)       Balance Overall balance assessment: Needs assistance Sitting-balance support: Feet supported;Single extremity supported Sitting balance-Leahy Scale: Fair   Postural control: Posterior lean   Standing balance-Leahy Scale: Zero                               Pertinent Vitals/Pain Pain Assessment: 0-10 Pain Score: 4  Pain Location: abd Pain Descriptors / Indicators: Sore Pain Intervention(s): Limited activity within patient's tolerance;Monitored during session;Repositioned    Home Living Family/patient expects to be discharged to:: Private residence Living Arrangements: Spouse/significant other;Children   Type of Home: House       Home Layout: Two level;1/2 bath on main level;Bed/bath upstairs Home Equipment: None Additional Comments: husband works but is on [employer]    Prior Function Level of Independence: Independent               Journalist, newspaper        Extremity/Trunk Assessment   Upper Extremity Assessment: Generalized weakness           Lower Extremity Assessment: Generalized weakness      Cervical / Trunk  Assessment:  (pt positions in  R lateral cervical flexion; able to correct with incr time and facilitation; pt has been positioning this way since central line placed per husband)  Communication   Communication: No difficulties  Cognition Arousal/Alertness: Lethargic;Suspect due to medications Behavior During  Therapy: [facility] for tasks assessed/performed Overall Cognitive Status: Within Functional Limits for tasks assessed                      General Comments      Exercises        Assessment/Plan    PT Assessment Patient needs continued PT services  PT Diagnosis Difficulty walking   PT Problem List Decreased strength;Decreased activity tolerance;Decreased mobility;Decreased knowledge of use of DME  PT Treatment Interventions DME instruction;Gait training;Functional mobility training;Therapeutic activities;Patient/family education;Therapeutic exercise   PT Goals (Current goals can be found in the Care Plan section) Acute Rehab PT Goals Patient Stated Goal: home per husband PT Goal Formulation: With patient/family Time For Goal Achievement: 09/17/14 Potential to Achieve Goals: Good    Frequency Min 3X/week   Barriers to discharge        Co-evaluation               End of Session   Activity Tolerance: Patient limited by fatigue;Patient limited by pain Patient left: in chair;with call bell/phone within reach;with family/visitor present Nurse Communication: Mobility status         Time: 4163-8453 PT Time Calculation (min) (ACUTE ONLY): 38 min   Charges:   PT Evaluation $Initial PT Evaluation Tier I: 1 Procedure PT Treatments $Gait Training: 8-22 mins $Therapeutic Activity: 8-22 mins   PT G Codes:        Mataeo Ingwersen 09-07-14, 4:25 PM

## 2014-09-03 NOTE — Plan of Care (Signed)
Problem: Consults Goal: Skin Care Protocol Initiated - if Braden Score 18 or less If consults are not indicated, leave blank or document N/A  Outcome: Not Progressing Per nursing note, pt refused to turn/mobilize through much of POD #2. On POD #3, pt developed stage II pressure ulcer to sacrum. Pink foam Allevyn sacral dressing placed. Patient and spouse notified of pressure ulcer. Reinforced the importance of mobility and turning to pt and pt has since been agreeable to turn. MD Dhungel notified.

## 2014-09-03 NOTE — Progress Notes (Signed)
[facility] PROGRESS NOTE  Sydney Morrison NKN:[id number] DOB: 1955-06-20 DOA: 08/25/2014 PCP: Delphina Cahill, MD   Brief narrative 59 y/o female with hx of rectal ca with liver metastasis, rectal stent placement in 06/2014 (under Dr. Gearldine Shown care), status post 3 cycles of chemotherapy, last cycle completed 08/16/2014 admitted on 5/27 with small bowel obstruction and failed conservative management. taken to OR on 6/2 for exploratory laparotomy with lower anterior rectosigmoid resection with colostomy.    Assessment/Plan: Small bowel obstruction -Failed conservative mgmt.  OR  on 6/2 with  exploratory laparotomy with lower anterior rectosigmoid resection with colostomy. Also had appendectomy , hysterectomy and salpingoophorectomy, and debulking of peritoneum. -Hypotensive post op requiring aggressive IV hydration, central line placed and 2 units PRBC given for drop in H&H. Blood pressure in hemoglobin -On Dilaudid PCA for pain. Continue NG tube and keep nothing by mouth. On TNA. When necessary Zofran for nausea. - Continue abdominal drain. PT eval.  -recommend foley for 7 days post op given bladder manipulation during surgery.    SIRS  Afebrile . D/c flagyl. On empiric Primaxin. Marland Kitchen Leucocytosis worsened to 23 k. Chest x-ray unremarkable. Check UA. May need CT of her abdomen if leukocytosis persists, to  rule out for any fluid collection..  Rectal ca with liver mets  sees Dr Learta Codding. S/p 3 cycles of chemo.  Chest pain and Aflutter with RVR  on 5/30 cardiology consulted. 2D echo with normal EF. replenished low k and magnesium. No further episodes.   Hypokalemia/ hypophsphatemia Replenish  Thrombocytopenia  avoid heparin products. Monitor closely.  Stage II pressure ulcer Pink  foam applied.   DIET: NPO  DVT prophylaxis: SCDs   Code Status: full code Family Communication: None at bedside Disposition Plan: stepdown monitoring for  now   Consultants:  CCS  Procedures:  OR on 6/2  Antibiotics:  primaxin   HPI/Subjective: Seen and examined . Complains of abdominal pain but has not been using PCA much.  Objective: Filed Vitals:   09/03/14 1413  BP: 127/61  Pulse: 101  Temp:   Resp: 19    Intake/Output Summary (Last 24 hours) at 09/03/14 1505 Last data filed at 09/03/14 1400  Gross per 24 hour  Intake   3731 ml  Output   3230 ml  Net    501 ml   Filed Weights   08/31/14 0425 09/01/14 0339 09/01/14 0540  Weight: 46.3 kg (102 lb 1.2 oz) 51.7 kg (113 lb 15.7 oz) 54.3 kg (119 lb 11.4 oz)    Exam:   General:  thin built female lying  in bed fatigued,  HEENT: dry mucosa, pallor+, supple neck, NG tube+  Cardiovascular: NS1&S2, no murmurs  Respiratory: clear b/l  Abdomen: laparotomy dressing, colostomy , abd drain in place. Bowel sounds present  Ext: warm, no edema   CNS: Fatigued, alert  and oriented    Data Reviewed: Basic Metabolic Panel:  Recent Labs Lab 08/30/14 0600 08/31/14 0600 08/31/14 2022 09/01/14 0545 09/02/14 0540 09/03/14 0500  NA 147* 143 139 141 139 136  K 3.2* 2.9* 3.4* 3.7 3.0* 3.3*  CL 110 103 108 108 103 100*  CO2 22 28 25 28  32 30  GLUCOSE 98 166* 248* 180* 128* 141*  BUN 39* 41* 29* 26* 20 20  CREATININE 0.63 0.46 0.74 0.51 0.35* 0.31*  CALCIUM 9.2 9.4 7.5* 8.0* 7.8* 7.5*  MG 2.2 2.3  --  1.4* 1.7 1.6*  PHOS 2.0* 3.0  --  2.7 2.4* 2.2*  Liver Function Tests:  Recent Labs Lab 08/28/14 1735 08/30/14 0600 08/31/14 0600  AST 22 26 22   ALT 19 16 16   ALKPHOS 75 84 78  BILITOT 0.9 1.0 0.5  PROT 6.6 6.8 7.0  ALBUMIN 3.2* 3.5 3.5   No results for input(s): LIPASE, AMYLASE in the last 168 hours. No results for input(s): AMMONIA in the last 168 hours. CBC:  Recent Labs Lab 08/30/14 0600 08/31/14 2022 09/01/14 0545 09/02/14 0800 09/03/14 1000  WBC 4.7 14.0* 17.8* 23.8* 23.0*  NEUTROABS 1.3*  --   --   --   --   HGB 10.4* 6.1* 9.0* 8.1* 7.2*   HCT 33.0* 19.5* 26.7* 25.0* 22.4*  MCV 83.3 85.5 83.7 86.5 87.8  PLT 146* 99* 91* 77* 64*   Cardiac Enzymes:  Recent Labs Lab 08/28/14 1735 08/28/14 2255 08/29/14 0455  TROPONINI <0.03 0.04* <0.03   BNP (last 3 results) No results for input(s): BNP in the last 8760 hours.  ProBNP (last 3 results) No results for input(s): PROBNP in the last 8760 hours.  CBG:  Recent Labs Lab 09/02/14 1710 09/02/14 2000 09/03/14 0355 09/03/14 0804 09/03/14 1217  GLUCAP 120* 147* 152* 143* 136*    Recent Results (from the past 240 hour(s))  Culture, blood (routine x 2)     Status: None   Collection Time: 08/26/14  7:05 AM  Result Value Ref Range Status   Specimen Description BLOOD RIGHT ARM  Final   Special Requests   Final    BOTTLES DRAWN AEROBIC AND ANAEROBIC 10CC BOTH BOTTLES   Culture   Final    NO GROWTH 5 DAYS Performed at [facility]    Report Status 09/01/2014 FINAL  Final  Culture, blood (routine x 2)     Status: None   Collection Time: 08/26/14  7:10 AM  Result Value Ref Range Status   Specimen Description BLOOD LEFT ARM  Final   Special Requests   Final    BOTTLES DRAWN AEROBIC AND ANAEROBIC 10CC BOTH BOTTLES   Culture   Final    NO GROWTH 5 DAYS Performed at [facility]    Report Status 09/01/2014 FINAL  Final  Surgical pcr screen     Status: None   Collection Time: 08/30/14 10:08 PM  Result Value Ref Range Status   MRSA, PCR NEGATIVE NEGATIVE Final   Staphylococcus aureus NEGATIVE NEGATIVE Final    Comment:        The Xpert SA Assay (FDA approved for NASAL specimens in patients over 51 years of age), is one component of a comprehensive surveillance program.  Test performance has been validated by [facility] for patients greater than or equal to 44 year old. It is not intended to diagnose infection nor to guide or monitor treatment.      Studies: Dg Chest Port 1 View  09/02/2014   CLINICAL DATA:  Generalized body aches.   Weakness.  Leukocytosis.  EXAM: PORTABLE CHEST - 1 VIEW  COMPARISON:  08/31/2014  FINDINGS: Support lines and tubes in appropriate position. Heart size is normal. Both lungs remain clear. No evidence of pleural effusion or pneumothorax.  IMPRESSION: No active disease.   Electronically Signed   By: Earle Gell M.D.   On: 09/02/2014 17:31    Scheduled Meds: . antiseptic oral rinse  7 mL Mouth Rinse q12n4p  . calcium gluconate  1 g Intravenous Once  . chlorhexidine  15 mL Mouth Rinse BID  . HYDROmorphone PCA 0.3 mg/mL  Intravenous 6 times per day  . imipenem-cilastatin  250 mg Intravenous 4 times per day  . insulin aspart  0-15 Units Subcutaneous 6 times per day  . nicotine  21 mg Transdermal Daily  . pantoprazole (PROTONIX) IV  40 mg Intravenous QHS  . potassium phosphate IVPB (mmol)  20 mmol Intravenous Once  . scopolamine  1 patch Transdermal Q72H  . sodium chloride  10-40 mL Intracatheter Q12H  . sodium chloride  3 mL Intravenous Q12H   Continuous Infusions: . Marland KitchenTPN (CLINIMIX-E) Adult 60 mL/hr at 09/02/14 1758  . Marland KitchenTPN (CLINIMIX-E) Adult    . sodium chloride 75 mL/hr (09/01/14 1044)  . bupivacaine 0.25 % ON-Q pump DUAL CATH 300 mL        Time spent: 25 minutes    Avital Dancy  [facility] Pager [phone] If 7PM-7AM, please contact night-coverage at [email or web address], password [facility] 09/03/2014, 3:05 PM  LOS: 9 days

## 2014-09-03 NOTE — Progress Notes (Signed)
PARENTERAL NUTRITION CONSULT NOTE - Follow-up  Pharmacy Consult for TPN Indication: Persistent SBO  Allergies  Allergen Reactions  . Avelox [Moxifloxacin Hcl In Nacl] Hives  . Codeine Hives  . Dextromethorphan Hives  . Erythromycin Hives  . Penicillins Hives    No problem with Imipenem  . Suprep [Na Sulfate-K Sulfate-Mg Sulf] Nausea And Vomiting    Patient Measurements: Height: 5\' 7"  (170.2 cm) Weight: 119 lb 11.4 oz (54.3 kg) IBW/kg (Calculated) : 61.6 Adjusted Body Weight: 44.1 kg Usual Weight: 50.4 kg  Vital Signs: Temp: 99.2 F (37.3 C) (06/05 0400) Temp Source: Oral (06/05 0400) BP: 132/59 mmHg (06/05 0400) Pulse Rate: 86 (06/05 0400) Intake/Output from previous day: 06/04 0701 - 06/05 0700 In: 1191 [I.V.:1200; NG/GT:60; IV Piggyback:818; TPN:966] Out: 2450 [Urine:1620; Emesis/NG output:800; Drains:30]  Labs:  Recent Labs  08/31/14 2022 09/01/14 0545 09/02/14 0800  WBC 14.0* 17.8* 23.8*  HGB 6.1* 9.0* 8.1*  HCT 19.5* 26.7* 25.0*  PLT 99* 91* 77*     Recent Labs  09/01/14 0545 09/02/14 0540 09/03/14 0500  NA 141 139 136  K 3.7 3.0* 3.3*  CL 108 103 100*  CO2 28 32 30  GLUCOSE 180* 128* 141*  BUN 26* 20 20  CREATININE 0.51 0.35* 0.31*  CALCIUM 8.0* 7.8* 7.5*  MG 1.4* 1.7 1.6*  PHOS 2.7 2.4* 2.2*   Estimated Creatinine Clearance: 66.5 mL/min (by C-G formula based on Cr of 0.31).    Recent Labs  09/02/14 0821 09/02/14 1710 09/02/14 2000  GLUCAP 146* 120* 147*   Medications, infusions:  . Marland KitchenTPN (CLINIMIX-E) Adult 60 mL/hr at 09/02/14 1758  . sodium chloride 75 mL/hr (09/01/14 1044)  . bupivacaine 0.25 % ON-Q pump DUAL CATH 300 mL     Insulin Requirements: Moderate SSI q4h: 8 units / 24 hours.  Current Nutrition: NPO  IVF: NS at 75 ml/hr  Central access: CVC port L chest TPN start date: 6/1  ASSESSMENT                                                                                                          HPI: [id number] with PMH  metastatic rectal cancer and rectal stent 06/2014, s/p 3 cycles chemo presents 5/27 with N/V x 1 day.  Found to have SBO, likely secondary to pelvic tumor on CT.  NG tube placed and later removed once SBO improved on Abd XR.  Started on CL diet, but persistent N/V prevent meaningful PO intake.  To start on TPN per pharmacy for nutritional supplementation until tolerating PO.  Significant events:  5/31: patient misunderstood Surgeon to imply that TPN was not going to start today and did not allow TPN to be initiated. 6/2: s/p exp laparotomy, SB resection, LAR, stent, colostomy, appendectomy, hysterectomy, salpingo-oophorectomy, transverse colon resection, debulking of peritoneum.  Today:   Glucose (goal <150): No Hx DM.  CBGs at goal with range 120-147 on SSI.    Electrolytes: Na WNL.  K, Mag, Phos, and Corr Ca 7.9 are low (despite Mag, K, Phos replacement 6/4, Ca 6/2)  Renal: SCr low  LFTs: wnl (6/2)  TGs: elevated at baseline with 226 (6/1)  Prealbumin: 13.2 (6/1)  NUTRITIONAL GOALS                                                                                             RD recs: Nutrition needs: 1600-1800 kcal, 70-80 grams protein.  Per new ASPEN and SCCM guidelines, HOLD lipids during the first seven days of therapy while pt meets ICU status. Goal will be to meet 100% of the patient's protein needs and approximately 80% of their caloric needs.  Clinimix E 5/20 @ 60 mL/hr to provide 72 grams protein (100% of goal) and 1267 kcal (79% of goal)   Glucose infusion rate will be 3.68 mg/kg/min (Maximum 4 mg/kg/min)  PLAN                                                                                                                         Now:  KCl 26mEq IV x2 runs  Magnesium 2g IV bolus x1  K Phos 20 mmol IV x1 (also provides additional 30 mEq of potassium)  CaGluconate 1g IV bolus x1  At 1800 today:  Continue Clinimix E 5/20 IV at 60 ml/hr.  Hold IV lipids for first 7  days while in ICU per protocol (6/3-6/9)  TPN to contain standard multivitamins and trace elements.  IVF per MD orders, NS at 75 ml/hr.    Continue CBGs and moderate SSI q4h   TPN lab panels on Mondays & Thursdays.   Gretta Arab PharmD, BCPS Pager [phone] 09/03/2014 8:00 AM

## 2014-09-03 NOTE — Progress Notes (Signed)
3cc Dilaudid PCA wasted in sink with Ginger Epling, RN.   Dorrene German, RN

## 2014-09-04 ENCOUNTER — Inpatient Hospital Stay (HOSPITAL_COMMUNITY): Payer: 59

## 2014-09-04 DIAGNOSIS — C799 Secondary malignant neoplasm of unspecified site: Secondary | ICD-10-CM

## 2014-09-04 DIAGNOSIS — Z933 Colostomy status: Secondary | ICD-10-CM

## 2014-09-04 DIAGNOSIS — R509 Fever, unspecified: Secondary | ICD-10-CM

## 2014-09-04 DIAGNOSIS — K631 Perforation of intestine (nontraumatic): Secondary | ICD-10-CM | POA: Diagnosis present

## 2014-09-04 DIAGNOSIS — Z113 Encounter for screening for infections with a predominantly sexual mode of transmission: Secondary | ICD-10-CM | POA: Insufficient documentation

## 2014-09-04 DIAGNOSIS — Z7189 Other specified counseling: Secondary | ICD-10-CM | POA: Insufficient documentation

## 2014-09-04 DIAGNOSIS — I4892 Unspecified atrial flutter: Secondary | ICD-10-CM

## 2014-09-04 DIAGNOSIS — L8991 Pressure ulcer of unspecified site, stage 1: Secondary | ICD-10-CM | POA: Insufficient documentation

## 2014-09-04 DIAGNOSIS — Z9889 Other specified postprocedural states: Secondary | ICD-10-CM

## 2014-09-04 DIAGNOSIS — A419 Sepsis, unspecified organism: Secondary | ICD-10-CM

## 2014-09-04 DIAGNOSIS — L8990 Pressure ulcer of unspecified site, unspecified stage: Secondary | ICD-10-CM

## 2014-09-04 LAB — COMPREHENSIVE METABOLIC PANEL
ALBUMIN: 1.7 g/dL — AB (ref 3.5–5.0)
ALT: 12 U/L — AB (ref 14–54)
AST: 22 U/L (ref 15–41)
Alkaline Phosphatase: 78 U/L (ref 38–126)
Anion gap: 7 (ref 5–15)
BILIRUBIN TOTAL: 0.3 mg/dL (ref 0.3–1.2)
BUN: 20 mg/dL (ref 6–20)
CO2: 29 mmol/L (ref 22–32)
Calcium: 7.8 mg/dL — ABNORMAL LOW (ref 8.9–10.3)
Chloride: 99 mmol/L — ABNORMAL LOW (ref 101–111)
Creatinine, Ser: <0.3 mg/dL — ABNORMAL LOW (ref 0.44–1.00)
GLUCOSE: 136 mg/dL — AB (ref 65–99)
Potassium: 3.7 mmol/L (ref 3.5–5.1)
Sodium: 135 mmol/L (ref 135–145)
TOTAL PROTEIN: 4.6 g/dL — AB (ref 6.5–8.1)

## 2014-09-04 LAB — DIFFERENTIAL
Basophils Absolute: 0 10*3/uL (ref 0.0–0.1)
Basophils Relative: 0 % (ref 0–1)
Eosinophils Absolute: 0 10*3/uL (ref 0.0–0.7)
Eosinophils Relative: 0 % (ref 0–5)
Lymphocytes Relative: 6 % — ABNORMAL LOW (ref 12–46)
Lymphs Abs: 1.5 10*3/uL (ref 0.7–4.0)
Monocytes Absolute: 1.8 10*3/uL — ABNORMAL HIGH (ref 0.1–1.0)
Monocytes Relative: 7 % (ref 3–12)
NEUTROS ABS: 21.9 10*3/uL — AB (ref 1.7–7.7)
Neutrophils Relative %: 87 % — ABNORMAL HIGH (ref 43–77)

## 2014-09-04 LAB — CALCIUM, IONIZED: Calcium, Ionized, Serum: 4.5 mg/dL (ref 4.5–5.6)

## 2014-09-04 LAB — CBC
HCT: 22.4 % — ABNORMAL LOW (ref 36.0–46.0)
HEMOGLOBIN: 7.2 g/dL — AB (ref 12.0–15.0)
MCH: 27.5 pg (ref 26.0–34.0)
MCHC: 32.1 g/dL (ref 30.0–36.0)
MCV: 85.5 fL (ref 78.0–100.0)
Platelets: 72 10*3/uL — ABNORMAL LOW (ref 150–400)
RBC: 2.62 MIL/uL — ABNORMAL LOW (ref 3.87–5.11)
RDW: 18.2 % — ABNORMAL HIGH (ref 11.5–15.5)
WBC: 25.2 10*3/uL — AB (ref 4.0–10.5)

## 2014-09-04 LAB — GLUCOSE, CAPILLARY
Glucose-Capillary: 108 mg/dL — ABNORMAL HIGH (ref 65–99)
Glucose-Capillary: 114 mg/dL — ABNORMAL HIGH (ref 65–99)
Glucose-Capillary: 114 mg/dL — ABNORMAL HIGH (ref 65–99)
Glucose-Capillary: 122 mg/dL — ABNORMAL HIGH (ref 65–99)
Glucose-Capillary: 127 mg/dL — ABNORMAL HIGH (ref 65–99)
Glucose-Capillary: 129 mg/dL — ABNORMAL HIGH (ref 65–99)

## 2014-09-04 LAB — MAGNESIUM: Magnesium: 1.5 mg/dL — ABNORMAL LOW (ref 1.7–2.4)

## 2014-09-04 LAB — PREALBUMIN: PREALBUMIN: 6 mg/dL — AB (ref 18–38)

## 2014-09-04 LAB — PHOSPHORUS: PHOSPHORUS: 2.6 mg/dL (ref 2.5–4.6)

## 2014-09-04 MED ORDER — MAGNESIUM SULFATE IN D5W 10-5 MG/ML-% IV SOLN
1.0000 g | Freq: Once | INTRAVENOUS | Status: AC
  Administered 2014-09-04: 1 g via INTRAVENOUS
  Filled 2014-09-04: qty 100

## 2014-09-04 MED ORDER — POTASSIUM PHOSPHATES 15 MMOLE/5ML IV SOLN
10.0000 mmol | Freq: Once | INTRAVENOUS | Status: AC
  Administered 2014-09-04: 10 mmol via INTRAVENOUS
  Filled 2014-09-04: qty 3.33

## 2014-09-04 MED ORDER — M.V.I. ADULT IV INJ
INJECTION | INTRAVENOUS | Status: AC
  Administered 2014-09-04: 18:00:00 via INTRAVENOUS
  Filled 2014-09-04: qty 1440

## 2014-09-04 NOTE — Progress Notes (Signed)
Nutrition Follow-up  DOCUMENTATION CODES:  Severe malnutrition in context of chronic illness, Underweight  INTERVENTION:  TPN per Pharmacy - Monitor magnesium, potassium, and phosphorus daily for at least 3 days, MD to replete as needed, as pt is at risk for refeeding syndrome given severe malnutrition and poor po intake PTA. - Diet advancement per surgery - RD will continue to monitor  NUTRITION DIAGNOSIS:  Malnutrition related to chronic illness as evidenced by percent weight loss, severe depletion of body fat, severe depletion of muscle mass.  Ongoing.  GOAL:  Patient will meet greater than or equal to 90% of their needs  Meeting with TPN  MONITOR:  Other (Comment), PO intake, Weight trends, Labs, I & O's (TPN)   ASSESSMENT: 59 year old female with past medical history significant for rectal cancer with liver metastasis, rectal stent placement in 06/2014 (under Dr. Gearldine Shown care), status post 3 cycles of chemotherapy, last cycle completed 08/16/2014. She started to develop nausea and vomiting over past 24 hours prior to this admission.  5/29: - Pt followed by [facility], last seen 5/18. - Pt NPO for bowel rest d/t SBO. Pt states that she has not had anything by mouth x 5 days.  - Pt with NGT for suction, output 1000 ml. Recommend nutrition support if bowel rest to last > 7 days.  - Pt at risk for refeeding risk d/t severe malnutrition and poor PO intake PTA. - Pt with 22 lb weight loss since 3/02 (18% weight loss x 3 months, significant for time frame).  6/2 pt s/p PROCEDURE: Procedure(s): EXPLORATORY LAPAROTOMY SMALL BOWEL RESECTION LOW ANTERIOR RECTOSIGMOID RESECTION (including stent) with COLOSTOMY APPENDECTOMY HYSTERECTOMY SUPRACERVICAL ABDOMINAL Salpingo-oophorectomy TRANSVERSE COLON RESECTION WITH PARTIAL OMENTECTOMY DEBULKING OF PERITONEUM  6/3: Moved to SDU post-op. Pt tolerating TPN. Currently running at goal rate of 60 mL/hr providing 1267  kcal (79% of needs) and 72 g protein (100% of needs.)  Labs and medications reviewed  6/6: -Pt continue to tolerate TPN with no issues. -NGT clamped -Wt is +12 lb since 5/27  Labs reviewed: CBGs: 114-131 Low Creatinine, Mg Phos WNL  Plan per Pharmacy 6/6: At 1800 today:  Continue Clinimix E 5/20 IV at 60 ml/hr.  Hold IV lipids for first 7 days while in ICU per protocol (6/3-6/9)  Height:  Ht Readings from Last 1 Encounters:  08/25/14 5\' 7"  (1.702 m)    Weight:  Wt Readings from Last 1 Encounters:  09/04/14 122 lb 2.2 oz (55.4 kg)    Ideal Body Weight:  61.3 kg  Wt Readings from Last 10 Encounters:  09/04/14 122 lb 2.2 oz (55.4 kg)  08/25/14 110 lb (49.896 kg)  08/24/14 109 lb 12.8 oz (49.805 kg)  08/24/14 110 lb (49.896 kg)  08/16/14 110 lb 11.2 oz (50.213 kg)  08/02/14 113 lb (51.256 kg)  07/23/14 114 lb 5 oz (51.852 kg)  07/14/14 118 lb 14.4 oz (53.933 kg)  06/29/14 120 lb (54.432 kg)  05/31/14 125 lb (56.7 kg)    BMI:  Body mass index is 19.12 kg/(m^2).  Estimated Nutritional Needs:  Kcal:  1600-1800  Protein:  70-80g  Fluid:  1.6L/day    Skin:  Wound (see comment) (Stage II sacral ulcer)  Diet Order:  Diet NPO time specified Except for: Ice Chips .TPN (CLINIMIX-E) Adult .TPN (CLINIMIX-E) Adult  EDUCATION NEEDS:  No education needs identified at this time   Intake/Output Summary (Last 24 hours) at 09/04/14 1024 Last data filed at 09/04/14 0600  Gross per  24 hour  Intake   3989 ml  Output   2445 ml  Net   1544 ml    Last BM:  5/31  Clayton Bibles, MS, RD, LDN Pager: [phone] After Hours Pager: [phone]

## 2014-09-04 NOTE — Consult Note (Signed)
[facility]    Date of Admission:  08/25/2014  Date of Consult:  09/04/2014  Reason for Consult: fever, leukocytosis in patient with metastatic rectal cancer with SBO with perforation requiring surgery Referring Physician: Dr. Clementeen Graham   HPI:  Sydney Morrison is an 59 y.o. female  With recently diagnosed rectal cancer with metastases sp rectal stent in April, sp  3 cycles of chemotherapy who developed SBO  With perforration and required ex-laparotomy on 08/31/14 with rectosigmoid resection with diverting colostomy, appendectomy, TAH, transverse colon resection, partial omentectomy, peritoneal debulking 08/31/14. Her postop course has been complicated by persistent ileus, leukocytosis and fever to above 101 yesterday.   She is currently on imipenem.  Past Medical History  Diagnosis Date  . Crohn's disease   . Chronic headache   . Cancer   . Nausea and vomiting 08/25/2014    Past Surgical History  Procedure Laterality Date  . Breast surgery    . Uterine fibroid surgery    . Colonoscopy w/ biopsies  06/29/2014    DR HUNG  . Flexible sigmoidoscopy N/A 06/30/2014    Procedure: FLEXIBLE SIGMOIDOSCOPY;  Surgeon: Carol Ada, MD;  Location: [facility] ENDOSCOPY;  Service: Endoscopy;  Laterality: N/A;  . Colonic stent placement N/A 06/30/2014    Procedure: COLONIC STENT PLACEMENT;  Surgeon: Carol Ada, MD;  Location: [facility] ENDOSCOPY;  Service: Endoscopy;  Laterality: N/A;  with fluro   . Portacath placement N/A 07/04/2014    Procedure: POWER PORT PLACEMENT;  Surgeon: Alphonsa Overall, MD;  Location: [place];  Service: General;  Laterality: N/A;  . Laparotomy N/A 08/31/2014    Procedure: EXPLORATORY LAPAROTOMY;  Surgeon: Michael Boston, MD;  Location: [facility];  Service: General;  Laterality: N/A;  . Bowel resection N/A 08/31/2014    Procedure: SMALL BOWEL RESECTION;  Surgeon: Michael Boston, MD;  Location: [facility];  Service: General;  Laterality: N/A;  . Colostomy N/A 08/31/2014   Procedure: low anterior resection with COLOSTOMY;  Surgeon: Michael Boston, MD;  Location: [facility];  Service: General;  Laterality: N/A;  . Appendectomy  08/31/2014    Procedure: APPENDECTOMY;  Surgeon: Michael Boston, MD;  Location: [facility];  Service: General;;  . Supracervical abdominal hysterectomy  08/31/2014    Procedure: HYSTERECTOMY SUPRACERVICAL ABDOMINAL BILATERAL TUBES AND OVARIES;  Surgeon: Michael Boston, MD;  Location: [facility];  Service: General;;  . Transverse colon resection  08/31/2014    Procedure: TRANSVERSE COLON RESECTION;  Surgeon: Michael Boston, MD;  Location: [facility];  Service: General;;  . Debulking  08/31/2014    Procedure: DEBULKING OF PERITONEUM;  Surgeon: Michael Boston, MD;  Location: [facility];  Service: General;;  ergies:   Allergies  Allergen Reactions  . Avelox [Moxifloxacin Hcl In Nacl] Hives  . Codeine Hives  . Dextromethorphan Hives  . Erythromycin Hives  . Penicillins Hives    No problem with Imipenem  . Suprep [Na Sulfate-K Sulfate-Mg Sulf] Nausea And Vomiting     Medications: I have reviewed patients current medications as documented in Epic Anti-infectives    Start     Dose/Rate Route Frequency Ordered Stop   09/01/14 1200  imipenem-cilastatin (PRIMAXIN) 250 mg in sodium chloride 0.9 % 100 mL IVPB     250 mg 200 mL/hr over 30 Minutes Intravenous 4 times per day 09/01/14 1051     08/31/14 1030  clindamycin (CLEOCIN) 900 mg, gentamicin (GARAMYCIN) 240 mg in sodium chloride 0.9 % 1,000 mL for intraperitoneal  lavage  Status:  Discontinued      Intraperitoneal To Surgery 08/31/14 1023 08/31/14 1513   08/31/14 0600  clindamycin (CLEOCIN) IVPB 900 mg     900 mg 100 mL/hr over 30 Minutes Intravenous 60 min pre-op 08/30/14 1218 08/31/14 0944   08/31/14 0600  [MAR Hold]  gentamicin (GARAMYCIN) 220 mg in dextrose 5 % 100 mL IVPB     (MAR Hold since 08/31/14 1010)   5 mg/kg  44.1 kg 105.5 mL/hr over 60 Minutes Intravenous 60 min pre-op 08/30/14 1218 08/31/14 1005   08/30/14  2100  imipenem-cilastatin (PRIMAXIN) 250 mg in sodium chloride 0.9 % 100 mL IVPB  Status:  Discontinued     250 mg 200 mL/hr over 30 Minutes Intravenous 3 times per day 08/30/14 1739 09/01/14 1051   08/25/14 1800  imipenem-cilastatin (PRIMAXIN) 250 mg in sodium chloride 0.9 % 100 mL IVPB  Status:  Discontinued     250 mg 200 mL/hr over 30 Minutes Intravenous Every 6 hours 08/25/14 1716 08/30/14 1739   08/25/14 1800  metroNIDAZOLE (FLAGYL) IVPB 500 mg  Status:  Discontinued     500 mg 100 mL/hr over 60 Minutes Intravenous Every 8 hours 08/25/14 1716 08/27/14 1003      Social History:  reports that she quit smoking about 2 months ago. Her smoking use included Cigarettes. She has a 20 pack-year smoking history. She has never used smokeless tobacco. She reports that she does not drink alcohol or use illicit drugs.  Family History  Problem Relation Age of Onset  . Hypertension Mother   . Cancer Father     As in HPI and primary teams notes otherwise 12 point review of systems is negative  Blood pressure 132/56, pulse 87, temperature 99.7 F (37.6 C), temperature source Oral, resp. rate 23, height 5' 7"  (1.702 m), weight 122 lb 2.2 oz (55.4 kg), SpO2 93 %. General: Alert and awake, oriented x3, fatigued, dysphoric but pleasant HEENT: anicteric sclera, , EOMI, oropharynx clear and without exudate CVS regular rate, normal r,  no murmur rubs or gallops Chest: clear to auscultation bilaterally, no wheezing, rales or rhonchi Abdomen: diffusely tender, ostomy pink with no stool in bag. +bs Extremities: no  clubbing or edema noted bilaterally Skin: portacath clean, central line 3 lumen clean, incision site is packed but no erythema in surrounding skin Neuro: nonfocal, strength and sensation intact   Results for orders placed or performed during the hospital encounter of 08/25/14 (from the past 48 hour(s))  Glucose, capillary     Status: Abnormal   Collection Time: 09/02/14  8:00 PM  Result  Value Ref Range   Glucose-Capillary 147 (H) 65 - 99 mg/dL  Glucose, capillary     Status: Abnormal   Collection Time: 09/03/14  3:55 AM  Result Value Ref Range   Glucose-Capillary 152 (H) 65 - 99 mg/dL   Comment 1 Notify RN    Comment 2 Document in Chart   Basic metabolic panel     Status: Abnormal   Collection Time: 09/03/14  5:00 AM  Result Value Ref Range   Sodium 136 135 - 145 mmol/L   Potassium 3.3 (L) 3.5 - 5.1 mmol/L   Chloride 100 (L) 101 - 111 mmol/L   CO2 30 22 - 32 mmol/L   Glucose, Bld 141 (H) 65 - 99 mg/dL   BUN 20 6 - 20 mg/dL   Creatinine, Ser 0.31 (L) 0.44 - 1.00 mg/dL   Calcium 7.5 (L) 8.9 - 10.3 mg/dL  GFR calc non Af Amer >60 >60 mL/min   GFR calc Af Amer >60 >60 mL/min    Comment: (NOTE) The eGFR has been calculated using the CKD EPI equation. This calculation has not been validated in all clinical situations. eGFR's persistently <60 mL/min signify possible Chronic Kidney Disease.    Anion gap 6 5 - 15  Magnesium     Status: Abnormal   Collection Time: 09/03/14  5:00 AM  Result Value Ref Range   Magnesium 1.6 (L) 1.7 - 2.4 mg/dL  Phosphorus     Status: Abnormal   Collection Time: 09/03/14  5:00 AM  Result Value Ref Range   Phosphorus 2.2 (L) 2.5 - 4.6 mg/dL  Glucose, capillary     Status: Abnormal   Collection Time: 09/03/14  8:04 AM  Result Value Ref Range   Glucose-Capillary 143 (H) 65 - 99 mg/dL   Comment 1 Notify RN    Comment 2 Document in Chart   CBC     Status: Abnormal   Collection Time: 09/03/14 10:00 AM  Result Value Ref Range   WBC 23.0 (H) 4.0 - 10.5 K/uL   RBC 2.55 (L) 3.87 - 5.11 MIL/uL   Hemoglobin 7.2 (L) 12.0 - 15.0 g/dL   HCT 22.4 (L) 36.0 - 46.0 %   MCV 87.8 78.0 - 100.0 fL   MCH 28.2 26.0 - 34.0 pg   MCHC 32.1 30.0 - 36.0 g/dL   RDW 17.9 (H) 11.5 - 15.5 %   Platelets 64 (L) 150 - 400 K/uL    Comment: SPECIMEN CHECKED FOR CLOTS REPEATED TO VERIFY CONSISTENT WITH PREVIOUS RESULT   Glucose, capillary     Status: Abnormal     Collection Time: 09/03/14 12:17 PM  Result Value Ref Range   Glucose-Capillary 136 (H) 65 - 99 mg/dL   Comment 1 Notify RN    Comment 2 Document in Chart   Urinalysis, Routine w reflex microscopic (not at [facility])     Status: Abnormal   Collection Time: 09/03/14  4:49 PM  Result Value Ref Range   Color, Urine YELLOW YELLOW   APPearance CLOUDY (A) CLEAR   Specific Gravity, Urine 1.017 1.005 - 1.030   pH 6.0 5.0 - 8.0   Glucose, UA 250 (A) NEGATIVE mg/dL   Hgb urine dipstick LARGE (A) NEGATIVE   Bilirubin Urine NEGATIVE NEGATIVE   Ketones, ur NEGATIVE NEGATIVE mg/dL   Protein, ur 30 (A) NEGATIVE mg/dL   Urobilinogen, UA 0.2 0.0 - 1.0 mg/dL   Nitrite NEGATIVE NEGATIVE   Leukocytes, UA NEGATIVE NEGATIVE  Urine microscopic-add on     Status: Abnormal   Collection Time: 09/03/14  4:49 PM  Result Value Ref Range   Squamous Epithelial / LPF FEW (A) RARE   WBC, UA 3-6 <3 WBC/hpf   RBC / HPF 21-50 <3 RBC/hpf   Bacteria, UA FEW (A) RARE   Urine-Other MUCOUS PRESENT   Glucose, capillary     Status: Abnormal   Collection Time: 09/03/14  5:04 PM  Result Value Ref Range   Glucose-Capillary 160 (H) 65 - 99 mg/dL   Comment 1 Notify RN    Comment 2 Document in Chart   Glucose, capillary     Status: Abnormal   Collection Time: 09/03/14  8:19 PM  Result Value Ref Range   Glucose-Capillary 131 (H) 65 - 99 mg/dL   Comment 1 Notify RN    Comment 2 Document in Chart   Glucose, capillary     Status:  Abnormal   Collection Time: 09/03/14 11:47 PM  Result Value Ref Range   Glucose-Capillary 122 (H) 65 - 99 mg/dL  Glucose, capillary     Status: Abnormal   Collection Time: 09/04/14  4:45 AM  Result Value Ref Range   Glucose-Capillary 114 (H) 65 - 99 mg/dL   Comment 1 Notify RN    Comment 2 Document in Chart   Comprehensive metabolic panel     Status: Abnormal   Collection Time: 09/04/14  5:00 AM  Result Value Ref Range   Sodium 135 135 - 145 mmol/L   Potassium 3.7 3.5 - 5.1 mmol/L   Chloride  99 (L) 101 - 111 mmol/L   CO2 29 22 - 32 mmol/L   Glucose, Bld 136 (H) 65 - 99 mg/dL   BUN 20 6 - 20 mg/dL   Creatinine, Ser <0.30 (L) 0.44 - 1.00 mg/dL   Calcium 7.8 (L) 8.9 - 10.3 mg/dL   Total Protein 4.6 (L) 6.5 - 8.1 g/dL   Albumin 1.7 (L) 3.5 - 5.0 g/dL   AST 22 15 - 41 U/L   ALT 12 (L) 14 - 54 U/L   Alkaline Phosphatase 78 38 - 126 U/L   Total Bilirubin 0.3 0.3 - 1.2 mg/dL   GFR calc non Af Amer NOT CALCULATED >60 mL/min   GFR calc Af Amer NOT CALCULATED >60 mL/min    Comment: (NOTE) The eGFR has been calculated using the CKD EPI equation. This calculation has not been validated in all clinical situations. eGFR's persistently <60 mL/min signify possible Chronic Kidney Disease.    Anion gap 7 5 - 15  Magnesium     Status: Abnormal   Collection Time: 09/04/14  5:00 AM  Result Value Ref Range   Magnesium 1.5 (L) 1.7 - 2.4 mg/dL  Phosphorus     Status: None   Collection Time: 09/04/14  5:00 AM  Result Value Ref Range   Phosphorus 2.6 2.5 - 4.6 mg/dL  CBC     Status: Abnormal   Collection Time: 09/04/14  5:00 AM  Result Value Ref Range   WBC 25.2 (H) 4.0 - 10.5 K/uL   RBC 2.62 (L) 3.87 - 5.11 MIL/uL   Hemoglobin 7.2 (L) 12.0 - 15.0 g/dL   HCT 22.4 (L) 36.0 - 46.0 %   MCV 85.5 78.0 - 100.0 fL   MCH 27.5 26.0 - 34.0 pg   MCHC 32.1 30.0 - 36.0 g/dL   RDW 18.2 (H) 11.5 - 15.5 %   Platelets 72 (L) 150 - 400 K/uL    Comment: CONSISTENT WITH PREVIOUS RESULT  Differential     Status: Abnormal   Collection Time: 09/04/14  5:00 AM  Result Value Ref Range   Neutrophils Relative % 87 (H) 43 - 77 %   Lymphocytes Relative 6 (L) 12 - 46 %   Monocytes Relative 7 3 - 12 %   Eosinophils Relative 0 0 - 5 %   Basophils Relative 0 0 - 1 %   Neutro Abs 21.9 (H) 1.7 - 7.7 K/uL   Lymphs Abs 1.5 0.7 - 4.0 K/uL   Monocytes Absolute 1.8 (H) 0.1 - 1.0 K/uL   Eosinophils Absolute 0.0 0.0 - 0.7 K/uL   Basophils Absolute 0.0 0.0 - 0.1 K/uL   WBC Morphology MILD LEFT SHIFT (1-5% METAS, OCC  MYELO, OCC BANDS)   Prealbumin     Status: Abnormal   Collection Time: 09/04/14  5:00 AM  Result Value Ref Range  Prealbumin 6.0 (L) 18 - 38 mg/dL    Comment: Performed at [facility]  Glucose, capillary     Status: Abnormal   Collection Time: 09/04/14  8:22 AM  Result Value Ref Range   Glucose-Capillary 114 (H) 65 - 99 mg/dL  Glucose, capillary     Status: Abnormal   Collection Time: 09/04/14 12:10 PM  Result Value Ref Range   Glucose-Capillary 129 (H) 65 - 99 mg/dL  Glucose, capillary     Status: Abnormal   Collection Time: 09/04/14  4:52 PM  Result Value Ref Range   Glucose-Capillary 127 (H) 65 - 99 mg/dL   Comment 1 Notify RN    Comment 2 Document in Chart    @BRIEFLABTABLE (sdes,specrequest,cult,reptstatus)   ) Recent Results (from the past 720 hour(s))  Culture, blood (routine x 2)     Status: None   Collection Time: 08/26/14  7:05 AM  Result Value Ref Range Status   Specimen Description BLOOD RIGHT ARM  Final   Special Requests   Final    BOTTLES DRAWN AEROBIC AND ANAEROBIC 10CC BOTH BOTTLES   Culture   Final    NO GROWTH 5 DAYS Performed at [facility]    Report Status 09/01/2014 FINAL  Final  Culture, blood (routine x 2)     Status: None   Collection Time: 08/26/14  7:10 AM  Result Value Ref Range Status   Specimen Description BLOOD LEFT ARM  Final   Special Requests   Final    BOTTLES DRAWN AEROBIC AND ANAEROBIC 10CC BOTH BOTTLES   Culture   Final    NO GROWTH 5 DAYS Performed at [facility]    Report Status 09/01/2014 FINAL  Final  Surgical pcr screen     Status: None   Collection Time: 08/30/14 10:08 PM  Result Value Ref Range Status   MRSA, PCR NEGATIVE NEGATIVE Final   Staphylococcus aureus NEGATIVE NEGATIVE Final    Comment:        The Xpert SA Assay (FDA approved for NASAL specimens in patients over 30 years of age), is one component of a comprehensive surveillance program.  Test performance has been validated by  [facility] for patients greater than or equal to 34 year old. It is not intended to diagnose infection nor to guide or monitor treatment.      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 female with   ergies:   Allergies  Allergen Reactions  . Avelox [Moxifloxacin Hcl In Nacl] Hives  . Codeine Hives  . Dextromethorphan Hives  . Erythromycin Hives  . Penicillins Hives    No problem with Imipenem  . Suprep [Na Sulfate-K Sulfate-Mg Sulf] Nausea And Vomiting     Medications: I have reviewed patients current medications as documented in Epic Anti-infectives    Start     Dose/Rate Route Frequency Ordered Stop   09/01/14 1200  imipenem-cilastatin (PRIMAXIN) 250 mg in sodium chloride 0.9 % 100 mL IVPB     250 mg 200 mL/hr over 30 Minutes Intravenous 4 times per day 09/01/14 1051     08/31/14 1030  clindamycin (CLEOCIN) 900 mg, gentamicin (GARAMYCIN) 240 mg in sodium chloride 0.9 % 1,000 mL for intraperitoneal lavage  Status:  Discontinued      Intraperitoneal To Surgery 08/31/14 1023 08/31/14 1513   08/31/14 0600  clindamycin (CLEOCIN) IVPB 900 mg     900 mg 100 mL/hr over 30 Minutes Intravenous 60 min pre-op 08/30/14 1218 08/31/14 0944   08/31/14 0600  [MAR Hold]  gentamicin (GARAMYCIN) 220 mg in dextrose 5 % 100 mL IVPB     (MAR Hold since 08/31/14 1010)   5 mg/kg  44.1 kg 105.5 mL/hr over 60 Minutes Intravenous 60 min pre-op 08/30/14 1218 08/31/14 1005   08/30/14 2100  imipenem-cilastatin (PRIMAXIN) 250 mg in sodium chloride 0.9 % 100 mL IVPB  Status:  Discontinued     250 mg 200 mL/hr over 30 Minutes Intravenous 3 times per day 08/30/14 1739 09/01/14 1051   08/25/14 1800  imipenem-cilastatin (PRIMAXIN) 250 mg in sodium chloride 0.9 % 100 mL  IVPB  Status:  Discontinued     250 mg 200 mL/hr over 30 Minutes Intravenous Every 6 hours 08/25/14 1716 08/30/14 1739   08/25/14 1800  metroNIDAZOLE (FLAGYL) IVPB 500 mg  Status:  Discontinued     500 mg 100 mL/hr over 60 Minutes Intravenous Every 8 hours 08/25/14 1716 08/27/14 1003      Social History:  reports that she quit smoking about 2 months ago. Her smoking use included Cigarettes. She has a 20 pack-year smoking history. She has never used smokeless tobacco. She reports that she does not drink alcohol or use illicit drugs.  Family History  Problem Relation Age of Onset  . Hypertension Mother   . Cancer Father     As in HPI and primary teams notes otherwise 12 point review of systems is negative  Blood pressure 132/56, pulse 87, temperature 99.7 F (37.6 C), temperature source Oral, resp. rate 23, height 5' 7"  (1.702 m), weight 122 lb 2.2 oz (55.4 kg), SpO2 93 %. General: Alert and awake, oriented x3, not in any acute distress. HEENT: anicteric sclera, pupils reactive to light and accommodation, EOMI, oropharynx clear and without exudate CVS regular rate, normal r,  no murmur rubs or gallops Chest: clear to auscultation bilaterally, no wheezing, rales or rhonchi Abdomen: soft nontender, nondistended, normal bowel sounds, Extremities: no  clubbing or edema noted bilaterally Skin: no rashes Neuro: nonfocal, strength and sensation intact     )  Sydney Morrison is a 59 y.o. female with  rectal cancer with metastases sp rectal stent in April, sp  3 cycles of chemotherapy who developed SBO  With perforration and required ex-laparotomy on 08/31/14 with rectosigmoid resection with diverting colostomy, appendectomy, TAH, transverse colon resection, partial omentectomy, peritoneal debulking 08/31/14. Her postop course has been complicated by persistent ileus, leukocytosis and fever  #1 Postoperative leukocytosis and fevers in patient with recent SBO with perforation  Given her recent  history if she is still failing to resolve fevers, leukocytosis she needs a repeat CT scan of the abdomen and pelvis with IV and oral contrast to ensure no intrabdominal abscesses have formed postop  She is VERY broadly covered with IMIPENEM  She has NO evidence of clear cut skin infection or portacath or line infection and I see no indication to broaden her to cover MRSA or AMP R enterococcus at this point  #2 Goals of care: Patient is worn down from recent course and is emphatic that she is not yet ready to start chemotherapy next week. Would defer to Dr Benay Spice. Patient may  benefit from inpatient  psychosocial counseling, support. She spent several years  prior to discovery of her own malignancy caring for her husband whose own diagnosis was difficult to finally make  #3 Early decubitus ulcer: Per pt and husband pt has early dcbubitus ulcer: Will examine more closely tomorrow  #4 Screening: will check HIV     09/04/2014, 7:45 PM   Thank you so much for this interesting consult  [place] [facility] [place] [phone] (pager) [phone] (office) 09/04/2014, 7:45 PM  [place] 09/04/2014, 7:45 PM

## 2014-09-04 NOTE — Progress Notes (Signed)
PARENTERAL NUTRITION CONSULT NOTE - Follow-up  Pharmacy Consult for TPN Indication: Persistent SBO  Allergies  Allergen Reactions  . Avelox [Moxifloxacin Hcl In Nacl] Hives  . Codeine Hives  . Dextromethorphan Hives  . Erythromycin Hives  . Penicillins Hives    No problem with Imipenem  . Suprep [Na Sulfate-K Sulfate-Mg Sulf] Nausea And Vomiting    Patient Measurements: Height: _0  (170.2 cm) Weight: 122 lb 2.2 oz (55.4 kg) IBW/kg (Calculated) : 61.6 Adjusted Body Weight: 44.1 kg Usual Weight: 50.4 kg  Vital Signs: Temp: 99.9 F (37.7 C) (06/06 0400) Temp Source: Oral (06/06 0400) BP: 132/55 mmHg (06/06 0600) Pulse Rate: 94 (06/06 0600) Intake/Output from previous day: 06/05 0701 - 06/06 0700 In: 4634 [I.V.:1725; IV Piggyback:1260; TPN:1559] Out: 3050 [Urine:2350; Emesis/NG output:600; Drains:65; Stool:35]  Labs:  Recent Labs  09/02/14 0800 09/03/14 1000 09/04/14 0500  WBC 23.8* 23.0* 25.2*  HGB 8.1* 7.2* 7.2*  HCT 25.0* 22.4* 22.4*  PLT 77* 64* 72*     Recent Labs  09/02/14 0540 09/03/14 0500 09/04/14 0500  NA 139 136 135  K 3.0* 3.3* 3.7  CL 103 100* 99*  CO2 32 30 29  GLUCOSE 128* 141* 136*  BUN _1 CREATININE 0.35* 0.31* <0.30*  CALCIUM 7.8* 7.5* 7.8*  MG 1.7 1.6* 1.5*  PHOS 2.4* 2.2* 2.6  PROT  --   --  4.6*  ALBUMIN  --   --  1.7*  AST  --   --  22  ALT  --   --  12*  ALKPHOS  --   --  78  BILITOT  --   --  0.3  PREALBUMIN  --   --  6.0*   CrCl cannot be calculated (Patient has no serum creatinine result on file.).    Recent Labs  09/03/14 2019 09/03/14 2347 09/04/14 0445  GLUCAP 131* 122* 114*   Medications, infusions:  . Marland KitchenTPN (CLINIMIX-E) Adult 60 mL/hr at 09/03/14 1721  . sodium chloride 75 mL/hr (09/01/14 1044)   Insulin Requirements: Moderate SSI q4h: 11 units / 24 hours.  Current Nutrition: NPO  IVF: NS at 75 ml/hr  Central access: CVC port L chest TPN start date: 6/1  ASSESSMENT                                                                                                           HPI: 29yoF with PMH metastatic rectal cancer and rectal stent 06/2014, s/p 3 cycles chemo presents 5/27 with N/V x 1 day.  Found to have SBO, likely secondary to pelvic tumor on CT.  NG tube placed and later removed once SBO improved on Abd XR.  Started on CL diet, but persistent N/V prevent meaningful PO intake.  To start on TPN per pharmacy for nutritional supplementation until tolerating PO.  Significant events:  5/31: patient misunderstood Surgeon to imply that TPN was not going to start today and did not allow TPN to be initiated. 6/2: s/p exp laparotomy, SB resection, LAR, stent, colostomy, appendectomy, hysterectomy, salpingo-oophorectomy, transverse  colon resection, debulking of peritoneum. 6/6: clamp NG tube  Today:   Glucose (goal <150): No Hx DM.  CBGs 114-160 (only one >150 in the past 24 hrs)  Electrolytes: Na WNL. CoCa wnl, phos 2.6, Mag 1.5  Renal: SCr low  LFTs (6/6): AST wnl, ALT low, Alk Phos and Tbili wnl   TGs: elevated at baseline with 226 (6/1)  Prealbumin: 13.2 (6/1)  NUTRITIONAL GOALS                                                                                             RD recs: Nutrition needs: 1600-1800 kcal, 70-80 grams protein.  Per new ASPEN and SCCM guidelines, HOLD lipids during the first seven days of therapy while pt meets ICU status. Goal will be to meet 100% of the patient's protein needs and approximately 80% of their caloric needs.  Clinimix E 5/20 @ 60 mL/hr to provide 72 grams protein (100% of goal) and 1267 kcal (79% of goal)   PLAN                                                                                                                         1) magnesium sulfate 1gm IV x1 2) potassium  Phosphate 10 mmol IV x1 3) At 1800 today:  Continue Clinimix E 5/20 IV at 60 ml/hr.  Hold IV lipids for first 7 days while in ICU per protocol (6/3-6/9)  TPN to  contain standard multivitamins and trace elements.  IVF per MD orders, NS at 75 ml/hr.    Continue CBGs and moderate SSI q4h   TPN lab panels on Mondays & Thursdays.   Dia Sitter, PharmD, BCPS 09/04/2014 8:44 AM

## 2014-09-04 NOTE — Progress Notes (Signed)
Patient Profile: 59 y/o female with rectal cancer + liver metastasis and no prior cardiac history, admitted 08/25/14 for SBO. Cardiology consulted for chest pain and atrial flutter with RVR (in the setting of severe hypokalemia with K of 2.8) POD #1 post Exp. Lap, SB resection, LOW ANTERIOR RECTOSIGMOID RESECTION (including stent) with COLOSTOMY; APPENDECTOMY HYSTERECTOMY SUPRACERVICAL ABDOMINAL Salpingo-oophorectomy; TRANSVERSE COLON RESECTION WITH PARTIAL OMENTECTOMY; DEBULKING OF PERITONEUM.  Hypotensive post op. Transfused. no further chest pain or a flutter    Subjective: No chest pain no complaints  Objective: Vital signs in last 24 hours: Temp:  [98.9 F (37.2 C)-101.5 F (38.6 C)] 99.9 F (37.7 C) (06/06 0400) Pulse Rate:  [82-107] 94 (06/06 0600) Resp:  [15-27] 23 (06/06 0759) BP: (105-146)/(44-61) 132/55 mmHg (06/06 0600) SpO2:  [92 %-99 %] 98 % (06/06 0759) Weight:  [122 lb 2.2 oz (55.4 kg)] 122 lb 2.2 oz (55.4 kg) (06/06 0400) Weight change:  Last BM Date: 08/29/14 Intake/Output from previous day: +1584 06/05 0701 - 06/06 0700 In: 4634 [I.V.:1725; IV Piggyback:1260; TPN:1559] Out: 3050 [Urine:2350; Emesis/NG output:600; Drains:65; Stool:35] Intake/Output this shift:    PE: General:Pleasant affect, NAD Skin:Warm and dry, brisk capillary refill HEENT:normocephalic, sclera clear, mucus membranes moist Heart:S1S2 RRR without murmur, gallup, rub or click Lungs:clear without rales, rhonchi, or wheezes Ext:no lower ext edema, 2+ pedal pulses, 2+ radial pulses, SCDs in place Neuro:alert and oriented X 3, MAE, follows commands, + facial symmetry  TELE:  SR no aflutter   Lab Results:  Recent Labs  09/03/14 1000 09/04/14 0500  WBC 23.0* 25.2*  HGB 7.2* 7.2*  HCT 22.4* 22.4*  PLT 64* 72*   BMET  Recent Labs  09/03/14 0500 09/04/14 0500  NA 136 135  K 3.3* 3.7  CL 100* 99*  CO2 30 29  GLUCOSE 141* 136*  BUN 20 20  CREATININE 0.31* <0.30*    CALCIUM 7.5* 7.8*   No results for input(s): TROPONINI in the last 72 hours.  Invalid input(s): CK, MB  Lab Results  Component Value Date   TRIG 226* 08/30/2014   Lab Results  Component Value Date   HGBA1C 5.9* 08/30/2014     No results found for: TSH  Hepatic Function Panel  Recent Labs  09/04/14 0500  PROT 4.6*  ALBUMIN 1.7*  AST 22  ALT 12*  ALKPHOS 78  BILITOT 0.3   No results for input(s): CHOL in the last 72 hours. No results for input(s): PROTIME in the last 72 hours.     Studies/Results: Dg Chest Port 1 View  09/02/2014   CLINICAL DATA:  Generalized body aches.  Weakness.  Leukocytosis.  EXAM: PORTABLE CHEST - 1 VIEW  COMPARISON:  08/31/2014  FINDINGS: Support lines and tubes in appropriate position. Heart size is normal. Both lungs remain clear. No evidence of pleural effusion or pneumothorax.  IMPRESSION: No active disease.   Electronically Signed   By: Earle Gell M.D.   On: 09/02/2014 17:31    Medications: I have reviewed the patient's current medications. Scheduled Meds: . antiseptic oral rinse  7 mL Mouth Rinse q12n4p  . chlorhexidine  15 mL Mouth Rinse BID  . HYDROmorphone PCA 0.3 mg/mL   Intravenous 6 times per day  . imipenem-cilastatin  250 mg Intravenous 4 times per day  . insulin aspart  0-15 Units Subcutaneous 6 times per day  . magnesium sulfate 1 - 4 g bolus IVPB  1 g Intravenous Once  . nicotine  21  mg Transdermal Daily  . pantoprazole (PROTONIX) IV  40 mg Intravenous QHS  . potassium phosphate IVPB (mmol)  10 mmol Intravenous Once  . scopolamine  1 patch Transdermal Q72H  . sodium chloride  10-40 mL Intracatheter Q12H  . sodium chloride  3 mL Intravenous Q12H   Continuous Infusions: . Marland KitchenTPN (CLINIMIX-E) Adult 60 mL/hr at 09/03/14 1721  . Marland KitchenTPN (CLINIMIX-E) Adult    . 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 Chest Pain: resolved. Now CP free. Likely secondary to atrial flutter w/ RVR. Cardiac enzymes cycled x 3 with flat trend. Only slight bump in troponin to 0.04 x 1. 2D echo shows normal LV function, wall motion and valve anatomy. Would not recommend any further cardiac w/u at this time.   2. Atrial Flutter w/ RVR: occurred in the setting of multiple medical issues including hypokalemia, resolving SBO, metastatic rectal cancer, diarrhea and emesis. She had a recurrent but brief symptomatic episode ~5:30 am 5 days ago during and episode of emesis. Max rate >200. Symptoms resolved after emesis resolved. No further episodes on telemetry post op Now maintaining NSR on monitor. Keep K+  WNL. Continue to monitor on telemetry. If recurrent sustained episodes, can treat with IV metoprolol for rate control.  3. Hypokalemia: K is improved now 3.7. Mg now at 1.5. Not tolerating PO well. Recommend IV supplementation. Continue to monitor closely.   4. SBO: did not resolve with medical management, underwent surgery yesterday- POST op day 1 s/p sm. Bowel resection LOW ANTERIOR RECTOSIGMOID RESECTION (including stent) with COLOSTOMY APPENDECTOMY HYSTERECTOMY SUPRACERVICAL ABDOMINAL Salpingo-oophorectomy TRANSVERSE COLON RESECTION WITH PARTIAL OMENTECTOMY DEBULKING OF PERITONEUM  5. Rectal CA w/ Liver Mets: management per oncology.   6. Anemia down to 6.1 improved to 9.0 after transfused 2 units;  Today at 7.2 hgb  7. Hypotensive ost op transfusion helped. BP improved to mildly labile 105/44-146/55.       LOS: 10 days   Time spent with pt. :15 minutes. [facility] R  Nurse Practitioner Certified Pager [phone] or after 5pm and on weekends call [phone] 09/04/2014, 8:53 AM   I have examined the patient and reviewed assessment and plan and discussed with patient.  Agree with above as stated.  No atrial flutter recurrence.  Replacing electrolytes.  Watch on tele.  Danila Eddie S.

## 2014-09-04 NOTE — Progress Notes (Addendum)
[facility] PROGRESS NOTE  Sydney Morrison IRS:[id number] DOB: [birth year] DOA: 08/25/2014 PCP: Delphina Cahill, MD   Brief narrative 59 y/o female with hx of rectal ca with liver metastasis, rectal stent placement in 06/2014 (under Dr. Gearldine Shown care), status post 3 cycles of chemotherapy, last cycle completed 08/16/2014 admitted on 5/27 with small bowel obstruction and failed conservative management. taken to OR on 6/2 for exploratory laparotomy with lower anterior rectosigmoid resection with colostomy.    Assessment/Plan: Small bowel obstruction -Failed conservative mgmt.  OR  on 6/2 with  exploratory laparotomy with lower anterior rectosigmoid resection with colostomy. Also had appendectomy , hysterectomy and salpingoophorectomy, and debulking of peritoneum. -Hypotensive post op requiring aggressive IV hydration, central line placed and 2 units PRBC given for drop in H&H. Blood pressure in hemoglobin -On Dilaudid PCA for pain. Plan on clamping NG today.. On TNA. When necessary Zofran for nausea. - Continue abdominal drain. PT eval.  -recommend foley for 7 days post op given bladder manipulation during surgery.    Sepsis Patient febrile to 101.5 past 24 hours with progressive leukocytosis. Chest x-ray and UA unremarkable. Discussed option for CT abdomen with surgery but given recent laparotomy is likely to have a lot of peritoneal fluid and free air which will be misleading. Will monitor on Primaxin. (Day 10). Was on IV Flagyl  From 5/27-5/31. Follow WBC closely.  Check repeat chest x-ray.  Will consult ID.  Rectal ca with liver mets  sees Dr Learta Codding. S/p 3 cycles of chemo. Follow as outpatient.  Chest pain and Aflutter with RVR  on 5/30 cardiology consulted. 2D echo with normal EF. replenished low k and magnesium. No further episodes.   Hypokalemia/ hypophsphatemia Replenish  Thrombocytopenia  avoid heparin products. Monitor closely.  Stage II pressure ulcer Pink  foam  applied.  Tobacco abuse Continue nicotine patch.  DIET: NPO  DVT prophylaxis: SCDs   Code Status: full code Family Communication: Husband at bedside Disposition Plan: Continue stepdown monitoring.   Consultants:  CCS  Procedures:  OR on 6/2  Antibiotics:  primaxin since-- 5/27  HPI/Subjective: Seen and examined . Abdominal pain persists but controlled with PCA. No nausea or vomiting. Patient febrile to 101.54F overnight. Also has worsening leukocytosis.  Objective: Filed Vitals:   09/04/14 1200  BP: 113/38  Pulse: 90  Temp: 100 F (37.8 C)  Resp: 18    Intake/Output Summary (Last 24 hours) at 09/04/14 1349 Last data filed at 09/04/14 1300  Gross per 24 hour  Intake   4779 ml  Output   2855 ml  Net   1924 ml   Filed Weights   09/01/14 0339 09/01/14 0540 09/04/14 0400  Weight: 51.7 kg (113 lb 15.7 oz) 54.3 kg (119 lb 11.4 oz) 55.4 kg (122 lb 2.2 oz)    Exam:   General:  thin built female lying  in bed fatigued,  HEENT: dry mucosa, pallor+, supple neck, NG tube+  Cardiovascular: NS1&S2, no murmurs  Respiratory: clear b/l  Abdomen: laparotomy dressing, colostomy , abd drain in place. Bowel sounds present  Ext: warm, no edema, stage II sacral pressure ulcer (did not evaluate as patient in pain), no signs of infection per nurse during evaluation.   CNS: Fatigued, alert  and oriented    Data Reviewed: Basic Metabolic Panel:  Recent Labs Lab 08/31/14 0600 08/31/14 2022 09/01/14 0545 09/02/14 0540 09/03/14 0500 09/04/14 0500  NA 143 139 141 139 136 135  K 2.9* 3.4* 3.7 3.0* 3.3* 3.7  CL 103  108 108 103 100* 99*  CO2 28 25 28  32 30 29  GLUCOSE 166* 248* 180* 128* 141* 136*  BUN 41* 29* 26* 20 20 20   CREATININE 0.46 0.74 0.51 0.35* 0.31* <0.30*  CALCIUM 9.4 7.5* 8.0* 7.8* 7.5* 7.8*  MG 2.3  --  1.4* 1.7 1.6* 1.5*  PHOS 3.0  --  2.7 2.4* 2.2* 2.6   Liver Function Tests:  Recent Labs Lab 08/28/14 1735 08/30/14 0600 08/31/14 0600  09/04/14 0500  AST 22 26 22 22   ALT 19 16 16  12*  ALKPHOS 75 84 78 78  BILITOT 0.9 1.0 0.5 0.3  PROT 6.6 6.8 7.0 4.6*  ALBUMIN 3.2* 3.5 3.5 1.7*   No results for input(s): LIPASE, AMYLASE in the last 168 hours. No results for input(s): AMMONIA in the last 168 hours. CBC:  Recent Labs Lab 08/30/14 0600 08/31/14 2022 09/01/14 0545 09/02/14 0800 09/03/14 1000 09/04/14 0500  WBC 4.7 14.0* 17.8* 23.8* 23.0* 25.2*  NEUTROABS 1.3*  --   --   --   --  21.9*  HGB 10.4* 6.1* 9.0* 8.1* 7.2* 7.2*  HCT 33.0* 19.5* 26.7* 25.0* 22.4* 22.4*  MCV 83.3 85.5 83.7 86.5 87.8 85.5  PLT 146* 99* 91* 77* 64* 72*   Cardiac Enzymes:  Recent Labs Lab 08/28/14 1735 08/28/14 2255 08/29/14 0455  TROPONINI <0.03 0.04* <0.03   BNP (last 3 results) No results for input(s): BNP in the last 8760 hours.  ProBNP (last 3 results) No results for input(s): PROBNP in the last 8760 hours.  CBG:  Recent Labs Lab 09/03/14 1704 09/03/14 2019 09/03/14 2347 09/04/14 0445 09/04/14 1210  GLUCAP 160* 131* 122* 114* 129*    Recent Results (from the past 240 hour(s))  Culture, blood (routine x 2)     Status: None   Collection Time: 08/26/14  7:05 AM  Result Value Ref Range Status   Specimen Description BLOOD RIGHT ARM  Final   Special Requests   Final    BOTTLES DRAWN AEROBIC AND ANAEROBIC 10CC BOTH BOTTLES   Culture   Final    NO GROWTH 5 DAYS Performed at [facility]    Report Status 09/01/2014 FINAL  Final  Culture, blood (routine x 2)     Status: None   Collection Time: 08/26/14  7:10 AM  Result Value Ref Range Status   Specimen Description BLOOD LEFT ARM  Final   Special Requests   Final    BOTTLES DRAWN AEROBIC AND ANAEROBIC 10CC BOTH BOTTLES   Culture   Final    NO GROWTH 5 DAYS Performed at [facility]    Report Status 09/01/2014 FINAL  Final  Surgical pcr screen     Status: None   Collection Time: 08/30/14 10:08 PM  Result Value Ref Range Status   MRSA, PCR  NEGATIVE NEGATIVE Final   Staphylococcus aureus NEGATIVE NEGATIVE Final    Comment:        The Xpert SA Assay (FDA approved for NASAL specimens in patients over 26 years of age), is one component of a comprehensive surveillance program.  Test performance has been validated by [facility] for patients greater than or equal to 48 year old. It is not intended to diagnose infection nor to guide or monitor treatment.      Studies: Dg Chest Port 1 View  09/02/2014   CLINICAL DATA:  Generalized body aches.  Weakness.  Leukocytosis.  EXAM: PORTABLE CHEST - 1 VIEW  COMPARISON:  08/31/2014  FINDINGS: Support lines and tubes in appropriate position. Heart size is normal. Both lungs remain clear. No evidence of pleural effusion or pneumothorax.  IMPRESSION: No active disease.   Electronically Signed   By: Earle Gell M.D.   On: 09/02/2014 17:31    Scheduled Meds: . antiseptic oral rinse  7 mL Mouth Rinse q12n4p  . chlorhexidine  15 mL Mouth Rinse BID  . HYDROmorphone PCA 0.3 mg/mL   Intravenous 6 times per day  . imipenem-cilastatin  250 mg Intravenous 4 times per day  . insulin aspart  0-15 Units Subcutaneous 6 times per day  . nicotine  21 mg Transdermal Daily  . pantoprazole (PROTONIX) IV  40 mg Intravenous QHS  . potassium phosphate IVPB (mmol)  10 mmol Intravenous Once  . scopolamine  1 patch Transdermal Q72H  . sodium chloride  10-40 mL Intracatheter Q12H  . sodium chloride  3 mL Intravenous Q12H   Continuous Infusions: . Marland KitchenTPN (CLINIMIX-E) Adult 60 mL/hr at 09/03/14 1721  . Marland KitchenTPN (CLINIMIX-E) Adult    . sodium chloride 75 mL/hr at 09/04/14 1141      Time spent: 35 minutes    Inara Dike, [place] [facility] Pager [phone] If 7PM-7AM, please contact night-coverage at [email or web address], password [facility] 09/04/2014, 1:49 PM  LOS: 10 days

## 2014-09-04 NOTE — Progress Notes (Signed)
Patient ID: Sydney Morrison, female   DOB: Jan 20, 1956, 59 y.o.   MRN: [id number]  [facility]  POD#: 4  Subjective: Patient in bed, stepdown, husband at bedside.  OOB to chair yesterday.  Wound care.  Objective: Vital signs in last 24 hours: Temp:  [98.9 F (37.2 C)-101.5 F (38.6 C)] 99.9 F (37.7 C) (06/06 0400) Pulse Rate:  [81-107] 94 (06/06 0600) Resp:  [15-27] 19 (06/06 0600) BP: (105-146)/(44-61) 132/55 mmHg (06/06 0600) SpO2:  [92 %-99 %] 97 % (06/06 0600) Weight:  [55.4 kg (122 lb 2.2 oz)] 55.4 kg (122 lb 2.2 oz) (06/06 0400) Last BM Date: 08/29/14  Intake/Output from previous day: 06/05 0701 - 06/06 0700 In: 4634 [I.V.:1725; IV Piggyback:1260; TPN:1559] Out: 3050 [Urine:2350; Emesis/NG output:600; Drains:65; Stool:35] Intake/Output this shift:    Physical Exam: HEENT - sclerae clear, mucous membranes moist Neck - soft, CVP on left Chest - clear bilaterally Cor - RRR Abdomen - soft, dressing dry and intact; stoma left abd wall - viable, small liquid in bag; few BS present Ext - no edema, non-tender Neuro - alert & oriented, no focal deficits  Lab Results:   Recent Labs  09/03/14 1000 09/04/14 0500  WBC 23.0* 25.2*  HGB 7.2* 7.2*  HCT 22.4* 22.4*  PLT 64* 72*   BMET  Recent Labs  09/03/14 0500 09/04/14 0500  NA 136 135  K 3.3* 3.7  CL 100* 99*  CO2 30 29  GLUCOSE 141* 136*  BUN 20 20  CREATININE 0.31* <0.30*  CALCIUM 7.5* 7.8*   PT/INR No results for input(s): LABPROT, INR in the last 72 hours. Comprehensive Metabolic Panel:    Component Value Date/Time   NA 135 09/04/2014 0500   NA 136 09/03/2014 0500   NA 140 08/16/2014 1037   NA 138 08/02/2014 1112   K 3.7 09/04/2014 0500   K 3.3* 09/03/2014 0500   K 4.2 08/16/2014 1037   K 4.4 08/02/2014 1112   CL 99* 09/04/2014 0500   CL 100* 09/03/2014 0500   CO2 29 09/04/2014 0500   CO2 30 09/03/2014 0500   CO2 28 08/16/2014 1037   CO2 25 08/02/2014 1112    BUN 20 09/04/2014 0500   BUN 20 09/03/2014 0500   BUN 23.1 08/16/2014 1037   BUN 22.0 08/02/2014 1112   CREATININE <0.30* 09/04/2014 0500   CREATININE 0.31* 09/03/2014 0500   CREATININE 0.7 08/16/2014 1037   CREATININE 0.7 08/02/2014 1112   GLUCOSE 136* 09/04/2014 0500   GLUCOSE 141* 09/03/2014 0500   GLUCOSE 92 08/16/2014 1037   GLUCOSE 96 08/02/2014 1112   CALCIUM 7.8* 09/04/2014 0500   CALCIUM 7.5* 09/03/2014 0500   CALCIUM 9.5 08/16/2014 1037   CALCIUM 9.6 08/02/2014 1112   AST 22 09/04/2014 0500   AST 22 08/31/2014 0600   AST 21 08/16/2014 1037   AST 21 08/02/2014 1112   ALT 12* 09/04/2014 0500   ALT 16 08/31/2014 0600   ALT 15 08/16/2014 1037   ALT 14 08/02/2014 1112   ALKPHOS 78 09/04/2014 0500   ALKPHOS 78 08/31/2014 0600   ALKPHOS 143 08/16/2014 1037   ALKPHOS 175* 08/02/2014 1112   BILITOT 0.3 09/04/2014 0500   BILITOT 0.5 08/31/2014 0600   BILITOT 0.28 08/16/2014 1037   BILITOT 0.28 08/02/2014 1112   PROT 4.6* 09/04/2014 0500   PROT 7.0 08/31/2014 0600   PROT 7.5 08/16/2014 1037   PROT 7.6 08/02/2014 1112   ALBUMIN 1.7* 09/04/2014 0500  ALBUMIN 3.5 08/31/2014 0600   ALBUMIN 3.8 08/16/2014 1037   ALBUMIN 3.6 08/02/2014 1112    Studies/Results: Dg Chest Port 1 View  09/02/2014   CLINICAL DATA:  Generalized body aches.  Weakness.  Leukocytosis.  EXAM: PORTABLE CHEST - 1 VIEW  COMPARISON:  08/31/2014  FINDINGS: Support lines and tubes in appropriate position. Heart size is normal. Both lungs remain clear. No evidence of pleural effusion or pneumothorax.  IMPRESSION: No active disease.   Electronically Signed   By: Earle Gell M.D.   On: 09/02/2014 17:31    Anti-infectives: Anti-infectives    Start     Dose/Rate Route Frequency Ordered Stop   09/01/14 1200  imipenem-cilastatin (PRIMAXIN) 250 mg in sodium chloride 0.9 % 100 mL IVPB     250 mg 200 mL/hr over 30 Minutes Intravenous 4 times per day 09/01/14 1051     08/31/14 1030  clindamycin (CLEOCIN) 900  mg, gentamicin (GARAMYCIN) 240 mg in sodium chloride 0.9 % 1,000 mL for intraperitoneal lavage  Status:  Discontinued      Intraperitoneal To Surgery 08/31/14 1023 08/31/14 1513   08/31/14 0600  clindamycin (CLEOCIN) IVPB 900 mg     900 mg 100 mL/hr over 30 Minutes Intravenous 60 min pre-op 08/30/14 1218 08/31/14 0944   08/31/14 0600  [MAR Hold]  gentamicin (GARAMYCIN) 220 mg in dextrose 5 % 100 mL IVPB     (MAR Hold since 08/31/14 1010)   5 mg/kg  44.1 kg 105.5 mL/hr over 60 Minutes Intravenous 60 min pre-op 08/30/14 1218 08/31/14 1005   08/30/14 2100  imipenem-cilastatin (PRIMAXIN) 250 mg in sodium chloride 0.9 % 100 mL IVPB  Status:  Discontinued     250 mg 200 mL/hr over 30 Minutes Intravenous 3 times per day 08/30/14 1739 09/01/14 1051   08/25/14 1800  imipenem-cilastatin (PRIMAXIN) 250 mg in sodium chloride 0.9 % 100 mL IVPB  Status:  Discontinued     250 mg 200 mL/hr over 30 Minutes Intravenous Every 6 hours 08/25/14 1716 08/30/14 1739   08/25/14 1800  metroNIDAZOLE (FLAGYL) IVPB 500 mg  Status:  Discontinued     500 mg 100 mL/hr over 60 Minutes Intravenous Every 8 hours 08/25/14 1716 08/27/14 1003      Assessment & Plans:  Small bowel obstruction, rectosigmoid cancer with perforation, pelvic carcinomatosis, omental central caking/carcinoimatosis, liver metastases s/p expl lap, SB resection, rectosigmoid colon resection, colostomy, appendectomy, TAH, transverse colon resection, partial omentectomy, peritoneal debulking 08/31/14 (Dr. Johney Maine)   Clamp NG tube today and monitor  Continue Primaxin - monitor elevated WBC daily, monitor fever  Would hold off on CT abd for now - not sure findings would be helpful at this point  OOB, ambulate  Wound care with twice daily dressing changes  TNA per pharmacy    Earnstine Regal, MD, [facility] Office: [place] 09/04/2014

## 2014-09-04 NOTE — Progress Notes (Signed)
Date:  September 04, 2014 U.R. performed for needs and level of care. Pod 4 Remains, hypotensive, on iv flds, wbc 25.2, hgb 7.2 after two units of prbc on [id number]. Will continue to follow for Case Management needs.  Velva Harman, RN, BSN, [place]   [phone]

## 2014-09-04 NOTE — Consult Note (Signed)
WOC ostomy follow up Stoma type/location: LLQ Colostomy pale pink and viable.  No ostomy function at this time.  Stomal assessment/size: not assessed today.  Peristomal assessment: not assessed today Treatment options for stomal/peristomal skin: Barrier ring Output None Ostomy pouching: 2pc. pouching  Education provided: Spouse observed pouch change Friday.  Spouse is not at bedside today.  Wife states he has to return to work and may be in the room in the mornings.  My cell phone number is left with patient to give to spouse so he can text me when he will be in the room for ongoing education.  Patient is groggy and difficult to arouse.  Supplies are in the room.  Pouch is intact and will not be changed today.  Enrolled patient in [place] Start Discharge program: No Will not follow at this time.  Please re-consult if needed.  Domenic Moras RN BSN [place] Pager [phone]

## 2014-09-05 ENCOUNTER — Encounter: Payer: Self-pay | Admitting: *Deleted

## 2014-09-05 DIAGNOSIS — L89150 Pressure ulcer of sacral region, unstageable: Secondary | ICD-10-CM

## 2014-09-05 DIAGNOSIS — D62 Acute posthemorrhagic anemia: Secondary | ICD-10-CM

## 2014-09-05 LAB — GLUCOSE, CAPILLARY
GLUCOSE-CAPILLARY: 115 mg/dL — AB (ref 65–99)
GLUCOSE-CAPILLARY: 122 mg/dL — AB (ref 65–99)
GLUCOSE-CAPILLARY: 129 mg/dL — AB (ref 65–99)
GLUCOSE-CAPILLARY: 132 mg/dL — AB (ref 65–99)
GLUCOSE-CAPILLARY: 135 mg/dL — AB (ref 65–99)
Glucose-Capillary: 126 mg/dL — ABNORMAL HIGH (ref 65–99)
Glucose-Capillary: 129 mg/dL — ABNORMAL HIGH (ref 65–99)
Glucose-Capillary: 139 mg/dL — ABNORMAL HIGH (ref 65–99)
Glucose-Capillary: 143 mg/dL — ABNORMAL HIGH (ref 65–99)

## 2014-09-05 LAB — CBC
HEMATOCRIT: 21.3 % — AB (ref 36.0–46.0)
Hemoglobin: 6.8 g/dL — CL (ref 12.0–15.0)
MCH: 27.5 pg (ref 26.0–34.0)
MCHC: 31.9 g/dL (ref 30.0–36.0)
MCV: 86.2 fL (ref 78.0–100.0)
Platelets: 71 10*3/uL — ABNORMAL LOW (ref 150–400)
RBC: 2.47 MIL/uL — ABNORMAL LOW (ref 3.87–5.11)
RDW: 17.9 % — ABNORMAL HIGH (ref 11.5–15.5)
WBC: 22.3 10*3/uL — ABNORMAL HIGH (ref 4.0–10.5)

## 2014-09-05 LAB — TRIGLYCERIDES: Triglycerides: 74 mg/dL (ref <150)

## 2014-09-05 LAB — PREPARE RBC (CROSSMATCH)

## 2014-09-05 MED ORDER — TRACE MINERALS CR-CU-MN-SE-ZN 10-1000-500-60 MCG/ML IV SOLN
INTRAVENOUS | Status: AC
  Administered 2014-09-05: 18:00:00 via INTRAVENOUS
  Filled 2014-09-05: qty 1440

## 2014-09-05 MED ORDER — SODIUM CHLORIDE 0.9 % IV SOLN
Freq: Once | INTRAVENOUS | Status: AC
  Administered 2014-09-05: 10 mL via INTRAVENOUS

## 2014-09-05 NOTE — Progress Notes (Signed)
Patient ID: Sydney Morrison, female   DOB: [birth year], 59 y.o.   MRN: [id number]  [facility]  POD#: 5  Subjective: Patient awake, alert.  No specific complaints this AM.  Did not get much rest last night.  Wants NG out.  Coughing up "green".  Objective: Vital signs in last 24 hours: Temp:  [98.6 F (37 C)-101.6 F (38.7 C)] 98.6 F (37 C) (06/07 0400) Pulse Rate:  [87-90] 87 (06/06 1400) Resp:  [18-25] 20 (06/07 0600) BP: (107-147)/(37-61) 107/53 mmHg (06/07 0600) SpO2:  [93 %-98 %] 97 % (06/07 0600) Weight:  [56.3 kg (124 lb 1.9 oz)] 56.3 kg (124 lb 1.9 oz) (06/07 0500) Last BM Date: 09/04/14  Intake/Output from previous day: 06/06 0701 - 06/07 0700 In: 4800.2 [I.V.:1725; NG/GT:360; IV Piggyback:903; TPN:1332.2] Out: 3305 [Urine:3150; Emesis/NG output:100; Drains:45; Stool:10] Intake/Output this shift:    Physical Exam: HEENT - sclerae clear, mucous membranes moist Neck - soft, CVP on left Chest - coarse bilaterally Cor - RRR, rate 80's Abdomen - soft, dressing dry and intact; quiet; JP drain with thin serous; stoma viable, no output Ext - no edema, non-tender Neuro - alert & oriented, no focal deficits  Lab Results:   Recent Labs  09/04/14 0500 09/05/14 0400  WBC 25.2* 22.3*  HGB 7.2* 6.8*  HCT 22.4* 21.3*  PLT 72* 71*   BMET  Recent Labs  09/03/14 0500 09/04/14 0500  NA 136 135  K 3.3* 3.7  CL 100* 99*  CO2 30 29  GLUCOSE 141* 136*  BUN 20 20  CREATININE 0.31* <0.30*  CALCIUM 7.5* 7.8*   PT/INR No results for input(s): LABPROT, INR in the last 72 hours. Comprehensive Metabolic Panel:    Component Value Date/Time   NA 135 09/04/2014 0500   NA 136 09/03/2014 0500   NA 140 08/16/2014 1037   NA 138 08/02/2014 1112   K 3.7 09/04/2014 0500   K 3.3* 09/03/2014 0500   K 4.2 08/16/2014 1037   K 4.4 08/02/2014 1112   CL 99* 09/04/2014 0500   CL 100* 09/03/2014 0500   CO2 29 09/04/2014 0500   CO2 30 09/03/2014  0500   CO2 28 08/16/2014 1037   CO2 25 08/02/2014 1112   BUN 20 09/04/2014 0500   BUN 20 09/03/2014 0500   BUN 23.1 08/16/2014 1037   BUN 22.0 08/02/2014 1112   CREATININE <0.30* 09/04/2014 0500   CREATININE 0.31* 09/03/2014 0500   CREATININE 0.7 08/16/2014 1037   CREATININE 0.7 08/02/2014 1112   GLUCOSE 136* 09/04/2014 0500   GLUCOSE 141* 09/03/2014 0500   GLUCOSE 92 08/16/2014 1037   GLUCOSE 96 08/02/2014 1112   CALCIUM 7.8* 09/04/2014 0500   CALCIUM 7.5* 09/03/2014 0500   CALCIUM 9.5 08/16/2014 1037   CALCIUM 9.6 08/02/2014 1112   AST 22 09/04/2014 0500   AST 22 08/31/2014 0600   AST 21 08/16/2014 1037   AST 21 08/02/2014 1112   ALT 12* 09/04/2014 0500   ALT 16 08/31/2014 0600   ALT 15 08/16/2014 1037   ALT 14 08/02/2014 1112   ALKPHOS 78 09/04/2014 0500   ALKPHOS 78 08/31/2014 0600   ALKPHOS 143 08/16/2014 1037   ALKPHOS 175* 08/02/2014 1112   BILITOT 0.3 09/04/2014 0500   BILITOT 0.5 08/31/2014 0600   BILITOT 0.28 08/16/2014 1037   BILITOT 0.28 08/02/2014 1112   PROT 4.6* 09/04/2014 0500   PROT 7.0 08/31/2014 0600   PROT 7.5 08/16/2014 1037  PROT 7.6 08/02/2014 1112   ALBUMIN 1.7* 09/04/2014 0500   ALBUMIN 3.5 08/31/2014 0600   ALBUMIN 3.8 08/16/2014 1037   ALBUMIN 3.6 08/02/2014 1112    Studies/Results: Dg Chest Port 1 View  09/04/2014   CLINICAL DATA:  Sepsis.  Fever, weakness and cough  EXAM: PORTABLE CHEST - 1 VIEW  COMPARISON:  09/02/2014  FINDINGS: There is a left subclavian catheter with tip in the projection of the SVC. Left IJ catheter is also noted with tip in the SVC. The nasogastric tube is in the stomach. There is no pleural effusion identified. New opacity is identified in the right base. Left lung appears clear.  IMPRESSION: 1. New right lung base opacity.   Electronically Signed   By: Kerby Moors M.D.   On: 09/04/2014 15:16    Anti-infectives: Anti-infectives    Start     Dose/Rate Route Frequency Ordered Stop   09/01/14 1200   imipenem-cilastatin (PRIMAXIN) 250 mg in sodium chloride 0.9 % 100 mL IVPB     250 mg 200 mL/hr over 30 Minutes Intravenous 4 times per day 09/01/14 1051     08/31/14 1030  clindamycin (CLEOCIN) 900 mg, gentamicin (GARAMYCIN) 240 mg in sodium chloride 0.9 % 1,000 mL for intraperitoneal lavage  Status:  Discontinued      Intraperitoneal To Surgery 08/31/14 1023 08/31/14 1513   08/31/14 0600  clindamycin (CLEOCIN) IVPB 900 mg     900 mg 100 mL/hr over 30 Minutes Intravenous 60 min pre-op 08/30/14 1218 08/31/14 0944   08/31/14 0600  [MAR Hold]  gentamicin (GARAMYCIN) 220 mg in dextrose 5 % 100 mL IVPB     (MAR Hold since 08/31/14 1010)   5 mg/kg  44.1 kg 105.5 mL/hr over 60 Minutes Intravenous 60 min pre-op 08/30/14 1218 08/31/14 1005   08/30/14 2100  imipenem-cilastatin (PRIMAXIN) 250 mg in sodium chloride 0.9 % 100 mL IVPB  Status:  Discontinued     250 mg 200 mL/hr over 30 Minutes Intravenous 3 times per day 08/30/14 1739 09/01/14 1051   08/25/14 1800  imipenem-cilastatin (PRIMAXIN) 250 mg in sodium chloride 0.9 % 100 mL IVPB  Status:  Discontinued     250 mg 200 mL/hr over 30 Minutes Intravenous Every 6 hours 08/25/14 1716 08/30/14 1739   08/25/14 1800  metroNIDAZOLE (FLAGYL) IVPB 500 mg  Status:  Discontinued     500 mg 100 mL/hr over 60 Minutes Intravenous Every 8 hours 08/25/14 1716 08/27/14 1003      Assessment & Plans: Small bowel obstruction, rectosigmoid cancer with perforation, pelvic carcinomatosis, omental central caking/carcinoimatosis, liver metastases s/p expl lap, SB resection, rectosigmoid colon resection, colostomy, appendectomy, TAH, transverse colon resection, partial omentectomy, peritoneal debulking 08/31/14 (Dr. Johney Maine)  Discontinue NG tube, ice chips, sips of water only Continue Primaxin per ID consult - monitor elevated WBC daily, monitor fever OOB, ambulate - PT to see daily Wound care with twice daily dressing  changes TNA per pharmacy  Earnstine Regal, MD, [facility] Office: [place] 09/05/2014

## 2014-09-05 NOTE — Progress Notes (Signed)
PARENTERAL NUTRITION CONSULT NOTE - Follow-up  Pharmacy Consult for TPN Indication: Persistent SBO  Allergies  Allergen Reactions  . Avelox [Moxifloxacin Hcl In Nacl] Hives  . Codeine Hives  . Dextromethorphan Hives  . Erythromycin Hives  . Penicillins Hives    No problem with Imipenem  . Suprep [Na Sulfate-K Sulfate-Mg Sulf] Nausea And Vomiting    Patient Measurements: Height: 5' 7"  (170.2 cm) Weight: 124 lb 1.9 oz (56.3 kg) IBW/kg (Calculated) : 61.6 Adjusted Body Weight: 44.1 kg Usual Weight: 50.4 kg  Vital Signs: Temp: 98.6 F (37 C) (06/07 0400) Temp Source: Oral (06/07 0400) BP: 107/53 mmHg (06/07 0600) Intake/Output from previous day: 06/06 0701 - 06/07 0700 In: 4800.2 [I.V.:1725; NG/GT:360; IV Piggyback:903; TPN:1332.2] Out: 3305 [Urine:3150; Emesis/NG output:100; Drains:45; Stool:10]  Labs:  Recent Labs  09/03/14 1000 09/04/14 0500 09/05/14 0400  WBC 23.0* 25.2* 22.3*  HGB 7.2* 7.2* 6.8*  HCT 22.4* 22.4* 21.3*  PLT 64* 72* 71*     Recent Labs  09/03/14 0500 09/04/14 0500  NA 136 135  K 3.3* 3.7  CL 100* 99*  CO2 30 29  GLUCOSE 141* 136*  BUN 20 20  CREATININE 0.31* <0.30*  CALCIUM 7.5* 7.8*  MG 1.6* 1.5*  PHOS 2.2* 2.6  PROT  --  4.6*  ALBUMIN  --  1.7*  AST  --  22  ALT  --  12*  ALKPHOS  --  78  BILITOT  --  0.3  PREALBUMIN  --  6.0*   CrCl cannot be calculated (Patient has no serum creatinine result on file.).    Recent Labs  09/04/14 2005 09/04/14 2349 09/05/14 0359  GLUCAP 108* 135* 139*   Medications, infusions:  . Marland KitchenTPN (CLINIMIX-E) Adult 60 mL/hr at 09/04/14 1757  . sodium chloride 75 mL/hr at 09/05/14 0405   Insulin Requirements: Moderate SSI q4h: 8 units / 24 hours.  Current Nutrition: NPO  IVF: NS at 75 ml/hr  Central access: CVC port L chest TPN start date: 6/1  ASSESSMENT                                                                                                          HPI: 59yoF with PMH  metastatic rectal cancer and rectal stent 06/2014, s/p 3 cycles chemo presents 5/27 with N/V x 1 day.  Found to have SBO, likely secondary to pelvic tumor on CT.  NG tube placed and later removed once SBO improved on Abd XR.  Started on CL diet, but persistent N/V prevent meaningful PO intake.  To start on TPN per pharmacy for nutritional supplementation until tolerating PO.  Significant events:  5/31: patient misunderstood Surgeon to imply that TPN was not going to start today and did not allow TPN to be initiated. 6/2: s/p exp laparotomy, SB resection, LAR, stent, colostomy, appendectomy, hysterectomy, salpingo-oophorectomy, transverse colon resection, debulking of peritoneum. 6/6: clamp NG tube  Today:   Glucose (goal <150): No Hx DM.  cbgs <150   Electrolytes (6/6): Na WNL. CoCa wnl, phos 2.6, Mag 1.5  Renal:  SCr low  LFTs (6/6): AST wnl, ALT low, Alk Phos and Tbili wnl   TGs: elevated at baseline with 226 (6/1)  Prealbumin: 13.2 (6/1)  NUTRITIONAL GOALS                                                                                             RD recs: Nutrition needs: 1600-1800 kcal, 70-80 grams protein.  Per new ASPEN and SCCM guidelines, HOLD lipids during the first seven days of therapy while pt meets ICU status. Goal will be to meet 100% of the patient's protein needs and approximately 80% of their caloric needs.  Clinimix E 5/20 @ 60 mL/hr to provide 72 grams protein (100% of goal) and 1267 kcal (79% of goal)   PLAN                                                                                                                         At 1800 today:  Continue Clinimix E 5/20 IV at 60 ml/hr.  Hold IV lipids for first 7 days while in ICU per protocol (6/3-6/9)  TPN to contain standard multivitamins and trace elements.  IVF per MD orders, NS at 75 ml/hr.    Continue CBGs and moderate SSI q4h   TPN lab panels on Mondays & Thursdays.   Dia Sitter, PharmD,  BCPS 09/05/2014 7:23 AM

## 2014-09-05 NOTE — Progress Notes (Signed)
Maintaining SR, call if problems.    I have examined the patient and reviewed assessment and plan and discussed with patient.  Agree with above as stated.    Sydney Walrond S.

## 2014-09-05 NOTE — Progress Notes (Signed)
She is recovering from surgery. I will schedule outpatient follow-up to discuss resuming systemic therapy.  Please call oncology as needed. I will check on her 09/11/2014 if she remains in the hospital.

## 2014-09-05 NOTE — Progress Notes (Signed)
Physical Therapy Treatment Patient Details Name: Sydney Morrison MRN: [id number] DOB: [birth year] Today's Date: 09/05/2014    History of Present Illness 59 y/o female with hx of rectal ca with liver metastasis, rectal stent placement in 06/2014 (under Dr. Gearldine Shown care), status post 3 cycles of chemotherapy, last cycle completed 08/16/2014 admitted on 5/27 with small bowel obstruction and failed conservative management. taken to OR on 6/2 for exploratory laparotomy with lower anterior rectosigmoid resection with colostomy.    PT Comments    Pt feeling better today, able to incr gait distance, sitting rest between distances d/t LE weakness; Pt very pleased with her progress; pt may amb with nursing staff as tol  Follow Up Recommendations  Home health PT;SNF     Equipment Recommendations  Rolling walker with 5" wheels    Recommendations for Other Services       Precautions / Restrictions Precautions Precautions: Fall Restrictions Weight Bearing Restrictions: No    Mobility  Bed Mobility Overal bed mobility: Needs Assistance Bed Mobility: Rolling;Supine to Sit;Sidelying to Sit;Sit to Sidelying Rolling: Min assist Sidelying to sit: Min assist;+2 for safety/equipment     Sit to sidelying: +2 for physical assistance;Min assist;+2 for safety/equipment General bed mobility comments: multi-modal cues for technique, assist for LEs and trunk  Transfers Overall transfer level: Needs assistance Equipment used: Rolling walker (2 wheeled) Transfers: Sit to/from Stand Sit to Stand: +2 safety/equipment;Min assist         General transfer comment: incr time, cues for hand placement and safety  Ambulation/Gait Ambulation/Gait assistance: Min assist;+2 safety/equipment Ambulation Distance (Feet): 10 Feet (22' more) Assistive device: Rolling walker (2 wheeled) Gait Pattern/deviations: Step-through pattern;Trunk flexed;Decreased stride length Gait velocity: pt with 2/4 DOE, O2 sats  95% on RA, O2 replaced after PT, HR 109   General Gait Details: cues for step length and RW safety;  assist with balance, maneuvering RW;    Stairs            Wheelchair Mobility    Modified Rankin (Stroke Patients Only)       Balance Overall balance assessment: Needs assistance Sitting-balance support: Feet supported Sitting balance-Leahy Scale: Good       Standing balance-Leahy Scale: Poor Standing balance comment: requires support of RW for balance                    Cognition Arousal/Alertness: Awake/alert Behavior During Therapy: WFL for tasks assessed/performed Overall Cognitive Status: Within Functional Limits for tasks assessed                      Exercises      General Comments        Pertinent Vitals/Pain Pain Assessment: 0-10 Pain Score: 5  Pain Location: abd Pain Descriptors / Indicators: Aching Pain Intervention(s): Limited activity within patient's tolerance;Monitored during session;PCA encouraged;Repositioned    Home Living                      Prior Function            PT Goals (current goals can now be found in the care plan section) Acute Rehab PT Goals Patient Stated Goal: home per husband PT Goal Formulation: With patient/family Time For Goal Achievement: 09/17/14 Potential to Achieve Goals: Good Progress towards PT goals: Progressing toward goals    Frequency  Min 3X/week    PT Plan Current plan remains appropriate    Co-evaluation  End of Session   Activity Tolerance: Patient tolerated treatment well Patient left: in bed;with call bell/phone within reach;with family/visitor present     Time: 1040-1104 PT Time Calculation (min) (ACUTE ONLY): 24 min  Charges:  $Gait Training: 23-37 mins                    G Codes:      Sydney Morrison Oct 01, 2014, 12:39 PM

## 2014-09-05 NOTE — [facility] Oncology Navigator Note (Signed)
Oncology Nurse Navigator Documentation  Oncology Nurse Navigator Flowsheets 09/01/2014 09/05/2014  Navigator Encounter Type Other Hospital visit  Patient Visit Type Inpatient Inpatient  Treatment Phase Other S/P surgery  Barriers/Navigation Needs No barriers at this time Family concerns-husband is anxious and not getting enough rest according to [place]. She is sad, but has strong faith in God to cope. Has not been able to work with her colostomy yet.  Support Groups/Services - Emotional support, prayed with patient and her sister, gave her gift from [employer] from [facility] and she was touched.  Time Spent with Patient 15 10

## 2014-09-05 NOTE — Progress Notes (Signed)
[facility]    Subjective: Pleased to have NGT removed, does report some aggravation in abdominal pain this morning with some coughing, otherwise notes doing well.  Last fever recorded at midnight.   Antibiotics:  Anti-infectives    Start     Dose/Rate Route Frequency Ordered Stop   09/01/14 1200  imipenem-cilastatin (PRIMAXIN) 250 mg in sodium chloride 0.9 % 100 mL IVPB     250 mg 200 mL/hr over 30 Minutes Intravenous 4 times per day 09/01/14 1051     08/31/14 1030  clindamycin (CLEOCIN) 900 mg, gentamicin (GARAMYCIN) 240 mg in sodium chloride 0.9 % 1,000 mL for intraperitoneal lavage  Status:  Discontinued      Intraperitoneal To Surgery 08/31/14 1023 08/31/14 1513   08/31/14 0600  clindamycin (CLEOCIN) IVPB 900 mg     900 mg 100 mL/hr over 30 Minutes Intravenous 60 min pre-op 08/30/14 1218 08/31/14 0944   08/31/14 0600  [MAR Hold]  gentamicin (GARAMYCIN) 220 mg in dextrose 5 % 100 mL IVPB     (MAR Hold since 08/31/14 1010)   5 mg/kg  44.1 kg 105.5 mL/hr over 60 Minutes Intravenous 60 min pre-op 08/30/14 1218 08/31/14 1005   08/30/14 2100  imipenem-cilastatin (PRIMAXIN) 250 mg in sodium chloride 0.9 % 100 mL IVPB  Status:  Discontinued     250 mg 200 mL/hr over 30 Minutes Intravenous 3 times per day 08/30/14 1739 09/01/14 1051   08/25/14 1800  imipenem-cilastatin (PRIMAXIN) 250 mg in sodium chloride 0.9 % 100 mL IVPB  Status:  Discontinued     250 mg 200 mL/hr over 30 Minutes Intravenous Every 6 hours 08/25/14 1716 08/30/14 1739   08/25/14 1800  metroNIDAZOLE (FLAGYL) IVPB 500 mg  Status:  Discontinued     500 mg 100 mL/hr over 60 Minutes Intravenous Every 8 hours 08/25/14 1716 08/27/14 1003      Medications: Scheduled Meds: . sodium chloride   Intravenous Once  . antiseptic oral rinse  7 mL Mouth Rinse q12n4p  . chlorhexidine  15 mL Mouth Rinse BID  . HYDROmorphone PCA 0.3 mg/mL   Intravenous 6 times per day  . imipenem-cilastatin  250 mg  Intravenous 4 times per day  . insulin aspart  0-15 Units Subcutaneous 6 times per day  . nicotine  21 mg Transdermal Daily  . pantoprazole (PROTONIX) IV  40 mg Intravenous QHS  . scopolamine  1 patch Transdermal Q72H  . sodium chloride  10-40 mL Intracatheter Q12H  . sodium chloride  3 mL Intravenous Q12H   Continuous Infusions: . Marland KitchenTPN (CLINIMIX-E) Adult 60 mL/hr at 09/04/14 1757  . Marland KitchenTPN (CLINIMIX-E) Adult    . sodium chloride 75 mL/hr at 09/05/14 0405   PRN Meds:.acetaminophen **OR** acetaminophen, diphenhydrAMINE **OR** diphenhydrAMINE, HYDROmorphone (DILAUDID) injection, LORazepam, metoCLOPramide, naloxone **AND** sodium chloride, nitroGLYCERIN, ondansetron (ZOFRAN) IV **OR** ondansetron (ZOFRAN) IV, ondansetron (ZOFRAN) IV, ondansetron, phenol, promethazine, sodium chloride    Objective: Weight change: 1 lb 15.7 oz (0.9 kg)  Intake/Output Summary (Last 24 hours) at 09/05/14 1135 Last data filed at 09/05/14 0600  Gross per 24 hour  Intake 3910.17 ml  Output   2845 ml  Net 1065.17 ml   Blood pressure 126/53, pulse 87, temperature 99.5 F (37.5 C), temperature source Oral, resp. rate 20, height 5\' 7"  (1.702 m), weight 124 lb 1.9 oz (56.3 kg), SpO2 97 %. Temp:  [98.6 F (37 C)-101.6 F (38.7 C)] 99.5 F (37.5 C) (06/07 0800) Pulse Rate:  [87-90]  87 (06/06 1400) Resp:  [18-25] 20 (06/07 0800) BP: (107-147)/(37-61) 126/53 mmHg (06/07 0800) SpO2:  [93 %-97 %] 97 % (06/07 0800) Weight:  [124 lb 1.9 oz (56.3 kg)] 124 lb 1.9 oz (56.3 kg) (06/07 0500)  Physical Exam: General: resting in bed in NAD HEENT: no scleral icterus Cardiac: RRR, no rubs, murmurs or gallops Pulm: clear to auscultation bilaterally from anterior lung fields Abd: soft, diffusely tender, ostomy bag in place, normal bowel sounds Ext: warm and well perfused, no pedal edema Neuro: alert and oriented X3  CBC: @LABBLAST3 (wbc3,Hgb:3,Hct:3,Plt:3,INR:3APTT:3)@   BMET  Recent Labs  09/03/14 0500  09/04/14 0500  NA 136 135  K 3.3* 3.7  CL 100* 99*  CO2 30 29  GLUCOSE 141* 136*  BUN 20 20  CREATININE 0.31* <0.30*  CALCIUM 7.5* 7.8*     Liver Panel   Recent Labs  09/04/14 0500  PROT 4.6*  ALBUMIN 1.7*  AST 22  ALT 12*  ALKPHOS 78  BILITOT 0.3       Sedimentation Rate No results for input(s): ESRSEDRATE in the last 72 hours. C-Reactive Protein No results for input(s): CRP in the last 72 hours.  Micro Results: Recent Results (from the past 720 hour(s))  Culture, blood (routine x 2)     Status: None   Collection Time: 08/26/14  7:05 AM  Result Value Ref Range Status   Specimen Description BLOOD RIGHT ARM  Final   Special Requests   Final    BOTTLES DRAWN AEROBIC AND ANAEROBIC 10CC BOTH BOTTLES   Culture   Final    NO GROWTH 5 DAYS Performed at [facility]    Report Status 09/01/2014 FINAL  Final  Culture, blood (routine x 2)     Status: None   Collection Time: 08/26/14  7:10 AM  Result Value Ref Range Status   Specimen Description BLOOD LEFT ARM  Final   Special Requests   Final    BOTTLES DRAWN AEROBIC AND ANAEROBIC 10CC BOTH BOTTLES   Culture   Final    NO GROWTH 5 DAYS Performed at [facility]    Report Status 09/01/2014 FINAL  Final  Surgical pcr screen     Status: None   Collection Time: 08/30/14 10:08 PM  Result Value Ref Range Status   MRSA, PCR NEGATIVE NEGATIVE Final   Staphylococcus aureus NEGATIVE NEGATIVE Final    Comment:        The Xpert SA Assay (FDA approved for NASAL specimens in patients over 11 years of age), is one component of a comprehensive surveillance program.  Test performance has been validated by [facility] for patients greater than or equal to 33 year old. It is not intended to diagnose infection nor to guide or monitor treatment.   Culture, blood (routine x 2)     Status: None (Preliminary result)   Collection Time: 09/04/14  7:58 AM  Result Value Ref Range Status   Specimen Description  BLOOD LEFT ARM  Final   Special Requests BOTTLES DRAWN AEROBIC ONLY 3CC  Final   Culture   Final           BLOOD CULTURE RECEIVED NO GROWTH TO DATE CULTURE WILL BE HELD FOR 5 DAYS BEFORE ISSUING A FINAL NEGATIVE REPORT Performed at [facility]    Report Status PENDING  Incomplete  Culture, blood (routine x 2)     Status: None (Preliminary result)   Collection Time: 09/04/14  8:00 AM  Result Value Ref  Range Status   Specimen Description RIGHT ANTECUBITAL  Final   Special Requests BOTTLES DRAWN AEROBIC AND ANAEROBIC 5CC  Final   Culture   Final           BLOOD CULTURE RECEIVED NO GROWTH TO DATE CULTURE WILL BE HELD FOR 5 DAYS BEFORE ISSUING A FINAL NEGATIVE REPORT Performed at [facility]    Report Status PENDING  Incomplete    Studies/Results: Dg Chest Port 1 View  09/04/2014   CLINICAL DATA:  Sepsis.  Fever, weakness and cough  EXAM: PORTABLE CHEST - 1 VIEW  COMPARISON:  09/02/2014  FINDINGS: There is a left subclavian catheter with tip in the projection of the SVC. Left IJ catheter is also noted with tip in the SVC. The nasogastric tube is in the stomach. There is no pleural effusion identified. New opacity is identified in the right base. Left lung appears clear.  IMPRESSION: 1. New right lung base opacity.   Electronically Signed   By: Kerby Moors M.D.   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 female with rectal cancer s/o stent and chemotherapy who developed a SBO with perforration and ex-lap on  6/2.  She has had persistent ileus, leukocytosis and fever.  Postoperative leukocytosis and fever in patient with recent SBO and perforation - Continue IV imipenem, would not expand coverage. - Consider CT abd/pelvis with PO and IV contrast if fevers and leukocytosis do not resolve.  Decubitus ulcer - Wound care   LOS: 11 days   Lucious Groves 09/05/2014, 11:35 AM

## 2014-09-05 NOTE — Progress Notes (Signed)
[facility] PROGRESS NOTE  Sydney Morrison BSW:[id number] DOB: [birth year] DOA: 08/25/2014 PCP: Delphina Cahill, MD   Brief narrative 58 y/o female with hx of rectal ca with liver metastasis, rectal stent placement in 06/2014 (under Dr. Gearldine Shown care), status post 3 cycles of chemotherapy, last cycle completed 08/16/2014 admitted on 5/27 with small bowel obstruction and failed conservative management. taken to OR on 6/2 for exploratory laparotomy with lower anterior rectosigmoid resection with colostomy. Hospital course prolonged due to slow improvement.     Assessment/Plan: Small bowel obstruction -Failed conservative management.  -OR  on 6/2 with  exploratory laparotomy with lower anterior rectosigmoid resection with colostomy. Also had appendectomy , hysterectomy and salpingoophorectomy, and debulking of peritoneum. -Hypotensive post op requiring aggressive IV hydration, central line placed and 2 units PRBC given for drop in H&H. - -On Dilaudid PCA for pain.. On TNA. When necessary Zofran for nausea. - Continue abdominal drain. PT eval.  -recommend foley for 7 days post op given bladder manipulation during surgery. ( until at least 6/9) -Ordered 1 unit PRBC for low H&H today.   Sepsis Patient febrile for the past 48 hrs with progressive leukocytosis. Repeat chest x-ray showing new right lung base opacity which might be the source. UA unremarkable. Discussed option for CT abdomen with surgery but given recent laparotomy is likely to have a lot of peritoneal fluid and free air which will be misleading.  Continue to monitor on Primaxin. (Day 11). Was on IV Flagyl  From 5/27-5/31. Follow WBC closely.  Appreciate ID consult. Recommend to continue Primaxin and obtain abdominal CT is fever and leukocytosis persist.  Rectal ca with liver mets  sees Dr Learta Codding. S/p 3 cycles of chemo. He will follow-up as outpatient.  Chest pain and Aflutter with RVR  on 5/30 cardiology consulted. 2D echo  with normal EF. replenished low k and magnesium. No further episodes. Neurology signed off.  Hypokalemia/ hypophsphatemia/ hypomagnesemia Replenish as needed  Thrombocytopenia  avoid heparin products. Monitor closely.  Stage II pressure ulcer No signs of infection, Pink  foam applied.  Tobacco abuse Continue nicotine patch.  DIET: NPO  DVT prophylaxis: SCDs   Code Status: full code Family Communication: Spoke with husband on 6/6. Disposition Plan: Continue stepdown monitoring. Hospital course prolonged due to slow progression of symptoms. Will likely be in the hospital for several days.   Consultants:  [facility]  Procedures:  OR on 6/2  Antibiotics:  primaxin since-- 5/27  HPI/Subjective: Seen and examined . Continues to have fever spike. Still has abdominal pain requiring Dilaudid PCA.  Objective: Filed Vitals:   09/05/14 1346  BP:   Pulse:   Temp: 99.6 F (37.6 C)  Resp:     Intake/Output Summary (Last 24 hours) at 09/05/14 1401 Last data filed at 09/05/14 1300  Gross per 24 hour  Intake 11947.67 ml  Output   2395 ml  Net 9552.67 ml   Filed Weights   09/01/14 0540 09/04/14 0400 09/05/14 0500  Weight: 54.3 kg (119 lb 11.4 oz) 55.4 kg (122 lb 2.2 oz) 56.3 kg (124 lb 1.9 oz)    Exam:   General:  thin built female lying  in bed fatigued,  HEENT: dry mucosa, pallor+, supple neck, NG tube+  Cardiovascular: NS1&S2, no murmurs  Respiratory: Diminished breath sounds over right lung base  Abdomen: laparotomy dressing, colostomy , abd drain in place. Bowel sounds present  Ext: warm, no edema, superficial pressure ulcer measuring 3x2 cm, no discharge or falls.   CNS: Fatigued,  alert  and oriented   Data Reviewed: Basic Metabolic Panel:  Recent Labs Lab 08/31/14 0600 08/31/14 2022 09/01/14 0545 09/02/14 0540 09/03/14 0500 09/04/14 0500  NA 143 139 141 139 136 135  K 2.9* 3.4* 3.7 3.0* 3.3* 3.7  CL 103 108 108 103 100* 99*  CO2 28 25 28  32 30  29  GLUCOSE 166* 248* 180* 128* 141* 136*  BUN 41* 29* 26* 20 20 20   CREATININE 0.46 0.74 0.51 0.35* 0.31* <0.30*  CALCIUM 9.4 7.5* 8.0* 7.8* 7.5* 7.8*  MG 2.3  --  1.4* 1.7 1.6* 1.5*  PHOS 3.0  --  2.7 2.4* 2.2* 2.6   Liver Function Tests:  Recent Labs Lab 08/30/14 0600 08/31/14 0600 09/04/14 0500  AST 26 22 22   ALT 16 16 12*  ALKPHOS 84 78 78  BILITOT 1.0 0.5 0.3  PROT 6.8 7.0 4.6*  ALBUMIN 3.5 3.5 1.7*   No results for input(s): LIPASE, AMYLASE in the last 168 hours. No results for input(s): AMMONIA in the last 168 hours. CBC:  Recent Labs Lab 08/30/14 0600  09/01/14 0545 09/02/14 0800 09/03/14 1000 09/04/14 0500 09/05/14 0400  WBC 4.7  < > 17.8* 23.8* 23.0* 25.2* 22.3*  NEUTROABS 1.3*  --   --   --   --  21.9*  --   HGB 10.4*  < > 9.0* 8.1* 7.2* 7.2* 6.8*  HCT 33.0*  < > 26.7* 25.0* 22.4* 22.4* 21.3*  MCV 83.3  < > 83.7 86.5 87.8 85.5 86.2  PLT 146*  < > 91* 77* 64* 72* 71*  < > = values in this interval not displayed. Cardiac Enzymes: No results for input(s): CKTOTAL, CKMB, CKMBINDEX, TROPONINI in the last 168 hours. BNP (last 3 results) No results for input(s): BNP in the last 8760 hours.  ProBNP (last 3 results) No results for input(s): PROBNP in the last 8760 hours.  CBG:  Recent Labs Lab 09/04/14 2005 09/04/14 2349 09/05/14 0359 09/05/14 0820 09/05/14 1345  GLUCAP 108* 135* 139* 132* 129*    Recent Results (from the past 240 hour(s))  Surgical pcr screen     Status: None   Collection Time: 08/30/14 10:08 PM  Result Value Ref Range Status   MRSA, PCR NEGATIVE NEGATIVE Final   Staphylococcus aureus NEGATIVE NEGATIVE Final    Comment:        The Xpert SA Assay (FDA approved for NASAL specimens in patients over 12 years of age), is one component of a comprehensive surveillance program.  Test performance has been validated by [facility] for patients greater than or equal to 64 year old. It is not intended to diagnose infection nor  to guide or monitor treatment.   Culture, blood (routine x 2)     Status: None (Preliminary result)   Collection Time: 09/04/14  7:58 AM  Result Value Ref Range Status   Specimen Description BLOOD LEFT ARM  Final   Special Requests BOTTLES DRAWN AEROBIC ONLY 3CC  Final   Culture   Final           BLOOD CULTURE RECEIVED NO GROWTH TO DATE CULTURE WILL BE HELD FOR 5 DAYS BEFORE ISSUING A FINAL NEGATIVE REPORT Performed at [facility]    Report Status PENDING  Incomplete  Culture, blood (routine x 2)     Status: None (Preliminary result)   Collection Time: 09/04/14  8:00 AM  Result Value Ref Range Status   Specimen Description RIGHT ANTECUBITAL  Final  Special Requests BOTTLES DRAWN AEROBIC AND ANAEROBIC 5CC  Final   Culture   Final           BLOOD CULTURE RECEIVED NO GROWTH TO DATE CULTURE WILL BE HELD FOR 5 DAYS BEFORE ISSUING A FINAL NEGATIVE REPORT Performed at [facility]    Report Status PENDING  Incomplete     Studies: Dg Chest Port 1 View  09/04/2014   CLINICAL DATA:  Sepsis.  Fever, weakness and cough  EXAM: PORTABLE CHEST - 1 VIEW  COMPARISON:  09/02/2014  FINDINGS: There is a left subclavian catheter with tip in the projection of the SVC. Left IJ catheter is also noted with tip in the SVC. The nasogastric tube is in the stomach. There is no pleural effusion identified. New opacity is identified in the right base. Left lung appears clear.  IMPRESSION: 1. New right lung base opacity.   Electronically Signed   By: Kerby Moors M.D.   On: 09/04/2014 15:16    Scheduled Meds: . sodium chloride   Intravenous Once  . antiseptic oral rinse  7 mL Mouth Rinse q12n4p  . chlorhexidine  15 mL Mouth Rinse BID  . HYDROmorphone PCA 0.3 mg/mL   Intravenous 6 times per day  . imipenem-cilastatin  250 mg Intravenous 4 times per day  . insulin aspart  0-15 Units Subcutaneous 6 times per day  . nicotine  21 mg Transdermal Daily  . pantoprazole (PROTONIX) IV  40 mg  Intravenous QHS  . scopolamine  1 patch Transdermal Q72H  . sodium chloride  10-40 mL Intracatheter Q12H  . sodium chloride  3 mL Intravenous Q12H   Continuous Infusions: . Marland KitchenTPN (CLINIMIX-E) Adult 60 mL/hr at 09/04/14 1757  . Marland KitchenTPN (CLINIMIX-E) Adult    . sodium chloride 75 mL/hr at 09/05/14 0405      Time spent: 35 minutes    Harshan Kearley, [facility] Pager [phone] If 7PM-7AM, please contact night-coverage at [email or web address], password [facility] 09/05/2014, 2:01 PM  LOS: 11 days

## 2014-09-05 NOTE — Progress Notes (Signed)
CRITICAL VALUE ALERT  Critical value received:  Hgb 6.8  Date of notification:  09-05-14  Time of notification:  04:45  Critical value read back:Yes.    Nurse who received alert:  Lenox Ahr  MD notified (1st page):  Raliegh Ip Schorr  Time of first page:  04:51  MD notified (2nd page): N/A  Time of second page: N/A  Responding MD:  Lamar Blinks  Time MD responded:  05:00

## 2014-09-05 NOTE — Progress Notes (Signed)
ANTIBIOTIC CONSULT NOTE - FOLLOW UP  Pharmacy Consult for primaxin Indication: Intra-abdominal Infection  Allergies  Allergen Reactions  . Avelox [Moxifloxacin Hcl In Nacl] Hives  . Codeine Hives  . Dextromethorphan Hives  . Erythromycin Hives  . Penicillins Hives    No problem with Imipenem  . Suprep [Na Sulfate-K Sulfate-Mg Sulf] Nausea And Vomiting    Patient Measurements: Height: 5\' 7"  (170.2 cm) Weight: 124 lb 1.9 oz (56.3 kg) IBW/kg (Calculated) : 61.6   Vital Signs: Temp: 98.6 F (37 C) (06/07 0400) Temp Source: Oral (06/07 0400) BP: 107/53 mmHg (06/07 0600) Intake/Output from previous day: 06/06 0701 - 06/07 0700 In: 4800.2 [I.V.:1725; NG/GT:360; IV Piggyback:903; TPN:1332.2] Out: 3305 [Urine:3150; Emesis/NG output:100; Drains:45; Stool:10] Intake/Output from this shift:    Labs:  Recent Labs  09/03/14 0500 09/03/14 1000 09/04/14 0500 09/05/14 0400  WBC  --  23.0* 25.2* 22.3*  HGB  --  7.2* 7.2* 6.8*  PLT  --  64* 72* 71*  CREATININE 0.31*  --  <0.30*  --    CrCl cannot be calculated (Patient has no serum creatinine result on file.). No results for input(s): VANCOTROUGH, VANCOPEAK, VANCORANDOM, GENTTROUGH, GENTPEAK, GENTRANDOM, TOBRATROUGH, TOBRAPEAK, TOBRARND, AMIKACINPEAK, AMIKACINTROU, AMIKACIN in the last 72 hours.   Microbiology: Recent Results (from the past 720 hour(s))  Culture, blood (routine x 2)     Status: None   Collection Time: 08/26/14  7:05 AM  Result Value Ref Range Status   Specimen Description BLOOD RIGHT ARM  Final   Special Requests   Final    BOTTLES DRAWN AEROBIC AND ANAEROBIC 10CC BOTH BOTTLES   Culture   Final    NO GROWTH 5 DAYS Performed at [facility]    Report Status 09/01/2014 FINAL  Final  Culture, blood (routine x 2)     Status: None   Collection Time: 08/26/14  7:10 AM  Result Value Ref Range Status   Specimen Description BLOOD LEFT ARM  Final   Special Requests   Final    BOTTLES DRAWN AEROBIC AND  ANAEROBIC 10CC BOTH BOTTLES   Culture   Final    NO GROWTH 5 DAYS Performed at [facility]    Report Status 09/01/2014 FINAL  Final  Surgical pcr screen     Status: None   Collection Time: 08/30/14 10:08 PM  Result Value Ref Range Status   MRSA, PCR NEGATIVE NEGATIVE Final   Staphylococcus aureus NEGATIVE NEGATIVE Final    Comment:        The Xpert SA Assay (FDA approved for NASAL specimens in patients over 50 years of age), is one component of a comprehensive surveillance program.  Test performance has been validated by [facility] for patients greater than or equal to 11 year old. It is not intended to diagnose infection nor to guide or monitor treatment.     Anti-infectives    Start     Dose/Rate Route Frequency Ordered Stop   09/01/14 1200  imipenem-cilastatin (PRIMAXIN) 250 mg in sodium chloride 0.9 % 100 mL IVPB     250 mg 200 mL/hr over 30 Minutes Intravenous 4 times per day 09/01/14 1051     08/31/14 1030  clindamycin (CLEOCIN) 900 mg, gentamicin (GARAMYCIN) 240 mg in sodium chloride 0.9 % 1,000 mL for intraperitoneal lavage  Status:  Discontinued      Intraperitoneal To Surgery 08/31/14 1023 08/31/14 1513   08/31/14 0600  clindamycin (CLEOCIN) IVPB 900 mg     900 mg 100  mL/hr over 30 Minutes Intravenous 60 min pre-op 08/30/14 1218 08/31/14 0944   08/31/14 0600  [MAR Hold]  gentamicin (GARAMYCIN) 220 mg in dextrose 5 % 100 mL IVPB     (MAR Hold since 08/31/14 1010)   5 mg/kg  44.1 kg 105.5 mL/hr over 60 Minutes Intravenous 60 min pre-op 08/30/14 1218 08/31/14 1005   08/30/14 2100  imipenem-cilastatin (PRIMAXIN) 250 mg in sodium chloride 0.9 % 100 mL IVPB  Status:  Discontinued     250 mg 200 mL/hr over 30 Minutes Intravenous 3 times per day 08/30/14 1739 09/01/14 1051   08/25/14 1800  imipenem-cilastatin (PRIMAXIN) 250 mg in sodium chloride 0.9 % 100 mL IVPB  Status:  Discontinued     250 mg 200 mL/hr over 30 Minutes Intravenous Every 6 hours 08/25/14  1716 08/30/14 1739   08/25/14 1800  metroNIDAZOLE (FLAGYL) IVPB 500 mg  Status:  Discontinued     500 mg 100 mL/hr over 60 Minutes Intravenous Every 8 hours 08/25/14 1716 08/27/14 1003      Assessment: Patient's a 59 y.o F with rectal cancer currently undergoing chemotherapy treatment.  She's now s/p exp lap with small bowel and low anterior rectosigmoid resections/stent with colostomy, appendectomy, hysterectomy, omentectomy, and debulking of peritoneum on 6/2. TPN started on 6/1.  She's currently on primaxin day #12 of antibiotic, but continues to be febrile with leukocytosis.  ID recommends to continue with primaxin for now and to repeat CT abd and pelvis to r/o abscesses if fever and leukocytosis don't resolve. Of note, CXR on 6/6 showed new right lung base opacity.  5/27 flagyl >> 5/29 5/27 primaxin >>  Micro: 5/28 bc x2: neg FINAL 5/6 bcx x2:   Plan:  - continue primaxin 250 mg IV q6h - f/u cultures and clinical status  Sydney Morrison P 09/05/2014,7:40 AM

## 2014-09-06 DIAGNOSIS — K631 Perforation of intestine (nontraumatic): Secondary | ICD-10-CM

## 2014-09-06 DIAGNOSIS — Z931 Gastrostomy status: Secondary | ICD-10-CM

## 2014-09-06 DIAGNOSIS — R5082 Postprocedural fever: Secondary | ICD-10-CM

## 2014-09-06 DIAGNOSIS — D72829 Elevated white blood cell count, unspecified: Secondary | ICD-10-CM

## 2014-09-06 LAB — CBC
HEMATOCRIT: 23.5 % — AB (ref 36.0–46.0)
HEMOGLOBIN: 7.6 g/dL — AB (ref 12.0–15.0)
MCH: 27.8 pg (ref 26.0–34.0)
MCHC: 32.3 g/dL (ref 30.0–36.0)
MCV: 86.1 fL (ref 78.0–100.0)
PLATELETS: 81 10*3/uL — AB (ref 150–400)
RBC: 2.73 MIL/uL — AB (ref 3.87–5.11)
RDW: 17 % — ABNORMAL HIGH (ref 11.5–15.5)
WBC: 11.5 10*3/uL — ABNORMAL HIGH (ref 4.0–10.5)

## 2014-09-06 LAB — GLUCOSE, CAPILLARY
GLUCOSE-CAPILLARY: 122 mg/dL — AB (ref 65–99)
GLUCOSE-CAPILLARY: 111 mg/dL — AB (ref 65–99)
GLUCOSE-CAPILLARY: 126 mg/dL — AB (ref 65–99)
GLUCOSE-CAPILLARY: 132 mg/dL — AB (ref 65–99)
Glucose-Capillary: 104 mg/dL — ABNORMAL HIGH (ref 65–99)

## 2014-09-06 LAB — BASIC METABOLIC PANEL
ANION GAP: 7 (ref 5–15)
BUN: 12 mg/dL (ref 6–20)
CALCIUM: 7.7 mg/dL — AB (ref 8.9–10.3)
CO2: 26 mmol/L (ref 22–32)
CREATININE: 0.37 mg/dL — AB (ref 0.44–1.00)
Chloride: 99 mmol/L — ABNORMAL LOW (ref 101–111)
GFR calc Af Amer: >60 mL/min (ref >60)
GFR calc non Af Amer: >60 mL/min (ref >60)
Glucose, Bld: 116 mg/dL — ABNORMAL HIGH (ref 65–99)
Potassium: 3.5 mmol/L (ref 3.5–5.1)
SODIUM: 132 mmol/L — AB (ref 135–145)

## 2014-09-06 LAB — TYPE AND SCREEN
ABO/RH(D): A POS
Antibody Screen: NEGATIVE
Unit division: 0

## 2014-09-06 LAB — MAGNESIUM: Magnesium: 1.5 mg/dL — ABNORMAL LOW (ref 1.7–2.4)

## 2014-09-06 LAB — HIV ANTIBODY (ROUTINE TESTING W REFLEX): HIV Screen 4th Generation wRfx: NONREACTIVE

## 2014-09-06 MED ORDER — POTASSIUM CHLORIDE 10 MEQ/50ML IV SOLN
10.0000 meq | INTRAVENOUS | Status: AC
  Administered 2014-09-06 (×2): 10 meq via INTRAVENOUS
  Filled 2014-09-06 (×2): qty 50

## 2014-09-06 MED ORDER — TRACE MINERALS CR-CU-MN-SE-ZN 10-1000-500-60 MCG/ML IV SOLN
INTRAVENOUS | Status: AC
  Administered 2014-09-06: 17:00:00 via INTRAVENOUS
  Filled 2014-09-06: qty 1440

## 2014-09-06 MED ORDER — MAGNESIUM SULFATE IN D5W 10-5 MG/ML-% IV SOLN
1.0000 g | Freq: Once | INTRAVENOUS | Status: AC
  Administered 2014-09-06: 1 g via INTRAVENOUS
  Filled 2014-09-06: qty 100

## 2014-09-06 NOTE — Progress Notes (Signed)
Pt needs side wise mattress for pressure redistribution when she transfers out of stepdown . She is on  The ICU bed that has this mattress.

## 2014-09-06 NOTE — Progress Notes (Signed)
PARENTERAL NUTRITION CONSULT NOTE - Follow-up  Pharmacy Consult for TPN Indication: Persistent SBO  Allergies  Allergen Reactions  . Avelox [Moxifloxacin Hcl In Nacl] Hives  . Codeine Hives  . Dextromethorphan Hives  . Erythromycin Hives  . Penicillins Hives    No problem with Imipenem  . Suprep [Na Sulfate-K Sulfate-Mg Sulf] Nausea And Vomiting    Patient Measurements: Height: 5' 7"  (170.2 cm) Weight: 116 lb 13.5 oz (53 kg) IBW/kg (Calculated) : 61.6 Usual Weight: 50.4 kg  Vital Signs: Temp: 98.9 F (37.2 C) (06/08 0400) Temp Source: Oral (06/08 0400) BP: 144/64 mmHg (06/08 0800) Intake/Output from previous day: 06/07 0701 - 06/08 0700 In: 34193 [I.V.:9350; Blood:365; IV Piggyback:1200; TPN:1439] Out: 7902 [Urine:4425; Drains:15]  Labs:  Recent Labs  09/04/14 0500 09/05/14 0400 09/06/14 0445  WBC 25.2* 22.3* 11.5*  HGB 7.2* 6.8* 7.6*  HCT 22.4* 21.3* 23.5*  PLT 72* 71* 81*     Recent Labs  09/04/14 0500 09/06/14 0445  NA 135 132*  K 3.7 3.5  CL 99* 99*  CO2 29 26  GLUCOSE 136* 116*  BUN 20 12  CREATININE <0.30* 0.37*  CALCIUM 7.8* 7.7*  MG 1.5* 1.5*  PHOS 2.6  --   PROT 4.6*  --   ALBUMIN 1.7*  --   AST 22  --   ALT 12*  --   ALKPHOS 78  --   BILITOT 0.3  --   PREALBUMIN 6.0*  --   TRIG 74  --    Estimated Creatinine Clearance: 64.9 mL/min (by C-G formula based on Cr of 0.37).    Recent Labs  09/05/14 2004 09/05/14 2332 09/06/14 0406  GLUCAP 122* 126* 122*   Medications, infusions:  . Marland KitchenTPN (CLINIMIX-E) Adult 60 mL/hr at 09/05/14 1738  . sodium chloride 75 mL/hr at 09/06/14 0801   Insulin Requirements: Moderate SSI q4h: 10 units / 24 hours.  Current Nutrition: NPO  IVF: NS at 75 ml/hr  Central access: CVC port L chest TPN start date: 6/1  ASSESSMENT                                                                                                          HPI: 46yoF with PMH metastatic rectal cancer and rectal stent 06/2014, s/p  3 cycles chemo presents 5/27 with N/V x 1 day.  Found to have SBO, likely secondary to pelvic tumor on CT.  NG tube placed and later removed once SBO improved on Abd XR.  Started on CL diet, but persistent N/V prevent meaningful PO intake.  To start on TPN per pharmacy for nutritional supplementation until tolerating PO.  Significant events:  5/31: patient misunderstood Surgeon to imply that TPN was not going to start today and did not allow TPN to be initiated. 6/2: s/p exp laparotomy, SB resection, LAR, stent, colostomy, appendectomy, hysterectomy, salpingo-oophorectomy, transverse colon resection, debulking of peritoneum. 6/6: clamp NG tube 6/7: NGT d/c'ed  Today:   Glucose (goal <150): No Hx DM.  cbgs <150  Electrolytes: Na slightly low at 132 (unable to adjust  in premixed Clinimix bag), Mag low at 1.5, K at low end of normal range at 3.5, Corrected Ca WNL  Renal: SCr low  LFTs (6/6): AST WNL, ALT low, Alk Phos and Tbili WNL   TGs: elevated at baseline with 226 (6/1), 74 (6/6)  Prealbumin: 13.2 (6/1), 6 (6/6)  NUTRITIONAL GOALS                                                                                             RD recs: Nutrition needs: 1600-1800 kcal, 70-80 grams protein.  Per new ASPEN and SCCM guidelines, HOLD lipids during the first seven days of therapy while pt meets ICU status. Goal will be to meet 100% of the patient's protein needs and approximately 80% of their caloric needs.  Clinimix 5/20 @ 60 mL/hr to provide 72 grams protein (100% of goal) and 1267 kcal (79% of goal)  PLAN                                                                                                                          Magnesium Sulfate 1g IV x 1  KCl 10 mEq IV x 2 runs At 1800 today:  Continue Clinimix E 5/20 IV at 60 ml/hr.  Hold IV lipids for first 7 days while in ICU per protocol (6/3-6/9)  TPN to contain standard multivitamins and trace elements.  IVF per MD orders, NS  at 75 ml/hr.    Continue CBGs and moderate SSI q4h   TPN lab panels on Mondays & Thursdays.   Lindell Spar, PharmD, BCPS Pager: [phone] 09/06/2014 9:01 AM

## 2014-09-06 NOTE — Consult Note (Addendum)
WOC wound consult note Reason for Consult: HAPU  Unstageable pressure ulcer to sacrum.  Patient has been very sedated and no PO intake for several days.  Wound type:Pressure ulcer,  Pressure Ulcer POA: No Measurement:6.5 cm x 6 cm x 0.1 cm maroon wound bed, top layer of epithelium has sloughed off.  Full thickness injury Wound bed:100% maroon discoloration.  Nonintact Drainage (amount, consistency, odor) Minimal serosanguinous drainage. No odor.  Periwound:Intact Dressing procedure/placement/frequency:Will order LALM for pressure redistribution.  Cleanse sacral ulcer with NS and pat gently dry. Allevyn silicone border foam dressing.  Change every other day.  Encourage patient to turn and reposition every 2 hours.  Will not follow at this time.  Please re-consult if needed.  Domenic Moras RN BSN CWON Pager [phone]     [place] ostomy consult note Stoma type/location: LLQ Colostomy, edematous, pink moist and viable.  No stool output at this time.  Stomal assessment/size: 2" round edematous and moist.  Peristomal assessment: Intact Treatment options for stomal/peristomal skin: None Output Only bloody liquid in the pouch at this time.  Ostomy pouching: 2pc. 2 3/4" pouch  Education provided: Patient participated in care today.  Husband was not present.  Stoma was measured and patient able to cut barrier to fit.  Has neuropathy and states this is challenging for her.  Pouch applied.  Reviewed roll closure and patient able to return demonstrate this skill.   Discussed cleansing, frequency of pouch changing and emptying when 1/3 full. Agrees to secure start. Will enroll today.  Enrolled patient in [place] program: Yes

## 2014-09-06 NOTE — Progress Notes (Signed)
[facility] PROGRESS NOTE  Sydney Morrison JEH:[id number] DOB: 12-11-1955 DOA: 08/25/2014 PCP: Delphina Cahill, MD  Assessment/Plan: Small bowel obstruction -Failed conservative management.  -Surgery followed and pt underwent exploratory laparotomy on 6/2 with lower anterior rectosigmoid resection with colostomy.  -Pt is s/p appendectomy, hysterectomy, and salpingoophorectomy, and debulking of peritoneum. -Earlier noted to be hypotensive, improved with aggressive IVF -On Dilaudid PCA for pain. On TNA.  - Continue abdominal drain. PT eval.  -recommend foley for 7 days post op given bladder manipulation during surgery. ( until at least 6/9) -Continue ambulation as tolerated per Surgery  Sepsis -Patient febrile recently with progressive leukocytosis.  -Repeat chest x-ray showing new right lung base opacity -UA unremarkable -Had continued on Primaxin. Was on IV Flagyl From 5/27-5/31. -ID since consulted. Discussed with ID with recommendations to d/c abx as of 6/8 -If sepsis worsens, ID recommends CT abd and to resume abx at that time  Rectal ca with liver mets -sees Dr Benay Spice. S/p 3 cycles of chemo.  -Dr. Benay Spice to follow-up as outpatient.  Chest pain and Aflutter with RVR -cardiology was consulted.  - 2D echo with normal EF - Cardiology has since signed off.  Hypokalemia/ hypophsphatemia/ hypomagnesemia - Replace as needed - Cont to monitor  Thrombocytopenia -avoid heparin products. Monitor closely.  Stage II pressure ulcer - No signs of infection - Pink foam applied.  Tobacco abuse - Continue nicotine patch.  Code Status: Full Family Communication: Pt in room, family at bedside Disposition Plan: Pending  Consultants:  General Surgery  Cardiology  Infectious Disease  Procedures: 6/2: EXPLORATORY LAPAROTOMY SMALL BOWEL RESECTION LOW ANTERIOR RECTOSIGMOID RESECTION (including stent) with COLOSTOMY APPENDECTOMY HYSTERECTOMY SUPRACERVICAL ABDOMINAL  Salpingo-oophorectomy TRANSVERSE COLON RESECTION WITH PARTIAL OMENTECTOMY DEBULKING OF PERITONEUM  Antibiotics:  Primaxin 5/27>>>6/8  HPI/Subjective: Complains of mild post-op pain.  Objective: Filed Vitals:   09/06/14 1200 09/06/14 1230 09/06/14 1400 09/06/14 1600  BP: 134/54  146/61   Pulse:      Temp: 98.7 F (37.1 C)   98.7 F (37.1 C)  TempSrc: Oral   Oral  Resp: 20 21 20    Height:      Weight:      SpO2: 98% 98% 99%     Intake/Output Summary (Last 24 hours) at 09/06/14 1705 Last data filed at 09/06/14 1500  Gross per 24 hour  Intake   4569 ml  Output   4110 ml  Net    459 ml   Filed Weights   09/04/14 0400 09/05/14 0500 09/06/14 0500  Weight: 55.4 kg (122 lb 2.2 oz) 56.3 kg (124 lb 1.9 oz) 53 kg (116 lb 13.5 oz)    Exam:   General:  Awake, in nad  Cardiovascular: regular, s1, s2  Respiratory: normal resp effort, no wheezing  Abdomen: soft,nondistended  Musculoskeletal: perfused, no clubbing   Data Reviewed: Basic Metabolic Panel:  Recent Labs Lab 08/31/14 0600  09/01/14 0545 09/02/14 0540 09/03/14 0500 09/04/14 0500 09/06/14 0445  NA 143  < > 141 139 136 135 132*  K 2.9*  < > 3.7 3.0* 3.3* 3.7 3.5  CL 103  < > 108 103 100* 99* 99*  CO2 28  < > 28 32 30 29 26   GLUCOSE 166*  < > 180* 128* 141* 136* 116*  BUN 41*  < > 26* 20 20 20 12   CREATININE 0.46  < > 0.51 0.35* 0.31* <0.30* 0.37*  CALCIUM 9.4  < > 8.0* 7.8* 7.5* 7.8* 7.7*  MG 2.3  --  1.4* 1.7 1.6* 1.5* 1.5*  PHOS 3.0  --  2.7 2.4* 2.2* 2.6  --   < > = values in this interval not displayed. Liver Function Tests:  Recent Labs Lab 08/31/14 0600 09/04/14 0500  AST 22 22  ALT 16 12*  ALKPHOS 78 78  BILITOT 0.5 0.3  PROT 7.0 4.6*  ALBUMIN 3.5 1.7*   No results for input(s): LIPASE, AMYLASE in the last 168 hours. No results for input(s): AMMONIA in the last 168 hours. CBC:  Recent Labs Lab 09/02/14 0800 09/03/14 1000 09/04/14 0500 09/05/14 0400 09/06/14 0445  WBC 23.8*  23.0* 25.2* 22.3* 11.5*  NEUTROABS  --   --  21.9*  --   --   HGB 8.1* 7.2* 7.2* 6.8* 7.6*  HCT 25.0* 22.4* 22.4* 21.3* 23.5*  MCV 86.5 87.8 85.5 86.2 86.1  PLT 77* 64* 72* 71* 81*   Cardiac Enzymes: No results for input(s): CKTOTAL, CKMB, CKMBINDEX, TROPONINI in the last 168 hours. BNP (last 3 results) No results for input(s): BNP in the last 8760 hours.  ProBNP (last 3 results) No results for input(s): PROBNP in the last 8760 hours.  CBG:  Recent Labs Lab 09/05/14 2332 09/06/14 0406 09/06/14 0740 09/06/14 1220 09/06/14 1619  GLUCAP 126* 122* 111* 126* 132*    Recent Results (from the past 240 hour(s))  Surgical pcr screen     Status: None   Collection Time: 08/30/14 10:08 PM  Result Value Ref Range Status   MRSA, PCR NEGATIVE NEGATIVE Final   Staphylococcus aureus NEGATIVE NEGATIVE Final    Comment:        The Xpert SA Assay (FDA approved for NASAL specimens in patients over 72 years of age), is one component of a comprehensive surveillance program.  Test performance has been validated by [facility] for patients greater than or equal to 35 year old. It is not intended to diagnose infection nor to guide or monitor treatment.   Culture, blood (routine x 2)     Status: None (Preliminary result)   Collection Time: 09/04/14  7:58 AM  Result Value Ref Range Status   Specimen Description BLOOD LEFT ARM  Final   Special Requests BOTTLES DRAWN AEROBIC ONLY 3CC  Final   Culture   Final           BLOOD CULTURE RECEIVED NO GROWTH TO DATE CULTURE WILL BE HELD FOR 5 DAYS BEFORE ISSUING A FINAL NEGATIVE REPORT Performed at [facility]    Report Status PENDING  Incomplete  Culture, blood (routine x 2)     Status: None (Preliminary result)   Collection Time: 09/04/14  8:00 AM  Result Value Ref Range Status   Specimen Description RIGHT ANTECUBITAL  Final   Special Requests BOTTLES DRAWN AEROBIC AND ANAEROBIC 5CC  Final   Culture   Final           BLOOD CULTURE  RECEIVED NO GROWTH TO DATE CULTURE WILL BE HELD FOR 5 DAYS BEFORE ISSUING A FINAL NEGATIVE REPORT Performed at [facility]    Report Status PENDING  Incomplete     Studies: No results found.  Scheduled Meds: . antiseptic oral rinse  7 mL Mouth Rinse q12n4p  . chlorhexidine  15 mL Mouth Rinse BID  . HYDROmorphone PCA 0.3 mg/mL   Intravenous 6 times per day  . imipenem-cilastatin  250 mg Intravenous 4 times per day  . insulin aspart  0-15 Units Subcutaneous 6 times per day  . nicotine  21 mg Transdermal Daily  . pantoprazole (PROTONIX) IV  40 mg Intravenous QHS  . scopolamine  1 patch Transdermal Q72H  . sodium chloride  10-40 mL Intracatheter Q12H  . sodium chloride  3 mL Intravenous Q12H   Continuous Infusions: . Marland KitchenTPN (CLINIMIX-E) Adult 60 mL/hr at 09/05/14 1738  . Marland KitchenTPN (CLINIMIX-E) Adult    . 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 If 7PM-7AM, please contact night-coverage at [email or web address], password [facility] 09/06/2014, 5:05 PM  LOS: 12 days

## 2014-09-06 NOTE — Progress Notes (Signed)
[place] [facility]    Subjective: Patient reports she is doing better, no emesis after NGT removed.  Has tolerated some Ice chips so far.  She is predominatly focused on possible plans for a PICC line.  Notes abdomen still tender but slightly better.  Remains afebrile overnight.   Antibiotics:  Anti-infectives    Start     Dose/Rate Route Frequency Ordered Stop   09/01/14 1200  imipenem-cilastatin (PRIMAXIN) 250 mg in sodium chloride 0.9 % 100 mL IVPB     250 mg 200 mL/hr over 30 Minutes Intravenous 4 times per day 09/01/14 1051     08/31/14 1030  clindamycin (CLEOCIN) 900 mg, gentamicin (GARAMYCIN) 240 mg in sodium chloride 0.9 % 1,000 mL for intraperitoneal lavage  Status:  Discontinued      Intraperitoneal To Surgery 08/31/14 1023 08/31/14 1513   08/31/14 0600  clindamycin (CLEOCIN) IVPB 900 mg     900 mg 100 mL/hr over 30 Minutes Intravenous 60 min pre-op 08/30/14 1218 08/31/14 0944   08/31/14 0600  [MAR Hold]  gentamicin (GARAMYCIN) 220 mg in dextrose 5 % 100 mL IVPB     (MAR Hold since 08/31/14 1010)   5 mg/kg  44.1 kg 105.5 mL/hr over 60 Minutes Intravenous 60 min pre-op 08/30/14 1218 08/31/14 1005   08/30/14 2100  imipenem-cilastatin (PRIMAXIN) 250 mg in sodium chloride 0.9 % 100 mL IVPB  Status:  Discontinued     250 mg 200 mL/hr over 30 Minutes Intravenous 3 times per day 08/30/14 1739 09/01/14 1051   08/25/14 1800  imipenem-cilastatin (PRIMAXIN) 250 mg in sodium chloride 0.9 % 100 mL IVPB  Status:  Discontinued     250 mg 200 mL/hr over 30 Minutes Intravenous Every 6 hours 08/25/14 1716 08/30/14 1739   08/25/14 1800  metroNIDAZOLE (FLAGYL) IVPB 500 mg  Status:  Discontinued     500 mg 100 mL/hr over 60 Minutes Intravenous Every 8 hours 08/25/14 1716 08/27/14 1003      Medications: Scheduled Meds: . antiseptic oral rinse  7 mL Mouth Rinse q12n4p  . chlorhexidine  15 mL Mouth Rinse BID  . HYDROmorphone PCA 0.3 mg/mL   Intravenous 6 times per day   . imipenem-cilastatin  250 mg Intravenous 4 times per day  . insulin aspart  0-15 Units Subcutaneous 6 times per day  . nicotine  21 mg Transdermal Daily  . pantoprazole (PROTONIX) IV  40 mg Intravenous QHS  . scopolamine  1 patch Transdermal Q72H  . sodium chloride  10-40 mL Intracatheter Q12H  . sodium chloride  3 mL Intravenous Q12H   Continuous Infusions: . Marland KitchenTPN (CLINIMIX-E) Adult 60 mL/hr at 09/05/14 1738  . Marland KitchenTPN (CLINIMIX-E) Adult    . sodium chloride 75 mL/hr at 09/06/14 0801   PRN Meds:.acetaminophen **OR** acetaminophen, diphenhydrAMINE **OR** diphenhydrAMINE, HYDROmorphone (DILAUDID) injection, LORazepam, metoCLOPramide, naloxone **AND** sodium chloride, nitroGLYCERIN, ondansetron (ZOFRAN) IV **OR** ondansetron (ZOFRAN) IV, ondansetron (ZOFRAN) IV, ondansetron, phenol, promethazine, sodium chloride    Objective: Weight change: -7 lb 4.4 oz (-3.3 kg)  Intake/Output Summary (Last 24 hours) at 09/06/14 1547 Last data filed at 09/06/14 1500  Gross per 24 hour  Intake   4839 ml  Output   4960 ml  Net   -121 ml   Blood pressure 146/61, pulse 87, temperature 98.7 F (37.1 C), temperature source Oral, resp. rate 20, height 5\' 7"  (1.702 m), weight 116 lb 13.5 oz (53 kg), SpO2 99 %. Temp:  [98.5 F (36.9 C)-100.1 F (  37.8 C)] 98.7 F (37.1 C) (06/08 1200) Resp:  [15-24] 20 (06/08 1400) BP: (121-151)/(46-75) 146/61 mmHg (06/08 1400) SpO2:  [94 %-99 %] 99 % (06/08 1400) Weight:  [116 lb 13.5 oz (53 kg)] 116 lb 13.5 oz (53 kg) (06/08 0500)  Physical Exam: General: resting in bed in NAD Cardiac: RRR, no rubs, murmurs or gallops Pulm: clear to auscultation bilaterally from anterior lung fields Abd: soft, bandaged, diffusely tender, ostomy bag in place, decreased bowel sounds Ext: warm and well perfused, no pedal edema Neuro: alert and oriented X3  CBC: CBC Latest Ref Rng 09/06/2014 09/05/2014 09/04/2014  WBC 4.0 - 10.5 K/uL 11.5(H) 22.3(H) 25.2(H)  Hemoglobin 12.0 - 15.0 g/dL  7.6(L) 6.8(LL) 7.2(L)  Hematocrit 36.0 - 46.0 % 23.5(L) 21.3(L) 22.4(L)  Platelets 150 - 400 K/uL 81(L) 71(L) 72(L)      BMET  Recent Labs  09/04/14 0500 09/06/14 0445  NA 135 132*  K 3.7 3.5  CL 99* 99*  CO2 29 26  GLUCOSE 136* 116*  BUN 20 12  CREATININE <0.30* 0.37*  CALCIUM 7.8* 7.7*     Liver Panel   Recent Labs  09/04/14 0500  PROT 4.6*  ALBUMIN 1.7*  AST 22  ALT 12*  ALKPHOS 78  BILITOT 0.3       Sedimentation Rate No results for input(s): ESRSEDRATE in the last 72 hours. C-Reactive Protein No results for input(s): CRP in the last 72 hours.  Micro Results: Recent Results (from the past 720 hour(s))  Culture, blood (routine x 2)     Status: None   Collection Time: 08/26/14  7:05 AM  Result Value Ref Range Status   Specimen Description BLOOD RIGHT ARM  Final   Special Requests   Final    BOTTLES DRAWN AEROBIC AND ANAEROBIC 10CC BOTH BOTTLES   Culture   Final    NO GROWTH 5 DAYS Performed at [facility]    Report Status 09/01/2014 FINAL  Final  Culture, blood (routine x 2)     Status: None   Collection Time: 08/26/14  7:10 AM  Result Value Ref Range Status   Specimen Description BLOOD LEFT ARM  Final   Special Requests   Final    BOTTLES DRAWN AEROBIC AND ANAEROBIC 10CC BOTH BOTTLES   Culture   Final    NO GROWTH 5 DAYS Performed at [facility]    Report Status 09/01/2014 FINAL  Final  Surgical pcr screen     Status: None   Collection Time: 08/30/14 10:08 PM  Result Value Ref Range Status   MRSA, PCR NEGATIVE NEGATIVE Final   Staphylococcus aureus NEGATIVE NEGATIVE Final    Comment:        The Xpert SA Assay (FDA approved for NASAL specimens in patients over 62 years of age), is one component of a comprehensive surveillance program.  Test performance has been validated by [facility] for patients greater than or equal to 33 year old. It is not intended to diagnose infection nor to guide or monitor treatment.    Culture, blood (routine x 2)     Status: None (Preliminary result)   Collection Time: 09/04/14  7:58 AM  Result Value Ref Range Status   Specimen Description BLOOD LEFT ARM  Final   Special Requests BOTTLES DRAWN AEROBIC ONLY 3CC  Final   Culture   Final           BLOOD CULTURE RECEIVED NO GROWTH TO DATE CULTURE WILL BE HELD FOR  5 DAYS BEFORE ISSUING A FINAL NEGATIVE REPORT Performed at [facility]    Report Status PENDING  Incomplete  Culture, blood (routine x 2)     Status: None (Preliminary result)   Collection Time: 09/04/14  8:00 AM  Result Value Ref Range Status   Specimen Description RIGHT ANTECUBITAL  Final   Special Requests BOTTLES DRAWN AEROBIC AND ANAEROBIC 5CC  Final   Culture   Final           BLOOD CULTURE RECEIVED NO GROWTH TO DATE CULTURE WILL BE HELD FOR 5 DAYS BEFORE ISSUING A FINAL NEGATIVE REPORT Performed at [facility]    Report Status PENDING  Incomplete    Studies/Results: No results found.    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 female with rectal cancer s/o stent and chemotherapy who developed a SBO with perforration and ex-lap on 6/2.  She has had persistent ileus, leukocytosis and fever.  Postoperative leukocytosis and fever in patient with recent SBO and perforation - Leukocytosis almost resolved and patient remains afebrile overnight.  Will D/C Abx.   If leukocytosis tends up or she become febrile this would suggest untreated infection and the likely source would be the  abdomen, so we would need a repeat CT abd/pelvis with PO and IV contrast.  Decubitus ulcer - Appreciate Wound care's assistance   LOS: 12 days   Lucious Groves 09/06/2014, 3:47 PM

## 2014-09-06 NOTE — Plan of Care (Signed)
Problem: Phase I Progression Outcomes Goal: Tubes/drains patent Outcome: Progressing Jp  Intact but is slightly red at site.Will Creig Hines  Updated.

## 2014-09-06 NOTE — Progress Notes (Signed)
6 Days Post-Op  Subjective: She is doing pretty well, some sweat in the ostomy bag and few bowel sounds.    Objective: Vital signs in last 24 hours: Temp:  [98.5 F (36.9 C)-100.1 F (37.8 C)] 98.5 F (36.9 C) (06/08 0800) Resp:  [15-24] 21 (06/08 0800) BP: (121-151)/(46-67) 144/64 mmHg (06/08 0800) SpO2:  [94 %-98 %] 96 % (06/08 0800) Weight:  [53 kg (116 lb 13.5 oz)] 53 kg (116 lb 13.5 oz) (06/08 0500) Last BM Date: 09/04/14 Ice chips yesterday  PO nothing recorded Good urine output Transfused TNA Afebrile, VSS Na 132, WBC improving anemia and platelets stable. Intake/Output from previous day: 06/07 0701 - 06/08 0700 In: 36644 [I.V.:9350; Blood:365; IV Piggyback:1200; TPN:1439] Out: 0347 [Urine:4425; Drains:15] Intake/Output this shift: Total I/O In: 930 [I.V.:150; Other:20; IV Piggyback:100; TPN:660] Out: 600 [Urine:600]  General appearance: alert, cooperative and no distress Resp: clear to auscultation bilaterally and anterior exam GI: soft very tender and sore, open wound looks good, the ostomy is edematous and pink. some greenish colored fluid in the bag.  Few BS very hypoactive.  Lab Results:   Recent Labs  09/05/14 0400 09/06/14 0445  WBC 22.3* 11.5*  HGB 6.8* 7.6*  HCT 21.3* 23.5*  PLT 71* 81*    BMET  Recent Labs  09/04/14 0500 09/06/14 0445  NA 135 132*  K 3.7 3.5  CL 99* 99*  CO2 29 26  GLUCOSE 136* 116*  BUN 20 12  CREATININE <0.30* 0.37*  CALCIUM 7.8* 7.7*   PT/INR No results for input(s): LABPROT, INR in the last 72 hours.   Recent Labs Lab 08/31/14 0600 09/04/14 0500  AST 22 22  ALT 16 12*  ALKPHOS 78 78  BILITOT 0.5 0.3  PROT 7.0 4.6*  ALBUMIN 3.5 1.7*     Lipase     Component Value Date/Time   LIPASE 30 08/24/2014 0510     Studies/Results: Dg Chest Port 1 View  09/04/2014   CLINICAL DATA:  Sepsis.  Fever, weakness and cough  EXAM: PORTABLE CHEST - 1 VIEW  COMPARISON:  09/02/2014  FINDINGS: There is a left subclavian  catheter with tip in the projection of the SVC. Left IJ catheter is also noted with tip in the SVC. The nasogastric tube is in the stomach. There is no pleural effusion identified. New opacity is identified in the right base. Left lung appears clear.  IMPRESSION: 1. New right lung base opacity.   Electronically Signed   By: Kerby Moors M.D.   On: 09/04/2014 15:16    Medications: . antiseptic oral rinse  7 mL Mouth Rinse q12n4p  . chlorhexidine  15 mL Mouth Rinse BID  . HYDROmorphone PCA 0.3 mg/mL   Intravenous 6 times per day  . imipenem-cilastatin  250 mg Intravenous 4 times per day  . insulin aspart  0-15 Units Subcutaneous 6 times per day  . magnesium sulfate 1 - 4 g bolus IVPB  1 g Intravenous Once  . nicotine  21 mg Transdermal Daily  . pantoprazole (PROTONIX) IV  40 mg Intravenous QHS  . potassium chloride  10 mEq Intravenous Q1 Hr x 2  . scopolamine  1 patch Transdermal Q72H  . sodium chloride  10-40 mL Intracatheter Q12H  . sodium chloride  3 mL Intravenous Q12H   . Marland KitchenTPN (CLINIMIX-E) Adult 60 mL/hr at 09/05/14 1738  . Marland KitchenTPN (CLINIMIX-E) Adult    . sodium chloride 75 mL/hr at 09/06/14 0801    Assessment/Plan 1.  Rectal cancer,  Liver metastasis, rectal stent 06/2014 2.  Small Bowel Obstruction, Rectosigmoid cancer with perforation, Pelvic carcinomatosis, Omental central caking/carcinoimatosis, Liver metatasis S/P EXPLORATORY LAPAROTOMY, SMALL BOWEL RESECTION LOW ANTERIOR RECTOSIGMOID RESECTION (including stent) with COLOSTOMY APPENDECTOMY, HYSTERECTOMY SUPRACERVICAL ABDOMINAL Salpingo-oophorectomy TRANSVERSE COLON RESECTION WITH PARTIAL OMENTECTOMY DEBULKING OF PERITONEUM 09/06/14, Dr. Johney Maine. 3. Sepsis 4.   Anemia/thrombocytopenia 5.    Severe protein calorie malnutrition 6.  Hx of tobacco use 7.  Stage II pressure sores 8.  Antibiotics:  Day 13 Primaxin 9.  DVT:  SCD/ No heparin secondary to anemia thrombocytopenia  Plan:  Did well with NG out, some greenish colored fluid in  the bag, not much gas. Will give her sips today with ice chips.  She has been up and ambulated some already today.  ON PCA and TNA.  Stable with slow improvement from our standpoint.      LOS: 12 days    Sydney Morrison 09/06/2014

## 2014-09-06 NOTE — Progress Notes (Signed)
Dr Wyline Copas states central line can be left in for 7- 10 days.The  patient does not want PICCline .

## 2014-09-07 DIAGNOSIS — L8991 Pressure ulcer of unspecified site, stage 1: Secondary | ICD-10-CM

## 2014-09-07 LAB — GLUCOSE, CAPILLARY
GLUCOSE-CAPILLARY: 113 mg/dL — AB (ref 65–99)
GLUCOSE-CAPILLARY: 119 mg/dL — AB (ref 65–99)
GLUCOSE-CAPILLARY: 138 mg/dL — AB (ref 65–99)
GLUCOSE-CAPILLARY: 151 mg/dL — AB (ref 65–99)
Glucose-Capillary: 136 mg/dL — ABNORMAL HIGH (ref 65–99)
Glucose-Capillary: 126 mg/dL — ABNORMAL HIGH (ref 65–99)

## 2014-09-07 LAB — COMPREHENSIVE METABOLIC PANEL
ALT: 13 U/L — ABNORMAL LOW (ref 14–54)
AST: 22 U/L (ref 15–41)
Albumin: 1.7 g/dL — ABNORMAL LOW (ref 3.5–5.0)
Alkaline Phosphatase: 116 U/L (ref 38–126)
Anion gap: 6 (ref 5–15)
BUN: 12 mg/dL (ref 6–20)
CO2: 26 mmol/L (ref 22–32)
Calcium: 7.7 mg/dL — ABNORMAL LOW (ref 8.9–10.3)
Chloride: 101 mmol/L (ref 101–111)
Creatinine, Ser: 0.39 mg/dL — ABNORMAL LOW (ref 0.44–1.00)
GFR calc Af Amer: >60 mL/min (ref >60)
GFR calc non Af Amer: >60 mL/min (ref >60)
Glucose, Bld: 144 mg/dL — ABNORMAL HIGH (ref 65–99)
Potassium: 3.7 mmol/L (ref 3.5–5.1)
Sodium: 133 mmol/L — ABNORMAL LOW (ref 135–145)
Total Bilirubin: 0.3 mg/dL (ref 0.3–1.2)
Total Protein: 4.6 g/dL — ABNORMAL LOW (ref 6.5–8.1)

## 2014-09-07 LAB — PHOSPHORUS: Phosphorus: 3.6 mg/dL (ref 2.5–4.6)

## 2014-09-07 LAB — MAGNESIUM: MAGNESIUM: 1.7 mg/dL (ref 1.7–2.4)

## 2014-09-07 MED ORDER — MAGNESIUM SULFATE IN D5W 10-5 MG/ML-% IV SOLN
1.0000 g | Freq: Once | INTRAVENOUS | Status: AC
  Administered 2014-09-07: 1 g via INTRAVENOUS
  Filled 2014-09-07: qty 100

## 2014-09-07 MED ORDER — POTASSIUM CHLORIDE 10 MEQ/50ML IV SOLN
10.0000 meq | INTRAVENOUS | Status: AC
  Administered 2014-09-07 (×2): 10 meq via INTRAVENOUS
  Filled 2014-09-07 (×2): qty 50

## 2014-09-07 MED ORDER — TRACE MINERALS CR-CU-MN-SE-ZN 10-1000-500-60 MCG/ML IV SOLN
INTRAVENOUS | Status: AC
  Administered 2014-09-07: 18:00:00 via INTRAVENOUS
  Filled 2014-09-07: qty 1440

## 2014-09-07 MED ORDER — FAT EMULSION 20 % IV EMUL
240.0000 mL | INTRAVENOUS | Status: AC
  Administered 2014-09-07: 240 mL via INTRAVENOUS
  Filled 2014-09-07: qty 250

## 2014-09-07 NOTE — Progress Notes (Signed)
Patient ID: Sydney Morrison, female   DOB: 04/25/1955, 59 y.o.   MRN: [id number]  [facility]  POD#: 7  Subjective: Patient in bed, family at bedside.  Pleasant.  Foley out this AM and voided.  Nausea from last night resolved.  Objective: Vital signs in last 24 hours: Temp:  [98.1 F (36.7 C)-99.2 F (37.3 C)] 98.1 F (36.7 C) (06/09 0818) Resp:  [14-22] 19 (06/09 0818) BP: (119-146)/(53-75) 127/54 mmHg (06/09 0600) SpO2:  [96 %-99 %] 99 % (06/09 0818) Weight:  [53 kg (116 lb 13.5 oz)] 53 kg (116 lb 13.5 oz) (06/09 0500) Last BM Date: 09/04/14  Intake/Output from previous day: 06/08 0701 - 06/09 0700 In: 3720 [I.V.:1800; IV Piggyback:300; TPN:1380] Out: 4315 [Urine:4295; Drains:20] Intake/Output this shift: Total I/O In: -  Out: 325 [Urine:325]  Physical Exam: HEENT - sclerae clear, mucous membranes moist Neck - soft, CVP left neck Chest - clear bilaterally Cor - RRR Abdomen - soft, rare BS present; dressing dry and intact; stoma viable, minimal in bag Ext - no edema, non-tender Neuro - alert & oriented, no focal deficits  Lab Results:   Recent Labs  09/05/14 0400 09/06/14 0445  WBC 22.3* 11.5*  HGB 6.8* 7.6*  HCT 21.3* 23.5*  PLT 71* 81*   BMET  Recent Labs  09/06/14 0445 09/07/14 0500  NA 132* 133*  K 3.5 3.7  CL 99* 101  CO2 26 26  GLUCOSE 116* 144*  BUN 12 12  CREATININE 0.37* 0.39*  CALCIUM 7.7* 7.7*   PT/INR No results for input(s): LABPROT, INR in the last 72 hours. Comprehensive Metabolic Panel:    Component Value Date/Time   NA 133* 09/07/2014 0500   NA 132* 09/06/2014 0445   NA 140 08/16/2014 1037   NA 138 08/02/2014 1112   K 3.7 09/07/2014 0500   K 3.5 09/06/2014 0445   K 4.2 08/16/2014 1037   K 4.4 08/02/2014 1112   CL 101 09/07/2014 0500   CL 99* 09/06/2014 0445   CO2 26 09/07/2014 0500   CO2 26 09/06/2014 0445   CO2 28 08/16/2014 1037   CO2 25 08/02/2014 1112   BUN 12 09/07/2014 0500   BUN 12 09/06/2014 0445   BUN 23.1 08/16/2014 1037   BUN 22.0 08/02/2014 1112   CREATININE 0.39* 09/07/2014 0500   CREATININE 0.37* 09/06/2014 0445   CREATININE 0.7 08/16/2014 1037   CREATININE 0.7 08/02/2014 1112   GLUCOSE 144* 09/07/2014 0500   GLUCOSE 116* 09/06/2014 0445   GLUCOSE 92 08/16/2014 1037   GLUCOSE 96 08/02/2014 1112   CALCIUM 7.7* 09/07/2014 0500   CALCIUM 7.7* 09/06/2014 0445   CALCIUM 9.5 08/16/2014 1037   CALCIUM 9.6 08/02/2014 1112   AST 22 09/07/2014 0500   AST 22 09/04/2014 0500   AST 21 08/16/2014 1037   AST 21 08/02/2014 1112   ALT 13* 09/07/2014 0500   ALT 12* 09/04/2014 0500   ALT 15 08/16/2014 1037   ALT 14 08/02/2014 1112   ALKPHOS 116 09/07/2014 0500   ALKPHOS 78 09/04/2014 0500   ALKPHOS 143 08/16/2014 1037   ALKPHOS 175* 08/02/2014 1112   BILITOT 0.3 09/07/2014 0500   BILITOT 0.3 09/04/2014 0500   BILITOT 0.28 08/16/2014 1037   BILITOT 0.28 08/02/2014 1112   PROT 4.6* 09/07/2014 0500   PROT 4.6* 09/04/2014 0500   PROT 7.5 08/16/2014 1037   PROT 7.6 08/02/2014 1112   ALBUMIN 1.7* 09/07/2014 0500   ALBUMIN 1.7*  09/04/2014 0500   ALBUMIN 3.8 08/16/2014 1037   ALBUMIN 3.6 08/02/2014 1112    Studies/Results: No results found.  Anti-infectives: Anti-infectives    Start     Dose/Rate Route Frequency Ordered Stop   09/01/14 1200  imipenem-cilastatin (PRIMAXIN) 250 mg in sodium chloride 0.9 % 100 mL IVPB  Status:  Discontinued     250 mg 200 mL/hr over 30 Minutes Intravenous 4 times per day 09/01/14 1051 09/06/14 1759   08/31/14 1030  clindamycin (CLEOCIN) 900 mg, gentamicin (GARAMYCIN) 240 mg in sodium chloride 0.9 % 1,000 mL for intraperitoneal lavage  Status:  Discontinued      Intraperitoneal To Surgery 08/31/14 1023 08/31/14 1513   08/31/14 0600  clindamycin (CLEOCIN) IVPB 900 mg     900 mg 100 mL/hr over 30 Minutes Intravenous 60 min pre-op 08/30/14 1218 08/31/14 0944   08/31/14 0600  [MAR Hold]  gentamicin (GARAMYCIN) 220 mg in  dextrose 5 % 100 mL IVPB     (MAR Hold since 08/31/14 1010)   5 mg/kg  44.1 kg 105.5 mL/hr over 60 Minutes Intravenous 60 min pre-op 08/30/14 1218 08/31/14 1005   08/30/14 2100  imipenem-cilastatin (PRIMAXIN) 250 mg in sodium chloride 0.9 % 100 mL IVPB  Status:  Discontinued     250 mg 200 mL/hr over 30 Minutes Intravenous 3 times per day 08/30/14 1739 09/01/14 1051   08/25/14 1800  imipenem-cilastatin (PRIMAXIN) 250 mg in sodium chloride 0.9 % 100 mL IVPB  Status:  Discontinued     250 mg 200 mL/hr over 30 Minutes Intravenous Every 6 hours 08/25/14 1716 08/30/14 1739   08/25/14 1800  metroNIDAZOLE (FLAGYL) IVPB 500 mg  Status:  Discontinued     500 mg 100 mL/hr over 60 Minutes Intravenous Every 8 hours 08/25/14 1716 08/27/14 1003      Assessment & Plans: Small bowel obstruction, rectosigmoid cancer with perforation, pelvic carcinomatosis, omental central caking/carcinoimatosis, liver metastases s/p expl lap, SB resection, rectosigmoid colon resection, colostomy, appendectomy, TAH, transverse colon resection, partial omentectomy, peritoneal debulking 08/31/14 (Dr. Johney Maine)  Begin clear liquid diet Continue Primaxin per ID consult OOB, ambulate - PT to see daily Wound care with twice daily dressing changes TNA per pharmacy  Earnstine Regal, MD, [facility] Office: [place] 09/07/2014

## 2014-09-07 NOTE — Progress Notes (Signed)
PARENTERAL NUTRITION CONSULT NOTE - Follow-up  Pharmacy Consult for TPN Indication: Persistent SBO  Allergies  Allergen Reactions  . Avelox [Moxifloxacin Hcl In Nacl] Hives  . Codeine Hives  . Dextromethorphan Hives  . Erythromycin Hives  . Penicillins Hives    No problem with Imipenem  . Suprep [Na Sulfate-K Sulfate-Mg Sulf] Nausea And Vomiting    Patient Measurements: Height: 5' 7"  (170.2 cm) Weight: 116 lb 13.5 oz (53 kg) IBW/kg (Calculated) : 61.6 Usual Weight: 50.4 kg  Vital Signs: Temp: 99.1 F (37.3 C) (06/09 0400) Temp Source: Oral (06/09 0400) BP: 127/54 mmHg (06/09 0600) Intake/Output from previous day: 06/08 0701 - 06/09 0700 In: 3720 [I.V.:1800; IV Piggyback:300; TPN:1380] Out: 6440 [Urine:4295; Drains:20]  Labs:  Recent Labs  09/05/14 0400 09/06/14 0445  WBC 22.3* 11.5*  HGB 6.8* 7.6*  HCT 21.3* 23.5*  PLT 71* 81*     Recent Labs  09/06/14 0445 09/07/14 0500  NA 132* 133*  K 3.5 3.7  CL 99* 101  CO2 26 26  GLUCOSE 116* 144*  BUN 12 12  CREATININE 0.37* 0.39*  CALCIUM 7.7* 7.7*  MG 1.5* 1.7  PHOS  --  3.6  PROT  --  4.6*  ALBUMIN  --  1.7*  AST  --  22  ALT  --  13*  ALKPHOS  --  116  BILITOT  --  0.3   Estimated Creatinine Clearance: 64.9 mL/min (by C-G formula based on Cr of 0.39).    Recent Labs  09/06/14 1220 09/06/14 1619 09/06/14 2014  GLUCAP 126* 132* 104*   Medications, infusions:  . Marland KitchenTPN (CLINIMIX-E) Adult 60 mL/hr at 09/06/14 1726  . sodium chloride 1,000 mL (09/06/14 2058)   Insulin Requirements: Moderate SSI q4h: 9 units / 24 hours.  Current Nutrition: NPO --> now getting sips with ice chips  IVF: NS at 75 ml/hr  Central access: CVC port L chest TPN start date: 6/1  ASSESSMENT                                                                                                          HPI: 66yoF with PMH metastatic rectal cancer and rectal stent 06/2014, s/p 3 cycles chemo presents 5/27 with N/V x 1 day.   Found to have SBO, likely secondary to pelvic tumor on CT.  NG tube placed and later removed once SBO improved on Abd XR.  Started on CL diet, but persistent N/V prevent meaningful PO intake.  To start on TPN per pharmacy for nutritional supplementation until tolerating PO.  Significant events:  5/31: patient misunderstood Surgeon to imply that TPN was not going to start today and did not allow TPN to be initiated. 6/2: s/p exp laparotomy, SB resection, LAR, stent, colostomy, appendectomy, hysterectomy, salpingo-oophorectomy, transverse colon resection, debulking of peritoneum. 6/6: clamp NG tube 6/7: NGT d/c'ed  Today:   Glucose (goal <150): No Hx DM.  cbgs <150  Electrolytes: Na slightly low at 133 (unable to adjust in premixed Clinimix bag), Mag on low end of normal range at 1.7,  K+ at low end of normal range at 3.7, Corrected Ca WNL  Renal: SCr low, stable  LFTs: AST WNL, ALT low, Alk Phos and Tbili WNL   TGs: elevated at baseline at 226 (6/1), 74 (6/6)  Prealbumin: 13.2 (6/1), 6 (6/6)  NUTRITIONAL GOALS                                                                                             RD recs: Nutrition needs: 1600-1800 kcal, 70-80 grams protein.  Clinimix 5/20 @ 60 mL/hr + 20% IV fat emulsion at 10 ml/hr to provide 72 grams protein and 1746 kcal   PLAN                                                                                                                          Magnesium Sulfate 1g IV x 1  KCl 10 mEq IV x 2 runs At 1800 today:  Continue Clinimix E 5/20 IV at 60 ml/hr.  Patient no longer in ICU status, so will add 20% IV fat emulsion at 10 ml/hr.  TPN to contain standard multivitamins and trace elements.  IVF per MD orders, NS at 75 ml/hr.    Continue CBGs and moderate SSI q4h   TPN lab panels on Mondays & Thursdays.  BMET, Mag in AM.   Lindell Spar, PharmD, BCPS Pager: [phone] 09/07/2014 7:43 AM

## 2014-09-07 NOTE — Progress Notes (Signed)
[facility]    Subjective:  Feeling much better   Antibiotics:  Anti-infectives    Start     Dose/Rate Route Frequency Ordered Stop   09/01/14 1200  imipenem-cilastatin (PRIMAXIN) 250 mg in sodium chloride 0.9 % 100 mL IVPB  Status:  Discontinued     250 mg 200 mL/hr over 30 Minutes Intravenous 4 times per day 09/01/14 1051 09/06/14 1759   08/31/14 1030  clindamycin (CLEOCIN) 900 mg, gentamicin (GARAMYCIN) 240 mg in sodium chloride 0.9 % 1,000 mL for intraperitoneal lavage  Status:  Discontinued      Intraperitoneal To Surgery 08/31/14 1023 08/31/14 1513   08/31/14 0600  clindamycin (CLEOCIN) IVPB 900 mg     900 mg 100 mL/hr over 30 Minutes Intravenous 60 min pre-op 08/30/14 1218 08/31/14 0944   08/31/14 0600  [MAR Hold]  gentamicin (GARAMYCIN) 220 mg in dextrose 5 % 100 mL IVPB     (MAR Hold since 08/31/14 1010)   5 mg/kg  44.1 kg 105.5 mL/hr over 60 Minutes Intravenous 60 min pre-op 08/30/14 1218 08/31/14 1005   08/30/14 2100  imipenem-cilastatin (PRIMAXIN) 250 mg in sodium chloride 0.9 % 100 mL IVPB  Status:  Discontinued     250 mg 200 mL/hr over 30 Minutes Intravenous 3 times per day 08/30/14 1739 09/01/14 1051   08/25/14 1800  imipenem-cilastatin (PRIMAXIN) 250 mg in sodium chloride 0.9 % 100 mL IVPB  Status:  Discontinued     250 mg 200 mL/hr over 30 Minutes Intravenous Every 6 hours 08/25/14 1716 08/30/14 1739   08/25/14 1800  metroNIDAZOLE (FLAGYL) IVPB 500 mg  Status:  Discontinued     500 mg 100 mL/hr over 60 Minutes Intravenous Every 8 hours 08/25/14 1716 08/27/14 1003      Medications: Scheduled Meds: . antiseptic oral rinse  7 mL Mouth Rinse q12n4p  . chlorhexidine  15 mL Mouth Rinse BID  . HYDROmorphone PCA 0.3 mg/mL   Intravenous 6 times per day  . insulin aspart  0-15 Units Subcutaneous 6 times per day  . nicotine  21 mg Transdermal Daily  . pantoprazole (PROTONIX) IV  40 mg Intravenous QHS  . scopolamine  1 patch  Transdermal Q72H  . sodium chloride  10-40 mL Intracatheter Q12H  . sodium chloride  3 mL Intravenous Q12H   Continuous Infusions: . Marland KitchenTPN (CLINIMIX-E) Adult 60 mL/hr at 09/06/14 1726  . sodium chloride 1,000 mL (09/06/14 2058)  . Marland KitchenTPN (CLINIMIX-E) Adult     And  . fat emulsion     PRN Meds:.acetaminophen **OR** acetaminophen, diphenhydrAMINE **OR** diphenhydrAMINE, HYDROmorphone (DILAUDID) injection, LORazepam, metoCLOPramide, naloxone **AND** sodium chloride, nitroGLYCERIN, ondansetron (ZOFRAN) IV **OR** ondansetron (ZOFRAN) IV, ondansetron (ZOFRAN) IV, ondansetron, phenol, promethazine, sodium chloride    Objective: Weight change: 0 lb (0 kg)  Intake/Output Summary (Last 24 hours) at 09/07/14 1718 Last data filed at 09/07/14 1500  Gross per 24 hour  Intake   3050 ml  Output   4220 ml  Net  -1170 ml   Blood pressure 150/61, pulse 87, temperature 98.2 F (36.8 C), temperature source Oral, resp. rate 22, height 5\' 7"  (1.702 m), weight 116 lb 13.5 oz (53 kg), SpO2 99 %. Temp:  [98.1 F (36.7 C)-99.2 F (37.3 C)] 98.2 F (36.8 C) (06/09 1200) Resp:  [14-22] 22 (06/09 1636) BP: (119-150)/(53-64) 150/61 mmHg (06/09 1600) SpO2:  [96 %-100 %] 99 % (06/09 1600) Weight:  [116 lb 13.5 oz (53  kg)] 116 lb 13.5 oz (53 kg) (06/09 0500)  Physical Exam: General: Alert and awake, oriented x3, fatigued, dysphoric but pleasant HEENT: anicteric sclera, , EOMI, oropharynx clear and without exudate CVS regular rate, normal r, no murmur rubs or gallops Chest: clear to auscultation bilaterally, no wheezing, rales or rhonchi Abdomen: diffusely tender, ostomy pink with no stool in bag. +bs Extremities: no clubbing or edema noted bilaterally Skin: portacath clean, central line 3 lumen clean, incision site is packed but no erythema in surrounding skin  Decubitus ulcer not examined today see prior picture  Neuro: nonfocal, strength and sensation intact  CBC: CBC Latest Ref Rng 09/06/2014  09/05/2014 09/04/2014  WBC 4.0 - 10.5 K/uL 11.5(H) 22.3(H) 25.2(H)  Hemoglobin 12.0 - 15.0 g/dL 7.6(L) 6.8(LL) 7.2(L)  Hematocrit 36.0 - 46.0 % 23.5(L) 21.3(L) 22.4(L)  Platelets 150 - 400 K/uL 81(L) 71(L) 72(L)       BMET  Recent Labs  09/06/14 0445 09/07/14 0500  NA 132* 133*  K 3.5 3.7  CL 99* 101  CO2 26 26  GLUCOSE 116* 144*  BUN 12 12  CREATININE 0.37* 0.39*  CALCIUM 7.7* 7.7*     Liver Panel   Recent Labs  09/07/14 0500  PROT 4.6*  ALBUMIN 1.7*  AST 22  ALT 13*  ALKPHOS 116  BILITOT 0.3       Sedimentation Rate No results for input(s): ESRSEDRATE in the last 72 hours. C-Reactive Protein No results for input(s): CRP in the last 72 hours.  Micro Results: Recent Results (from the past 720 hour(s))  Culture, blood (routine x 2)     Status: None   Collection Time: 08/26/14  7:05 AM  Result Value Ref Range Status   Specimen Description BLOOD RIGHT ARM  Final   Special Requests   Final    BOTTLES DRAWN AEROBIC AND ANAEROBIC 10CC BOTH BOTTLES   Culture   Final    NO GROWTH 5 DAYS Performed at [facility]    Report Status 09/01/2014 FINAL  Final  Culture, blood (routine x 2)     Status: None   Collection Time: 08/26/14  7:10 AM  Result Value Ref Range Status   Specimen Description BLOOD LEFT ARM  Final   Special Requests   Final    BOTTLES DRAWN AEROBIC AND ANAEROBIC 10CC BOTH BOTTLES   Culture   Final    NO GROWTH 5 DAYS Performed at [facility]    Report Status 09/01/2014 FINAL  Final  Surgical pcr screen     Status: None   Collection Time: 08/30/14 10:08 PM  Result Value Ref Range Status   MRSA, PCR NEGATIVE NEGATIVE Final   Staphylococcus aureus NEGATIVE NEGATIVE Final    Comment:        The Xpert SA Assay (FDA approved for NASAL specimens in patients over 14 years of age), is one component of a comprehensive surveillance program.  Test performance has been validated by [facility] for patients greater than or  equal to 32 year old. It is not intended to diagnose infection nor to guide or monitor treatment.   Culture, blood (routine x 2)     Status: None (Preliminary result)   Collection Time: 09/04/14  7:58 AM  Result Value Ref Range Status   Specimen Description BLOOD LEFT ARM  Final   Special Requests BOTTLES DRAWN AEROBIC ONLY 3CC  Final   Culture   Final           BLOOD CULTURE  RECEIVED NO GROWTH TO DATE CULTURE WILL BE HELD FOR 5 DAYS BEFORE ISSUING A FINAL NEGATIVE REPORT Performed at [facility]    Report Status PENDING  Incomplete  Culture, blood (routine x 2)     Status: None (Preliminary result)   Collection Time: 09/04/14  8:00 AM  Result Value Ref Range Status   Specimen Description RIGHT ANTECUBITAL  Final   Special Requests BOTTLES DRAWN AEROBIC AND ANAEROBIC 5CC  Final   Culture   Final           BLOOD CULTURE RECEIVED NO GROWTH TO DATE CULTURE WILL BE HELD FOR 5 DAYS BEFORE ISSUING A FINAL NEGATIVE REPORT Performed at [facility]    Report Status PENDING  Incomplete    Studies/Results: No results found.    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 female with rectal cancer with metastases sp rectal stent in April, sp 3 cycles of chemotherapy who developed SBO With perforration and required ex-laparotomy on 08/31/14 with rectosigmoid resection with diverting colostomy, appendectomy, TAH, transverse colon resection, partial omentectomy, peritoneal debulking 08/31/14. Her postop course was been complicated  by persistent ileus, leukocytosis and fever which have subsequently resolved  #1 Postoperative fevers: resolved. Imipenem has been discontinued for past day.  IF SHE FEVERS AGAIN SHE NEEDS A CT ABDOMEN AND PELVIS WITH IV AND ORAL CONTRAST  I think she is progressing nicely  #2 Decubitus ulcer: greatly appreciate WOC help  I will sign off for now. Please call back with further questions.     LOS: 13 days   Alcide Evener 09/07/2014, 5:18 PM

## 2014-09-07 NOTE — Progress Notes (Signed)
Date:  September 07, 2014 U.R. performed for needs and level of care. Will continue to follow for Case Management needs.  Velva Harman, RN, BSN, [place]   [phone]

## 2014-09-07 NOTE — Progress Notes (Signed)
[facility] PROGRESS NOTE  Sydney Morrison HQI:[id number] DOB: 11/16/1955 DOA: 08/25/2014 PCP: Delphina Cahill, MD  Assessment/Plan: Small bowel obstruction -Initially failed conservative management.  -Surgery followed and pt underwent exploratory laparotomy on 6/2 with lower anterior rectosigmoid resection with colostomy.  -Pt is s/p appendectomy, hysterectomy, and salpingoophorectomy, and debulking of peritoneum. -Earlier noted to be hypotensive, improved with aggressive IVF -Remains on Dilaudid PCA for pain. On TNA.  - Continue abdominal drain. PT eval.  -recommend foley for 7 days post op given bladder manipulation during surgery. ( until at least 6/9) -Continue ambulation as tolerated per Surgery - Pt reports flatus  Sepsis -Patient febrile recently with progressive leukocytosis.  -Repeat chest x-ray showing new right lung base opacity -UA unremarkable -Had continued on Primaxin. Was on IV Flagyl From 5/27-5/31. -ID since consulted. Discussed with ID with recommendations to d/c abx as of 6/8 -If sepsis worsens or if pt becomes febrile, ID recommends CT abd and to resume abx at that time - ID has since signed off  Rectal ca with liver mets -sees Dr Benay Spice. S/p 3 cycles of chemo.  -Dr. Benay Spice to follow-up as outpatient.  Chest pain and Aflutter with RVR -cardiology was consulted.  - 2D echo with normal EF - Cardiology has since signed off.  Hypokalemia/ hypophsphatemia/ hypomagnesemia - Replace as needed - Cont to monitor  Thrombocytopenia -avoid heparin products. Monitor closely.  Stage II pressure ulcer - No signs of infection - Pink foam applied.  Tobacco abuse - Continue nicotine patch.  Code Status: Full Family Communication: Pt in room Disposition Plan: Transfer to med-tele  Consultants:  General Surgery  Cardiology  Infectious Disease  Procedures: 6/2: EXPLORATORY LAPAROTOMY SMALL BOWEL RESECTION LOW ANTERIOR RECTOSIGMOID RESECTION  (including stent) with COLOSTOMY APPENDECTOMY HYSTERECTOMY SUPRACERVICAL ABDOMINAL Salpingo-oophorectomy TRANSVERSE COLON RESECTION WITH PARTIAL OMENTECTOMY DEBULKING OF PERITONEUM  Antibiotics:  Primaxin 5/27>>>6/8  HPI/Subjective: Reports feeling better today. No complaints. Tolerating ice chips  Objective: Filed Vitals:   09/07/14 1400 09/07/14 1600 09/07/14 1636 09/07/14 1732  BP: 127/58 150/61  125/57  Pulse:    70  Temp:  98.4 F (36.9 C)  98.6 F (37 C)  TempSrc:  Oral  Oral  Resp:   22 20  Height:      Weight:      SpO2: 98% 99%  95%    Intake/Output Summary (Last 24 hours) at 09/07/14 1843 Last data filed at 09/07/14 1700  Gross per 24 hour  Intake   3235 ml  Output   3905 ml  Net   -670 ml   Filed Weights   09/05/14 0500 09/06/14 0500 09/07/14 0500  Weight: 56.3 kg (124 lb 1.9 oz) 53 kg (116 lb 13.5 oz) 53 kg (116 lb 13.5 oz)    Exam:   General:  Awake, laying in bed, in nad  Cardiovascular: regular, s1, s2  Respiratory: normal resp effort, no wheezing  Abdomen: soft,nondistended, decreased BS  Musculoskeletal: perfused, no clubbing   Data Reviewed: Basic Metabolic Panel:  Recent Labs Lab 09/01/14 0545 09/02/14 0540 09/03/14 0500 09/04/14 0500 09/06/14 0445 09/07/14 0500  NA 141 139 136 135 132* 133*  K 3.7 3.0* 3.3* 3.7 3.5 3.7  CL 108 103 100* 99* 99* 101  CO2 28 32 30 29 26 26   GLUCOSE 180* 128* 141* 136* 116* 144*  BUN 26* 20 20 20 12 12   CREATININE 0.51 0.35* 0.31* <0.30* 0.37* 0.39*  CALCIUM 8.0* 7.8* 7.5* 7.8* 7.7* 7.7*  MG 1.4* 1.7 1.6* 1.5*  1.5* 1.7  PHOS 2.7 2.4* 2.2* 2.6  --  3.6   Liver Function Tests:  Recent Labs Lab 09/04/14 0500 09/07/14 0500  AST 22 22  ALT 12* 13*  ALKPHOS 78 116  BILITOT 0.3 0.3  PROT 4.6* 4.6*  ALBUMIN 1.7* 1.7*   No results for input(s): LIPASE, AMYLASE in the last 168 hours. No results for input(s): AMMONIA in the last 168 hours. CBC:  Recent Labs Lab 09/02/14 0800  09/03/14 1000 09/04/14 0500 09/05/14 0400 09/06/14 0445  WBC 23.8* 23.0* 25.2* 22.3* 11.5*  NEUTROABS  --   --  21.9*  --   --   HGB 8.1* 7.2* 7.2* 6.8* 7.6*  HCT 25.0* 22.4* 22.4* 21.3* 23.5*  MCV 86.5 87.8 85.5 86.2 86.1  PLT 77* 64* 72* 71* 81*   Cardiac Enzymes: No results for input(s): CKTOTAL, CKMB, CKMBINDEX, TROPONINI in the last 168 hours. BNP (last 3 results) No results for input(s): BNP in the last 8760 hours.  ProBNP (last 3 results) No results for input(s): PROBNP in the last 8760 hours.  CBG:  Recent Labs Lab 09/06/14 2334 09/07/14 0500 09/07/14 0802 09/07/14 1150 09/07/14 1630  GLUCAP 151* 136* 113* 119* 138*    Recent Results (from the past 240 hour(s))  Surgical pcr screen     Status: None   Collection Time: 08/30/14 10:08 PM  Result Value Ref Range Status   MRSA, PCR NEGATIVE NEGATIVE Final   Staphylococcus aureus NEGATIVE NEGATIVE Final    Comment:        The Xpert SA Assay (FDA approved for NASAL specimens in patients over 94 years of age), is one component of a comprehensive surveillance program.  Test performance has been validated by [facility] for patients greater than or equal to 34 year old. It is not intended to diagnose infection nor to guide or monitor treatment.   Culture, blood (routine x 2)     Status: None (Preliminary result)   Collection Time: 09/04/14  7:58 AM  Result Value Ref Range Status   Specimen Description BLOOD LEFT ARM  Final   Special Requests BOTTLES DRAWN AEROBIC ONLY 3CC  Final   Culture   Final           BLOOD CULTURE RECEIVED NO GROWTH TO DATE CULTURE WILL BE HELD FOR 5 DAYS BEFORE ISSUING A FINAL NEGATIVE REPORT Performed at [facility]    Report Status PENDING  Incomplete  Culture, blood (routine x 2)     Status: None (Preliminary result)   Collection Time: 09/04/14  8:00 AM  Result Value Ref Range Status   Specimen Description RIGHT ANTECUBITAL  Final   Special Requests BOTTLES DRAWN  AEROBIC AND ANAEROBIC 5CC  Final   Culture   Final           BLOOD CULTURE RECEIVED NO GROWTH TO DATE CULTURE WILL BE HELD FOR 5 DAYS BEFORE ISSUING A FINAL NEGATIVE REPORT Performed at [facility]    Report Status PENDING  Incomplete     Studies: No results found.  Scheduled Meds: . HYDROmorphone PCA 0.3 mg/mL   Intravenous 6 times per day  . insulin aspart  0-15 Units Subcutaneous 6 times per day  . nicotine  21 mg Transdermal Daily  . pantoprazole (PROTONIX) IV  40 mg Intravenous QHS  . scopolamine  1 patch Transdermal Q72H  . sodium chloride  10-40 mL Intracatheter Q12H  . sodium chloride  3 mL Intravenous Q12H   Continuous Infusions: .  sodium chloride 1,000 mL (09/06/14 2058)  . Marland KitchenTPN (CLINIMIX-E) Adult 60 mL/hr at 09/07/14 1809   And  . 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 If 7PM-7AM, please contact night-coverage at [email or web address], password [facility] 09/07/2014, 6:43 PM  LOS: 13 days

## 2014-09-08 ENCOUNTER — Encounter: Payer: Self-pay | Admitting: *Deleted

## 2014-09-08 DIAGNOSIS — Z7189 Other specified counseling: Secondary | ICD-10-CM

## 2014-09-08 DIAGNOSIS — Z113 Encounter for screening for infections with a predominantly sexual mode of transmission: Secondary | ICD-10-CM

## 2014-09-08 LAB — BASIC METABOLIC PANEL
ANION GAP: 8 (ref 5–15)
BUN: 13 mg/dL (ref 6–20)
CHLORIDE: 101 mmol/L (ref 101–111)
CO2: 25 mmol/L (ref 22–32)
CREATININE: 0.42 mg/dL — AB (ref 0.44–1.00)
Calcium: 8 mg/dL — ABNORMAL LOW (ref 8.9–10.3)
GFR calc Af Amer: >60 mL/min (ref >60)
Glucose, Bld: 118 mg/dL — ABNORMAL HIGH (ref 65–99)
POTASSIUM: 4.3 mmol/L (ref 3.5–5.1)
Sodium: 134 mmol/L — ABNORMAL LOW (ref 135–145)

## 2014-09-08 LAB — GLUCOSE, CAPILLARY
GLUCOSE-CAPILLARY: 95 mg/dL (ref 65–99)
GLUCOSE-CAPILLARY: 110 mg/dL — AB (ref 65–99)
Glucose-Capillary: 114 mg/dL — ABNORMAL HIGH (ref 65–99)
Glucose-Capillary: 117 mg/dL — ABNORMAL HIGH (ref 65–99)
Glucose-Capillary: 133 mg/dL — ABNORMAL HIGH (ref 65–99)
Glucose-Capillary: 98 mg/dL (ref 65–99)

## 2014-09-08 LAB — MAGNESIUM: Magnesium: 1.8 mg/dL (ref 1.7–2.4)

## 2014-09-08 MED ORDER — BOOST / RESOURCE BREEZE PO LIQD
1.0000 | Freq: Three times a day (TID) | ORAL | Status: DC
  Administered 2014-09-08 – 2014-09-11 (×4): 1 via ORAL

## 2014-09-08 MED ORDER — FAT EMULSION 20 % IV EMUL
240.0000 mL | INTRAVENOUS | Status: AC
  Administered 2014-09-08: 240 mL via INTRAVENOUS
  Filled 2014-09-08: qty 250

## 2014-09-08 MED ORDER — MAGNESIUM SULFATE IN D5W 10-5 MG/ML-% IV SOLN
1.0000 g | Freq: Once | INTRAVENOUS | Status: AC
Start: 2014-09-08 — End: 2014-09-08
  Administered 2014-09-08: 1 g via INTRAVENOUS
  Filled 2014-09-08: qty 100

## 2014-09-08 MED ORDER — TRACE MINERALS CR-CU-MN-SE-ZN 10-1000-500-60 MCG/ML IV SOLN
INTRAVENOUS | Status: AC
  Administered 2014-09-08: 19:00:00 via INTRAVENOUS
  Filled 2014-09-08: qty 1440

## 2014-09-08 NOTE — Progress Notes (Signed)
PARENTERAL NUTRITION CONSULT NOTE - Follow-up  Pharmacy Consult for TPN Indication: Persistent SBO  Allergies  Allergen Reactions  . Avelox [Moxifloxacin Hcl In Nacl] Hives  . Codeine Hives  . Dextromethorphan Hives  . Erythromycin Hives  . Penicillins Hives    No problem with Imipenem  . Suprep [Na Sulfate-K Sulfate-Mg Sulf] Nausea And Vomiting    Patient Measurements: Height: 5' 7"  (170.2 cm) Weight: 115 lb 1.3 oz (52.2 kg) IBW/kg (Calculated) : 61.6 Usual Weight: 50.4 kg  Vital Signs: Temp: 98.4 F (36.9 C) (06/10 0458) Temp Source: Oral (06/10 0458) Intake/Output from previous day: 06/09 0701 - 06/10 0700 In: 3977.5 [P.O.:120; I.V.:1860; TPN:1997.5] Out: 1493.5 [SFKCL:2751; Drains:17.5]  Labs:  Recent Labs  09/06/14 0445  WBC 11.5*  HGB 7.6*  HCT 23.5*  PLT 81*     Recent Labs  09/06/14 0445 09/07/14 0500 09/08/14 0515  NA 132* 133* 134*  K 3.5 3.7 4.3  CL 99* 101 101  CO2 26 26 25   GLUCOSE 116* 144* 118*  BUN 12 12 13   CREATININE 0.37* 0.39* 0.42*  CALCIUM 7.7* 7.7* 8.0*  MG 1.5* 1.7 1.8  PHOS  --  3.6  --   PROT  --  4.6*  --   ALBUMIN  --  1.7*  --   AST  --  22  --   ALT  --  13*  --   ALKPHOS  --  116  --   BILITOT  --  0.3  --    Estimated Creatinine Clearance: 63.9 mL/min (by C-G formula based on Cr of 0.42).    Recent Labs  09/08/14 0004 09/08/14 0455 09/08/14 0730  GLUCAP 98 133* 95   Medications, infusions:  . sodium chloride 75 mL/hr at 09/08/14 0230  . Marland KitchenTPN (CLINIMIX-E) Adult 60 mL/hr at 09/07/14 1809   And  . fat emulsion 240 mL (09/07/14 1809)   Insulin Requirements: Moderate SSI q4h: 6 units / 24 hours.  Current Nutrition: clear liquid  IVF: NS at 75 ml/hr  Central access: CVC port L chest TPN start date: 6/1  ASSESSMENT                                                                                                          HPI: 16yoF with PMH metastatic rectal cancer and rectal stent 06/2014, s/p 3 cycles  chemo presents 5/27 with N/V x 1 day.  Found to have SBO, likely secondary to pelvic tumor on CT.  NG tube placed and later removed once SBO improved on Abd XR.  Started on CL diet, but persistent N/V prevent meaningful PO intake.  To start on TPN per pharmacy for nutritional supplementation until tolerating PO.  Significant events:  5/31: patient misunderstood Surgeon to imply that TPN was not going to start today and did not allow TPN to be initiated. 6/2: s/p exp laparotomy, SB resection, LAR, stent, colostomy, appendectomy, hysterectomy, salpingo-oophorectomy, transverse colon resection, debulking of peritoneum. 6/6: clamp NG tube 6/7: NGT d/c'ed  Today:   Glucose (goal <150): No Hx DM.  cbgs <150  Electrolytes: Na slightly low at 134, Mag on low end of normal range at 1.8, K+ WNL after repleting, Corrected Ca WNL  Renal: SCr low, stable  LFTs: AST WNL, ALT low, Alk Phos and Tbili WNL   TGs: elevated at baseline at 226 (6/1), 74 (6/6)  Prealbumin: 13.2 (6/1), 6 (6/6)  NUTRITIONAL GOALS                                                                                             RD recs: Nutrition needs: 1600-1800 kcal, 70-80 grams protein.  Clinimix 5/20 @ 60 mL/hr + 20% IV fat emulsion at 10 ml/hr to provide 72 grams protein and 1746 kcal   PLAN                                                                                                                          Magnesium Sulfate 1g IV x 1 At 1800 today:  Continue Clinimix E 5/20 IV at 60 ml/hr.  Continue  20% IV fat emulsion at 10 ml/hr.  TPN to contain standard multivitamins and trace elements.  IVF per MD orders, NS at 75 ml/hr.    Continue CBGs and moderate SSI q4h   TPN lab panels on Mondays & Thursdays.  BMET, Mag in AM.   Dolly Rias RPh 09/08/2014, 9:51 AM Pager [phone]

## 2014-09-08 NOTE — Progress Notes (Signed)
Nutrition Follow-up  DOCUMENTATION CODES:  Severe malnutrition in context of chronic illness, Underweight  INTERVENTION: - Continue TPN per pharmacy and wean as able with PO intakes - Continue CLD and advancement per surgery - Will order Resource Breeze po TID, each supplement provides 250 kcal and 9 grams of protein - RD will continue to monitor for needs  NUTRITION DIAGNOSIS:  Malnutrition related to chronic illness as evidenced by percent weight loss, severe depletion of body fat, severe depletion of muscle mass. -ongoing  GOAL:  Patient will meet greater than or equal to 90% of their needs -met with TPN  MONITOR:  Other (Comment), PO intake, Weight trends, Labs, I & O's (TPN)  ASSESSMENT: 59 year old female with past medical history significant for rectal cancer with liver metastasis, rectal stent placement in 06/2014 (under Dr. Gearldine Shown care), status post 3 cycles of chemotherapy, last cycle completed 08/16/2014. She started to develop nausea and vomiting over past 24 hours prior to this admission.   6/10:  - NGT out since last assessment - Diet advanced to CLD 09/07/14 and pt reports she has been taking small sips of gingerale and does not like most of the items on the tray - Pt indicates nausea with intakes, no emesis - Continues with Clinimix E 5/20 @ 60 mL/hr with 20% lipids @ 10 mL/hr which provides 1746 kcal, 72 grams protein - Will order Resource Breeze TID to supplement - Pt will need diet education prior to d/c, will provide as appropriate with diet advancement - Meeting needs with TPN - Medications and labs reviewed;  CBGs: 95-138 mg/dL, Ca: 8 mg/dL     5/29: - Pt followed by [facility], last seen 5/18. - Pt NPO for bowel rest d/t SBO. Pt states that she has not had anything by mouth x 5 days.  - Pt with NGT for suction, output 1000 ml. Recommend nutrition support if bowel rest to last > 7 days.  - Pt at risk for refeeding risk d/t severe  malnutrition and poor PO intake PTA. - Pt with 22 lb weight loss since 3/02 (18% weight loss x 3 months, significant for time frame).  6/2 pt s/p PROCEDURE: Procedure(s): EXPLORATORY LAPAROTOMY SMALL BOWEL RESECTION LOW ANTERIOR RECTOSIGMOID RESECTION (including stent) with COLOSTOMY APPENDECTOMY HYSTERECTOMY SUPRACERVICAL ABDOMINAL Salpingo-oophorectomy TRANSVERSE COLON RESECTION WITH PARTIAL OMENTECTOMY DEBULKING OF PERITONEUM  6/3: - Moved to SDU post-op. - Pt tolerating TPN. Currently running at goal rate of 60 mL/hr providing 1267 kcal (79% of needs) and 72 g protein (100% of needs.)    Height:  Ht Readings from Last 1 Encounters:  08/25/14 5' 7"  (1.702 m)    Weight:  Wt Readings from Last 1 Encounters:  09/08/14 115 lb 1.3 oz (52.2 kg)    Ideal Body Weight:  61.3 kg  Wt Readings from Last 10 Encounters:  09/08/14 115 lb 1.3 oz (52.2 kg)  08/25/14 110 lb (49.896 kg)  08/24/14 109 lb 12.8 oz (49.805 kg)  08/24/14 110 lb (49.896 kg)  08/16/14 110 lb 11.2 oz (50.213 kg)  08/02/14 113 lb (51.256 kg)  07/23/14 114 lb 5 oz (51.852 kg)  07/14/14 118 lb 14.4 oz (53.933 kg)  06/29/14 120 lb (54.432 kg)  05/31/14 125 lb (56.7 kg)    BMI:  Body mass index is 18.02 kg/(m^2).  Estimated Nutritional Needs:  Kcal:  1600-1800  Protein:  70-80g  Fluid:  1.6L/day  Skin:  Wound (see comment) (Stage II sacral ulcer)  Diet Order:  TPN (  CLINIMIX-E) Adult Diet clear liquid Room service appropriate?: Yes; Fluid consistency:: Thin TPN (CLINIMIX-E) Adult  EDUCATION NEEDS:  No education needs identified at this time   Intake/Output Summary (Last 24 hours) at 09/08/14 1156 Last data filed at 09/08/14 0800  Gross per 24 hour  Intake 3557.5 ml  Output 1168.5 ml  Net   2389 ml    Last BM:  6/4    Jarome Matin, RD, LDN Inpatient Clinical Dietitian Pager # [phone] After hours/weekend pager # [phone]

## 2014-09-08 NOTE — Progress Notes (Signed)
8 Days Post-Op  Subjective: Everything is sore and hurts, but she doesn't really complain.  No bowel sounds and nothing but sweat in the bag.    Objective: Vital signs in last 24 hours: Temp:  [98.2 F (36.8 C)-98.6 F (37 C)] 98.4 F (36.9 C) (06/10 0458) Pulse Rate:  [70-72] 72 (06/09 2040) Resp:  [16-22] 16 (06/10 0800) BP: (125-150)/(53-64) 126/53 mmHg (06/09 2040) SpO2:  [95 %-100 %] 97 % (06/10 0800) Weight:  [52.2 kg (115 lb 1.3 oz)] 52.2 kg (115 lb 1.3 oz) (06/10 0518) Last BM Date: 09/04/14 120 PO No stool recorded Afebrile, VSS Labs OK  Intake/Output from previous day: 06/09 0701 - 06/10 0700 In: 3977.5 [P.O.:120; I.V.:1860; TPN:1997.5] Out: 1493.5 [UUVOZ:3664; Drains:17.5] Intake/Output this shift: Total I/O In: 120 [P.O.:120] Out: -   General appearance: alert, cooperative and no distress Resp: clear to auscultation bilaterally and anterior exam. GI: open wound looks fine, ostomy is still edematous, sweat in bag, no gas to speak of.  No BS  Lab Results:   Recent Labs  09/06/14 0445  WBC 11.5*  HGB 7.6*  HCT 23.5*  PLT 81*    BMET  Recent Labs  09/07/14 0500 09/08/14 0515  NA 133* 134*  K 3.7 4.3  CL 101 101  CO2 26 25  GLUCOSE 144* 118*  BUN 12 13  CREATININE 0.39* 0.42*  CALCIUM 7.7* 8.0*   PT/INR No results for input(s): LABPROT, INR in the last 72 hours.   Recent Labs Lab 09/04/14 0500 09/07/14 0500  AST 22 22  ALT 12* 13*  ALKPHOS 78 116  BILITOT 0.3 0.3  PROT 4.6* 4.6*  ALBUMIN 1.7* 1.7*     Lipase     Component Value Date/Time   LIPASE 30 08/24/2014 0510     Studies/Results: No results found.  Medications: . HYDROmorphone PCA 0.3 mg/mL   Intravenous 6 times per day  . insulin aspart  0-15 Units Subcutaneous 6 times per day  . magnesium sulfate 1 - 4 g bolus IVPB  1 g Intravenous Once  . nicotine  21 mg Transdermal Daily  . pantoprazole (PROTONIX) IV  40 mg Intravenous QHS  . scopolamine  1 patch Transdermal  Q72H  . sodium chloride  10-40 mL Intracatheter Q12H  . sodium chloride  3 mL Intravenous Q12H   . sodium chloride 75 mL/hr at 09/08/14 0230  . Marland KitchenTPN (CLINIMIX-E) Adult 60 mL/hr at 09/07/14 1809   And  . fat emulsion 240 mL (09/07/14 1809)  . Marland KitchenTPN (CLINIMIX-E) Adult     And  . fat emulsion      Assessment/Plan 1. Rectal cancer, Liver metastasis, rectal stent 06/2014 2. Small Bowel Obstruction, Rectosigmoid cancer with perforation, Pelvic carcinomatosis, Omental central caking/carcinoimatosis, Liver metatasis S/P EXPLORATORY LAPAROTOMY, SMALL BOWEL RESECTION LOW ANTERIOR RECTOSIGMOID RESECTION (including stent) with COLOSTOMY APPENDECTOMY, HYSTERECTOMY SUPRACERVICAL ABDOMINAL Salpingo-oophorectomy TRANSVERSE COLON RESECTION WITH PARTIAL OMENTECTOMY DEBULKING OF PERITONEUM 09/06/14, Dr. Johney Maine. 2B  Post op ileus 3. Sepsis 4. Anemia/thrombocytopenia 5. Severe protein calorie malnutrition  - TNA 6. Hx of tobacco use 7. Stage II pressure sores 8. Antibiotics: Day 13 Primaxin 9. DVT: SCD/ No heparin secondary to anemia thrombocytopenia    Plan:  Continue to mobilize, she already has PT on board, Continue TNA, wait on ileus to resolve.  She will have labs on Monday when they check her TNA labs.  LOS: 14 days    Sydney Morrison 09/08/2014

## 2014-09-08 NOTE — Progress Notes (Signed)
Physical Therapy Treatment Patient Details Name: Sydney Morrison MRN: [id number] DOB: 02/08/1956 Today's Date: 09/08/2014    History of Present Illness 59 y/o female with hx of rectal ca with liver metastasis, rectal stent placement in 06/2014 (under Dr. Gearldine Shown care), status post 3 cycles of chemotherapy, last cycle completed 08/16/2014 admitted on 5/27 with small bowel obstruction and failed conservative management. taken to OR on 6/2 for exploratory laparotomy with lower anterior rectosigmoid resection with colostomy.    PT Comments    Pt tolerating mobility better this session and able to ambulate good distance in hallway.  Pt will likely progress to d/c home.  Continue to recommend pt ambulate with nursing staff.  Follow Up Recommendations  Home health PT;Supervision - Intermittent     Equipment Recommendations  Rolling walker with 5" wheels    Recommendations for Other Services       Precautions / Restrictions Precautions Precautions: Fall Precaution Comments: multiple lines Restrictions Weight Bearing Restrictions: No    Mobility  Bed Mobility Overal bed mobility: Needs Assistance Bed Mobility: Rolling;Sidelying to Sit;Sit to Supine Rolling: Supervision Sidelying to sit: HOB elevated;Supervision   Sit to supine: Supervision   General bed mobility comments: increased time and effort due to abdominal pain  Transfers Overall transfer level: Needs assistance Equipment used: None Transfers: Sit to/from Stand Sit to Stand: Min guard         General transfer comment: verbal cues for using UE to assist  Ambulation/Gait Ambulation/Gait assistance: Min guard Ambulation Distance (Feet): 160 Feet Assistive device:  (IV Pole) Gait Pattern/deviations: Step-through pattern;Decreased stride length Gait velocity: ambulated on room air with O2 WNL, pt denies SOB today, but c/o fatigue, tolerated improved distance well   General Gait Details: pt wished to try  ambulating without assistive device today however still requiring UE support so utilized IV pole   Stairs            Wheelchair Mobility    Modified Rankin (Stroke Patients Only)       Balance                                    Cognition Arousal/Alertness: Awake/alert Behavior During Therapy: WFL for tasks assessed/performed Overall Cognitive Status: Within Functional Limits for tasks assessed                      Exercises      General Comments        Pertinent Vitals/Pain Pain Assessment: 0-10 Pain Score: 5  Pain Location: abdomen with bed mobility Pain Descriptors / Indicators: Aching;Sore Pain Intervention(s): Limited activity within patient's tolerance;Monitored during session;PCA encouraged    Home Living                      Prior Function            PT Goals (current goals can now be found in the care plan section) Progress towards PT goals: Progressing toward goals    Frequency  Min 3X/week    PT Plan Current plan remains appropriate    Co-evaluation             End of Session   Activity Tolerance: Patient tolerated treatment well Patient left: in bed;with call bell/phone within reach     Time: 0915-0931 PT Time Calculation (min) (ACUTE ONLY): 16 min  Charges:  $Gait Training: 8-22 mins  G Codes:      Kemaya Dorner,KATHrine E September 14, 2014, 12:32 PM Carmelia Bake, PT, DPT 09/14/14 Pager: [phone]

## 2014-09-08 NOTE — Progress Notes (Signed)
[facility] PROGRESS NOTE  Sydney Morrison UEA:[id number] DOB: 12/20/1955 DOA: 08/25/2014 PCP: Delphina Cahill, MD  Assessment/Plan: Small bowel obstruction - Initially failed conservative management.  - Surgery followed and pt underwent exploratory laparotomy on 6/2 with lower anterior rectosigmoid resection with colostomy.  - Pt is s/p appendectomy, hysterectomy, and salpingoophorectomy, and debulking of peritoneum. - Remains on Dilaudid PCA for pain. On TNA.  - Continue abdominal drain. - recommend foley for 7 days post op given bladder manipulation during surgery. ( until at least 6/9) - Continue ambulation as tolerated per Surgery - Pt tolerating small amounts of clears. Nutrition following.  Sepsis secondary to possible PNA - Patient febrile recently with progressive leukocytosis.  - Repeat chest x-ray showing new right lung base opacity - UA unremarkable - Had continued on Primaxin. Was on IV Flagyl From 5/27-5/31. - ID since consulted. Discussed with ID with recommendations to d/c abx as of 6/8 - If sepsis worsens or if pt becomes febrile, ID recommends CT abd and to resume abx at that time - ID has since signed off - Remains stable thus far  Rectal ca with liver mets -sees Dr Benay Spice. S/p 3 cycles of chemo.  -Dr. Benay Spice to follow-up as outpatient.  Chest pain and Aflutter with RVR -cardiology was consulted.  - 2D echo with normal EF - Cardiology has since signed off.  Hypokalemia/ hypophsphatemia/ hypomagnesemia - Replace as needed - Cont to monitor  Thrombocytopenia -avoid heparin products. Monitor closely.  Stage II pressure ulcer - No signs of infection - Pink foam applied.  Tobacco abuse - Continue nicotine patch.  Code Status: Full Family Communication: Pt in room Disposition Plan: Pending  Consultants:  General Surgery  Cardiology  Infectious Disease  Procedures: 6/2: EXPLORATORY LAPAROTOMY SMALL BOWEL RESECTION LOW ANTERIOR  RECTOSIGMOID RESECTION (including stent) with COLOSTOMY APPENDECTOMY HYSTERECTOMY SUPRACERVICAL ABDOMINAL Salpingo-oophorectomy TRANSVERSE COLON RESECTION WITH PARTIAL OMENTECTOMY DEBULKING OF PERITONEUM  Antibiotics:  Primaxin 5/27>>>6/8  HPI/Subjective: No complaints today. Pt reports tolerating small amounts of clears. No nausea  Objective: Filed Vitals:   09/08/14 0458 09/08/14 0518 09/08/14 0800 09/08/14 1110  BP:      Pulse:      Temp: 98.4 F (36.9 C)     TempSrc: Oral     Resp: 16 20 16 20   Height:      Weight:  52.2 kg (115 lb 1.3 oz)    SpO2:  96% 97% 97%    Intake/Output Summary (Last 24 hours) at 09/08/14 1338 Last data filed at 09/08/14 1300  Gross per 24 hour  Intake 3287.5 ml  Output  818.5 ml  Net   2469 ml   Filed Weights   09/06/14 0500 09/07/14 0500 09/08/14 0518  Weight: 53 kg (116 lb 13.5 oz) 53 kg (116 lb 13.5 oz) 52.2 kg (115 lb 1.3 oz)    Exam:   General:  Awake, laying in bed, in nad  Cardiovascular: regular, s1, s2  Respiratory: normal resp effort, no wheezing, no crackles  Abdomen: soft,nontender, decreased BS  Musculoskeletal: perfused, no clubbing   Data Reviewed: Basic Metabolic Panel:  Recent Labs Lab 09/02/14 0540 09/03/14 0500 09/04/14 0500 09/06/14 0445 09/07/14 0500 09/08/14 0515  NA 139 136 135 132* 133* 134*  K 3.0* 3.3* 3.7 3.5 3.7 4.3  CL 103 100* 99* 99* 101 101  CO2 32 30 29 26 26 25   GLUCOSE 128* 141* 136* 116* 144* 118*  BUN 20 20 20 12 12 13   CREATININE 0.35* 0.31* <0.30* 0.37*  0.39* 0.42*  CALCIUM 7.8* 7.5* 7.8* 7.7* 7.7* 8.0*  MG 1.7 1.6* 1.5* 1.5* 1.7 1.8  PHOS 2.4* 2.2* 2.6  --  3.6  --    Liver Function Tests:  Recent Labs Lab 09/04/14 0500 09/07/14 0500  AST 22 22  ALT 12* 13*  ALKPHOS 78 116  BILITOT 0.3 0.3  PROT 4.6* 4.6*  ALBUMIN 1.7* 1.7*   No results for input(s): LIPASE, AMYLASE in the last 168 hours. No results for input(s): AMMONIA in the last 168 hours. CBC:  Recent  Labs Lab 09/02/14 0800 09/03/14 1000 09/04/14 0500 09/05/14 0400 09/06/14 0445  WBC 23.8* 23.0* 25.2* 22.3* 11.5*  NEUTROABS  --   --  21.9*  --   --   HGB 8.1* 7.2* 7.2* 6.8* 7.6*  HCT 25.0* 22.4* 22.4* 21.3* 23.5*  MCV 86.5 87.8 85.5 86.2 86.1  PLT 77* 64* 72* 71* 81*   Cardiac Enzymes: No results for input(s): CKTOTAL, CKMB, CKMBINDEX, TROPONINI in the last 168 hours. BNP (last 3 results) No results for input(s): BNP in the last 8760 hours.  ProBNP (last 3 results) No results for input(s): PROBNP in the last 8760 hours.  CBG:  Recent Labs Lab 09/07/14 2035 09/08/14 0004 09/08/14 0455 09/08/14 0730 09/08/14 1158  GLUCAP 126* 98 133* 95 117*    Recent Results (from the past 240 hour(s))  Surgical pcr screen     Status: None   Collection Time: 08/30/14 10:08 PM  Result Value Ref Range Status   MRSA, PCR NEGATIVE NEGATIVE Final   Staphylococcus aureus NEGATIVE NEGATIVE Final    Comment:        The Xpert SA Assay (FDA approved for NASAL specimens in patients over 13 years of age), is one component of a comprehensive surveillance program.  Test performance has been validated by [facility] for patients greater than or equal to 30 year old. It is not intended to diagnose infection nor to guide or monitor treatment.   Culture, blood (routine x 2)     Status: None (Preliminary result)   Collection Time: 09/04/14  7:58 AM  Result Value Ref Range Status   Specimen Description BLOOD LEFT ARM  Final   Special Requests BOTTLES DRAWN AEROBIC ONLY 3CC  Final   Culture   Final           BLOOD CULTURE RECEIVED NO GROWTH TO DATE CULTURE WILL BE HELD FOR 5 DAYS BEFORE ISSUING A FINAL NEGATIVE REPORT Performed at [facility]    Report Status PENDING  Incomplete  Culture, blood (routine x 2)     Status: None (Preliminary result)   Collection Time: 09/04/14  8:00 AM  Result Value Ref Range Status   Specimen Description RIGHT ANTECUBITAL  Final   Special  Requests BOTTLES DRAWN AEROBIC AND ANAEROBIC 5CC  Final   Culture   Final           BLOOD CULTURE RECEIVED NO GROWTH TO DATE CULTURE WILL BE HELD FOR 5 DAYS BEFORE ISSUING A FINAL NEGATIVE REPORT Performed at [facility]    Report Status PENDING  Incomplete     Studies: No results found.  Scheduled Meds: . feeding supplement (RESOURCE BREEZE)  1 Container Oral TID BM  . HYDROmorphone PCA 0.3 mg/mL   Intravenous 6 times per day  . insulin aspart  0-15 Units Subcutaneous 6 times per day  . nicotine  21 mg Transdermal Daily  . pantoprazole (PROTONIX) IV  40 mg Intravenous QHS  .  scopolamine  1 patch Transdermal Q72H  . sodium chloride  10-40 mL Intracatheter Q12H  . sodium chloride  3 mL Intravenous Q12H   Continuous Infusions: . sodium chloride 75 mL/hr at 09/08/14 0230  . Marland KitchenTPN (CLINIMIX-E) Adult 60 mL/hr at 09/07/14 1809   And  . fat emulsion 240 mL (09/07/14 1809)  . Marland KitchenTPN (CLINIMIX-E) Adult     And  . 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 If 7PM-7AM, please contact night-coverage at [email or web address], password [facility] 09/08/2014, 1:38 PM  LOS: 14 days

## 2014-09-08 NOTE — [facility] Oncology Navigator Note (Signed)
Oncology Nurse Navigator Documentation  Oncology Nurse Navigator Flowsheets 09/01/2014 09/05/2014 09/08/2014  Navigator Encounter Type Other Other Surgery F/U  Patient Visit Type Inpatient Inpatient Inpatient  Treatment Phase Other Other S/P surgery   Barriers/Navigation Needs No barriers at this time Family concerns No barriers at this time-reports she was able to participate in her ostomy care.   Support Groups/Services - Other Encouraged her in her improvement and care of ostomy. She is looking forward to visitors this weekend.   Time Spent with Patient 15 15 10

## 2014-09-08 NOTE — Care Management Note (Signed)
Case Management Note  Patient Details  Name: Sydney Morrison MRN: [id number] Date of Birth: 1956/01/19  Subjective/Objective:  PT-HHPT, RW. [facility] chosen, TC [facility] rep Lelan Pons informed of referral.Await HHC  orders.                  Action/Plan:d/c plan home w/HHC.   Expected Discharge Date:   (unknown)               Expected Discharge Plan:  [place]  In-House Referral:     Discharge planning Services  CM Consult  Post Acute Care Choice:    Choice offered to:  Patient  DME Arranged:  N/A DME Agency:  NA, [place]:  PT [place] Agency:     Status of Service:  In process, will continue to follow  Medicare Important Message Given:    Date Medicare IM Given:    Medicare IM give by:    Date Additional Medicare IM Given:    Additional Medicare Important Message give by:     If discussed at [place] of Stay Meetings, dates discussed:    Additional Comments:  Dessa Phi, RN 09/08/2014, 12:46 PM

## 2014-09-08 NOTE — Consult Note (Signed)
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 Left side of gluteal cleft. Measurement:6.5cm x 5.5cm with deep purple, maroon discoloration. Top layer of epidermis is sloughing off. Wound bed:As described above. Drainage (amount, consistency, odor) scant drainage on old dressing consistent with autolytic debridement. Periwound:Intact Dressing procedure/placement/frequency:Soft silicone foam dressing in place. Bedside RN in to observe wound assessment.  Patient is now ambulatory (walked three times today) and is changing position independently in bed. She has a pressure redistribution chair cushion in her chair.  Understands that this ulcer will take several weeks if not months to heal. A therapeutic mattress with low air loss feature is not indicated at this time.   WOC ostomy follow up Stoma type/location: LLQ Colostomy Stomal assessment/size: Slightly less than 2 inches round, pink, moist, budded Peristomal assessment: intact, clear Treatment options for stomal/peristomal skin: skin barrier ring to accommodate exposed skin. Output: small amount of serous effluent. No flatus or stool Ostomy pouching: 2pc. 2 and 3/4 inch pouching system, skin barrier ring Education provided: patient observes removal of old pouching system, cleansing of skin and resizing of ostomy using sizing guide and noting that it is still a 2-inch stoma.  Observes preparing of ostomy skin barrier, placement of skin barrier ring and attachment of pouch.  Patient expresses concern that her peripheral neuropathy will prevent her from squeezing together pouch and skin barrier and is reassured that she will be able to place the 2 piece pouching system together prior to applying as a 1-piece system. Emptying is demonstrated ad patient is more confident and hopeful than previously (per bedside RN). Patient has educational booklet on her bedside and will look at it this weekend.  Next  visit planned for 1pm on Sunday with patient and her husband. Enrolled patient in [facility] Discharge program: Yes  [place] nursing team will follow, and will remain available to this patient, the nursing, surgical and medical teams.   Thanks, Maudie Flakes, MSN, RN, [place], [place], [place] [phone])

## 2014-09-09 LAB — BASIC METABOLIC PANEL
ANION GAP: 4 — AB (ref 5–15)
BUN: 14 mg/dL (ref 6–20)
CO2: 26 mmol/L (ref 22–32)
Calcium: 7.8 mg/dL — ABNORMAL LOW (ref 8.9–10.3)
Chloride: 104 mmol/L (ref 101–111)
Creatinine, Ser: 0.32 mg/dL — ABNORMAL LOW (ref 0.44–1.00)
Glucose, Bld: 123 mg/dL — ABNORMAL HIGH (ref 65–99)
Potassium: 3.8 mmol/L (ref 3.5–5.1)
SODIUM: 134 mmol/L — AB (ref 135–145)

## 2014-09-09 LAB — GLUCOSE, CAPILLARY
GLUCOSE-CAPILLARY: 105 mg/dL — AB (ref 65–99)
GLUCOSE-CAPILLARY: 130 mg/dL — AB (ref 65–99)
Glucose-Capillary: 112 mg/dL — ABNORMAL HIGH (ref 65–99)
Glucose-Capillary: 119 mg/dL — ABNORMAL HIGH (ref 65–99)
Glucose-Capillary: 121 mg/dL — ABNORMAL HIGH (ref 65–99)
Glucose-Capillary: 122 mg/dL — ABNORMAL HIGH (ref 65–99)

## 2014-09-09 LAB — MAGNESIUM: MAGNESIUM: 1.9 mg/dL (ref 1.7–2.4)

## 2014-09-09 MED ORDER — INSULIN ASPART 100 UNIT/ML ~~LOC~~ SOLN
0.0000 [IU] | Freq: Four times a day (QID) | SUBCUTANEOUS | Status: DC
  Administered 2014-09-09 – 2014-09-17 (×5): 2 [IU] via SUBCUTANEOUS

## 2014-09-09 MED ORDER — TRACE MINERALS CR-CU-MN-SE-ZN 10-1000-500-60 MCG/ML IV SOLN
INTRAVENOUS | Status: AC
  Administered 2014-09-09: 17:00:00 via INTRAVENOUS
  Filled 2014-09-09: qty 1440

## 2014-09-09 MED ORDER — FAT EMULSION 20 % IV EMUL
240.0000 mL | INTRAVENOUS | Status: AC
  Administered 2014-09-09: 240 mL via INTRAVENOUS
  Filled 2014-09-09: qty 250

## 2014-09-09 MED ORDER — MAGNESIUM SULFATE IN D5W 10-5 MG/ML-% IV SOLN
1.0000 g | Freq: Once | INTRAVENOUS | Status: AC
  Administered 2014-09-09: 1 g via INTRAVENOUS
  Filled 2014-09-09: qty 100

## 2014-09-09 NOTE — Progress Notes (Addendum)
PARENTERAL NUTRITION CONSULT NOTE - Follow-up  Pharmacy Consult for TPN Indication: Persistent SBO  Allergies  Allergen Reactions  . Avelox [Moxifloxacin Hcl In Nacl] Hives  . Codeine Hives  . Dextromethorphan Hives  . Erythromycin Hives  . Penicillins Hives    No problem with Imipenem  . Suprep [Na Sulfate-K Sulfate-Mg Sulf] Nausea And Vomiting    Patient Measurements: Height: _0  (170.2 cm) Weight: 109 lb 9.1 oz (49.7 kg) IBW/kg (Calculated) : 61.6 Usual Weight: 50.4 kg  Vital Signs: Temp: 98.3 F (36.8 C) (06/11 0530) Temp Source: Oral (06/11 0530) BP: 120/55 mmHg (06/11 0530) Pulse Rate: 63 (06/11 0530) Intake/Output from previous day: 06/10 0701 - 06/11 0700 In: 2815.5 [P.O.:120; I.V.:1504.3; TPN:1191.2] Out: 12 [Drains:12]  Labs: No results for input(s): WBC, HGB, HCT, PLT, APTT, INR in the last 72 hours.   Recent Labs  09/07/14 0500 09/08/14 0515 09/09/14 0540  NA 133* 134* 134*  K 3.7 4.3 3.8  CL 101 101 104  CO2 _1 GLUCOSE 144* 118* 123*  BUN _2 CREATININE 0.39* 0.42* 0.32*  CALCIUM 7.7* 8.0* 7.8*  MG 1.7 1.8 1.9  PHOS 3.6  --   --   PROT 4.6*  --   --   ALBUMIN 1.7*  --   --   AST 22  --   --   ALT 13*  --   --   ALKPHOS 116  --   --   BILITOT 0.3  --   --    Estimated Creatinine Clearance: 60.9 mL/min (by C-G formula based on Cr of 0.32).    Recent Labs  09/08/14 1953 09/09/14 0014 09/09/14 0412  GLUCAP 114* 119* 112*   Medications, infusions:  . sodium chloride 75 mL/hr at 09/09/14 0453  . Marland KitchenTPN (CLINIMIX-E) Adult 60 mL/hr at 09/08/14 1835   And  . fat emulsion 240 mL (09/08/14 1835)  . Marland KitchenTPN (CLINIMIX-E) Adult     And  . fat emulsion     Insulin Requirements: Moderate SSI q4h: 2 units / 24 hours.  Current Nutrition: clear liquid diet, Resource Breeze supplement TID   IVF: NS at 75 ml/hr  Central access: CVC triple lumen, port L chest TPN start date: 6/1  ASSESSMENT                                                                                                           HPI: 25yoF with PMH metastatic rectal cancer and rectal stent 06/2014, s/p 3 cycles chemo presents 5/27 with N/V x 1 day.  Found to have SBO, likely secondary to pelvic tumor on CT.  NG tube placed and later removed once SBO improved on Abd XR.  Started on CL diet, but persistent N/V prevent meaningful PO intake.  To start on TPN per pharmacy for nutritional supplementation until tolerating PO.  Significant events:  5/31: patient misunderstood Surgeon to imply that TPN was not going to start today and did not allow TPN to be initiated. 6/2: s/p exp laparotomy, SB resection, LAR,  stent, colostomy, appendectomy, hysterectomy, salpingo-oophorectomy, transverse colon resection, debulking of peritoneum. 6/6: clamp NG tube 6/7: NGT d/c'ed 6/9: started CLD  Today:   Glucose (goal <150): No Hx DM.  cbgs <150  Electrolytes: Na slightly low at 134, Mag on low end of normal range at 1.9, K+ WNL, Corrected Ca WNL  Renal: SCr low, stable  LFTs: AST WNL, ALT low, Alk Phos and Tbili WNL (6/9)  TGs: elevated at baseline at 226 (6/1), 74 (6/6)  Prealbumin: 13.2 (6/1), 6 (6/6)  NUTRITIONAL GOALS                                                                                             RD recs: Nutrition needs: 1600-1800 kcal, 70-80 grams protein.  Clinimix 5/20 @ 60 mL/hr + 20% IV fat emulsion at 10 ml/hr to provide 72 grams protein and 1746 kcal   PLAN                                                                                                                          Magnesium Sulfate 1g IV x 1  Change CBGs and moderate SSI to q6h. At 1800 today:  Continue Clinimix E 5/20 IV at 60 ml/hr.  Continue  20% IV fat emulsion at 10 ml/hr.  TPN to contain standard multivitamins and trace elements.  IVF per MD orders, NS at 75 ml/hr.    TPN lab panels on Mondays & Thursdays.  BMET, Mag in AM.  F/u ability to tolerate  diet.  Lindell Spar, PharmD, BCPS Pager: [phone] 09/09/2014 7:56 AM

## 2014-09-09 NOTE — Progress Notes (Signed)
Patient ID: Sydney Morrison, female   DOB: 1956/02/21, 59 y.o.   MRN: [id number] 9 Days Post-Op  Subjective: Pt continues to have no flatus, but also not having nausea/vomiting/belching.  Has been walking up and down hall already today.    Objective: Vital signs in last 24 hours: Temp:  [98.1 F (36.7 C)-98.7 F (37.1 C)] 98.3 F (36.8 C) (06/11 0530) Pulse Rate:  [63-69] 63 (06/11 0530) Resp:  [12-23] 12 (06/11 0800) BP: (114-120)/(48-59) 120/55 mmHg (06/11 0530) SpO2:  [95 %-99 %] 98 % (06/11 0800) Weight:  [49.7 kg (109 lb 9.1 oz)] 49.7 kg (109 lb 9.1 oz) (06/11 0530) Last BM Date:  (prior to surgery. )  No stool recorded Afebrile, VSS Labs OK  Intake/Output from previous day: 06/10 0701 - 06/11 0700 In: 2815.5 [P.O.:120; I.V.:1504.3; TPN:1191.2] Out: 12 [Drains:12] Intake/Output this shift:    General appearance: alert, cooperative and no distress Resp: breathing comfortably GI: dressing c/d/i, ostomy is still edematous, sweat in bag, no gas to speak of.    Lab Results:  No results for input(s): WBC, HGB, HCT, PLT in the last 72 hours.  BMET  Recent Labs  09/08/14 0515 09/09/14 0540  NA 134* 134*  K 4.3 3.8  CL 101 104  CO2 25 26  GLUCOSE 118* 123*  BUN 13 14  CREATININE 0.42* 0.32*  CALCIUM 8.0* 7.8*   PT/INR No results for input(s): LABPROT, INR in the last 72 hours.   Recent Labs Lab 09/04/14 0500 09/07/14 0500  AST 22 22  ALT 12* 13*  ALKPHOS 78 116  BILITOT 0.3 0.3  PROT 4.6* 4.6*  ALBUMIN 1.7* 1.7*     Lipase     Component Value Date/Time   LIPASE 30 08/24/2014 0510     Studies/Results: No results found.  Medications: . feeding supplement (RESOURCE BREEZE)  1 Container Oral TID BM  . HYDROmorphone PCA 0.3 mg/mL   Intravenous 6 times per day  . insulin aspart  0-15 Units Subcutaneous 4 times per day  . magnesium sulfate 1 - 4 g bolus IVPB  1 g Intravenous Once  . nicotine  21 mg Transdermal Daily  . pantoprazole (PROTONIX) IV  40  mg Intravenous QHS  . scopolamine  1 patch Transdermal Q72H  . sodium chloride  10-40 mL Intracatheter Q12H  . sodium chloride  3 mL Intravenous Q12H   . sodium chloride 75 mL/hr at 09/09/14 0453  . Marland KitchenTPN (CLINIMIX-E) Adult 60 mL/hr at 09/08/14 1835   And  . fat emulsion 240 mL (09/08/14 1835)  . Marland KitchenTPN (CLINIMIX-E) Adult     And  . fat emulsion      Assessment/Plan 1. Rectal cancer, Liver metastasis, rectal stent 06/2014 2. Small Bowel Obstruction, Rectosigmoid cancer with perforation, Pelvic carcinomatosis, Omental central caking/carcinoimatosis, Liver metatasis S/P EXPLORATORY LAPAROTOMY, SMALL BOWEL RESECTION LOW ANTERIOR RECTOSIGMOID RESECTION (including stent) with COLOSTOMY APPENDECTOMY, HYSTERECTOMY SUPRACERVICAL ABDOMINAL Salpingo-oophorectomy TRANSVERSE COLON RESECTION WITH PARTIAL OMENTECTOMY DEBULKING OF PERITONEUM 09/06/14, Dr. Johney Maine. 2B  Post op ileus 3. Sepsis 4. Anemia/thrombocytopenia 5. Severe protein calorie malnutrition  - TNA 6. Hx of tobacco use 7. Stage II pressure sores 8. Antibiotics: Day 13 Primaxin 9. DVT: SCD/ No heparin secondary to anemia thrombocytopenia    Plan:   Continue TNA Await return of bowel function. Continue PT/ambulation   LOS: 15 days    Jamara Vary 09/09/2014

## 2014-09-09 NOTE — Progress Notes (Signed)
[facility] PROGRESS NOTE  Sydney Morrison UKG:[id number] DOB: 08-Sep-1955 DOA: 08/25/2014 PCP: Delphina Cahill, MD  Assessment/Plan: Small bowel obstruction - Had initially failed conservative management.  - Surgery following and pt underwent exploratory laparotomy on 6/2 with lower anterior rectosigmoid resection with colostomy.  - Pt is s/p appendectomy, hysterectomy, and salpingoophorectomy, and debulking of peritoneum. - Remains on Dilaudid PCA for pain. On TNA.  - Continue ambulation as tolerated per Surgery - Continue to await return of bowel function - Pt is apprehensive about trying clears and has not ordered clear tray. She denies nausea but is afraid of trying diet for fears of nausea. Pt encouraged to try clear diet as tolerated.  Sepsis secondary to possible PNA - Patient febrile recently with progressive leukocytosis.  - Repeat chest x-ray showing new right lung base opacity - UA unremarkable - Had continued on Primaxin. Was on IV Flagyl From 5/27-5/31. - ID since consulted. Discussed with ID with recommendations to d/c abx as of 6/8 - If sepsis worsens or if pt becomes febrile, ID recommends CT abd and to resume abx at that time - ID has since signed off - Remains afebrile  Rectal ca with liver mets -sees Dr Benay Spice. S/p 3 cycles of chemo.  -Dr. Benay Spice to follow-up as outpatient.  Chest pain and Aflutter with RVR -cardiology was consulted.  - 2D echo with normal EF - Cardiology has since signed off.  Hypokalemia/ hypophsphatemia/ hypomagnesemia - Replace as needed - Cont to monitor  Thrombocytopenia -avoid heparin products. Monitor closely.  Stage II pressure ulcer - No signs of infection - Pink foam applied.  Tobacco abuse - Continue nicotine patch as tolerated.  Code Status: Full Family Communication: Pt in room Disposition Plan: Pending  Consultants:  General Surgery  Cardiology  Infectious Disease  Procedures: 6/2: EXPLORATORY  LAPAROTOMY SMALL BOWEL RESECTION LOW ANTERIOR RECTOSIGMOID RESECTION (including stent) with COLOSTOMY APPENDECTOMY HYSTERECTOMY SUPRACERVICAL ABDOMINAL Salpingo-oophorectomy TRANSVERSE COLON RESECTION WITH PARTIAL OMENTECTOMY DEBULKING OF PERITONEUM  Antibiotics:  Primaxin 5/27>>>6/8  HPI/Subjective: Apprehensive about trying clear liquid diet. Did not order clear try today because she was concerned about feeling nauseated. Denies nausea currently. Tolerating sips of water.  Objective: Filed Vitals:   09/09/14 0530 09/09/14 0800 09/09/14 1012 09/09/14 1254  BP: 120/55  117/64   Pulse: 63  65   Temp: 98.3 F (36.8 C)  98 F (36.7 C)   TempSrc: Oral  Oral   Resp: 22 12 16 20   Height:      Weight: 49.7 kg (109 lb 9.1 oz)     SpO2: 95% 98% 97% 98%    Intake/Output Summary (Last 24 hours) at 09/09/14 1322 Last data filed at 09/09/14 0700  Gross per 24 hour  Intake 2695.5 ml  Output     12 ml  Net 2683.5 ml   Filed Weights   09/07/14 0500 09/08/14 0518 09/09/14 0530  Weight: 53 kg (116 lb 13.5 oz) 52.2 kg (115 lb 1.3 oz) 49.7 kg (109 lb 9.1 oz)    Exam:   General:  Awake, laying in bed, in nad  Cardiovascular: regular, s1, s2  Respiratory: normal resp effort, no wheezing  Abdomen: soft,nontender, decreased BS  Musculoskeletal: perfused, no clubbing, no cyanosis  Data Reviewed: Basic Metabolic Panel:  Recent Labs Lab 09/03/14 0500 09/04/14 0500 09/06/14 0445 09/07/14 0500 09/08/14 0515 09/09/14 0540  NA 136 135 132* 133* 134* 134*  K 3.3* 3.7 3.5 3.7 4.3 3.8  CL 100* 99* 99* 101 101 104  CO2 30 29 26 26 25 26   GLUCOSE 141* 136* 116* 144* 118* 123*  BUN 20 20 12 12 13 14   CREATININE 0.31* <0.30* 0.37* 0.39* 0.42* 0.32*  CALCIUM 7.5* 7.8* 7.7* 7.7* 8.0* 7.8*  MG 1.6* 1.5* 1.5* 1.7 1.8 1.9  PHOS 2.2* 2.6  --  3.6  --   --    Liver Function Tests:  Recent Labs Lab 09/04/14 0500 09/07/14 0500  AST 22 22  ALT 12* 13*  ALKPHOS 78 116  BILITOT  0.3 0.3  PROT 4.6* 4.6*  ALBUMIN 1.7* 1.7*   No results for input(s): LIPASE, AMYLASE in the last 168 hours. No results for input(s): AMMONIA in the last 168 hours. CBC:  Recent Labs Lab 09/03/14 1000 09/04/14 0500 09/05/14 0400 09/06/14 0445  WBC 23.0* 25.2* 22.3* 11.5*  NEUTROABS  --  21.9*  --   --   HGB 7.2* 7.2* 6.8* 7.6*  HCT 22.4* 22.4* 21.3* 23.5*  MCV 87.8 85.5 86.2 86.1  PLT 64* 72* 71* 81*   Cardiac Enzymes: No results for input(s): CKTOTAL, CKMB, CKMBINDEX, TROPONINI in the last 168 hours. BNP (last 3 results) No results for input(s): BNP in the last 8760 hours.  ProBNP (last 3 results) No results for input(s): PROBNP in the last 8760 hours.  CBG:  Recent Labs Lab 09/08/14 1953 09/09/14 0014 09/09/14 0412 09/09/14 0728 09/09/14 1147  GLUCAP 114* 119* 112* 122* 121*    Recent Results (from the past 240 hour(s))  Surgical pcr screen     Status: None   Collection Time: 08/30/14 10:08 PM  Result Value Ref Range Status   MRSA, PCR NEGATIVE NEGATIVE Final   Staphylococcus aureus NEGATIVE NEGATIVE Final    Comment:        The Xpert SA Assay (FDA approved for NASAL specimens in patients over 75 years of age), is one component of a comprehensive surveillance program.  Test performance has been validated by [facility] for patients greater than or equal to 13 year old. It is not intended to diagnose infection nor to guide or monitor treatment.   Culture, blood (routine x 2)     Status: None (Preliminary result)   Collection Time: 09/04/14  7:58 AM  Result Value Ref Range Status   Specimen Description BLOOD LEFT ARM  Final   Special Requests BOTTLES DRAWN AEROBIC ONLY 3CC  Final   Culture   Final           BLOOD CULTURE RECEIVED NO GROWTH TO DATE CULTURE WILL BE HELD FOR 5 DAYS BEFORE ISSUING A FINAL NEGATIVE REPORT Performed at [facility]    Report Status PENDING  Incomplete  Culture, blood (routine x 2)     Status: None (Preliminary  result)   Collection Time: 09/04/14  8:00 AM  Result Value Ref Range Status   Specimen Description RIGHT ANTECUBITAL  Final   Special Requests BOTTLES DRAWN AEROBIC AND ANAEROBIC 5CC  Final   Culture   Final           BLOOD CULTURE RECEIVED NO GROWTH TO DATE CULTURE WILL BE HELD FOR 5 DAYS BEFORE ISSUING A FINAL NEGATIVE REPORT Performed at [facility]    Report Status PENDING  Incomplete     Studies: No results found.  Scheduled Meds: . feeding supplement (RESOURCE BREEZE)  1 Container Oral TID BM  . HYDROmorphone PCA 0.3 mg/mL   Intravenous 6 times per day  . insulin aspart  0-15 Units Subcutaneous 4  times per day  . nicotine  21 mg Transdermal Daily  . pantoprazole (PROTONIX) IV  40 mg Intravenous QHS  . scopolamine  1 patch Transdermal Q72H  . sodium chloride  10-40 mL Intracatheter Q12H  . sodium chloride  3 mL Intravenous Q12H   Continuous Infusions: . sodium chloride 75 mL/hr at 09/09/14 0453  . Marland KitchenTPN (CLINIMIX-E) Adult 60 mL/hr at 09/08/14 1835   And  . fat emulsion 240 mL (09/08/14 1835)  . Marland KitchenTPN (CLINIMIX-E) Adult     And  . 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 If 7PM-7AM, please contact night-coverage at [email or web address], password [facility] 09/09/2014, 1:22 PM  LOS: 15 days

## 2014-09-10 LAB — GLUCOSE, CAPILLARY
GLUCOSE-CAPILLARY: 119 mg/dL — AB (ref 65–99)
Glucose-Capillary: 122 mg/dL — ABNORMAL HIGH (ref 65–99)
Glucose-Capillary: 118 mg/dL — ABNORMAL HIGH (ref 65–99)

## 2014-09-10 LAB — BASIC METABOLIC PANEL
Anion gap: 7 (ref 5–15)
BUN: 13 mg/dL (ref 6–20)
CHLORIDE: 104 mmol/L (ref 101–111)
CO2: 27 mmol/L (ref 22–32)
Calcium: 8.1 mg/dL — ABNORMAL LOW (ref 8.9–10.3)
Creatinine, Ser: 0.39 mg/dL — ABNORMAL LOW (ref 0.44–1.00)
GFR calc Af Amer: >60 mL/min (ref >60)
Glucose, Bld: 124 mg/dL — ABNORMAL HIGH (ref 65–99)
Potassium: 3.8 mmol/L (ref 3.5–5.1)
Sodium: 138 mmol/L (ref 135–145)

## 2014-09-10 LAB — CULTURE, BLOOD (ROUTINE X 2)
Culture: NO GROWTH
Culture: NO GROWTH

## 2014-09-10 LAB — MAGNESIUM: Magnesium: 1.8 mg/dL (ref 1.7–2.4)

## 2014-09-10 MED ORDER — BISACODYL 10 MG RE SUPP
10.0000 mg | Freq: Every day | RECTAL | Status: DC
  Administered 2014-09-10: 10 mg via RECTAL
  Filled 2014-09-10 (×2): qty 1

## 2014-09-10 MED ORDER — MAGNESIUM SULFATE IN D5W 10-5 MG/ML-% IV SOLN
1.0000 g | Freq: Once | INTRAVENOUS | Status: AC
  Administered 2014-09-10: 1 g via INTRAVENOUS
  Filled 2014-09-10: qty 100

## 2014-09-10 MED ORDER — TRACE MINERALS CR-CU-MN-SE-ZN 10-1000-500-60 MCG/ML IV SOLN
INTRAVENOUS | Status: AC
  Administered 2014-09-10: 18:00:00 via INTRAVENOUS
  Filled 2014-09-10: qty 1440

## 2014-09-10 MED ORDER — FAT EMULSION 20 % IV EMUL
240.0000 mL | INTRAVENOUS | Status: AC
  Administered 2014-09-10: 240 mL via INTRAVENOUS
  Filled 2014-09-10: qty 250

## 2014-09-10 NOTE — Plan of Care (Signed)
Problem: Phase II Progression Outcomes Goal: Return of bowel function (flatus, BM) IF ABDOMINAL SURGERY:  Outcome: Progressing Pt is starting to have feces form in her ostomy bag as of 09/10/14

## 2014-09-10 NOTE — Progress Notes (Signed)
PARENTERAL NUTRITION CONSULT NOTE - Follow-up  Pharmacy Consult for TPN Indication: Persistent SBO  Allergies  Allergen Reactions  . Avelox [Moxifloxacin Hcl In Nacl] Hives  . Codeine Hives  . Dextromethorphan Hives  . Erythromycin Hives  . Penicillins Hives    No problem with Imipenem  . Suprep [Na Sulfate-K Sulfate-Mg Sulf] Nausea And Vomiting    Patient Measurements: Height: 5' 7"  (170.2 cm) Weight: 109 lb 9.1 oz (49.7 kg) IBW/kg (Calculated) : 61.6 Usual Weight: 50.4 kg  Vital Signs: Temp: 98.5 F (36.9 C) (06/12 0547) Temp Source: Oral (06/12 0547) BP: 114/58 mmHg (06/12 0547) Pulse Rate: 61 (06/12 0547) Intake/Output from previous day: 06/11 0701 - 06/12 0700 In: 2853.8 [P.O.:240; I.V.:1773.8; TPN:840] Out: 12 [Drains:12]  Labs: No results for input(s): WBC, HGB, HCT, PLT, APTT, INR in the last 72 hours.   Recent Labs  09/08/14 0515 09/09/14 0540 09/10/14 0410  NA 134* 134* 138  K 4.3 3.8 3.8  CL 101 104 104  CO2 25 26 27   GLUCOSE 118* 123* 124*  BUN 13 14 13   CREATININE 0.42* 0.32* 0.39*  CALCIUM 8.0* 7.8* 8.1*  MG 1.8 1.9 1.8   Estimated Creatinine Clearance: 60.9 mL/min (by C-G formula based on Cr of 0.39).    Recent Labs  09/09/14 1610 09/09/14 2343 09/10/14 0545  GLUCAP 105* 130* 122*   Medications, infusions:  . sodium chloride 75 mL/hr at 09/10/14 0822  . Marland KitchenTPN (CLINIMIX-E) Adult 60 mL/hr at 09/09/14 1728   And  . fat emulsion 240 mL (09/09/14 1728)   Insulin Requirements: Moderate SSI q6h: 6 units / 24 hours.  Current Nutrition: clear liquid diet, Resource Breeze supplement TID (pt refusing)  IVF: NS at 75 ml/hr  Central access: CVC triple lumen, port L chest TPN start date: 6/1  ASSESSMENT                                                                                                          HPI: 67yoF with PMH metastatic rectal cancer and rectal stent 06/2014, s/p 3 cycles chemo presents 5/27 with N/V x 1 day.  Found to have  SBO, likely secondary to pelvic tumor on CT.  NG tube placed and later removed once SBO improved on Abd XR.  Started on CL diet, but persistent N/V prevent meaningful PO intake.  To start on TPN per pharmacy for nutritional supplementation until tolerating PO.  Significant events:  5/31: patient misunderstood Surgeon to imply that TPN was not going to start today and did not allow TPN to be initiated. 6/2: s/p exp laparotomy, SB resection, LAR, stent, colostomy, appendectomy, hysterectomy, salpingo-oophorectomy, transverse colon resection, debulking of peritoneum. 6/6: clamp NG tube 6/7: NGT d/c'ed 6/9: started CLD  Today:   Glucose (goal <150): No Hx DM.  cbgs <150  Electrolytes: Na improved to WNL, Mag on low end of normal range at 1.8, K+ WNL, Corrected Ca WNL  Renal: SCr low, stable  LFTs: AST WNL, ALT low, Alk Phos and Tbili WNL (6/9)  TGs: elevated at baseline at 226 (  6/1), 74 (6/6)  Prealbumin: 13.2 (6/1), 6 (6/6)  NUTRITIONAL GOALS                                                                                             RD recs: Nutrition needs: 1600-1800 kcal, 70-80 grams protein.  Clinimix 5/20 @ 60 mL/hr + 20% IV fat emulsion at 10 ml/hr to provide 72 grams protein and 1746 kcal   PLAN                                                                                                                          Magnesium Sulfate 1g IV x 1. At 1800 today:  Continue Clinimix E 5/20 IV at goal rate of 60 ml/hr.  Continue  20% IV fat emulsion at 10 ml/hr.  TPN to contain standard multivitamins and trace elements.  IVF per MD orders, NS at 75 ml/hr.    Continue CBGs and moderate SSI q6h.  TPN lab panels on Mondays & Thursdays.  F/u ability to tolerate diet.   Lindell Spar, PharmD, BCPS Pager: [phone] 09/10/2014 8:29 AM

## 2014-09-10 NOTE — Progress Notes (Signed)
[facility] PROGRESS NOTE  DESSIREE SZE JGG:[id number] DOB: 1955/12/08 DOA: 08/25/2014 PCP: Delphina Cahill, MD  Assessment/Plan: Small bowel obstruction - Had initially failed conservative management.  - Surgery following and pt underwent exploratory laparotomy on 6/2 with lower anterior rectosigmoid resection with colostomy.  - Pt is s/p appendectomy, hysterectomy, and salpingoophorectomy, and debulking of peritoneum. - Remains on Dilaudid PCA for pain. On TNA.  - Continue ambulation as tolerated per Surgery - Continue to await return of bowel function - Tolerated small amounts of clears this AM.   Sepsis secondary to possible PNA - Patient febrile recently with progressive leukocytosis.  - Repeat chest x-ray showing new right lung base opacity - UA unremarkable - Had continued on Primaxin. Was on IV Flagyl From 5/27-5/31. - ID since consulted. Discussed with ID with recommendations to d/c abx as of 6/8 - Cont to monitor and if sepsis worsens or if pt becomes febrile, ID recommends CT abd and to resume abx at that time - ID has since signed off - Remains afebrile  Rectal ca with liver mets -sees Dr Benay Spice. S/p 3 cycles of chemo.  -Dr. Benay Spice to follow-up as outpatient.  Chest pain and Aflutter with RVR -cardiology was consulted.  - 2D echo with normal EF - Cardiology has since signed off.  Hypokalemia/ hypophsphatemia/ hypomagnesemia - Cont to replace as needed - Cont to monitor  Thrombocytopenia -avoid heparin products. Monitor closely.  Stage II pressure ulcer - No signs of infection - Pink foam applied. - See WOC note. Recs to involve surgery regarding pressure sore - Surgery continues to follow  Tobacco abuse - Continue nicotine patch as tolerated.  Code Status: Full Family Communication: Pt in room, family at bedside Disposition Plan: Pending  Consultants:  General Surgery  Cardiology  Infectious Disease  Procedures: 6/2: EXPLORATORY  LAPAROTOMY SMALL BOWEL RESECTION LOW ANTERIOR RECTOSIGMOID RESECTION (including stent) with COLOSTOMY APPENDECTOMY HYSTERECTOMY SUPRACERVICAL ABDOMINAL Salpingo-oophorectomy TRANSVERSE COLON RESECTION WITH PARTIAL OMENTECTOMY DEBULKING OF PERITONEUM  Antibiotics:  Primaxin 5/27>>>6/8  HPI/Subjective: No complaints. Tolerated small amount to clears this AM,  Objective: Filed Vitals:   09/10/14 0800 09/10/14 1018 09/10/14 1200 09/10/14 1445  BP:  137/65  144/64  Pulse:  74  81  Temp:  98.4 F (36.9 C)  98.5 F (36.9 C)  TempSrc:  Oral  Oral  Resp: 18 14 14 20   Height:      Weight:      SpO2: 99% 100% 100% 100%    Intake/Output Summary (Last 24 hours) at 09/10/14 1525 Last data filed at 09/10/14 0919  Gross per 24 hour  Intake 2353.75 ml  Output     12 ml  Net 2341.75 ml   Filed Weights   09/08/14 0518 09/09/14 0530 09/10/14 0547  Weight: 52.2 kg (115 lb 1.3 oz) 49.7 kg (109 lb 9.1 oz) 49.7 kg (109 lb 9.1 oz)    Exam:   General:  Awake, laying in bed, in nad, [place] in place  Cardiovascular: regular, s1, s2  Respiratory: normal resp effort, no wheezing  Abdomen: soft,nontender, decreased BS  Musculoskeletal: perfused, no clubbing, no cyanosis  Data Reviewed: Basic Metabolic Panel:  Recent Labs Lab 09/04/14 0500 09/06/14 0445 09/07/14 0500 09/08/14 0515 09/09/14 0540 09/10/14 0410  NA 135 132* 133* 134* 134* 138  K 3.7 3.5 3.7 4.3 3.8 3.8  CL 99* 99* 101 101 104 104  CO2 29 26 26 25 26 27   GLUCOSE 136* 116* 144* 118* 123* 124*  BUN 20 12  12 13 14 13   CREATININE <0.30* 0.37* 0.39* 0.42* 0.32* 0.39*  CALCIUM 7.8* 7.7* 7.7* 8.0* 7.8* 8.1*  MG 1.5* 1.5* 1.7 1.8 1.9 1.8  PHOS 2.6  --  3.6  --   --   --    Liver Function Tests:  Recent Labs Lab 09/04/14 0500 09/07/14 0500  AST 22 22  ALT 12* 13*  ALKPHOS 78 116  BILITOT 0.3 0.3  PROT 4.6* 4.6*  ALBUMIN 1.7* 1.7*   No results for input(s): LIPASE, AMYLASE in the last 168 hours. No results  for input(s): AMMONIA in the last 168 hours. CBC:  Recent Labs Lab 09/04/14 0500 09/05/14 0400 09/06/14 0445  WBC 25.2* 22.3* 11.5*  NEUTROABS 21.9*  --   --   HGB 7.2* 6.8* 7.6*  HCT 22.4* 21.3* 23.5*  MCV 85.5 86.2 86.1  PLT 72* 71* 81*   Cardiac Enzymes: No results for input(s): CKTOTAL, CKMB, CKMBINDEX, TROPONINI in the last 168 hours. BNP (last 3 results) No results for input(s): BNP in the last 8760 hours.  ProBNP (last 3 results) No results for input(s): PROBNP in the last 8760 hours.  CBG:  Recent Labs Lab 09/09/14 0728 09/09/14 1147 09/09/14 1610 09/09/14 2343 09/10/14 0545  GLUCAP 122* 121* 105* 130* 122*    Recent Results (from the past 240 hour(s))  Culture, blood (routine x 2)     Status: None   Collection Time: 09/04/14  7:58 AM  Result Value Ref Range Status   Specimen Description BLOOD LEFT ARM  Final   Special Requests BOTTLES DRAWN AEROBIC ONLY 3CC  Final   Culture   Final    NO GROWTH 5 DAYS Performed at [facility]    Report Status 09/10/2014 FINAL  Final  Culture, blood (routine x 2)     Status: None   Collection Time: 09/04/14  8:00 AM  Result Value Ref Range Status   Specimen Description RIGHT ANTECUBITAL  Final   Special Requests BOTTLES DRAWN AEROBIC AND ANAEROBIC 5CC  Final   Culture   Final    NO GROWTH 5 DAYS Performed at [facility]    Report Status 09/10/2014 FINAL  Final     Studies: No results found.  Scheduled Meds: . bisacodyl  10 mg Rectal Daily  . feeding supplement (RESOURCE BREEZE)  1 Container Oral TID BM  . HYDROmorphone PCA 0.3 mg/mL   Intravenous 6 times per day  . insulin aspart  0-15 Units Subcutaneous 4 times per day  . nicotine  21 mg Transdermal Daily  . pantoprazole (PROTONIX) IV  40 mg Intravenous QHS  . scopolamine  1 patch Transdermal Q72H  . sodium chloride  10-40 mL Intracatheter Q12H  . sodium chloride  3 mL Intravenous Q12H   Continuous Infusions: . sodium chloride 75  mL/hr at 09/10/14 0822  . Marland KitchenTPN (CLINIMIX-E) Adult 60 mL/hr at 09/09/14 1728   And  . fat emulsion 240 mL (09/09/14 1728)  . Marland KitchenTPN (CLINIMIX-E) Adult     And  . 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 If 7PM-7AM, please contact night-coverage at [email or web address], password [facility] 09/10/2014, 3:25 PM  LOS: 16 days

## 2014-09-10 NOTE — Consult Note (Signed)
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 Dressing procedure/placement/frequency: Soft silicone dressing changed today after showing injury to husband and MD/Chiu.  Possible treatment plans discussed as it may be important to resolve this wound prior to aggressive chemotherapy.  Possible treatment plans include application of an enzymatic debriding agent and use of hydrotherapy to unroof area and get to wound bed so that topical therapy can be initiated. CCS to be consulted for this deep tissue pressure injury by Dr. Wyline Copas.  Patient is keeping off of area by ambulating several times a day and spending time in a chair upon a pressure redistribution chair cushion. At this time, given that activity, I have not provided a pressure redistribution mattress.  WOC ostomy follow up Stoma type/location: LLQ colostomy Stomal assessment/size: 1 and 3/4 inches round, budded, edematous, moist Peristomal assessment: intact Treatment options for stomal/peristomal skin: skin barrier ring (due to stoma size changing anticipated) Output mucous only. Today a suppository is ordered to stimulate bowel activity. Bedside RN asks if I can be a part of that administration today and I am delighted to do so. All are instructed on the administration of intrastromal medication administration.  Suppository inserted and held in place for 10 minutes until (mostly) dissolved. Ostomy pouching: 2pc, 2 and 3/4 inch pouching system with skin barrier ring.  Education provided: Patient and husband participated in teaching session where stoma sizing, pouch preparation, pouching system removal, application of skin barrier ring and pouching  system application are demonstrated and taught. ADLs such as showering and bathing, activity (progressive) and accessories (such as closed end pouches, pouches with gas filters, etc.) reviewed.  Further explanation of Secure Start provided as well as contact information for [place] Nurse if needed in the post acute period. Enrolled patient in [place] Start Discharge program: Yes  [place] nursing team will follow, and will remain available to this patient, the nursing, surgical and medical teams.   Thanks, Maudie Flakes, MSN, RN, [place], [place], [place] [phone])

## 2014-09-10 NOTE — Progress Notes (Signed)
Pt. Is starting to have feces come from her ostomy into her bag. She wanted to make sure I made a note of this in her records. Carmela Hurt, RN

## 2014-09-10 NOTE — Plan of Care (Signed)
Problem: Phase I Progression Outcomes Goal: Sutures/staples intact Outcome: Not Applicable Date Met:  73/08/56 Open incision

## 2014-09-11 ENCOUNTER — Other Ambulatory Visit (HOSPITAL_COMMUNITY): Payer: 59

## 2014-09-11 LAB — MAGNESIUM: Magnesium: 1.9 mg/dL (ref 1.7–2.4)

## 2014-09-11 LAB — COMPREHENSIVE METABOLIC PANEL
ALT: 13 U/L — AB (ref 14–54)
AST: 19 U/L (ref 15–41)
Albumin: 2.1 g/dL — ABNORMAL LOW (ref 3.5–5.0)
Alkaline Phosphatase: 127 U/L — ABNORMAL HIGH (ref 38–126)
Anion gap: 8 (ref 5–15)
BILIRUBIN TOTAL: 0.3 mg/dL (ref 0.3–1.2)
BUN: 13 mg/dL (ref 6–20)
CO2: 26 mmol/L (ref 22–32)
Calcium: 8.2 mg/dL — ABNORMAL LOW (ref 8.9–10.3)
Chloride: 102 mmol/L (ref 101–111)
Creatinine, Ser: 0.41 mg/dL — ABNORMAL LOW (ref 0.44–1.00)
GFR calc Af Amer: >60 mL/min (ref >60)
Glucose, Bld: 115 mg/dL — ABNORMAL HIGH (ref 65–99)
POTASSIUM: 3.8 mmol/L (ref 3.5–5.1)
SODIUM: 136 mmol/L (ref 135–145)
Total Protein: 5.7 g/dL — ABNORMAL LOW (ref 6.5–8.1)

## 2014-09-11 LAB — CBC
HEMATOCRIT: 23.9 % — AB (ref 36.0–46.0)
HEMOGLOBIN: 7.5 g/dL — AB (ref 12.0–15.0)
MCH: 27.7 pg (ref 26.0–34.0)
MCHC: 31.4 g/dL (ref 30.0–36.0)
MCV: 88.2 fL (ref 78.0–100.0)
Platelets: 190 10*3/uL (ref 150–400)
RBC: 2.71 MIL/uL — AB (ref 3.87–5.11)
RDW: 17.3 % — AB (ref 11.5–15.5)
WBC: 7.8 10*3/uL (ref 4.0–10.5)

## 2014-09-11 LAB — GLUCOSE, CAPILLARY
Glucose-Capillary: 110 mg/dL — ABNORMAL HIGH (ref 65–99)
Glucose-Capillary: 112 mg/dL — ABNORMAL HIGH (ref 65–99)
Glucose-Capillary: 113 mg/dL — ABNORMAL HIGH (ref 65–99)
Glucose-Capillary: 118 mg/dL — ABNORMAL HIGH (ref 65–99)
Glucose-Capillary: 126 mg/dL — ABNORMAL HIGH (ref 65–99)

## 2014-09-11 LAB — DIFFERENTIAL
Basophils Absolute: 0.1 10*3/uL (ref 0.0–0.1)
Basophils Relative: 1 % (ref 0–1)
EOS ABS: 0.1 10*3/uL (ref 0.0–0.7)
Eosinophils Relative: 2 % (ref 0–5)
Lymphocytes Relative: 19 % (ref 12–46)
Lymphs Abs: 1.5 10*3/uL (ref 0.7–4.0)
Monocytes Absolute: 0.9 10*3/uL (ref 0.1–1.0)
Monocytes Relative: 11 % (ref 3–12)
NEUTROS ABS: 5.3 10*3/uL (ref 1.7–7.7)
NEUTROS PCT: 67 % (ref 43–77)

## 2014-09-11 LAB — PHOSPHORUS: Phosphorus: 4.2 mg/dL (ref 2.5–4.6)

## 2014-09-11 LAB — PREALBUMIN: Prealbumin: 9.8 mg/dL — ABNORMAL LOW (ref 18–38)

## 2014-09-11 LAB — TRIGLYCERIDES: Triglycerides: 116 mg/dL (ref <150)

## 2014-09-11 MED ORDER — IOHEXOL 300 MG/ML  SOLN: 25.0000 mL | INTRAMUSCULAR | Status: AC

## 2014-09-11 MED ORDER — FAT EMULSION 20 % IV EMUL
240.0000 mL | INTRAVENOUS | Status: AC
  Administered 2014-09-11: 240 mL via INTRAVENOUS
  Filled 2014-09-11: qty 250

## 2014-09-11 MED ORDER — TRACE MINERALS CR-CU-MN-SE-ZN 10-1000-500-60 MCG/ML IV SOLN
INTRAVENOUS | Status: AC
  Administered 2014-09-11: 17:00:00 via INTRAVENOUS
  Filled 2014-09-11: qty 1440

## 2014-09-11 NOTE — Progress Notes (Signed)
PARENTERAL NUTRITION CONSULT NOTE - Follow-up  Pharmacy Consult for TPN Indication: Persistent SBO  Allergies  Allergen Reactions  . Avelox [Moxifloxacin Hcl In Nacl] Hives  . Codeine Hives  . Dextromethorphan Hives  . Erythromycin Hives  . Penicillins Hives    No problem with Imipenem  . Suprep [Na Sulfate-K Sulfate-Mg Sulf] Nausea And Vomiting    Patient Measurements: Height: 5' 7"  (170.2 cm) Weight: 112 lb 14 oz (51.2 kg) IBW/kg (Calculated) : 61.6 Usual Weight: 50.4 kg  Vital Signs: Temp: 98.2 F (36.8 C) (06/13 0543) Temp Source: Oral (06/13 0543) BP: 128/56 mmHg (06/13 0543) Pulse Rate: 67 (06/13 0543) Intake/Output from previous day: 06/12 0701 - 06/13 0700 In: 3623.7 [I.V.:1822.5; IV Piggyback:100; TPN:1701.2] Out: 10 [Drains:10]  Labs:  Recent Labs  09/11/14 0400  WBC 7.8  HGB 7.5*  HCT 23.9*  PLT 190     Recent Labs  09/09/14 0540 09/10/14 0410 09/11/14 0400  NA 134* 138 136  K 3.8 3.8 3.8  CL 104 104 102  CO2 26 27 26   GLUCOSE 123* 124* 115*  BUN 14 13 13   CREATININE 0.32* 0.39* 0.41*  CALCIUM 7.8* 8.1* 8.2*  MG 1.9 1.8 1.9  PHOS  --   --  4.2  PROT  --   --  5.7*  ALBUMIN  --   --  2.1*  AST  --   --  19  ALT  --   --  13*  ALKPHOS  --   --  127*  BILITOT  --   --  0.3   Estimated Creatinine Clearance: 62.7 mL/min (by C-G formula based on Cr of 0.41).    Recent Labs  09/10/14 1753 09/11/14 09/11/14 0540  GLUCAP 118* 118* 113*   Medications, infusions:  . sodium chloride 75 mL/hr at 09/11/14 0539  . Marland KitchenTPN (CLINIMIX-E) Adult 60 mL/hr at 09/10/14 1818   And  . fat emulsion 240 mL (09/10/14 1817)   Insulin Requirements: Moderate SSI q6h: 2 units / 24 hours.  Current Nutrition: clear liquid diet, Resource Breeze supplement TID (pt refusing most times its due)  IVF: NS at 75 ml/hr  Central access: CVC triple lumen, port L chest TPN start date: 6/1  ASSESSMENT                                                                                                           HPI: 39yoF with PMH metastatic rectal cancer and rectal stent 06/2014, s/p 3 cycles chemo presents 5/27 with N/V x 1 day.  Found to have SBO, likely secondary to pelvic tumor on CT.  NG tube placed and later removed once SBO improved on Abd XR.  Started on CL diet, but persistent N/V prevent meaningful PO intake.  To start on TPN per pharmacy for nutritional supplementation until tolerating PO.  Significant events:  5/31: patient misunderstood Surgeon to imply that TPN was not going to start today and did not allow TPN to be initiated. 6/2: s/p exp laparotomy, SB resection, LAR, stent, colostomy, appendectomy, hysterectomy, salpingo-oophorectomy, transverse  colon resection, debulking of peritoneum. 6/6: clamp NG tube 6/7: NGT d/c'ed 6/9: started CLD 6/12: small amount of stool in ostomy, + flatus  Today:   Glucose (goal <150): No Hx DM.  cbgs <150  Electrolytes: Corrected Ca WNL  Renal: SCr low, stable  LFTs: AST WNL, ALT low, Alk Phos and Tbili WNL (6/9)  TGs: elevated at baseline at 226 (6/1), 74 (6/6), 116 (6/13)  Prealbumin: 13.2 (6/1), 6 (6/6), 9.8 (6/13)  NUTRITIONAL GOALS                                                                                             RD recs: Nutrition needs: 1600-1800 kcal, 70-80 grams protein.  Clinimix 5/20 @ 60 mL/hr + 20% IV fat emulsion at 10 ml/hr to provide 72 grams protein and 1746 kcal   PLAN                                                                                                                         At 1800 today:  Continue Clinimix E 5/20 IV at goal rate of 60 ml/hr.  Continue  20% IV fat emulsion at 10 ml/hr.  TPN to contain standard multivitamins and trace elements.  IVF per MD orders, NS at 75 ml/hr.    Continue CBGs and moderate SSI q6h.  TPN lab panels on Mondays & Thursdays.  Consider stopping scopolamine if no longer needed (added by surgery 6/2 for peri-op  N/V)  F/u ability to tolerate diet.   Sydney Morrison, PharmD, BCPS.   Pager: [phone] 09/11/2014 7:14 AM

## 2014-09-11 NOTE — Progress Notes (Signed)
[facility] PROGRESS NOTE  Sydney Morrison HGD:[id number] DOB: May 02, 1955 DOA: 08/25/2014 PCP: Delphina Cahill, MD  Assessment/Plan: Small bowel obstruction - Had initially failed conservative management.  - Surgery following and pt underwent exploratory laparotomy on 6/2 with lower anterior rectosigmoid resection with colostomy.  - Pt is s/p appendectomy, hysterectomy, and salpingoophorectomy, and debulking of peritoneum. - Remains on Dilaudid PCA for pain. On TNA.  - Continue ambulation as tolerated per Surgery - Beginning to have more liquid stool in colostomy bag  Sepsis secondary to possible PNA - Patient febrile recently with progressive leukocytosis.  - Repeat chest x-ray showing new right lung base opacity - UA unremarkable - Had continued on Primaxin. Was on IV Flagyl From 5/27-5/31. - ID since consulted. Discussed with ID with recommendations to d/c abx as of 6/8 - Cont to monitor and if sepsis worsens or if pt becomes febrile, ID recommends CT abd and to resume abx at that time - ID has since signed off - Remains afebrile  Anemia -Hemoglobin has been steady around 7 or the past few days-it was 9 when she was first admitted -she has received 3 units of packed blood cells since being admitted -Check anemia panel in a.m.  Rectal ca with liver mets -sees Dr Benay Spice. S/p 3 cycles of chemo.  -Dr. Benay Spice to follow-up as outpatient.  Chest pain and Aflutter with RVR -cardiology was consulted.  - 2D echo with normal EF - Cardiology has since signed off.  Hypokalemia/ hypophsphatemia/ hypomagnesemia - Cont to replace as needed - Cont to monitor  Thrombocytopenia -avoid heparin products. Monitor closely. -Platelets have normalized today  Stage II pressure ulcer - No signs of infection - Pink foam applied. - See WOC note. Recs to involve surgery regarding pressure sore - Surgery continues to follow  Tobacco abuse - Continue nicotine patch as  tolerated.  Code Status: Full Family Communication: Pt in room, family at bedside Disposition Plan: Pending  Consultants:  General Surgery  Cardiology  Infectious Disease  Procedures: 6/2: EXPLORATORY LAPAROTOMY SMALL BOWEL RESECTION LOW ANTERIOR RECTOSIGMOID RESECTION (including stent) with COLOSTOMY APPENDECTOMY HYSTERECTOMY SUPRACERVICAL ABDOMINAL Salpingo-oophorectomy TRANSVERSE COLON RESECTION WITH PARTIAL OMENTECTOMY DEBULKING OF PERITONEUM  Antibiotics:  Primaxin 5/27>>>6/8  HPI/Subjective: She is pleased that she has more stool output in her colostomy. No complaints.  Objective: Filed Vitals:   09/11/14 0543 09/11/14 0635 09/11/14 0752 09/11/14 1200  BP: 128/56     Pulse: 67     Temp: 98.2 F (36.8 C)     TempSrc: Oral     Resp: 18  19 18   Height:      Weight:  51.2 kg (112 lb 14 oz)    SpO2: 96%  97% 100%    Intake/Output Summary (Last 24 hours) at 09/11/14 1322 Last data filed at 09/11/14 0657  Gross per 24 hour  Intake 3523.67 ml  Output     10 ml  Net 3513.67 ml   Filed Weights   09/09/14 0530 09/10/14 0547 09/11/14 0635  Weight: 49.7 kg (109 lb 9.1 oz) 49.7 kg (109 lb 9.1 oz) 51.2 kg (112 lb 14 oz)    Exam:   General:  Awake, laying in bed, in nad, [place] in place  Cardiovascular: regular, s1, s2  Respiratory: normal resp effort, no wheezing  Abdomen: soft,nontender, decreased BS- colostomy with air and liquid stool  Musculoskeletal: perfused, no clubbing, no cyanosis  Data Reviewed: Basic Metabolic Panel:  Recent Labs Lab 09/07/14 0500 09/08/14 0515 09/09/14 0540 09/10/14 0410 09/11/14  0400  NA 133* 134* 134* 138 136  K 3.7 4.3 3.8 3.8 3.8  CL 101 101 104 104 102  CO2 26 25 26 27 26   GLUCOSE 144* 118* 123* 124* 115*  BUN 12 13 14 13 13   CREATININE 0.39* 0.42* 0.32* 0.39* 0.41*  CALCIUM 7.7* 8.0* 7.8* 8.1* 8.2*  MG 1.7 1.8 1.9 1.8 1.9  PHOS 3.6  --   --   --  4.2   Liver Function Tests:  Recent Labs Lab  09/07/14 0500 09/11/14 0400  AST 22 19  ALT 13* 13*  ALKPHOS 116 127*  BILITOT 0.3 0.3  PROT 4.6* 5.7*  ALBUMIN 1.7* 2.1*   No results for input(s): LIPASE, AMYLASE in the last 168 hours. No results for input(s): AMMONIA in the last 168 hours. CBC:  Recent Labs Lab 09/05/14 0400 09/06/14 0445 09/11/14 0400  WBC 22.3* 11.5* 7.8  NEUTROABS  --   --  5.3  HGB 6.8* 7.6* 7.5*  HCT 21.3* 23.5* 23.9*  MCV 86.2 86.1 88.2  PLT 71* 81* 190   Cardiac Enzymes: No results for input(s): CKTOTAL, CKMB, CKMBINDEX, TROPONINI in the last 168 hours. BNP (last 3 results) No results for input(s): BNP in the last 8760 hours.  ProBNP (last 3 results) No results for input(s): PROBNP in the last 8760 hours.  CBG:  Recent Labs Lab 09/10/14 1206 09/10/14 1753 09/11/14 09/11/14 0540 09/11/14 1145  GLUCAP 119* 118* 118* 113* 126*    Recent Results (from the past 240 hour(s))  Culture, blood (routine x 2)     Status: None   Collection Time: 09/04/14  7:58 AM  Result Value Ref Range Status   Specimen Description BLOOD LEFT ARM  Final   Special Requests BOTTLES DRAWN AEROBIC ONLY 3CC  Final   Culture   Final    NO GROWTH 5 DAYS Performed at [facility]    Report Status 09/10/2014 FINAL  Final  Culture, blood (routine x 2)     Status: None   Collection Time: 09/04/14  8:00 AM  Result Value Ref Range Status   Specimen Description RIGHT ANTECUBITAL  Final   Special Requests BOTTLES DRAWN AEROBIC AND ANAEROBIC 5CC  Final   Culture   Final    NO GROWTH 5 DAYS Performed at [facility]    Report Status 09/10/2014 FINAL  Final     Studies: No results found.  Scheduled Meds: . bisacodyl  10 mg Rectal Daily  . HYDROmorphone PCA 0.3 mg/mL   Intravenous 6 times per day  . insulin aspart  0-15 Units Subcutaneous 4 times per day  . nicotine  21 mg Transdermal Daily  . pantoprazole (PROTONIX) IV  40 mg Intravenous QHS  . scopolamine  1 patch Transdermal Q72H  . sodium  chloride  10-40 mL Intracatheter Q12H  . sodium chloride  3 mL Intravenous Q12H   Continuous Infusions: . sodium chloride 75 mL/hr at 09/11/14 0539  . Marland KitchenTPN (CLINIMIX-E) Adult 60 mL/hr at 09/10/14 1818   And  . fat emulsion 240 mL (09/10/14 1817)  . Marland KitchenTPN (CLINIMIX-E) Adult     And  . fat emulsion        Elis Sauber  [facility] Pager [phone] If 7PM-7AM, please contact night-coverage at [email or web address], password [facility] 09/11/2014, 1:22 PM  LOS: 17 days

## 2014-09-11 NOTE — Progress Notes (Signed)
Delayed entry from 6/12.  Patient ID: KRISA BLATTNER, female   DOB: January 25, 1956, 59 y.o.   MRN: [id number] 10 Days Post-Op  Subjective: Still not having flatus or BM.    Objective: Vital signs in last 24 hours: Temp:  [98 F (36.7 C)-98.8 F (37.1 C)] 98.2 F (36.8 C) (06/13 0543) Pulse Rate:  [67-81] 67 (06/13 0543) Resp:  [14-24] 19 (06/13 0752) BP: (125-144)/(55-65) 128/56 mmHg (06/13 0543) SpO2:  [96 %-100 %] 97 % (06/13 0752) Weight:  [51.2 kg (112 lb 14 oz)] 51.2 kg (112 lb 14 oz) (06/13 3557) Last BM Date:  (prior to surgery. )  No stool recorded Afebrile, VSS Labs OK  Intake/Output from previous day: 06/12 0701 - 06/13 0700 In: 3623.7 [I.V.:1822.5; IV Piggyback:100; TPN:1701.2] Out: 10 [Drains:10] Intake/Output this shift:    General appearance: alert, cooperative and no distress Resp: breathing comfortably GI: dressing c/d/i, ostomy is still edematous, sweat in bag, no gas to speak of.    Lab Results:   Recent Labs  09/11/14 0400  WBC 7.8  HGB 7.5*  HCT 23.9*  PLT 190    BMET  Recent Labs  09/10/14 0410 09/11/14 0400  NA 138 136  K 3.8 3.8  CL 104 102  CO2 27 26  GLUCOSE 124* 115*  BUN 13 13  CREATININE 0.39* 0.41*  CALCIUM 8.1* 8.2*   PT/INR No results for input(s): LABPROT, INR in the last 72 hours.   Recent Labs Lab 09/07/14 0500 09/11/14 0400  AST 22 19  ALT 13* 13*  ALKPHOS 116 127*  BILITOT 0.3 0.3  PROT 4.6* 5.7*  ALBUMIN 1.7* 2.1*     Lipase     Component Value Date/Time   LIPASE 30 08/24/2014 0510     Studies/Results: No results found.  Medications: . bisacodyl  10 mg Rectal Daily  . feeding supplement (RESOURCE BREEZE)  1 Container Oral TID BM  . HYDROmorphone PCA 0.3 mg/mL   Intravenous 6 times per day  . insulin aspart  0-15 Units Subcutaneous 4 times per day  . nicotine  21 mg Transdermal Daily  . pantoprazole (PROTONIX) IV  40 mg Intravenous QHS  . scopolamine  1 patch Transdermal Q72H  . sodium chloride   10-40 mL Intracatheter Q12H  . sodium chloride  3 mL Intravenous Q12H   . sodium chloride 75 mL/hr at 09/11/14 0539  . Marland KitchenTPN (CLINIMIX-E) Adult 60 mL/hr at 09/10/14 1818   And  . fat emulsion 240 mL (09/10/14 1817)    Assessment/Plan 1. Rectal cancer, Liver metastasis, rectal stent 06/2014 2. Small Bowel Obstruction, Rectosigmoid cancer with perforation, Pelvic carcinomatosis, Omental central caking/carcinoimatosis, Liver metatasis S/P EXPLORATORY LAPAROTOMY, SMALL BOWEL RESECTION LOW ANTERIOR RECTOSIGMOID RESECTION (including stent) with COLOSTOMY APPENDECTOMY, HYSTERECTOMY SUPRACERVICAL ABDOMINAL Salpingo-oophorectomy TRANSVERSE COLON RESECTION WITH PARTIAL OMENTECTOMY DEBULKING OF PERITONEUM 09/06/14, Dr. Johney Maine. 2B  Post op ileus 3. Sepsis 4. Anemia/thrombocytopenia 5. Severe protein calorie malnutrition  - TNA 6. Hx of tobacco use 7. Stage II pressure sores 8. Antibiotics: Day 13 Primaxin 9. DVT: SCD/ No heparin secondary to anemia thrombocytopenia    Plan:   Continue TNA Await return of bowel function. Continue PT/ambulation Suppository via ostomy Plan scan tomorrow.     LOS: 17 days    Isley Weisheit 09/10/2014

## 2014-09-11 NOTE — Progress Notes (Addendum)
PT Cancellation Note and D/C NOTE  Patient Details Name: Sydney Morrison MRN: [id number] DOB: 06-02-1955   Cancelled Treatment:     pt amb several times a day with spouse and feels she does not need Acute PT.  Will consult LPT to D/C from Acute PT services.    Nathanial Rancher 09/11/2014, 4:04 PM   Collaborated with PTA regarding pt progress; reviewed last treatment note and plan is for home with HHPT and intermittent assist/supervision from family; Will D/C PT at this time; Please re-order if need should arise; Thank you;   Kenyon Ana, PT Pager: [phone] 09/12/2014

## 2014-09-11 NOTE — Progress Notes (Signed)
Patient ID: ZYRAH WISWELL, female   DOB: 15-Jun-1955, 59 y.o.   MRN: [id number] 11 Days Post-Op  Subjective: Pt feels better today, except still having pain in her abdomen from surgery.  Starting pass flatus in her ostomy and put out a small amount of stool.  Tolerating clear liquids.  Hears her belly gurgling.    Objective: Vital signs in last 24 hours: Temp:  [98 F (36.7 C)-98.8 F (37.1 C)] 98.2 F (36.8 C) (06/13 0543) Pulse Rate:  [67-81] 67 (06/13 0543) Resp:  [14-24] 19 (06/13 0752) BP: (125-144)/(55-65) 128/56 mmHg (06/13 0543) SpO2:  [96 %-100 %] 97 % (06/13 0752) Weight:  [51.2 kg (112 lb 14 oz)] 51.2 kg (112 lb 14 oz) (06/13 7829) Last BM Date:  (prior to surgery. )  Intake/Output from previous day: 06/12 0701 - 06/13 0700 In: 3623.7 [I.V.:1822.5; IV Piggyback:100; TPN:1701.2] Out: 10 [Drains:10] Intake/Output this shift:    PE: Neck:  Has left IJ Abd: soft, appropriately tender, incisional wound is healing well and is packed, but clean, few BS, ostomy with air.  Minimal amount of output, some brown serous drainage mostly, maybe mixed with small amount of stool.  JP with serous output Heart: regular Lungs: CTAB  Lab Results:   Recent Labs  09/11/14 0400  WBC 7.8  HGB 7.5*  HCT 23.9*  PLT 190   BMET  Recent Labs  09/10/14 0410 09/11/14 0400  NA 138 136  K 3.8 3.8  CL 104 102  CO2 27 26  GLUCOSE 124* 115*  BUN 13 13  CREATININE 0.39* 0.41*  CALCIUM 8.1* 8.2*   PT/INR No results for input(s): LABPROT, INR in the last 72 hours. CMP     Component Value Date/Time   NA 136 09/11/2014 0400   NA 140 08/16/2014 1037   K 3.8 09/11/2014 0400   K 4.2 08/16/2014 1037   CL 102 09/11/2014 0400   CO2 26 09/11/2014 0400   CO2 28 08/16/2014 1037   GLUCOSE 115* 09/11/2014 0400   GLUCOSE 92 08/16/2014 1037   BUN 13 09/11/2014 0400   BUN 23.1 08/16/2014 1037   CREATININE 0.41* 09/11/2014 0400   CREATININE 0.7 08/16/2014 1037   CALCIUM 8.2* 09/11/2014 0400    CALCIUM 9.5 08/16/2014 1037   PROT 5.7* 09/11/2014 0400   PROT 7.5 08/16/2014 1037   ALBUMIN 2.1* 09/11/2014 0400   ALBUMIN 3.8 08/16/2014 1037   AST 19 09/11/2014 0400   AST 21 08/16/2014 1037   ALT 13* 09/11/2014 0400   ALT 15 08/16/2014 1037   ALKPHOS 127* 09/11/2014 0400   ALKPHOS 143 08/16/2014 1037   BILITOT 0.3 09/11/2014 0400   BILITOT 0.28 08/16/2014 1037   GFRNONAA >60 09/11/2014 0400   GFRAA >60 09/11/2014 0400   Lipase     Component Value Date/Time   LIPASE 30 08/24/2014 0510       Studies/Results: No results found.  Anti-infectives: Anti-infectives    Start     Dose/Rate Route Frequency Ordered Stop   09/01/14 1200  imipenem-cilastatin (PRIMAXIN) 250 mg in sodium chloride 0.9 % 100 mL IVPB  Status:  Discontinued     250 mg 200 mL/hr over 30 Minutes Intravenous 4 times per day 09/01/14 1051 09/06/14 1759   08/31/14 1030  clindamycin (CLEOCIN) 900 mg, gentamicin (GARAMYCIN) 240 mg in sodium chloride 0.9 % 1,000 mL for intraperitoneal lavage  Status:  Discontinued      Intraperitoneal To Surgery 08/31/14 1023 08/31/14 1513   08/31/14 0600  clindamycin (CLEOCIN) IVPB 900 mg     900 mg 100 mL/hr over 30 Minutes Intravenous 60 min pre-op 08/30/14 1218 08/31/14 0944   08/31/14 0600  [MAR Hold]  gentamicin (GARAMYCIN) 220 mg in dextrose 5 % 100 mL IVPB     (MAR Hold since 08/31/14 1010)   5 mg/kg  44.1 kg 105.5 mL/hr over 60 Minutes Intravenous 60 min pre-op 08/30/14 1218 08/31/14 1005   08/30/14 2100  imipenem-cilastatin (PRIMAXIN) 250 mg in sodium chloride 0.9 % 100 mL IVPB  Status:  Discontinued     250 mg 200 mL/hr over 30 Minutes Intravenous 3 times per day 08/30/14 1739 09/01/14 1051   08/25/14 1800  imipenem-cilastatin (PRIMAXIN) 250 mg in sodium chloride 0.9 % 100 mL IVPB  Status:  Discontinued     250 mg 200 mL/hr over 30 Minutes Intravenous Every 6 hours 08/25/14 1716 08/30/14 1739   08/25/14 1800  metroNIDAZOLE (FLAGYL) IVPB 500 mg  Status:   Discontinued     500 mg 100 mL/hr over 60 Minutes Intravenous Every 8 hours 08/25/14 1716 08/27/14 1003       Assessment/Plan POD 11 1. Rectal cancer, Liver metastasis, rectal stent 06/2014 2. Small Bowel Obstruction, Rectosigmoid cancer with perforation, Pelvic carcinomatosis, Omental central caking/carcinoimatosis, Liver metatasis  S/P EXPLORATORY LAPAROTOMY, SMALL BOWEL RESECTION LOW ANTERIOR RECTOSIGMOID RESECTION (including stent) with COLOSTOMY, APPENDECTOMY, HYSTERECTOMY SUPRACERVICAL ABDOMINAL Salpingo-oophorectomy, TRANSVERSE COLON RESECTION WITH PARTIAL OMENTECTOMY,  DEBULKING OF PERITONEUM  - 09/06/14, Dr. Johney Maine.  2B Post op ileus  -cont TNA -patient tolerating clears -she has refused the CT scan today because she has started to pass some flatus and move some liquid into her bag.  She has no WBC and I think this is ok for now. -cont to mobilize and pulm toilet -suppository through her colostomy  3.  Sepsis, resolved 4. Anemia   Hgb - 7.5 - 09/11/2014  Thrombocytopenia -  resolved 5. Severe protein calorie malnutrition - TNA 6. Hx of tobacco use 7. Stage II pressure sores 8. Antibiotics: completed 14 days of primaxin 9. DVT: SCD/ No heparin secondary to anemia thrombocytopenia, however platelets back up to 190K.    LOS: 17 days    OSBORNE,KELLY E 09/11/2014, 10:09 AM Pager: [phone]  Agree with above. In overall good spirits.  Alphonsa Overall, MD, [facility] Surgery Pager: [phone] Office phone:  [phone]

## 2014-09-11 NOTE — Progress Notes (Signed)
Order for CT of ABD. Pt now putting out stool via colostomy and does not want to do CT. MD notified. Order to d/c CT given. Will continue to monitor.

## 2014-09-11 NOTE — Progress Notes (Signed)
Nutrition Follow-up  DOCUMENTATION CODES:  Severe malnutrition in context of chronic illness, Underweight  INTERVENTION: - Continue TPN per pharmacy - Advance to FLD as soon as medically feasible - Will d/c Resource Breeze per pt request - RD will continue to monitor for needs  NUTRITION DIAGNOSIS:  Malnutrition related to chronic illness as evidenced by percent weight loss, severe depletion of body fat, severe depletion of muscle mass. -ongoing  GOAL:  Patient will meet greater than or equal to 90% of their needs -met with TPN  MONITOR:  Other (Comment), PO intake, Weight trends, Labs, I & O's (TPN)  ASSESSMENT: 59 year old female with past medical history significant for rectal cancer with liver metastasis, rectal stent placement in 06/2014 (under Dr. Gearldine Shown care), status post 3 cycles of chemotherapy, last cycle completed 08/16/2014. She started to develop nausea and vomiting over past 24 hours prior to this admission.  6/13: - Pt continues with Clinimix E 5/20 @ 60 mL/hr with 20% lipids @ 10 mL/hr which provides 1746 kcal, 72 grams protein - Pt reports that she does not like [employer] and requests it be d/c'ed. She does like Ensure and would like this ordered with diet advancement. - She indicates she has been having nausea that is not exacerbated by intakes and that she feels is associated with pain - Pt states surgery PA visited her this AM and stated plan to remain on CLD; pt interested in diet advancement as she does not like items provided on CLD. - Pt started to have output into ostomy 6/12; noted Reglan order in place - Meeting needs with TPN - Labs reviewed; CBGs: 105 mg/dL, creatinine low, Ca: 8.2 mg/dL.    5/29: - Pt followed by [place], last seen 5/18. - Pt NPO for bowel rest d/t SBO. Pt states that she has not had anything by mouth x 5 days.  - Pt with NGT for suction, output 1000 ml. Recommend nutrition support if bowel rest to last > 7  days.  - Pt at risk for refeeding risk d/t severe malnutrition and poor PO intake PTA. - Pt with 22 lb weight loss since 3/02 (18% weight loss x 3 months, significant for time frame).  6/2 pt s/p PROCEDURE: Procedure(s): EXPLORATORY LAPAROTOMY SMALL BOWEL RESECTION LOW ANTERIOR RECTOSIGMOID RESECTION (including stent) with COLOSTOMY APPENDECTOMY HYSTERECTOMY SUPRACERVICAL ABDOMINAL Salpingo-oophorectomy TRANSVERSE COLON RESECTION WITH PARTIAL OMENTECTOMY DEBULKING OF PERITONEUM  6/3: - Moved to SDU post-op. - Pt tolerating TPN. Currently running at goal rate of 60 mL/hr providing 1267 kcal (79% of needs) and 72 g protein (100% of needs.)   6/10:  - NGT out since last assessment - Diet advanced to CLD 09/07/14 and pt reports she has been taking small sips of gingerale and does not like most of the items on the tray - Pt indicates nausea with intakes, no emesis - Continues with Clinimix E 5/20 @ 60 mL/hr with 20% lipids @ 10 mL/hr which provides 1746 kcal, 72 grams protein - Will order Resource Breeze TID to supplement - Pt will need diet education prior to d/c, will provide as appropriate with diet advancement  Height:  Ht Readings from Last 1 Encounters:  08/25/14 _0  (1.702 m)    Weight:  Wt Readings from Last 1 Encounters:  09/11/14 112 lb 14 oz (51.2 kg)    Ideal Body Weight:  61.3 kg  Wt Readings from Last 10 Encounters:  09/11/14 112 lb 14 oz (51.2 kg)  08/25/14 110 lb (  49.896 kg)  08/24/14 109 lb 12.8 oz (49.805 kg)  08/24/14 110 lb (49.896 kg)  08/16/14 110 lb 11.2 oz (50.213 kg)  08/02/14 113 lb (51.256 kg)  07/23/14 114 lb 5 oz (51.852 kg)  07/14/14 118 lb 14.4 oz (53.933 kg)  06/29/14 120 lb (54.432 kg)  05/31/14 125 lb (56.7 kg)    BMI:  Body mass index is 17.67 kg/(m^2).  Estimated Nutritional Needs:  Kcal:  1600-1800  Protein:  70-80g  Fluid:  1.6L/day  Skin:  Wound (see comment) (Stage II sacral ulcer)  Diet Order:  Diet clear liquid  Room service appropriate?: Yes; Fluid consistency:: Thin TPN (CLINIMIX-E) Adult TPN (CLINIMIX-E) Adult  EDUCATION NEEDS:  No education needs identified at this time   Intake/Output Summary (Last 24 hours) at 09/11/14 1052 Last data filed at 09/11/14 0657  Gross per 24 hour  Intake 3523.67 ml  Output     10 ml  Net 3513.67 ml    Last BM:  6/12   Jarome Matin, RD, LDN Inpatient Clinical Dietitian Pager # [phone] After hours/weekend pager # [phone]

## 2014-09-12 LAB — IRON AND TIBC
IRON: 14 ug/dL — AB (ref 28–170)
Saturation Ratios: 6 % — ABNORMAL LOW (ref 10.4–31.8)
TIBC: 223 ug/dL — ABNORMAL LOW (ref 250–450)
UIBC: 209 ug/dL

## 2014-09-12 LAB — GLUCOSE, CAPILLARY
GLUCOSE-CAPILLARY: 138 mg/dL — AB (ref 65–99)
Glucose-Capillary: 115 mg/dL — ABNORMAL HIGH (ref 65–99)
Glucose-Capillary: 113 mg/dL — ABNORMAL HIGH (ref 65–99)

## 2014-09-12 LAB — RETICULOCYTES
RBC.: 2.71 MIL/uL — ABNORMAL LOW (ref 3.87–5.11)
RETIC CT PCT: 2.4 % (ref 0.4–3.1)
Retic Count, Absolute: 65 10*3/uL (ref 19.0–186.0)

## 2014-09-12 LAB — FERRITIN: Ferritin: 154 ng/mL (ref 11–307)

## 2014-09-12 LAB — VITAMIN B12: Vitamin B-12: 1028 pg/mL — ABNORMAL HIGH (ref 180–914)

## 2014-09-12 LAB — FOLATE: Folate: 15.6 ng/mL (ref >5.9)

## 2014-09-12 MED ORDER — TRACE MINERALS CR-CU-MN-SE-ZN 10-1000-500-60 MCG/ML IV SOLN
INTRAVENOUS | Status: AC
  Administered 2014-09-12: 18:00:00 via INTRAVENOUS
  Filled 2014-09-12: qty 1440

## 2014-09-12 MED ORDER — OXYCODONE-ACETAMINOPHEN 5-325 MG PO TABS: 1.0000 | ORAL_TABLET | ORAL | Status: DC | PRN

## 2014-09-12 MED ORDER — ENSURE ENLIVE PO LIQD
237.0000 mL | Freq: Two times a day (BID) | ORAL | Status: DC
  Administered 2014-09-12 – 2014-09-18 (×2): 237 mL via ORAL

## 2014-09-12 MED ORDER — HYDROMORPHONE HCL 1 MG/ML IJ SOLN
0.0500 mg | INTRAMUSCULAR | Status: DC | PRN
  Filled 2014-09-12: qty 1

## 2014-09-12 MED ORDER — DIPHENHYDRAMINE HCL 25 MG PO CAPS: 25.0000 mg | ORAL_CAPSULE | ORAL | Status: DC | PRN

## 2014-09-12 MED ORDER — FAT EMULSION 20 % IV EMUL
240.0000 mL | INTRAVENOUS | Status: AC
  Administered 2014-09-12: 240 mL via INTRAVENOUS
  Filled 2014-09-12: qty 250

## 2014-09-12 MED ORDER — HYDROXYZINE HCL 25 MG PO TABS
25.0000 mg | ORAL_TABLET | Freq: Four times a day (QID) | ORAL | Status: DC | PRN
Start: 2014-09-12 — End: 2014-09-21
  Administered 2014-09-12: 25 mg via ORAL
  Filled 2014-09-12: qty 1

## 2014-09-12 MED ORDER — SODIUM CHLORIDE 0.9 % IV SOLN
510.0000 mg | Freq: Once | INTRAVENOUS | Status: AC
  Administered 2014-09-12: 510 mg via INTRAVENOUS
  Filled 2014-09-12: qty 17

## 2014-09-12 NOTE — Care Management Note (Signed)
Case Management Note  Patient Details  Name: Sydney Morrison MRN: [id number] Date of Birth: March 20, 1956  Subjective/Objective:  POD#11 SBO,p/o ileus,severe protein malnutrition.colostomy some flatus JP-serous,ivf,iv dilaudid,tna,advancing diet-fulls.[facility] following awaiting d/c order.                  Action/Plan:d/c plan home [facility].   Expected Discharge Date:   (unknown)               Expected Discharge Plan:  [place]  In-House Referral:     Discharge planning Services  CM Consult  Post Acute Care Choice:    Choice offered to:  Patient  DME Arranged:  N/A DME Agency:  NA, [place]:  PT [place] Agency:     Status of Service:  In process, will continue to follow  Medicare Important Message Given:    Date Medicare IM Given:    Medicare IM give by:    Date Additional Medicare IM Given:    Additional Medicare Important Message give by:     If discussed at [place] of Stay Meetings, dates discussed:  09/12/14  Additional Comments:  Dessa Phi, RN 09/12/2014, 11:59 AM

## 2014-09-12 NOTE — Progress Notes (Signed)
NUTRITION NOTE  Pt seen for full follow-up yesterday (6/13). New consult from surgeon for assessment indicating: "I am going to leave her on clears, but you can try some Ensure if she will take it. I am just trying to go slow with her big surgery and ileus. I don't think a couple cans of ensure will hurt her."  During discussion yesterday, pt had expressed interest in receiving Ensure. Will order Ensure Enlive BID and assess for tolerance, diet advancement moving forward, and possibility of increasing number of bottles of Ensure Enlive to TID.  Will continue to follow per protocol.    Jarome Matin, RD, LDN Inpatient Clinical Dietitian Pager # [phone] After hours/weekend pager # [phone]

## 2014-09-12 NOTE — Progress Notes (Signed)
12 Days Post-Op  Subjective: Making slow progress, she has some stool like liquid in the ostomy bag and allot of gas.  She is up walking halls independently now.  Still has PCA.  Open wound looks good.  Objective: Vital signs in last 24 hours: Temp:  [98.1 F (36.7 C)-99.2 F (37.3 C)] 98.1 F (36.7 C) (06/14 0555) Pulse Rate:  [62-73] 65 (06/14 0555) Resp:  [14-24] 14 (06/14 0746) BP: (109-137)/(45-66) 136/66 mmHg (06/14 0555) SpO2:  [97 %-100 %] 99 % (06/14 0746) Weight:  [48.8 kg (107 lb 9.4 oz)] 48.8 kg (107 lb 9.4 oz) (06/14 0555) Last BM Date: 09/12/14 PO ?  Diet:   clears Drain 5 ml -  Clear fluid Tm 99.2 Labs:  Low Fe, TIBC, B12 up some Intake/Output from previous day: 06/13 0701 - 06/14 0700 In: 3469.1 [I.V.:1793.8; TPN:1675.3] Out: 5 [Drains:5] Intake/Output this shift:    General appearance: alert, cooperative and no distress Resp: clear to auscultation bilaterally GI: soft, sore, some bowel sounds, a little gas and stool like liquid in ostomy bag. SKIN:  left side of gluteal cleft, Measurement: 6.5cm x 5.5cm; dark soft eschar obscures wound bed.    Lab Results:   Recent Labs  09/11/14 0400  WBC 7.8  HGB 7.5*  HCT 23.9*  PLT 190    BMET  Recent Labs  09/10/14 0410 09/11/14 0400  NA 138 136  K 3.8 3.8  CL 104 102  CO2 27 26  GLUCOSE 124* 115*  BUN 13 13  CREATININE 0.39* 0.41*  CALCIUM 8.1* 8.2*   PT/INR No results for input(s): LABPROT, INR in the last 72 hours.   Recent Labs Lab 09/07/14 0500 09/11/14 0400  AST 22 19  ALT 13* 13*  ALKPHOS 116 127*  BILITOT 0.3 0.3  PROT 4.6* 5.7*  ALBUMIN 1.7* 2.1*     Lipase     Component Value Date/Time   LIPASE 30 08/24/2014 0510     Studies/Results: No results found.  Medications: . bisacodyl  10 mg Rectal Daily  . HYDROmorphone PCA 0.3 mg/mL   Intravenous 6 times per day  . insulin aspart  0-15 Units Subcutaneous 4 times per day  . nicotine  21 mg Transdermal Daily  .  pantoprazole (PROTONIX) IV  40 mg Intravenous QHS  . scopolamine  1 patch Transdermal Q72H  . sodium chloride  10-40 mL Intracatheter Q12H  . sodium chloride  3 mL Intravenous Q12H   . sodium chloride 75 mL/hr at 09/12/14 0613  . Marland KitchenTPN (CLINIMIX-E) Adult 60 mL/hr at 09/11/14 1714   And  . fat emulsion 10 kcal (09/11/14 1714)   Assessment/Plan 1. Rectal cancer, Liver metastasis, rectal stent 06/2014 2. Small Bowel Obstruction, Rectosigmoid cancer with perforation, Pelvic carcinomatosis, Omental central caking/carcinoimatosis, Liver metatasis S/P EXPLORATORY LAPAROTOMY, SMALL BOWEL RESECTION LOW ANTERIOR RECTOSIGMOID RESECTION (including stent) with COLOSTOMY, APPENDECTOMY, HYSTERECTOMY SUPRACERVICAL ABDOMINAL Salpingo-oophorectomy, TRANSVERSE COLON RESECTION WITH PARTIAL OMENTECTOMY, DEBULKING OF PERITONEUM - 09/06/14, Dr. Johney Maine. 2B Post op ileus  -cont TNA  -patient tolerating clears  3. Sepsis, resolved 4. Anemia  Hgb - 7.5 - 09/11/2014  5. Severe protein calorie malnutrition - TNA 6. Hx of tobacco use 7. Stage III pressure sores 8. Antibiotics: completed 14 days of primaxin none currently  9. DVT: SCD/ No heparin secondary to anemia thrombocytopenia, however platelets back up to 190K.   Plan:  I am going to leave her on clears for now, just not much coming thru the ostomy yet.  I  am stopping the PCA, she has IV dilaudid, and PO percocet for pain.  She is not taking Breeze because she does not like it.   She has a decubitus that has been developing since post op.  I have picture above.  She is going to loose some of the skin there, but hopefully with some care she will grow some good tissue below.  She is up walking independently, she is staying off of it, we are going to get her into the shower to clean this area.  Hopefully if we keep it clean and dry when she is not bathing it will heal without debridement .    We will follow it here in  hospital and can debride later if this changes.   LOS: 18 days   JENNINGS,WILLARD 09/12/2014  Agree with above. She is moved to a new bed - [place] - because of the shower facilities. Her ostomy has liquid stool, so this seems to be progressing.  Alphonsa Overall, MD, [facility] Pager: [phone] Office phone:  [phone]

## 2014-09-12 NOTE — Progress Notes (Addendum)
[facility] PROGRESS NOTE  Sydney Morrison RDE:[id number] DOB: 1955/07/28 DOA: 08/25/2014 PCP: Delphina Cahill, MD   Brief narrative 59 y/o female with hx of rectal ca with liver metastasis, rectal stent placement in 06/2014 (under Dr. Gearldine Shown care), status post 3 cycles of chemotherapy, last cycle completed 08/16/2014 admitted on 5/27 with small bowel obstruction and failed conservative management. taken to OR on 6/2 for exploratory laparotomy with lower anterior rectosigmoid resection with colostomy. Hospital course prolonged due to slow improvement. Since surgery, the patient has remained TPN dependent but has since tolerated clear liquid diet and continues to slowly advance as tolerated.  Assessment/Plan: Small bowel obstruction - Pt had initially failed conservative management.  - Surgery following and pt underwent exploratory laparotomy on 6/2 with lower anterior rectosigmoid resection with colostomy.  - Pt is s/p appendectomy, hysterectomy, and salpingoophorectomy, and debulking of peritoneum. - On TNA.  - Surgery recs to d/c dilaudid PCA and cont on percocoet - Continue ambulation as tolerated per Surgery - Pt noted to have more liquid stool in colostomy bag - Continue ambulation as tolerate  Sepsis secondary to possible PNA - Patient febrile recently with progressive leukocytosis.  - Repeat chest x-ray showing new right lung base opacity - UA unremarkable - Had continued on Primaxin. Was on IV Flagyl From 5/27-5/31. - ID since consulted. Discussed with ID with recommendations to d/c abx as of 6/8 - Cont to monitor and if sepsis worsens or if pt becomes febrile, ID recommends CT abd and to resume abx at that time - ID has since signed off - Remains afebrile  Anemia -Hemoglobin has been steady around 7 or the past few days-it was 9 when she was first admitted -she has received 3 units of packed blood cells since being admitted -Cont to follow hgb and tx as needed  Rectal  ca with liver mets -sees Dr Benay Spice. S/p 3 cycles of chemo.  -Dr. Benay Spice to follow-up as outpatient.  Chest pain and Aflutter with RVR -cardiology was consulted.  - 2D echo with normal EF - Cardiology has since signed off  Hypokalemia/ hypophsphatemia/ hypomagnesemia - Cont to replace as needed - Cont to monitor  Thrombocytopenia -avoid heparin products. Monitor closely. - Platelets have normalized today  Stage II pressure ulcer - Pink foam applied. - Discussed with General Surgery and WOC -Surgery with tentative plans for continued ambulation with hope that tissue will develop under loose skin  Tobacco abuse - Continue nicotine patch as tolerated.  Code Status: Full Family Communication: Pt in room Disposition Plan: Pending  Consultants:  General Surgery  Cardiology  Infectious Disease  WOC  Procedures: 6/2: EXPLORATORY LAPAROTOMY SMALL BOWEL RESECTION LOW ANTERIOR RECTOSIGMOID RESECTION (including stent) with COLOSTOMY APPENDECTOMY HYSTERECTOMY SUPRACERVICAL ABDOMINAL Salpingo-oophorectomy TRANSVERSE COLON RESECTION WITH PARTIAL OMENTECTOMY DEBULKING OF PERITONEUM  Antibiotics:  Primaxin 5/27>>>6/8  HPI/Subjective: Feels better today and is in good spirits  Objective: Filed Vitals:   09/12/14 0555 09/12/14 0746 09/12/14 1026 09/12/14 1406  BP: 136/66   139/66  Pulse: 65   69  Temp: 98.1 F (36.7 C)   98.5 F (36.9 C)  TempSrc: Oral   Oral  Resp: 20 14 14 14   Height:      Weight: 48.8 kg (107 lb 9.4 oz)     SpO2: 100% 99% 100% 99%    Intake/Output Summary (Last 24 hours) at 09/12/14 1726 Last data filed at 09/12/14 1500  Gross per 24 hour  Intake 3481.17 ml  Output  5 ml  Net 3476.17 ml   Filed Weights   09/10/14 0547 09/11/14 0635 09/12/14 0555  Weight: 49.7 kg (109 lb 9.1 oz) 51.2 kg (112 lb 14 oz) 48.8 kg (107 lb 9.4 oz)    Exam:   General:  Awake, ambulating in hallway, in nad, [place] in place  Cardiovascular: regular,  s1, s2  Respiratory: normal resp effort, no wheezing  Abdomen: soft,nontender, decreased BS- colostomy with air and liquid stool  Musculoskeletal: perfused, no clubbing, no cyanosis  Data Reviewed: Basic Metabolic Panel:  Recent Labs Lab 09/07/14 0500 09/08/14 0515 09/09/14 0540 09/10/14 0410 09/11/14 0400  NA 133* 134* 134* 138 136  K 3.7 4.3 3.8 3.8 3.8  CL 101 101 104 104 102  CO2 26 25 26 27 26   GLUCOSE 144* 118* 123* 124* 115*  BUN 12 13 14 13 13   CREATININE 0.39* 0.42* 0.32* 0.39* 0.41*  CALCIUM 7.7* 8.0* 7.8* 8.1* 8.2*  MG 1.7 1.8 1.9 1.8 1.9  PHOS 3.6  --   --   --  4.2   Liver Function Tests:  Recent Labs Lab 09/07/14 0500 09/11/14 0400  AST 22 19  ALT 13* 13*  ALKPHOS 116 127*  BILITOT 0.3 0.3  PROT 4.6* 5.7*  ALBUMIN 1.7* 2.1*   No results for input(s): LIPASE, AMYLASE in the last 168 hours. No results for input(s): AMMONIA in the last 168 hours. CBC:  Recent Labs Lab 09/06/14 0445 09/11/14 0400  WBC 11.5* 7.8  NEUTROABS  --  5.3  HGB 7.6* 7.5*  HCT 23.5* 23.9*  MCV 86.1 88.2  PLT 81* 190   Cardiac Enzymes: No results for input(s): CKTOTAL, CKMB, CKMBINDEX, TROPONINI in the last 168 hours. BNP (last 3 results) No results for input(s): BNP in the last 8760 hours.  ProBNP (last 3 results) No results for input(s): PROBNP in the last 8760 hours.  CBG:  Recent Labs Lab 09/11/14 1728 09/11/14 2353 09/12/14 0553 09/12/14 0746 09/12/14 1626  GLUCAP 112* 110* 115* 138* 113*    Recent Results (from the past 240 hour(s))  Culture, blood (routine x 2)     Status: None   Collection Time: 09/04/14  7:58 AM  Result Value Ref Range Status   Specimen Description BLOOD LEFT ARM  Final   Special Requests BOTTLES DRAWN AEROBIC ONLY 3CC  Final   Culture   Final    NO GROWTH 5 DAYS Performed at [facility]    Report Status 09/10/2014 FINAL  Final  Culture, blood (routine x 2)     Status: None   Collection Time: 09/04/14  8:00 AM   Result Value Ref Range Status   Specimen Description RIGHT ANTECUBITAL  Final   Special Requests BOTTLES DRAWN AEROBIC AND ANAEROBIC 5CC  Final   Culture   Final    NO GROWTH 5 DAYS Performed at [facility]    Report Status 09/10/2014 FINAL  Final     Studies: No results found.  Scheduled Meds: . bisacodyl  10 mg Rectal Daily  . feeding supplement (ENSURE ENLIVE)  237 mL Oral BID BM  . insulin aspart  0-15 Units Subcutaneous 4 times per day  . nicotine  21 mg Transdermal Daily  . pantoprazole (PROTONIX) IV  40 mg Intravenous QHS  . sodium chloride  10-40 mL Intracatheter Q12H  . sodium chloride  3 mL Intravenous Q12H   Continuous Infusions: . sodium chloride 75 mL/hr at 09/12/14 0613  . Marland KitchenTPN (CLINIMIX-E) Adult 60 mL/hr  at 09/11/14 1714   And  . fat emulsion 10 kcal (09/11/14 1714)  . Marland KitchenTPN (CLINIMIX-E) Adult     And  . fat emulsion        Honorio Devol K  [facility] Pager [phone] If 7PM-7AM, please contact night-coverage at [email or web address], password  [facility] 09/12/2014, 5:26 PM  LOS: 18 days

## 2014-09-12 NOTE — Progress Notes (Signed)
PARENTERAL NUTRITION CONSULT NOTE - Follow-up  Pharmacy Consult for TPN Indication: Persistent SBO  Allergies  Allergen Reactions  . Avelox [Moxifloxacin Hcl In Nacl] Hives  . Codeine Hives  . Dextromethorphan Hives  . Erythromycin Hives  . Penicillins Hives    No problem with Imipenem  . Suprep [Na Sulfate-K Sulfate-Mg Sulf] Nausea And Vomiting    Patient Measurements: Height: 5' 7"  (170.2 cm) Weight: 107 lb 9.4 oz (48.8 kg) IBW/kg (Calculated) : 61.6 Usual Weight: 50.4 kg  Vital Signs: Temp: 98.1 F (36.7 C) (06/14 0555) Temp Source: Oral (06/14 0555) BP: 136/66 mmHg (06/14 0555) Pulse Rate: 65 (06/14 0555) Intake/Output from previous day: 06/13 0701 - 06/14 0700 In: 3469.1 [I.V.:1793.8; TPN:1675.3] Out: 5 [Drains:5]  Labs:  Recent Labs  09/11/14 0400  WBC 7.8  HGB 7.5*  HCT 23.9*  PLT 190     Recent Labs  09/10/14 0410 09/11/14 0400  NA 138 136  K 3.8 3.8  CL 104 102  CO2 27 26  GLUCOSE 124* 115*  BUN 13 13  CREATININE 0.39* 0.41*  CALCIUM 8.1* 8.2*  MG 1.8 1.9  PHOS  --  4.2  PROT  --  5.7*  ALBUMIN  --  2.1*  AST  --  19  ALT  --  13*  ALKPHOS  --  127*  BILITOT  --  0.3  PREALBUMIN  --  9.8*  TRIG  --  116   Estimated Creatinine Clearance: 59.8 mL/min (by C-G formula based on Cr of 0.41).    Recent Labs  09/11/14 1728 09/11/14 2353 09/12/14 0553  GLUCAP 112* 110* 115*   Medications, infusions:  . sodium chloride 75 mL/hr at 09/12/14 0613  . Marland KitchenTPN (CLINIMIX-E) Adult 60 mL/hr at 09/11/14 1714   And  . fat emulsion 10 kcal (09/11/14 1714)   Insulin Requirements: Moderate SSI q6h: 0 units / 24 hours.  Current Nutrition: clear liquid diet  IVF: NS at 75 ml/hr  Central access: CVC triple lumen, port L chest TPN start date: 6/1  ASSESSMENT                                                                                                          HPI: 12yoF with PMH metastatic rectal cancer and rectal stent 06/2014, s/p 3 cycles  chemo presents 5/27 with N/V x 1 day.  Found to have SBO, likely secondary to pelvic tumor on CT.  NG tube placed and later removed once SBO improved on Abd XR.  Started on CL diet, but persistent N/V prevent meaningful PO intake.  To start on TPN per pharmacy for nutritional supplementation until tolerating PO.  Significant events:  5/31: patient misunderstood Surgeon to imply that TPN was not going to start today and did not allow TPN to be initiated. 6/2: s/p exp laparotomy, SB resection, LAR, stent, colostomy, appendectomy, hysterectomy, salpingo-oophorectomy, transverse colon resection, debulking of peritoneum. 6/6: clamp NG tube 6/7: NGT d/c'ed 6/9: started CLD 6/12: small amount of stool in ostomy, + flatus  Today:   Glucose (goal <150):  No Hx DM.  cbgs <150  Electrolytes: No labs 6/14. Corrected Ca WNL  Renal: SCr low, stable  LFTs: AST WNL, ALT low, Alk Phos and Tbili WNL (6/9)  TGs: elevated at baseline at 226 (6/1), 74 (6/6), 116 (6/13)  Prealbumin: 13.2 (6/1), 6 (6/6), 9.8 (6/13)  NUTRITIONAL GOALS                                                                                             RD recs: Nutrition needs: 1600-1800 kcal, 70-80 grams protein.  Clinimix 5/20 @ 60 mL/hr + 20% IV fat emulsion at 10 ml/hr to provide 72 grams protein and 1746 kcal   PLAN                                                                                                                         At 1800 today:  Continue Clinimix E 5/20 IV at goal rate of 60 ml/hr.  No plans to advance diet today as output minimal  Prealbumin improved  Continue  20% IV fat emulsion at 10 ml/hr.  TPN to contain standard multivitamins and trace elements.  IVF per MD orders, NS at 75 ml/hr.    Continue CBGs and moderate SSI q6h.  TPN lab panels on Mondays & Thursdays.  F/u ability to advance and ability to tolerate diet.   Doreene Eland, PharmD, BCPS.   Pager: [phone] 09/12/2014 7:13  AM

## 2014-09-13 ENCOUNTER — Ambulatory Visit: Payer: 59 | Admitting: Nurse Practitioner

## 2014-09-13 ENCOUNTER — Other Ambulatory Visit: Payer: 59

## 2014-09-13 ENCOUNTER — Ambulatory Visit: Payer: 59

## 2014-09-13 DIAGNOSIS — J189 Pneumonia, unspecified organism: Secondary | ICD-10-CM

## 2014-09-13 LAB — GLUCOSE, CAPILLARY
GLUCOSE-CAPILLARY: 81 mg/dL (ref 65–99)
GLUCOSE-CAPILLARY: 106 mg/dL — AB (ref 65–99)
Glucose-Capillary: 113 mg/dL — ABNORMAL HIGH (ref 65–99)
Glucose-Capillary: 114 mg/dL — ABNORMAL HIGH (ref 65–99)

## 2014-09-13 MED ORDER — TRACE MINERALS CR-CU-MN-SE-ZN 10-1000-500-60 MCG/ML IV SOLN
INTRAVENOUS | Status: AC
  Administered 2014-09-13: 18:00:00 via INTRAVENOUS
  Filled 2014-09-13: qty 1440

## 2014-09-13 MED ORDER — FAT EMULSION 20 % IV EMUL
240.0000 mL | INTRAVENOUS | Status: AC
  Administered 2014-09-13: 240 mL via INTRAVENOUS
  Filled 2014-09-13: qty 250

## 2014-09-13 NOTE — Progress Notes (Addendum)
PARENTERAL NUTRITION CONSULT NOTE - Follow-up  Pharmacy Consult for TPN Indication: Persistent SBO  Allergies  Allergen Reactions  . Avelox [Moxifloxacin Hcl In Nacl] Hives  . Codeine Hives  . Dextromethorphan Hives  . Erythromycin Hives  . Penicillins Hives    No problem with Imipenem  . Suprep [Na Sulfate-K Sulfate-Mg Sulf] Nausea And Vomiting    Patient Measurements: Height: 5' 7"  (170.2 cm) Weight: 104 lb 11.2 oz (47.492 kg) IBW/kg (Calculated) : 61.6 Usual Weight: 50.4 kg  Vital Signs: Temp: 98.2 F (36.8 C) (06/15 0635) Temp Source: Oral (06/15 0635) BP: 128/63 mmHg (06/15 0635) Pulse Rate: 69 (06/15 0635) Intake/Output from previous day: 06/14 0701 - 06/15 0700 In: 2885.5 [I.V.:1755; TPN:1130.5] Out: 110 [Drains:10; Stool:100]  Labs:  Recent Labs  09/11/14 0400  WBC 7.8  HGB 7.5*  HCT 23.9*  PLT 190     Recent Labs  09/11/14 0400  NA 136  K 3.8  CL 102  CO2 26  GLUCOSE 115*  BUN 13  CREATININE 0.41*  CALCIUM 8.2*  MG 1.9  PHOS 4.2  PROT 5.7*  ALBUMIN 2.1*  AST 19  ALT 13*  ALKPHOS 127*  BILITOT 0.3  PREALBUMIN 9.8*  TRIG 116   Estimated Creatinine Clearance: 58.2 mL/min (by C-G formula based on Cr of 0.41).    Recent Labs  09/12/14 1626 09/13/14 0001 09/13/14 0622  GLUCAP 113* 114* 81   Medications, infusions:  . sodium chloride 75 mL/hr at 09/12/14 1751  . Marland KitchenTPN (CLINIMIX-E) Adult 60 mL/hr at 09/12/14 1754   And  . fat emulsion 240 mL (09/12/14 1754)   Insulin Requirements: Moderate SSI q6h: 0 units / 24 hours.  Current Nutrition: clear liquid diet w/ Ensure ordered BID  IVF: NS at 75 ml/hr  Central access: CVC triple lumen, port L chest TPN start date: 6/1  ASSESSMENT                                                                                                          HPI: 67yoF with PMH metastatic rectal cancer and rectal stent 06/2014, s/p 3 cycles chemo presents 5/27 with N/V x 1 day.  Found to have SBO, likely  secondary to pelvic tumor on CT.  NG tube placed and later removed once SBO improved on Abd XR.  Started on CL diet, but persistent N/V prevent meaningful PO intake.  To start on TPN per pharmacy for nutritional supplementation until tolerating PO.  Significant events:  5/31: patient misunderstood Surgeon to imply that TPN was not going to start today and did not allow TPN to be initiated. 6/2: s/p exp laparotomy, SB resection, LAR, stent, colostomy, appendectomy, hysterectomy, salpingo-oophorectomy, transverse colon resection, debulking of peritoneum. 6/6: clamp NG tube 6/7: NGT d/c'ed 6/9: started CLD 6/12: small amount of stool in ostomy, + flatus 6/14: low iron - feraheme 540m given 6/15 Nausea last evening following Ensure  Today:   Glucose (goal <150): No Hx DM.  cbgs <150  Electrolytes: No labs 6/14. Corrected Ca WNL  Renal: weight trending down last  3 day. SCr low, stable  LFTs: AST WNL, ALT low, Alk Phos and Tbili WNL (6/9)  TGs: elevated at baseline at 226 (6/1), 74 (6/6), 116 (6/13)  Prealbumin: 13.2 (6/1), 6 (6/6), 9.8 (6/13)  NUTRITIONAL GOALS                                                                                             RD recs: Nutrition needs: 1600-1800 kcal, 70-80 grams protein.  Clinimix 5/20 @ 60 mL/hr + 20% IV fat emulsion at 10 ml/hr to provide 72 grams protein and 1746 kcal   PLAN                                                                                                                         At 1800 today:  Continue Clinimix E 5/20 IV at goal rate of 60 ml/hr.  Prealbumin improved  Continue  20% IV fat emulsion at 10 ml/hr.  TPN to contain standard multivitamins and trace elements.  IVF per MD orders, NS at 75 ml/hr.    Continue CBGs and moderate SSI q6h.  TPN lab panels on Mondays & Thursdays.  F/u ability to advance diet, then ability to tolerate  Doreene Eland, PharmD, BCPS.   Pager: [phone] 09/13/2014 7:35  AM

## 2014-09-13 NOTE — Progress Notes (Signed)
Patient ID: Sydney Morrison, female   DOB: 09/14/1955, 59 y.o.   MRN: [id number] 13 Days Post-Op  Subjective: Pt drank Ensure yesterday and started to dry heave after drinking it.  zofran didn't work.  She had to take phenergan to relieve her nausea.  Feels better this morning, but still some low grade nausea.    Objective: Vital signs in last 24 hours: Temp:  [98.2 F (36.8 C)-98.5 F (36.9 C)] 98.2 F (36.8 C) (06/15 0635) Pulse Rate:  [69-71] 69 (06/15 0635) Resp:  [14-16] 16 (06/15 0635) BP: (127-139)/(61-66) 128/63 mmHg (06/15 0635) SpO2:  [98 %-100 %] 99 % (06/15 0635) Weight:  [47.492 kg (104 lb 11.2 oz)] 47.492 kg (104 lb 11.2 oz) (06/15 0635) Last BM Date: 09/12/14  Intake/Output from previous day: 06/14 0701 - 06/15 0700 In: 2885.5 [I.V.:1755; TPN:1130.5] Out: 110 [Drains:10; Stool:100] Intake/Output this shift:    PE: Abd: soft, some Bs, ostomy with feculent output and air, stoma is pink and viable, midline wound is clean  Lab Results:   Recent Labs  09/11/14 0400  WBC 7.8  HGB 7.5*  HCT 23.9*  PLT 190   BMET  Recent Labs  09/11/14 0400  NA 136  K 3.8  CL 102  CO2 26  GLUCOSE 115*  BUN 13  CREATININE 0.41*  CALCIUM 8.2*   PT/INR No results for input(s): LABPROT, INR in the last 72 hours. CMP     Component Value Date/Time   NA 136 09/11/2014 0400   NA 140 08/16/2014 1037   K 3.8 09/11/2014 0400   K 4.2 08/16/2014 1037   CL 102 09/11/2014 0400   CO2 26 09/11/2014 0400   CO2 28 08/16/2014 1037   GLUCOSE 115* 09/11/2014 0400   GLUCOSE 92 08/16/2014 1037   BUN 13 09/11/2014 0400   BUN 23.1 08/16/2014 1037   CREATININE 0.41* 09/11/2014 0400   CREATININE 0.7 08/16/2014 1037   CALCIUM 8.2* 09/11/2014 0400   CALCIUM 9.5 08/16/2014 1037   PROT 5.7* 09/11/2014 0400   PROT 7.5 08/16/2014 1037   ALBUMIN 2.1* 09/11/2014 0400   ALBUMIN 3.8 08/16/2014 1037   AST 19 09/11/2014 0400   AST 21 08/16/2014 1037   ALT 13* 09/11/2014 0400   ALT 15  08/16/2014 1037   ALKPHOS 127* 09/11/2014 0400   ALKPHOS 143 08/16/2014 1037   BILITOT 0.3 09/11/2014 0400   BILITOT 0.28 08/16/2014 1037   GFRNONAA >60 09/11/2014 0400   GFRAA >60 09/11/2014 0400   Lipase     Component Value Date/Time   LIPASE 30 08/24/2014 0510       Studies/Results: No results found.  Anti-infectives: Anti-infectives    Start     Dose/Rate Route Frequency Ordered Stop   09/01/14 1200  imipenem-cilastatin (PRIMAXIN) 250 mg in sodium chloride 0.9 % 100 mL IVPB  Status:  Discontinued     250 mg 200 mL/hr over 30 Minutes Intravenous 4 times per day 09/01/14 1051 09/06/14 1759   08/31/14 1030  clindamycin (CLEOCIN) 900 mg, gentamicin (GARAMYCIN) 240 mg in sodium chloride 0.9 % 1,000 mL for intraperitoneal lavage  Status:  Discontinued      Intraperitoneal To Surgery 08/31/14 1023 08/31/14 1513   08/31/14 0600  clindamycin (CLEOCIN) IVPB 900 mg     900 mg 100 mL/hr over 30 Minutes Intravenous 60 min pre-op 08/30/14 1218 08/31/14 0944   08/31/14 0600  [MAR Hold]  gentamicin (GARAMYCIN) 220 mg in dextrose 5 % 100 mL IVPB     (  MAR Hold since 08/31/14 1010)   5 mg/kg  44.1 kg 105.5 mL/hr over 60 Minutes Intravenous 60 min pre-op 08/30/14 1218 08/31/14 1005   08/30/14 2100  imipenem-cilastatin (PRIMAXIN) 250 mg in sodium chloride 0.9 % 100 mL IVPB  Status:  Discontinued     250 mg 200 mL/hr over 30 Minutes Intravenous 3 times per day 08/30/14 1739 09/01/14 1051   08/25/14 1800  imipenem-cilastatin (PRIMAXIN) 250 mg in sodium chloride 0.9 % 100 mL IVPB  Status:  Discontinued     250 mg 200 mL/hr over 30 Minutes Intravenous Every 6 hours 08/25/14 1716 08/30/14 1739   08/25/14 1800  metroNIDAZOLE (FLAGYL) IVPB 500 mg  Status:  Discontinued     500 mg 100 mL/hr over 60 Minutes Intravenous Every 8 hours 08/25/14 1716 08/27/14 1003       Assessment/Plan  POD 13 1. Rectal cancer, Liver metastasis, rectal stent 06/2014 2. Small Bowel Obstruction, Rectosigmoid  cancer with perforation, Pelvic carcinomatosis, Omental central caking/carcinoimatosis, Liver metatasis S/P EXPLORATORY LAPAROTOMY, SMALL BOWEL RESECTION LOW ANTERIOR RECTOSIGMOID RESECTION (including stent) with COLOSTOMY, APPENDECTOMY, HYSTERECTOMY SUPRACERVICAL ABDOMINAL Salpingo-oophorectomy, TRANSVERSE COLON RESECTION WITH PARTIAL OMENTECTOMY, DEBULKING OF PERITONEUM - 09/06/14, Dr. Johney Maine.  2B Post op ileus -cont TNA -patient tolerating clears, but did not tolerate her Ensure last night.  Will continue on clears today, if she tolerates this again, will try full liquids tomorrow. -cont to mobilize and pulm toilet  3. Sepsis, resolved 4. Anemia  Hgb - 7.5 - 09/11/2014 (no labs today 6/15) Thrombocytopenia - resolved 5. Severe protein calorie malnutrition - TNA 6. Hx of tobacco use 7. Stage II pressure sores 8. Antibiotics: completed 14 days of primaxin 9. DVT prophylaxis: SCD/ No heparin secondary to anemia thrombocytopenia   LOS: 19 days    OSBORNE,KELLY E 09/13/2014, 9:48 AM Pager: [phone]  Agree with above. Husband in room. Fairly nauseated since last night.  Has backed off diet.  Will continue TPN. Still has stool in ostomy bag. Talked about decubitus ulcer.  Alphonsa Overall, MD, [facility] Surgery Pager: [phone] Office phone:  [phone]

## 2014-09-13 NOTE — Progress Notes (Signed)
Patient ID: Sydney Morrison, female   DOB: [birth year], 59 y.o.   MRN: [id number] [facility] PROGRESS NOTE  ELAYSHA BEVARD SNK:[id number] DOB: [birth year] DOA: 08/25/2014 PCP: Delphina Cahill, MD  Brief narrative:    59 year old female with past medical history of rectal cancer with liver metastasis, rectal stent placement in 06/2014, on chemotherapy (last cycle completed 08/16/2014) under Dr. Gearldine Shown care. She presented to [facility] ED 08/25/2014 with ongoing nausea, vomiting and abdominal pain. She was found to have small bowel obstruction  And has failed conservative management. She underwent exploratory laparotomy on 08/31/2014 with lower anterior rectosigmoid resection with colostomy. She is also s/p appendectomy, hysterectomy, and salpingoophorectomy, and debulking of peritoneum. Postoperatively she developed ileus. She is currently on nutritional support with TPN. Hospital course is complicated with development of sepsis due to possible pneumonia. She was on flagyl, Primaxin. All antibiotics stopped 09/06/2014.  Barrier to discharge: clears today and full liquid tomorrow is she feels better. Anticipate D/C in next 2-3 days.   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 - Pt is status post exploratory laparotomy on 08/31/2014 with lower anterior rectosigmoid resection with colostomy. She is also s/p appendectomy, hysterectomy, and salpingoophorectomy, and debulking of peritoneum by Dr. Michael Boston - Post operatively she developed ileus - She is currently on TPN for nutritional support - Diet is clear liquid with anticipation to advance to full liquid diet tomorrow if tolerated clears today   Active Problems: Sepsis secondary to possible  PNA - CXR on 09/04/2014 showed right lung base opacity concerning for pneumonia - As indicated in anti-infective section, pt was on primaxin and flagyl - All abx stopped at this point per ID recommendations - No fevers, normal WBC count today   Anemia of chronic disease  - Secondary to chronic illness, malignancy - So far 3 units of PRBC blood transfused - Follow up CBC in am  Rectal ca with liver mets - Last chemotherapy back in 07/2014 - Outpt follow up with Dr. Benay Spice   Chest pain and Aflutter with RVR - 2 D ECHO with preserved EF - Cardiology has seen the pt in consultation; last note 6/7/206 - A flutter occurred in the setting of multiple medical issues including hypokalemia, resolving SBO, metastatic rectal cancer. Max rate >200. Symptoms resolved after emesis resolved. No further episodes on telemetry post op - Per cardio, if recurrent sustained episodes, can treat with IV metoprolol for rate control.  Thrombocytopenia - Due to history of malignancy - Platelets WNL now  Stage II pressure ulcer - Appreciate WOC assessment   Tobacco abuse - Continue nicotine patch as tolerated.  Severe protein calorie malnutrition - In the context of chronic illness, malignancy - Nutrition consulted - On TPN for nutritional support     DVT Prophylaxis  - SCD's bilaterally due to thrombocytopenia    Code Status: Full.  Family Communication:  plan of care discussed with the patient Disposition Plan: Home once her nutrition improves. Anticipate in next 2-3 days.   IV access:  Peripheral IV  Procedures and diagnostic studies:    08/31/14: EXPLORATORY LAPAROTOMY SMALL BOWEL RESECTION LOW ANTERIOR RECTOSIGMOID RESECTION (including stent) with COLOSTOMY; APPENDECTOMY, HYSTERECTOMY, SUPRACERVICAL ABDOMINAL Salpingo-oophorectomy, TRANSVERSE COLON RESECTION WITH PARTIAL OMENTECTOMY DEBULKING OF PERITONEUM  Dg Chest  Port 1 View 09/04/2014  1. New right lung base opacity.   Electronically  Signed   By: Kerby Moors M.D.   On: 09/04/2014 15:16   Dg Chest Port 1 View 09/02/2014   No active disease.   Electronically Signed   By: Earle Gell M.D.   On: 09/02/2014 17:31   Dg Chest Port 1 View 08/31/2014   1. Left IJ line noted ending about the distal SVC. 2. Minimal bibasilar atelectasis noted.  Lungs otherwise clear.     Dg Abd Acute W/chest 08/30/2014   Continued mild prominence of mid abdominal small bowel loops with scattered air-fluid levels. Large stool burden throughout the colon. No real change since prior study.   Electronically Signed   By: Rolm Baptise M.D.   On: 08/30/2014 10:34   Dg Abd 2 Views 08/29/2014  Further improvement in small bowel distention since radiographs dated 08/28/2014.  Colonic stent present.  Decreasing stool burden.    Dg Chest Port 1 View 08/28/2014    No evidence of acute cardiopulmonary disease.   Dg Abd 2 Views 08/28/2014    Slight improvement in small bowel distension compatible with resolving obstruction.     Dg Abd 1 View 08/26/2014  1. No change in small bowel obstruction pattern.     Ct Abdomen Pelvis Wo Contrast 08/26/2014  1. 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 2. Unchanged positioning of the distal colonic stent, including erosion into the colonic wall. Large volume of desiccated stool present above the stent. 3. Extensive hepatic metastatic disease. The largest metastasis in the left lobe has decreased from 07/23/2014.     Dg Abd Acute W/chest 08/24/2014  Chronic retention of stool above of the patient's sigmoid colon stent. The stool appears desiccated and is likely resistant to passing through the stent. No high-grade obstruction or progression since 07/23/2014.      Medical Consultants:  General Surgery Cardiology Infectious Disease WOC  Other Consultants:  Nutrition  IAnti-Infectives:   Primaxin 08/25/14 -->09/06/14 Flagyl 08/25/14 --> 08/29/14   Leisa Lenz, MD  [facility] Pager [phone]  Time spent in minutes: 25 minutes  If 7PM-7AM, please contact night-coverage [email or web address] Password [facility] 09/13/2014, 6:24 PM   LOS: 19 days    HPI/Subjective: No acute overnight events. Patient reports abdominal pain in mid abdomen. Nausea. No vomiting.   Objective: Filed Vitals:   09/12/14 1406 09/12/14 2128 09/13/14 0635 09/13/14 1425  BP: 139/66 127/61 128/63 121/48  Pulse: 69 71 69 73  Temp: 98.5 F (36.9 C) 98.5 F (36.9 C) 98.2 F (36.8 C) 98.2 F (36.8 C)  TempSrc: Oral Oral Oral Oral  Resp: 14 14 16 20   Height:      Weight:   47.492 kg (104 lb 11.2 oz)   SpO2: 99% 98% 99% 100%    Intake/Output Summary (Last 24 hours) at 09/13/14 1824 Last data filed at 09/13/14 1429  Gross per 24 hour  Intake 1461.17 ml  Output    210 ml  Net 1251.17 ml    Exam:   General:  Pt is alert, follows commands appropriately, not in acute distress  Cardiovascular: Regular rate and rhythm, S1/S2, no murmurs  Respiratory: Clear to auscultation bilaterally, no wheezing, no crackles, no rhonchi  Abdomen: Soft, non tender, non distended, bowel sounds present; Colostomy in place   Extremities: No edema, pulses DP and PT palpable bilaterally  Neuro: Grossly nonfocal  Data Reviewed: Basic Metabolic Panel:  Recent Labs Lab 09/07/14  0500 09/08/14 0515 09/09/14 0540 09/10/14 0410 09/11/14 0400  NA 133* 134* 134* 138 136  K 3.7 4.3 3.8 3.8 3.8  CL 101 101 104 104 102  CO2 26 25 26 27 26   GLUCOSE 144* 118* 123* 124* 115*  BUN 12 13 14 13 13   CREATININE 0.39* 0.42* 0.32* 0.39* 0.41*  CALCIUM 7.7* 8.0* 7.8* 8.1* 8.2*  MG 1.7 1.8 1.9 1.8 1.9  PHOS 3.6  --   --   --  4.2   Liver Function Tests:  Recent Labs Lab 09/07/14 0500 09/11/14 0400  AST 22 19  ALT 13* 13*  ALKPHOS 116 127*  BILITOT 0.3 0.3  PROT 4.6* 5.7*  ALBUMIN 1.7* 2.1*   No results for input(s): LIPASE, AMYLASE in the last 168 hours. No results for input(s): AMMONIA in  the last 168 hours. CBC:  Recent Labs Lab 09/11/14 0400  WBC 7.8  NEUTROABS 5.3  HGB 7.5*  HCT 23.9*  MCV 88.2  PLT 190   Cardiac Enzymes: No results for input(s): CKTOTAL, CKMB, CKMBINDEX, TROPONINI in the last 168 hours. BNP: Invalid input(s): POCBNP CBG:  Recent Labs Lab 09/12/14 1626 09/13/14 0001 09/13/14 0622 09/13/14 1146 09/13/14 1637  GLUCAP 113* 114* 81 106* 113*    Recent Results (from the past 240 hour(s))  Culture, blood (routine x 2)     Status: None   Collection Time: 09/04/14  7:58 AM  Result Value Ref Range Status   Specimen Description BLOOD LEFT ARM  Final   Culture   Final    NO GROWTH 5 DAYS   Report Status 09/10/2014 FINAL  Final  Culture, blood (routine x 2)     Status: None   Collection Time: 09/04/14  8:00 AM  Result Value Ref Range Status   Specimen Description RIGHT ANTECUBITAL  Final   Culture   Final    NO GROWTH 5 DAYS   Report Status 09/10/2014 FINAL  Final     Scheduled Meds: . feeding supplement (ENSURE ENLIVE)  237 mL Oral BID BM  . insulin aspart  0-15 Units Subcutaneous 4 times per day  . nicotine  21 mg Transdermal Daily  . pantoprazole (PROTONIX) IV  40 mg Intravenous QHS   Continuous Infusions: . sodium chloride 75 mL/hr at 09/13/14 0734  . Marland KitchenTPN (CLINIMIX-E) Adult 60 mL/hr at 09/13/14 1742   And  . fat emulsion 240 mL (09/13/14 1742)

## 2014-09-14 LAB — COMPREHENSIVE METABOLIC PANEL
ALBUMIN: 2.2 g/dL — AB (ref 3.5–5.0)
ALK PHOS: 119 U/L (ref 38–126)
ALT: 11 U/L — ABNORMAL LOW (ref 14–54)
ANION GAP: 4 — AB (ref 5–15)
AST: 14 U/L — AB (ref 15–41)
BILIRUBIN TOTAL: 0.4 mg/dL (ref 0.3–1.2)
BUN: 14 mg/dL (ref 6–20)
CHLORIDE: 107 mmol/L (ref 101–111)
CO2: 25 mmol/L (ref 22–32)
Calcium: 8 mg/dL — ABNORMAL LOW (ref 8.9–10.3)
Creatinine, Ser: 0.37 mg/dL — ABNORMAL LOW (ref 0.44–1.00)
GFR calc Af Amer: >60 mL/min (ref >60)
GFR calc non Af Amer: >60 mL/min (ref >60)
Glucose, Bld: 106 mg/dL — ABNORMAL HIGH (ref 65–99)
Potassium: 3.5 mmol/L (ref 3.5–5.1)
SODIUM: 136 mmol/L (ref 135–145)
Total Protein: 5.9 g/dL — ABNORMAL LOW (ref 6.5–8.1)

## 2014-09-14 LAB — CBC
HCT: 25.1 % — ABNORMAL LOW (ref 36.0–46.0)
HEMOGLOBIN: 7.6 g/dL — AB (ref 12.0–15.0)
MCH: 26.5 pg (ref 26.0–34.0)
MCHC: 30.3 g/dL (ref 30.0–36.0)
MCV: 87.5 fL (ref 78.0–100.0)
Platelets: 215 10*3/uL (ref 150–400)
RBC: 2.87 MIL/uL — AB (ref 3.87–5.11)
RDW: 17.1 % — ABNORMAL HIGH (ref 11.5–15.5)
WBC: 7 10*3/uL (ref 4.0–10.5)

## 2014-09-14 LAB — MAGNESIUM: Magnesium: 1.8 mg/dL (ref 1.7–2.4)

## 2014-09-14 LAB — GLUCOSE, CAPILLARY
GLUCOSE-CAPILLARY: 111 mg/dL — AB (ref 65–99)
GLUCOSE-CAPILLARY: 107 mg/dL — AB (ref 65–99)
Glucose-Capillary: 109 mg/dL — ABNORMAL HIGH (ref 65–99)
Glucose-Capillary: 114 mg/dL — ABNORMAL HIGH (ref 65–99)

## 2014-09-14 LAB — PHOSPHORUS: Phosphorus: 3.9 mg/dL (ref 2.5–4.6)

## 2014-09-14 MED ORDER — POTASSIUM CHLORIDE 10 MEQ/50ML IV SOLN
10.0000 meq | INTRAVENOUS | Status: AC
  Administered 2014-09-14 (×4): 10 meq via INTRAVENOUS
  Filled 2014-09-14 (×8): qty 50

## 2014-09-14 MED ORDER — FAT EMULSION 20 % IV EMUL
240.0000 mL | INTRAVENOUS | Status: AC
  Administered 2014-09-14: 240 mL via INTRAVENOUS
  Filled 2014-09-14: qty 250

## 2014-09-14 MED ORDER — TRACE MINERALS CR-CU-MN-SE-ZN 10-1000-500-60 MCG/ML IV SOLN
INTRAVENOUS | Status: AC
  Administered 2014-09-14: 18:00:00 via INTRAVENOUS
  Filled 2014-09-14: qty 1440

## 2014-09-14 MED ORDER — POTASSIUM CHLORIDE 10 MEQ/100ML IV SOLN
INTRAVENOUS | Status: AC
  Administered 2014-09-14: 10 meq
  Filled 2014-09-14: qty 100

## 2014-09-14 NOTE — Progress Notes (Addendum)
14 Days Post-Op  Subjective: She is doing OK, no further nausea or dry heaves.  Wound just changed and ostomy bag changed so both look fine.  No stool now, small amount emptied last PM.    Objective: Vital signs in last 24 hours: Temp:  [98.2 F (36.8 C)-98.4 F (36.9 C)] 98.4 F (36.9 C) (06/16 0539) Pulse Rate:  [66-73] 66 (06/16 0539) Resp:  [20] 20 (06/16 0539) BP: (119-121)/(46-48) 119/46 mmHg (06/16 0539) SpO2:  [100 %] 100 % (06/16 0539) Weight:  [47.3 kg (104 lb 4.4 oz)] 47.3 kg (104 lb 4.4 oz) (06/16 0539) Last BM Date: 09/12/14 Nothing recorded for output, voided x 4. 100 stool Afebrile, VSS Labs OK, anemia is stable Intake/Output from previous day: 06/15 0701 - 06/16 0700 In: 4115 [I.V.:1875; TPN:2240] Out: 105 [Drains:5; Stool:100] Intake/Output this shift:    General appearance: alert, cooperative and no distress Resp: clear to auscultation bilaterally GI: soft, not distended, BS still hypoactive, she has only taken sips of water since yesterday and just ordered a tray. Skin:  Her decubitus is stable and doing ok with water current Rx Lab Results:   Recent Labs  09/14/14 0543  WBC 7.0  HGB 7.6*  HCT 25.1*  PLT 215    BMET  Recent Labs  09/14/14 0543  NA 136  K 3.5  CL 107  CO2 25  GLUCOSE 106*  BUN 14  CREATININE 0.37*  CALCIUM 8.0*   PT/INR No results for input(s): LABPROT, INR in the last 72 hours.   Recent Labs Lab 09/11/14 0400 09/14/14 0543  AST 19 14*  ALT 13* 11*  ALKPHOS 127* 119  BILITOT 0.3 0.4  PROT 5.7* 5.9*  ALBUMIN 2.1* 2.2*     Lipase     Component Value Date/Time   LIPASE 30 08/24/2014 0510     Studies/Results: No results found.  Medications: . feeding supplement (ENSURE ENLIVE)  237 mL Oral BID BM  . insulin aspart  0-15 Units Subcutaneous 4 times per day  . nicotine  21 mg Transdermal Daily  . pantoprazole (PROTONIX) IV  40 mg Intravenous QHS  . potassium chloride  10 mEq Intravenous Q1 Hr x 4  .  sodium chloride  10-40 mL Intracatheter Q12H  . sodium chloride  3 mL Intravenous Q12H    Assessment/Plan 1. Rectal cancer, Liver metastasis, rectal stent 06/2014 2. Small Bowel Obstruction, Rectosigmoid cancer with perforation, Pelvic carcinomatosis, Omental central caking/carcinoimatosis, Liver metatasis S/P EXPLORATORY LAPAROTOMY, SMALL BOWEL RESECTION LOW ANTERIOR RECTOSIGMOID RESECTION (including stent) with COLOSTOMY, APPENDECTOMY, HYSTERECTOMY SUPRACERVICAL ABDOMINAL Salpingo-oophorectomy, TRANSVERSE COLON RESECTION WITH PARTIAL OMENTECTOMY, DEBULKING OF PERITONEUM - 09/06/14, Dr. Johney Maine. 2B Post op ileus -cont TNA -patient tolerating clears  3. Sepsis, resolved 4. Anemia  Hgb - 7.6 - 09/14/2014 5.Severe protein calorie malnutrition - TNA 6. Hx of tobacco use 7. Pressure decubitus currently unstageable.   8. Antibiotics: completed 14 days of primaxin none currently  9. DVT: SCD/ No heparin secondary to anemia thrombocytopenia, however platelets back up to 215K (10/14/2014)   Plan:  I am going to leave her on clears for now, I told her if she does really well and has stools in her bag later she can mix it up with some full liquids.  I will up her to fulls at supper, but I will let her decide when to actually increase her diet.  I think she can make this call on her own.    She is up walking.  Wound care has  a new cream that is suppose to draw fluid from site and she is going to try that on this wound to see if it helps.    LOS: 20 days   JENNINGS,WILLARD 09/14/2014  Agree with above. Son in room. Still nauseated, though not as bad as yesterday.  Ostomy still has output. She walked hall x 4 today.  Alphonsa Overall, MD, [facility] Surgery Pager: [phone] Office phone:  [phone]

## 2014-09-14 NOTE — Progress Notes (Addendum)
PARENTERAL NUTRITION CONSULT NOTE - Follow-up  Pharmacy Consult for TPN Indication: Persistent SBO  Allergies  Allergen Reactions  . Avelox [Moxifloxacin Hcl In Nacl] Hives  . Codeine Hives  . Dextromethorphan Hives  . Erythromycin Hives  . Penicillins Hives    No problem with Imipenem  . Suprep [Na Sulfate-K Sulfate-Mg Sulf] Nausea And Vomiting    Patient Measurements: Height: 5' 7"  (170.2 cm) Weight: 104 lb 4.4 oz (47.3 kg) IBW/kg (Calculated) : 61.6 Usual Weight: 50.4 kg  Vital Signs: Temp: 98.4 F (36.9 C) (06/16 0539) Temp Source: Oral (06/16 0539) BP: 119/46 mmHg (06/16 0539) Pulse Rate: 66 (06/16 0539) Intake/Output from previous day: 06/15 0701 - 06/16 0700 In: 4115 [I.V.:1875; TPN:2240] Out: 105 [Drains:5; Stool:100]  Labs:  Recent Labs  09/14/14 0543  WBC 7.0  HGB 7.6*  HCT 25.1*  PLT 215     Recent Labs  09/14/14 0543  NA 136  K 3.5  CL 107  CO2 25  GLUCOSE 106*  BUN 14  CREATININE 0.37*  CALCIUM 8.0*  MG 1.8  PHOS 3.9  PROT 5.9*  ALBUMIN 2.2*  AST 14*  ALT 11*  ALKPHOS 119  BILITOT 0.4   Estimated Creatinine Clearance: 57.9 mL/min (by C-G formula based on Cr of 0.37).    Recent Labs  09/13/14 1637 09/14/14 0011 09/14/14 0537  GLUCAP 113* 114* 111*   Medications, infusions:  . sodium chloride 75 mL/hr at 09/13/14 2117  . Marland KitchenTPN (CLINIMIX-E) Adult 60 mL/hr at 09/13/14 1742   And  . fat emulsion 240 mL (09/13/14 1742)   Insulin Requirements: Moderate SSI q6h: 0 units / 24 hours.  Current Nutrition: clear liquid diet w/ Ensure ordered BID, refused both yesterday  IVF: NS at 75 ml/hr  Central access: CVC triple lumen, port L chest TPN start date: 6/1  ASSESSMENT                                                                                                          HPI: 89yoF with PMH metastatic rectal cancer and rectal stent 06/2014, s/p 3 cycles chemo presents 5/27 with N/V x 1 day.  Found to have SBO, likely secondary  to pelvic tumor on CT.  NG tube placed and later removed once SBO improved on Abd XR.  Started on CL diet, but persistent N/V prevent meaningful PO intake.  To start on TPN per pharmacy for nutritional supplementation until tolerating PO.  Significant events:  5/31: patient misunderstood Surgeon to imply that TPN was not going to start today and did not allow TPN to be initiated. 6/2: s/p exp laparotomy, SB resection, LAR, stent, colostomy, appendectomy, hysterectomy, salpingo-oophorectomy, transverse colon resection, debulking of peritoneum. 6/6: clamp NG tube 6/7: NGT d/c'ed 6/9: started CLD 6/12: small amount of stool in ostomy, + flatus 6/14: low iron - feraheme 510 mg given 6/15 Nausea last evening following Ensure  Today:   Glucose: at goal (< 150), no Hx DM  Electrolytes: wnl except K borderline low  Renal: weight trending down last 3 day. SCr low,  stable  LFTs: AST WNL, ALT low, Alk Phos and Tbili WNL (6/16)  TGs: elevated at baseline at 226 (6/1), now wnl at 116  Prealbumin: 13.2 (6/1), 6 (6/6), 9.8 (6/13)  NUTRITIONAL GOALS                                                                                             RD recs: Nutrition needs: 1600-1800 kcal, 70-80 grams protein.  Clinimix 5/20 @ 60 mL/hr + 20% IV fat emulsion at 10 ml/hr to provide 72 grams protein and 1746 kcal   PLAN                                                                                                                         At 1800 today:  Continue Clinimix E 5/20 IV at goal rate of 60 ml/hr.  Expect will wait until tolerating full liquids/soft diet to begin weaning TPN.  Continue 20% IV fat emulsion at 10 ml/hr.  TPN to contain standard multivitamins and trace elements.  Will give KCl 10 mEq IV x 4  IVF per MD orders, NS at 75 ml/hr.    Continue CBGs and moderate SSI q6h.  TPN lab panels on Mondays & Thursdays.  F/u ability to advance diet, then ability to tolerate  Reuel Boom, PharmD Pager: [phone] 09/14/2014, 7:55 AM

## 2014-09-14 NOTE — Progress Notes (Signed)
Patient ID: Sydney Morrison, female   DOB: 22-Feb-1956, 59 y.o.   MRN: [id number] [facility] PROGRESS NOTE  SHANTALE HOLTMEYER XIH:[id number] DOB: 07/15/1955 DOA: 08/25/2014 PCP: Delphina Cahill, MD  Brief narrative:    59 year old female with past medical history of rectal cancer with liver metastasis, rectal stent placement in 06/2014, on chemotherapy (last cycle completed 08/16/2014) under Dr. Gearldine Shown care. She presented to [facility] ED 08/25/2014 with ongoing nausea, vomiting and abdominal pain. She was found to have small bowel obstruction  And has failed conservative management. She underwent exploratory laparotomy on 08/31/2014 with lower anterior rectosigmoid resection with colostomy. She is also s/p appendectomy, hysterectomy, and salpingoophorectomy, and debulking of peritoneum. Postoperatively she developed ileus. She is currently on nutritional support with TPN. Hospital course is complicated with development of sepsis due to possible pneumonia. She was on flagyl, Primaxin. All antibiotics stopped 09/06/2014.  Barrier to discharge: Once nutritional status improves.  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 - Pt is status post exploratory laparotomy on 08/31/2014 with lower anterior rectosigmoid resection with colostomy. She is also s/p appendectomy, hysterectomy, and salpingoophorectomy, and debulking of peritoneum by Dr. Michael Boston - Post operatively she developed ileus - She is currently on TPN for nutritional support - Her current diet consists of clear liquid and she reports not being able to tolerate this. She mentioned to me this morning she avoids drinking anything because. Automatically causes nausea and vomiting. - Appreciate  surgery recommendations.  Active Problems: Sepsis secondary to possible PNA - CXR on 09/04/2014 showed right lung base opacity concerning for pneumonia - As indicated in anti-infective section, pt was on primaxin and flagyl. Antibodies all stopped at this point per infectious disease recommendations. - No reports of fevers. White blood cell count is within normal limits.  Anemia of chronic disease  - Likely secondary to history of malignancy.  - patient has received total 3 units of PRBC transfusion during this hospital stay. - No report of bleeding.  Rectal ca with liver mets - Last chemotherapy back in 07/2014 - Outpt follow up with Dr. Benay Spice   Chest pain and Aflutter with RVR - 2 D ECHO with preserved EF - Cardiology has seen the pt in consultation; last note 6/7/206 - A flutter occurred in the setting of multiple medical issues including hypokalemia, resolving SBO, metastatic rectal cancer. Max rate >200. Symptoms resolved after emesis resolved. No further episodes on telemetry post op - Per cardio, if recurrent sustained episodes, can treat with IV metoprolol for rate control. - Patient has no complaints of chest pain at this time.   Thrombocytopenia - Secondary to history of malignancy - Platelet count normalized   Deep tissue pressure injury - Appreciate very much wound care assessment. Patient has deep tissue pressure injury in evolution on the left side of gluteal cleft, measures 6.5cm x 5.0cm with grey soft eschar that obscures majority of wound bed. Dressing changes recommendations: Soft silicone dressing changed 09/14/2014.  Tobacco abuse - Will continue nicotine patch as tolerated.  Severe protein calorie malnutrition - In the context of chronic illness, malignancy -  continue TPN for nutritional support. Clear liquids as patient tolerates.     DVT Prophylaxis  - SCD's bilaterally due to thrombocytopenia  Code Status: Full.  Family Communication:  plan of  care discussed with the patient Disposition Plan: Home once her nutrition improves.   IV access:  Peripheral IV  Procedures and diagnostic studies:    08/31/14: EXPLORATORY LAPAROTOMY SMALL BOWEL RESECTION LOW ANTERIOR RECTOSIGMOID RESECTION (including stent) with COLOSTOMY; APPENDECTOMY, HYSTERECTOMY, SUPRACERVICAL ABDOMINAL Salpingo-oophorectomy, TRANSVERSE COLON RESECTION WITH PARTIAL OMENTECTOMY DEBULKING OF PERITONEUM  Dg Chest Port 1 View 09/04/2014  1. New right lung base opacity.   Electronically Signed   By: Kerby Moors M.D.   On: 09/04/2014 15:16   Dg Chest Port 1 View 09/02/2014   No active disease.   Electronically Signed   By: Earle Gell M.D.   On: 09/02/2014 17:31   Dg Chest Port 1 View 08/31/2014   1. Left IJ line noted ending about the distal SVC. 2. Minimal bibasilar atelectasis noted.  Lungs otherwise clear.     Dg Abd Acute W/chest 08/30/2014   Continued mild prominence of mid abdominal small bowel loops with scattered air-fluid levels. Large stool burden throughout the colon. No real change since prior study.   Electronically Signed   By: Rolm Baptise M.D.   On: 08/30/2014 10:34   Dg Abd 2 Views 08/29/2014  Further improvement in small bowel distention since radiographs dated 08/28/2014.  Colonic stent present.  Decreasing stool burden.    Dg Chest Port 1 View 08/28/2014    No evidence of acute cardiopulmonary disease.   Dg Abd 2 Views 08/28/2014    Slight improvement in small bowel distension compatible with resolving obstruction.     Dg Abd 1 View 08/26/2014  1. No change in small bowel obstruction pattern.     Ct Abdomen Pelvis Wo Contrast 08/26/2014  1. 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 2. Unchanged positioning of the distal colonic stent, including erosion into the colonic wall. Large volume of desiccated stool present above the stent. 3. Extensive hepatic metastatic disease. The largest metastasis in the left lobe has  decreased from 07/23/2014.     Dg Abd Acute W/chest 08/24/2014  Chronic retention of stool above of the patient's sigmoid colon stent. The stool appears desiccated and is likely resistant to passing through the stent. No high-grade obstruction or progression since 07/23/2014.      Medical Consultants:  General Surgery Cardiology Infectious Disease WOC  Other Consultants:  Nutrition  IAnti-Infectives:   Primaxin 08/25/14 -->09/06/14 Flagyl 08/25/14 --> 08/29/14   Leisa Lenz, MD  [facility] Pager [phone]  Time spent in minutes: 25 minutes  If 7PM-7AM, please contact night-coverage [email or web address] Password [email or web address] 09/14/2014, 12:08 PM   LOS: 20 days    HPI/Subjective: No acute overnight events. Patient reports abdominal pain in mid abdomen. Nausea. No vomiting.   Objective: Filed Vitals:   09/12/14 2128 09/13/14 0635 09/13/14 1425 09/14/14 0539  BP: 127/61 128/63 121/48 119/46  Pulse: 71 69 73 66  Temp: 98.5 F (36.9 C) 98.2 F (36.8 C) 98.2 F (36.8 C) 98.4 F (36.9 C)  TempSrc: Oral Oral Oral Oral  Resp: 14 16 20 20   Height:      Weight:  47.492 kg (104 lb 11.2 oz)  47.3 kg (104 lb 4.4 oz)  SpO2: 98% 99% 100% 100%    Intake/Output Summary (Last 24 hours) at 09/14/14 1208 Last data filed at 09/14/14 0700  Gross per 24 hour  Intake   4115 ml  Output    105 ml  Net   4010 ml  Exam:   General:  Pt is alert, follows commands appropriately, not in acute distress  Cardiovascular: Regular rate and rhythm, S1/S2, no murmurs  Respiratory: Clear to auscultation bilaterally, no wheezing, no crackles, no rhonchi  Abdomen: Soft, non tender, non distended, bowel sounds present; Colostomy in place   Extremities: No edema, pulses DP and PT palpable bilaterally  Neuro: Grossly nonfocal  Data Reviewed: Basic Metabolic Panel:  Recent Labs Lab 09/08/14 0515 09/09/14 0540 09/10/14 0410 09/11/14 0400 09/14/14 0543  NA 134* 134* 138 136 136  K 4.3 3.8  3.8 3.8 3.5  CL 101 104 104 102 107  CO2 25 26 27 26 25   GLUCOSE 118* 123* 124* 115* 106*  BUN 13 14 13 13 14   CREATININE 0.42* 0.32* 0.39* 0.41* 0.37*  CALCIUM 8.0* 7.8* 8.1* 8.2* 8.0*  MG 1.8 1.9 1.8 1.9 1.8  PHOS  --   --   --  4.2 3.9   Liver Function Tests:  Recent Labs Lab 09/11/14 0400 09/14/14 0543  AST 19 14*  ALT 13* 11*  ALKPHOS 127* 119  BILITOT 0.3 0.4  PROT 5.7* 5.9*  ALBUMIN 2.1* 2.2*   No results for input(s): LIPASE, AMYLASE in the last 168 hours. No results for input(s): AMMONIA in the last 168 hours. CBC:  Recent Labs Lab 09/11/14 0400 09/14/14 0543  WBC 7.8 7.0  NEUTROABS 5.3  --   HGB 7.5* 7.6*  HCT 23.9* 25.1*  MCV 88.2 87.5  PLT 190 215   Cardiac Enzymes: No results for input(s): CKTOTAL, CKMB, CKMBINDEX, TROPONINI in the last 168 hours. BNP: Invalid input(s): POCBNP CBG:  Recent Labs Lab 09/13/14 0622 09/13/14 1146 09/13/14 1637 09/14/14 0011 09/14/14 0537  GLUCAP 81 106* 113* 114* 111*    Recent Results (from the past 240 hour(s))  Culture, blood (routine x 2)     Status: None   Collection Time: 09/04/14  7:58 AM  Result Value Ref Range Status   Specimen Description BLOOD LEFT ARM  Final   Culture   Final    NO GROWTH 5 DAYS   Report Status 09/10/2014 FINAL  Final  Culture, blood (routine x 2)     Status: None   Collection Time: 09/04/14  8:00 AM  Result Value Ref Range Status   Specimen Description RIGHT ANTECUBITAL  Final   Culture   Final    NO GROWTH 5 DAYS   Report Status 09/10/2014 FINAL  Final     Scheduled Meds: . feeding supplement (ENSURE ENLIVE)  237 mL Oral BID BM  . insulin aspart  0-15 Units Subcutaneous 4 times per day  . nicotine  21 mg Transdermal Daily  . pantoprazole (PROTONIX) IV  40 mg Intravenous QHS   Continuous Infusions: . sodium chloride 75 mL/hr at 09/14/14 1100  . Marland KitchenTPN (CLINIMIX-E) Adult 60 mL/hr at 09/13/14 1742   And  . fat emulsion 240 mL (09/13/14 1742)  . Marland KitchenTPN (CLINIMIX-E) Adult      And  . fat emulsion

## 2014-09-14 NOTE — Consult Note (Signed)
WOC wound follow up Wound type: deep tissue pressure injury (DTPI) in evolution; hospital acquired, at left side of gluteal cleft Measurement: 6.5cm x 5.0cm; grey soft eschar obscures majority of wound bed. Small area of underlying tissue evident at periphery. The silicone dressing is assisting with autolysis. Wound bed:See above Drainage (amount, consistency, odor) Scant drainage on old silicone dressing consistent with slow autolysis of eschar Periwound:intact. Dressing procedure/placement/frequency: Soft silicone dressing changed today after showing injury to MD.   This is now an Unstageable pressure injury (full thickness). Patient is keeping off of area by ambulating several times a day and spending time in a chair upon a pressure redistribution chair cushion.   WOC ostomy follow up Stoma type/location: LLQ colostomy Stomal assessment/size: 1 and 3/4 inches round, budded, edematous, moist Peristomal assessment: intact Treatment options for stomal/peristomal skin: skin barrier ring (due to stoma size changing anticipated) Output: brown effluent Ostomy pouching: 2pc, 2 and 3/4 inch pouching system with skin barrier ring.  Education provided: Some ostomy effluent had leaked behind skin barrier today.  Will plan on changes every 3 days. Enrolled patient in [place] Start Discharge program: Yes  [place] nursing team will follow, and will remain available to this patient, the nursing, surgical and medical teams.  Thanks, Maudie Flakes, MSN, RN, [place], [place], [place] [phone])

## 2014-09-15 ENCOUNTER — Inpatient Hospital Stay (HOSPITAL_COMMUNITY): Payer: 59

## 2014-09-15 DIAGNOSIS — E43 Unspecified severe protein-calorie malnutrition: Secondary | ICD-10-CM | POA: Diagnosis present

## 2014-09-15 DIAGNOSIS — Z720 Tobacco use: Secondary | ICD-10-CM

## 2014-09-15 LAB — CBC
HEMATOCRIT: 25.1 % — AB (ref 36.0–46.0)
Hemoglobin: 7.7 g/dL — ABNORMAL LOW (ref 12.0–15.0)
MCH: 27 pg (ref 26.0–34.0)
MCHC: 30.7 g/dL (ref 30.0–36.0)
MCV: 88.1 fL (ref 78.0–100.0)
Platelets: 217 10*3/uL (ref 150–400)
RBC: 2.85 MIL/uL — ABNORMAL LOW (ref 3.87–5.11)
RDW: 17.2 % — AB (ref 11.5–15.5)
WBC: 7 10*3/uL (ref 4.0–10.5)

## 2014-09-15 LAB — BASIC METABOLIC PANEL
ANION GAP: 7 (ref 5–15)
BUN: 14 mg/dL (ref 6–20)
CO2: 26 mmol/L (ref 22–32)
Calcium: 8.3 mg/dL — ABNORMAL LOW (ref 8.9–10.3)
Chloride: 103 mmol/L (ref 101–111)
Creatinine, Ser: 0.36 mg/dL — ABNORMAL LOW (ref 0.44–1.00)
GFR calc Af Amer: >60 mL/min (ref >60)
Glucose, Bld: 113 mg/dL — ABNORMAL HIGH (ref 65–99)
POTASSIUM: 3.7 mmol/L (ref 3.5–5.1)
SODIUM: 136 mmol/L (ref 135–145)

## 2014-09-15 LAB — GLUCOSE, CAPILLARY
GLUCOSE-CAPILLARY: 115 mg/dL — AB (ref 65–99)
GLUCOSE-CAPILLARY: 123 mg/dL — AB (ref 65–99)
GLUCOSE-CAPILLARY: 96 mg/dL (ref 65–99)
GLUCOSE-CAPILLARY: 96 mg/dL (ref 65–99)
Glucose-Capillary: 101 mg/dL — ABNORMAL HIGH (ref 65–99)

## 2014-09-15 MED ORDER — OXYCODONE HCL 5 MG PO TABS: 5.0000 mg | ORAL_TABLET | ORAL | Status: DC | PRN

## 2014-09-15 MED ORDER — SODIUM CHLORIDE 0.9 % IV SOLN
25.0000 mg | Freq: Four times a day (QID) | INTRAVENOUS | Status: DC | PRN
  Filled 2014-09-15: qty 1

## 2014-09-15 MED ORDER — TRACE MINERALS CR-CU-MN-SE-ZN 10-1000-500-60 MCG/ML IV SOLN
INTRAVENOUS | Status: AC
  Administered 2014-09-15: 18:00:00 via INTRAVENOUS
  Filled 2014-09-15: qty 1440

## 2014-09-15 MED ORDER — ACETAMINOPHEN 500 MG PO TABS
1000.0000 mg | ORAL_TABLET | Freq: Three times a day (TID) | ORAL | Status: DC
Start: 2014-09-15 — End: 2014-09-20
  Administered 2014-09-16: 1000 mg via ORAL
  Filled 2014-09-15 (×2): qty 2

## 2014-09-15 MED ORDER — METOCLOPRAMIDE HCL 5 MG/ML IJ SOLN: 5.0000 mg | Freq: Four times a day (QID) | INTRAMUSCULAR | Status: DC | PRN

## 2014-09-15 MED ORDER — ONDANSETRON HCL 8 MG PO TABS
8.0000 mg | ORAL_TABLET | Freq: Three times a day (TID) | ORAL | Status: DC
  Filled 2014-09-15 (×3): qty 1

## 2014-09-15 MED ORDER — FAT EMULSION 20 % IV EMUL
240.0000 mL | INTRAVENOUS | Status: AC
  Administered 2014-09-15: 240 mL via INTRAVENOUS
  Filled 2014-09-15: qty 250

## 2014-09-15 MED ORDER — POTASSIUM CHLORIDE 10 MEQ/100ML IV SOLN
10.0000 meq | INTRAVENOUS | Status: AC
  Administered 2014-09-15 (×2): 10 meq via INTRAVENOUS
  Filled 2014-09-15: qty 100

## 2014-09-15 MED ORDER — HYDROMORPHONE HCL 1 MG/ML IJ SOLN
0.5000 mg | INTRAMUSCULAR | Status: DC | PRN
  Filled 2014-09-15: qty 1

## 2014-09-15 MED ORDER — ZOLPIDEM TARTRATE 5 MG PO TABS
5.0000 mg | ORAL_TABLET | Freq: Every evening | ORAL | Status: DC | PRN
  Administered 2014-09-16 – 2014-09-17 (×2): 5 mg via ORAL
  Filled 2014-09-15 (×2): qty 1

## 2014-09-15 MED ORDER — IOHEXOL 300 MG/ML  SOLN
100.0000 mL | Freq: Once | INTRAMUSCULAR | Status: AC | PRN
  Administered 2014-09-15: 100 mL via INTRAVENOUS

## 2014-09-15 MED ORDER — IOHEXOL 300 MG/ML  SOLN
50.0000 mL | Freq: Once | INTRAMUSCULAR | Status: AC | PRN
  Administered 2014-09-15: 50 mL via ORAL

## 2014-09-15 NOTE — Progress Notes (Signed)
[name]  1955/11/26 [id number]  Patient Care Team: Delphina Cahill, MD as PCP - General (Internal Medicine) Ladell Pier, MD as Consulting Physician (Oncology) Carol Ada, MD as Consulting Physician (Gastroenterology) Michael Boston, MD as Consulting Physician (General Surgery)  This patient is a 59 y.o.female who calls today for surgical evaluation.   SURGERY: 08/31/2014  2:10 PM  PATIENT: Sydney Morrison 59 y.o. 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 Status post stenting. Has some suspicious omental caking in the lower abdomen. Liver metastasis. Underwent 3 rounds of palliative chemotherapy. Decrease disease. However patient developed small bowel obstruction. Initially improved and worsen. Transition zone seems to be ileum adherent to the rectosigmoid cancer. Recommendation made for abdominal exploration with release of obstruction. Probably need for bowel resection discussed. Probable need for resection of primary versus diverting colostomy discussed as well. Patient was to be aggressive and try and remove as much cancer if possible. Discussed possibility of needing gastrostomy tube is well. She wished to hold off on that but was not totally against  it.  OR FINDINGS: Distal ileum with adhesions to the rectosigmoid mesentery and uterus due to carcinomatosis causing point of small bowel obstruction.  Central omental caking adherent to anterior bladder. Adherent to and probably invading into transverse colon.  Bulky cancer at rectosigmoid junction. Somewhat contained old perforation. No new abscess. Full of hard impacted stool throughout colon  Nodularity to left anterior right anterior and especially over bladder and uterus consistent with carcinomatosis by frozen section. No evidence of carcinomatosis above the pelvis.  Liver with bulky metastatic disease.    -CT scan just done (patient refused all week until Friday PM) - reviewed w radiology - prelim eval argues against any major concerns at this time. Some poorly organized fluid in left suprapubic region.  Most likely seroma.  No other major fluid collections.  No evidence of obstruction.  Some gas in the bladder most likely consistent with chronic foley recently. .  Some stranding on right posterior lung consistent with ongoing a resolving inflammation/pneumonia.  Clinically seems to imply PNA resolving since it was clinically suspicious earlier this admission and now on room air OK off ABx.  She does have an enlarged stomach with no obstruction or mass there.  Do not know if this is gastroparesis from prolonged obstruction from the metastatic disease and will need more time to more consistently open up.  I think it is okay to remove the pelvic drain.  One less thing.  Do not know if chasing the fluid collection in the left suprapubic pelvic region would be of much help but something to consider.  Do not think it is causing obstruction at this point.  Agree with aggressive nutrition try to try and help her recover.  Cachexia severe in this woman with free and pelvic carcinomatosis and liver metastases.  However still trying to be aggressive.  We will try and increase Zofran to  8mg  4 times a day.  Retry nausea medicines.  Perhaps try  Thorazine.  She tends to refuse all these and just wants to take Ativan.  Not a great long-term solution.  Agree with palliative care to see what other recommendations can be made.  I discussed scopolamine patch with her.  Do not know if she allowed that to continue from time of surgery.  ?Med issues - change narcotics?  Prognosis is still rather rough but wished to try and be aggressive to see if primary and peritoneal disease can be better palliated with this surgery and then hopefully get to a point where can focus on liver metastatic issues and buy her some time.   reconsider intraperitoneal chemotherapy at [facility] if markedly improved/stabilizes.  Think she could not handle that now.  Severe malnutrition and concern for family support and issues of her coping/overriding recommendations of care all major barriers.  I worry how much anxiety and depression is underneath all this.  Agree with medicine and palliative care involvement.   Patient Active Problem List   Diagnosis Date Noted  . Protein-calorie malnutrition, severe 09/15/2014    Priority: High  . 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 Tobacco abuse 09/15/2014  . Leucocytosis   . Goals of care, counseling/discussion   . Screen for STD (sexually transmitted disease)   . Pressure ulcer, stage 1   . Postoperative fever   . Arterial hypotension   . Atrial flutter 08/30/2014  . SBO (small bowel obstruction) 08/26/2014  . Nausea and vomiting 08/25/2014  . Antineoplastic chemotherapy induced anemia 08/24/2014  . Dehydration 08/24/2014  . 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 Chronic blood loss anemia 07/02/2014    Past Medical History  Diagnosis Date  . Crohn's disease   . Chronic headache   . Cancer   . Nausea and vomiting 08/25/2014    Past Surgical History  Procedure Laterality Date  . Breast surgery     . Uterine fibroid surgery    . Colonoscopy w/ biopsies  06/29/2014    DR HUNG  . Flexible sigmoidoscopy N/A 06/30/2014    Procedure: FLEXIBLE SIGMOIDOSCOPY;  Surgeon: Carol Ada, MD;  Location: [facility] ENDOSCOPY;  Service: Endoscopy;  Laterality: N/A;  . Colonic stent placement N/A 06/30/2014    Procedure: COLONIC STENT PLACEMENT;  Surgeon: Carol Ada, MD;  Location[facility] ENDOSCOPY;  Service: Endoscopy;  Laterality: N/A;  with fluro   . Portacath placement N/A 07/04/2014    Procedure: POWER PORT PLACEMENT;  Surgeon: Alphonsa Overall, MD;  Location: [place];  Service: General;  Laterality: N/A;  . Laparotomy N/A 08/31/2014    Procedure: EXPLORATORY LAPAROTOMY;  Surgeon: Michael Boston, MD;  Location: [facility];  Service: General;  Laterality: N/A;  . Bowel resection N/A 08/31/2014    Procedure: SMALL BOWEL RESECTION;  Surgeon: Michael Boston, MD;  Location: [facility];  Service: General;  Laterality: N/A;  . Colostomy N/A 08/31/2014    Procedure: low anterior resection with COLOSTOMY;  Surgeon: Michael Boston, MD;  Location: [facility];  Service: General;  Laterality: N/A;  . Appendectomy  08/31/2014    Procedure: APPENDECTOMY;  Surgeon: Michael Boston, MD;  Location: [facility];  Service: General;;  . Supracervical abdominal hysterectomy  08/31/2014    Procedure: HYSTERECTOMY SUPRACERVICAL ABDOMINAL BILATERAL TUBES AND OVARIES;  Surgeon: Michael Boston, MD;  Location: [facility];  Service: General;;  . Transverse colon resection  08/31/2014    Procedure: TRANSVERSE COLON RESECTION;  Surgeon: Michael Boston, MD;  Location: [facility];  Service: General;;  .  Debulking  08/31/2014    Procedure: DEBULKING OF PERITONEUM;  Surgeon: Michael Boston, MD;  Location: [facility];  Service: General;;    History   Social History  . Marital Status: Single    Spouse Name: N/A  . Number of Children: N/A  . Years of Education: N/A   Occupational History  . Not on file.   Social History Main Topics  . Smoking status: Former Smoker -- 0.50 packs/day for 40 years     Types: Cigarettes    Quit date: 06/25/2014  . Smokeless tobacco: Never Used  . Alcohol Use: No  . Drug Use: No  . Sexual Activity: Not on file   Other Topics Concern  . Not on file   Social History Narrative   Married, husband Roderic Palau   Two grown children   Strong christian faith    Family History  Problem Relation Age of Onset  . Hypertension Mother   . Cancer Father     Current Facility-Administered Medications  Medication Dose Route Frequency Provider Last Rate Last Dose  . 0.9 %  sodium chloride infusion   Intravenous Continuous Rahul P Desai, PA-C 75 mL/hr at 09/15/14 5784    . acetaminophen (TYLENOL) tablet 1,000 mg  1,000 mg Oral TID Michael Boston, MD      . chlorproMAZINE (THORAZINE) 25 mg in sodium chloride 0.9 % 25 mL IVPB  25 mg Intravenous Q6H PRN Michael Boston, MD      . TPN (CLINIMIX-E) Adult   Intravenous Continuous TPN Lynelle Doctor, RPH 60 mL/hr at 09/15/14 1814     And  . fat emulsion 20 % infusion 240 mL  240 mL Intravenous Continuous TPN Anh P Pham, RPH 10 mL/hr at 09/15/14 1814 240 mL at 09/15/14 1814  . feeding supplement (ENSURE ENLIVE) (ENSURE ENLIVE) liquid 237 mL  237 mL Oral BID BM Maricela Bo Ostheim, RD   237 mL at 09/12/14 1600  . HYDROmorphone (DILAUDID) injection 0.5-1 mg  0.5-1 mg Intravenous Q2H PRN Michael Boston, MD      . hydrOXYzine (ATARAX/VISTARIL) tablet 25 mg  25 mg Oral Q6H PRN Donne Hazel, MD   25 mg at 09/12/14 1751  . insulin aspart (novoLOG) injection 0-15 Units  0-15 Units Subcutaneous 4 times per day Donne Hazel, MD   2 Units at 09/15/14 0631  . LORazepam (ATIVAN) injection 0.5-1 mg  0.5-1 mg Intravenous Q4H PRN Michael Boston, MD   1 mg at 09/14/14 2051  . metoCLOPramide (REGLAN) injection 5-10 mg  5-10 mg Intravenous Q6H PRN Michael Boston, MD      . nicotine (NICODERM CQ - dosed in mg/24 hours) patch 21 mg  21 mg Transdermal Daily Hosie Poisson, MD   21 mg at 09/15/14 6962  . nitroGLYCERIN (NITROSTAT) SL tablet 0.4 mg  0.4 mg  Sublingual Q5 min PRN Hosie Poisson, MD      . ondansetron (ZOFRAN) tablet 8 mg  8 mg Oral TID AC & HS Michael Boston, MD      . oxyCODONE (Oxy IR/ROXICODONE) immediate release tablet 5-10 mg  5-10 mg Oral Q4H PRN Michael Boston, MD      . phenol (CHLORASEPTIC) mouth spray 2 spray  2 spray Mouth/Throat PRN Leighton Ruff, MD   2 spray at 08/26/14 1600  . promethazine (PHENERGAN) injection 6.25-12.5 mg  6.25-12.5 mg Intravenous Q4H PRN Michael Boston, MD   12.5 mg at 09/12/14 2230  . sodium chloride 0.9 % injection 10-40 mL  10-40 mL  Intracatheter Q12H Robbie Lis, MD   10 mL at 09/07/14 0935  . sodium chloride 0.9 % injection 10-40 mL  10-40 mL Intracatheter PRN Robbie Lis, MD   10 mL at 09/15/14 1836  . sodium chloride 0.9 % injection 3 mL  3 mL Intravenous Q12H Robbie Lis, MD   3 mL at 09/13/14 2116  . zolpidem (AMBIEN) tablet 5 mg  5 mg Oral QHS PRN Michael Boston, MD         Allergies  Allergen Reactions  . Avelox [Moxifloxacin Hcl In Nacl] Hives  . Codeine Hives  . Dextromethorphan Hives  . Erythromycin Hives  . Penicillins Hives    No problem with Imipenem  . Suprep [Na Sulfate-K Sulfate-Mg Sulf] Nausea And Vomiting    BP 124/57 mmHg  Pulse 77  Temp(Src) 98.7 F (37.1 C) (Oral)  Resp 20  Ht 5\' 7"  (1.702 m)  Wt 49.125 kg (108 lb 4.8 oz)  BMI 16.96 kg/m2  SpO2 100%  Ct Abdomen Pelvis Wo Contrast  08/26/2014   CLINICAL DATA:  Nausea and vomiting. Left lower quadrant pain for 3 days.  EXAM: CT ABDOMEN AND PELVIS WITHOUT CONTRAST  TECHNIQUE: Multidetector CT imaging of the abdomen and pelvis was performed following the standard protocol without IV contrast.  COMPARISON:  07/23/2014  FINDINGS: BODY WALL: No contributory findings.  LOWER CHEST: No acute findings  ABDOMEN/PELVIS:  Liver: Multi focal hepatic metastatic disease with faint mineralization. The masses appear decreased from comparison study, especially the dominant left lobe mass which now measures 6 cm (previously 9 cm).   Biliary: No evidence of biliary obstruction or stone.  Pancreas: Unremarkable.  Spleen: Unremarkable.  Adrenals: Unremarkable.  Kidneys and ureters: No hydronephrosis. Punctate left nephrolithiasis  Bladder: Unremarkable.  Bowel: There is a stably positioned stent in the sigmoid colon to upper rectum with unchanged surrounding bowel wall thickening and erosion of the poximal stent into the wall, likely reaching the serosa. Formed stool present throughout the colon. High-density material is seen within the noninflamed appendix. Major change is diffuse dilation of small bowel with fluid levels, with transition point likely in the pelvis where there is peritoneal metastatic disease. No evidence of bowel necrosis or perforation  Peritoneum: Diffuse peritoneal metastases coating in the pelvic peritoneal recesses. Small ascites.  Vascular: No acute abnormality.  OSSEOUS: No acute abnormalities.  IMPRESSION: 1. 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 2. Unchanged positioning of the distal colonic stent, including erosion into the colonic wall. Large volume of desiccated stool present above the stent. 3. Extensive hepatic metastatic disease. The largest metastasis in the left lobe has decreased from 07/23/2014.   Electronically Signed   By: Monte Fantasia M.D.   On: 08/26/2014 02:00   Dg Abd 1 View  08/26/2014   CLINICAL DATA:  Metastatic colon cancer. Followup bowel obstruction.  EXAM: ABDOMEN - 1 VIEW  COMPARISON:  08/25/2014.  FINDINGS: Position of the distal colonic stent is unchanged from 08/24/2014. Again noted are multiple dilated loops of small bowel consistent with bowel obstruction. The degree of bowel distention is not significantly improved from previous exam.  IMPRESSION: 1. No change in small bowel obstruction pattern.   Electronically Signed   By: Kerby Moors M.D.   On: 08/26/2014 09:22   Dg Chest Port 1 View  09/04/2014   CLINICAL DATA:  Sepsis.  Fever,  weakness and cough  EXAM: PORTABLE CHEST - 1 VIEW  COMPARISON:  09/02/2014  FINDINGS: There  is a left subclavian catheter with tip in the projection of the SVC. Left IJ catheter is also noted with tip in the SVC. The nasogastric tube is in the stomach. There is no pleural effusion identified. New opacity is identified in the right base. Left lung appears clear.  IMPRESSION: 1. New right lung base opacity.   Electronically Signed   By: Kerby Moors M.D.   On: 09/04/2014 15:16   Dg Chest Port 1 View  09/02/2014   CLINICAL DATA:  Generalized body aches.  Weakness.  Leukocytosis.  EXAM: PORTABLE CHEST - 1 VIEW  COMPARISON:  08/31/2014  FINDINGS: Support lines and tubes in appropriate position. Heart size is normal. Both lungs remain clear. No evidence of pleural effusion or pneumothorax.  IMPRESSION: No active disease.   Electronically Signed   By: Earle Gell M.D.   On: 09/02/2014 17:31   Dg Chest Port 1 View  08/31/2014   CLINICAL DATA:  Central line placement.  Initial encounter.  EXAM: PORTABLE CHEST - 1 VIEW  COMPARISON:  Chest radiograph performed 08/30/2014  FINDINGS: A left IJ line is noted ending about the distal SVC. A left-sided chest port is noted ending about the mid to distal SVC. The patient's enteric tube is seen extending below the diaphragm.  Pulmonary vascularity is at the upper limits of normal. Minimal bibasilar atelectasis is noted. No pleural effusion or pneumothorax is seen.  The cardiomediastinal silhouette is normal in size. No acute osseous abnormalities are identified.  IMPRESSION: 1. Left IJ line noted ending about the distal SVC. 2. Minimal bibasilar atelectasis noted.  Lungs otherwise clear.   Electronically Signed   By: Garald Balding M.D.   On: 08/31/2014 21:54   Dg Chest Port 1 View  08/28/2014   CLINICAL DATA:  Chest pain, tachycardia, history of colon cancer  EXAM: PORTABLE CHEST - 1 VIEW  COMPARISON:  08/24/2014  FINDINGS: Lungs are clear.  No pleural effusion or  pneumothorax.  The heart is normal in size.  Left chest port terminates at the cavoatrial junction.  IMPRESSION: No evidence of acute cardiopulmonary disease.   Electronically Signed   By: Julian Hy M.D.   On: 08/28/2014 19:54   Dg Abd 2 Views  08/29/2014   CLINICAL DATA:  Recent removal nasogastric tube for small bowel obstruction. Persistent nausea and vomiting.  EXAM: ABDOMEN - 2 VIEW  COMPARISON:  Abdominal radiographs 08/28/2014 and 08/26/2014. Acute abdominal series 08/24/2014.  FINDINGS: Prior Port-A-Cath projects over the left chest, with the distal tip projecting over the distal superior vena cava. The lung bases are clear. Heart size is normal.  There are scattered air-fluid levels within small bowel and colon on the upright view. No evidence of pneumoperitoneum. Air-fluid level is noted in the gastric fundus.  Gaseous distention of a central small bowel loop appears decreased compared to the radiographs of 08/28/2014. Small bowel now measures 2.5 cm (previously 3.8 cm in this region). Gas is seen within a bowel loop in the right lower quadrant, projecting over the right iliac bone and right sacrum; it is difficult to determine if this is dilated small bowel or colon. There is a moderate amount of stool within the colon; overall, colonic stool burden has decreased since 08/26/2014. A distal colonic stent is again noted.  IMPRESSION: Further improvement in small bowel distention since radiographs dated 08/28/2014.  Colonic stent present.  Decreasing stool burden.   Electronically Signed   By: Curlene Dolphin M.D.   On: 08/29/2014 11:27  Dg Abd 2 Views  08/28/2014   CLINICAL DATA:  Abdominal discomfort and distension. Small bowel obstruction.  EXAM: ABDOMEN - 2 VIEW  COMPARISON:  08/26/2014  FINDINGS: Stable position of the colonic stent in the mid pelvis. Slight improvement in the small bowel distension compared to 08/26/2014. NG tube in the proximal stomach. Minimal change in the residual  colonic stool burden. No acute osseous finding.  IMPRESSION: Slight improvement in small bowel distension compatible with resolving obstruction.   Electronically Signed   By: Jerilynn Mages.  Shick M.D.   On: 08/28/2014 08:37   Dg Abd Acute W/chest  08/30/2014   CLINICAL DATA:  Follow-up small bowel obstruction. Intermittent abdominal pain with nausea and vomiting.  EXAM: DG ABDOMEN ACUTE W/ 1V CHEST  COMPARISON:  08/29/2014  FINDINGS: Large stool burden throughout the colon. Continued mildly prominent mid abdominal small bowel loops with scattered air-fluid levels. No free air organomegaly. Presumed colonic stent in the pelvis.  Left Port-A-Cath remains in place, unchanged. Heart is normal size. Mild hyperinflation of the lungs without focal opacity or effusion.  IMPRESSION: Continued mild prominence of mid abdominal small bowel loops with scattered air-fluid levels. Large stool burden throughout the colon. No real change since prior study.   Electronically Signed   By: Rolm Baptise M.D.   On: 08/30/2014 10:34   Dg Abd Acute W/chest  08/24/2014   CLINICAL DATA:  Chemotherapy for colon cancer. Dehydration. Diffuse pain.  EXAM: DG ABDOMEN ACUTE W/ 1V CHEST  COMPARISON:  Abdominal CT 07/23/2014  FINDINGS: Chronic retention of stool above a sigmoid colon metallic stent. No evidence of high-grade obstruction or change from previous. No concerning intra-abdominal mass effect or calcification. Lung bases are clear.  IMPRESSION: Chronic retention of stool above of the patient's sigmoid colon stent. The stool appears desiccated and is likely resistant to passing through the stent. No high-grade obstruction or progression since 07/23/2014.   Electronically Signed   By: Monte Fantasia M.D.   On: 08/24/2014 06:51    Note: This dictation was prepared with Dragon/digital dictation along with [employer]. Any transcriptional errors that result from this process are unintentional.

## 2014-09-15 NOTE — Progress Notes (Signed)
PARENTERAL NUTRITION CONSULT NOTE - Follow-up  Pharmacy Consult for TPN Indication: Persistent SBO  Allergies  Allergen Reactions  . Avelox [Moxifloxacin Hcl In Nacl] Hives  . Codeine Hives  . Dextromethorphan Hives  . Erythromycin Hives  . Penicillins Hives    No problem with Imipenem  . Suprep [Na Sulfate-K Sulfate-Mg Sulf] Nausea And Vomiting    Patient Measurements: Height: _0  (170.2 cm) Weight: 108 lb 4.8 oz (49.125 kg) IBW/kg (Calculated) : 61.6 Usual Weight: 50.4 kg  Vital Signs: Temp: 99.1 F (37.3 C) (06/17 0602) Temp Source: Oral (06/17 0602) BP: 120/55 mmHg (06/17 0602) Pulse Rate: 71 (06/17 0602) Intake/Output from previous day:    Labs:  Recent Labs  09/14/14 0543 09/15/14 0355  WBC 7.0 7.0  HGB 7.6* 7.7*  HCT 25.1* 25.1*  PLT 215 217     Recent Labs  09/14/14 0543 09/15/14 0355  NA 136 136  K 3.5 3.7  CL 107 103  CO2 25 26  GLUCOSE 106* 113*  BUN 14 14  CREATININE 0.37* 0.36*  CALCIUM 8.0* 8.3*  MG 1.8  --   PHOS 3.9  --   PROT 5.9*  --   ALBUMIN 2.2*  --   AST 14*  --   ALT 11*  --   ALKPHOS 119  --   BILITOT 0.4  --    Estimated Creatinine Clearance: 60.1 mL/min (by C-G formula based on Cr of 0.36).    Recent Labs  09/14/14 1715 09/15/14 0029 09/15/14 0557  GLUCAP 109* 115* 123*   Medications, infusions:  . sodium chloride 75 mL/hr at 09/15/14 0633  . Marland KitchenTPN (CLINIMIX-E) Adult 60 mL/hr at 09/14/14 1808   And  . fat emulsion 240 mL (09/14/14 1808)   Insulin Requirements: Moderate SSI q6h: 2 units / 24 hours.  Current Nutrition: clear liquid diet w/ Ensure ordered BID (pt refused taking for the past 2 days)  IVF: NS at 75 ml/hr  Central access: CVC triple lumen, port L chest TPN start date: 6/1  ASSESSMENT                                                                                                          HPI: 10yoF with PMH metastatic rectal cancer and rectal stent 06/2014, s/p 3 cycles chemo presents 5/27  with N/V x 1 day.  Found to have SBO, likely secondary to pelvic tumor on CT.  NG tube placed and later removed once SBO improved on Abd XR.  Started on CL diet, but persistent N/V prevent meaningful PO intake.  To start on TPN per pharmacy for nutritional supplementation until tolerating PO.  Significant events:  5/31: patient misunderstood Surgeon to imply that TPN was not going to start today and did not allow TPN to be initiated. 6/2: s/p exp laparotomy, SB resection, LAR, stent, colostomy, appendectomy, hysterectomy, salpingo-oophorectomy, transverse colon resection, debulking of peritoneum. 6/6: clamp NG tube 6/7: NGT d/c'ed 6/9: started CLD 6/12: small amount of stool in ostomy, + flatus 6/14: low iron - feraheme 510 mg given  Today:   Glucose: at goal (< 150), no Hx DM  Electrolytes: wnl except K borderline low  Renal: SCr low, stable  LFTs: AST WNL, ALT low, Alk Phos and Tbili WNL (6/16)  TGs: elevated at baseline at 226 (6/1), now wnl at 116  Prealbumin: 13.2 (6/1), 6 (6/6), 9.8 (6/13)  NUTRITIONAL GOALS                                                                                             RD recs: Nutrition needs: 1600-1800 kcal, 70-80 grams protein.  Clinimix 5/20 @ 60 mL/hr + 20% IV fat emulsion at 10 ml/hr to provide 72 grams protein and 1746 kcal   PLAN                                                                                                                         1) KCl 10 mEq IV x2 runs 2) At 1800 today:  Continue Clinimix E 5/20 IV at goal rate of 60 ml/hr.    Continue 20% IV fat emulsion at 10 ml/hr.  TPN to contain standard multivitamins and trace elements.  IVF per MD orders, NS at 75 ml/hr.    Continue CBGs and moderate SSI q6h.  TPN lab panels on Mondays & Thursdays.  F/u ability to advance diet, then ability to tolerate  Dia Sitter, PharmD, BCPS 09/15/2014 7:27 AM

## 2014-09-15 NOTE — Progress Notes (Signed)
Nutrition Follow-up   DOCUMENTATION CODES:  Severe malnutrition in context of chronic illness, Underweight  INTERVENTION: - Continue TPN per pharmacy - Continue FLD with Ensure Enlive BID - RD will continue to monitor  NUTRITION DIAGNOSIS:  Malnutrition related to chronic illness as evidenced by percent weight loss, severe depletion of body fat, severe depletion of muscle mass. -ongoing  GOAL:  Patient will meet greater than or equal to 90% of their needs -met with TPN  MONITOR:  Other (Comment), PO intake, Weight trends, Labs, I & O's (TPN)  ASSESSMENT: 59 year old female with past medical history significant for rectal cancer with liver metastasis, rectal stent placement in 06/2014 (under Dr. Gearldine Shown care), status post 3 cycles of chemotherapy, last cycle completed 08/16/2014. She started to develop nausea and vomiting over past 24 hours prior to this admission.  6/17:  - Continues with Clinimix E 5/20 @ 60 mL/hr with 20% lipids @ 10 mL/hr which provides 1746 kcal, 72 grams protein; pharmacy note indicates no plan to wean TPN until pt tolerating FLD - Per surgery note 6/15, pt tried Ensure 6/14 and had dry heaving - Pt indicates she has not had anything PO this AM and POs are not going well - She had jello and broth for dinner last night and feels that CLD is not any bette than FLD as related to ability to tolerate - She indicates abdominal pain and nausea this AM - Meeting needs with TPN - Medications reviewed; PRN Reglan and scheduled Zofran - Labs: CBGs: 81-123 mg/dL, Ca: 8.3 mg/dL    5/29: - Pt followed by [place] RD, last seen 5/18. - Pt NPO for bowel rest d/t SBO. Pt states that she has not had anything by mouth x 5 days.  - Pt with NGT for suction, output 1000 ml. Recommend nutrition support if bowel rest to last > 7 days.  - Pt at risk for refeeding risk d/t severe malnutrition and poor PO intake PTA. - Pt with 22 lb weight loss since 3/02 (18% weight  loss x 3 months, significant for time frame).  6/2 pt s/p PROCEDURE: Procedure(s): EXPLORATORY LAPAROTOMY SMALL BOWEL RESECTION LOW ANTERIOR RECTOSIGMOID RESECTION (including stent) with COLOSTOMY APPENDECTOMY HYSTERECTOMY SUPRACERVICAL ABDOMINAL Salpingo-oophorectomy TRANSVERSE COLON RESECTION WITH PARTIAL OMENTECTOMY DEBULKING OF PERITONEUM  6/3: - Moved to SDU post-op. - Pt tolerating TPN. Currently running at goal rate of 60 mL/hr providing 1267 kcal (79% of needs) and 72 g protein (100% of needs.)   6/10:  - NGT out since last assessment - Diet advanced to [facility] 09/07/14 and pt reports she has been taking small sips of gingerale and does not like most of the items on the tray - Pt indicates nausea with intakes, no emesis - Continues with Clinimix E 5/20 @ 60 mL/hr with 20% lipids @ 10 mL/hr which provides 1746 kcal, 72 grams protein - Will order [employer] TID to supplement - Pt will need diet education prior to d/c, will provide as appropriate with diet advancement  6/13: - Pt continues with Clinimix E 5/20 @ 60 mL/hr with 20% lipids @ 10 mL/hr which provides 1746 kcal, 72 grams protein - Pt reports that she does not like [employer]. She does like Ensure and would like this ordered with diet advancement. - She has been having nausea that is not exacerbated by intakes and that she feels is associated with pain - Pt started to have output into ostomy 6/12; noted Reglan order in place  Height:  Ht Readings from Last 1 Encounters:  08/25/14 _0  (1.702 m)    Weight:  Wt Readings from Last 1 Encounters:  09/15/14 108 lb 4.8 oz (49.125 kg)    Ideal Body Weight:  61.3 kg  Wt Readings from Last 10 Encounters:  09/15/14 108 lb 4.8 oz (49.125 kg)  08/25/14 110 lb (49.896 kg)  08/24/14 109 lb 12.8 oz (49.805 kg)  08/24/14 110 lb (49.896 kg)  08/16/14 110 lb 11.2 oz (50.213 kg)  08/02/14 113 lb (51.256 kg)  07/23/14 114 lb 5 oz (51.852 kg)  07/14/14 118 lb 14.4  oz (53.933 kg)  06/29/14 120 lb (54.432 kg)  05/31/14 125 lb (56.7 kg)    BMI:  Body mass index is 16.96 kg/(m^2).  Estimated Nutritional Needs:  Kcal:  1600-1800  Protein:  70-80g  Fluid:  1.6L/day  Skin:  Wound (see comment) (Stage II sacral ulcer)  Diet Order:  TPN (CLINIMIX-E) Adult Diet full liquid Room service appropriate?: Yes; Fluid consistency:: Thin TPN (CLINIMIX-E) Adult  EDUCATION NEEDS:  No education needs identified at this time   Intake/Output Summary (Last 24 hours) at 09/15/14 1127 Last data filed at 09/14/14 1800  Gross per 24 hour  Intake      0 ml  Output      5 ml  Net     -5 ml    Last BM:  6/15   Jarome Matin, RD, LDN Inpatient Clinical Dietitian Pager # [phone] After hours/weekend pager # [phone]

## 2014-09-15 NOTE — Progress Notes (Signed)
[facility]  Patient Status: New patient this admission to [facility]   [facility] is providing the following services: HHRN and Home TNA Pharmacy Team for home TNA as ordered. [facility] hospital Infusion Coordinator will provide in hospital teaching re: TNA at home to support pt and family independence once discharged.  [facility] will need orders stating "Home TNA per [facility] protocol for labs and dosing" upon DC home when stable/ordered by MD.   If patient discharges after hours, please call [phone].   Sydney Morrison 09/15/2014, 5:33 PM

## 2014-09-15 NOTE — Progress Notes (Addendum)
Patient ID: Sydney Morrison, female   DOB: 1955-05-30, 59 y.o.   MRN: [id number] [facility] PROGRESS NOTE  Sydney Morrison QIW:[id number] DOB: 05-23-1955 DOA: 08/25/2014 PCP: Delphina Cahill, MD  Brief narrative:    59 year old female with past medical history of rectal cancer with liver metastasis, rectal stent placement in 06/2014, on chemotherapy (last cycle completed 08/16/2014) under Dr. Gearldine Shown care. She presented to [facility] ED 08/25/2014 with ongoing nausea, vomiting and abdominal pain. She was found to have small bowel obstruction  And has failed conservative management. She underwent exploratory laparotomy on 08/31/2014 with lower anterior rectosigmoid resection with colostomy. She is also s/p appendectomy, hysterectomy, and salpingoophorectomy, and debulking of peritoneum. Postoperatively she developed ileus. She is currently on nutritional support with TPN. Hospital course is complicated with development of sepsis due to possible pneumonia. She was on flagyl, Primaxin. All antibiotics stopped 09/06/2014.  Barrier to discharge: Patient very emotional about her current situation. She opened up to me about her situation at home. Her husband, sons, sister are not understanding how sick she is. She reports she does not feel well and she is not able to tolerate even clear liquid diet. She was hoping that after surgery she would be able to start solid foods soon but that has not happened and as mentioned she can't even tolerate clears. This has in a way made her angry and depressed but she reports her family does not understand that. She reports that she can't tolerate food that has red dye in it. I suggested to her to have her husband bring the food (clear liquid) of her choice, something that she likes but she said "my husband will not do that because he is already frustrated with the whole situation". She told me he is upset that he has missed so much work so far. Her family keeps telling her that she will be  okay, she just needs to get little stronger and hopefully get that chemotherapy but she is doubtful that this will happen. I talked to her about setting a different goal, a goal that involves controlling her nausea rather than being worried about trying to tolerate clear liquid diet or solid food. We also talked about potentially going home and continuing TPN at home. She would like that but not sure how much of support and help she can get with TPN infusions other than what advanced care can help out with. She also has a sacral deep tissue injury that requires dressing changes and all of that may pose more burden to her husband. For the time being I will place the order for her to be able to go outside because that may make her feel better emotionally.   Assessment/Plan:    Principal Problem: Small bowel obstruction / Nausea, vomiting and abdominal pain / Postoperative ileus - A mentioned above, pt presented to [facility] with N/V and abdominal pain.  - CT abdomen on 08/26/2014 showed high-grade distal small bowel obstruction, likely from peritoneal metastatic disease. Patient did not improve with conservative management. - Pt is status post exploratory laparotomy on 08/31/2014 with lower anterior rectosigmoid resection with colostomy. She is also s/p appendectomy, hysterectomy, and salpingoophorectomy, and debulking of peritoneum by Dr. Michael Boston - Post operatively she developed ileus.  - She is currently on TPN for nutritional support. She is unable to tolerate any clear liquid diet. She is nauseous and her nausea does not seem to be controlled at this point. She is not vomiting. At this  point she is on when necessary hydroxyzine, Ativan, Reglan, Zofran. I think it's reasonable to have palliative care involved to help with symptom control, nausea specifically. I think if we are able to control nausea then she will be able to tolerate some by mouth intake.  - Appreciate surgery recommendations.  Active  Problems: Sepsis secondary to possible PNA - CXR on 09/04/2014 showed right lung base opacity concerning for pneumonia - As indicated in anti-infective section, pt was on primaxin and flagyl. Antibodies all stopped at this point per infectious disease recommendations. - No reports of fevers. White blood cell count is within normal limits.  Anemia of chronic disease  - Likely secondary to history of malignancy.  - Patient has received total 3 units of PRBC transfusion during this hospital stay. - Hemoglobin this morning is 7.7. - No report of bleeding.  Rectal ca with liver mets - Last chemotherapy back in 07/2014 - Outpt follow up with Dr. Benay Spice.   Chest pain and Aflutter with RVR - 2 D ECHO with preserved EF - Cardiology has seen the pt in consultation; last note 6/7/206 - A flutter occurred in the setting of multiple medical issues including hypokalemia, resolving SBO, metastatic rectal cancer. Max rate >200. Symptoms resolved after emesis resolved. No further episodes on telemetry post op - Per cardio, if recurrent sustained episodes, can treat with IV metoprolol for rate control.  Thrombocytopenia - Secondary to history of malignancy - Platelet count normalized   Deep tissue pressure injury - Appreciate very much wound care assessment. Patient has deep tissue pressure injury in evolution on the left side of gluteal cleft, measures 6.5cm x 5.0cm with grey soft eschar that obscures majority of wound bed. Dressing changes recommendations: Soft silicone dressing changed 09/14/2014.  Tobacco abuse - Continue nicotine patch as tolerated.  Severe protein calorie malnutrition / Failure to thrive / Underweight  - In the context of chronic illness, malignancy - Continue TPN for nutritional support.  - Body mass index is 16.96 kg/(m^2).    DVT Prophylaxis  - SCD's bilaterally due to thrombocytopenia    Code Status: Full.  Family Communication:  plan of care discussed with the  patient Disposition Plan: Home once her nutrition improves.   IV access:  Peripheral IV  Procedures and diagnostic studies:    08/31/14: EXPLORATORY LAPAROTOMY SMALL BOWEL RESECTION LOW ANTERIOR RECTOSIGMOID RESECTION (including stent) with COLOSTOMY; APPENDECTOMY, HYSTERECTOMY, SUPRACERVICAL ABDOMINAL Salpingo-oophorectomy, TRANSVERSE COLON RESECTION WITH PARTIAL OMENTECTOMY DEBULKING OF PERITONEUM  Dg Chest Port 1 View 09/04/2014  1. New right lung base opacity.   Electronically Signed   By: Kerby Moors M.D.   On: 09/04/2014 15:16   Dg Chest Port 1 View 09/02/2014   No active disease.   Electronically Signed   By: Earle Gell M.D.   On: 09/02/2014 17:31   Dg Chest Port 1 View 08/31/2014   1. Left IJ line noted ending about the distal SVC. 2. Minimal bibasilar atelectasis noted.  Lungs otherwise clear.     Dg Abd Acute W/chest 08/30/2014   Continued mild prominence of mid abdominal small bowel loops with scattered air-fluid levels. Large stool burden throughout the colon. No real change since prior study.   Electronically Signed   By: Rolm Baptise M.D.   On: 08/30/2014 10:34   Dg Abd 2 Views 08/29/2014  Further improvement in small bowel distention since radiographs dated 08/28/2014.  Colonic stent present.  Decreasing stool burden.    Dg Chest Port 1 View 08/28/2014  No evidence of acute cardiopulmonary disease.   Dg Abd 2 Views 08/28/2014    Slight improvement in small bowel distension compatible with resolving obstruction.     Dg Abd 1 View 08/26/2014  1. No change in small bowel obstruction pattern.     Ct Abdomen Pelvis Wo Contrast 08/26/2014  1. 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 2. Unchanged positioning of the distal colonic stent, including erosion into the colonic wall. Large volume of desiccated stool present above the stent. 3. Extensive hepatic metastatic disease. The largest metastasis in the left lobe has decreased from 07/23/2014.      Dg Abd Acute W/chest 08/24/2014  Chronic retention of stool above of the patient's sigmoid colon stent. The stool appears desiccated and is likely resistant to passing through the stent. No high-grade obstruction or progression since 07/23/2014.      Medical Consultants:  General Surgery Cardiology Infectious Disease WOC  Other Consultants:  Nutrition  IAnti-Infectives:   Primaxin 08/25/14 -->09/06/14 Flagyl 08/25/14 --> 08/29/14   Leisa Lenz, MD  [facility] Pager [phone]  Time spent in minutes: 25 minutes  If 7PM-7AM, please contact night-coverage [email or web address] Password [facility] 09/15/2014, 12:52 PM   LOS: 21 days    HPI/Subjective: No acute overnight events. Nausea ongoing.   Objective: Filed Vitals:   09/14/14 0539 09/14/14 1345 09/14/14 2150 09/15/14 0602  BP: 119/46 126/55 104/46 120/55  Pulse: 66 82 70 71  Temp: 98.4 F (36.9 C) 98 F (36.7 C) 98.9 F (37.2 C) 99.1 F (37.3 C)  TempSrc: Oral Oral Oral Oral  Resp: 20 18 18 18   Height:      Weight: 47.3 kg (104 lb 4.4 oz)   49.125 kg (108 lb 4.8 oz)  SpO2: 100% 100% 100% 99%    Intake/Output Summary (Last 24 hours) at 09/15/14 1252 Last data filed at 09/14/14 1800  Gross per 24 hour  Intake      0 ml  Output      5 ml  Net     -5 ml    Exam:   General:  Pt is alert, not in acute distress  Cardiovascular: RRR, S1/S2 (+)  Respiratory: N wheezing, no crackles, no rhonchi  Abdomen: Closely in place, nontender abdomen, appreciate bowel sounds  Extremities: Pulses palpable, no leg swelling  Neuro: No focal deficits  Data Reviewed: Basic Metabolic Panel:  Recent Labs Lab 09/09/14 0540 09/10/14 0410 09/11/14 0400 09/14/14 0543 09/15/14 0355  NA 134* 138 136 136 136  K 3.8 3.8 3.8 3.5 3.7  CL 104 104 102 107 103  CO2 26 27 26 25 26   GLUCOSE 123* 124* 115* 106* 113*  BUN 14 13 13 14 14   CREATININE 0.32* 0.39* 0.41* 0.37* 0.36*  CALCIUM 7.8* 8.1* 8.2* 8.0* 8.3*  MG 1.9 1.8 1.9  1.8  --   PHOS  --   --  4.2 3.9  --    Liver Function Tests:  Recent Labs Lab 09/11/14 0400 09/14/14 0543  AST 19 14*  ALT 13* 11*  ALKPHOS 127* 119  BILITOT 0.3 0.4  PROT 5.7* 5.9*  ALBUMIN 2.1* 2.2*   No results for input(s): LIPASE, AMYLASE in the last 168 hours. No results for input(s): AMMONIA in the last 168 hours. CBC:  Recent Labs Lab 09/11/14 0400 09/14/14 0543 09/15/14 0355  WBC 7.8 7.0 7.0  NEUTROABS 5.3  --   --   HGB 7.5* 7.6* 7.7*  HCT 23.9* 25.1* 25.1*  MCV  88.2 87.5 88.1  PLT 190 215 217   Cardiac Enzymes: No results for input(s): CKTOTAL, CKMB, CKMBINDEX, TROPONINI in the last 168 hours. BNP: Invalid input(s): POCBNP CBG:  Recent Labs Lab 09/14/14 1231 09/14/14 1715 09/15/14 0029 09/15/14 0557 09/15/14 1233  GLUCAP 107* 109* 115* 123* 101*    Recent Results (from the past 240 hour(s))  Culture, blood (routine x 2)     Status: None   Collection Time: 09/04/14  7:58 AM  Result Value Ref Range Status   Specimen Description BLOOD LEFT ARM  Final   Culture   Final    NO GROWTH 5 DAYS   Report Status 09/10/2014 FINAL  Final  Culture, blood (routine x 2)     Status: None   Collection Time: 09/04/14  8:00 AM  Result Value Ref Range Status   Specimen Description RIGHT ANTECUBITAL  Final   Culture   Final    NO GROWTH 5 DAYS   Report Status 09/10/2014 FINAL  Final     Scheduled Meds: . feeding supplement (ENSURE ENLIVE)  237 mL Oral BID BM  . insulin aspart  0-15 Units Subcutaneous 4 times per day  . nicotine  21 mg Transdermal Daily  . pantoprazole (PROTONIX) IV  40 mg Intravenous QHS   Continuous Infusions: . sodium chloride 75 mL/hr at 09/15/14 0633  . Marland KitchenTPN (CLINIMIX-E) Adult 60 mL/hr at 09/14/14 1808   And  . fat emulsion 240 mL (09/14/14 1808)  . Marland KitchenTPN (CLINIMIX-E) Adult     And  . fat emulsion

## 2014-09-15 NOTE — Care Management Note (Signed)
Case Management Note  Patient Details  Name: Sydney Morrison MRN: [id number] Date of Birth: [birth year]  Subjective/Objective: 59 yo pt, POD 15, She presented to [facility] ED 08/25/2014 with ongoing nausea, vomiting and abdominal pain. She was found to have small bowel obstruction. She underwent exploratory laparotomy on 08/31/2014 with lower anterior rectosigmoid resection with colostomy. She is also s/p appendectomy, hysterectomy, and salpingoophorectomy, and debulking of peritoneum. Postoperatively she developed ileus. She is currently on nutritional support with TPN. Hospital course is complicated with development of sepsis due to possible pneumonia.          Action/Plan:Plan to dc home with [place] at discharge.   Expected Discharge Date:   (unknown)               Expected Discharge Plan:  [place]  In-House Referral:     Discharge planning Services  CM Consult  Post Acute Care Choice:    Choice offered to:  Patient  DME Arranged:  N/A DME Agency:  NA  HH Arranged:  PT, RN [place] Agency:  [place]  Status of Service:  In process, will continue to follow  Medicare Important Message Given:    Date Medicare IM Given:    Medicare IM give by:    Date Additional Medicare IM Given:    Additional Medicare Important Message give by:     If discussed at [place] of Stay Meetings, dates discussed:    Additional Comments:Pt is asking to be discharged home with TPN and HH to follow.  Pt had selected [place]. Possible discharge over weekend.   Purcell Mouton, RN 09/15/2014, 10:16 AM

## 2014-09-15 NOTE — Progress Notes (Signed)
[facility] Progress Note  15 Days Post-Op  Subjective: Pt is in good spirits, but frustrated about not being able to eat.  She's having flatus and BM's in ostomy bag.  Having quite a bit of pain, but manageable.  She's happy to have "a chance".  Her hunger is improving, but can't seem to eat much due to nausea.  Nausea medications aren't working.    Objective: Vital signs in last 24 hours: Temp:  [98 F (36.7 C)-99.1 F (37.3 C)] 99.1 F (37.3 C) (06/17 0602) Pulse Rate:  [70-82] 71 (06/17 0602) Resp:  [18] 18 (06/17 0602) BP: (104-126)/(46-55) 120/55 mmHg (06/17 0602) SpO2:  [99 %-100 %] 99 % (06/17 0602) Weight:  [49.125 kg (108 lb 4.8 oz)] 49.125 kg (108 lb 4.8 oz) (06/17 0602) Last BM Date: 09/12/14  Intake/Output from previous day: 06/16 0701 - 06/17 0700 In: -  Out: 5 [Drains:5] Intake/Output this shift:    PE: Gen:  Alert, NAD, pleasant Abd: Soft, very thin, ND, mild tenderness, +BS, no HSM, midline wound very narrow and almost flesh level, right drain with minimal serous drainage, ostomy patent/pink with stool and flatus output.   Lab Results:   Recent Labs  09/14/14 0543 09/15/14 0355  WBC 7.0 7.0  HGB 7.6* 7.7*  HCT 25.1* 25.1*  PLT 215 217   BMET  Recent Labs  09/14/14 0543 09/15/14 0355  NA 136 136  K 3.5 3.7  CL 107 103  CO2 25 26  GLUCOSE 106* 113*  BUN 14 14  CREATININE 0.37* 0.36*  CALCIUM 8.0* 8.3*   PT/INR No results for input(s): LABPROT, INR in the last 72 hours. CMP     Component Value Date/Time   NA 136 09/15/2014 0355   NA 140 08/16/2014 1037   K 3.7 09/15/2014 0355   K 4.2 08/16/2014 1037   CL 103 09/15/2014 0355   CO2 26 09/15/2014 0355   CO2 28 08/16/2014 1037   GLUCOSE 113* 09/15/2014 0355   GLUCOSE 92 08/16/2014 1037   BUN 14 09/15/2014 0355   BUN 23.1 08/16/2014 1037   CREATININE 0.36* 09/15/2014 0355   CREATININE 0.7 08/16/2014 1037   CALCIUM 8.3* 09/15/2014 0355   CALCIUM 9.5 08/16/2014 1037   PROT 5.9* 09/14/2014 0543   PROT 7.5 08/16/2014 1037   ALBUMIN 2.2* 09/14/2014 0543   ALBUMIN 3.8 08/16/2014 1037   AST 14* 09/14/2014 0543   AST 21 08/16/2014 1037   ALT 11* 09/14/2014 0543   ALT 15 08/16/2014 1037   ALKPHOS 119 09/14/2014 0543   ALKPHOS 143 08/16/2014 1037   BILITOT 0.4 09/14/2014 0543   BILITOT 0.28 08/16/2014 1037   GFRNONAA >60 09/15/2014 0355   GFRAA >60 09/15/2014 0355   Lipase     Component Value Date/Time   LIPASE 30 08/24/2014 0510       Studies/Results: No results found.  Anti-infectives: Anti-infectives    Start     Dose/Rate Route Frequency Ordered Stop   09/01/14 1200  imipenem-cilastatin (PRIMAXIN) 250 mg in sodium chloride 0.9 % 100 mL IVPB  Status:  Discontinued     250 mg 200 mL/hr over 30 Minutes Intravenous 4 times per day 09/01/14 1051 09/06/14 1759   08/31/14 1030  clindamycin (CLEOCIN) 900 mg, gentamicin (GARAMYCIN) 240 mg in sodium chloride 0.9 % 1,000 mL for intraperitoneal lavage  Status:  Discontinued      Intraperitoneal To Surgery 08/31/14 1023 08/31/14 1513   08/31/14 0600  clindamycin (CLEOCIN) IVPB 900 mg  900 mg 100 mL/hr over 30 Minutes Intravenous 60 min pre-op 08/30/14 1218 08/31/14 0944   08/31/14 0600  [MAR Hold]  gentamicin (GARAMYCIN) 220 mg in dextrose 5 % 100 mL IVPB     (MAR Hold since 08/31/14 1010)   5 mg/kg  44.1 kg 105.5 mL/hr over 60 Minutes Intravenous 60 min pre-op 08/30/14 1218 08/31/14 1005   08/30/14 2100  imipenem-cilastatin (PRIMAXIN) 250 mg in sodium chloride 0.9 % 100 mL IVPB  Status:  Discontinued     250 mg 200 mL/hr over 30 Minutes Intravenous 3 times per day 08/30/14 1739 09/01/14 1051   08/25/14 1800  imipenem-cilastatin (PRIMAXIN) 250 mg in sodium chloride 0.9 % 100 mL IVPB  Status:  Discontinued     250 mg 200 mL/hr over 30 Minutes Intravenous Every 6 hours 08/25/14 1716 08/30/14 1739   08/25/14 1800  metroNIDAZOLE (FLAGYL) IVPB 500 mg  Status:  Discontinued     500 mg 100 mL/hr over  60 Minutes Intravenous Every 8 hours 08/25/14 1716 08/27/14 1003       Assessment/Plan 1. Rectal cancer, Liver metastasis, rectal stent 06/2014 2. Small Bowel Obstruction, Rectosigmoid cancer with perforation, Pelvic carcinomatosis, Omental central caking/carcinoimatosis, Liver metatasis  -S/P EXPLORATORY LAPAROTOMY, SMALL BOWEL RESECTION LOW ANTERIOR RECTOSIGMOID RESECTION (including stent) with COLOSTOMY, APPENDECTOMY, HYSTERECTOMY SUPRACERVICAL ABDOMINAL Salpingo-oophorectomy, TRANSVERSE COLON RESECTION WITH PARTIAL OMENTECTOMY, DEBULKING OF PERITONEUM - 09/06/14, Dr. Johney Maine. 2B Post op ileus-cont TNA -patient tolerating fulls 3. Sepsis, resolved 4. Anemia  Hgb - 7.6 - 09/14/2014 5.Severe protein calorie malnutrition - TNA 6. Hx of tobacco use 7. Pressure decubitus currently unstageable.  8. Antibiotics: completed 14 days of primaxin none currently  9. DVT: SCD/ No heparin secondary to anemia thrombocytopenia, however platelets back up to 215K (10/14/2014)  Plan:  Would not advance past fulls until we know she can fully tolerate this.  She is taking in very minimal due to nausea.  Continue TPN for now.  Continue ensure.  I've told her she can have food brought in from the outside (maybe smooth panera soup or jello flavor she likes or popsicle)  She is up walking. Wound care has a new cream that is suppose to draw fluid from site and she is going to try that on this wound to see if it helps.    LOS: 21 days    Nat Christen 09/15/2014, 8:44 AM Pager: [phone]  Agree with above.  To obtain CT scan to check anatomy.  Alphonsa Overall, MD, [facility] Surgery Pager: [phone] Office phone:  [phone]

## 2014-09-16 DIAGNOSIS — Z720 Tobacco use: Secondary | ICD-10-CM

## 2014-09-16 DIAGNOSIS — Z515 Encounter for palliative care: Secondary | ICD-10-CM

## 2014-09-16 LAB — GLUCOSE, CAPILLARY
GLUCOSE-CAPILLARY: 103 mg/dL — AB (ref 65–99)
Glucose-Capillary: 108 mg/dL — ABNORMAL HIGH (ref 65–99)
Glucose-Capillary: 118 mg/dL — ABNORMAL HIGH (ref 65–99)
Glucose-Capillary: 121 mg/dL — ABNORMAL HIGH (ref 65–99)

## 2014-09-16 MED ORDER — FAT EMULSION 20 % IV EMUL: 243.0000 mL | INTRAVENOUS | Status: DC

## 2014-09-16 MED ORDER — TRACE MINERALS CR-CU-MN-SE-ZN 10-1000-500-60 MCG/ML IV SOLN: INTRAVENOUS | Status: DC

## 2014-09-16 MED ORDER — SODIUM CHLORIDE 0.9 % IV SOLN
INTRAVENOUS | Status: AC
  Administered 2014-09-16 – 2014-09-17 (×2): via INTRAVENOUS

## 2014-09-16 MED ORDER — TRACE MINERALS CR-CU-MN-SE-ZN 10-1000-500-60 MCG/ML IV SOLN
INTRAVENOUS | Status: AC
  Administered 2014-09-16: 18:00:00 via INTRAVENOUS
  Filled 2014-09-16: qty 1700

## 2014-09-16 MED ORDER — FAT EMULSION 20 % IV EMUL
234.0000 mL | INTRAVENOUS | Status: AC
  Administered 2014-09-16: 234 mL via INTRAVENOUS
  Filled 2014-09-16 (×2): qty 250

## 2014-09-16 NOTE — Consult Note (Signed)
Consultation Note Date: 09/16/2014   Patient Name: Sydney Morrison  DOB: 1956-02-26  MRN: [id number]  Age / Sex: 59 y.o., female   PCP: Delphina Cahill, MD Referring Physician: Robbie Lis, MD  Reason for Consultation: symptom management of nausea   Palliative Care Assessment and Plan Summary of Established Goals of Care and Medical Treatment Preferences   Clinical Assessment/Narrative:   59 year old female with past medical history of rectal cancer with liver metastasis, rectal stent placement in 06/2014, on chemotherapy (last cycle completed 08/16/2014) under Dr. Gearldine Shown care. She presented to [facility] ED 08/25/2014 with ongoing nausea, vomiting and abdominal pain. She was found to have small bowel obstruction And has failed conservative management. She underwent exploratory laparotomy on 08/31/2014 with lower anterior rectosigmoid resection with colostomy. She is also s/p appendectomy, hysterectomy, and salpingoophorectomy, and debulking of peritoneum. Postoperatively she developed ileus. She is currently on nutritional support with TPN.  Hospital course is complicated with development of sepsis due to possible pneumonia. She was on flagyl, Primaxin. All antibiotics stopped 09/06/2014. Patient seen and evaluated by surgery, Dr Johney Maine note reviewed. Patient started on scheduled PO Zofran.   Palliative care consulted for symptom management of nausea, also for psycho social supportive care.   PLAN: 1. Nausea: agree with scheduled PO Zofran. Will monitor. Discussed management of nausea and vomiting with the patient extensively. She does not want to try scheduled Reglan, does not want to try Haldol for nausea. Ok to continue PRN Ativan for now as well.  2. Anorexia/cachexia of malignancy, complicated by intractable nausea: continue clear liquids for now, patient also on TPN. Patient's husband brought her chicken noodle soup from [employer] the other day, patient states she was able to tolerate the broth from  the soup, how ever developed nausea 30 minutes after.  3. Continue to provide psycho social support, active listening, endorsement of the patient's symptoms.  4. FULL code for now.    Contacts/Participants in Discussion: Primary Decision Maker: patient, then her husband HCPOA: yes  Patient states she has designated her husband as her HCPOA agent. She has also completed a living will, she states if she is terminally ill or brain dead: she wants her husband to "pull the plug"  Code Status/Advance Care Planning:  Full code  Has HCPOA paper work and living will done, paperwork not currently available for my review.   Symptom Management:   Nausea: patient states she stays nauseous 24*7. She does not believe any of the anti emetics work. Reglan gave her tachycardia, Zofran she thinks absolutely does nothing for her. Ativan helps some, but makes her "depressed" and "weepy". Phenergan just makes her "loopy". Discussed chronic nausea and vomiting management in detail. Patient does not want to try Haldol. "I'm not taking an anti psychotic for nausea, I'd rather have nausea."    Palliative Prophylaxis: yes  Additional Recommendations (Limitations, Scope, Preferences):  Continue dialogue, engage with patient, continue trust building. Encourage PO.   Psycho-social/Spiritual:   Support System: husband, has 2 sons, 1 son lives in [place], [place], one son has recently moved back home.   Desire for further Chaplaincy support:not at this time.   Prognosis: Unable to determine  Discharge Planning:  to be determined.    Domains of Care: - Physical: nausea, sometimes vomiting. No pain currently - Psychological: some component of mild anxiety.  - Social: lives at home with husband, he is at times on FMLA and at times still trying to work. Patient states her  illness has been a major stressor for her family. She has a son who has recently moved back home, he helps take care of the patient at home.  -  Spiritual: patient states, "I'm christian. God has a plan for me. He is looking after me. I pray for healing" - Cultural: as above - Imminently dying: no - Ethical/Legal: full code, husband is surrogate Media planner.  Values: patient wishes to continue aggressive care, to fight on.   Life limiting illness: metastatic rectal cancer, bowel obstruction.       Chief Complaint/History of Present Illness: nausea vomiting.   Patient self reported history: I was diagnosed with rectal cancer on March 31st this year. There were already 2 spots in my liver. My initial reaction was for someone to operate, to take all that cancer out. I was told I would have to undergo chemotherapy first, after colon stenting was done, and after a port a cath was placed. I did chemotherapy every 2 weeks for 3 rounds. I started vomiting green bile, I was admitted, found to have bowel obstruction. I'm grateful to Dr Johney Maine for doing the surgery, he removed all the organs that I didn't need anymore, all the organs that might have suspicion of cancer spread, like he took out my appendix, scraped my bladder wall, took out my uterus and ovaries.   Dr Benay Spice has plans for me to re start chemotherapy after discharge. I'm on TPN. I'm trying to take the clear liquids as much as I can. I don't like the clear liquids and the broths on my tray, they in fact make me more nauseous. I hate being in the hospital, people don't leave you alone.   I'm not hopeless. See, we are christians. I will never consent to a DNR. I don't want hospice. Isn't palliative care hospice?"    Primary Diagnoses  Present on Admission:  . Nausea and vomiting . Dehydration . SBO (small bowel obstruction) . Arterial hypotension . (Resolved) Bowel perforation . Chronic blood loss anemia . Rectosigmoid cancer metastasized to liver [id number] . Protein-calorie malnutrition, severe  Palliative Review of Systems: Reviewed.  I have reviewed the medical  record, interviewed the patient and family, and examined the patient. The following aspects are pertinent.  Past Medical History  Diagnosis Date  . Crohn's disease   . Chronic headache   . Cancer   . Nausea and vomiting 08/25/2014   History   Social History  . Marital Status: Single    Spouse Name: N/A  . Number of Children: N/A  . Years of Education: N/A   Social History Main Topics  . Smoking status: Former Smoker -- 0.50 packs/day for 40 years    Types: Cigarettes    Quit date: 06/25/2014  . Smokeless tobacco: Never Used  . Alcohol Use: No  . Drug Use: No  . Sexual Activity: Not on file   Other Topics Concern  . None   Social History Narrative   Married, husband Roderic Palau   Two grown children   Strong christian faith   Family History  Problem Relation Age of Onset  . Hypertension Mother   . Cancer Father    Scheduled Meds: . acetaminophen  1,000 mg Oral TID  . feeding supplement (ENSURE ENLIVE)  237 mL Oral BID BM  . insulin aspart  0-15 Units Subcutaneous 4 times per day  . nicotine  21 mg Transdermal Daily  . ondansetron  8 mg Oral TID AC & HS  . sodium  chloride  10-40 mL Intracatheter Q12H  . sodium chloride  3 mL Intravenous Q12H   Continuous Infusions: . sodium chloride 75 mL/hr at 09/15/14 0633  . Marland KitchenTPN (CLINIMIX-E) Adult 60 mL/hr at 09/15/14 1814   And  . fat emulsion 240 mL (09/15/14 1814)   PRN Meds:.chlorproMAZINE (THORAZINE) IV, HYDROmorphone (DILAUDID) injection, hydrOXYzine, LORazepam, metoCLOPramide (REGLAN) injection, nitroGLYCERIN, oxyCODONE, phenol, promethazine, sodium chloride, zolpidem Medications Prior to Admission:  Prior to Admission medications   Medication Sig Start Date End Date Taking? Authorizing Provider  acetaminophen (TYLENOL) 325 MG tablet Take 325 mg by mouth every 6 (six) hours as needed for fever or headache (headahce).    Yes Historical Provider, MD  cromolyn (NASALCROM) 5.2 MG/ACT nasal spray Place 1 spray into both  nostrils at bedtime.   Yes Historical Provider, MD  Cyanocobalamin (VITAMIN B-12 CR) 1500 MCG TBCR Take 1 tablet by mouth daily.   Yes Historical Provider, MD  dexamethasone (DECADRON) 4 MG tablet Take 1 tablet (4 mg total) by mouth 2 (two) times daily. For 2 days. Begin day of pump disconnect. 08/16/14  Yes Ladell Pier, MD  Fluorouracil (ADRUCIL IV) Inject into the vein every 14 (fourteen) days.   Yes Historical Provider, MD  leucovorin in dextrose 5 % 250 mL Inject into the vein every 14 (fourteen) days.   Yes Historical Provider, MD  lidocaine-prilocaine (EMLA) cream Apply 1 application topically as needed. Apply to portacath site 1-2 hours prior to use 07/14/14  Yes Owens Shark, NP  LORazepam (ATIVAN) 0.5 MG tablet Take 1 tablet (0.5 mg total) by mouth every 6 (six) hours as needed for anxiety. 08/21/14  Yes Ladell Pier, MD  nicotine (NICODERM CQ - DOSED IN MG/24 HOURS) 14 mg/24hr patch Place 14 mg onto the skin daily.   Yes Historical Provider, MD  omeprazole (PRILOSEC) 40 MG capsule Take 40 mg by mouth daily as needed (nausea and vomiting).   Yes Historical Provider, MD  ondansetron (ZOFRAN ODT) 4 MG disintegrating tablet 4mg  ODT q4 hours prn nausea/vomit 08/24/14  Yes Orpah Greek, MD  oxaliplatin in dextrose 5 % 500 mL Inject into the vein every 14 (fourteen) days.   Yes Historical Provider, MD  zolpidem (AMBIEN) 5 MG tablet Take 1 tablet (5 mg total) by mouth at bedtime as needed for sleep. 07/14/14  Yes Owens Shark, NP  traMADol (ULTRAM) 50 MG tablet Take 1 tablet (50 mg total) by mouth every 6 (six) hours as needed. 07/23/14   Nat Christen, MD   Allergies  Allergen Reactions  . Avelox [Moxifloxacin Hcl In Nacl] Hives  . Codeine Hives  . Dextromethorphan Hives  . Erythromycin Hives  . Penicillins Hives    No problem with Imipenem  . Suprep [Na Sulfate-K Sulfate-Mg Sulf] Nausea And Vomiting   CBC:    Component Value Date/Time   WBC 7.0 09/15/2014 0355   WBC 5.5  08/16/2014 1037   HGB 7.7* 09/15/2014 0355   HGB 9.6* 08/16/2014 1037   HCT 25.1* 09/15/2014 0355   HCT 30.9* 08/16/2014 1037   PLT 217 09/15/2014 0355   PLT 167 08/16/2014 1037   MCV 88.1 09/15/2014 0355   MCV 85.6 08/16/2014 1037   NEUTROABS 5.3 09/11/2014 0400   NEUTROABS 3.3 08/16/2014 1037   LYMPHSABS 1.5 09/11/2014 0400   LYMPHSABS 1.2 08/16/2014 1037   MONOABS 0.9 09/11/2014 0400   MONOABS 0.9 08/16/2014 1037   EOSABS 0.1 09/11/2014 0400   EOSABS 0.2 08/16/2014 1037   BASOSABS  0.1 09/11/2014 0400   BASOSABS 0.0 08/16/2014 1037   Comprehensive Metabolic Panel:    Component Value Date/Time   NA 136 09/15/2014 0355   NA 140 08/16/2014 1037   K 3.7 09/15/2014 0355   K 4.2 08/16/2014 1037   CL 103 09/15/2014 0355   CO2 26 09/15/2014 0355   CO2 28 08/16/2014 1037   BUN 14 09/15/2014 0355   BUN 23.1 08/16/2014 1037   CREATININE 0.36* 09/15/2014 0355   CREATININE 0.7 08/16/2014 1037   GLUCOSE 113* 09/15/2014 0355   GLUCOSE 92 08/16/2014 1037   CALCIUM 8.3* 09/15/2014 0355   CALCIUM 9.5 08/16/2014 1037   AST 14* 09/14/2014 0543   AST 21 08/16/2014 1037   ALT 11* 09/14/2014 0543   ALT 15 08/16/2014 1037   ALKPHOS 119 09/14/2014 0543   ALKPHOS 143 08/16/2014 1037   BILITOT 0.4 09/14/2014 0543   BILITOT 0.28 08/16/2014 1037   PROT 5.9* 09/14/2014 0543   PROT 7.5 08/16/2014 1037   ALBUMIN 2.2* 09/14/2014 0543   ALBUMIN 3.8 08/16/2014 1037    Physical Exam: Vital Signs: BP 114/44 mmHg  Pulse 68  Temp(Src) 98.7 F (37.1 C) (Oral)  Resp 18  Ht 5\' 7"  (1.702 m)  Wt 47.265 kg (104 lb 3.2 oz)  BMI 16.32 kg/m2  SpO2 100% SpO2: SpO2: 100 % O2 Device: O2 Device: Not Delivered O2 Flow Rate: O2 Flow Rate (L/min): 0 L/min Intake/output summary:  Intake/Output Summary (Last 24 hours) at 09/16/14 0807 Last data filed at 09/16/14 0612  Gross per 24 hour  Intake 500.66 ml  Output    105 ml  Net 395.66 ml   LBM: Last BM Date: 09/12/14 Baseline Weight: Weight: 49.578  kg (109 lb 4.8 oz) Most recent weight: Weight: 47.265 kg (104 lb 3.2 oz)  Exam Findings:   age appropriate lady resting in bed NAD Ostomy with liquid stool No edema Non focal Chest clear          Palliative Performance Scale: 40%             Additional Data Reviewed: Recent Labs     09/14/14  0543  09/15/14  0355  WBC  7.0  7.0  HGB  7.6*  7.7*  PLT  215  217  NA  136  136  BUN  14  14  CREATININE  0.37*  0.36*     Time BW:6203 Time Out: *0915 Time Total: 60 Greater than 50%  of this time was spent counseling and coordinating care related to the above assessment and plan.  Signed by: Loistine Chance, MD  Loistine Chance, MD  09/16/2014, 8:07 AM  Please contact Palliative Medicine Team phone at [phone] for questions and concerns.

## 2014-09-16 NOTE — Progress Notes (Signed)
Patient ID: Sydney Morrison, female   DOB: 1955/10/08, 59 y.o.   MRN: [id number]  [facility]  Subjective: Patient in bed.  No complaints.  Happy to have last drain removed last night.  Taking very limited amounts of full liquids.  TNA up.  Objective: Vital signs in last 24 hours: Temp:  [98.3 F (36.8 C)-98.7 F (37.1 C)] 98.7 F (37.1 C) (06/18 0458) Pulse Rate:  [62-77] 68 (06/18 0458) Resp:  [18-20] 18 (06/18 0458) BP: (114-124)/(44-57) 114/44 mmHg (06/18 0458) SpO2:  [100 %] 100 % (06/18 0458) Weight:  [47.265 kg (104 lb 3.2 oz)] 47.265 kg (104 lb 3.2 oz) (06/18 0458) Last BM Date: 09/12/14  Intake/Output from previous day: 06/17 0701 - 06/18 0700 In: 500.7 [I.V.:40; TPN:460.7] Out: 110 [Drains:10; Stool:100] Intake/Output this shift:    Physical Exam: HEENT - sclerae clear, mucous membranes moist Neck - soft Chest - clear bilaterally Cor - RRR Abdomen - soft without distension; dressings dry and intact; thick pasty stool in bag Ext - no edema, non-tender Neuro - alert & oriented, no focal deficits  Lab Results:   Recent Labs  09/14/14 0543 09/15/14 0355  WBC 7.0 7.0  HGB 7.6* 7.7*  HCT 25.1* 25.1*  PLT 215 217   BMET  Recent Labs  09/14/14 0543 09/15/14 0355  NA 136 136  K 3.5 3.7  CL 107 103  CO2 25 26  GLUCOSE 106* 113*  BUN 14 14  CREATININE 0.37* 0.36*  CALCIUM 8.0* 8.3*   PT/INR No results for input(s): LABPROT, INR in the last 72 hours. Comprehensive Metabolic Panel:    Component Value Date/Time   NA 136 09/15/2014 0355   NA 136 09/14/2014 0543   NA 140 08/16/2014 1037   NA 138 08/02/2014 1112   K 3.7 09/15/2014 0355   K 3.5 09/14/2014 0543   K 4.2 08/16/2014 1037   K 4.4 08/02/2014 1112   CL 103 09/15/2014 0355   CL 107 09/14/2014 0543   CO2 26 09/15/2014 0355   CO2 25 09/14/2014 0543   CO2 28 08/16/2014 1037   CO2 25 08/02/2014 1112   BUN 14 09/15/2014 0355   BUN 14 09/14/2014 0543   BUN  23.1 08/16/2014 1037   BUN 22.0 08/02/2014 1112   CREATININE 0.36* 09/15/2014 0355   CREATININE 0.37* 09/14/2014 0543   CREATININE 0.7 08/16/2014 1037   CREATININE 0.7 08/02/2014 1112   GLUCOSE 113* 09/15/2014 0355   GLUCOSE 106* 09/14/2014 0543   GLUCOSE 92 08/16/2014 1037   GLUCOSE 96 08/02/2014 1112   CALCIUM 8.3* 09/15/2014 0355   CALCIUM 8.0* 09/14/2014 0543   CALCIUM 9.5 08/16/2014 1037   CALCIUM 9.6 08/02/2014 1112   AST 14* 09/14/2014 0543   AST 19 09/11/2014 0400   AST 21 08/16/2014 1037   AST 21 08/02/2014 1112   ALT 11* 09/14/2014 0543   ALT 13* 09/11/2014 0400   ALT 15 08/16/2014 1037   ALT 14 08/02/2014 1112   ALKPHOS 119 09/14/2014 0543   ALKPHOS 127* 09/11/2014 0400   ALKPHOS 143 08/16/2014 1037   ALKPHOS 175* 08/02/2014 1112   BILITOT 0.4 09/14/2014 0543   BILITOT 0.3 09/11/2014 0400   BILITOT 0.28 08/16/2014 1037   BILITOT 0.28 08/02/2014 1112   PROT 5.9* 09/14/2014 0543   PROT 5.7* 09/11/2014 0400   PROT 7.5 08/16/2014 1037   PROT 7.6 08/02/2014 1112   ALBUMIN 2.2* 09/14/2014 0543   ALBUMIN 2.1* 09/11/2014 0400  ALBUMIN 3.8 08/16/2014 1037   ALBUMIN 3.6 08/02/2014 1112    Studies/Results: Ct Abdomen Pelvis W Contrast  09/15/2014   CLINICAL DATA:  59 year old female with metastatic rectal cancer status post recent extensive abdominal surgery including resection of omental metastases, ovarian metastases, supracervical hysterectomy, multifocal small and large bowel resection and reanastomosis, and descending colostomy. Persistent nausea and vomiting. Subsequent encounter.  EXAM: CT ABDOMEN AND PELVIS WITH CONTRAST  TECHNIQUE: Multidetector CT imaging of the abdomen and pelvis was performed using the standard protocol following bolus administration of intravenous contrast.  CONTRAST:  50mL OMNIPAQUE IOHEXOL 300 MG/ML SOLN, 192mL OMNIPAQUE IOHEXOL 300 MG/ML SOLN  COMPARISON:  Acute abdominal series 08/30/14 and earlier. Preoperative CT Abdomen and Pelvis  08/25/2014  FINDINGS: New since the prior CT right lower lobe tree-in-bud and irregular peri-bronchovascular opacity (series 4, image 22). No pleural or pericardial effusion. Elsewhere the lung bases appear stable.  No acute osseous abnormality identified.  Interval distal large bowel resection along with the uterus and ovaries. Blind ending rectal stump with adjacent and right lower quadrant percutaneous postoperative drain. Presacral mild presacral stranding and trace fluid. No definite residual tumor identified in the pelvis.  Small volume of gas in the urinary bladder (series 2, image 83). No bladder catheter at this time.  There is also DN decubitus subcutaneous stranding which is new around the coccyx greater on the left of midline (series 2, image 81). No bony erosion identified.  Left lower abdomen descending colostomy with no adverse features. Upstream there is a mixture of dilute contrast and retained stool. There are 2 bowel anastomoses in the right abdomen with somewhat patulous appearing but contrast opacified intervening colon. Stomach and duodenum appear within normal limits. No dilated small bowel. There are several pockets of fluid in the distal small bowel mesentery (arrows on series 2, image 74). These are not definitely rim enhancing.  Metastatic disease to the liver re- identified with the largest liver metastases measuring 5-6 cm diameter. When compared to the noncontrast recent comparison no significant changes demonstrated.  Gallbladder, spleen, pancreas and adrenal glands remain within normal limits. Kidneys are within normal limits. Distal ureters are included on delayed excretory phase images and appear normal.  No pneumoperitoneum. No free fluid in the upper abdomen. Major arterial structures in the abdomen and pelvis appear patent. Aortoiliac calcified atherosclerosis noted. The portal venous system appears patent. No lymphadenopathy identified.  Ventral abdominal wound with no adverse  features.  IMPRESSION: 1. No bowel obstruction suspected status post multifocal small and large bowel resection with both primary reanastomoses and descending colostomy. Patulous appearance of colon in the right abdomen might reflect focal ileus. 2. Pockets of fluid in the distal small bowel mesentery are nonspecific. Trace presacral fluid about the right abdominal approach percutaneous postoperative drain. 3. Stable fairly extensive liver metastases. 4. New right lower lobe tree-in-bud and peri-bronchovascular opacity indicative of bronchopneumonia. No pleural effusion. Study discussed by telephone with Dr. Johney Maine On 09/15/2014 at 2241 hrs.   Electronically Signed   By: Genevie Ann M.D.   On: 09/15/2014 20:52    Anti-infectives: Anti-infectives    Start     Dose/Rate Route Frequency Ordered Stop   09/01/14 1200  imipenem-cilastatin (PRIMAXIN) 250 mg in sodium chloride 0.9 % 100 mL IVPB  Status:  Discontinued     250 mg 200 mL/hr over 30 Minutes Intravenous 4 times per day 09/01/14 1051 09/06/14 1759   08/31/14 1030  clindamycin (CLEOCIN) 900 mg, gentamicin (GARAMYCIN) 240 mg in sodium  chloride 0.9 % 1,000 mL for intraperitoneal lavage  Status:  Discontinued      Intraperitoneal To Surgery 08/31/14 1023 08/31/14 1513   08/31/14 0600  clindamycin (CLEOCIN) IVPB 900 mg     900 mg 100 mL/hr over 30 Minutes Intravenous 60 min pre-op 08/30/14 1218 08/31/14 0944   08/31/14 0600  [MAR Hold]  gentamicin (GARAMYCIN) 220 mg in dextrose 5 % 100 mL IVPB     (MAR Hold since 08/31/14 1010)   5 mg/kg  44.1 kg 105.5 mL/hr over 60 Minutes Intravenous 60 min pre-op 08/30/14 1218 08/31/14 1005   08/30/14 2100  imipenem-cilastatin (PRIMAXIN) 250 mg in sodium chloride 0.9 % 100 mL IVPB  Status:  Discontinued     250 mg 200 mL/hr over 30 Minutes Intravenous 3 times per day 08/30/14 1739 09/01/14 1051   08/25/14 1800  imipenem-cilastatin (PRIMAXIN) 250 mg in sodium chloride 0.9 % 100 mL IVPB  Status:  Discontinued     250  mg 200 mL/hr over 30 Minutes Intravenous Every 6 hours 08/25/14 1716 08/30/14 1739   08/25/14 1800  metroNIDAZOLE (FLAGYL) IVPB 500 mg  Status:  Discontinued     500 mg 100 mL/hr over 60 Minutes Intravenous Every 8 hours 08/25/14 1716 08/27/14 1003      Assessment & Plans: Rectal cancer, Liver metastasis, rectal stent 06/2014  Small Bowel Obstruction, Rectosigmoid cancer with perforation, Pelvic carcinomatosis, Omental central caking/carcinoimatosis, Liver metatasis -S/P EXPLORATORY LAPAROTOMY, SMALL BOWEL RESECTION LOW ANTERIOR  RECTOSIGMOID RESECTION (including stent) with COLOSTOMY, APPENDECTOMY,  HYSTERECTOMY SUPRACERVICAL ABDOMINAL Salpingo-oophorectomy, TRANSVERSE  COLON RESECTION WITH PARTIAL OMENTECTOMY, DEBULKING OF PERITONEUM -  09/06/14, Dr. Johney Maine.  Post op ileus  -cont TNA  -patient tolerating limited full liquids  Earnstine Regal, MD, [facility] Office: [place] 09/16/2014

## 2014-09-16 NOTE — Progress Notes (Signed)
Progress Note   CANDACE BEGUE ZOX:[id number] DOB: [birth year] DOA: 08/25/2014 PCP: Delphina Cahill, MD   Brief Narrative:   CAROLYNE WHITSEL is an 59 y.o. female with a PMH of rectal cancer with liver metastasis, s/p rectal stent placement in 06/2014, on chemotherapy (last cycle completed 08/16/2014) under Dr. Gearldine Shown care who was admitted 08/25/2014 with ongoing nausea, vomiting and abdominal pain secondary to small bowel obstruction.She underwent exploratory laparotomy on 08/31/2014 with lower anterior rectosigmoid resection with colostomy. She is also s/p appendectomy, hysterectomy, and salpingoophorectomy, and debulking of peritoneum. Postoperatively she developed ileus requiring nutritional support with TPN. Hospital course was complicated by the development of sepsis due to possible pneumonia, treated with flagyl and Primaxin. All antibiotics stopped 09/06/2014.  Assessment/Plan:   Principal Problem: Small bowel obstruction / Nausea, vomiting and abdominal pain / Postoperative ileus  - CT abdomen on 08/26/2014 showed high-grade distal small bowel obstruction, likely from peritoneal metastatic disease.  - Status post exploratory laparotomy 08/31/2014 with lower anterior rectosigmoid resection with colostomy.  - She is also s/p appendectomy, hysterectomy, and salpingoophorectomy, and debulking of peritoneum by Dr. Michael Boston. - Post operatively she developed ileus. CT scan done 09/15/14: No significant findings to explain ongoing nausea other than ileus. - Continue TPN for nutritional support and FL diet as tolerated.  - Continue when necessary hydroxyzine, Ativan, Reglan, Zofran.   Active Problems: Sepsis secondary to possible PNA - CXR on 09/04/2014 showed right lung base opacity concerning for pneumonia. - Status post treatment with primaxin and flagyl. - No reports of fevers. White blood cell count is within normal limits.  Anemia of chronic disease  - Likely secondary to history of  malignancy.  - Patient has received total 3 units of PRBC transfusion during this hospital stay.  Rectal ca with liver mets - Last chemotherapy back in 07/2014. - Outpt follow up with Dr. Benay Spice.   Chest pain and Aflutter with RVR - 2 D ECHO with preserved EF. - Cardiology has seen the pt in consultation; last note 6/7/206. - A flutter occurred in the setting of multiple medical issues including hypokalemia, resolving SBO, metastatic rectal cancer. - Per cardiology, if recurrent sustained episodes, can treat with IV metoprolol for rate control.  Thrombocytopenia - Secondary to history of malignancy. - Platelet count normalized.   Deep tissue pressure injury - Evaluated by wound care nurse.  -.Patient has deep tissue pressure injury in evolution on the left side of gluteal cleft. - Continue wound care per recommendations.  Tobacco abuse - Continue nicotine patch as tolerated.  Severe protein calorie malnutrition / Failure to thrive / Underweight  - In the context of chronic illness, malignancy - Continue TPN for nutritional support.  - Body mass index is 16.96 kg/(m^2).  DVT Prophylaxis  - SCD's bilaterally due to thrombocytopenia    Code Status: Full.  Family Communication: No family at the bedside. Disposition Plan: Home once her nutrition & nausea improves.    IV Access:    CVC triple lumen placed in left internal jugular 08/31/14  Port-A-Cath left chest   Procedures and diagnostic studies:   Ct Abdomen Pelvis Wo Contrast  08/26/2014   CLINICAL DATA:  Nausea and vomiting. Left lower quadrant pain for 3 days.  EXAM: CT ABDOMEN AND PELVIS WITHOUT CONTRAST  TECHNIQUE: Multidetector CT imaging of the abdomen and pelvis was performed following the standard protocol without IV contrast.  COMPARISON:  07/23/2014  FINDINGS: BODY WALL: No contributory findings.  LOWER CHEST: No  acute findings  ABDOMEN/PELVIS:  Liver: Multi focal hepatic metastatic disease with faint  mineralization. The masses appear decreased from comparison study, especially the dominant left lobe mass which now measures 6 cm (previously 9 cm).  Biliary: No evidence of biliary obstruction or stone.  Pancreas: Unremarkable.  Spleen: Unremarkable.  Adrenals: Unremarkable.  Kidneys and ureters: No hydronephrosis. Punctate left nephrolithiasis  Bladder: Unremarkable.  Bowel: There is a stably positioned stent in the sigmoid colon to upper rectum with unchanged surrounding bowel wall thickening and erosion of the poximal stent into the wall, likely reaching the serosa. Formed stool present throughout the colon. High-density material is seen within the noninflamed appendix. Major change is diffuse dilation of small bowel with fluid levels, with transition point likely in the pelvis where there is peritoneal metastatic disease. No evidence of bowel necrosis or perforation  Peritoneum: Diffuse peritoneal metastases coating in the pelvic peritoneal recesses. Small ascites.  Vascular: No acute abnormality.  OSSEOUS: No acute abnormalities.  IMPRESSION: 1. 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 2. Unchanged positioning of the distal colonic stent, including erosion into the colonic wall. Large volume of desiccated stool present above the stent. 3. Extensive hepatic metastatic disease. The largest metastasis in the left lobe has decreased from 07/23/2014.   Electronically Signed   By: Monte Fantasia M.D.   On: 08/26/2014 02:00   Dg Abd 1 View  08/26/2014   CLINICAL DATA:  Metastatic colon cancer. Followup bowel obstruction.  EXAM: ABDOMEN - 1 VIEW  COMPARISON:  08/25/2014.  FINDINGS: Position of the distal colonic stent is unchanged from 08/24/2014. Again noted are multiple dilated loops of small bowel consistent with bowel obstruction. The degree of bowel distention is not significantly improved from previous exam.  IMPRESSION: 1. No change in small bowel obstruction  pattern.   Electronically Signed   By: Kerby Moors M.D.   On: 08/26/2014 09:22   Ct Abdomen Pelvis W Contrast  09/15/2014   CLINICAL DATA:  59 year old female with metastatic rectal cancer status post recent extensive abdominal surgery including resection of omental metastases, ovarian metastases, supracervical hysterectomy, multifocal small and large bowel resection and reanastomosis, and descending colostomy. Persistent nausea and vomiting. Subsequent encounter.  EXAM: CT ABDOMEN AND PELVIS WITH CONTRAST  TECHNIQUE: Multidetector CT imaging of the abdomen and pelvis was performed using the standard protocol following bolus administration of intravenous contrast.  CONTRAST:  16mL OMNIPAQUE IOHEXOL 300 MG/ML SOLN, 120mL OMNIPAQUE IOHEXOL 300 MG/ML SOLN  COMPARISON:  Acute abdominal series 08/30/14 and earlier. Preoperative CT Abdomen and Pelvis 08/25/2014  FINDINGS: New since the prior CT right lower lobe tree-in-bud and irregular peri-bronchovascular opacity (series 4, image 22). No pleural or pericardial effusion. Elsewhere the lung bases appear stable.  No acute osseous abnormality identified.  Interval distal large bowel resection along with the uterus and ovaries. Blind ending rectal stump with adjacent and right lower quadrant percutaneous postoperative drain. Presacral mild presacral stranding and trace fluid. No definite residual tumor identified in the pelvis.  Small volume of gas in the urinary bladder (series 2, image 83). No bladder catheter at this time.  There is also DN decubitus subcutaneous stranding which is new around the coccyx greater on the left of midline (series 2, image 81). No bony erosion identified.  Left lower abdomen descending colostomy with no adverse features. Upstream there is a mixture of dilute contrast and retained stool. There are 2 bowel anastomoses in the right abdomen with somewhat patulous appearing but contrast  opacified intervening colon. Stomach and duodenum appear  within normal limits. No dilated small bowel. There are several pockets of fluid in the distal small bowel mesentery (arrows on series 2, image 74). These are not definitely rim enhancing.  Metastatic disease to the liver re- identified with the largest liver metastases measuring 5-6 cm diameter. When compared to the noncontrast recent comparison no significant changes demonstrated.  Gallbladder, spleen, pancreas and adrenal glands remain within normal limits. Kidneys are within normal limits. Distal ureters are included on delayed excretory phase images and appear normal.  No pneumoperitoneum. No free fluid in the upper abdomen. Major arterial structures in the abdomen and pelvis appear patent. Aortoiliac calcified atherosclerosis noted. The portal venous system appears patent. No lymphadenopathy identified.  Ventral abdominal wound with no adverse features.  IMPRESSION: 1. No bowel obstruction suspected status post multifocal small and large bowel resection with both primary reanastomoses and descending colostomy. Patulous appearance of colon in the right abdomen might reflect focal ileus. 2. Pockets of fluid in the distal small bowel mesentery are nonspecific. Trace presacral fluid about the right abdominal approach percutaneous postoperative drain. 3. Stable fairly extensive liver metastases. 4. New right lower lobe tree-in-bud and peri-bronchovascular opacity indicative of bronchopneumonia. No pleural effusion. Study discussed by telephone with Dr. Johney Maine On 09/15/2014 at 2241 hrs.   Electronically Signed   By: Genevie Ann M.D.   On: 09/15/2014 20:52   Dg Chest Port 1 View  09/04/2014   CLINICAL DATA:  Sepsis.  Fever, weakness and cough  EXAM: PORTABLE CHEST - 1 VIEW  COMPARISON:  09/02/2014  FINDINGS: There is a left subclavian catheter with tip in the projection of the SVC. Left IJ catheter is also noted with tip in the SVC. The nasogastric tube is in the stomach. There is no pleural effusion identified. New  opacity is identified in the right base. Left lung appears clear.  IMPRESSION: 1. New right lung base opacity.   Electronically Signed   By: Kerby Moors M.D.   On: 09/04/2014 15:16   Dg Chest Port 1 View  09/02/2014   CLINICAL DATA:  Generalized body aches.  Weakness.  Leukocytosis.  EXAM: PORTABLE CHEST - 1 VIEW  COMPARISON:  08/31/2014  FINDINGS: Support lines and tubes in appropriate position. Heart size is normal. Both lungs remain clear. No evidence of pleural effusion or pneumothorax.  IMPRESSION: No active disease.   Electronically Signed   By: Earle Gell M.D.   On: 09/02/2014 17:31   Dg Chest Port 1 View  08/31/2014   CLINICAL DATA:  Central line placement.  Initial encounter.  EXAM: PORTABLE CHEST - 1 VIEW  COMPARISON:  Chest radiograph performed 08/30/2014  FINDINGS: A left IJ line is noted ending about the distal SVC. A left-sided chest port is noted ending about the mid to distal SVC. The patient's enteric tube is seen extending below the diaphragm.  Pulmonary vascularity is at the upper limits of normal. Minimal bibasilar atelectasis is noted. No pleural effusion or pneumothorax is seen.  The cardiomediastinal silhouette is normal in size. No acute osseous abnormalities are identified.  IMPRESSION: 1. Left IJ line noted ending about the distal SVC. 2. Minimal bibasilar atelectasis noted.  Lungs otherwise clear.   Electronically Signed   By: Garald Balding M.D.   On: 08/31/2014 21:54   Dg Chest Port 1 View  08/28/2014   CLINICAL DATA:  Chest pain, tachycardia, history of colon cancer  EXAM: PORTABLE CHEST - 1 VIEW  COMPARISON:  08/24/2014  FINDINGS: Lungs are clear.  No pleural effusion or pneumothorax.  The heart is normal in size.  Left chest port terminates at the cavoatrial junction.  IMPRESSION: No evidence of acute cardiopulmonary disease.   Electronically Signed   By: Julian Hy M.D.   On: 08/28/2014 19:54   Dg Abd 2 Views  08/29/2014   CLINICAL DATA:  Recent removal  nasogastric tube for small bowel obstruction. Persistent nausea and vomiting.  EXAM: ABDOMEN - 2 VIEW  COMPARISON:  Abdominal radiographs 08/28/2014 and 08/26/2014. Acute abdominal series 08/24/2014.  FINDINGS: Prior Port-A-Cath projects over the left chest, with the distal tip projecting over the distal superior vena cava. The lung bases are clear. Heart size is normal.  There are scattered air-fluid levels within small bowel and colon on the upright view. No evidence of pneumoperitoneum. Air-fluid level is noted in the gastric fundus.  Gaseous distention of a central small bowel loop appears decreased compared to the radiographs of 08/28/2014. Small bowel now measures 2.5 cm (previously 3.8 cm in this region). Gas is seen within a bowel loop in the right lower quadrant, projecting over the right iliac bone and right sacrum; it is difficult to determine if this is dilated small bowel or colon. There is a moderate amount of stool within the colon; overall, colonic stool burden has decreased since 08/26/2014. A distal colonic stent is again noted.  IMPRESSION: Further improvement in small bowel distention since radiographs dated 08/28/2014.  Colonic stent present.  Decreasing stool burden.   Electronically Signed   By: Curlene Dolphin M.D.   On: 08/29/2014 11:27   Dg Abd 2 Views  08/28/2014   CLINICAL DATA:  Abdominal discomfort and distension. Small bowel obstruction.  EXAM: ABDOMEN - 2 VIEW  COMPARISON:  08/26/2014  FINDINGS: Stable position of the colonic stent in the mid pelvis. Slight improvement in the small bowel distension compared to 08/26/2014. NG tube in the proximal stomach. Minimal change in the residual colonic stool burden. No acute osseous finding.  IMPRESSION: Slight improvement in small bowel distension compatible with resolving obstruction.   Electronically Signed   By: Jerilynn Mages.  Shick M.D.   On: 08/28/2014 08:37   Dg Abd Acute W/chest  08/30/2014   CLINICAL DATA:  Follow-up small bowel obstruction.  Intermittent abdominal pain with nausea and vomiting.  EXAM: DG ABDOMEN ACUTE W/ 1V CHEST  COMPARISON:  08/29/2014  FINDINGS: Large stool burden throughout the colon. Continued mildly prominent mid abdominal small bowel loops with scattered air-fluid levels. No free air organomegaly. Presumed colonic stent in the pelvis.  Left Port-A-Cath remains in place, unchanged. Heart is normal size. Mild hyperinflation of the lungs without focal opacity or effusion.  IMPRESSION: Continued mild prominence of mid abdominal small bowel loops with scattered air-fluid levels. Large stool burden throughout the colon. No real change since prior study.   Electronically Signed   By: Rolm Baptise M.D.   On: 08/30/2014 10:34   Dg Abd Acute W/chest  08/24/2014   CLINICAL DATA:  Chemotherapy for colon cancer. Dehydration. Diffuse pain.  EXAM: DG ABDOMEN ACUTE W/ 1V CHEST  COMPARISON:  Abdominal CT 07/23/2014  FINDINGS: Chronic retention of stool above a sigmoid colon metallic stent. No evidence of high-grade obstruction or change from previous. No concerning intra-abdominal mass effect or calcification. Lung bases are clear.  IMPRESSION: Chronic retention of stool above of the patient's sigmoid colon stent. The stool appears desiccated and is likely resistant to passing through the stent. No high-grade obstruction  or progression since 07/23/2014.   Electronically Signed   By: Monte Fantasia M.D.   On: 08/24/2014 06:51     Medical Consultants:    Surgery  Cardiology  Infectious Disease  Anti-Infectives:    Primaxin 08/25/14 -->09/06/14  Flagyl 08/25/14 --> 08/29/14  Subjective:   Sanjuan Dame continues to report ongoing nausea.  No vomiting.  Has small amount of fecal matter in colostomy, but is passing a lot of gas in the bag.  Feels hungry at times.  No shortness of breath or cough.  Objective:    Filed Vitals:   09/15/14 0602 09/15/14 1500 09/15/14 2204 09/16/14 0458  BP: 120/55 124/57 119/56 114/44  Pulse:  71 77 62 68  Temp: 99.1 F (37.3 C) 98.7 F (37.1 C) 98.3 F (36.8 C) 98.7 F (37.1 C)  TempSrc: Oral Oral Oral Oral  Resp: 18 20 18 18   Height:      Weight: 49.125 kg (108 lb 4.8 oz)   47.265 kg (104 lb 3.2 oz)  SpO2: 99% 100% 100% 100%    Intake/Output Summary (Last 24 hours) at 09/16/14 0853 Last data filed at 09/16/14 0612  Gross per 24 hour  Intake 500.66 ml  Output    105 ml  Net 395.66 ml    Exam: Gen:  NAD, anxious, thin Cardiovascular:  RRR, No M/R/G Respiratory:  Lungs CTAB Gastrointestinal:  Abdomen soft, NT/ND, + BS Extremities:  No C/E/C   Data Reviewed:    Labs: Basic Metabolic Panel:  Recent Labs Lab 09/10/14 0410 09/11/14 0400 09/14/14 0543 09/15/14 0355  NA 138 136 136 136  K 3.8 3.8 3.5 3.7  CL 104 102 107 103  CO2 27 26 25 26   GLUCOSE 124* 115* 106* 113*  BUN 13 13 14 14   CREATININE 0.39* 0.41* 0.37* 0.36*  CALCIUM 8.1* 8.2* 8.0* 8.3*  MG 1.8 1.9 1.8  --   PHOS  --  4.2 3.9  --    GFR Estimated Creatinine Clearance: 57.9 mL/min (by C-G formula based on Cr of 0.36). Liver Function Tests:  Recent Labs Lab 09/11/14 0400 09/14/14 0543  AST 19 14*  ALT 13* 11*  ALKPHOS 127* 119  BILITOT 0.3 0.4  PROT 5.7* 5.9*  ALBUMIN 2.1* 2.2*   CBC:  Recent Labs Lab 09/11/14 0400 09/14/14 0543 09/15/14 0355  WBC 7.8 7.0 7.0  NEUTROABS 5.3  --   --   HGB 7.5* 7.6* 7.7*  HCT 23.9* 25.1* 25.1*  MCV 88.2 87.5 88.1  PLT 190 215 217   CBG:  Recent Labs Lab 09/15/14 0557 09/15/14 1233 09/15/14 1736 09/15/14 2248 09/16/14 0448  GLUCAP 123* 101* 96 96 118*    Microbiology No results found for this or any previous visit (from the past 240 hour(s)).   Medications:   . acetaminophen  1,000 mg Oral TID  . feeding supplement (ENSURE ENLIVE)  237 mL Oral BID BM  . insulin aspart  0-15 Units Subcutaneous 4 times per day  . nicotine  21 mg Transdermal Daily  . ondansetron  8 mg Oral TID AC & HS  . sodium chloride  10-40 mL  Intracatheter Q12H  . sodium chloride  3 mL Intravenous Q12H   Continuous Infusions: . sodium chloride 75 mL/hr at 09/15/14 0633  . Marland KitchenTPN (CLINIMIX-E) Adult 60 mL/hr at 09/15/14 1814   And  . fat emulsion 240 mL (09/15/14 1814)    Time spent: 25 minutes.   LOS: 22 days   Laaibah Wartman  [facility] Pager [phone]. If unable to reach me by pager, please call my cell phone at [phone].  *Please refer to [email or web address], password [email or web address] to get updated schedule on who will round on this patient, as hospitalists switch teams weekly. If 7PM-7AM, please contact night-coverage at [email or web address], password [email or web address] for any overnight needs.  09/16/2014, 8:53 AM

## 2014-09-16 NOTE — Progress Notes (Signed)
PARENTERAL NUTRITION CONSULT NOTE - Follow-up  Pharmacy Consult for TPN Indication: Persistent SBO  Allergies  Allergen Reactions  . Avelox [Moxifloxacin Hcl In Nacl] Hives  . Codeine Hives  . Dextromethorphan Hives  . Erythromycin Hives  . Penicillins Hives    No problem with Imipenem  . Suprep [Na Sulfate-K Sulfate-Mg Sulf] Nausea And Vomiting    Patient Measurements: Height: 5' 7"  (170.2 cm) Weight: 104 lb 3.2 oz (47.265 kg) IBW/kg (Calculated) : 61.6 Usual Weight: 50.4 kg  Vital Signs: Temp: 98.7 F (37.1 C) (06/18 0458) Temp Source: Oral (06/18 0458) BP: 114/44 mmHg (06/18 0458) Pulse Rate: 68 (06/18 0458) Intake/Output from previous day: 06/17 0701 - 06/18 0700 In: 500.7 [I.V.:40; TPN:460.7] Out: 110 [Drains:10; Stool:100]  Labs:  Recent Labs  09/14/14 0543 09/15/14 0355  WBC 7.0 7.0  HGB 7.6* 7.7*  HCT 25.1* 25.1*  PLT 215 217     Recent Labs  09/14/14 0543 09/15/14 0355  NA 136 136  K 3.5 3.7  CL 107 103  CO2 25 26  GLUCOSE 106* 113*  BUN 14 14  CREATININE 0.37* 0.36*  CALCIUM 8.0* 8.3*  MG 1.8  --   PHOS 3.9  --   PROT 5.9*  --   ALBUMIN 2.2*  --   AST 14*  --   ALT 11*  --   ALKPHOS 119  --   BILITOT 0.4  --    Estimated Creatinine Clearance: 57.9 mL/min (by C-G formula based on Cr of 0.36).    Recent Labs  09/15/14 1736 09/15/14 2248 09/16/14 0448  GLUCAP 96 96 118*   Medications, infusions:  . sodium chloride 75 mL/hr at 09/15/14 0633  . Marland KitchenTPN (CLINIMIX-E) Adult 60 mL/hr at 09/15/14 1814   And  . fat emulsion 240 mL (09/15/14 1814)   Insulin Requirements: Moderate SSI q6h: 2 units / 24 hours.  Current Nutrition: clear liquid diet w/ Ensure ordered BID (pt refused taking for the past 2 days). Continues to have nausea  IVF: NS at 75 ml/hr  Central access: CVC triple lumen, port L chest TPN start date: 6/1  ASSESSMENT                                                                                                           HPI: 36yoF with PMH metastatic rectal cancer and rectal stent 06/2014, s/p 3 cycles chemo presents 5/27 with N/V x 1 day.  Found to have SBO, likely secondary to pelvic tumor on CT.  NG tube placed and later removed once SBO improved on Abd XR.  Started on CL diet, but persistent N/V prevent meaningful PO intake.  To start on TPN per pharmacy for nutritional supplementation until tolerating PO.  Significant events:  5/31: patient misunderstood Surgeon to imply that TPN was not going to start today and did not allow TPN to be initiated. 6/2: s/p exp laparotomy, SB resection, LAR, stent, colostomy, appendectomy, hysterectomy, salpingo-oophorectomy, transverse colon resection, debulking of peritoneum. 6/6: clamp NG tube 6/7: NGT d/c'ed 6/9: started CLD 6/12: small amount  of stool in ostomy, + flatus 6/14: low iron - feraheme 510 mg given 6/18: repeat CT:  No bowel obstruction, Pockets of fluid in the distal small bowel mesentery are nonspecific.   Today:   Glucose: at goal (< 150), no Hx DM  Electrolytes: wnl except K borderline low  Renal: SCr low, stable  LFTs: AST WNL, ALT low, Alk Phos and Tbili WNL (6/16)  TGs: elevated at baseline at 226 (6/1), now wnl at 116  Prealbumin: 13.2 (6/1), 6 (6/6), 9.8 (6/13)  NUTRITIONAL GOALS                                                                                             RD recs: Nutrition needs: 1600-1800 kcal, 70-80 grams protein.  Clinimix 5/20 @ 60 mL/hr + 20% IV fat emulsion at 10 ml/hr to provide 72 grams protein and 1746 kcal   Clinimix E5/15 (1728m) + 20% IV fat emulsion (2174m to provide 85gm protein and 1675 kcals  PLAN                                                                                                                         2) At 1800 today:  Becoming more likely will go home on TPN, will start 18-hr cycle tonight in preparation. Change Clinimix product to Clinimix E 5/15 to keep glucose infusion rate  <19m4mg/min. Infuse 1700m30mh.    Change 20% IV fat emulsion at 13 ml/hr over 18h.  TPN to contain standard multivitamins and trace elements.  Reduce MIVF rate to 50 ml/hr.    Continue CBGs and moderate SSI q6h.  TPN lab panels on Mondays & Thursdays.  F/u ability to advance diet, then ability to tolerate  DustDoreene ElandarmD, BCPS.   Pager: [phone]/2016 10:28 AM

## 2014-09-16 NOTE — Progress Notes (Signed)
JP drain removed. There were 2 sutures removed. Patient did not tolerate this very well.  Will continue to monitor the patient.

## 2014-09-17 DIAGNOSIS — K567 Ileus, unspecified: Secondary | ICD-10-CM | POA: Diagnosis present

## 2014-09-17 DIAGNOSIS — K9189 Other postprocedural complications and disorders of digestive system: Secondary | ICD-10-CM

## 2014-09-17 LAB — BASIC METABOLIC PANEL
Anion gap: 8 (ref 5–15)
BUN: 19 mg/dL (ref 6–20)
CO2: 26 mmol/L (ref 22–32)
Calcium: 8.5 mg/dL — ABNORMAL LOW (ref 8.9–10.3)
Chloride: 102 mmol/L (ref 101–111)
Creatinine, Ser: 0.37 mg/dL — ABNORMAL LOW (ref 0.44–1.00)
GFR calc Af Amer: >60 mL/min (ref >60)
Glucose, Bld: 114 mg/dL — ABNORMAL HIGH (ref 65–99)
Potassium: 4.1 mmol/L (ref 3.5–5.1)
Sodium: 136 mmol/L (ref 135–145)

## 2014-09-17 LAB — GLUCOSE, CAPILLARY
GLUCOSE-CAPILLARY: 110 mg/dL — AB (ref 65–99)
GLUCOSE-CAPILLARY: 109 mg/dL — AB (ref 65–99)
Glucose-Capillary: 98 mg/dL (ref 65–99)

## 2014-09-17 MED ORDER — HEPARIN SODIUM (PORCINE) 5000 UNIT/ML IJ SOLN
5000.0000 [IU] | Freq: Three times a day (TID) | INTRAMUSCULAR | Status: DC
  Administered 2014-09-17 – 2014-09-21 (×13): 5000 [IU] via SUBCUTANEOUS
  Filled 2014-09-17 (×13): qty 1

## 2014-09-17 MED ORDER — FAT EMULSION 20 % IV EMUL
234.0000 mL | INTRAVENOUS | Status: AC
  Administered 2014-09-17: 234 mL via INTRAVENOUS
  Filled 2014-09-17: qty 250

## 2014-09-17 MED ORDER — INSULIN ASPART 100 UNIT/ML ~~LOC~~ SOLN
0.0000 [IU] | Freq: Four times a day (QID) | SUBCUTANEOUS | Status: DC
  Administered 2014-09-18: 2 [IU] via SUBCUTANEOUS

## 2014-09-17 MED ORDER — SODIUM CHLORIDE 0.9 % IV SOLN
INTRAVENOUS | Status: DC
  Administered 2014-09-18: 06:00:00 via INTRAVENOUS

## 2014-09-17 MED ORDER — TRACE MINERALS CR-CU-MN-SE-ZN 10-1000-500-60 MCG/ML IV SOLN
INTRAVENOUS | Status: AC
  Administered 2014-09-17: 18:00:00 via INTRAVENOUS
  Filled 2014-09-17: qty 1700

## 2014-09-17 NOTE — Progress Notes (Signed)
PARENTERAL NUTRITION CONSULT NOTE - Follow-up  Pharmacy Consult for TPN Indication: Persistent SBO  Allergies  Allergen Reactions  . Avelox [Moxifloxacin Hcl In Nacl] Hives  . Codeine Hives  . Dextromethorphan Hives  . Erythromycin Hives  . Penicillins Hives    No problem with Imipenem  . Suprep [Na Sulfate-K Sulfate-Mg Sulf] Nausea And Vomiting    Patient Measurements: Height: 5' 7"  (170.2 cm) Weight: 103 lb 4.6 oz (46.85 kg) IBW/kg (Calculated) : 61.6 Usual Weight: 50.4 kg  Vital Signs: Temp: 98 F (36.7 C) (06/19 0617) Temp Source: Oral (06/19 0617) BP: 108/50 mmHg (06/19 0617) Pulse Rate: 68 (06/19 0617) Intake/Output from previous day: 06/18 0701 - 06/19 0700 In: 2728.5 [P.O.:120; I.V.:553.3; TPN:2055.2] Out: 1100 [Urine:1100]  Labs:  Recent Labs  09/15/14 0355  WBC 7.0  HGB 7.7*  HCT 25.1*  PLT 217     Recent Labs  09/15/14 0355 09/17/14 0400  NA 136 136  K 3.7 4.1  CL 103 102  CO2 26 26  GLUCOSE 113* 114*  BUN 14 19  CREATININE 0.36* 0.37*  CALCIUM 8.3* 8.5*   Estimated Creatinine Clearance: 57.4 mL/min (by C-G formula based on Cr of 0.37).    Recent Labs  09/16/14 1732 09/16/14 2338 09/17/14 0615  GLUCAP 108* 121* 110*   Medications, infusions:  . sodium chloride 50 mL/hr at 09/17/14 0403  . Marland KitchenTPN (CLINIMIX-E) Adult 100 mL/hr at 09/16/14 1925   And  . fat emulsion 234 mL (09/16/14 1820)   Insulin Requirements: Moderate SSI q6h: 2 units / 24 hours.  Current Nutrition: full liquids as tolerates. Continues to have nausea  IVF: NS at 75 ml/hr  Central access: CVC triple lumen, port L chest TPN start date: 6/1  ASSESSMENT                                                                                                          HPI: 6yoF with PMH metastatic rectal cancer and rectal stent 06/2014, s/p 3 cycles chemo presents 5/27 with N/V x 1 day.  Found to have SBO, likely secondary to pelvic tumor on CT.  NG tube placed and later  removed once SBO improved on Abd XR.  Started on CL diet, but persistent N/V prevent meaningful PO intake.  To start on TPN per pharmacy for nutritional supplementation until tolerating PO.  Significant events:  5/31: patient misunderstood Surgeon to imply that TPN was not going to start today and did not allow TPN to be initiated. 6/2: s/p exp laparotomy, SB resection, LAR, stent, colostomy, appendectomy, hysterectomy, salpingo-oophorectomy, transverse colon resection, debulking of peritoneum. 6/6: clamp NG tube 6/7: NGT d/c'ed 6/9: started CLD 6/12: small amount of stool in ostomy, + flatus 6/14: low iron - feraheme 510 mg given 6/17: repeat CT:  No bowel obstruction, Pockets of fluid in the distal small bowel mesentery are nonspecific.  6/18: start 18h TPN cycle  Today:   Glucose: at goal (< 150), no Hx DM  Electrolytes: wnl except K borderline low  Renal: SCr low, stable  LFTs: AST  WNL, ALT low, Alk Phos and Tbili WNL (6/16)  TGs: elevated at baseline at 226 (6/1), now wnl at 116  Prealbumin: 13.2 (6/1), 6 (6/6), 9.8 (6/13)  Patient weight:   NUTRITIONAL GOALS                                                                                             RD recs: Nutrition needs: 1600-1800 kcal, 70-80 grams protein.  Clinimix 5/20 @ 60 mL/hr + 20% IV fat emulsion at 10 ml/hr to provide 72 grams protein and 1746 kcal   Starting 6/19: 12h cycle Clinimix E5/15 (1748m) + 20% IV fat emulsion (2325m to provide 86gm protein and 1685 kcals  PLAN                                                                                                                         2) At 1800 today:  Per notes, possible may go home on TPN,  Change to 12hr cycle tonight in preparation. Changed Clinimix product to Clinimix E 5/15 to reduce glucose infusion rate. Infuse 170013m4h.    Change 20% IV fat emulsion at 19.5 ml/hr over 12h.  TPN to contain standard multivitamins and trace elements.  MIVF  50 ml/hr to infuse while off TPN cycle - watch I/O, weight  Continue CBGs and moderate SSI q6h - adjust timing to coincide with 12h cycle.  Change to q8h if tolerates 12j cycle  TPN lab panels on Mondays & Thursdays.  F/u ability to advance diet, then ability to tolerate  DusDoreene ElandharmD, BCPS.   Pager: [phone] 9:00 AM

## 2014-09-17 NOTE — Progress Notes (Signed)
General Surgery Note  LOS: 23 days  POD -  17 Days Post-Op  Assessment/Plan: 1.  EXPLORATORY LAPAROTOMY, SMALL BOWEL RESECTION, low anterior resection with COLOSTOMY, APPENDECTOMY, HYSTERECTOMY SUPRACERVICAL ABDOMINAL BILATERAL TUBES AND OVARIES, TRANSVERSE COLON RESECTION, DEBULKING OF PERITONEUM - 09/06/2014 - S. Gross  For Small Bowel Obstruction, Rectosigmoid cancer with perforation, Pelvic carcinomatosis, Omental central caking/carcinoimatosis, Liver metatasis  [Note:  Update note by Dr. Johney Maine on 09/15/2014]  Doing well from major surgery except for nausea.  Unclear why she is having the problem.  She continues to have small amounts of stool in her colostomy and passing flatus in the colostomy.  Continue current course for now.  She is ambulating.  2.  Has liver mets  Sees Dr. Benay Spice for oncology 3.  Post op ileus  CT scan - 09/15/2014 - showed no obvious cause of nausea 4.  Malnutrition   On TPN  Should get labs in AM 5.  Anemia -   Hgb - 7.7 - 09/15/2014 6.  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  Some pain in her RLQ.  But otherwise doing okay.  In room by self. Objective:   Filed Vitals:   09/17/14 0617  BP: 108/50  Pulse: 68  Temp: 98 F (36.7 C)  Resp: 18     Intake/Output from previous day:  06/18 0701 - 06/19 0700 In: 2728.5 [P.O.:120; I.V.:553.3; TPN:2055.2] Out: 1100 [Urine:1100]  Intake/Output this shift:      Physical Exam:   General: Thin WF who is alert and  oriented.    HEENT: Normal. Pupils equal. .   Lungs: Clear.   Abdomen: Soft.  LLQ ostomy with small amount of liquid stool.   Wound: Clean.   Lab Results:    Recent Labs  09/15/14 0355  WBC 7.0  HGB 7.7*  HCT 25.1*  PLT 217    BMET   Recent Labs  09/15/14 0355 09/17/14 0400  NA 136 136  K 3.7 4.1  CL 103 102  CO2 26 26  GLUCOSE 113* 114*  BUN 14 19  CREATININE 0.36* 0.37*  CALCIUM 8.3* 8.5*    PT/INR  No results for input(s): LABPROT, INR in the last 72 hours.  ABG  No results for input(s): PHART, HCO3 in the last 72 hours.  Invalid input(s): PCO2, PO2   Studies/Results:  Ct Abdomen Pelvis W Contrast  09/15/2014   CLINICAL DATA:  59 year old female with metastatic rectal cancer status post recent extensive abdominal surgery including resection of omental metastases, ovarian metastases, supracervical hysterectomy, multifocal small and large bowel resection and reanastomosis, and descending colostomy. Persistent nausea and vomiting. Subsequent encounter.  EXAM: CT ABDOMEN AND PELVIS WITH CONTRAST  TECHNIQUE: Multidetector CT imaging of the abdomen and pelvis was performed using the standard protocol following bolus administration of intravenous contrast.  CONTRAST:  63mL OMNIPAQUE IOHEXOL 300 MG/ML SOLN, 123mL OMNIPAQUE IOHEXOL 300 MG/ML SOLN  COMPARISON:  Acute abdominal series 08/30/14 and earlier. Preoperative CT Abdomen and Pelvis 08/25/2014  FINDINGS: New since the prior CT right lower lobe tree-in-bud and irregular peri-bronchovascular opacity (series 4, image 22). No pleural or pericardial effusion. Elsewhere the lung bases appear stable.  No acute osseous abnormality identified.  Interval distal large bowel resection along with the uterus and ovaries. Blind ending rectal stump with adjacent and right lower quadrant percutaneous postoperative drain. Presacral mild presacral stranding and trace fluid. No definite residual tumor identified in the pelvis.  Small volume of gas  in the urinary bladder (series 2, image 83). No bladder catheter at this time.  There is also DN decubitus subcutaneous stranding which is new around the coccyx greater on the left of midline (series 2, image 81). No bony erosion identified.  Left lower abdomen descending colostomy with no adverse features. Upstream there is a mixture of dilute contrast and retained stool. There are 2 bowel anastomoses in the right abdomen with somewhat patulous appearing but contrast opacified intervening colon. Stomach and duodenum appear within normal limits. No dilated small bowel. There are several pockets of fluid in the distal small bowel mesentery (arrows on series 2, image 74). These are not definitely rim enhancing.  Metastatic disease to the liver re- identified with the largest liver metastases measuring 5-6 cm diameter. When compared to the noncontrast recent comparison no significant changes demonstrated.  Gallbladder, spleen, pancreas and adrenal glands remain within normal limits. Kidneys are within normal limits. Distal ureters are included on delayed excretory phase images and appear normal.  No pneumoperitoneum. No free fluid in the upper abdomen. Major arterial structures in the abdomen and pelvis appear patent. Aortoiliac calcified atherosclerosis noted. The portal venous system appears patent. No lymphadenopathy identified.  Ventral abdominal wound with no adverse features.  IMPRESSION: 1. No bowel obstruction suspected status post multifocal small and large bowel resection with both primary reanastomoses and descending colostomy. Patulous appearance of colon in the right abdomen might reflect focal ileus. 2. Pockets of fluid in the distal small bowel mesentery are nonspecific. Trace presacral fluid about the right abdominal approach percutaneous postoperative drain. 3. Stable fairly extensive liver metastases. 4. New right lower lobe tree-in-bud and peri-bronchovascular opacity indicative of bronchopneumonia.  No pleural effusion. Study discussed by telephone with Dr. Johney Maine On 09/15/2014 at 2241 hrs.   Electronically Signed   By: Genevie Ann M.D.   On: 09/15/2014 20:52     Anti-infectives:   Anti-infectives    Start     Dose/Rate Route Frequency Ordered Stop   09/01/14 1200  imipenem-cilastatin (PRIMAXIN) 250 mg in sodium chloride 0.9 % 100 mL IVPB  Status:  Discontinued     250 mg 200 mL/hr over 30 Minutes Intravenous 4 times per day 09/01/14 1051 09/06/14 1759   08/31/14 1030  clindamycin (CLEOCIN) 900 mg, gentamicin (GARAMYCIN) 240 mg in sodium chloride 0.9 % 1,000 mL for intraperitoneal lavage  Status:  Discontinued      Intraperitoneal To Surgery 08/31/14 1023 08/31/14 1513   08/31/14 0600  clindamycin (CLEOCIN) IVPB 900 mg     900 mg 100 mL/hr over 30 Minutes Intravenous 60 min pre-op 08/30/14 1218 08/31/14 0944   08/31/14 0600  [MAR Hold]  gentamicin (GARAMYCIN) 220 mg in dextrose 5 % 100 mL IVPB     (MAR Hold since 08/31/14 1010)   5 mg/kg  44.1 kg 105.5 mL/hr over 60 Minutes Intravenous 60 min pre-op 08/30/14 1218 08/31/14 1005   08/30/14 2100  imipenem-cilastatin (PRIMAXIN) 250 mg in sodium chloride 0.9 % 100 mL IVPB  Status:  Discontinued  250 mg 200 mL/hr over 30 Minutes Intravenous 3 times per day 08/30/14 1739 09/01/14 1051   08/25/14 1800  imipenem-cilastatin (PRIMAXIN) 250 mg in sodium chloride 0.9 % 100 mL IVPB  Status:  Discontinued     250 mg 200 mL/hr over 30 Minutes Intravenous Every 6 hours 08/25/14 1716 08/30/14 1739   08/25/14 1800  metroNIDAZOLE (FLAGYL) IVPB 500 mg  Status:  Discontinued     500 mg 100 mL/hr over 60 Minutes Intravenous Every 8 hours 08/25/14 1716 08/27/14 1003      Alphonsa Overall, MD, FACS Pager: [facility] Office: [phone] 09/17/2014

## 2014-09-17 NOTE — Progress Notes (Signed)
Progress Note   Sydney Morrison ZOX:[id number] DOB: 03/19/1956 DOA: 08/25/2014 PCP: Delphina Cahill, MD   Brief Narrative:   Sydney Morrison is an 59 y.o. female with a PMH of rectal cancer with liver metastasis, s/p rectal stent placement in 06/2014, on chemotherapy (last cycle completed 08/16/2014) under Dr. Gearldine Shown care who was admitted 08/25/2014 with ongoing nausea, vomiting and abdominal pain secondary to small bowel obstruction.She underwent exploratory laparotomy on 08/31/2014 with lower anterior rectosigmoid resection with colostomy. She is also s/p appendectomy, hysterectomy, and salpingoophorectomy, and debulking of peritoneum. Postoperatively she developed ileus requiring nutritional support with TPN. Hospital course was complicated by the development of sepsis due to possible pneumonia, treated with flagyl and Primaxin. All antibiotics stopped 09/06/2014.  Assessment/Plan:   Principal Problem: Small bowel obstruction / Nausea, vomiting and abdominal pain / Postoperative ileus  - CT abdomen on 08/26/2014 showed high-grade distal small bowel obstruction, likely from peritoneal metastatic disease.  - Status post exploratory laparotomy 08/31/2014 with lower anterior rectosigmoid resection with colostomy.  - She is also s/p appendectomy, hysterectomy, and salpingoophorectomy, and debulking of peritoneum by Dr. Michael Boston. - Post operatively she developed ileus. CT scan done 09/15/14: No significant findings to explain ongoing nausea other than ileus. - Continue TPN for nutritional support and FL diet as tolerated.  - Continue when necessary hydroxyzine, Ativan, Reglan, Zofran.   Active Problems: Sepsis secondary to possible PNA - CXR on 09/04/2014 showed right lung base opacity concerning for pneumonia. - Status post treatment with primaxin and flagyl. - No reports of fevers. White blood cell count is within normal limits.  Anemia of chronic disease  - Likely secondary to history of  malignancy.  - Patient has received total 3 units of PRBC transfusion during this hospital stay.  Rectal ca with liver mets - Last chemotherapy back in 07/2014. - Outpt follow up with Dr. Benay Spice.   Chest pain and Aflutter with RVR - 2 D ECHO with preserved EF. - Cardiology has seen the pt in consultation; last note 6/7/206. - A flutter occurred in the setting of multiple medical issues including hypokalemia, resolving SBO, metastatic rectal cancer. - Per cardiology, if recurrent sustained episodes, can treat with IV metoprolol for rate control.  Thrombocytopenia - Secondary to history of malignancy. - Platelet count normalized.   Deep tissue pressure injury - Evaluated by wound care nurse.  -.Patient has deep tissue pressure injury in evolution on the left side of gluteal cleft. - Continue wound care per recommendations.  Tobacco abuse - Continue nicotine patch as tolerated.  Severe protein calorie malnutrition / Failure to thrive / Underweight  - In the context of chronic illness, malignancy - Continue TPN for nutritional support.  - Body mass index is 16.96 kg/(m^2).  DVT Prophylaxis  - SCD's bilaterally due to thrombocytopenia    Code Status: Full.  Family Communication: No family at the bedside. I have given my business card to the patient with instructions on how to reach me if her family wishes to speak with me. Disposition Plan: Home once her nutrition & nausea improves.    IV Access:    CVC triple lumen placed in left internal jugular 08/31/14  Port-A-Cath left chest   Procedures and diagnostic studies:   Ct Abdomen Pelvis Wo Contrast  08/26/2014   CLINICAL DATA:  Nausea and vomiting. Left lower quadrant pain for 3 days.  EXAM: CT ABDOMEN AND PELVIS WITHOUT CONTRAST  TECHNIQUE: Multidetector CT imaging of the abdomen and pelvis  was performed following the standard protocol without IV contrast.  COMPARISON:  07/23/2014  FINDINGS: BODY WALL: No  contributory findings.  LOWER CHEST: No acute findings  ABDOMEN/PELVIS:  Liver: Multi focal hepatic metastatic disease with faint mineralization. The masses appear decreased from comparison study, especially the dominant left lobe mass which now measures 6 cm (previously 9 cm).  Biliary: No evidence of biliary obstruction or stone.  Pancreas: Unremarkable.  Spleen: Unremarkable.  Adrenals: Unremarkable.  Kidneys and ureters: No hydronephrosis. Punctate left nephrolithiasis  Bladder: Unremarkable.  Bowel: There is a stably positioned stent in the sigmoid colon to upper rectum with unchanged surrounding bowel wall thickening and erosion of the poximal stent into the wall, likely reaching the serosa. Formed stool present throughout the colon. High-density material is seen within the noninflamed appendix. Major change is diffuse dilation of small bowel with fluid levels, with transition point likely in the pelvis where there is peritoneal metastatic disease. No evidence of bowel necrosis or perforation  Peritoneum: Diffuse peritoneal metastases coating in the pelvic peritoneal recesses. Small ascites.  Vascular: No acute abnormality.  OSSEOUS: No acute abnormalities.  IMPRESSION: 1. 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 2. Unchanged positioning of the distal colonic stent, including erosion into the colonic wall. Large volume of desiccated stool present above the stent. 3. Extensive hepatic metastatic disease. The largest metastasis in the left lobe has decreased from 07/23/2014.   Electronically Signed   By: Monte Fantasia M.D.   On: 08/26/2014 02:00   Dg Abd 1 View  08/26/2014   CLINICAL DATA:  Metastatic colon cancer. Followup bowel obstruction.  EXAM: ABDOMEN - 1 VIEW  COMPARISON:  08/25/2014.  FINDINGS: Position of the distal colonic stent is unchanged from 08/24/2014. Again noted are multiple dilated loops of small bowel consistent with bowel obstruction. The  degree of bowel distention is not significantly improved from previous exam.  IMPRESSION: 1. No change in small bowel obstruction pattern.   Electronically Signed   By: Kerby Moors M.D.   On: 08/26/2014 09:22   Ct Abdomen Pelvis W Contrast  09/15/2014   CLINICAL DATA:  59 year old female with metastatic rectal cancer status post recent extensive abdominal surgery including resection of omental metastases, ovarian metastases, supracervical hysterectomy, multifocal small and large bowel resection and reanastomosis, and descending colostomy. Persistent nausea and vomiting. Subsequent encounter.  EXAM: CT ABDOMEN AND PELVIS WITH CONTRAST  TECHNIQUE: Multidetector CT imaging of the abdomen and pelvis was performed using the standard protocol following bolus administration of intravenous contrast.  CONTRAST:  73mL OMNIPAQUE IOHEXOL 300 MG/ML SOLN, 172mL OMNIPAQUE IOHEXOL 300 MG/ML SOLN  COMPARISON:  Acute abdominal series 08/30/14 and earlier. Preoperative CT Abdomen and Pelvis 08/25/2014  FINDINGS: New since the prior CT right lower lobe tree-in-bud and irregular peri-bronchovascular opacity (series 4, image 22). No pleural or pericardial effusion. Elsewhere the lung bases appear stable.  No acute osseous abnormality identified.  Interval distal large bowel resection along with the uterus and ovaries. Blind ending rectal stump with adjacent and right lower quadrant percutaneous postoperative drain. Presacral mild presacral stranding and trace fluid. No definite residual tumor identified in the pelvis.  Small volume of gas in the urinary bladder (series 2, image 83). No bladder catheter at this time.  There is also DN decubitus subcutaneous stranding which is new around the coccyx greater on the left of midline (series 2, image 81). No bony erosion identified.  Left lower abdomen descending colostomy with no adverse features. Upstream there  is a mixture of dilute contrast and retained stool. There are 2 bowel  anastomoses in the right abdomen with somewhat patulous appearing but contrast opacified intervening colon. Stomach and duodenum appear within normal limits. No dilated small bowel. There are several pockets of fluid in the distal small bowel mesentery (arrows on series 2, image 74). These are not definitely rim enhancing.  Metastatic disease to the liver re- identified with the largest liver metastases measuring 5-6 cm diameter. When compared to the noncontrast recent comparison no significant changes demonstrated.  Gallbladder, spleen, pancreas and adrenal glands remain within normal limits. Kidneys are within normal limits. Distal ureters are included on delayed excretory phase images and appear normal.  No pneumoperitoneum. No free fluid in the upper abdomen. Major arterial structures in the abdomen and pelvis appear patent. Aortoiliac calcified atherosclerosis noted. The portal venous system appears patent. No lymphadenopathy identified.  Ventral abdominal wound with no adverse features.  IMPRESSION: 1. No bowel obstruction suspected status post multifocal small and large bowel resection with both primary reanastomoses and descending colostomy. Patulous appearance of colon in the right abdomen might reflect focal ileus. 2. Pockets of fluid in the distal small bowel mesentery are nonspecific. Trace presacral fluid about the right abdominal approach percutaneous postoperative drain. 3. Stable fairly extensive liver metastases. 4. New right lower lobe tree-in-bud and peri-bronchovascular opacity indicative of bronchopneumonia. No pleural effusion. Study discussed by telephone with Dr. Johney Maine On 09/15/2014 at 2241 hrs.   Electronically Signed   By: Genevie Ann M.D.   On: 09/15/2014 20:52   Dg Chest Port 1 View  09/04/2014   CLINICAL DATA:  Sepsis.  Fever, weakness and cough  EXAM: PORTABLE CHEST - 1 VIEW  COMPARISON:  09/02/2014  FINDINGS: There is a left subclavian catheter with tip in the projection of the SVC. Left  IJ catheter is also noted with tip in the SVC. The nasogastric tube is in the stomach. There is no pleural effusion identified. New opacity is identified in the right base. Left lung appears clear.  IMPRESSION: 1. New right lung base opacity.   Electronically Signed   By: Kerby Moors M.D.   On: 09/04/2014 15:16   Dg Chest Port 1 View  09/02/2014   CLINICAL DATA:  Generalized body aches.  Weakness.  Leukocytosis.  EXAM: PORTABLE CHEST - 1 VIEW  COMPARISON:  08/31/2014  FINDINGS: Support lines and tubes in appropriate position. Heart size is normal. Both lungs remain clear. No evidence of pleural effusion or pneumothorax.  IMPRESSION: No active disease.   Electronically Signed   By: Earle Gell M.D.   On: 09/02/2014 17:31   Dg Chest Port 1 View  08/31/2014   CLINICAL DATA:  Central line placement.  Initial encounter.  EXAM: PORTABLE CHEST - 1 VIEW  COMPARISON:  Chest radiograph performed 08/30/2014  FINDINGS: A left IJ line is noted ending about the distal SVC. A left-sided chest port is noted ending about the mid to distal SVC. The patient's enteric tube is seen extending below the diaphragm.  Pulmonary vascularity is at the upper limits of normal. Minimal bibasilar atelectasis is noted. No pleural effusion or pneumothorax is seen.  The cardiomediastinal silhouette is normal in size. No acute osseous abnormalities are identified.  IMPRESSION: 1. Left IJ line noted ending about the distal SVC. 2. Minimal bibasilar atelectasis noted.  Lungs otherwise clear.   Electronically Signed   By: Garald Balding M.D.   On: 08/31/2014 21:54   Dg Chest Port 1  View  08/28/2014   CLINICAL DATA:  Chest pain, tachycardia, history of colon cancer  EXAM: PORTABLE CHEST - 1 VIEW  COMPARISON:  08/24/2014  FINDINGS: Lungs are clear.  No pleural effusion or pneumothorax.  The heart is normal in size.  Left chest port terminates at the cavoatrial junction.  IMPRESSION: No evidence of acute cardiopulmonary disease.   Electronically  Signed   By: Julian Hy M.D.   On: 08/28/2014 19:54   Dg Abd 2 Views  08/29/2014   CLINICAL DATA:  Recent removal nasogastric tube for small bowel obstruction. Persistent nausea and vomiting.  EXAM: ABDOMEN - 2 VIEW  COMPARISON:  Abdominal radiographs 08/28/2014 and 08/26/2014. Acute abdominal series 08/24/2014.  FINDINGS: Prior Port-A-Cath projects over the left chest, with the distal tip projecting over the distal superior vena cava. The lung bases are clear. Heart size is normal.  There are scattered air-fluid levels within small bowel and colon on the upright view. No evidence of pneumoperitoneum. Air-fluid level is noted in the gastric fundus.  Gaseous distention of a central small bowel loop appears decreased compared to the radiographs of 08/28/2014. Small bowel now measures 2.5 cm (previously 3.8 cm in this region). Gas is seen within a bowel loop in the right lower quadrant, projecting over the right iliac bone and right sacrum; it is difficult to determine if this is dilated small bowel or colon. There is a moderate amount of stool within the colon; overall, colonic stool burden has decreased since 08/26/2014. A distal colonic stent is again noted.  IMPRESSION: Further improvement in small bowel distention since radiographs dated 08/28/2014.  Colonic stent present.  Decreasing stool burden.   Electronically Signed   By: Curlene Dolphin M.D.   On: 08/29/2014 11:27   Dg Abd 2 Views  08/28/2014   CLINICAL DATA:  Abdominal discomfort and distension. Small bowel obstruction.  EXAM: ABDOMEN - 2 VIEW  COMPARISON:  08/26/2014  FINDINGS: Stable position of the colonic stent in the mid pelvis. Slight improvement in the small bowel distension compared to 08/26/2014. NG tube in the proximal stomach. Minimal change in the residual colonic stool burden. No acute osseous finding.  IMPRESSION: Slight improvement in small bowel distension compatible with resolving obstruction.   Electronically Signed   By: Jerilynn Mages.   Shick M.D.   On: 08/28/2014 08:37   Dg Abd Acute W/chest  08/30/2014   CLINICAL DATA:  Follow-up small bowel obstruction. Intermittent abdominal pain with nausea and vomiting.  EXAM: DG ABDOMEN ACUTE W/ 1V CHEST  COMPARISON:  08/29/2014  FINDINGS: Large stool burden throughout the colon. Continued mildly prominent mid abdominal small bowel loops with scattered air-fluid levels. No free air organomegaly. Presumed colonic stent in the pelvis.  Left Port-A-Cath remains in place, unchanged. Heart is normal size. Mild hyperinflation of the lungs without focal opacity or effusion.  IMPRESSION: Continued mild prominence of mid abdominal small bowel loops with scattered air-fluid levels. Large stool burden throughout the colon. No real change since prior study.   Electronically Signed   By: Rolm Baptise M.D.   On: 08/30/2014 10:34   Dg Abd Acute W/chest  08/24/2014   CLINICAL DATA:  Chemotherapy for colon cancer. Dehydration. Diffuse pain.  EXAM: DG ABDOMEN ACUTE W/ 1V CHEST  COMPARISON:  Abdominal CT 07/23/2014  FINDINGS: Chronic retention of stool above a sigmoid colon metallic stent. No evidence of high-grade obstruction or change from previous. No concerning intra-abdominal mass effect or calcification. Lung bases are clear.  IMPRESSION: Chronic retention  of stool above of the patient's sigmoid colon stent. The stool appears desiccated and is likely resistant to passing through the stent. No high-grade obstruction or progression since 07/23/2014.   Electronically Signed   By: Monte Fantasia M.D.   On: 08/24/2014 06:51     Medical Consultants:    Surgery  Cardiology  Infectious Disease  Anti-Infectives:    Primaxin 08/25/14 -->09/06/14  Flagyl 08/25/14 --> 08/29/14  Subjective:   Sanjuan Dame says her nausea is a little bit better today. Was able to eat some soup.  No vomiting.  Has small amount of fecal matter and gas in colostomy.  No shortness of breath or cough.  Objective:    Filed  Vitals:   09/16/14 0458 09/16/14 1444 09/16/14 2159 09/17/14 0617  BP: 114/44 143/58 130/53 108/50  Pulse: 68 75 69 68  Temp: 98.7 F (37.1 C) 98.9 F (37.2 C) 98 F (36.7 C) 98 F (36.7 C)  TempSrc: Oral Oral Oral Oral  Resp: 18 18 18 18   Height:      Weight: 47.265 kg (104 lb 3.2 oz)   46.85 kg (103 lb 4.6 oz)  SpO2: 100% 100% 100% 100%    Intake/Output Summary (Last 24 hours) at 09/17/14 0748 Last data filed at 09/17/14 0600  Gross per 24 hour  Intake 2728.49 ml  Output   1100 ml  Net 1628.49 ml    Exam: Gen:  NAD Cardiovascular:  RRR, No M/R/G Respiratory:  Lungs CTAB Gastrointestinal:  Abdomen soft, NT/ND, + BS Extremities:  No C/E/C   Data Reviewed:    Labs: Basic Metabolic Panel:  Recent Labs Lab 09/11/14 0400 09/14/14 0543 09/15/14 0355 09/17/14 0400  NA 136 136 136 136  K 3.8 3.5 3.7 4.1  CL 102 107 103 102  CO2 26 25 26 26   GLUCOSE 115* 106* 113* 114*  BUN 13 14 14 19   CREATININE 0.41* 0.37* 0.36* 0.37*  CALCIUM 8.2* 8.0* 8.3* 8.5*  MG 1.9 1.8  --   --   PHOS 4.2 3.9  --   --    GFR Estimated Creatinine Clearance: 57.4 mL/min (by C-G formula based on Cr of 0.37). Liver Function Tests:  Recent Labs Lab 09/11/14 0400 09/14/14 0543  AST 19 14*  ALT 13* 11*  ALKPHOS 127* 119  BILITOT 0.3 0.4  PROT 5.7* 5.9*  ALBUMIN 2.1* 2.2*   CBC:  Recent Labs Lab 09/11/14 0400 09/14/14 0543 09/15/14 0355  WBC 7.8 7.0 7.0  NEUTROABS 5.3  --   --   HGB 7.5* 7.6* 7.7*  HCT 23.9* 25.1* 25.1*  MCV 88.2 87.5 88.1  PLT 190 215 217   CBG:  Recent Labs Lab 09/16/14 0448 09/16/14 1215 09/16/14 1732 09/16/14 2338 09/17/14 0615  GLUCAP 118* 103* 108* 121* 110*    Microbiology No results found for this or any previous visit (from the past 240 hour(s)).   Medications:   . acetaminophen  1,000 mg Oral TID  . feeding supplement (ENSURE ENLIVE)  237 mL Oral BID BM  . insulin aspart  0-15 Units Subcutaneous 4 times per day  . nicotine  21  mg Transdermal Daily  . ondansetron  8 mg Oral TID AC & HS  . sodium chloride  10-40 mL Intracatheter Q12H  . sodium chloride  3 mL Intravenous Q12H   Continuous Infusions: . sodium chloride 50 mL/hr at 09/17/14 0403  . Marland KitchenTPN (CLINIMIX-E) Adult 100 mL/hr at 09/16/14 1925   And  . fat  emulsion 234 mL (09/16/14 1820)    Time spent: 25 minutes.   LOS: 23 days   [facility] Pager [phone]. If unable to reach me by pager, please call my cell phone at [phone].  *Please refer to [email or web address], password [email or web address] to get updated schedule on who will round on this patient, as hospitalists switch teams weekly. If 7PM-7AM, please contact night-coverage at [email or web address], password [email or web address] for any overnight needs.  09/17/2014, 7:48 AM

## 2014-09-18 DIAGNOSIS — K9049 Malabsorption due to intolerance, not elsewhere classified: Secondary | ICD-10-CM | POA: Insufficient documentation

## 2014-09-18 DIAGNOSIS — R112 Nausea with vomiting, unspecified: Secondary | ICD-10-CM | POA: Insufficient documentation

## 2014-09-18 DIAGNOSIS — K904 Malabsorption due to intolerance, not elsewhere classified: Secondary | ICD-10-CM

## 2014-09-18 LAB — GLUCOSE, CAPILLARY
Glucose-Capillary: 128 mg/dL — ABNORMAL HIGH (ref 65–99)
Glucose-Capillary: 132 mg/dL — ABNORMAL HIGH (ref 65–99)
Glucose-Capillary: 145 mg/dL — ABNORMAL HIGH (ref 65–99)
Glucose-Capillary: 84 mg/dL (ref 65–99)
Glucose-Capillary: 89 mg/dL (ref 65–99)

## 2014-09-18 LAB — CBC
HEMATOCRIT: 26.9 % — AB (ref 36.0–46.0)
HEMOGLOBIN: 8.2 g/dL — AB (ref 12.0–15.0)
MCH: 26.9 pg (ref 26.0–34.0)
MCHC: 30.5 g/dL (ref 30.0–36.0)
MCV: 88.2 fL (ref 78.0–100.0)
Platelets: 261 10*3/uL (ref 150–400)
RBC: 3.05 MIL/uL — AB (ref 3.87–5.11)
RDW: 17.6 % — ABNORMAL HIGH (ref 11.5–15.5)
WBC: 9 10*3/uL (ref 4.0–10.5)

## 2014-09-18 LAB — COMPREHENSIVE METABOLIC PANEL
ALBUMIN: 2.4 g/dL — AB (ref 3.5–5.0)
ALT: 12 U/L — AB (ref 14–54)
AST: 15 U/L (ref 15–41)
Alkaline Phosphatase: 137 U/L — ABNORMAL HIGH (ref 38–126)
Anion gap: 3 — ABNORMAL LOW (ref 5–15)
BUN: 28 mg/dL — ABNORMAL HIGH (ref 6–20)
CALCIUM: 8.2 mg/dL — AB (ref 8.9–10.3)
CHLORIDE: 103 mmol/L (ref 101–111)
CO2: 27 mmol/L (ref 22–32)
Creatinine, Ser: 0.41 mg/dL — ABNORMAL LOW (ref 0.44–1.00)
GFR calc Af Amer: >60 mL/min (ref >60)
Glucose, Bld: 102 mg/dL — ABNORMAL HIGH (ref 65–99)
Potassium: 4.2 mmol/L (ref 3.5–5.1)
SODIUM: 133 mmol/L — AB (ref 135–145)
Total Bilirubin: 0.2 mg/dL — ABNORMAL LOW (ref 0.3–1.2)
Total Protein: 5.9 g/dL — ABNORMAL LOW (ref 6.5–8.1)

## 2014-09-18 LAB — DIFFERENTIAL
BASOS PCT: 0 % (ref 0–1)
Basophils Absolute: 0 10*3/uL (ref 0.0–0.1)
Eosinophils Absolute: 0.4 10*3/uL (ref 0.0–0.7)
Eosinophils Relative: 5 % (ref 0–5)
LYMPHS PCT: 18 % (ref 12–46)
Lymphs Abs: 1.6 10*3/uL (ref 0.7–4.0)
MONO ABS: 1 10*3/uL (ref 0.1–1.0)
Monocytes Relative: 11 % (ref 3–12)
Neutro Abs: 5.9 10*3/uL (ref 1.7–7.7)
Neutrophils Relative %: 66 % (ref 43–77)

## 2014-09-18 LAB — PREALBUMIN: PREALBUMIN: 14.8 mg/dL — AB (ref 18–38)

## 2014-09-18 LAB — MAGNESIUM: Magnesium: 2.1 mg/dL (ref 1.7–2.4)

## 2014-09-18 LAB — PHOSPHORUS: Phosphorus: 3.8 mg/dL (ref 2.5–4.6)

## 2014-09-18 LAB — TRIGLYCERIDES: Triglycerides: 113 mg/dL (ref <150)

## 2014-09-18 MED ORDER — ALPRAZOLAM 1 MG PO TABS
1.0000 mg | ORAL_TABLET | Freq: Every evening | ORAL | Status: DC | PRN
  Administered 2014-09-18 – 2014-09-20 (×3): 1 mg via ORAL
  Filled 2014-09-18 (×3): qty 1

## 2014-09-18 MED ORDER — TRACE MINERALS CR-CU-MN-SE-ZN 10-1000-500-60 MCG/ML IV SOLN
INTRAVENOUS | Status: AC
  Administered 2014-09-18: 18:00:00 via INTRAVENOUS
  Filled 2014-09-18: qty 1700

## 2014-09-18 MED ORDER — INSULIN ASPART 100 UNIT/ML ~~LOC~~ SOLN
0.0000 [IU] | Freq: Four times a day (QID) | SUBCUTANEOUS | Status: DC
  Administered 2014-09-18 – 2014-09-19 (×2): 1 [IU] via SUBCUTANEOUS

## 2014-09-18 MED ORDER — COLLAGENASE 250 UNIT/GM EX OINT
TOPICAL_OINTMENT | Freq: Every day | CUTANEOUS | Status: DC
  Administered 2014-09-19: 15:00:00 via TOPICAL
  Administered 2014-09-20: 1 via TOPICAL
  Administered 2014-09-21: 13:00:00 via TOPICAL
  Filled 2014-09-18: qty 30

## 2014-09-18 MED ORDER — FAT EMULSION 20 % IV EMUL
234.0000 mL | INTRAVENOUS | Status: AC
  Administered 2014-09-18 (×2): 234 mL via INTRAVENOUS
  Filled 2014-09-18: qty 250

## 2014-09-18 MED ORDER — SODIUM CHLORIDE 0.9 % IV SOLN: INTRAVENOUS | Status: AC

## 2014-09-18 MED ORDER — SODIUM CHLORIDE 0.9 % IV SOLN
INTRAVENOUS | Status: DC
  Administered 2014-09-19: 07:00:00 via INTRAVENOUS

## 2014-09-18 NOTE — Progress Notes (Signed)
Progress Note   Sydney Morrison MOQ:[id number] DOB: 1955-07-23 DOA: 08/25/2014 PCP: Delphina Cahill, MD   Brief Narrative:   Sydney Morrison is an 59 y.o. female with a PMH of rectal cancer with liver metastasis, s/p rectal stent placement in 06/2014, on chemotherapy (last cycle completed 08/16/2014) under Dr. Gearldine Shown care who was admitted 08/25/2014 with ongoing nausea, vomiting and abdominal pain secondary to small bowel obstruction.She underwent exploratory laparotomy on 08/31/2014 with lower anterior rectosigmoid resection with colostomy. She is also s/p appendectomy, hysterectomy, and salpingoophorectomy, and debulking of peritoneum. Postoperatively she developed ileus requiring nutritional support with TPN. Hospital course was complicated by the development of sepsis due to possible pneumonia, treated with flagyl and Primaxin. All antibiotics stopped 09/06/2014.  Assessment/Plan:   Principal Problem: Small bowel obstruction / Nausea, vomiting and abdominal pain / Postoperative ileus  - CT abdomen on 08/26/2014 showed high-grade distal small bowel obstruction, likely from peritoneal metastatic disease.  - Status post exploratory laparotomy 08/31/2014 with lower anterior rectosigmoid resection with colostomy.  - She is also s/p appendectomy, hysterectomy, and salpingoophorectomy, and debulking of peritoneum by Dr. Michael Boston. - Post operatively she developed ileus. CT scan done 09/15/14: No significant findings to explain ongoing nausea other than ileus. - Continue TPN for nutritional support and FL diet as tolerated.  - Continue when necessary hydroxyzine, Ativan, Reglan, Zofran.   Active Problems: Hyponatremia - On TNA, pharmacy will adjust electrolytes.  Sepsis secondary to possible PNA - CXR on 09/04/2014 showed right lung base opacity concerning for pneumonia. - Status post treatment with primaxin and flagyl.  Anemia of chronic disease  - Likely secondary to history of malignancy.   - Patient has received total 3 units of PRBC transfusion during this hospital stay.  Rectal ca with liver mets - Last chemotherapy back in 07/2014. - Outpt follow up with Dr. Benay Spice.   Chest pain and Aflutter with RVR - 2 D ECHO with preserved EF. - Cardiology has seen the pt in consultation; last note 6/7/206. - A flutter occurred in the setting of multiple medical issues including hypokalemia, resolving SBO, metastatic rectal cancer. - Per cardiology, if recurrent sustained episodes, can treat with IV metoprolol for rate control.  Thrombocytopenia - Resolved.  Deep tissue pressure injury / sacral decubitus ulcer - Evaluated by wound care nurse.  -.Patient has deep tissue pressure injury in evolution on the left side of gluteal cleft. - Hydrotherapy and santyl ordered.  Tobacco abuse - Continue nicotine patch as tolerated.  Severe protein calorie malnutrition / Failure to thrive / Underweight  - Continue TPN for nutritional support. Now on cyclical TNA.  - Body mass index is 16.96 kg/(m^2). - Appetite beginning to improve.  DVT Prophylaxis  - SCD's.   Code Status: Full.  Family Communication: Husband updated at the bedside. Disposition Plan: Home once her nutrition & nausea improves.    IV Access:    CVC triple lumen placed in left internal jugular 08/31/14  Port-A-Cath left chest   Procedures and diagnostic studies:   Ct Abdomen Pelvis Wo Contrast  08/26/2014   CLINICAL DATA:  Nausea and vomiting. Left lower quadrant pain for 3 days.  EXAM: CT ABDOMEN AND PELVIS WITHOUT CONTRAST  TECHNIQUE: Multidetector CT imaging of the abdomen and pelvis was performed following the standard protocol without IV contrast.  COMPARISON:  07/23/2014  FINDINGS: BODY WALL: No contributory findings.  LOWER CHEST: No acute findings  ABDOMEN/PELVIS:  Liver: Multi focal hepatic metastatic disease with faint mineralization.  The masses appear decreased from comparison study, especially  the dominant left lobe mass which now measures 6 cm (previously 9 cm).  Biliary: No evidence of biliary obstruction or stone.  Pancreas: Unremarkable.  Spleen: Unremarkable.  Adrenals: Unremarkable.  Kidneys and ureters: No hydronephrosis. Punctate left nephrolithiasis  Bladder: Unremarkable.  Bowel: There is a stably positioned stent in the sigmoid colon to upper rectum with unchanged surrounding bowel wall thickening and erosion of the poximal stent into the wall, likely reaching the serosa. Formed stool present throughout the colon. High-density material is seen within the noninflamed appendix. Major change is diffuse dilation of small bowel with fluid levels, with transition point likely in the pelvis where there is peritoneal metastatic disease. No evidence of bowel necrosis or perforation  Peritoneum: Diffuse peritoneal metastases coating in the pelvic peritoneal recesses. Small ascites.  Vascular: No acute abnormality.  OSSEOUS: No acute abnormalities.  IMPRESSION: 1. 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 2. Unchanged positioning of the distal colonic stent, including erosion into the colonic wall. Large volume of desiccated stool present above the stent. 3. Extensive hepatic metastatic disease. The largest metastasis in the left lobe has decreased from 07/23/2014.   Electronically Signed   By: Monte Fantasia M.D.   On: 08/26/2014 02:00   Dg Abd 1 View  08/26/2014   CLINICAL DATA:  Metastatic colon cancer. Followup bowel obstruction.  EXAM: ABDOMEN - 1 VIEW  COMPARISON:  08/25/2014.  FINDINGS: Position of the distal colonic stent is unchanged from 08/24/2014. Again noted are multiple dilated loops of small bowel consistent with bowel obstruction. The degree of bowel distention is not significantly improved from previous exam.  IMPRESSION: 1. No change in small bowel obstruction pattern.   Electronically Signed   By: Kerby Moors M.D.   On: 08/26/2014 09:22    Ct Abdomen Pelvis W Contrast  09/15/2014   CLINICAL DATA:  59 year old female with metastatic rectal cancer status post recent extensive abdominal surgery including resection of omental metastases, ovarian metastases, supracervical hysterectomy, multifocal small and large bowel resection and reanastomosis, and descending colostomy. Persistent nausea and vomiting. Subsequent encounter.  EXAM: CT ABDOMEN AND PELVIS WITH CONTRAST  TECHNIQUE: Multidetector CT imaging of the abdomen and pelvis was performed using the standard protocol following bolus administration of intravenous contrast.  CONTRAST:  50mL OMNIPAQUE IOHEXOL 300 MG/ML SOLN, 160mL OMNIPAQUE IOHEXOL 300 MG/ML SOLN  COMPARISON:  Acute abdominal series 08/30/14 and earlier. Preoperative CT Abdomen and Pelvis 08/25/2014  FINDINGS: New since the prior CT right lower lobe tree-in-bud and irregular peri-bronchovascular opacity (series 4, image 22). No pleural or pericardial effusion. Elsewhere the lung bases appear stable.  No acute osseous abnormality identified.  Interval distal large bowel resection along with the uterus and ovaries. Blind ending rectal stump with adjacent and right lower quadrant percutaneous postoperative drain. Presacral mild presacral stranding and trace fluid. No definite residual tumor identified in the pelvis.  Small volume of gas in the urinary bladder (series 2, image 83). No bladder catheter at this time.  There is also DN decubitus subcutaneous stranding which is new around the coccyx greater on the left of midline (series 2, image 81). No bony erosion identified.  Left lower abdomen descending colostomy with no adverse features. Upstream there is a mixture of dilute contrast and retained stool. There are 2 bowel anastomoses in the right abdomen with somewhat patulous appearing but contrast opacified intervening colon. Stomach and duodenum appear within normal limits. No dilated small bowel.  There are several pockets of fluid in  the distal small bowel mesentery (arrows on series 2, image 74). These are not definitely rim enhancing.  Metastatic disease to the liver re- identified with the largest liver metastases measuring 5-6 cm diameter. When compared to the noncontrast recent comparison no significant changes demonstrated.  Gallbladder, spleen, pancreas and adrenal glands remain within normal limits. Kidneys are within normal limits. Distal ureters are included on delayed excretory phase images and appear normal.  No pneumoperitoneum. No free fluid in the upper abdomen. Major arterial structures in the abdomen and pelvis appear patent. Aortoiliac calcified atherosclerosis noted. The portal venous system appears patent. No lymphadenopathy identified.  Ventral abdominal wound with no adverse features.  IMPRESSION: 1. No bowel obstruction suspected status post multifocal small and large bowel resection with both primary reanastomoses and descending colostomy. Patulous appearance of colon in the right abdomen might reflect focal ileus. 2. Pockets of fluid in the distal small bowel mesentery are nonspecific. Trace presacral fluid about the right abdominal approach percutaneous postoperative drain. 3. Stable fairly extensive liver metastases. 4. New right lower lobe tree-in-bud and peri-bronchovascular opacity indicative of bronchopneumonia. No pleural effusion. Study discussed by telephone with Dr. Johney Maine On 09/15/2014 at 2241 hrs.   Electronically Signed   By: Genevie Ann M.D.   On: 09/15/2014 20:52   Dg Chest Port 1 View  09/04/2014   CLINICAL DATA:  Sepsis.  Fever, weakness and cough  EXAM: PORTABLE CHEST - 1 VIEW  COMPARISON:  09/02/2014  FINDINGS: There is a left subclavian catheter with tip in the projection of the SVC. Left IJ catheter is also noted with tip in the SVC. The nasogastric tube is in the stomach. There is no pleural effusion identified. New opacity is identified in the right base. Left lung appears clear.  IMPRESSION: 1. New  right lung base opacity.   Electronically Signed   By: Kerby Moors M.D.   On: 09/04/2014 15:16   Dg Chest Port 1 View  09/02/2014   CLINICAL DATA:  Generalized body aches.  Weakness.  Leukocytosis.  EXAM: PORTABLE CHEST - 1 VIEW  COMPARISON:  08/31/2014  FINDINGS: Support lines and tubes in appropriate position. Heart size is normal. Both lungs remain clear. No evidence of pleural effusion or pneumothorax.  IMPRESSION: No active disease.   Electronically Signed   By: Earle Gell M.D.   On: 09/02/2014 17:31   Dg Chest Port 1 View  08/31/2014   CLINICAL DATA:  Central line placement.  Initial encounter.  EXAM: PORTABLE CHEST - 1 VIEW  COMPARISON:  Chest radiograph performed 08/30/2014  FINDINGS: A left IJ line is noted ending about the distal SVC. A left-sided chest port is noted ending about the mid to distal SVC. The patient's enteric tube is seen extending below the diaphragm.  Pulmonary vascularity is at the upper limits of normal. Minimal bibasilar atelectasis is noted. No pleural effusion or pneumothorax is seen.  The cardiomediastinal silhouette is normal in size. No acute osseous abnormalities are identified.  IMPRESSION: 1. Left IJ line noted ending about the distal SVC. 2. Minimal bibasilar atelectasis noted.  Lungs otherwise clear.   Electronically Signed   By: Garald Balding M.D.   On: 08/31/2014 21:54   Dg Chest Port 1 View  08/28/2014   CLINICAL DATA:  Chest pain, tachycardia, history of colon cancer  EXAM: PORTABLE CHEST - 1 VIEW  COMPARISON:  08/24/2014  FINDINGS: Lungs are clear.  No pleural effusion or pneumothorax.  The heart is normal in size.  Left chest port terminates at the cavoatrial junction.  IMPRESSION: No evidence of acute cardiopulmonary disease.   Electronically Signed   By: Julian Hy M.D.   On: 08/28/2014 19:54   Dg Abd 2 Views  08/29/2014   CLINICAL DATA:  Recent removal nasogastric tube for small bowel obstruction. Persistent nausea and vomiting.  EXAM: ABDOMEN -  2 VIEW  COMPARISON:  Abdominal radiographs 08/28/2014 and 08/26/2014. Acute abdominal series 08/24/2014.  FINDINGS: Prior Port-A-Cath projects over the left chest, with the distal tip projecting over the distal superior vena cava. The lung bases are clear. Heart size is normal.  There are scattered air-fluid levels within small bowel and colon on the upright view. No evidence of pneumoperitoneum. Air-fluid level is noted in the gastric fundus.  Gaseous distention of a central small bowel loop appears decreased compared to the radiographs of 08/28/2014. Small bowel now measures 2.5 cm (previously 3.8 cm in this region). Gas is seen within a bowel loop in the right lower quadrant, projecting over the right iliac bone and right sacrum; it is difficult to determine if this is dilated small bowel or colon. There is a moderate amount of stool within the colon; overall, colonic stool burden has decreased since 08/26/2014. A distal colonic stent is again noted.  IMPRESSION: Further improvement in small bowel distention since radiographs dated 08/28/2014.  Colonic stent present.  Decreasing stool burden.   Electronically Signed   By: Curlene Dolphin M.D.   On: 08/29/2014 11:27   Dg Abd 2 Views  08/28/2014   CLINICAL DATA:  Abdominal discomfort and distension. Small bowel obstruction.  EXAM: ABDOMEN - 2 VIEW  COMPARISON:  08/26/2014  FINDINGS: Stable position of the colonic stent in the mid pelvis. Slight improvement in the small bowel distension compared to 08/26/2014. NG tube in the proximal stomach. Minimal change in the residual colonic stool burden. No acute osseous finding.  IMPRESSION: Slight improvement in small bowel distension compatible with resolving obstruction.   Electronically Signed   By: Jerilynn Mages.  Shick M.D.   On: 08/28/2014 08:37   Dg Abd Acute W/chest  08/30/2014   CLINICAL DATA:  Follow-up small bowel obstruction. Intermittent abdominal pain with nausea and vomiting.  EXAM: DG ABDOMEN ACUTE W/ 1V CHEST   COMPARISON:  08/29/2014  FINDINGS: Large stool burden throughout the colon. Continued mildly prominent mid abdominal small bowel loops with scattered air-fluid levels. No free air organomegaly. Presumed colonic stent in the pelvis.  Left Port-A-Cath remains in place, unchanged. Heart is normal size. Mild hyperinflation of the lungs without focal opacity or effusion.  IMPRESSION: Continued mild prominence of mid abdominal small bowel loops with scattered air-fluid levels. Large stool burden throughout the colon. No real change since prior study.   Electronically Signed   By: Rolm Baptise M.D.   On: 08/30/2014 10:34   Dg Abd Acute W/chest  08/24/2014   CLINICAL DATA:  Chemotherapy for colon cancer. Dehydration. Diffuse pain.  EXAM: DG ABDOMEN ACUTE W/ 1V CHEST  COMPARISON:  Abdominal CT 07/23/2014  FINDINGS: Chronic retention of stool above a sigmoid colon metallic stent. No evidence of high-grade obstruction or change from previous. No concerning intra-abdominal mass effect or calcification. Lung bases are clear.  IMPRESSION: Chronic retention of stool above of the patient's sigmoid colon stent. The stool appears desiccated and is likely resistant to passing through the stent. No high-grade obstruction or progression since 07/23/2014.   Electronically Signed   By: Angelica Chessman  Watts M.D.   On: 08/24/2014 06:51     Medical Consultants:    Surgery  Cardiology  Infectious Disease  Anti-Infectives:    Primaxin 08/25/14 -->09/06/14  Flagyl 08/25/14 --> 08/29/14  Subjective:   Sydney Morrison says her nausea is improved today and that she has been able to tolerate full liquids better.  She had a bad night last night and couldn't sleep due to multiple interruptions and a noisy patient nearby. Thinks the nausea is related to the TNA.  Objective:    Filed Vitals:   09/17/14 1357 09/17/14 2032 09/18/14 0514 09/18/14 0609  BP: 126/61 123/50 113/50   Pulse: 72 72 71   Temp: 98.2 F (36.8 C) 98.2 F  (36.8 C) 98.1 F (36.7 C)   TempSrc: Oral Oral Oral   Resp: 22 20 20    Height:      Weight:    48.308 kg (106 lb 8 oz)  SpO2: 100% 99% 99%     Intake/Output Summary (Last 24 hours) at 09/18/14 0727 Last data filed at 09/18/14 0600  Gross per 24 hour  Intake 3020.19 ml  Output    525 ml  Net 2495.19 ml    Exam: Gen:  NAD Cardiovascular:  RRR, No M/R/G Respiratory:  Lungs CTAB Gastrointestinal:  Abdomen soft, NT/ND, + BS Extremities:  No C/E/C   Data Reviewed:    Labs: Basic Metabolic Panel:  Recent Labs Lab 09/14/14 0543 09/15/14 0355 09/17/14 0400 09/18/14 0605  NA 136 136 136 133*  K 3.5 3.7 4.1 4.2  CL 107 103 102 103  CO2 25 26 26 27   GLUCOSE 106* 113* 114* 102*  BUN 14 14 19  28*  CREATININE 0.37* 0.36* 0.37* 0.41*  CALCIUM 8.0* 8.3* 8.5* 8.2*  MG 1.8  --   --  2.1  PHOS 3.9  --   --  3.8   GFR Estimated Creatinine Clearance: 59.2 mL/min (by C-G formula based on Cr of 0.41). Liver Function Tests:  Recent Labs Lab 09/14/14 0543 09/18/14 0605  AST 14* 15  ALT 11* 12*  ALKPHOS 119 137*  BILITOT 0.4 0.2*  PROT 5.9* 5.9*  ALBUMIN 2.2* 2.4*   CBC:  Recent Labs Lab 09/14/14 0543 09/15/14 0355 09/18/14 0605  WBC 7.0 7.0 9.0  NEUTROABS  --   --  5.9  HGB 7.6* 7.7* 8.2*  HCT 25.1* 25.1* 26.9*  MCV 87.5 88.1 88.2  PLT 215 217 261   CBG:  Recent Labs Lab 09/17/14 0615 09/17/14 1142 09/17/14 1814 09/18/14 0019 09/18/14 0511  GLUCAP 110* 109* 98 128* 89    Microbiology No results found for this or any previous visit (from the past 240 hour(s)).   Medications:   . sodium chloride   Intravenous Q24H  . acetaminophen  1,000 mg Oral TID  . feeding supplement (ENSURE ENLIVE)  237 mL Oral BID BM  . heparin subcutaneous  5,000 Units Subcutaneous 3 times per day  . insulin aspart  0-15 Units Subcutaneous Q6H  . nicotine  21 mg Transdermal Daily  . ondansetron  8 mg Oral TID AC & HS  . sodium chloride  10-40 mL Intracatheter Q12H  .  sodium chloride  3 mL Intravenous Q12H   Continuous Infusions:    Time spent: 25 minutes.   LOS: 24 days   [facility] Pager [phone]. If unable to reach me by pager, please call my cell phone at [phone].  *Please refer to [email or web address], password [email or web address] to get  updated schedule on who will round on this patient, as hospitalists switch teams weekly. If 7PM-7AM, please contact night-coverage at [email or web address], password [email or web address] for any overnight needs.  09/18/2014, 7:27 AM

## 2014-09-18 NOTE — Consult Note (Addendum)
WOC wound follow up Patient is tearful and exhausted this morning, states that she only slept 2 hours last night due to the numerous interruptions. Room is dark, she is very quiet, but becomes upset when relaying the frequency of her interruptions this morning when she is trying to rest.  Wound type:Deep tissue pressure injury (DTPI), now an Unstageable pressure injury Measurement: 7cm x 4.5cm  Wound bed:90% obscured by the presence of a soft white eschar.  Pulling away from periphery, yellow wound base with capillary buds apparent at periphery Drainage (amount, consistency, odor) consistent with autolytic debridement of necrotic tissue Periwound:macerated and with edema, but no induration or warmth Dressing procedure/placement/frequency: patient continues to shower daily with a handheld showerhead and use this as hydrotherapy to loosen eschar and wash away residual byproducts of autolysis. Kelly Osborne, CCS PA contacted today with report of findings/assessment this morning. She will look at area on rounds today.  WOC ostomy follow up Stoma type/location: LLQ Colostomy. Patient and husband changed pouch last evening.  It was not leaking, but patient desired a change.  Scheduled pouch change was today, but the impromptu pouch change was great practice in a safe setting. It is a goal met that they are independent in this skill prior to discharge. Note: Patient is not independent; husband is. Supplies in room for two weeks of pouch changes.  Patient is independent in emptying pouch. Stomal assessment/size: 1 and 3/4 inches round, moist, edematous Peristomal assessment: Not seen today Treatment options for stomal/peristomal skin: Skin barrier ring Output: brown stool Ostomy pouching: 2pc. 2 and 3/4 inch pouching system with skin barrier ring. Education provided: Pouch changes can either be on a schedule (Monday/Thursday, Tuesday/Friday, Wednesday/Sunday) which is 3 days between one change and 4 between  the next or change pouches every three days. She understands and will wait to see how things go at home for a few weeks before she decides. Enrolled patient in [employer] Discharge program: Yes  Kit has already arrived at patient's home.  WOC nursing team will follow twice weekly, and will remain available to this patient, the nursing, surgical and medical teams. Please re-consult if needed between visits. Thanks,  , MSN, RN, GNP, CWOCN, CWON-AP ([phone])         

## 2014-09-18 NOTE — Progress Notes (Signed)
PARENTERAL NUTRITION CONSULT NOTE - Follow-up  Pharmacy Consult for TPN Indication: Persistent SBO  Allergies  Allergen Reactions  . Avelox [Moxifloxacin Hcl In Nacl] Hives  . Codeine Hives  . Dextromethorphan Hives  . Erythromycin Hives  . Penicillins Hives    No problem with Imipenem  . Suprep [Na Sulfate-K Sulfate-Mg Sulf] Nausea And Vomiting    Patient Measurements: Height: 5' 7"  (170.2 cm) Weight: 106 lb 8 oz (48.308 kg) IBW/kg (Calculated) : 61.6 Usual Weight: 50.4 kg  Vital Signs: Temp: 98.1 F (36.7 C) (06/20 0514) Temp Source: Oral (06/20 0514) BP: 113/50 mmHg (06/20 0514) Pulse Rate: 71 (06/20 0514) Intake/Output from previous day: 06/19 0701 - 06/20 0700 In: 3670.2 [P.O.:220; I.V.:650; TPN:2800.2] Out: 525 [Urine:525]  Labs:  Recent Labs  09/18/14 0605  WBC 9.0  HGB 8.2*  HCT 26.9*  PLT 261     Recent Labs  09/17/14 0400 09/18/14 0605  NA 136 133*  K 4.1 4.2  CL 102 103  CO2 26 27  GLUCOSE 114* 102*  BUN 19 28*  CREATININE 0.37* 0.41*  CALCIUM 8.5* 8.2*  MG  --  2.1  PHOS  --  3.8  PROT  --  5.9*  ALBUMIN  --  2.4*  AST  --  15  ALT  --  12*  ALKPHOS  --  137*  BILITOT  --  0.2*  PREALBUMIN  --  14.8*  TRIG  --  113   Estimated Creatinine Clearance: 59.2 mL/min (by C-G formula based on Cr of 0.41).    Recent Labs  09/17/14 1814 09/18/14 0019 09/18/14 0511  GLUCAP 98 128* 89   Medications, infusions:  . sodium chloride    . [START ON 09/19/2014] sodium chloride     Insulin Requirements: Moderate SSI q6h: 2 units / 24 hours  Current Nutrition: full liquids as tolerates. Continues to have nausea  IVF: NS at 50 ml/hr during hours TPN is off  Central access: CVC triple lumen, port L chest TPN start date: 6/1  ASSESSMENT                                                                                                          HPI: 51yoF with PMH metastatic rectal cancer and rectal stent 06/2014, s/p 3 cycles chemo presents  5/27 with N/V x 1 day.  Found to have SBO, likely secondary to pelvic tumor on CT.  NG tube placed and later removed once SBO improved on Abd XR.  Started on CL diet, but persistent N/V prevent meaningful PO intake.  To start on TPN per pharmacy for nutritional supplementation until tolerating PO.  Significant events:  5/31: patient misunderstood Surgeon to imply that TPN was not going to start today and did not allow TPN to be initiated. 6/2: s/p exp laparotomy, SB resection, LAR, stent, colostomy, appendectomy, hysterectomy, salpingo-oophorectomy, transverse colon resection, debulking of peritoneum. 6/6: clamp NG tube 6/7: NGT d/c'ed 6/9: started CLD 6/12: small amount of stool in ostomy, + flatus 6/14: low iron - feraheme 510 mg given 6/17:  repeat CT:  No bowel obstruction, Pockets of fluid in the distal small bowel mesentery are nonspecific.  6/18: start 18h TPN cycle 6/19: change to 12h TPN cycle 6/20: Patient states nausea a little better, able to eat a little bit while TPN was off yesterday, but then nausea came back once TPN turned on in PM. Plan to continue cyclic 62B TPN today per d/w Surgery and possibly consider wean tomorrow.   Today:   Glucose: at goal (< 150), no Hx DM  Electrolytes: Na slightly low at 133, all others WNL  Renal: SCr low, stable  LFTs: AST WNL, ALT low, Alk Phos slightly elevated at 137, Tbili low at 0.2  TGs: elevated at baseline at 226 (6/1), now WNL--116 (6/13), 113 (6/20)  Prealbumin: 13.2 (6/1), 6 (6/6), 9.8 (6/13), 14.8 (6/20)  NUTRITIONAL GOALS                                                                                             RD recs: Nutrition needs: 1600-1800 kcal, 70-80 grams protein.  Clinimix 5/20 @ 60 mL/hr + 20% IV fat emulsion at 10 ml/hr to provide 72 grams protein and 1746 kcal   Starting 6/19: 12h cycle Clinimix E5/15 (1746m) + 20% IV fat emulsion (2384m to provide 85gm protein and 1675 kcal  PLAN                                                                                                                          2) At 1800 today:  Continue cyclic Clinimix E 5/7/6278315VV/61Yrom 180737-1062t the following rate:  50 mL/hr x 1 hour, then 160 ml/hr x 10 hours, then 50 ml/hr x 1 hour, then off from 0600-1759  Continue 20% IV fat emulsion at 19.5 ml/hr over 12h.  TPN to contain standard multivitamins and trace elements.  Increase MIVF to NS at 75 ml/hr to infuse during hours TPN is off, as Na slightly low today- watch I/O, weight  Change SSI to sensitive scale. Continue with CBGs q6h for now.  Consider change to q8h if insulin requirements remain low and patient tolerating cyclic 1269SPN cycle.  TPN lab panels on Mondays & Thursdays.  BMET in AM.  F/u ability to advance/tolerate diet.    JiLindell SparPharmD, BCPS Pager: [phone]/20/2016 2:48 PM

## 2014-09-18 NOTE — Progress Notes (Signed)
Patient ID: Sydney Morrison, female   DOB: 01/18/1956, 59 y.o.   MRN: [id number] 18 Days Post-Op  Subjective: Pt states she was able to tolerate broth, popsicle, etc yesterday when TNA was cycled.  She states when it got turned back on last night that she redeveloped some of her nausea.    Objective: Vital signs in last 24 hours: Temp:  [98.1 F (36.7 C)-98.2 F (36.8 C)] 98.1 F (36.7 C) (06/20 0514) Pulse Rate:  [71-72] 71 (06/20 0514) Resp:  [20-22] 20 (06/20 0514) BP: (113-126)/(50-61) 113/50 mmHg (06/20 0514) SpO2:  [99 %-100 %] 99 % (06/20 0514) Weight:  [48.308 kg (106 lb 8 oz)] 48.308 kg (106 lb 8 oz) (06/20 0609) Last BM Date: 09/18/14 (small amount of stool in bag this am)  Intake/Output from previous day: 06/19 0701 - 06/20 0700 In: 3670.2 [P.O.:220; I.V.:650; TPN:2800.2] Out: 525 [Urine:525] Intake/Output this shift:    PE: Abd: soft, NT, midline wound is almost healed, ostomy is working well with feculent output Skin: unstageable decubitus ulcer with some malodor to it.  100% fibrin, black eschar no longer present.    Lab Results:   Recent Labs  09/18/14 0605  WBC 9.0  HGB 8.2*  HCT 26.9*  PLT 261   BMET  Recent Labs  09/17/14 0400 09/18/14 0605  NA 136 133*  K 4.1 4.2  CL 102 103  CO2 26 27  GLUCOSE 114* 102*  BUN 19 28*  CREATININE 0.37* 0.41*  CALCIUM 8.5* 8.2*   PT/INR No results for input(s): LABPROT, INR in the last 72 hours. CMP     Component Value Date/Time   NA 133* 09/18/2014 0605   NA 140 08/16/2014 1037   K 4.2 09/18/2014 0605   K 4.2 08/16/2014 1037   CL 103 09/18/2014 0605   CO2 27 09/18/2014 0605   CO2 28 08/16/2014 1037   GLUCOSE 102* 09/18/2014 0605   GLUCOSE 92 08/16/2014 1037   BUN 28* 09/18/2014 0605   BUN 23.1 08/16/2014 1037   CREATININE 0.41* 09/18/2014 0605   CREATININE 0.7 08/16/2014 1037   CALCIUM 8.2* 09/18/2014 0605   CALCIUM 9.5 08/16/2014 1037   PROT 5.9* 09/18/2014 0605   PROT 7.5 08/16/2014 1037   ALBUMIN 2.4* 09/18/2014 0605   ALBUMIN 3.8 08/16/2014 1037   AST 15 09/18/2014 0605   AST 21 08/16/2014 1037   ALT 12* 09/18/2014 0605   ALT 15 08/16/2014 1037   ALKPHOS 137* 09/18/2014 0605   ALKPHOS 143 08/16/2014 1037   BILITOT 0.2* 09/18/2014 0605   BILITOT 0.28 08/16/2014 1037   GFRNONAA >60 09/18/2014 0605   GFRAA >60 09/18/2014 0605   Lipase     Component Value Date/Time   LIPASE 30 08/24/2014 0510       Studies/Results: No results found.  Anti-infectives: Anti-infectives    Start     Dose/Rate Route Frequency Ordered Stop   09/01/14 1200  imipenem-cilastatin (PRIMAXIN) 250 mg in sodium chloride 0.9 % 100 mL IVPB  Status:  Discontinued     250 mg 200 mL/hr over 30 Minutes Intravenous 4 times per day 09/01/14 1051 09/06/14 1759   08/31/14 1030  clindamycin (CLEOCIN) 900 mg, gentamicin (GARAMYCIN) 240 mg in sodium chloride 0.9 % 1,000 mL for intraperitoneal lavage  Status:  Discontinued      Intraperitoneal To Surgery 08/31/14 1023 08/31/14 1513   08/31/14 0600  clindamycin (CLEOCIN) IVPB 900 mg     900 mg 100 mL/hr over 30 Minutes Intravenous 60  min pre-op 08/30/14 1218 08/31/14 0944   08/31/14 0600  [MAR Hold]  gentamicin (GARAMYCIN) 220 mg in dextrose 5 % 100 mL IVPB     (MAR Hold since 08/31/14 1010)   5 mg/kg  44.1 kg 105.5 mL/hr over 60 Minutes Intravenous 60 min pre-op 08/30/14 1218 08/31/14 1005   08/30/14 2100  imipenem-cilastatin (PRIMAXIN) 250 mg in sodium chloride 0.9 % 100 mL IVPB  Status:  Discontinued     250 mg 200 mL/hr over 30 Minutes Intravenous 3 times per day 08/30/14 1739 09/01/14 1051   08/25/14 1800  imipenem-cilastatin (PRIMAXIN) 250 mg in sodium chloride 0.9 % 100 mL IVPB  Status:  Discontinued     250 mg 200 mL/hr over 30 Minutes Intravenous Every 6 hours 08/25/14 1716 08/30/14 1739   08/25/14 1800  metroNIDAZOLE (FLAGYL) IVPB 500 mg  Status:  Discontinued     500 mg 100 mL/hr over 60 Minutes Intravenous Every 8 hours 08/25/14 1716  08/27/14 1003       Assessment/Plan   1.POD18, EXPLORATORY LAPAROTOMY, SMALL BOWEL RESECTION, low anterior resection with COLOSTOMY, APPENDECTOMY, HYSTERECTOMY SUPRACERVICAL ABDOMINAL BILATERAL TUBES AND OVARIES, TRANSVERSE COLON RESECTION, DEBULKING OF PERITONEUM - 09/06/2014 - S. Gross For Small Bowel Obstruction, Rectosigmoid cancer with perforation, Pelvic carcinomatosis, Omental central caking/carcinoimatosis, Liver metatasis [Note: Update note by Dr. Johney Maine on 09/15/2014] Doing well from major surgery except for nausea. Unclear why she is having the problem. She thinks the nausea may be in part to her TNA as when it's off her nausea is improved.  See how she does with it off today.  If her nausea really is improved, then we may need to consider weaning this so she can eat better.  2. Has liver mets Sees Dr. Benay Spice for oncology 3. Post op ileus CT scan - 09/15/2014 - showed no obvious cause of nausea 4. Malnutrition  On TPN Should get labs in AM 5. Anemia -  stable 6. Sacral decubitus ulcer, unstageable - this is mostly fibrinous, malodorous tissue today.  Will start daily hydrotherapy and santyl to clean this up.  7. DVT prophylaxis - heparin/SCDs   LOS: 24 days    Linah Klapper E 09/18/2014, 11:57 AM Pager: [phone]

## 2014-09-18 NOTE — Progress Notes (Signed)
Daily Progress Note   Patient Name: Sydney Morrison       Date: 09/18/2014 DOB: 10/08/1955  Age: 59 y.o. MRN#: [id number] Attending Physician: Venetia Maxon Rama, MD Primary Care Physician: Delphina Cahill, MD Admit Date: 08/25/2014  Reason for Consultation/Follow-up: Non pain symptom management  Subjective:  patient believes her nausea is better controlled when she is off the TPN. She has tried eating jello and chicken broth with good results. Interval Events: Patient did not rest well overnight, she is frustrated and tearful.   Length of Stay: 24 days  Current Medications: Scheduled Meds:  . acetaminophen  1,000 mg Oral TID  . collagenase   Topical Daily  . feeding supplement (ENSURE ENLIVE)  237 mL Oral BID BM  . heparin subcutaneous  5,000 Units Subcutaneous 3 times per day  . insulin aspart  0-15 Units Subcutaneous Q6H  . nicotine  21 mg Transdermal Daily  . ondansetron  8 mg Oral TID AC & HS  . sodium chloride  10-40 mL Intracatheter Q12H  . sodium chloride  3 mL Intravenous Q12H    Continuous Infusions: . sodium chloride    . [START ON 09/19/2014] sodium chloride      PRN Meds: chlorproMAZINE (THORAZINE) IV, HYDROmorphone (DILAUDID) injection, hydrOXYzine, LORazepam, metoCLOPramide (REGLAN) injection, nitroGLYCERIN, oxyCODONE, phenol, promethazine, sodium chloride, zolpidem  Palliative Performance Scale: 50%     Vital Signs: BP 113/50 mmHg  Pulse 71  Temp(Src) 98.1 F (36.7 C) (Oral)  Resp 20  Ht 5\' 7"  (1.702 m)  Wt 48.308 kg (106 lb 8 oz)  BMI 16.68 kg/m2  SpO2 99% SpO2: SpO2: 99 % O2 Device: O2 Device: Not Delivered O2 Flow Rate: O2 Flow Rate (L/min): 0 L/min  Intake/output summary:  Intake/Output Summary (Last 24 hours) at 09/18/14 1207 Last data filed at 09/18/14 0600  Gross per 24 hour  Intake 3020.19 ml  Output    525 ml  Net 2495.19 ml   LBM:   Baseline Weight: Weight: 49.578 kg (109 lb 4.8 oz) Most recent weight: Weight: 48.308 kg (106 lb 8  oz)  Physical Exam: Weak appearing lady Ostomy with liquid stool No edema Non focal Patient is anxious, tearful.              Additional Data Reviewed: Recent Labs     09/17/14  0400  09/18/14  0605  WBC   --   9.0  HGB   --   8.2*  PLT   --   261  NA  136  133*  BUN  19  28*  CREATININE  0.37*  0.41*     Problem List:  Patient Active Problem List   Diagnosis Date Noted  . Hyponatremia 09/18/2014  . Ileus, postoperative 09/17/2014  . Encounter for palliative care   . Protein-calorie malnutrition, severe 09/15/2014  . Tobacco abuse 09/15/2014  . Leucocytosis   . Goals of care, counseling/discussion   . Screen for STD (sexually transmitted disease)   . Pressure ulcer, stage 1   . Postoperative fever   . Arterial hypotension   . Atrial flutter 08/30/2014  . SBO (small bowel obstruction) 08/26/2014  . Nausea and vomiting 08/25/2014  . Antineoplastic chemotherapy induced anemia 08/24/2014  . Dehydration 08/24/2014  . 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 Chronic blood loss anemia 07/02/2014  . 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 Symptom Management:   as above  4. Palliative Prophylaxis:  Stool Softener: yes  5. Prognosis: Unable to determine  5. Discharge Planning: pending   Care plan was discussed with  Patient RN  Thank you for allowing the Palliative Medicine Team to assist in the care of this patient.   Time In: 1030 Time Out: 1055 Total Time 25 Prolonged Time Billed  no     Greater than 50%  of this time was spent counseling and coordinating care related to the above assessment and plan.   Loistine Chance, MD  09/18/2014, 12:07 PM  Please contact Palliative Medicine Team phone at [phone] for questions and concerns.

## 2014-09-18 NOTE — Progress Notes (Signed)
Patient is requesting to rest and is refusing medications today. MD aware and agrees patient should rest.

## 2014-09-19 ENCOUNTER — Encounter: Payer: Self-pay | Admitting: *Deleted

## 2014-09-19 LAB — GLUCOSE, CAPILLARY
GLUCOSE-CAPILLARY: 105 mg/dL — AB (ref 65–99)
GLUCOSE-CAPILLARY: 128 mg/dL — AB (ref 65–99)
GLUCOSE-CAPILLARY: 63 mg/dL — AB (ref 65–99)
GLUCOSE-CAPILLARY: 75 mg/dL (ref 65–99)
GLUCOSE-CAPILLARY: 89 mg/dL (ref 65–99)
Glucose-Capillary: 95 mg/dL (ref 65–99)

## 2014-09-19 LAB — BASIC METABOLIC PANEL
ANION GAP: 9 (ref 5–15)
BUN: 26 mg/dL — ABNORMAL HIGH (ref 6–20)
CHLORIDE: 101 mmol/L (ref 101–111)
CO2: 26 mmol/L (ref 22–32)
CREATININE: 0.41 mg/dL — AB (ref 0.44–1.00)
Calcium: 8.1 mg/dL — ABNORMAL LOW (ref 8.9–10.3)
GFR calc non Af Amer: >60 mL/min (ref >60)
Glucose, Bld: 99 mg/dL (ref 65–99)
Potassium: 4.1 mmol/L (ref 3.5–5.1)
Sodium: 136 mmol/L (ref 135–145)

## 2014-09-19 MED ORDER — SODIUM CHLORIDE 0.9 % IV SOLN
INTRAVENOUS | Status: DC
  Administered 2014-09-20 (×2): via INTRAVENOUS

## 2014-09-19 NOTE — Progress Notes (Signed)
CBG checked 63, patient no complaints of any discomfort, no dizziness, no headaches, offered orange juice but refused, wants ginger ale only, CBG rechecked 75.

## 2014-09-19 NOTE — Progress Notes (Signed)
Physical Therapy Wound Evaluation Patient Details  Name: Sydney Morrison MRN: [id number] Date of Birth: 02/18/1956  Today's Date: 09/19/2014 Time: 2263-3354 Time Calculation (min): 32 min  Subjective  Subjective: Pt is a 59 year old female s/p ex lap with colostomy on 6/2 with PMH of rectal cancer with liver metastasis, s/p rectal stent placement in 06/2014, on chemotherapy.  Pt presents with unstageable sacral pressure ulcer. Patient and Family Stated Goals: agreeable to wound care for pressure ulcer Prior Treatments: dressing changes per RN, using shower head to wound  Pain Score:  Pt declined premedication, only reporting moderate pain during selective debridement  Wound Assessment                                                  Pressure Ulcer 09/19/14 Unstageable - Full thickness tissue loss in which the base of the ulcer is covered by slough (yellow, tan, gray, green or brown) and/or eschar (tan, brown or black) in the wound bed. HYDRO - sacrum/L buttock unstageable ulcer (Active)  Dressing Type Moist to dry;Foam 09/19/2014  2:00 PM  Dressing Changed 09/19/2014  2:00 PM  Dressing Change Frequency Every other day 09/19/2014  2:00 PM  State of Healing Non-healing 09/19/2014  2:00 PM  Site / Wound Assessment Yellow 09/19/2014  2:00 PM  % Wound base Red or Granulating 10% 09/19/2014  2:00 PM  % Wound base Yellow 90% 09/19/2014  2:00 PM  Peri-wound Assessment Erythema (non-blanchable);Pink 09/19/2014  2:00 PM  Wound Length (cm) 6.5 cm 09/19/2014  2:00 PM  Wound Width (cm) 4.5 cm 09/19/2014  2:00 PM  Margins Unattached edges (unapproximated) 09/19/2014  2:00 PM  Drainage Amount Minimal 09/19/2014  2:00 PM  Drainage Description Purulent;Odor 09/19/2014  2:00 PM  Treatment Debridement (Selective);Hydrotherapy (Pulse lavage);Packing (Saline gauze) 09/19/2014  2:00 PM                                                   Hydrotherapy Pulsed lavage therapy - wound location: sarcal L  buttock pressure ulcer Pulsed Lavage with Suction (psi): 8 psi Pulsed Lavage with Suction - Normal Saline Used: 1000 mL Pulsed Lavage Tip: Tip with splash shield Selective Debridement Selective Debridement - Location: sacral L buttock ulcer Selective Debridement - Tools Used: Scissors;Forceps Selective Debridement - Tissue Removed: yellow, tan, white slough   Wound Assessment and Plan  Wound Therapy - Assess/Plan/Recommendations Wound Therapy - Clinical Statement: Pt would benefit from hydrotherapy to promote wound healing by removing necrotic tissue and promoting healing environment Wound Therapy - Functional Problem List: rectal cancer with liver mets, chemotherapy, attempting to wean from TPN Factors Delaying/Impairing Wound Healing: Multiple medical problems Hydrotherapy Plan: Debridement;Dressing change;Patient/family education;Pulsatile lavage with suction Wound Therapy - Frequency: 6X / week Wound Therapy - Current Recommendations:  (surgery already following) Wound Therapy - Follow Up Recommendations: Home health RN Wound Plan: Perform hydrotherapy to sacral pressure ulcer and redress wound including santyl to assist with debriding, removing necrotic tissue and promoting healing environment  Wound Therapy Goals- Improve the function of patient's integumentary system by progressing the wound(s) through the phases of wound healing (inflammation - proliferation - remodeling) by: Decrease Necrotic Tissue to: 40% Decrease Necrotic Tissue - Progress:  Goal set today Increase Granulation Tissue to: 60% Increase Granulation Tissue - Progress: Goal set today Improve Drainage Characteristics: Min Improve Drainage Characteristics - Progress: Goal set today Goals/treatment plan/discharge plan were made with and agreed upon by patient/family: Yes Time For Goal Achievement: 2 weeks Wound Therapy - Potential for Goals: Good  Goals will be updated until maximal potential achieved or discharge  criteria met.  Discharge criteria: when goals achieved, discharge from hospital, MD decision/surgical intervention, no progress towards goals, refusal/missing three consecutive treatments without notification or medical reason.  GP     Navdeep Halt,KATHrine E 09/19/2014, 2:41 PM Carmelia Bake, PT, DPT 09/19/2014 Pager: [phone]

## 2014-09-19 NOTE — [facility] Oncology Navigator Note (Signed)
Oncology Nurse Navigator Documentation  Oncology Nurse Navigator Flowsheets 09/19/2014  Navigator Encounter Type F/U-Hospital visit  Patient Visit Type Inpatient  Treatment Phase -  Barriers/Navigation Needs No barriers at this time  Support Groups/Services Allowed her to vent her frustration with prolonged hospitalization. Main complaint is lack of sleep. Confirms that she wants to transfer her care to [facility] when she goes home just for easier transportation for her and her husband.  Time Spent with Patient 10

## 2014-09-19 NOTE — Plan of Care (Signed)
Problem: Phase III Progression Outcomes Goal: Pain controlled on oral analgesia Outcome: Progressing Not taken but pain is manageable per patient.

## 2014-09-19 NOTE — Progress Notes (Signed)
Nutrition Follow-up  DOCUMENTATION CODES:  Severe malnutrition in context of chronic illness, Underweight  INTERVENTION: - Continue cyclic TPN over 12 hours per pharmacy - Continue diet and diet advancement per surgical team - Will d/c Ensure Enlive per pt request - RD will continue to monitor for needs  NUTRITION DIAGNOSIS:  Malnutrition related to chronic illness as evidenced by percent weight loss, severe depletion of body fat, severe depletion of muscle mass. -ongoing  GOAL:  Patient will meet greater than or equal to 90% of their needs -met   MONITOR:  Other (Comment), PO intake, Weight trends, Labs, I & O's (TPN)  ASSESSMENT: 59 year old female with past medical history significant for rectal cancer with liver metastasis, rectal stent placement in 06/2014 (under Dr. Gearldine Shown care), status post 3 cycles of chemotherapy, last cycle completed 08/16/2014. She started to develop nausea and vomiting over past 24 hours prior to this admission.  6/21: - Diet advanced to Soft this AM. Pt reports she slept in and then took a shower so no breakfast this AM. Plans to try mashed potatoes and [employer] chicken noodle soup for lunch - She states that Ensure is not well tolerated and requests d/c - Indicates that surgical team advised her to "graze" and eat 6 small meals/day; she states she is used to a regimen such as this due to hx of Crohn's disease - MD note indicates pt unable to tolerate food with red dye - Surgical notes indicate pt with decreased nausea when TPN is off and pt confirms this during discussion - Cyclic TPN began 7/85: Clinimix E 5/15 (1700 mL/12h): 50 mL/hr x1 hour, 160 mL/hr x10 hours, 50 mL/hr x1 hour, off from 8850-2774. This regimen, along with 20% lipids @ 19.5 mL/hr/12 hours provides 1675 kcal, 85 grams protein which exceeds protein needs but is likely beneficial. - Will monitor for need for home TPN - Meeting needs - Medications reviewed; notes about pt and  anti-emetic effects noted  Labs: CBGs: 84-145 mg/dL, Na: 133 mmol/L, BUN elevated, creatinine low, Ca: 8.1 mg/dL.   5/29: - Pt followed by [facility], last seen 5/18. - Pt NPO for bowel rest d/t SBO. Pt states that she has not had anything by mouth x 5 days.  - Pt with NGT for suction, output 1000 ml. Recommend nutrition support if bowel rest to last > 7 days.  - Pt at risk for refeeding risk d/t severe malnutrition and poor PO intake PTA. - Pt with 22 lb weight loss since 3/02 (18% weight loss x 3 months, significant for time frame).  6/2 pt s/p PROCEDURE: Procedure(s): EXPLORATORY LAPAROTOMY SMALL BOWEL RESECTION LOW ANTERIOR RECTOSIGMOID RESECTION (including stent) with COLOSTOMY APPENDECTOMY HYSTERECTOMY SUPRACERVICAL ABDOMINAL Salpingo-oophorectomy TRANSVERSE COLON RESECTION WITH PARTIAL OMENTECTOMY DEBULKING OF PERITONEUM  6/3: - Moved to SDU post-op. - Pt tolerating TPN. Currently running at goal rate of 60 mL/hr providing 1267 kcal (79% of needs) and 72 g protein (100% of needs.)   6/10:  - NGT out since last assessment - Diet advanced to CLD 09/07/14 and pt reports she has been taking small sips of gingerale and does not like most of the items on the tray - Pt indicates nausea with intakes, no emesis - Continues with Clinimix E 5/20 @ 60 mL/hr with 20% lipids @ 10 mL/hr which provides 1746 kcal, 72 grams protein - Will order [employer] TID to supplement - Pt will need diet education prior to d/c, will provide as appropriate with diet advancement  6/13: - Pt continues with Clinimix E 5/20 @ 60 mL/hr with 20% lipids @ 10 mL/hr which provides 1746 kcal, 72 grams protein - Pt reports that she does not like [employer]. She does like Ensure and would like this ordered with diet advancement. - She has been having nausea that is not exacerbated by intakes and that she feels is associated with pain - Pt started to have output into ostomy 6/12; noted Reglan  order in place  6/17: - Continues with Clinimix E 5/20 @ 60 mL/hr with 20% lipids @ 10 mL/hr which provides 1746 kcal, 72 grams protein; pharmacy note indicates no plan to wean TPN until pt tolerating FLD - Per surgery note 6/15, pt tried Ensure 6/14 and had dry heaving - Pt indicates she has not had anything PO this AM and POs are not going well - She had jello and broth for dinner last night and feels that CLD is not any better than FLD as related to ability to tolerate - She indicates abdominal pain and nausea this AM - Meeting needs with TPN   Height:  Ht Readings from Last 1 Encounters:  08/25/14 _0  (1.702 m)    Weight:  Wt Readings from Last 1 Encounters:  09/18/14 106 lb 8 oz (48.308 kg)    Ideal Body Weight:  61.3 kg  Wt Readings from Last 10 Encounters:  09/18/14 106 lb 8 oz (48.308 kg)  08/25/14 110 lb (49.896 kg)  08/24/14 109 lb 12.8 oz (49.805 kg)  08/24/14 110 lb (49.896 kg)  08/16/14 110 lb 11.2 oz (50.213 kg)  08/02/14 113 lb (51.256 kg)  07/23/14 114 lb 5 oz (51.852 kg)  07/14/14 118 lb 14.4 oz (53.933 kg)  06/29/14 120 lb (54.432 kg)  05/31/14 125 lb (56.7 kg)    BMI:  Body mass index is 16.68 kg/(m^2).  Estimated Nutritional Needs:  Kcal:  1600-1800  Protein:  70-80g  Fluid:  1.6L/day  Skin:  Wound (see comment) (Stage II sacral ulcer)  Diet Order:  DIET SOFT Room service appropriate?: Yes; Fluid consistency:: Thin  EDUCATION NEEDS:  No education needs identified at this time   Intake/Output Summary (Last 24 hours) at 09/19/14 1155 Last data filed at 09/19/14 0800  Gross per 24 hour  Intake 1051.08 ml  Output      0 ml  Net 1051.08 ml    Last BM:  6/21    Jarome Matin, RD, LDN Inpatient Clinical Dietitian Pager # [phone] After hours/weekend pager # [phone]

## 2014-09-19 NOTE — Progress Notes (Signed)
Progress Note   Sydney Morrison BSJ:[id number] DOB: 02-Sep-1955 DOA: 08/25/2014 PCP: Delphina Cahill, MD   Brief Narrative:   Sydney Morrison is an 59 y.o. female with a PMH of rectal cancer with liver metastasis, s/p rectal stent placement in 06/2014, on chemotherapy (last cycle completed 08/16/2014) under Dr. Gearldine Shown care who was admitted 08/25/2014 with ongoing nausea, vomiting and abdominal pain secondary to small bowel obstruction.She underwent exploratory laparotomy on 08/31/2014 with lower anterior rectosigmoid resection with colostomy. She is also s/p appendectomy, hysterectomy, and salpingoophorectomy, and debulking of peritoneum. Postoperatively she developed ileus requiring nutritional support with TPN. Hospital course was complicated by the development of sepsis due to possible pneumonia, treated with flagyl and Primaxin. All antibiotics stopped 09/06/2014. Barrier to discharge includes ongoing need for nutritional support with TNA. She has had a persistent postoperative ileus which is now beginning to resolve. Anticipate discharge in the next 48 hours.  Assessment/Plan:   Principal Problem: Small bowel obstruction / Nausea, vomiting and abdominal pain / Postoperative ileus  - CT abdomen on 08/26/2014 showed high-grade distal small bowel obstruction, likely from peritoneal metastatic disease.  - Status post exploratory laparotomy 08/31/2014 with lower anterior rectosigmoid resection with colostomy.  - She is also s/p appendectomy, hysterectomy, and salpingoophorectomy, and debulking of peritoneum by Dr. Michael Boston. - Post operatively she developed ileus. CT scan done 09/15/14: No significant findings to explain ongoing nausea other than ileus. - Continue cyclic TPN for nutritional support. Diet advanced to soft today. - Continue when necessary hydroxyzine, Ativan, Reglan, Zofran.   Active Problems: Hyponatremia - On TNA, pharmacy will adjust electrolytes.  Sepsis secondary to possible  PNA - CXR on 09/04/2014 showed right lung base opacity concerning for pneumonia. - Status post treatment with primaxin and flagyl.  Anemia of chronic disease  - Likely secondary to history of malignancy.  - Patient has received total 3 units of PRBC transfusion during this hospital stay.  Rectal ca with liver mets - Last chemotherapy back in 07/2014. - Outpt follow up with Dr. Benay Spice.   Chest pain and Aflutter with RVR - 2 D ECHO with preserved EF. - Cardiology has seen the pt in consultation; last note 6/7/206. - A flutter occurred in the setting of multiple medical issues including hypokalemia, resolving SBO, metastatic rectal cancer. - Per cardiology, if recurrent sustained episodes, can treat with IV metoprolol for rate control.  Thrombocytopenia - Resolved.  Deep tissue pressure injury / sacral decubitus ulcer - Evaluated by wound care nurse.  -.Patient has deep tissue pressure injury in evolution on the left side of gluteal cleft. - Hydrotherapy and santyl ordered. May need further debridement per surgical notes.  Tobacco abuse - Continue nicotine patch as tolerated.  Severe protein calorie malnutrition / Failure to thrive / Underweight  - Continue TPN for nutritional support. Now on cyclical TNA.  - Body mass index is 16.96 kg/(m^2). - Appetite beginning to improve.  DVT Prophylaxis  - SCD's.   Code Status: Full.  Family Communication: Husband updated at the bedside 09/18/14. Disposition Plan: Home once her nutrition & nausea improves.    IV Access:    CVC triple lumen placed in left internal jugular 08/31/14  Port-A-Cath left chest   Procedures and diagnostic studies:   Ct Abdomen Pelvis Wo Contrast  08/26/2014   CLINICAL DATA:  Nausea and vomiting. Left lower quadrant pain for 3 days.  EXAM: CT ABDOMEN AND PELVIS WITHOUT CONTRAST  TECHNIQUE: Multidetector CT imaging of the abdomen and  pelvis was performed following the standard protocol without IV  contrast.  COMPARISON:  07/23/2014  FINDINGS: BODY WALL: No contributory findings.  LOWER CHEST: No acute findings  ABDOMEN/PELVIS:  Liver: Multi focal hepatic metastatic disease with faint mineralization. The masses appear decreased from comparison study, especially the dominant left lobe mass which now measures 6 cm (previously 9 cm).  Biliary: No evidence of biliary obstruction or stone.  Pancreas: Unremarkable.  Spleen: Unremarkable.  Adrenals: Unremarkable.  Kidneys and ureters: No hydronephrosis. Punctate left nephrolithiasis  Bladder: Unremarkable.  Bowel: There is a stably positioned stent in the sigmoid colon to upper rectum with unchanged surrounding bowel wall thickening and erosion of the poximal stent into the wall, likely reaching the serosa. Formed stool present throughout the colon. High-density material is seen within the noninflamed appendix. Major change is diffuse dilation of small bowel with fluid levels, with transition point likely in the pelvis where there is peritoneal metastatic disease. No evidence of bowel necrosis or perforation  Peritoneum: Diffuse peritoneal metastases coating in the pelvic peritoneal recesses. Small ascites.  Vascular: No acute abnormality.  OSSEOUS: No acute abnormalities.  IMPRESSION: 1. 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 2. Unchanged positioning of the distal colonic stent, including erosion into the colonic wall. Large volume of desiccated stool present above the stent. 3. Extensive hepatic metastatic disease. The largest metastasis in the left lobe has decreased from 07/23/2014.   Electronically Signed   By: Monte Fantasia M.D.   On: 08/26/2014 02:00   Dg Abd 1 View  08/26/2014   CLINICAL DATA:  Metastatic colon cancer. Followup bowel obstruction.  EXAM: ABDOMEN - 1 VIEW  COMPARISON:  08/25/2014.  FINDINGS: Position of the distal colonic stent is unchanged from 08/24/2014. Again noted are multiple dilated  loops of small bowel consistent with bowel obstruction. The degree of bowel distention is not significantly improved from previous exam.  IMPRESSION: 1. No change in small bowel obstruction pattern.   Electronically Signed   By: Kerby Moors M.D.   On: 08/26/2014 09:22   Ct Abdomen Pelvis W Contrast  09/15/2014   CLINICAL DATA:  59 year old female with metastatic rectal cancer status post recent extensive abdominal surgery including resection of omental metastases, ovarian metastases, supracervical hysterectomy, multifocal small and large bowel resection and reanastomosis, and descending colostomy. Persistent nausea and vomiting. Subsequent encounter.  EXAM: CT ABDOMEN AND PELVIS WITH CONTRAST  TECHNIQUE: Multidetector CT imaging of the abdomen and pelvis was performed using the standard protocol following bolus administration of intravenous contrast.  CONTRAST:  40mL OMNIPAQUE IOHEXOL 300 MG/ML SOLN, 169mL OMNIPAQUE IOHEXOL 300 MG/ML SOLN  COMPARISON:  Acute abdominal series 08/30/14 and earlier. Preoperative CT Abdomen and Pelvis 08/25/2014  FINDINGS: New since the prior CT right lower lobe tree-in-bud and irregular peri-bronchovascular opacity (series 4, image 22). No pleural or pericardial effusion. Elsewhere the lung bases appear stable.  No acute osseous abnormality identified.  Interval distal large bowel resection along with the uterus and ovaries. Blind ending rectal stump with adjacent and right lower quadrant percutaneous postoperative drain. Presacral mild presacral stranding and trace fluid. No definite residual tumor identified in the pelvis.  Small volume of gas in the urinary bladder (series 2, image 83). No bladder catheter at this time.  There is also DN decubitus subcutaneous stranding which is new around the coccyx greater on the left of midline (series 2, image 81). No bony erosion identified.  Left lower abdomen descending colostomy with no adverse features. Upstream  there is a mixture of  dilute contrast and retained stool. There are 2 bowel anastomoses in the right abdomen with somewhat patulous appearing but contrast opacified intervening colon. Stomach and duodenum appear within normal limits. No dilated small bowel. There are several pockets of fluid in the distal small bowel mesentery (arrows on series 2, image 74). These are not definitely rim enhancing.  Metastatic disease to the liver re- identified with the largest liver metastases measuring 5-6 cm diameter. When compared to the noncontrast recent comparison no significant changes demonstrated.  Gallbladder, spleen, pancreas and adrenal glands remain within normal limits. Kidneys are within normal limits. Distal ureters are included on delayed excretory phase images and appear normal.  No pneumoperitoneum. No free fluid in the upper abdomen. Major arterial structures in the abdomen and pelvis appear patent. Aortoiliac calcified atherosclerosis noted. The portal venous system appears patent. No lymphadenopathy identified.  Ventral abdominal wound with no adverse features.  IMPRESSION: 1. No bowel obstruction suspected status post multifocal small and large bowel resection with both primary reanastomoses and descending colostomy. Patulous appearance of colon in the right abdomen might reflect focal ileus. 2. Pockets of fluid in the distal small bowel mesentery are nonspecific. Trace presacral fluid about the right abdominal approach percutaneous postoperative drain. 3. Stable fairly extensive liver metastases. 4. New right lower lobe tree-in-bud and peri-bronchovascular opacity indicative of bronchopneumonia. No pleural effusion. Study discussed by telephone with Dr. Johney Maine On 09/15/2014 at 2241 hrs.   Electronically Signed   By: Genevie Ann M.D.   On: 09/15/2014 20:52   Dg Chest Port 1 View  09/04/2014   CLINICAL DATA:  Sepsis.  Fever, weakness and cough  EXAM: PORTABLE CHEST - 1 VIEW  COMPARISON:  09/02/2014  FINDINGS: There is a left subclavian  catheter with tip in the projection of the SVC. Left IJ catheter is also noted with tip in the SVC. The nasogastric tube is in the stomach. There is no pleural effusion identified. New opacity is identified in the right base. Left lung appears clear.  IMPRESSION: 1. New right lung base opacity.   Electronically Signed   By: Kerby Moors M.D.   On: 09/04/2014 15:16   Dg Chest Port 1 View  09/02/2014   CLINICAL DATA:  Generalized body aches.  Weakness.  Leukocytosis.  EXAM: PORTABLE CHEST - 1 VIEW  COMPARISON:  08/31/2014  FINDINGS: Support lines and tubes in appropriate position. Heart size is normal. Both lungs remain clear. No evidence of pleural effusion or pneumothorax.  IMPRESSION: No active disease.   Electronically Signed   By: Earle Gell M.D.   On: 09/02/2014 17:31   Dg Chest Port 1 View  08/31/2014   CLINICAL DATA:  Central line placement.  Initial encounter.  EXAM: PORTABLE CHEST - 1 VIEW  COMPARISON:  Chest radiograph performed 08/30/2014  FINDINGS: A left IJ line is noted ending about the distal SVC. A left-sided chest port is noted ending about the mid to distal SVC. The patient's enteric tube is seen extending below the diaphragm.  Pulmonary vascularity is at the upper limits of normal. Minimal bibasilar atelectasis is noted. No pleural effusion or pneumothorax is seen.  The cardiomediastinal silhouette is normal in size. No acute osseous abnormalities are identified.  IMPRESSION: 1. Left IJ line noted ending about the distal SVC. 2. Minimal bibasilar atelectasis noted.  Lungs otherwise clear.   Electronically Signed   By: Garald Balding M.D.   On: 08/31/2014 21:54   Dg Chest [facility]  1 View  08/28/2014   CLINICAL DATA:  Chest pain, tachycardia, history of colon cancer  EXAM: PORTABLE CHEST - 1 VIEW  COMPARISON:  08/24/2014  FINDINGS: Lungs are clear.  No pleural effusion or pneumothorax.  The heart is normal in size.  Left chest port terminates at the cavoatrial junction.  IMPRESSION: No evidence  of acute cardiopulmonary disease.   Electronically Signed   By: Julian Hy M.D.   On: 08/28/2014 19:54   Dg Abd 2 Views  08/29/2014   CLINICAL DATA:  Recent removal nasogastric tube for small bowel obstruction. Persistent nausea and vomiting.  EXAM: ABDOMEN - 2 VIEW  COMPARISON:  Abdominal radiographs 08/28/2014 and 08/26/2014. Acute abdominal series 08/24/2014.  FINDINGS: Prior Port-A-Cath projects over the left chest, with the distal tip projecting over the distal superior vena cava. The lung bases are clear. Heart size is normal.  There are scattered air-fluid levels within small bowel and colon on the upright view. No evidence of pneumoperitoneum. Air-fluid level is noted in the gastric fundus.  Gaseous distention of a central small bowel loop appears decreased compared to the radiographs of 08/28/2014. Small bowel now measures 2.5 cm (previously 3.8 cm in this region). Gas is seen within a bowel loop in the right lower quadrant, projecting over the right iliac bone and right sacrum; it is difficult to determine if this is dilated small bowel or colon. There is a moderate amount of stool within the colon; overall, colonic stool burden has decreased since 08/26/2014. A distal colonic stent is again noted.  IMPRESSION: Further improvement in small bowel distention since radiographs dated 08/28/2014.  Colonic stent present.  Decreasing stool burden.   Electronically Signed   By: Curlene Dolphin M.D.   On: 08/29/2014 11:27   Dg Abd 2 Views  08/28/2014   CLINICAL DATA:  Abdominal discomfort and distension. Small bowel obstruction.  EXAM: ABDOMEN - 2 VIEW  COMPARISON:  08/26/2014  FINDINGS: Stable position of the colonic stent in the mid pelvis. Slight improvement in the small bowel distension compared to 08/26/2014. NG tube in the proximal stomach. Minimal change in the residual colonic stool burden. No acute osseous finding.  IMPRESSION: Slight improvement in small bowel distension compatible with  resolving obstruction.   Electronically Signed   By: Jerilynn Mages.  Shick M.D.   On: 08/28/2014 08:37   Dg Abd Acute W/chest  08/30/2014   CLINICAL DATA:  Follow-up small bowel obstruction. Intermittent abdominal pain with nausea and vomiting.  EXAM: DG ABDOMEN ACUTE W/ 1V CHEST  COMPARISON:  08/29/2014  FINDINGS: Large stool burden throughout the colon. Continued mildly prominent mid abdominal small bowel loops with scattered air-fluid levels. No free air organomegaly. Presumed colonic stent in the pelvis.  Left Port-A-Cath remains in place, unchanged. Heart is normal size. Mild hyperinflation of the lungs without focal opacity or effusion.  IMPRESSION: Continued mild prominence of mid abdominal small bowel loops with scattered air-fluid levels. Large stool burden throughout the colon. No real change since prior study.   Electronically Signed   By: Rolm Baptise M.D.   On: 08/30/2014 10:34   Dg Abd Acute W/chest  08/24/2014   CLINICAL DATA:  Chemotherapy for colon cancer. Dehydration. Diffuse pain.  EXAM: DG ABDOMEN ACUTE W/ 1V CHEST  COMPARISON:  Abdominal CT 07/23/2014  FINDINGS: Chronic retention of stool above a sigmoid colon metallic stent. No evidence of high-grade obstruction or change from previous. No concerning intra-abdominal mass effect or calcification. Lung bases are clear.  IMPRESSION: Chronic  retention of stool above of the patient's sigmoid colon stent. The stool appears desiccated and is likely resistant to passing through the stent. No high-grade obstruction or progression since 07/23/2014.   Electronically Signed   By: Monte Fantasia M.D.   On: 08/24/2014 06:51     Medical Consultants:    Surgery  Cardiology  Infectious Disease  Anti-Infectives:    Primaxin 08/25/14 -->09/06/14  Flagyl 08/25/14 --> 08/29/14  Subjective:   Sydney Morrison is beginning to feel better. Still having intermittent nausea, but overall much improved. No vomiting. Affect much brighter. Appetite beginning to  pick up.  Objective:    Filed Vitals:   09/18/14 0609 09/18/14 1548 09/18/14 2021 09/19/14 0656  BP:  108/57 139/63 107/60  Pulse:  78 75 68  Temp:  97.8 F (36.6 C) 98.6 F (37 C) 98.1 F (36.7 C)  TempSrc:  Oral Oral Oral  Resp:  20 18 14   Height:      Weight: 48.308 kg (106 lb 8 oz)     SpO2:  99% 100% 100%    Intake/Output Summary (Last 24 hours) at 09/19/14 0739 Last data filed at 09/19/14 0000  Gross per 24 hour  Intake 964.83 ml  Output      0 ml  Net 964.83 ml    Exam: Gen:  NAD, affect brighter Cardiovascular:  RRR, No M/R/G Respiratory:  Lungs CTAB Gastrointestinal:  Abdomen soft, NT/ND, + BS Extremities:  No C/E/C   Data Reviewed:    Labs: Basic Metabolic Panel:  Recent Labs Lab 09/14/14 0543 09/15/14 0355 09/17/14 0400 09/18/14 0605  NA 136 136 136 133*  K 3.5 3.7 4.1 4.2  CL 107 103 102 103  CO2 25 26 26 27   GLUCOSE 106* 113* 114* 102*  BUN 14 14 19  28*  CREATININE 0.37* 0.36* 0.37* 0.41*  CALCIUM 8.0* 8.3* 8.5* 8.2*  MG 1.8  --   --  2.1  PHOS 3.9  --   --  3.8   GFR Estimated Creatinine Clearance: 59.2 mL/min (by C-G formula based on Cr of 0.41). Liver Function Tests:  Recent Labs Lab 09/14/14 0543 09/18/14 0605  AST 14* 15  ALT 11* 12*  ALKPHOS 119 137*  BILITOT 0.4 0.2*  PROT 5.9* 5.9*  ALBUMIN 2.2* 2.4*   CBC:  Recent Labs Lab 09/14/14 0543 09/15/14 0355 09/18/14 0605  WBC 7.0 7.0 9.0  NEUTROABS  --   --  5.9  HGB 7.6* 7.7* 8.2*  HCT 25.1* 25.1* 26.9*  MCV 87.5 88.1 88.2  PLT 215 217 261   CBG:  Recent Labs Lab 09/18/14 0511 09/18/14 1157 09/18/14 2045 09/18/14 2326 09/19/14 0207  GLUCAP 89 84 132* 145* 128*    Microbiology No results found for this or any previous visit (from the past 240 hour(s)).   Medications:   . acetaminophen  1,000 mg Oral TID  . collagenase   Topical Daily  . feeding supplement (ENSURE ENLIVE)  237 mL Oral BID BM  . heparin subcutaneous  5,000 Units Subcutaneous 3  times per day  . insulin aspart  0-9 Units Subcutaneous Q6H  . nicotine  21 mg Transdermal Daily  . ondansetron  8 mg Oral TID AC & HS  . sodium chloride  10-40 mL Intracatheter Q12H  . sodium chloride  3 mL Intravenous Q12H   Continuous Infusions: . sodium chloride 75 mL/hr at 09/19/14 0651    Time spent: 25 minutes.   LOS: 25 days   Rendell Thivierge  [facility] Pager [phone]. If unable to reach me by pager, please call my cell phone at [phone].  *Please refer to [email or web address], password [email or web address] to get updated schedule on who will round on this patient, as hospitalists switch teams weekly. If 7PM-7AM, please contact night-coverage at [email or web address], password [email or web address] for any overnight needs.  09/19/2014, 7:39 AM

## 2014-09-19 NOTE — Progress Notes (Signed)
19 Days Post-Op  Subjective: She had some nausea with the Ensure yesterday so she didn't take much in yesterday.  She feels ok today, She is to get [facility] Rx to buttocks decubitus starting today.  Objective: Vital signs in last 24 hours: Temp:  [97.8 F (36.6 C)-98.6 F (37 C)] 98.1 F (36.7 C) (06/21 0656) Pulse Rate:  [68-78] 68 (06/21 0656) Resp:  [14-20] 14 (06/21 0656) BP: (107-139)/(57-63) 107/60 mmHg (06/21 0656) SpO2:  [99 %-100 %] 100 % (06/21 0656) Last BM Date: 09/18/14 (small amount of stool in bag this am) 480 PO Full liquids Nothing more recorded Afebrile, VSS Labs OK Intake/Output from previous day: 06/20 0701 - 06/21 0700 In: 1444.8 [I.V.:480; TPN:964.8] Out: -  Intake/Output this shift: Total I/O In: 86.3 [I.V.:86.3] Out: -   General appearance: alert, cooperative and no distress Resp: clear to auscultation bilaterally GI: soft, sore, open wound site looks good, good function and output from the ostomy site.  The buttocks wound on the left is moist, white nonviable tissue with some erythema around the edges.    Lab Results:   Recent Labs  09/18/14 0605  WBC 9.0  HGB 8.2*  HCT 26.9*  PLT 261    BMET  Recent Labs  09/18/14 0605 09/19/14 0903  NA 133* 136  K 4.2 4.1  CL 103 101  CO2 27 26  GLUCOSE 102* 99  BUN 28* 26*  CREATININE 0.41* 0.41*  CALCIUM 8.2* 8.1*   PT/INR No results for input(s): LABPROT, INR in the last 72 hours.   Recent Labs Lab 09/14/14 0543 09/18/14 0605  AST 14* 15  ALT 11* 12*  ALKPHOS 119 137*  BILITOT 0.4 0.2*  PROT 5.9* 5.9*  ALBUMIN 2.2* 2.4*     Lipase     Component Value Date/Time   LIPASE 30 08/24/2014 0510     Studies/Results: No results found.  Medications: . acetaminophen  1,000 mg Oral TID  . collagenase   Topical Daily  . feeding supplement (ENSURE ENLIVE)  237 mL Oral BID BM  . heparin subcutaneous  5,000 Units Subcutaneous 3 times per day  . insulin aspart  0-9 Units Subcutaneous  Q6H  . nicotine  21 mg Transdermal Daily  . ondansetron  8 mg Oral TID AC & HS  . sodium chloride  10-40 mL Intracatheter Q12H  . sodium chloride  3 mL Intravenous Q12H    Assessment/Plan 1.POD19, EXPLORATORY LAPAROTOMY, SMALL BOWEL RESECTION, low anterior resection with COLOSTOMY, APPENDECTOMY, HYSTERECTOMY SUPRACERVICAL ABDOMINAL BILATERAL TUBES AND OVARIES, TRANSVERSE COLON RESECTION, DEBULKING OF PERITONEUM - 08/31/2014 - S. Gross For Small Bowel Obstruction, Rectosigmoid cancer with perforation, Pelvic carcinomatosis, Omental central caking/carcinoimatosis, Liver metatasis [Note: Update note by Dr. Johney Maine on 09/15/2014] 2.  Recurrent nausea and vomiting/post op ileus  CT scan - 09/15/2014 - showed no obvious cause of nausea  3. Liver Metastasis Sees Dr. Benay Spice for oncology 4. Post op ileus 4. Malnutrition Prealbumin 14.8 on 09/18/14. On TPN Should get labs in AM 5. Anemia -  stable 6. Sacral decubitus ulcer, unstageable - this is mostly fibrinous, malodorous tissue today. starting daily  hydrotherapy and santyl to clean this up. 7.  DVT:  Heparin/SCD  Plan:  Soft diet, hold TNA and see how she does.  Central Triple lumen catheter left neck is 72 days old and "needs to come out," per nursing protocol.   she does have a Port we can give fluids and TNA if that is necessary.  Starting Hydrotherapy for  the left buttocks decubitus.  I think we will end up debriding this.           LOS: 25 days    Sydney Morrison 09/19/2014

## 2014-09-20 DIAGNOSIS — C189 Malignant neoplasm of colon, unspecified: Secondary | ICD-10-CM

## 2014-09-20 DIAGNOSIS — D500 Iron deficiency anemia secondary to blood loss (chronic): Secondary | ICD-10-CM

## 2014-09-20 DIAGNOSIS — E871 Hypo-osmolality and hyponatremia: Secondary | ICD-10-CM

## 2014-09-20 DIAGNOSIS — K913 Postprocedural intestinal obstruction: Secondary | ICD-10-CM

## 2014-09-20 LAB — BASIC METABOLIC PANEL
ANION GAP: 7 (ref 5–15)
BUN: 16 mg/dL (ref 6–20)
CO2: 28 mmol/L (ref 22–32)
Calcium: 8.4 mg/dL — ABNORMAL LOW (ref 8.9–10.3)
Chloride: 102 mmol/L (ref 101–111)
Creatinine, Ser: 0.43 mg/dL — ABNORMAL LOW (ref 0.44–1.00)
GFR calc Af Amer: >60 mL/min (ref >60)
Glucose, Bld: 91 mg/dL (ref 65–99)
POTASSIUM: 3.7 mmol/L (ref 3.5–5.1)
Sodium: 137 mmol/L (ref 135–145)

## 2014-09-20 LAB — GLUCOSE, CAPILLARY
GLUCOSE-CAPILLARY: 92 mg/dL (ref 65–99)
GLUCOSE-CAPILLARY: 78 mg/dL (ref 65–99)
GLUCOSE-CAPILLARY: 85 mg/dL (ref 65–99)
Glucose-Capillary: 96 mg/dL (ref 65–99)

## 2014-09-20 MED ORDER — NICOTINE 14 MG/24HR TD PT24
14.0000 mg | MEDICATED_PATCH | Freq: Every day | TRANSDERMAL | Status: DC
  Administered 2014-09-21: 14 mg via TRANSDERMAL
  Filled 2014-09-20: qty 1

## 2014-09-20 MED ORDER — METOCLOPRAMIDE HCL 10 MG PO TABS
5.0000 mg | ORAL_TABLET | Freq: Three times a day (TID) | ORAL | Status: DC
  Filled 2014-09-20 (×2): qty 1

## 2014-09-20 MED ORDER — ACETAMINOPHEN 500 MG PO TABS: 1000.0000 mg | ORAL_TABLET | Freq: Four times a day (QID) | ORAL | Status: DC | PRN

## 2014-09-20 MED ORDER — UNJURY VANILLA POWDER
2.0000 [oz_av] | Freq: Three times a day (TID) | ORAL | Status: DC
  Administered 2014-09-20: 2 [oz_av] via ORAL
  Filled 2014-09-20 (×6): qty 27

## 2014-09-20 NOTE — Progress Notes (Signed)
NUTRITION NOTE  Pt being followed by RD. New consult for calorie count.  48 hour calorie count ordered. Will place envelope on pt's door and check back daily.  Diet: Soft Supplements: Unjury TID  Nutrition Dx: Malnutrition related to chronic illness as evidenced by percent weight loss, severe depletion of body fat, severe depletion of muscle mass  Goal: Pt to meet >/= 90% estimated nutrition needs  Intervention: Cyclic TPN per pharmacy, continue Soft diet, assess for tolerance of Unjury TID    Jarome Matin, RD, LDN Inpatient Clinical Dietitian Pager # [phone] After hours/weekend pager # [phone]

## 2014-09-20 NOTE — Progress Notes (Signed)
[facility] PROGRESS NOTE  Sydney Morrison NTI:[id number] DOB: Jan 31, 1956 DOA: 08/25/2014 PCP: Delphina Cahill, MD  Assessment/Plan: Small bowel obstruction / Nausea, vomiting and abdominal pain / Postoperative ileus  - CT abdomen on 08/26/2014 showed high-grade distal small bowel obstruction, likely from peritoneal metastatic disease.  - Status post exploratory laparotomy 08/31/2014 with lower anterior rectosigmoid resection with colostomy.  - She is also s/p appendectomy, hysterectomy, and salpingoophorectomy, and debulking of peritoneum by Dr. Michael Boston. - Post-operatively pt developed ileus. CT scan done 09/15/14: No significant findings to explain ongoing nausea other than ileus. - Pt had been continued on TPN for nutritional support with TPN ultimately d/c'd as of 6/21 - Pt tolerating soft diet - Continue when necessary hydroxyzine, Ativan, Reglan, Zofran.   Active Problems: Hyponatremia - Cont to follow - Normalized.  Sepsis secondary to possible PNA - CXR on 09/04/2014 showed right lung base opacity concerning for pneumonia. - Pt is now s/p treatment with primaxin and flagyl.  Anemia of chronic disease  - Likely secondary to history of malignancy.  - Patient has received total 3 units of PRBC transfusion during this hospital stay.  Rectal ca with liver mets - Last chemotherapy was in 07/2014. - Outpt follow up with Dr. Benay Spice.   Chest pain and Aflutter with RVR - 2 D ECHO with preserved EF. - Cardiology has seen the pt in consultation; last note 6/7/206. - A flutter occurred in the setting of multiple medical issues including hypokalemia, resolving SBO, metastatic rectal cancer. - Per cardiology, if recurrent sustained episodes, can give with IV metoprolol for rate control.  Thrombocytopenia - Resolved.  Deep tissue pressure injury / sacral decubitus ulcer - Evaluated by wound care nurse.  -.Patient has deep tissue pressure injury in evolution on the left side  of gluteal cleft. - Pt is continued on hydrotherapy with santyl ordered.   Tobacco abuse - Continue nicotine patch as tolerated.  Severe protein calorie malnutrition / Failure to thrive / Underweight  - Had been TPN dependent, now stopped  - Body mass index is 16.96 kg/(m^2). - Cont PO diet as tolerated  DVT Prophylaxis  - SCD's.  Code Status: Full Family Communication: Pt in room (indicate person spoken with, relationship, and if by phone, the number) Disposition Plan: Pending   Consultants:  General Surgery  WOC  ID  Cardiology  Procedures:    Antibiotics:  Primaxin 08/25/14 -->09/06/14  Flagyl 08/25/14 --> 08/29/14  HPI/Subjective: Tolerating diet without n/v. Denies abd pain. Reports decreased stool output but is passing gas  Objective: Filed Vitals:   09/19/14 1447 09/19/14 2131 09/20/14 0444 09/20/14 1514  BP: 126/47 124/64 117/65 132/65  Pulse: 75 78 79 75  Temp: 97.9 F (36.6 C) 98.5 F (36.9 C) 98.2 F (36.8 C) 98.5 F (36.9 C)  TempSrc: Oral Oral Oral Oral  Resp: 16 18 18 18   Height:      Weight:   47.129 kg (103 lb 14.4 oz)   SpO2: 100% 100% 100% 99%    Intake/Output Summary (Last 24 hours) at 09/20/14 1541 Last data filed at 09/20/14 1023  Gross per 24 hour  Intake  897.5 ml  Output      0 ml  Net  897.5 ml   Filed Weights   09/17/14 0617 09/18/14 0609 09/20/14 0444  Weight: 46.85 kg (103 lb 4.6 oz) 48.308 kg (106 lb 8 oz) 47.129 kg (103 lb 14.4 oz)    Exam:   General:  Awake, in nad  Cardiovascular:  regular, s1, s2  Respiratory: normal resp effort, no wheezing  Abdomen: soft, pos BS, ostomy bag in place with small amounts of stool  Musculoskeletal: perfused no clubbing   Data Reviewed: Basic Metabolic Panel:  Recent Labs Lab 09/14/14 0543 09/15/14 0355 09/17/14 0400 09/18/14 0605 09/19/14 0903 09/20/14 0430  NA 136 136 136 133* 136 137  K 3.5 3.7 4.1 4.2 4.1 3.7  CL 107 103 102 103 101 102  CO2 25 26 26 27 26  28   GLUCOSE 106* 113* 114* 102* 99 91  BUN 14 14 19  28* 26* 16  CREATININE 0.37* 0.36* 0.37* 0.41* 0.41* 0.43*  CALCIUM 8.0* 8.3* 8.5* 8.2* 8.1* 8.4*  MG 1.8  --   --  2.1  --   --   PHOS 3.9  --   --  3.8  --   --    Liver Function Tests:  Recent Labs Lab 09/14/14 0543 09/18/14 0605  AST 14* 15  ALT 11* 12*  ALKPHOS 119 137*  BILITOT 0.4 0.2*  PROT 5.9* 5.9*  ALBUMIN 2.2* 2.4*   No results for input(s): LIPASE, AMYLASE in the last 168 hours. No results for input(s): AMMONIA in the last 168 hours. CBC:  Recent Labs Lab 09/14/14 0543 09/15/14 0355 09/18/14 0605  WBC 7.0 7.0 9.0  NEUTROABS  --   --  5.9  HGB 7.6* 7.7* 8.2*  HCT 25.1* 25.1* 26.9*  MCV 87.5 88.1 88.2  PLT 215 217 261   Cardiac Enzymes: No results for input(s): CKTOTAL, CKMB, CKMBINDEX, TROPONINI in the last 168 hours. BNP (last 3 results) No results for input(s): BNP in the last 8760 hours.  ProBNP (last 3 results) No results for input(s): PROBNP in the last 8760 hours.  CBG:  Recent Labs Lab 09/19/14 1525 09/19/14 1712 09/19/14 2356 09/20/14 0622 09/20/14 1251  GLUCAP 75 95 105* 92 96    No results found for this or any previous visit (from the past 240 hour(s)).   Studies: No results found.  Scheduled Meds: . collagenase   Topical Daily  . heparin subcutaneous  5,000 Units Subcutaneous 3 times per day  . insulin aspart  0-9 Units Subcutaneous Q6H  . metoCLOPramide  5 mg Oral TID AC  . [START ON 09/21/2014] nicotine  14 mg Transdermal Daily  . ondansetron  8 mg Oral TID AC & HS  . protein supplement  2 oz Oral TID  . sodium chloride  10-40 mL Intracatheter Q12H  . sodium chloride  3 mL Intravenous Q12H   Continuous Infusions: . 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 If 7PM-7AM, please contact night-coverage at [email or web address], password [facility] 09/20/2014, 3:41 PM  LOS: 26 days

## 2014-09-20 NOTE — Progress Notes (Signed)
Physical Therapy Wound Treatment Patient Details  Name: Sydney Morrison MRN: [id number] Date of Birth: May 07, 1955  Today's Date: 09/20/2014 Time: 1120-1155 Time Calculation (min): 35 min  Subjective  Subjective: Pt is a 59 year old female s/p ex lap with colostomy on 6/2 with PMH of rectal cancer with liver metastasis, s/p rectal stent placement in 06/2014, on chemotherapy.  Pt presents with unstageable sacral pressure ulcer. Patient and Family Stated Goals: agreeable to wound care for pressure ulcer Prior Treatments: dressing changes per RN, using shower head to wound  Pain Score:  Pt only with occasional moderate pain during selective debridement, adjusted tx to pain tolerance, pt continues to decline need for pain medication  Wound Assessment                                                  Pressure Ulcer 09/19/14 Unstageable - Full thickness tissue loss in which the base of the ulcer is covered by slough (yellow, tan, gray, green or brown) and/or eschar (tan, brown or black) in the wound bed. HYDRO - sacrum/L buttock unstageable ulcer (Active)  Dressing Type Moist to dry;Foam 09/20/2014  2:00 PM  Dressing Changed 09/20/2014  2:00 PM  Dressing Change Frequency Every other day 09/20/2014  2:00 PM  State of Healing Non-healing 09/20/2014  2:00 PM  Site / Wound Assessment Yellow 09/20/2014  2:00 PM  % Wound base Red or Granulating 20% 09/20/2014  2:00 PM  % Wound base Yellow 80% 09/20/2014  2:00 PM  Peri-wound Assessment Erythema (non-blanchable);Pink 09/20/2014  2:00 PM  Wound Length (cm) 6.5 cm 09/19/2014  2:00 PM  Wound Width (cm) 4.5 cm 09/19/2014  2:00 PM  Margins Unattached edges (unapproximated) 09/20/2014  2:00 PM  Drainage Amount Moderate 09/20/2014  2:00 PM  Drainage Description Purulent;Odor 09/20/2014  2:00 PM  Treatment Debridement (Selective);Hydrotherapy (Pulse lavage);Packing (Saline gauze) 09/20/2014  2:00 PM                                                     Hydrotherapy Pulsed lavage therapy - wound location: sarcal L buttock pressure ulcer Pulsed Lavage with Suction (psi): 8 psi Pulsed Lavage with Suction - Normal Saline Used: 1000 mL Pulsed Lavage Tip: Tip with splash shield Selective Debridement Selective Debridement - Location: sacral L buttock ulcer Selective Debridement - Tools Used: Scissors;Forceps Selective Debridement - Tissue Removed: yellow, tan, white, nonviable tissue, slough   Wound Assessment and Plan  Wound Therapy - Assess/Plan/Recommendations Wound Therapy - Clinical Statement: Pt would benefit from hydrotherapy to promote wound healing by removing necrotic tissue and promoting healing environment Wound Therapy - Functional Problem List: rectal cancer with liver mets, chemotherapy, attempting to wean from TPN Factors Delaying/Impairing Wound Healing: Multiple medical problems Hydrotherapy Plan: Debridement;Dressing change;Patient/family education;Pulsatile lavage with suction Wound Therapy - Frequency: 6X / week Wound Therapy - Follow Up Recommendations: Home health RN Wound Plan: Perform hydrotherapy to sacral pressure ulcer and redress wound including santyl to assist with debriding, removing necrotic tissue and promoting healing environment  Wound Therapy Goals- Improve the function of patient's integumentary system by progressing the wound(s) through the phases of wound healing (inflammation - proliferation - remodeling) by: Decrease Necrotic Tissue to:  40% Decrease Necrotic Tissue - Progress: Progressing toward goal Increase Granulation Tissue to: 60% Increase Granulation Tissue - Progress: Progressing toward goal Improve Drainage Characteristics: Min Improve Drainage Characteristics - Progress: Progressing toward goal Goals/treatment plan/discharge plan were made with and agreed upon by patient/family: Yes Time For Goal Achievement: 2 weeks Wound Therapy - Potential for Goals: Good  Goals will be updated  until maximal potential achieved or discharge criteria met.  Discharge criteria: when goals achieved, discharge from hospital, MD decision/surgical intervention, no progress towards goals, refusal/missing three consecutive treatments without notification or medical reason.  GP     Zakariye Nee,KATHrine E 09/20/2014, 2:44 PM Carmelia Bake, PT, DPT 09/20/2014 Pager: [phone]

## 2014-09-20 NOTE — Progress Notes (Signed)
20 Days Post-Op  Subjective: She looks and feels better.  Able to eat small amounts at one time, but she tolerated it well. We have some supplements for her and I will get a calorie count to be sure she is getting enough.    Objective: Vital signs in last 24 hours: Temp:  [97.9 F (36.6 C)-98.5 F (36.9 C)] 98.2 F (36.8 C) (06/22 0444) Pulse Rate:  [75-79] 79 (06/22 0444) Resp:  [16-18] 18 (06/22 0444) BP: (117-126)/(47-65) 117/65 mmHg (06/22 0444) SpO2:  [100 %] 100 % (06/22 0444) Weight:  [47.129 kg (103 lb 14.4 oz)] 47.129 kg (103 lb 14.4 oz) (06/22 0444) Last BM Date: 09/18/14 (small amount of stool in bag this am) 240 PO recorded, + Urine x 1 recorded Afebrile,VSS Labs OK Intake/Output from previous day: 06/21 0701 - 06/22 0700 In: 983.8 [P.O.:240; I.V.:743.8] Out: -  Intake/Output this shift: Total I/O In: 240 [P.O.:240] Out: -   General appearance: alert, cooperative and no distress Resp: clear to auscultation bilaterally GI: soft, still sore, drain out, wound healing.  ostomy working.  Buttocks, she had her first hydro Rx and some of the white nonviable tissue is already gone.  Continue hydro rx.  Lab Results:   Recent Labs  09/18/14 0605  WBC 9.0  HGB 8.2*  HCT 26.9*  PLT 261    BMET  Recent Labs  09/19/14 0903 09/20/14 0430  NA 136 137  K 4.1 3.7  CL 101 102  CO2 26 28  GLUCOSE 99 91  BUN 26* 16  CREATININE 0.41* 0.43*  CALCIUM 8.1* 8.4*   PT/INR No results for input(s): LABPROT, INR in the last 72 hours.   Recent Labs Lab 09/14/14 0543 09/18/14 0605  AST 14* 15  ALT 11* 12*  ALKPHOS 119 137*  BILITOT 0.4 0.2*  PROT 5.9* 5.9*  ALBUMIN 2.2* 2.4*     Lipase     Component Value Date/Time   LIPASE 30 08/24/2014 0510     Studies/Results: No results found.  Medications: . acetaminophen  1,000 mg Oral TID  . collagenase   Topical Daily  . heparin subcutaneous  5,000 Units Subcutaneous 3 times per day  . insulin aspart  0-9  Units Subcutaneous Q6H  . nicotine  21 mg Transdermal Daily  . ondansetron  8 mg Oral TID AC & HS  . protein supplement  2 oz Oral TID  . sodium chloride  10-40 mL Intracatheter Q12H  . sodium chloride  3 mL Intravenous Q12H    Assessment/Plan 1.POD 20, EXPLORATORY LAPAROTOMY, SMALL BOWEL RESECTION, low anterior resection with COLOSTOMY, APPENDECTOMY, HYSTERECTOMY SUPRACERVICAL ABDOMINAL BILATERAL TUBES AND OVARIES, TRANSVERSE COLON RESECTION, DEBULKING OF PERITONEUM - 08/31/2014 - S. Gross For Small Bowel Obstruction, Rectosigmoid cancer with perforation, Pelvic carcinomatosis, Omental central caking/carcinoimatosis, Liver metatasis [Note: Update note by Dr. Johney Maine on 09/15/2014] 2. Recurrent nausea and vomiting/post op ileus CT scan - 09/15/2014 - showed no obvious cause of nausea  3. Liver Metastasis Sees Dr. Benay Spice for oncology 4. Post op ileus 4. Malnutrition Prealbumin 14.8 on 09/18/14. On TPN Should get labs in AM 5. Anemia -  stable 6. Sacral decubitus ulcer, unstageable - this is mostly fibrinous, malodorous tissue today. starting daily hydrotherapy and santyl to clean this up. 7. DVT: Heparin/SCD   Plan:  Decrease her IV, calorie count, hydrotherapy to buttocks.  If she does well we may be able to get her home by end of week.   LOS: 26 days  Lavarius Doughten 09/20/2014

## 2014-09-20 NOTE — Plan of Care (Signed)
Problem: Phase III Progression Outcomes Goal: Pain controlled on oral analgesia Outcome: Progressing Pt not taking pain medicine but tolerable to patient.

## 2014-09-20 NOTE — Consult Note (Signed)
WOC ostomy follow up Stoma type/location: LLQ, end colostomy Stomal assessment/size: 1 5/8" round, budded, pink, moist Peristomal assessment: intact  Treatment options for stomal/peristomal skin: 2" barrier ring implemented previously  Output brown, pasty output Ostomy pouching: 2pc. 2 3/4" with 2" barrier room Education provided:  Pt wishes to change her pouch today independently.  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 She performed every step of the pouch change with minimal cues only to remove the backing of the wafer prior to placement, otherwise she completed each step with no other asisstance from the [facility] nurse or her husband.  I have answered many of their questions today about diet, pouch change, emptying the pouch. The husband has viewed many different videos online and has a good grasp on ostomy care as well. Extra supplies in the room for patient use.   Enrolled patient in [facility] Start Discharge program: Yes, they have already received information at their home.   They have been in contact with their insurance provider as well regarding supplies and have contact information for DME providers to cover ostomy supplies.  Wound was not assessed today due to the fact surgery in to assess prior to my visit and also she has had hydrotherapy already for the day.    [facility] team will follow along with you for support with ostomy and wound care.  Reata Petrov [place] RN,CWOCN [phone]

## 2014-09-20 NOTE — Care Management Note (Signed)
Case Management Note  Patient Details  Name: Sydney Morrison MRN: [id number] Date of Birth: 31-Oct-1955  Subjective/Objective:                    Action/Plan:   Expected Discharge Date:   (unknown)               Expected Discharge Plan:  [place]  In-House Referral:     Discharge planning Services  CM Consult  Post Acute Care Choice:    Choice offered to:  Patient  DME Arranged:  N/A DME Agency:  NA  HH Arranged:  PT, RN [place] Agency:  [facility]  Status of Service:  In process, will continue to follow  Medicare Important Message Given:    Date Medicare IM Given:    Medicare IM give by:    Date Additional Medicare IM Given:    Additional Medicare Important Message give by:     If discussed at [place] of Stay Meetings, dates discussed:    Additional Comments:Plan to DC this week. No TPN at present time.   Purcell Mouton, RN 09/20/2014, 3:27 PM

## 2014-09-21 ENCOUNTER — Other Ambulatory Visit: Payer: Self-pay | Admitting: *Deleted

## 2014-09-21 DIAGNOSIS — E43 Unspecified severe protein-calorie malnutrition: Secondary | ICD-10-CM

## 2014-09-21 LAB — COMPREHENSIVE METABOLIC PANEL
ALT: 12 U/L — ABNORMAL LOW (ref 14–54)
ANION GAP: 9 (ref 5–15)
AST: 18 U/L (ref 15–41)
Albumin: 2.4 g/dL — ABNORMAL LOW (ref 3.5–5.0)
Alkaline Phosphatase: 131 U/L — ABNORMAL HIGH (ref 38–126)
BUN: 12 mg/dL (ref 6–20)
CALCIUM: 8.6 mg/dL — AB (ref 8.9–10.3)
CO2: 27 mmol/L (ref 22–32)
Chloride: 103 mmol/L (ref 101–111)
Creatinine, Ser: 0.43 mg/dL — ABNORMAL LOW (ref 0.44–1.00)
GFR calc non Af Amer: >60 mL/min (ref >60)
GLUCOSE: 95 mg/dL (ref 65–99)
Potassium: 3.6 mmol/L (ref 3.5–5.1)
SODIUM: 139 mmol/L (ref 135–145)
TOTAL PROTEIN: 6.3 g/dL — AB (ref 6.5–8.1)
Total Bilirubin: 0.5 mg/dL (ref 0.3–1.2)

## 2014-09-21 LAB — MAGNESIUM: Magnesium: 1.8 mg/dL (ref 1.7–2.4)

## 2014-09-21 LAB — GLUCOSE, CAPILLARY
GLUCOSE-CAPILLARY: 96 mg/dL (ref 65–99)
Glucose-Capillary: 116 mg/dL — ABNORMAL HIGH (ref 65–99)

## 2014-09-21 LAB — PHOSPHORUS: Phosphorus: 3.8 mg/dL (ref 2.5–4.6)

## 2014-09-21 MED ORDER — UNJURY VANILLA POWDER: 2.0000 [oz_av] | Freq: Three times a day (TID) | ORAL | Status: DC

## 2014-09-21 MED ORDER — HYDROXYZINE HCL 25 MG PO TABS: 25.0000 mg | ORAL_TABLET | Freq: Four times a day (QID) | ORAL | Status: DC | PRN

## 2014-09-21 MED ORDER — COLLAGENASE 250 UNIT/GM EX OINT: TOPICAL_OINTMENT | Freq: Every day | CUTANEOUS | Status: DC

## 2014-09-21 MED ORDER — HEPARIN SOD (PORK) LOCK FLUSH 100 UNIT/ML IV SOLN
500.0000 [IU] | INTRAVENOUS | Status: AC | PRN
  Administered 2014-09-21: 500 [IU]

## 2014-09-21 MED ORDER — OXYCODONE HCL 5 MG PO TABS: 5.0000 mg | ORAL_TABLET | ORAL | Status: DC | PRN

## 2014-09-21 MED ORDER — METOCLOPRAMIDE HCL 5 MG PO TABS: 5.0000 mg | ORAL_TABLET | Freq: Three times a day (TID) | ORAL | Status: DC

## 2014-09-21 NOTE — Progress Notes (Signed)
NUTRITION NOTE  Stopped by pt's room to follow-up today for calorie count. No meal tickets in envelope. Pt reports that she is going home today; off TPN.   She reports fair appetite and tolerance of food the past few days and that surgical team advised soft foods for a few days before incorporating other foods. Pt asked RD about this as well as post-prandial glucose of 112 mg/dL. Advised pt to follow surgical recommendations and to incorporate one new food at a time. Assured her the CBG of 112 mg/dL was not concerning as she had, per her report, just finished a bowl of [employer] chicken noodle soup.  Pt does not have any other nutrition-related questions or concerns at this time.   Jarome Matin, RD, LDN Inpatient Clinical Dietitian Pager # [phone] After hours/weekend pager # [phone]

## 2014-09-21 NOTE — Discharge Summary (Signed)
Physician Discharge Summary  Sydney Morrison:[id number] DOB: 06-12-1955 DOA: 08/25/2014  PCP: Delphina Cahill, MD  Admit date: 08/25/2014 Discharge date: 09/21/2014  Time spent: 20 minutes  Recommendations for Outpatient Follow-up:  1. Follow up with PCP in 1-2 weeks 2. Follow up with General Surgery as scheduled 3. Follow up with [facility] as scheduled 4. Follow up with Dr. 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 Active Problems:   Chronic blood loss anemia   Dehydration   Nausea and vomiting   SBO (small bowel obstruction)   Atrial flutter   Arterial hypotension   Goals of care, counseling/discussion   Screen for STD (sexually transmitted disease)   Pressure ulcer, stage 1   Postoperative fever   Leucocytosis   Protein-calorie malnutrition, severe   Tobacco abuse   Encounter for palliative care   Ileus, postoperative   Hyponatremia   Nausea & vomiting   Intolerance, food   Discharge Condition: Improved  Diet recommendation: Soft  Filed Weights   09/18/14 0609 09/20/14 0444 09/21/14 0434  Weight: 48.308 kg (106 lb 8 oz) 47.129 kg (103 lb 14.4 oz) 46.9 kg (103 lb 6.3 oz)    History of present illness:  Please see admit h and p from 5/27 for details. Briefly, pt presented with n/v and was ultimately admitted for intractable n/v with concerns for intra-abdominal infection.  Hospital Course:  Brief Narrative:  Sydney Morrison is an 59 y.o. female with a PMH of rectal cancer with liver metastasis, s/p rectal stent placement in 06/2014, on chemotherapy (last cycle completed 08/16/2014) under Dr. Gearldine Shown care who was admitted 08/25/2014 with ongoing nausea, vomiting and abdominal pain secondary to small bowel obstruction.She underwent exploratory laparotomy on 08/31/2014 with lower anterior rectosigmoid resection with colostomy. She is also s/p appendectomy, hysterectomy, and  salpingoophorectomy, and debulking of peritoneum. Postoperatively she developed ileus requiring nutritional support with TPN. Hospital course was complicated by the development of sepsis due to possible pneumonia, treated with flagyl and Primaxin. All antibiotics stopped 09/06/2014. Ultimately, the patient was weaned off TPN and she was able to tolerate a regular diet.     Small bowel obstruction / Nausea, vomiting and abdominal pain / Postoperative ileus  - CT abdomen on 08/26/2014 showed high-grade distal small bowel obstruction, likely from peritoneal metastatic disease.  - Status post exploratory laparotomy 08/31/2014 with lower anterior rectosigmoid resection with colostomy.  - She is also s/p appendectomy, hysterectomy, and salpingoophorectomy, and debulking of peritoneum by Dr. Michael Boston. - Post-operatively pt developed ileus. CT scan done 09/15/14: No significant findings to explain ongoing nausea other than ileus. - Pt had been continued on TPN for nutritional support with TPN ultimately d/c'd as of 6/21 - Pt tolerating soft diet   Hyponatremia - Cont to follow - Normalized.  Sepsis secondary to possible PNA - CXR on 09/04/2014 showed right lung base opacity concerning for pneumonia. - Pt completed treatment with primaxin and flagyl.  Anemia of chronic disease  - Likely secondary to history of malignancy.  - Patient has received total 3 units of PRBC transfusion during this hospital stay.  Rectal ca with liver mets - Last chemotherapy was in 07/2014. - Outpt follow up with Dr. Benay Spice.   Chest pain and Aflutter with RVR - 2 D ECHO with preserved EF. - Cardiology has seen the pt in consultation; last note 6/7/206. - A flutter occurred in the setting of multiple medical issues including hypokalemia,  resolving SBO, metastatic rectal cancer.  Thrombocytopenia - Resolved.  Deep tissue pressure injury / sacral decubitus ulcer - Evaluated by wound care nurse.  -.Patient has  deep tissue pressure injury in evolution on the left side of gluteal cleft. - Showed improvement with hydrotherapy and santyl ordered.   Tobacco abuse - Continue nicotine patch as tolerated.  Severe protein calorie malnutrition / Failure to thrive / Underweight  - Had been TPN dependent, now stopped  - Body mass index is 16.96 kg/(m^2). - Cont PO diet as tolerated  DVT Prophylaxis  - SCD's.   Consultations:  General surgery  [place]  ID  Cardiology  Discharge Exam: Filed Vitals:   09/20/14 0444 09/20/14 1514 09/20/14 2134 09/21/14 0434  BP: 117/65 132/65 122/57 102/50  Pulse: 79 75 77 62  Temp: 98.2 F (36.8 C) 98.5 F (36.9 C) 98.7 F (37.1 C) 98.1 F (36.7 C)  TempSrc: Oral Oral Oral Oral  Resp: 18 18 16 16   Height:      Weight: 47.129 kg (103 lb 14.4 oz)   46.9 kg (103 lb 6.3 oz)  SpO2: 100% 99% 99% 99%    General: awake, in nad Cardiovascular: regular, s1, s2 Respiratory: normal resp effort, no wheezing  Discharge Instructions     Medication List    STOP taking these medications        traMADol 50 MG tablet  Commonly known as:  ULTRAM      TAKE these medications        acetaminophen 325 MG tablet  Commonly known as:  TYLENOL  Take 325 mg by mouth every 6 (six) hours as needed for fever or headache (headahce).     ADRUCIL IV  Inject into the vein every 14 (fourteen) days.     collagenase ointment  Commonly known as:  SANTYL  Apply topically daily. Apply to affected area     cromolyn 5.2 MG/ACT nasal spray  Commonly known as:  NASALCROM  Place 1 spray into both nostrils at bedtime.     dexamethasone 4 MG tablet  Commonly known as:  DECADRON  Take 1 tablet (4 mg total) by mouth 2 (two) times daily. For 2 days. Begin day of pump disconnect.     hydrOXYzine 25 MG tablet  Commonly known as:  ATARAX/VISTARIL  Take 1 tablet (25 mg total) by mouth every 6 (six) hours as needed for itching.     leucovorin in dextrose 5 % 250 mL  Inject into  the vein every 14 (fourteen) days.     lidocaine-prilocaine cream  Commonly known as:  EMLA  Apply 1 application topically as needed. Apply to portacath site 1-2 hours prior to use     LORazepam 0.5 MG tablet  Commonly known as:  ATIVAN  Take 1 tablet (0.5 mg total) by mouth every 6 (six) hours as needed for anxiety.     metoCLOPramide 5 MG tablet  Commonly known as:  REGLAN  Take 1 tablet (5 mg total) by mouth 3 (three) times daily before meals.     nicotine 14 mg/24hr patch  Commonly known as:  NICODERM CQ - dosed in mg/24 hours  Place 14 mg onto the skin daily.     omeprazole 40 MG capsule  Commonly known as:  PRILOSEC  Take 40 mg by mouth daily as needed (nausea and vomiting).     ondansetron 4 MG disintegrating tablet  Commonly known as:  ZOFRAN ODT  4mg  ODT q4 hours prn nausea/vomit  oxaliplatin in dextrose 5 % 500 mL  Inject into the vein every 14 (fourteen) days.     oxyCODONE 5 MG immediate release tablet  Commonly known as:  Oxy IR/ROXICODONE  Take 1-2 tablets (5-10 mg total) by mouth every 4 (four) hours as needed for moderate pain, severe pain or breakthrough pain.     protein supplement Powd  Commonly known as:  UNJURY VANILLA  Take 7 g (2 oz total) by mouth 3 (three) times daily.     Vitamin B-12 CR 1500 MCG Tbcr  Take 1 tablet by mouth daily.     zolpidem 5 MG tablet  Commonly known as:  AMBIEN  Take 1 tablet (5 mg total) by mouth at bedtime as needed for sleep.       Allergies  Allergen Reactions  . Avelox [Moxifloxacin Hcl In Nacl] Hives  . Codeine Hives  . Dextromethorphan Hives  . Erythromycin Hives  . Penicillins Hives    No problem with Imipenem  . Percocet [Oxycodone-Acetaminophen] Nausea And Vomiting    Causes immediate vomiting  . Suprep [Na Sulfate-K Sulfate-Mg Sulf] Nausea And Vomiting       Follow-up Information    Follow up with Adin Hector., MD On 10/11/2014.   Specialty:  General Surgery   Why:  11:30am , arrive by  11:00am for paperwork    Contact information:   [place] [address] [phone]       Follow up with [facility].   Why:  Home Health RN and Physical Therapy   Contact information:   [address] [place] [address] [phone]       Follow up with [facility].   Why:  rolling walker   Contact information:   [address] [place] [address] [phone]       Follow up with Delphina Cahill, MD. Daphane Shepherd on 09/23/2014.   Specialty:  Internal Medicine   Why:  at 11:30am    For Post Hospitalization Follow Up, Bring a list of medications or bottles.   Contact information:    [place] [address] [phone]       Follow up with [facility]              On 10/24/2014.   Why:  9:15am, check in and arrive by 9:00am   Contact information:   [address] [phone]      Follow up with [facility] On 09/22/2014.   Contact information:   [address] [place] [address] [phone]       Follow up with [facility]. Schedule an appointment as soon as possible for a visit in 1 week.   Why:  For Post Hospitalization Follow Up.  Telephone: [phone]    Contact information:   [address] [place] [address]        The results of significant diagnostics from this hospitalization (including imaging, microbiology, ancillary and laboratory) are listed below for reference.    Significant Diagnostic Studies: Ct Abdomen Pelvis Wo Contrast  08/26/2014   CLINICAL DATA:  Nausea and vomiting. Left lower quadrant pain for 3 days.  EXAM: CT ABDOMEN AND PELVIS WITHOUT CONTRAST  TECHNIQUE: Multidetector CT imaging of the abdomen and pelvis was performed following the standard protocol without IV contrast.  COMPARISON:  07/23/2014  FINDINGS: BODY  WALL: No contributory findings.  LOWER CHEST: No acute findings  ABDOMEN/PELVIS:  Liver: Multi focal hepatic metastatic disease with faint mineralization. The masses appear decreased from comparison study, especially the dominant left lobe mass which now measures 6 cm (previously 9 cm).  Biliary: No evidence of biliary obstruction or stone.  Pancreas: Unremarkable.  Spleen: Unremarkable.  Adrenals: Unremarkable.  Kidneys and ureters: No hydronephrosis. Punctate left nephrolithiasis  Bladder: Unremarkable.  Bowel: There is a stably positioned stent in the sigmoid colon to upper rectum with unchanged surrounding bowel wall thickening and erosion of the poximal stent into the wall, likely reaching the serosa. Formed stool present throughout the colon. High-density material is seen within the noninflamed appendix. Major change is diffuse dilation of small bowel with fluid levels, with transition point likely in the pelvis where there is peritoneal metastatic disease. No evidence of bowel necrosis or perforation  Peritoneum: Diffuse peritoneal metastases coating in the pelvic peritoneal recesses. Small ascites.  Vascular: No acute abnormality.  OSSEOUS: No acute abnormalities.  IMPRESSION: 1. 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 2. Unchanged positioning of the distal colonic stent, including erosion into the colonic wall. Large volume of desiccated stool present above the stent. 3. Extensive hepatic metastatic disease. The largest metastasis in the left lobe has decreased from 07/23/2014.   Electronically Signed   By: Monte Fantasia M.D.   On: 08/26/2014 02:00   Dg Abd 1 View  08/26/2014   CLINICAL DATA:  Metastatic colon cancer. Followup bowel obstruction.  EXAM: ABDOMEN - 1 VIEW  COMPARISON:  08/25/2014.  FINDINGS: Position of the distal colonic stent is unchanged from 08/24/2014. Again noted are multiple dilated loops of small bowel consistent with bowel  obstruction. The degree of bowel distention is not significantly improved from previous exam.  IMPRESSION: 1. No change in small bowel obstruction pattern.   Electronically Signed   By: Kerby Moors M.D.   On: 08/26/2014 09:22   Ct Abdomen Pelvis W Contrast  09/15/2014   CLINICAL DATA:  59 year old female with metastatic rectal cancer status post recent extensive abdominal surgery including resection of omental metastases, ovarian metastases, supracervical hysterectomy, multifocal small and large bowel resection and reanastomosis, and descending colostomy. Persistent nausea and vomiting. Subsequent encounter.  EXAM: CT ABDOMEN AND PELVIS WITH CONTRAST  TECHNIQUE: Multidetector CT imaging of the abdomen and pelvis was performed using the standard protocol following bolus administration of intravenous contrast.  CONTRAST:  45mL OMNIPAQUE IOHEXOL 300 MG/ML SOLN, 138mL OMNIPAQUE IOHEXOL 300 MG/ML SOLN  COMPARISON:  Acute abdominal series 08/30/14 and earlier. Preoperative CT Abdomen and Pelvis 08/25/2014  FINDINGS: New since the prior CT right lower lobe tree-in-bud and irregular peri-bronchovascular opacity (series 4, image 22). No pleural or pericardial effusion. Elsewhere the lung bases appear stable.  No acute osseous abnormality identified.  Interval distal large bowel resection along with the uterus and ovaries. Blind ending rectal stump with adjacent and right lower quadrant percutaneous postoperative drain. Presacral mild presacral stranding and trace fluid. No definite residual tumor identified in the pelvis.  Small volume of gas in the urinary bladder (series 2, image 83). No bladder catheter at this time.  There is also DN decubitus subcutaneous stranding which is new around the coccyx greater on the left of midline (series 2, image 81). No bony erosion identified.  Left lower abdomen descending colostomy with no adverse features. Upstream there is a mixture of dilute contrast and retained stool. There  are  2 bowel anastomoses in the right abdomen with somewhat patulous appearing but contrast opacified intervening colon. Stomach and duodenum appear within normal limits. No dilated small bowel. There are several pockets of fluid in the distal small bowel mesentery (arrows on series 2, image 74). These are not definitely rim enhancing.  Metastatic disease to the liver re- identified with the largest liver metastases measuring 5-6 cm diameter. When compared to the noncontrast recent comparison no significant changes demonstrated.  Gallbladder, spleen, pancreas and adrenal glands remain within normal limits. Kidneys are within normal limits. Distal ureters are included on delayed excretory phase images and appear normal.  No pneumoperitoneum. No free fluid in the upper abdomen. Major arterial structures in the abdomen and pelvis appear patent. Aortoiliac calcified atherosclerosis noted. The portal venous system appears patent. No lymphadenopathy identified.  Ventral abdominal wound with no adverse features.  IMPRESSION: 1. No bowel obstruction suspected status post multifocal small and large bowel resection with both primary reanastomoses and descending colostomy. Patulous appearance of colon in the right abdomen might reflect focal ileus. 2. Pockets of fluid in the distal small bowel mesentery are nonspecific. Trace presacral fluid about the right abdominal approach percutaneous postoperative drain. 3. Stable fairly extensive liver metastases. 4. New right lower lobe tree-in-bud and peri-bronchovascular opacity indicative of bronchopneumonia. No pleural effusion. Study discussed by telephone with Dr. Johney Maine On 09/15/2014 at 2241 hrs.   Electronically Signed   By: Genevie Ann M.D.   On: 09/15/2014 20:52   Dg Chest Port 1 View  09/04/2014   CLINICAL DATA:  Sepsis.  Fever, weakness and cough  EXAM: PORTABLE CHEST - 1 VIEW  COMPARISON:  09/02/2014  FINDINGS: There is a left subclavian catheter with tip in the projection of the  SVC. Left IJ catheter is also noted with tip in the SVC. The nasogastric tube is in the stomach. There is no pleural effusion identified. New opacity is identified in the right base. Left lung appears clear.  IMPRESSION: 1. New right lung base opacity.   Electronically Signed   By: Kerby Moors M.D.   On: 09/04/2014 15:16   Dg Chest Port 1 View  09/02/2014   CLINICAL DATA:  Generalized body aches.  Weakness.  Leukocytosis.  EXAM: PORTABLE CHEST - 1 VIEW  COMPARISON:  08/31/2014  FINDINGS: Support lines and tubes in appropriate position. Heart size is normal. Both lungs remain clear. No evidence of pleural effusion or pneumothorax.  IMPRESSION: No active disease.   Electronically Signed   By: Earle Gell M.D.   On: 09/02/2014 17:31   Dg Chest Port 1 View  08/31/2014   CLINICAL DATA:  Central line placement.  Initial encounter.  EXAM: PORTABLE CHEST - 1 VIEW  COMPARISON:  Chest radiograph performed 08/30/2014  FINDINGS: A left IJ line is noted ending about the distal SVC. A left-sided chest port is noted ending about the mid to distal SVC. The patient's enteric tube is seen extending below the diaphragm.  Pulmonary vascularity is at the upper limits of normal. Minimal bibasilar atelectasis is noted. No pleural effusion or pneumothorax is seen.  The cardiomediastinal silhouette is normal in size. No acute osseous abnormalities are identified.  IMPRESSION: 1. Left IJ line noted ending about the distal SVC. 2. Minimal bibasilar atelectasis noted.  Lungs otherwise clear.   Electronically Signed   By: Garald Balding M.D.   On: 08/31/2014 21:54   Dg Chest Port 1 View  08/28/2014   CLINICAL DATA:  Chest pain,  tachycardia, history of colon cancer  EXAM: PORTABLE CHEST - 1 VIEW  COMPARISON:  08/24/2014  FINDINGS: Lungs are clear.  No pleural effusion or pneumothorax.  The heart is normal in size.  Left chest port terminates at the cavoatrial junction.  IMPRESSION: No evidence of acute cardiopulmonary disease.    Electronically Signed   By: Julian Hy M.D.   On: 08/28/2014 19:54   Dg Abd 2 Views  08/29/2014   CLINICAL DATA:  Recent removal nasogastric tube for small bowel obstruction. Persistent nausea and vomiting.  EXAM: ABDOMEN - 2 VIEW  COMPARISON:  Abdominal radiographs 08/28/2014 and 08/26/2014. Acute abdominal series 08/24/2014.  FINDINGS: Prior Port-A-Cath projects over the left chest, with the distal tip projecting over the distal superior vena cava. The lung bases are clear. Heart size is normal.  There are scattered air-fluid levels within small bowel and colon on the upright view. No evidence of pneumoperitoneum. Air-fluid level is noted in the gastric fundus.  Gaseous distention of a central small bowel loop appears decreased compared to the radiographs of 08/28/2014. Small bowel now measures 2.5 cm (previously 3.8 cm in this region). Gas is seen within a bowel loop in the right lower quadrant, projecting over the right iliac bone and right sacrum; it is difficult to determine if this is dilated small bowel or colon. There is a moderate amount of stool within the colon; overall, colonic stool burden has decreased since 08/26/2014. A distal colonic stent is again noted.  IMPRESSION: Further improvement in small bowel distention since radiographs dated 08/28/2014.  Colonic stent present.  Decreasing stool burden.   Electronically Signed   By: Curlene Dolphin M.D.   On: 08/29/2014 11:27   Dg Abd 2 Views  08/28/2014   CLINICAL DATA:  Abdominal discomfort and distension. Small bowel obstruction.  EXAM: ABDOMEN - 2 VIEW  COMPARISON:  08/26/2014  FINDINGS: Stable position of the colonic stent in the mid pelvis. Slight improvement in the small bowel distension compared to 08/26/2014. NG tube in the proximal stomach. Minimal change in the residual colonic stool burden. No acute osseous finding.  IMPRESSION: Slight improvement in small bowel distension compatible with resolving obstruction.   Electronically  Signed   By: Jerilynn Mages.  Shick M.D.   On: 08/28/2014 08:37   Dg Abd Acute W/chest  08/30/2014   CLINICAL DATA:  Follow-up small bowel obstruction. Intermittent abdominal pain with nausea and vomiting.  EXAM: DG ABDOMEN ACUTE W/ 1V CHEST  COMPARISON:  08/29/2014  FINDINGS: Large stool burden throughout the colon. Continued mildly prominent mid abdominal small bowel loops with scattered air-fluid levels. No free air organomegaly. Presumed colonic stent in the pelvis.  Left Port-A-Cath remains in place, unchanged. Heart is normal size. Mild hyperinflation of the lungs without focal opacity or effusion.  IMPRESSION: Continued mild prominence of mid abdominal small bowel loops with scattered air-fluid levels. Large stool burden throughout the colon. No real change since prior study.   Electronically Signed   By: Rolm Baptise M.D.   On: 08/30/2014 10:34   Dg Abd Acute W/chest  08/24/2014   CLINICAL DATA:  Chemotherapy for colon cancer. Dehydration. Diffuse pain.  EXAM: DG ABDOMEN ACUTE W/ 1V CHEST  COMPARISON:  Abdominal CT 07/23/2014  FINDINGS: Chronic retention of stool above a sigmoid colon metallic stent. No evidence of high-grade obstruction or change from previous. No concerning intra-abdominal mass effect or calcification. Lung bases are clear.  IMPRESSION: Chronic retention of stool above of the patient's sigmoid colon stent. The  stool appears desiccated and is likely resistant to passing through the stent. No high-grade obstruction or progression since 07/23/2014.   Electronically Signed   By: Monte Fantasia M.D.   On: 08/24/2014 06:51    Microbiology: No results found for this or any previous visit (from the past 240 hour(s)).   Labs: Basic Metabolic Panel:  Recent Labs Lab 09/17/14 0400 09/18/14 0605 09/19/14 0903 09/20/14 0430 09/21/14 0555  NA 136 133* 136 137 139  K 4.1 4.2 4.1 3.7 3.6  CL 102 103 101 102 103  CO2 26 27 26 28 27   GLUCOSE 114* 102* 99 91 95  BUN 19 28* 26* 16 12   CREATININE 0.37* 0.41* 0.41* 0.43* 0.43*  CALCIUM 8.5* 8.2* 8.1* 8.4* 8.6*  MG  --  2.1  --   --  1.8  PHOS  --  3.8  --   --  3.8   Liver Function Tests:  Recent Labs Lab 09/18/14 0605 09/21/14 0555  AST 15 18  ALT 12* 12*  ALKPHOS 137* 131*  BILITOT 0.2* 0.5  PROT 5.9* 6.3*  ALBUMIN 2.4* 2.4*   No results for input(s): LIPASE, AMYLASE in the last 168 hours. No results for input(s): AMMONIA in the last 168 hours. CBC:  Recent Labs Lab 09/15/14 0355 09/18/14 0605  WBC 7.0 9.0  NEUTROABS  --  5.9  HGB 7.7* 8.2*  HCT 25.1* 26.9*  MCV 88.1 88.2  PLT 217 261   Cardiac Enzymes: No results for input(s): CKTOTAL, CKMB, CKMBINDEX, TROPONINI in the last 168 hours. BNP: BNP (last 3 results) No results for input(s): BNP in the last 8760 hours.  ProBNP (last 3 results) No results for input(s): PROBNP in the last 8760 hours.  CBG:  Recent Labs Lab 09/20/14 1251 09/20/14 1730 09/20/14 2234 09/21/14 0428 09/21/14 1231  GLUCAP 96 78 85 96 116*       Signed:  Andres Vest K  [facility] 09/21/2014, 7:35 PM

## 2014-09-21 NOTE — Discharge Instructions (Signed)
Ostomy Support Information  Yes, Theresia Majors heard that people get along just fine with only one of their eyes, or one of their lungs, or one of their kidneys. But you also know that you have only one intestine and only one bladder, and that leaves you feeling awfully empty, both physically and emotionally: You think no other people go around without part of their intestine with the ends of their intestines sticking out through their abdominal walls.  Well, you are wrong! There are nearly three quarters of a million people in the [place] who have an ostomy; people who have had surgery to remove all or part of their colons or bladders. There is even a national association, the [place] [employer] [place] with over 350 local affiliated support groups that are organized by volunteers who provide peer support and counseling. Sydney Morrison has a toll free telephone num-ber, ([phone] and an educational,  interactive website, www.ostomy.org   An ostomy is an opening in the belly (abdominal wall) made by surgery. Ostomates are people who have had this procedure. The opening (stoma) allows the kidney or bowel to discharge waste. An external pouch covers the stoma to collect waste. Pouches are are a simple bag and are odor free. Different companies have disposable or reusable pouches to fit one's lifestyle. An ostomy can either be temporary or permanent.  THERE ARE THREE MAIN TYPES OF OSTOMIES  Colostomy. A colostomy is a surgically created opening in the large intestine (colon).  Ileostomy. An ileostomy is a surgically created opening in the small intestine.  Urostomy. A urostomy is a surgically created opening to divert urine away from the bladder. FREQUENTLY ASKED QUESTIONS   Why havent you met any of these folks who have an ostomy?  Well, maybe you have! You just did not recognize them because an ostomy doesn't show. It can be kept secret if you wish. Why, maybe some of your best friends, office associates  or neighbors have an ostomy ... you never can tell.   People facing ostomy surgery have many quality-of-life questions like:  Will you bulge? Smell? Make noises? Will you feel waste leaving your body? Will you be a captive of the toilet? Will you starve? Be a social outcast? Get/stay married? Have babies? Easily bathe, go swimming, bend over?  OK, lets look at what you can expect:  Will you bulge?  Remember, without part of the intestine or bladder, and its contents, you should have a flatter tummy than before. You can expect to wear, with little exception, what you wore before surgery ... and this in-cludes tight clothing and bathing suits.  Will you smell?  Today, thanks to modern odor proof pouching systems, you can walk into an ostomy support group meeting and not smell anything that is foul or offensive. And, for those with an ileostomy or colostomy who are concerned about odor when emptying their pouch, there are in-pouch deodorants that can be used to eliminate any waste odors that may exist.  Will you make noises?  Everyone produces gas, especially if they are an air-swallower. But intestinal sounds that occur from time to time are no differ-ent than a gurgling tummy, and quite often your clothing will muffle any sounds.   Will you feel the waste discharges?  For those with a colostomy or ileostomy there might be a slight pressure when waste leaves your body, but understand that the intestines have no nerve endings, so there will be no unpleasant sensations. Those with a urostomy will  probably be unaware of any kidney drainage.  Will you be a captive of the toilet?  Immediately post-op you will spend more time in the bathroom than you will after your body recovers from surgery. Every person is different, but on average those with an ileostomy or urostomy may empty their pouches 4 to 6 times a day; a little  less if you have a colostomy. The average wear time between pouch system changes is 3  to 5 days and the changing process should take less than 30 minutes.  Will I need to be on a special diet? Most people return to their normal diet when they have recovered from surgery. Be sure to chew your food well, eat a well-balanced diet and drink plenty of fluids. If you experience problems with a certain food, wait a couple of weeks and try it again. Will there be odor and noises? Pouching systems are designed to be odor-proof or odor-resistant. There are deodorants that can be used in the pouch. Medications are also available to help reduce odor. Limit gas-producing foods and carbonated beverages. You will experience less gas and fewer noises as you heal from surgery. How much time will it take to care for my ostomy? At first, you may spend a lot of time learning about your ostomy and how to take care of it. As you become more comfortable and skilled at changing the pouching system, it will take very little time to care for it.  Will I be able to return to work? People with ostomies can perform most jobs. As soon as you have healed from surgery, you should be able to return to work. Heavy lifting (more than 10 pounds) may be discouraged.  What about intimacy? Sexual relationships and intimacy are important and fulfilling aspects of your life. They should continue after ostomy surgery. Intimacy-related concerns should be discussed openly between you and your partner.  Can I wear regular clothing? You do not need to wear special clothing. Ostomy pouches are fairly flat and barely noticeable. Elastic undergarments will not hurt the stoma or prevent the ostomy from functioning.  Can I participate in sports? An ostomy should not limit your involvement in sports. Many people with ostomies are runners, skiers, swimmers or participate in other active lifestyles. Talk with your caregiver first before doing heavy physical activity.  Will you starve?  Not if you follow doctors orders at each stage of  your post-op adjustment. There is no such thing as an ostomy diet. Some people with an ostomy will be able to eat and tolerate anything; others may find diffi-culty with some foods. Each person is an individual and must determine, by trial, what is best for them. A good practice for all is to drink plenty of water.  Will you be a social outcast?  Have you met anyone who has an ostomy and is a social outcast? Why should you be the first? Only your attitude and self image will effect how you are treated. No confi-dent person is an Occupational psychologist.   PROFESSIONAL HELP  Resources are available if you need help or have questions about your ostomy.    Specially trained nurses called Wound, Ostomy Continence Nurses (WOCN) are available for consultation in most major medical centers.   Consider getting an ostomy consult with Sydney Morrison at [facility] to help troubleshoot stoma pouch fittings and other issues with your ostomy: [phone]   The [place] (UOA) is a group made up of many  local chapters throughout the [place]. These local groups hold meetings and provide support to prospective and existing ostomates. They sponsor educational events and have qualified visitors to make personal or telephone visits. Contact the UOA for the chapter nearest you and for other educational publications.  More detailed information can be found in Colostomy Guide, a publication of the [employer] (UOA). Contact UOA at 1-702-169-5172 or visit their web site at [email or web address] The website contains links to other sites, suppliers and resources. Document Released: 03/20/2003 Document Revised: 06/09/2011 Document Reviewed: 07/19/2008 [facility] Patient Information 2013 [place].  Managing Pain  Pain after surgery or related to activity is often due to strain/injury to muscle, tendon, nerves and/or incisions.  This pain is usually short-term and will improve in a few  months.   Many people find it helpful to do the following things TOGETHER to help speed the process of healing and to get back to regular activity more quickly:  1. Avoid heavy physical activity at first a. No lifting greater than 20 pounds at first, then increase to lifting as tolerated over the next few weeks b. Do not push through the pain.  Listen to your body and avoid positions and maneuvers than reproduce the pain.  Wait a few days before trying something more intense c. Walking is okay as tolerated, but go slowly and stop when getting sore.  If you can walk 30 minutes without stopping or pain, you can try more intense activity (running, jogging, aerobics, cycling, swimming, treadmill, sex, sports, weightlifting, etc ) d. Remember: If it hurts to do it, then dont do it!  2. Take Anti-inflammatory medication a. Choose ONE of the following over-the-counter medications: i.            Acetaminophen 551m tabs (Tylenol) 1-2 pills with every meal and just before bedtime (avoid if you have liver problems) ii.            Naproxen 2247mtabs (ex. Aleve) 1-2 pills twice a day (avoid if you have kidney, stomach, IBD, or bleeding problems) iii. Ibuprofen 20077mabs (ex. Advil, Motrin) 3-4 pills with every meal and just before bedtime (avoid if you have kidney, stomach, IBD, or bleeding problems) b. Take with food/snack around the clock for 1-2 weeks i. This helps the muscle and nerve tissues become less irritable and calm down faster  3. Use a Heating pad or Ice/Cold Pack a. 4-6 times a day b. May use warm bath/hottub  or showers  4. Try Gentle Massage and/or Stretching  a. at the area of pain many times a day b. stop if you feel pain - do not overdo it  Try these steps together to help you body heal faster and avoid making things get worse.  Doing just one of these things may not be enough.    If you are not getting better after two weeks or are noticing you are getting worse, contact our  office for further advice; we may need to re-evaluate you & see what other things we can do to help.  GETTING TO GOOD BOWEL HEALTH. Irregular bowel habits such as constipation and diarrhea can lead to many problems over time.  Having one soft bowel movement a day is the most important way to prevent further problems.  The anorectal canal is designed to handle stretching and feces to safely manage our ability to get rid of solid waste (feces, poop, stool) out of our body.  BUT, hard constipated stools can act  like ripping concrete bricks and diarrhea can be a burning fire to this very sensitive area of our body, causing inflamed hemorrhoids, anal fissures, increasing risk is perirectal abscesses, abdominal pain/bloating, an making irritable bowel worse.     The goal: ONE SOFT BOWEL MOVEMENT A DAY!  To have soft, regular bowel movements:   Drink at least 8 tall glasses of water a day.    Take plenty of fiber.  Fiber is the undigested part of plant food that passes into the colon, acting s natures broom to encourage bowel motility and movement.  Fiber can absorb and hold large amounts of water. This results in a larger, bulkier stool, which is soft and easier to pass. Work gradually over several weeks up to 6 servings a day of fiber (25g a day even more if needed) in the form of: o Vegetables -- Root (potatoes, carrots, turnips), leafy green (lettuce, salad greens, celery, spinach), or cooked high residue (cabbage, broccoli, etc) o Fruit -- Fresh (unpeeled skin & pulp), Dried (prunes, apricots, cherries, etc ),  or stewed ( applesauce)  o Whole grain breads, pasta, etc (whole wheat)  o Bran cereals   Bulking Agents -- This type of water-retaining fiber generally is easily obtained each day by one of the following:  o Psyllium bran -- The psyllium plant is remarkable because its ground seeds can retain so much water. This product is available as Metamucil, Konsyl, Effersyllium, Per Diem Fiber, or the  less expensive generic preparation in drug and health food stores. Although labeled a laxative, it really is not a laxative.  o Methylcellulose -- This is another fiber derived from wood which also retains water. It is available as Citrucel. o Polyethylene Glycol - and artificial fiber commonly called Miralax or Glycolax.  It is helpful for people with gassy or bloated feelings with regular fiber o Flax Seed - a less gassy fiber than psyllium  No reading or other relaxing activity while on the toilet. If bowel movements take longer than 5 minutes, you are too constipated  AVOID CONSTIPATION.  High fiber and water intake usually takes care of this.  Sometimes a laxative is needed to stimulate more frequent bowel movements, but   Laxatives are not a good long-term solution as it can wear the colon out. o Osmotics (Milk of Magnesia, Fleets phosphosoda, Magnesium citrate, MiraLax, GoLytely) are safer than  o Stimulants (Senokot, Castor Oil, Dulcolax, Ex Lax)    o Do not take laxatives for more than 7days in a row.   IF SEVERELY CONSTIPATED, try a Bowel Retraining Program: o Do not use laxatives.  o Eat a diet high in roughage, such as bran cereals and leafy vegetables.  o Drink six (6) ounces of prune or apricot juice each morning.  o Eat two (2) large servings of stewed fruit each day.  o Take one (1) heaping tablespoon of a psyllium-based bulking agent twice a day. Use sugar-free sweetener when possible to avoid excessive calories.  o Eat a normal breakfast.  o Set aside 15 minutes after breakfast to sit on the toilet, but do not strain to have a bowel movement.  o If you do not have a bowel movement by the third day, use an enema and repeat the above steps.   Controlling diarrhea o Switch to liquids and simpler foods for a few days to avoid stressing your intestines further. o Avoid dairy products (especially milk & ice cream) for a short time.  The intestines often  can lose the ability to  digest lactose when stressed. o Avoid foods that cause gassiness or bloating.  Typical foods include beans and other legumes, cabbage, broccoli, and dairy foods.  Every person has some sensitivity to other foods, so listen to our body and avoid those foods that trigger problems for you. o Adding fiber (Citrucel, Metamucil, psyllium, Miralax) gradually can help thicken stools by absorbing excess fluid and retrain the intestines to act more normally.  Slowly increase the dose over a few weeks.  Too much fiber too soon can backfire and cause cramping & bloating. o Probiotics (such as active yogurt, Align, etc) may help repopulate the intestines and colon with normal bacteria and calm down a sensitive digestive tract.  Most studies show it to be of mild help, though, and such products can be costly. o Medicines: - Bismuth subsalicylate (ex. Kayopectate, Pepto Bismol) every 30 minutes for up to 6 doses can help control diarrhea.  Avoid if pregnant. - Loperamide (Immodium) can slow down diarrhea.  Start with two tablets (65m total) first and then try one tablet every 6 hours.  Avoid if you are having fevers or severe pain.  If you are not better or start feeling worse, stop all medicines and call your doctor for advice o Call your doctor if you are getting worse or not better.  Sometimes further testing (cultures, endoscopy, X-ray studies, bloodwork, etc) may be needed to help diagnose and treat the cause of the diarrhea.  Colorectal Cancer Colorectal cancer is an abnormal growth of tissue (tumor) in the colon or rectum that is cancerous (malignant). Unlike noncancerous (benign) tumors, malignant tumors can spread to other parts of your body. The colon is the large bowel or large intestine. The rectum is the last several inches of the colon.  RISK FACTORS The exact cause of colorectal cancer is unknown. However, the following factors may increase your chances of getting colorectal cancer:   Age older than 512 years.   Abnormal growths (polyps) on the inner wall of the colon or rectum.   Diabetes.   African American race.   Family history of hereditary nonpolyposis colorectal cancer. This condition is caused by changes in the genes that are responsible for repairing mismatched DNA.   Personal history of cancer. A person who has already had colorectal cancer may develop it a second time. Also, women with a history of ovarian, uterine, or breast cancer are at a somewhat higher risk of developing colorectal cancer.  Certain hereditary conditions.  Eating a diet that is high in fat (especially animal fat) and low in fiber, fruits, and vegetables.  Sedentary lifestyle.  Inflammatory bowel disease, including ulcerative colitis and Crohn's disease.   Smoking.   Excessive alcohol use.  SYMPTOMS Early colorectal cancer often does not cause symptoms. As the cancer grows, symptoms may include:   Changes in bowel habits.  Diarrhea.   Constipation.   Feeling like the bowel does not empty completely after a bowel movement.   Blood in the stool.   Stools that are narrower than usual.   Abdominal discomfort, pain, bloating, fullness, or cramps.  Frequent gas pain.   Unexplained weight loss.   Constant tiredness.   Nausea and vomiting.  DIAGNOSIS  Your health care provider will ask about your medical history. He or she may also perform a number of procedures, such as:   A physical exam.  A digital rectal exam.  A fecal occult blood test.  A barium enema.  Blood tests.   X-rays.   Imaging tests, such as CT scans or MRIs.   Taking a tissue sample (biopsy) from your colon or rectum to look for cancer cells.   A sigmoidoscopy to view the inside of the last part of your colon.   A colonoscopy to view the inside of your entire colon.   An endorectal ultrasound to see how deep a rectal tumor has grown and whether the cancer has spread to lymph nodes or  other nearby tissues.  Your cancer will be staged to determine its severity and extent. Staging is a careful attempt to find out the size of the tumor, whether the cancer has spread, and if so, to what parts of the body. You may need to have more tests to determine the stage of your cancer. The test results will help determine what treatment plan is best for you.   Stage 0. The cancer is found only in the innermost lining of the colon or rectum.   Stage I. The cancer has grown into the inner wall of the colon or rectum. The cancer has not yet reached the outer wall of the colon.   Stage II. The cancer extends more deeply into or through the wall of the colon or rectum. It may have invaded nearby tissue, but cancer cells have not spread to the lymph nodes.   Stage III. The cancer has spread to nearby lymph nodes but not to other parts of the body.   Stage IV. The cancer has spread to other parts of the body, such as the liver or lungs.  Your health care provider may tell you the detailed stage of your cancer, which includes both a number and a letter.  TREATMENT  Depending on the type and stage, colorectal cancer may be treated with surgery, radiation therapy, chemotherapy, targeted therapy, or radiofrequency ablation. Some people have a combination of these therapies. Surgery may be done to remove the polyps from your colon. In early stages, your health care provider may be able to do this during a colonoscopy. In later stages, surgery may be done to remove part of your colon.  HOME CARE INSTRUCTIONS   Take medicines only as directed by your health care provider.   Maintain a healthy diet.   Consider joining a support group. This may help you learn to cope with the stress of having colorectal cancer.   Seek advice to help you manage treatment of side effects.   Keep all follow-up visits as directed by your health care provider.   Inform your cancer specialist if you are  admitted to the hospital.  SEEK MEDICAL CARE IF:  Your diarrhea or constipation does not go away.   Your bowel habits change.  You have increased abdominal pain.   You notice new fatigue or weakness.  You lose weight. Document Released: 03/17/2005 Document Revised: 08/01/2013 Document Reviewed: 09/09/2012 [facility] Patient Information 2015 [place], [place]. This information is not intended to replace advice given to you by your health care provider. Make sure you discuss any questions you have with your health care provider.   Do not drive or operate heavy machinery while under the influence of sedating medications including your pain medications  [facility], [place] [phone]  OPEN ABDOMINAL SURGERY: POST OP INSTRUCTIONS  Always review your discharge instruction sheet given to you by the facility where your surgery was performed.  IF YOU HAVE DISABILITY OR FAMILY LEAVE FORMS, YOU  MUST BRING THEM TO THE OFFICE FOR PROCESSING.  PLEASE DO NOT GIVE THEM TO YOUR DOCTOR.  1. A prescription for pain medication may be given to you upon discharge.  Take your pain medication as prescribed, if needed.  If narcotic pain medicine is not needed, then you may take acetaminophen (Tylenol) or ibuprofen (Advil) as needed. 2. Take your usually prescribed medications unless otherwise directed. 3. If you need a refill on your pain medication, please contact your pharmacy. They will contact our office to request authorization.  Prescriptions will not be filled after 5pm or on week-ends. 4. You should follow a light diet the first few days after arrival home, such as soup and crackers, pudding, etc.unless your doctor has advised otherwise. A high-fiber, low fat diet can be resumed as tolerated.   Be sure to include lots of fluids daily. Most patients will experience some swelling and bruising on the chest and neck area.  Ice packs will help.  Swelling and bruising can take several days  to resolve 5. Most patients will experience some swelling and bruising in the area of the incision. Ice pack will help. Swelling and bruising can take several days to resolve..  6. It is common to experience some constipation if taking pain medication after surgery.  Increasing fluid intake and taking a stool softener will usually help or prevent this problem from occurring.  A mild laxative (Milk of Magnesia or Miralax) should be taken according to package directions if there are no bowel movements after 48 hours. 7.  You may have steri-strips (small skin tapes) in place directly over the incision.  These strips should be left on the skin for 7-10 days.  If your surgeon used skin glue on the incision, you may shower in 24 hours.  The glue will flake off over the next 2-3 weeks.  Any sutures or staples will be removed at the office during your follow-up visit. You may find that a light gauze bandage over your incision may keep your staples from being rubbed or pulled. You may shower and replace the bandage daily. 8. ACTIVITIES:  You may resume regular (light) daily activities beginning the next day--such as daily self-care, walking, climbing stairs--gradually increasing activities as tolerated.  You may have sexual intercourse when it is comfortable.  Refrain from any heavy lifting or straining until approved by your doctor. a. You may drive when you no longer are taking prescription pain medication, you can comfortably wear a seatbelt, and you can safely maneuver your car and apply brakes b. Return to Work: ___________________________________ 54. You should see your doctor in the office for a follow-up appointment approximately two weeks after your surgery.  Make sure that you call for this appointment within a day or two after you arrive home to insure a convenient appointment time. OTHER INSTRUCTIONS:   _____________________________________________________________ _____________________________________________________________  WHEN TO CALL YOUR DOCTOR: 1. Fever over 101.0 2. Inability to urinate 3. Nausea and/or vomiting 4. Extreme swelling or bruising 5. Continued bleeding from incision. 6. Increased pain, redness, or drainage from the incision. 7. Difficulty swallowing or breathing 8. Muscle cramping or spasms. 9. Numbness or tingling in hands or feet or around lips.  The clinic staff is available to answer your questions during regular business hours.  Please dont hesitate to call and ask to speak to one of the nurses if you have concerns.  For further questions, please visit [email or web address]  Dressing Change twice a day to sacral A dressing is a  material placed over wounds. It keeps the wound clean, dry, and protected from further injury. This provides an environment that favors wound healing.  BEFORE YOU BEGIN  Get your supplies together. Things you may need include:  Saline solution.  Flexible gauze dressing.  Medicated cream.  Tape.  Gloves.  Abdominal dressing pads.  Gauze squares.  Plastic bags.  Take pain medicine 30 minutes before the dressing change if you need it.  Take a shower before you do the first dressing change of the day. Use plastic wrap or a plastic bag to prevent the dressing from getting wet. REMOVING YOUR OLD DRESSING   Wash your hands with soap and water. Dry your hands with a clean towel.  Put on your gloves.  Remove any tape.  Carefully remove the old dressing. If the dressing sticks, you may dampen it with warm water to loosen it, or follow your caregiver's specific directions.  Remove any gauze or packing tape that is in your wound.  Take off your gloves.  Put the gloves, tape, gauze, or any packing tape into a plastic bag. CHANGING YOUR DRESSING  Open the supplies.  Take the cap off the saline  solution.  Open the gauze package so that the gauze remains on the inside of the package.  Put on your gloves.  Clean your wound as told by your caregiver.  If you have been told to keep your wound dry, follow those instructions.  Your caregiver may tell you to do one or more of the following:  Pick up the gauze. Pour the saline solution over the gauze. Squeeze out the extra saline solution.  Put medicated cream or other medicine on your wound if you have been told to do so.  Put the solution soaked gauze only in your wound, not on the skin around it.  Pack your wound loosely or as told by your caregiver.  Put dry gauze on your wound.  Put abdominal dressing pads over the dry gauze if your wet gauze soaks through.  Tape the abdominal dressing pads in place so they will not fall off. Do not wrap the tape completely around the affected part (arm, leg, abdomen).  Wrap the dressing pads with a flexible gauze dressing to secure it in place.  Take off your gloves. Put them in the plastic bag with the old dressing. Tie the bag shut and throw it away.  Keep the dressing clean and dry until your next dressing change.  Wash your hands. SEEK MEDICAL CARE IF:  Your skin around the wound looks red.  Your wound feels more tender or sore.  You see pus in the wound.  Your wound smells bad.  You have a fever.  Your skin around the wound has a rash that itches and burns.  You see black or yellow skin in your wound that was not there before.  You feel nauseous, throw up, and feel very tired. Document Released: 04/24/2004 Document Revised: 06/09/2011 Document Reviewed: 01/27/2011 [facility] Patient Information 2015 [place], [place]. This information is not intended to replace advice given to you by your health care provider. Make sure you discuss any questions you have with your health care provider.

## 2014-09-21 NOTE — Progress Notes (Signed)
Patient ID: Sydney Morrison, female   DOB: 1955/06/11, 59 y.o.   MRN: [id number] 21 Days Post-Op  Subjective: Pt feels well today.  Ate all of her solid breakfast.  No nausea with this.  Moving her bowels well in her bag.  Objective: Vital signs in last 24 hours: Temp:  [98.1 F (36.7 C)-98.7 F (37.1 C)] 98.1 F (36.7 C) (06/23 0434) Pulse Rate:  [62-77] 62 (06/23 0434) Resp:  [16-18] 16 (06/23 0434) BP: (102-132)/(50-65) 102/50 mmHg (06/23 0434) SpO2:  [99 %] 99 % (06/23 0434) Weight:  [46.9 kg (103 lb 6.3 oz)] 46.9 kg (103 lb 6.3 oz) (06/23 0434) Last BM Date: 09/18/14 (small amount of stool in bag this am)  Intake/Output from previous day: 06/22 0701 - 06/23 0700 In: 1667.9 [P.O.:360; I.V.:1307.9] Out: -  Intake/Output this shift:    PE: Abd: soft, midline wound almost healed, ostomy is working well, +BS Skin: decubitus wound is cleaning up well.  Some beefy red tissue visible as base of the wound.  There is still a good portion of fibrin present, but looks way better!!  Lab Results:  No results for input(s): WBC, HGB, HCT, PLT in the last 72 hours. BMET  Recent Labs  09/20/14 0430 09/21/14 0555  NA 137 139  K 3.7 3.6  CL 102 103  CO2 28 27  GLUCOSE 91 95  BUN 16 12  CREATININE 0.43* 0.43*  CALCIUM 8.4* 8.6*   PT/INR No results for input(s): LABPROT, INR in the last 72 hours. CMP     Component Value Date/Time   NA 139 09/21/2014 0555   NA 140 08/16/2014 1037   K 3.6 09/21/2014 0555   K 4.2 08/16/2014 1037   CL 103 09/21/2014 0555   CO2 27 09/21/2014 0555   CO2 28 08/16/2014 1037   GLUCOSE 95 09/21/2014 0555   GLUCOSE 92 08/16/2014 1037   BUN 12 09/21/2014 0555   BUN 23.1 08/16/2014 1037   CREATININE 0.43* 09/21/2014 0555   CREATININE 0.7 08/16/2014 1037   CALCIUM 8.6* 09/21/2014 0555   CALCIUM 9.5 08/16/2014 1037   PROT 6.3* 09/21/2014 0555   PROT 7.5 08/16/2014 1037   ALBUMIN 2.4* 09/21/2014 0555   ALBUMIN 3.8 08/16/2014 1037   AST 18 09/21/2014  0555   AST 21 08/16/2014 1037   ALT 12* 09/21/2014 0555   ALT 15 08/16/2014 1037   ALKPHOS 131* 09/21/2014 0555   ALKPHOS 143 08/16/2014 1037   BILITOT 0.5 09/21/2014 0555   BILITOT 0.28 08/16/2014 1037   GFRNONAA >60 09/21/2014 0555   GFRAA >60 09/21/2014 0555   Lipase     Component Value Date/Time   LIPASE 30 08/24/2014 0510       Studies/Results: No results found.  Anti-infectives: Anti-infectives    Start     Dose/Rate Route Frequency Ordered Stop   09/01/14 1200  imipenem-cilastatin (PRIMAXIN) 250 mg in sodium chloride 0.9 % 100 mL IVPB  Status:  Discontinued     250 mg 200 mL/hr over 30 Minutes Intravenous 4 times per day 09/01/14 1051 09/06/14 1759   08/31/14 1030  clindamycin (CLEOCIN) 900 mg, gentamicin (GARAMYCIN) 240 mg in sodium chloride 0.9 % 1,000 mL for intraperitoneal lavage  Status:  Discontinued      Intraperitoneal To Surgery 08/31/14 1023 08/31/14 1513   08/31/14 0600  clindamycin (CLEOCIN) IVPB 900 mg     900 mg 100 mL/hr over 30 Minutes Intravenous 60 min pre-op 08/30/14 1218 08/31/14 0944   08/31/14 0600  [  MAR Hold]  gentamicin (GARAMYCIN) 220 mg in dextrose 5 % 100 mL IVPB     (MAR Hold since 08/31/14 1010)   5 mg/kg  44.1 kg 105.5 mL/hr over 60 Minutes Intravenous 60 min pre-op 08/30/14 1218 08/31/14 1005   08/30/14 2100  imipenem-cilastatin (PRIMAXIN) 250 mg in sodium chloride 0.9 % 100 mL IVPB  Status:  Discontinued     250 mg 200 mL/hr over 30 Minutes Intravenous 3 times per day 08/30/14 1739 09/01/14 1051   08/25/14 1800  imipenem-cilastatin (PRIMAXIN) 250 mg in sodium chloride 0.9 % 100 mL IVPB  Status:  Discontinued     250 mg 200 mL/hr over 30 Minutes Intravenous Every 6 hours 08/25/14 1716 08/30/14 1739   08/25/14 1800  metroNIDAZOLE (FLAGYL) IVPB 500 mg  Status:  Discontinued     500 mg 100 mL/hr over 60 Minutes Intravenous Every 8 hours 08/25/14 1716 08/27/14 1003       Assessment/Plan  1.POD21, EXPLORATORY LAPAROTOMY, SMALL  BOWEL RESECTION, low anterior resection with COLOSTOMY, APPENDECTOMY, HYSTERECTOMY SUPRACERVICAL ABDOMINAL BILATERAL TUBES AND OVARIES, TRANSVERSE COLON RESECTION, DEBULKING OF PERITONEUM - 09/06/2014 - S. Gross For Small Bowel Obstruction, Rectosigmoid cancer with perforation, Pelvic carcinomatosis, Omental central caking/carcinoimatosis, Liver metatasis -doing well off TNA and on solid diet.  She has Ensure Clear at home that she likes and states she will drink that at home as well.  Patient otherwise surgically stable at this point for DC home. -follow up with Dr. Johney Morrison in 2-3 weeks  2. Has liver mets Sees Dr. Benay Morrison for oncology 3. Post op ileus -resolved 4. Malnutrition  --off TNA 5. Anemia -  stable 6. Sacral decubitus ulcer -looks much better with just 2 days of hydrotherapy.  Will let her get her session today and then arrange for BID dressing changes at home and follow up with the wound care clinic for chronic management  7. DVT prophylaxis - heparin/SCDs   LOS: 27 days    Sydney Morrison E 09/21/2014, 9:48 AM Pager: [phone]

## 2014-09-21 NOTE — Progress Notes (Signed)
Physical Therapy Wound Treatment Patient Details  Name: Sydney Morrison MRN: [id number] Date of Birth: March 20, 1956  Today's Date: 09/21/2014 Time: 1125-1201 Time Calculation (min): 36 min  Subjective  Subjective: Pt is a 59 year old female s/p ex lap with colostomy on 6/2 with PMH of rectal cancer with liver metastasis, s/p rectal stent placement in 06/2014, on chemotherapy.  Pt presents with unstageable sacral pressure ulcer. Patient and Family Stated Goals: agreeable to wound care for pressure ulcer Prior Treatments: dressing changes per RN, using shower head to wound  Pain Score:  Pt only with pain a couple times during selective debridement, adjusted tx to pain tolerance.  Wound Assessment                                                  Pressure Ulcer 09/19/14 Unstageable - Full thickness tissue loss in which the base of the ulcer is covered by slough (yellow, tan, gray, green or brown) and/or eschar (tan, brown or black) in the wound bed. HYDRO - sacrum/L buttock unstageable ulcer (Active)  Dressing Type Moist to dry;Foam 09/21/2014 12:00 PM  Dressing Changed 09/21/2014 12:00 PM  Dressing Change Frequency Every other day 09/21/2014 12:00 PM  State of Healing Early/partial granulation 09/21/2014 12:00 PM  Site / Wound Assessment Yellow;Granulation tissue;Red 09/21/2014 12:00 PM  % Wound base Red or Granulating 50% 09/21/2014 12:00 PM  % Wound base Yellow 50% 09/21/2014 12:00 PM  Peri-wound Assessment Erythema (non-blanchable);Pink 09/21/2014 12:00 PM  Wound Length (cm) 6.5 cm 09/19/2014  2:00 PM  Wound Width (cm) 4.5 cm 09/19/2014  2:00 PM  Margins Unattached edges (unapproximated) 09/21/2014 12:00 PM  Drainage Amount Moderate 09/21/2014 12:00 PM  Drainage Description Purulent;Odor 09/21/2014 12:00 PM  Treatment Debridement (Selective);Hydrotherapy (Pulse lavage) 09/21/2014 12:00 PM                                                    Hydrotherapy Pulsed lavage therapy -  wound location: sarcal L buttock pressure ulcer Pulsed Lavage with Suction (psi): 8 psi Pulsed Lavage with Suction - Normal Saline Used: 1000 mL Pulsed Lavage Tip: Tip with splash shield Selective Debridement Selective Debridement - Location: sacral L buttock ulcer Selective Debridement - Tools Used: Scissors;Forceps Selective Debridement - Tissue Removed: yellow, tan, white, nonviable tissue, slough   Wound Assessment and Plan  Wound Therapy - Assess/Plan/Recommendations Wound Therapy - Clinical Statement: Pt would benefit from hydrotherapy to promote wound healing by removing necrotic tissue and promoting healing environment.  Wound Therapy - Functional Problem List: rectal cancer with liver mets, chemotherapy Factors Delaying/Impairing Wound Healing: Multiple medical problems Hydrotherapy Plan: Debridement;Dressing change;Patient/family education;Pulsatile lavage with suction Wound Therapy - Frequency: 6X / week Wound Therapy - Follow Up Recommendations: [place] Wound Plan: Perform hydrotherapy to sacral pressure ulcer and redress wound including santyl to assist with debriding, removing necrotic tissue and promoting healing environment  Wound Therapy Goals- Improve the function of patient's integumentary system by progressing the wound(s) through the phases of wound healing (inflammation - proliferation - remodeling) by: Decrease Necrotic Tissue to: 40% Decrease Necrotic Tissue - Progress: Progressing toward goal Increase Granulation Tissue to: 60% Increase Granulation Tissue - Progress: Progressing toward goal Improve  Drainage Characteristics: Min Improve Drainage Characteristics - Progress: Progressing toward goal Goals/treatment plan/discharge plan were made with and agreed upon by patient/family: Yes Time For Goal Achievement: 2 weeks Wound Therapy - Potential for Goals: Good  Goals will be updated until maximal potential achieved or discharge criteria met.  Discharge  criteria: when goals achieved, discharge from hospital, MD decision/surgical intervention, no progress towards goals, refusal/missing three consecutive treatments without notification or medical reason.  GP     Trenisha Lafavor,KATHrine E 09/21/2014, 12:55 PM Carmelia Bake, PT, DPT 09/21/2014 Pager: [phone]

## 2014-09-21 NOTE — [facility] Oncology Navigator Note (Signed)
Per Dr. Benay Spice : Patient wants to see him in [place] again before making her final decision to transfer to [facility]. He will see her on 10/04/14 at 12:00 for 1 hour f/u. POF sent

## 2014-09-21 NOTE — Progress Notes (Signed)
Porta cath deaccessed. Flushed with 10cc NS followed by Heparin 5ml (100u/ml). No bleeding to site, band aid to site for comfort. Karlene Southard M 

## 2014-09-22 ENCOUNTER — Telehealth: Payer: Self-pay | Admitting: Oncology

## 2014-09-22 NOTE — Telephone Encounter (Signed)
s.w. pt and advised on 7.6 appt.Marland KitchenMarland KitchenMarland KitchenMarland Kitchenpt did not want to keep the appointment and has decided that she wants to go to [facility] because it is closer

## 2014-09-26 ENCOUNTER — Other Ambulatory Visit: Payer: 59

## 2014-09-26 ENCOUNTER — Ambulatory Visit: Payer: 59 | Admitting: Hematology and Oncology

## 2014-09-28 ENCOUNTER — Telehealth: Payer: Self-pay | Admitting: *Deleted

## 2014-09-28 NOTE — Telephone Encounter (Signed)
Patient called and she was recently discharged from [place].  She reports that she and Dr. Benay Spice had a conversation about transfering her oncology care and treatments to [facility] because she has a bed sore and it would be such a shorter drive and much more convenient for her.  She had not heard anything and so called [facility] and they do not have referral yet.  Let her know that I will let Dr.Sherrill know.  She sees her surgeon on 10/11/14 and would not need treatment before then.  Her call back is [phone].

## 2014-09-28 NOTE — Telephone Encounter (Signed)
I thought she planned to f/u here. Ok to go to [facility]  Please refer to Dr. [name]

## 2014-09-29 ENCOUNTER — Telehealth: Payer: Self-pay | Admitting: *Deleted

## 2014-09-29 DIAGNOSIS — C787 Secondary malignant neoplasm of liver and intrahepatic bile duct: Principal | ICD-10-CM

## 2014-09-29 DIAGNOSIS — C189 Malignant neoplasm of colon, unspecified: Secondary | ICD-10-CM

## 2014-09-29 NOTE — Telephone Encounter (Signed)
Oncology Nurse Navigator Documentation  Oncology Nurse Navigator Flowsheets 09/29/2014  Navigator Encounter Type Telephone  Patient Visit Type -  Treatment Phase -  Barriers/Navigation Needs Coordination of care: confirmed she wants to transfer to [facility].  Referral sent and in basket message to Dr. [name].  Support Groups/Services -  Time Spent with Patient [place] reports she is feeling well and eating "like a pig". Wished her well.

## 2014-10-03 ENCOUNTER — Encounter: Payer: Self-pay | Admitting: Oncology

## 2014-10-03 NOTE — Progress Notes (Signed)
I faxed notes/labs to [facility]  [phone]

## 2014-10-04 ENCOUNTER — Ambulatory Visit: Payer: 59 | Admitting: Oncology

## 2014-10-12 ENCOUNTER — Ambulatory Visit (HOSPITAL_COMMUNITY): Payer: 59 | Admitting: Hematology & Oncology

## 2014-10-12 ENCOUNTER — Encounter (HOSPITAL_COMMUNITY): Payer: Commercial Managed Care - HMO | Attending: Hematology & Oncology | Admitting: Oncology

## 2014-10-12 VITALS — BP 122/68 | HR 74 | Temp 98.1°F | Resp 14 | Wt 100.4 lb

## 2014-10-12 DIAGNOSIS — L89329 Pressure ulcer of left buttock, unspecified stage: Secondary | ICD-10-CM

## 2014-10-12 DIAGNOSIS — C787 Secondary malignant neoplasm of liver and intrahepatic bile duct: Secondary | ICD-10-CM

## 2014-10-12 DIAGNOSIS — C786 Secondary malignant neoplasm of retroperitoneum and peritoneum: Secondary | ICD-10-CM

## 2014-10-12 DIAGNOSIS — C19 Malignant neoplasm of rectosigmoid junction: Secondary | ICD-10-CM

## 2014-10-12 DIAGNOSIS — E43 Unspecified severe protein-calorie malnutrition: Secondary | ICD-10-CM

## 2014-10-12 DIAGNOSIS — C189 Malignant neoplasm of colon, unspecified: Secondary | ICD-10-CM

## 2014-10-12 NOTE — Assessment & Plan Note (Signed)
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 Michael Boston on 08/31/2014 when she was admitted with SBO.   Goals of care broached.  She is educated on her disease and the incurability of her disease given metastatic disease, despite major debulking surgery and response to systemic chemotherapy thus far.  This will be further discussed in the future.  I have called pathology to request KRAS testing.  For now, she is to focus on nutrition and bed sore healing.  Return in 2 weeks for follow-up.

## 2014-10-12 NOTE — Progress Notes (Signed)
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  INTERVAL HISTORY: Sydney Morrison 59 y.o. 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 Michael Boston on 08/31/2014 when she was admitted with SBO.   According to the Hospitalist when the patient was admitted in [place] from 08/25/2014- 09/21/2014: Sydney Morrison is an 60 y.o. female with a PMH of rectal cancer with liver metastasis, s/p rectal stent placement in 06/2014, on chemotherapy (last cycle completed 08/16/2014) under Dr. Gearldine Shown care who was admitted 08/25/2014 with ongoing nausea, vomiting and abdominal pain secondary to small bowel obstruction.She underwent exploratory laparotomy on 08/31/2014 with lower anterior rectosigmoid resection with colostomy. She is also s/p appendectomy, hysterectomy, and salpingoophorectomy, and debulking of peritoneum. Postoperatively she developed ileus requiring nutritional support with TPN. Hospital course was complicated by the development of sepsis due to possible pneumonia, treated with flagyl and Primaxin. All antibiotics stopped 09/06/2014. Ultimately, the patient was weaned off TPN and she was able to tolerate a regular diet.  Chart reviewed  Sydney Morrison reports that she was told by her previous medical oncologist, Dr. Benay Morrison, that 50 % of patients respond to treatment, others do not.  It is clear that she has not had a goals of care discussion in the setting of metastic disease.  She is under the impression  that if she is in the 50% that respond, then she is curable.  Unfortunately, a discussion took place today clearing that up and briefly discussing goals of care.  We will definitely discuss this further in the future.  She reports that she has home PT and they have been extremely helpful with helping her regain her strength and getting her back on her feet.  As a result, her pressure sore is improving.  She developed her bed sore while in the hospital as mentioned above.    She reports that her mood is good most days.  We have encouraged her to maintain her attitude and her spirits.    She admits that her weight was 135 lbs at the time she was diagnosed.  Her caregiver is her husband who is out of work on [employer].  He plans on using all of his FMLA time and then take early retirement.   Past Medical History  Diagnosis Date  . Crohn's disease   . Chronic headache   . Cancer   . 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     is allergic to avelox; codeine; dextromethorphan; erythromycin; penicillins; percocet; reglan; zofran; and suprep.  Current Outpatient Prescriptions on File Prior to Visit  Medication Sig Dispense Refill  . collagenase (SANTYL) ointment Apply topically daily. Apply to  affected area 15 g 0  . cromolyn (NASALCROM) 5.2 MG/ACT nasal spray Place 1 spray into both nostrils at bedtime.    . Cyanocobalamin  (VITAMIN B-12 CR) 1500 MCG TBCR Take 1 tablet by mouth daily.    Marland Kitchen lidocaine-prilocaine (EMLA) cream Apply 1 application topically as needed. Apply to portacath site 1-2 hours prior to use 30 g 3  . nicotine (NICODERM CQ - DOSED IN MG/24 HOURS) 14 mg/24hr patch Place 14 mg onto the skin daily.    . protein supplement (UNJURY VANILLA) POWD Take 7 g (2 oz total) by mouth 3 (three) times daily.    Marland Kitchen zolpidem (AMBIEN) 5 MG tablet Take 1 tablet (5 mg total) by mouth at bedtime as needed for sleep. 30 tablet 0  . acetaminophen (TYLENOL) 325 MG tablet Take 325 mg by mouth every 6 (six) hours as needed for fever or headache (headahce).     Marland Kitchen dexamethasone (DECADRON) 4 MG tablet Take 1 tablet (4 mg total) by mouth 2 (two) times daily. For 2 days. Begin day of pump disconnect. (Patient not taking: Reported on 10/12/2014) 8 tablet 1  . Fluorouracil (ADRUCIL IV) Inject into the vein every 14 (fourteen) days.    . hydrOXYzine (ATARAX/VISTARIL) 25 MG tablet Take 1 tablet (25 mg total) by mouth every 6 (six) hours as needed for itching. (Patient not taking: Reported on 10/12/2014) 30 tablet 0  . leucovorin in dextrose 5 % 250 mL Inject into the vein every 14 (fourteen) days.    Marland Kitchen LORazepam (ATIVAN) 0.5 MG tablet Take 1 tablet (0.5 mg total) by mouth every 6 (six) hours as needed for anxiety. (Patient not taking: Reported on 10/12/2014) 30 tablet 0  . omeprazole (PRILOSEC) 40 MG capsule Take 40 mg by mouth daily as needed (nausea and vomiting).    . ondansetron (ZOFRAN ODT) 4 MG disintegrating tablet 14m ODT q4 hours prn nausea/vomit (Patient not taking: Reported on 10/12/2014) 20 tablet 0  . oxaliplatin in dextrose 5 % 500 mL Inject into the vein every 14 (fourteen) days.    .Marland KitchenoxyCODONE (OXY IR/ROXICODONE) 5 MG immediate release tablet Take 1-2 tablets (5-10 mg total) by mouth every 4 (four) hours as needed for moderate pain, severe pain or breakthrough pain. (Patient not taking: Reported on 10/12/2014) 20 tablet 0   No  current facility-administered medications on file prior to visit.    Past Surgical History  Procedure Laterality Date  . Breast surgery    . Uterine fibroid surgery    . Colonoscopy w/ biopsies  06/29/2014    DR HUNG  . Flexible sigmoidoscopy N/A 06/30/2014    Procedure: FLEXIBLE SIGMOIDOSCOPY;  Surgeon: PCarol Ada MD;  [facility];  Service: Endoscopy;  Laterality: N/A;  . Colonic stent placement N/A 06/30/2014    Procedure: COLONIC STENT PLACEMENT;  Surgeon: PCarol Ada MD;  [facility];  Service: Endoscopy;  Laterality: N/A;  with fluro   . Portacath placement N/A 07/04/2014    Procedure: POWER PORT PLACEMENT;  Surgeon: DAlphonsa Overall MD;  [facility]  Service: General;  Laterality: N/A;  . Laparotomy N/A 08/31/2014    Procedure: EXPLORATORY LAPAROTOMY;  Surgeon: SMichael Boston MD;  [facility];  Service: General;  Laterality: N/A;  . Bowel resection N/A 08/31/2014    Procedure: SMALL BOWEL RESECTION;  Surgeon: SMichael Boston MD;  [facility];  Service: General;  Laterality: N/A;  . Colostomy N/A 08/31/2014    Procedure: low anterior resection with COLOSTOMY;  Surgeon:  Michael Boston, MD;  [facility];  Service: General;  Laterality: N/A;  . Appendectomy  08/31/2014    Procedure: APPENDECTOMY;  Surgeon: Michael Boston, MD;  [facility];  Service: General;;  . Supracervical abdominal hysterectomy  08/31/2014    Procedure: HYSTERECTOMY SUPRACERVICAL ABDOMINAL BILATERAL TUBES AND OVARIES;  Surgeon: Michael Boston, MD;  [facility];  Service: General;;  . Transverse colon resection  08/31/2014    Procedure: TRANSVERSE COLON RESECTION;  Surgeon: Michael Boston, MD;  [facility];  Service: General;;  . Debulking  08/31/2014    Procedure: DEBULKING OF PERITONEUM;  Surgeon: Michael Boston, MD;  [facility];  Service: General;;    Denies any headaches, dizziness, double vision, fevers, chills, night sweats, nausea, vomiting, diarrhea, constipation, chest  pain, heart palpitations, shortness of breath, blood in stool, black tarry stool, urinary pain, urinary burning, urinary frequency, hematuria.   PHYSICAL EXAMINATION  ECOG PERFORMANCE STATUS: 2 - Symptomatic, <50% confined to bed  Filed Vitals:   10/12/14 1041  BP: 122/68  Pulse: 74  Temp: 98.1 F (36.7 C)  Resp: 14    GENERAL:alert, no distress, cachectic, cooperative, smiling and accompanied by her husband, Sydney Morrison. SKIN: skin color, texture, turgor are normal, positive for: left buttock bed sore, healing. Healthy granulation tissue, edges pink, no signs of infection, deepest area approx 1/4 inch HEAD: Normocephalic, No masses, lesions, tenderness or abnormalities EYES: normal, PERRLA, EOMI, Conjunctiva are pink and non-injected EARS: External ears normal OROPHARYNX:lips, buccal mucosa, and tongue normal and mucous membranes are moist  NECK: supple, trachea midline LYMPH:  not examined BREAST:not examined LUNGS: clear to auscultation and percussion HEART: regular rate & rhythm, no murmurs, no gallops, S1 normal and S2 normal ABDOMEN:abdomen soft, non-tender, normal bowel sounds and ostomy noted BACK: Back symmetric, no curvature. EXTREMITIES:less then 2 second capillary refill, no joint deformities, effusion, or inflammation, no skin discoloration, no cyanosis  NEURO: alert & oriented x 3 with fluent speech, no focal motor/sensory deficits    LABORATORY DATA: CBC    Component Value Date/Time   WBC 9.0 09/18/2014 0605   WBC 5.5 08/16/2014 1037   RBC 3.05* 09/18/2014 0605   RBC 2.71* 09/12/2014 0515   RBC 3.61* 08/16/2014 1037   HGB 8.2* 09/18/2014 0605   HGB 9.6* 08/16/2014 1037   HCT 26.9* 09/18/2014 0605   HCT 30.9* 08/16/2014 1037   PLT 261 09/18/2014 0605   PLT 167 08/16/2014 1037   MCV 88.2 09/18/2014 0605   MCV 85.6 08/16/2014 1037   MCH 26.9 09/18/2014 0605   MCH 26.6 08/16/2014 1037   MCHC 30.5 09/18/2014 0605   MCHC 31.1* 08/16/2014 1037   RDW 17.6*  09/18/2014 0605   RDW 16.1* 08/16/2014 1037   LYMPHSABS 1.6 09/18/2014 0605   LYMPHSABS 1.2 08/16/2014 1037   MONOABS 1.0 09/18/2014 0605   MONOABS 0.9 08/16/2014 1037   EOSABS 0.4 09/18/2014 0605   EOSABS 0.2 08/16/2014 1037   BASOSABS 0.0 09/18/2014 0605   BASOSABS 0.0 08/16/2014 1037      Chemistry      Component Value Date/Time   NA 139 09/21/2014 0555   NA 140 08/16/2014 1037   K 3.6 09/21/2014 0555   K 4.2 08/16/2014 1037   CL 103 09/21/2014 0555   CO2 27 09/21/2014 0555   CO2 28 08/16/2014 1037   BUN 12 09/21/2014 0555   BUN 23.1 08/16/2014 1037   CREATININE 0.43* 09/21/2014 0555   CREATININE 0.7 08/16/2014 1037  Component Value Date/Time   CALCIUM 8.6* 09/21/2014 0555   CALCIUM 9.5 08/16/2014 1037   ALKPHOS 131* 09/21/2014 0555   ALKPHOS 143 08/16/2014 1037   AST 18 09/21/2014 0555   AST 21 08/16/2014 1037   ALT 12* 09/21/2014 0555   ALT 15 08/16/2014 1037   BILITOT 0.5 09/21/2014 0555   BILITOT 0.28 08/16/2014 1037     Lab Results  Component Value Date   CEA 54.7* 06/29/2014    PENDING LABS:   RADIOGRAPHIC STUDIES:  Ct Abdomen Pelvis W Contrast  09/15/2014   CLINICAL DATA:  59 year old female with metastatic rectal cancer status post recent extensive abdominal surgery including resection of omental metastases, ovarian metastases, supracervical hysterectomy, multifocal small and large bowel resection and reanastomosis, and descending colostomy. Persistent nausea and vomiting. Subsequent encounter.  EXAM: CT ABDOMEN AND PELVIS WITH CONTRAST  TECHNIQUE: Multidetector CT imaging of the abdomen and pelvis was performed using the standard protocol following bolus administration of intravenous contrast.  CONTRAST:  21m OMNIPAQUE IOHEXOL 300 MG/ML SOLN, 1054mOMNIPAQUE IOHEXOL 300 MG/ML SOLN  COMPARISON:  Acute abdominal series 08/30/14 and earlier. Preoperative CT Abdomen and Pelvis 08/25/2014  FINDINGS: New since the prior CT right lower lobe tree-in-bud and  irregular peri-bronchovascular opacity (series 4, image 22). No pleural or pericardial effusion. Elsewhere the lung bases appear stable.  No acute osseous abnormality identified.  Interval distal large bowel resection along with the uterus and ovaries. Blind ending rectal stump with adjacent and right lower quadrant percutaneous postoperative drain. Presacral mild presacral stranding and trace fluid. No definite residual tumor identified in the pelvis.  Small volume of gas in the urinary bladder (series 2, image 83). No bladder catheter at this time.  There is also DN decubitus subcutaneous stranding which is new around the coccyx greater on the left of midline (series 2, image 81). No bony erosion identified.  Left lower abdomen descending colostomy with no adverse features. Upstream there is a mixture of dilute contrast and retained stool. There are 2 bowel anastomoses in the right abdomen with somewhat patulous appearing but contrast opacified intervening colon. Stomach and duodenum appear within normal limits. No dilated small bowel. There are several pockets of fluid in the distal small bowel mesentery (arrows on series 2, image 74). These are not definitely rim enhancing.  Metastatic disease to the liver re- identified with the largest liver metastases measuring 5-6 cm diameter. When compared to the noncontrast recent comparison no significant changes demonstrated.  Gallbladder, spleen, pancreas and adrenal glands remain within normal limits. Kidneys are within normal limits. Distal ureters are included on delayed excretory phase images and appear normal.  No pneumoperitoneum. No free fluid in the upper abdomen. Major arterial structures in the abdomen and pelvis appear patent. Aortoiliac calcified atherosclerosis noted. The portal venous system appears patent. No lymphadenopathy identified.  Ventral abdominal wound with no adverse features.  IMPRESSION: 1. No bowel obstruction suspected status post multifocal  small and large bowel resection with both primary reanastomoses and descending colostomy. Patulous appearance of colon in the right abdomen might reflect focal ileus. 2. Pockets of fluid in the distal small bowel mesentery are nonspecific. Trace presacral fluid about the right abdominal approach percutaneous postoperative drain. 3. Stable fairly extensive liver metastases. 4. New right lower lobe tree-in-bud and peri-bronchovascular opacity indicative of bronchopneumonia. No pleural effusion. Study discussed by telephone with Dr. GrJohney Mainen 09/15/2014 at 2241 hrs.   Electronically Signed   By: H Genevie Ann.D.   On: 09/15/2014 20:52  PATHOLOGY:  Diagnosis 1. Soft tissue mass, biopsy, peritoneal mass dome of bladder - METASTATIC ADENOCARCINOMA. 2. Appendix, Incidental - METASTATIC ADENOCARCINOMA. 3. Small intestine, resection, ileum - METASTATIC ADENOCARCINOMA. - RESECTION MARGINS ARE NEGATIVE. 4. Colon, segmental resection, with omentum, transverse - METASTATIC ADENOCARCINOMA INVOLVING COLON AND OMENTUM. - RESECTION MARGINS ARE NEGATIVE. 5. Soft tissue, biopsy, right pelvic sidewall nodule - METASTATIC ADENOCARCINOMA. 6. Soft tissue mass, biopsy, anterior peritoneum mass near dome - METASTATIC ADENOCARCINOMA. 7. Colon, segmental resection for tumor, rectum - INVASIVE ADENOCARCINOMA, MODERATELY DIFFERENTIATED - TUMOR INVADES THROUGH SEROSA, SEE COMMENT. - PERFORATION WITH VISIBLE STENT. - LYMPHOVASCULAR AND PERINEURAL INVASION IS PRESENT. - DISTAL AND PROXIMAL RESECTION MARGINS ARE NEGATIVE, SEE COMMENT FOR RADIAL MARGIN. - METASTATIC CARCINOMA IN FOUR OF FOURTEEN LYMPH NODES (4/14). - SEROSAL METASTATIC NODULES AND SATELLITE NODULES PRESENT. - SEE ONCOLOGY TABLE AND COMMENT.    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 Michael Boston on 08/31/2014 when she was admitted with SBO.   Goals of care broached.  She is educated on her disease and the incurability of her disease given metastatic disease, despite major debulking surgery and response to systemic chemotherapy thus far.  This will be further discussed in the future.  I have called pathology to request KRAS testing.  For now, she is to focus on nutrition and bed sore healing.  Return in 2 weeks for follow-up.    THERAPY PLAN:  Return in 2 weeks.  All questions were answered. The patient knows to call the clinic with any problems, questions or concerns. We can certainly see the patient much sooner if necessary.  Patient and plan discussed with Dr. Ancil Linsey and she is in agreement with the aforementioned.   This note is electronically signed by: Doy Mince 10/12/2014 4:51 PM  As above.  Patient seen and examined.  Her pressure sore was carefully examined and is healing,  Her spirits are good. She was up and walking into the clinic.  We addressed prognosis, goals of care.  We discussed restarting her therapy with a goal being at the next two week visit.  She is gradually improving on her oral intake and we discussed the importance of nutrition. I do believe she will improve enough to continue forward with therapy. KRAS testing requested.  Will see her back in 2 weeks. Donald Pore MD

## 2014-10-12 NOTE — Patient Instructions (Signed)
[place] at [facility] Discharge Instructions  RECOMMENDATIONS MADE BY THE CONSULTANT AND ANY TEST RESULTS WILL BE SENT TO YOUR REFERRING PHYSICIAN.  Exam and discussion by Robynn Pane, PA-C and Dr. [name] Will do KRAS testing  Will request nutritional consult  Follow-up in 2 weeks to see Dr. [name].  Thank you for choosing [place] at [facility] to provide your oncology and hematology care.  To afford each patient quality time with our provider, please arrive at least 15 minutes before your scheduled appointment time.    You need to re-schedule your appointment should you arrive 10 or more minutes late.  We strive to give you quality time with our providers, and arriving late affects you and other patients whose appointments are after yours.  Also, if you no show three or more times for appointments you may be dismissed from the clinic at the providers discretion.     Again, thank you for choosing [facility].  Our hope is that these requests will decrease the amount of time that you wait before being seen by our physicians.       _____________________________________________________________  Should you have questions after your visit to [facility], please contact our office at [phone] between the hours of 8:30 a.m. and 4:30 p.m.  Voicemails left after 4:30 p.m. will not be returned until the following business day.  For prescription refill requests, have your pharmacy contact our office.

## 2014-10-13 ENCOUNTER — Encounter: Payer: Self-pay | Admitting: Dietician

## 2014-10-13 NOTE — Progress Notes (Signed)
Consulted by MD/PA to assess pt's nutritional status  Contacted Pt by phone  Wt Readings from Last 10 Encounters:  10/12/14 100 lb 6.4 oz (45.541 kg)  09/21/14 103 lb 6.3 oz (46.9 kg)  08/25/14 110 lb (49.896 kg)  08/24/14 109 lb 12.8 oz (49.805 kg)  08/24/14 110 lb (49.896 kg)  08/16/14 110 lb 11.2 oz (50.213 kg)  08/02/14 113 lb (51.256 kg)  07/23/14 114 lb 5 oz (51.852 kg)  07/14/14 118 lb 14.4 oz (53.933 kg)  06/29/14 120 lb (54.432 kg)   Patient weight has fallen by 20 lbs in 3.5 months.  Hx: 59 y.o. female with a PMH of rectal cancer with liver metastasis, s/p rectal stent placement in 06/2014, on chemotherapy (last cycle completed 08/16/2014). Admitted 08/25/2014 with ongoing nausea, vomiting and abdominal pain secondary to small bowel obstruction.On 08/31/14 She underwent extensive bowel surgery w/ SBR, Colon resection, omentectomy, salpingo-oophorectomy, hysterectomy with colostomy.   Postoperatively  developed ileus requiring nutritional support with TPN. Hospital course was complicated by the development of sepsis. Ultimately, the patient was weaned off TPN and she was able to tolerate a regular diet.  She has been seen recently by many dietitians. During her long recent admission, in which she was on TPN, she had dx of severe protein calorie malnutrition. Note: She was monitored for refeeding syndrome during this admission. She also has a bed sore right now, unsure of current stage.   [facility] RD has seen patient prior to being admitted. Per that encounter pt has had severe n/v s/p chemo sessions. Pt also noted to have crohns. RD had provided initial information to patient.  Per discussion with pt:  Pt was extremely happy and optimistic when I spoke with her. She reports her appetite is excellent. She is Eating 6x a day and also having 1 ensure clear and protein bar.  She denies n/v/d at this point.   Even though she is eating well, she says she is not gaining  weight, just maintaining and is slightly disappointed. I went through some of her food choices and found she is eating fruits/vegetables at each meal. Educated pt that these will fill her up and may not be the best choices for someone trying to gain weight. She said she has had some trouble passing effluent from her colostomy and her surgeon reccommended a fiber supplement as she is far enough out from surgery to have fiber. She knows the fiber supplement will fill her up anyway so she just includes fruits/veg at each meal. Talked about hydration as also being a factor for maintaining function of her colostomy.   Asking about her crohns, she reports that what she thought was her crohns acting up was actually the cancer and at this point she reports no food intolerances after the surgery.  We went over more weight gaining tips and proper food choices. Told her about the Ensure program and she would like to have a case at her next appointment. She drank 2 ensures a day for years when she was suffering from crohns and does enjoy them.   I asked her about her tolerance to her first chemo session as I saw she had severe n/v. She reports her surgeon had mentioned the bowel blockage had been there for quite some time. He believes that her extreme n/v may have been influenced by the SBO and not solely the chemo. I told her I was hopeful of this as well.   She does have a  bedsore. She reports her MD said it is healing well. She is also doing well with PT and only need 6/8 of her covered sessions.   Pt seems to be doing amazing at this point in time considering all she has been through. Will continue to monitor her weight as she continues to heal and likely resumes treatment.  Mailed my contact info, coupons, and handouts titled "Soft and moist high protein foods", "Dehydration", "Making the Most of Each Bite" as well as an Ensure recipe book, "Nutrition Counts ".   Burtis Junes RD, LDN Nutrition Pager:  [phone] 10/13/2014 10:39 AM

## 2014-10-14 ENCOUNTER — Encounter (HOSPITAL_COMMUNITY): Payer: Self-pay | Admitting: Oncology

## 2014-10-16 ENCOUNTER — Other Ambulatory Visit (HOSPITAL_COMMUNITY)
Admission: RE | Admit: 2014-10-16 | Discharge: 2014-10-16 | Disposition: A | Discharge status: Home / Self Care | Payer: 59 | Source: Ambulatory Visit | Attending: Oncology | Admitting: Oncology

## 2014-10-16 DIAGNOSIS — C787 Secondary malignant neoplasm of liver and intrahepatic bile duct: Secondary | ICD-10-CM | POA: Insufficient documentation

## 2014-10-16 DIAGNOSIS — C189 Malignant neoplasm of colon, unspecified: Secondary | ICD-10-CM | POA: Diagnosis present

## 2014-10-24 ENCOUNTER — Encounter (HOSPITAL_BASED_OUTPATIENT_CLINIC_OR_DEPARTMENT_OTHER): Payer: 59 | Attending: General Surgery

## 2014-10-24 DIAGNOSIS — R634 Abnormal weight loss: Secondary | ICD-10-CM | POA: Diagnosis not present

## 2014-10-24 DIAGNOSIS — G629 Polyneuropathy, unspecified: Secondary | ICD-10-CM | POA: Diagnosis not present

## 2014-10-24 DIAGNOSIS — K5090 Crohn's disease, unspecified, without complications: Secondary | ICD-10-CM | POA: Insufficient documentation

## 2014-10-24 DIAGNOSIS — Z809 Family history of malignant neoplasm, unspecified: Secondary | ICD-10-CM | POA: Diagnosis not present

## 2014-10-24 DIAGNOSIS — Z87891 Personal history of nicotine dependence: Secondary | ICD-10-CM | POA: Insufficient documentation

## 2014-10-24 DIAGNOSIS — C189 Malignant neoplasm of colon, unspecified: Secondary | ICD-10-CM | POA: Insufficient documentation

## 2014-10-24 DIAGNOSIS — C787 Secondary malignant neoplasm of liver and intrahepatic bile duct: Secondary | ICD-10-CM | POA: Insufficient documentation

## 2014-10-24 DIAGNOSIS — L89153 Pressure ulcer of sacral region, stage 3: Secondary | ICD-10-CM | POA: Diagnosis present

## 2014-10-24 DIAGNOSIS — D649 Anemia, unspecified: Secondary | ICD-10-CM | POA: Insufficient documentation

## 2014-10-24 DIAGNOSIS — Z9221 Personal history of antineoplastic chemotherapy: Secondary | ICD-10-CM | POA: Insufficient documentation

## 2014-10-24 DIAGNOSIS — Z681 Body mass index (BMI) 19 or less, adult: Secondary | ICD-10-CM | POA: Insufficient documentation

## 2014-10-26 ENCOUNTER — Encounter: Payer: Self-pay | Admitting: Dietician

## 2014-10-26 ENCOUNTER — Encounter (HOSPITAL_COMMUNITY): Payer: Self-pay

## 2014-10-26 ENCOUNTER — Encounter (HOSPITAL_BASED_OUTPATIENT_CLINIC_OR_DEPARTMENT_OTHER): Payer: Commercial Managed Care - HMO | Admitting: Hematology & Oncology

## 2014-10-26 ENCOUNTER — Encounter (HOSPITAL_COMMUNITY): Payer: Self-pay | Admitting: Hematology & Oncology

## 2014-10-26 VITALS — BP 118/61 | HR 80 | Temp 98.1°F | Resp 14 | Wt 99.6 lb

## 2014-10-26 DIAGNOSIS — L8990 Pressure ulcer of unspecified site, unspecified stage: Secondary | ICD-10-CM

## 2014-10-26 DIAGNOSIS — C7989 Secondary malignant neoplasm of other specified sites: Secondary | ICD-10-CM

## 2014-10-26 DIAGNOSIS — C19 Malignant neoplasm of rectosigmoid junction: Secondary | ICD-10-CM | POA: Diagnosis not present

## 2014-10-26 DIAGNOSIS — R64 Cachexia: Secondary | ICD-10-CM

## 2014-10-26 DIAGNOSIS — E46 Unspecified protein-calorie malnutrition: Secondary | ICD-10-CM

## 2014-10-26 NOTE — Progress Notes (Signed)
Sydney Cahill, MD  [place] [address]  Stage IV CRC  CURRENT THERAPY: S/P 3 cycles of FOLFOX with complications resulting in hospitalization and debulking surgery.  INTERVAL HISTORY: Sydney Morrison 59 y.o. 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 Michael Boston on 08/31/2014 when she was admitted with SBO.   According to the Hospitalist when the patient was admitted in [place] from 08/25/2014- 09/21/2014:  She underwent exploratory laparotomy on 08/31/2014 with lower anterior rectosigmoid resection with colostomy. She is also s/p appendectomy, hysterectomy, and salpingoophorectomy, and debulking of peritoneum. Postoperatively she developed ileus requiring nutritional support with TPN. Hospital course was complicated by the development of sepsis due to possible pneumonia, treated with flagyl and Primaxin. All antibiotics stopped 09/06/2014. Ultimately, the patient was weaned off TPN and she was able to tolerate a regular diet.  She is here to day with her husband and in good spirits. She has had a sinus infection since Thursday night, reporting that it is hard to eat while having a fever. She saw Dr. Nevada Crane on Friday. She is feeling much better with only some lingering drainage issues today. She was cauterized on Tuesday, 7/26 at the [facility] in [place]. She mentioned being switched to different bandaging, as they were previously given the wrong ones due to medicare coverage. She is scheduled to return next Tuesday. They report her wound has healed a lot in the past week, her husband says it is a lot better than it was.   She does not want to start treatment back up next Wednesday, she feels this would be too soon with her wound.  She is still experiencing  neuropathy in her hands, mostly in her index fingers. She has difficulty opening water bottles, Ensure cans, and buttoning shirts. She no longer experiences neuropathy in her toes.  She has anxiety about restarting chemotherapy because she "felt so awful on it." She is nervous about using steroids due to previous heart palpitations while on prednisone. She does not like using ativan either, as she becomes very emotional while on it. She has pinging pains intermitantly in her port-cath, she attributes it to being so thin.   She was curious how much of her large intestine is left. We went over her surgical note together.  Past Medical History  Diagnosis Date  . Crohn's disease   . Chronic headache   . Cancer   . Nausea and vomiting 08/25/2014  . 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 [id number]; Chronic blood loss anemia; Antineoplastic chemotherapy induced anemia; Dehydration; Chemotherapy-induced neuropathy; Nausea and vomiting; SBO (small bowel obstruction); Atrial flutter; Arterial hypotension; Goals of care, counseling/discussion; Screen for STD (sexually transmitted disease); Pressure ulcer, stage 1; Postoperative fever; Leucocytosis; Protein-calorie malnutrition, severe; Tobacco abuse; Encounter for palliative care; Ileus, postoperative; Hyponatremia; Nausea & vomiting; and Intolerance, food on her problem list.     is allergic to avelox; codeine; dextromethorphan; erythromycin; penicillins; percocet; reglan; zofran; and suprep.  Current Outpatient Prescriptions on File Prior to Visit  Medication Sig Dispense Refill  . acetaminophen (TYLENOL) 325 MG tablet Take 325 mg by mouth every 6 (six) hours as needed for fever or headache (headahce).     . ALPRAZolam (XANAX) 0.5 MG tablet Take 0.5 mg by mouth  at bedtime as needed.    . cromolyn (NASALCROM) 5.2 MG/ACT nasal spray Place 1 spray into both nostrils at bedtime.    . Cyanocobalamin (VITAMIN B-12 CR) 1500 MCG TBCR  Take 1 tablet by mouth daily.    . nicotine (NICODERM CQ - DOSED IN MG/24 HOURS) 14 mg/24hr patch Place 14 mg onto the skin daily. Uses 7 mg    . protein supplement (UNJURY VANILLA) POWD Take 7 g (2 oz total) by mouth 3 (three) times daily. (Patient not taking: Reported on 11/20/2014)    . collagenase (SANTYL) ointment Apply topically daily. Apply to affected area (Patient not taking: Reported on 11/06/2014) 15 g 0  . Fluorouracil (ADRUCIL IV) Inject into the vein every 14 (fourteen) days.    . hydrOXYzine (ATARAX/VISTARIL) 25 MG tablet Take 1 tablet (25 mg total) by mouth every 6 (six) hours as needed for itching. (Patient not taking: Reported on 10/12/2014) 30 tablet 0  . leucovorin in dextrose 5 % 250 mL Inject into the vein every 14 (fourteen) days.    Marland Kitchen lidocaine-prilocaine (EMLA) cream Apply 1 application topically as needed. Apply to portacath site 1-2 hours prior to use 30 g 3  . LORazepam (ATIVAN) 0.5 MG tablet Take 1 tablet (0.5 mg total) by mouth every 6 (six) hours as needed for anxiety. (Patient not taking: Reported on 10/12/2014) 30 tablet 0  . omeprazole (PRILOSEC) 40 MG capsule Take 40 mg by mouth daily as needed (nausea and vomiting).    . ondansetron (ZOFRAN ODT) 4 MG disintegrating tablet 4mg  ODT q4 hours prn nausea/vomit (Patient not taking: Reported on 10/12/2014) 20 tablet 0  . oxaliplatin in dextrose 5 % 500 mL Inject into the vein every 14 (fourteen) days.    Marland Kitchen oxyCODONE (OXY IR/ROXICODONE) 5 MG immediate release tablet Take 1-2 tablets (5-10 mg total) by mouth every 4 (four) hours as needed for moderate pain, severe pain or breakthrough pain. (Patient not taking: Reported on 11/06/2014) 20 tablet 0  . zolpidem (AMBIEN) 5 MG tablet Take 1 tablet (5 mg total) by mouth at bedtime as needed for sleep. (Patient not taking: Reported on 10/26/2014) 30 tablet 0   No current facility-administered medications on file prior to visit.    Past Surgical History  Procedure Laterality Date  .  Breast surgery    . Uterine fibroid surgery    . Colonoscopy w/ biopsies  06/29/2014    DR HUNG  . Flexible sigmoidoscopy N/A 06/30/2014    Procedure: FLEXIBLE SIGMOIDOSCOPY;  Surgeon: Carol Ada, MD;  Location: [facility] ENDOSCOPY;  Service: Endoscopy;  Laterality: N/A;  . Colonic stent placement N/A 06/30/2014    Procedure: COLONIC STENT PLACEMENT;  Surgeon: Carol Ada, MD;  Location: [facility] ENDOSCOPY;  Service: Endoscopy;  Laterality: N/A;  with fluro   . Portacath placement N/A 07/04/2014    Procedure: POWER PORT PLACEMENT;  Surgeon: Alphonsa Overall, MD;  Location: [facility];  Service: General;  Laterality: N/A;  . Laparotomy N/A 08/31/2014    Procedure: EXPLORATORY LAPAROTOMY;  Surgeon: Michael Boston, MD;  Location: [facility];  Service: General;  Laterality: N/A;  . Bowel resection N/A 08/31/2014    Procedure: SMALL BOWEL RESECTION;  Surgeon: Michael Boston, MD;  Location: [facility];  Service: General;  Laterality: N/A;  . Colostomy N/A 08/31/2014    Procedure: low anterior resection with COLOSTOMY;  Surgeon: Michael Boston, MD;  Location: [facility];  Service: General;  Laterality: N/A;  . Appendectomy  08/31/2014    Procedure: APPENDECTOMY;  Surgeon:  Michael Boston, MD;  Location: [facility];  Service: General;;  . Supracervical abdominal hysterectomy  08/31/2014    Procedure: HYSTERECTOMY SUPRACERVICAL ABDOMINAL BILATERAL TUBES AND OVARIES;  Surgeon: Michael Boston, MD;  Location: [facility];  Service: General;;  . Transverse colon resection  08/31/2014    Procedure: TRANSVERSE COLON RESECTION;  Surgeon: Michael Boston, MD;  Location: [facility];  Service: General;;  . Debulking  08/31/2014    Procedure: DEBULKING OF PERITONEUM;  Surgeon: Michael Boston, MD;  Location: [facility];  Service: General;;    Denies any headaches, dizziness, double vision, fevers, chills, night sweats, nausea, vomiting, diarrhea, constipation, chest pain, heart palpitations, shortness of breath, blood in stool, black tarry stool, urinary pain, urinary burning, urinary  frequency, hematuria.  Positive for mild neuropathy.   PHYSICAL EXAMINATION  ECOG PERFORMANCE STATUS: 2 - Symptomatic, <50% confined to bed  Filed Vitals:   10/26/14 1030  BP: 118/61  Pulse: 80  Temp: 98.1 F (36.7 C)  Resp: 14    GENERAL:alert, no distress, cachectic, cooperative, smiling and accompanied by her husband, Sydney Morrison. SKIN: skin color, texture, turgor are normal HEAD: Normocephalic, No masses, lesions, tenderness or abnormalities EYES: normal, PERRLA, EOMI, Conjunctiva are pink and non-injected EARS: External ears normal OROPHARYNX:lips, buccal mucosa, and tongue normal and mucous membranes are moist  NECK: supple, trachea midline LYMPH:  not examined BREAST:not examined LUNGS: clear to auscultation and percussion HEART: regular rate & rhythm, no murmurs, no gallops, S1 normal and S2 normal ABDOMEN:abdomen soft, non-tender, normal bowel sounds and ostomy noted BACK: Back symmetric, no curvature. EXTREMITIES:less then 2 second capillary refill, no joint deformities, effusion, or inflammation, no skin discoloration, no cyanosis  NEURO: alert & oriented x 3 with fluent speech, no focal motor/sensory deficits    LABORATORY DATA: CBC Results for Thompson Caul (MRN [id number])   Ref. Range 10/23/2014 10:15  Ferritin Latest Ref Range: 11-307 ng/mL 8 (L)  WBC Latest Ref Range: 4.0-10.5 K/uL 4.9  RBC Latest Ref Range: 3.87-5.11 MIL/uL 4.49  Hemoglobin Latest Ref Range: 12.0-15.0 g/dL 12.2  HCT Latest Ref Range: 36.0-46.0 % 37.9  MCV Latest Ref Range: 78.0-100.0 fL 84.4  MCH Latest Ref Range: 26.0-34.0 pg 27.2  MCHC Latest Ref Range: 30.0-36.0 g/dL 32.2  RDW Latest Ref Range: 11.5-15.5 % 21.2 (H)  Platelets Latest Ref Range: 150-400 K/uL 336  Neutrophils Latest Ref Range: 43-77 % 58  Lymphocytes Latest Ref Range: 12-46 % 26  Monocytes Relative Latest Ref Range: 3-12 % 8  Eosinophil Latest Ref Range: 0-5 % 7 (H)  Basophil Latest Ref Range: 0-1 % 1  NEUT# Latest Ref  Range: 1.7-7.7 K/uL 2.9  Lymphocyte # Latest Ref Range: 0.7-4.0 K/uL 1.3  Monocyte # Latest Ref Range: 0.1-1.0 K/uL 0.4  Eosinophils Absolute Latest Ref Range: 0.0-0.7 K/uL 0.3  Basophils Absolute Latest Ref Range: 0.0-0.1 K/uL 0.1     RADIOGRAPHIC STUDIES:    PATHOLOGY:  Diagnosis 1. Soft tissue mass, biopsy, peritoneal mass dome of bladder - METASTATIC ADENOCARCINOMA. 2. Appendix, Incidental - METASTATIC ADENOCARCINOMA. 3. Small intestine, resection, ileum - METASTATIC ADENOCARCINOMA. - RESECTION MARGINS ARE NEGATIVE. 4. Colon, segmental resection, with omentum, transverse - METASTATIC ADENOCARCINOMA INVOLVING COLON AND OMENTUM. - RESECTION MARGINS ARE NEGATIVE. 5. Soft tissue, biopsy, right pelvic sidewall nodule - METASTATIC ADENOCARCINOMA. 6. Soft tissue mass, biopsy, anterior peritoneum mass near dome - METASTATIC ADENOCARCINOMA. 7. Colon, segmental resection for tumor, rectum - INVASIVE ADENOCARCINOMA, MODERATELY DIFFERENTIATED - TUMOR INVADES THROUGH SEROSA, SEE COMMENT. - PERFORATION WITH VISIBLE STENT. -  LYMPHOVASCULAR AND PERINEURAL INVASION IS PRESENT. - DISTAL AND PROXIMAL RESECTION MARGINS ARE NEGATIVE, SEE COMMENT FOR RADIAL MARGIN. - METASTATIC CARCINOMA IN FOUR OF FOURTEEN LYMPH NODES (4/14). - SEROSAL METASTATIC NODULES AND SATELLITE NODULES PRESENT. - SEE ONCOLOGY TABLE AND COMMENT.   ASSESSMENT AND PLAN:  Stage IV CRC Bowel obstruction with exploratory laparotomy on 08/31/2014 with lower anterior rectosigmoid resection with colostomy. She is also s/p appendectomy, hysterectomy, and salpingoophorectomy, and debulking of peritoneum. Pressure wound -- healing Malnutrition -- improving  We will plan to begin treatment again on Monday, 8/8. We will not add Avastin until her wound is fully healed. We will schedule for a follow up appointment one week after her next chemotherapy treatment to address tolerance. We will try the new anti-nausea medication,  Varubi, with hopefully better protection against delayed n/v. She is very limited as to what anti-nausea medications she can tolerate.   Return in 2 weeks. I have encouraged ongoing nutrition and activity.  All questions were answered. The patient knows to call the clinic with any problems, questions or concerns. We can certainly see the patient much sooner if necessary.  This document serves as a record of services personally performed by Ancil Linsey, MD. It was created on her behalf by Arlyce Harman, a trained medical scribe. The creation of this record is based on the scribe's personal observations and the provider's statements to them. This document has been checked and approved by the attending provider.  I have reviewed the above documentation for accuracy and completeness, and I agree with the above.   [name], MD 11/24/2014 11:25 AM

## 2014-10-26 NOTE — Progress Notes (Signed)
Followed up with pt when dropping off her Ensure Case  Contacted Pt by speaking to her at her appointment   Wt Readings from Last 10 Encounters:  10/12/14 100 lb 6.4 oz (45.541 kg)  09/21/14 103 lb 6.3 oz (46.9 kg)  08/25/14 110 lb (49.896 kg)  08/24/14 109 lb 12.8 oz (49.805 kg)  08/24/14 110 lb (49.896 kg)  08/16/14 110 lb 11.2 oz (50.213 kg)  08/02/14 113 lb (51.256 kg)  07/23/14 114 lb 5 oz (51.852 kg)  07/14/14 118 lb 14.4 oz (53.933 kg)  06/29/14 120 lb (54.432 kg)   Spoke had not been put in a room yet so I just briefly talked to her.   Last I spoke she was very optimistic and reportedly was eating very well. Today, she reports she had a recent sinus infection which she has been taking an antibiotic for. Because of this acute illness she has lost her appetite and she thinks she has also lost some weight.   She spoke with a wound care professional who had told her to eat 2000 kcals a day and reccommended weigh protein with mvi. I told her to eat the best she can rather than aim for a certain number. I definitely agree with the weigh powder and mvi. We briefly discussed how she could incorporate the powder into her meals without it affecting her total volume intake.   Let her and husband know to call when she needs another Ensure case or if they have any questions.    Burtis Junes RD, LDN Nutrition Pager: [phone] 10/26/2014 10:17 AM

## 2014-10-26 NOTE — Patient Instructions (Signed)
[place] [facility] Discharge Instructions  RECOMMENDATIONS MADE BY THE CONSULTANT AND ANY TEST RESULTS WILL BE SENT TO YOUR REFERRING PHYSICIAN.  Exam and discussion by Dr. [name]. Plans are to restart chemotherapy on 11/06/14. Call with any concerns   Follow-up in 8/8 with office visit an chemotherapy.   Thank you for choosing [place] at [facility] to provide your oncology and hematology care.  To afford each patient quality time with our provider, please arrive at least 15 minutes before your scheduled appointment time.    You need to re-schedule your appointment should you arrive 10 or more minutes late.  We strive to give you quality time with our providers, and arriving late affects you and other patients whose appointments are after yours.  Also, if you no show three or more times for appointments you may be dismissed from the clinic at the providers discretion.     Again, thank you for choosing [facility].  Our hope is that these requests will decrease the amount of time that you wait before being seen by our physicians.       _____________________________________________________________  Should you have questions after your visit to [facility], please contact our office at [phone] between the hours of 8:30 a.m. and 4:30 p.m.  Voicemails left after 4:30 p.m. will not be returned until the following business day.  For prescription refill requests, have your pharmacy contact our office.

## 2014-10-31 ENCOUNTER — Encounter (HOSPITAL_BASED_OUTPATIENT_CLINIC_OR_DEPARTMENT_OTHER): Payer: Commercial Managed Care - HMO | Attending: General Surgery

## 2014-10-31 DIAGNOSIS — Z9221 Personal history of antineoplastic chemotherapy: Secondary | ICD-10-CM | POA: Insufficient documentation

## 2014-10-31 DIAGNOSIS — G629 Polyneuropathy, unspecified: Secondary | ICD-10-CM | POA: Insufficient documentation

## 2014-10-31 DIAGNOSIS — C189 Malignant neoplasm of colon, unspecified: Secondary | ICD-10-CM | POA: Diagnosis not present

## 2014-10-31 DIAGNOSIS — K5090 Crohn's disease, unspecified, without complications: Secondary | ICD-10-CM | POA: Insufficient documentation

## 2014-10-31 DIAGNOSIS — C787 Secondary malignant neoplasm of liver and intrahepatic bile duct: Secondary | ICD-10-CM | POA: Insufficient documentation

## 2014-10-31 DIAGNOSIS — L89152 Pressure ulcer of sacral region, stage 2: Secondary | ICD-10-CM | POA: Insufficient documentation

## 2014-11-01 ENCOUNTER — Telehealth (HOSPITAL_COMMUNITY): Payer: Self-pay

## 2014-11-01 NOTE — Telephone Encounter (Signed)
Patient states "I've been running a little bit of a fever.  It's been 100.7, 100.8.  I was told before when I was getting chemotherapy that if my fever got over 100.5 to let someone know.  Was recently treated for sinus infection and my drainage is clear.  Dr. Benay Spice told me before that I could get a fever just because of my cancer that involves my liver."  Denies chills, sore throat, UTI symptoms, cough, etc.  Has not been treated since May.  Is taking tylenol every 4 hours and is controlling fever. Thinks she may be developing a yeast infection due to the antibiotic therapy she has just completed and plans to get something to use for that.  Instructed that if fever continues to rise, she gets shaking chills or other symptoms to go to the ED.  Verbalized understanding of instructions.

## 2014-11-06 ENCOUNTER — Encounter (HOSPITAL_BASED_OUTPATIENT_CLINIC_OR_DEPARTMENT_OTHER): Payer: Commercial Managed Care - HMO | Admitting: Hematology & Oncology

## 2014-11-06 ENCOUNTER — Encounter (HOSPITAL_COMMUNITY): Payer: Self-pay | Admitting: Hematology & Oncology

## 2014-11-06 ENCOUNTER — Encounter (HOSPITAL_COMMUNITY): Payer: Commercial Managed Care - HMO | Attending: Hematology & Oncology

## 2014-11-06 VITALS — BP 131/66 | HR 77 | Temp 98.4°F | Resp 16 | Wt 99.4 lb

## 2014-11-06 VITALS — BP 133/59 | HR 69 | Temp 98.3°F | Resp 16

## 2014-11-06 DIAGNOSIS — E876 Hypokalemia: Secondary | ICD-10-CM | POA: Diagnosis present

## 2014-11-06 DIAGNOSIS — B379 Candidiasis, unspecified: Secondary | ICD-10-CM

## 2014-11-06 DIAGNOSIS — D638 Anemia in other chronic diseases classified elsewhere: Secondary | ICD-10-CM | POA: Diagnosis not present

## 2014-11-06 DIAGNOSIS — R112 Nausea with vomiting, unspecified: Secondary | ICD-10-CM

## 2014-11-06 DIAGNOSIS — C786 Secondary malignant neoplasm of retroperitoneum and peritoneum: Secondary | ICD-10-CM | POA: Diagnosis not present

## 2014-11-06 DIAGNOSIS — C189 Malignant neoplasm of colon, unspecified: Secondary | ICD-10-CM | POA: Diagnosis present

## 2014-11-06 DIAGNOSIS — C787 Secondary malignant neoplasm of liver and intrahepatic bile duct: Secondary | ICD-10-CM

## 2014-11-06 DIAGNOSIS — Z5111 Encounter for antineoplastic chemotherapy: Secondary | ICD-10-CM

## 2014-11-06 DIAGNOSIS — C19 Malignant neoplasm of rectosigmoid junction: Secondary | ICD-10-CM

## 2014-11-06 DIAGNOSIS — R509 Fever, unspecified: Secondary | ICD-10-CM

## 2014-11-06 DIAGNOSIS — L89329 Pressure ulcer of left buttock, unspecified stage: Secondary | ICD-10-CM

## 2014-11-06 LAB — COMPREHENSIVE METABOLIC PANEL
ALBUMIN: 3.1 g/dL — AB (ref 3.5–5.0)
ALK PHOS: 422 U/L — AB (ref 38–126)
ALT: 64 U/L — ABNORMAL HIGH (ref 14–54)
ANION GAP: 10 (ref 5–15)
AST: 51 U/L — ABNORMAL HIGH (ref 15–41)
BUN: 21 mg/dL — ABNORMAL HIGH (ref 6–20)
CHLORIDE: 99 mmol/L — AB (ref 101–111)
CO2: 27 mmol/L (ref 22–32)
CREATININE: 0.51 mg/dL (ref 0.44–1.00)
Calcium: 8.8 mg/dL — ABNORMAL LOW (ref 8.9–10.3)
GFR calc Af Amer: >60 mL/min (ref >60)
GFR calc non Af Amer: >60 mL/min (ref >60)
Glucose, Bld: 119 mg/dL — ABNORMAL HIGH (ref 65–99)
POTASSIUM: 3.4 mmol/L — AB (ref 3.5–5.1)
SODIUM: 136 mmol/L (ref 135–145)
TOTAL PROTEIN: 7.2 g/dL (ref 6.5–8.1)
Total Bilirubin: 0.2 mg/dL — ABNORMAL LOW (ref 0.3–1.2)

## 2014-11-06 LAB — CBC WITH DIFFERENTIAL/PLATELET
BASOS ABS: 0 10*3/uL (ref 0.0–0.1)
BASOS PCT: 0 % (ref 0–1)
EOS ABS: 0.2 10*3/uL (ref 0.0–0.7)
EOS PCT: 3 % (ref 0–5)
HEMATOCRIT: 27.6 % — AB (ref 36.0–46.0)
Hemoglobin: 8.7 g/dL — ABNORMAL LOW (ref 12.0–15.0)
LYMPHS ABS: 1.2 10*3/uL (ref 0.7–4.0)
Lymphocytes Relative: 15 % (ref 12–46)
MCH: 28.1 pg (ref 26.0–34.0)
MCHC: 31.5 g/dL (ref 30.0–36.0)
MCV: 89 fL (ref 78.0–100.0)
Monocytes Absolute: 1 10*3/uL (ref 0.1–1.0)
Monocytes Relative: 12 % (ref 3–12)
NEUTROS ABS: 5.3 10*3/uL (ref 1.7–7.7)
Neutrophils Relative %: 70 % (ref 43–77)
Platelets: 239 10*3/uL (ref 150–400)
RBC: 3.1 MIL/uL — ABNORMAL LOW (ref 3.87–5.11)
RDW: 14.3 % (ref 11.5–15.5)
WBC: 7.7 10*3/uL (ref 4.0–10.5)

## 2014-11-06 MED ORDER — SODIUM CHLORIDE 0.9 % IJ SOLN
10.0000 mL | INTRAMUSCULAR | Status: DC | PRN
  Administered 2014-11-06: 10 mL
  Filled 2014-11-06: qty 10

## 2014-11-06 MED ORDER — DEXTROSE 5 % IV SOLN
Freq: Once | INTRAVENOUS | Status: AC
  Administered 2014-11-06: 11:00:00 via INTRAVENOUS

## 2014-11-06 MED ORDER — SODIUM CHLORIDE 0.9 % IV SOLN
Freq: Once | INTRAVENOUS | Status: DC
  Filled 2014-11-06: qty 5

## 2014-11-06 MED ORDER — FLUOROURACIL CHEMO INJECTION 5 GM/100ML
2400.0000 mg/m2 | INTRAVENOUS | Status: DC
  Administered 2014-11-06: 3500 mg via INTRAVENOUS
  Filled 2014-11-06: qty 70

## 2014-11-06 MED ORDER — FLUOROURACIL CHEMO INJECTION 2.5 GM/50ML
400.0000 mg/m2 | Freq: Once | INTRAVENOUS | Status: AC
  Administered 2014-11-06: 600 mg via INTRAVENOUS
  Filled 2014-11-06: qty 12

## 2014-11-06 MED ORDER — PALONOSETRON HCL INJECTION 0.25 MG/5ML
0.2500 mg | Freq: Once | INTRAVENOUS | Status: AC
  Administered 2014-11-06: 0.25 mg via INTRAVENOUS
  Filled 2014-11-06: qty 5

## 2014-11-06 MED ORDER — OXALIPLATIN CHEMO INJECTION 100 MG/20ML
85.0000 mg/m2 | Freq: Once | INTRAVENOUS | Status: AC
  Administered 2014-11-06: 125 mg via INTRAVENOUS
  Filled 2014-11-06: qty 25

## 2014-11-06 MED ORDER — SODIUM CHLORIDE 0.9 % IV SOLN
12.0000 mg | Freq: Once | INTRAVENOUS | Status: AC
  Administered 2014-11-06: 12 mg via INTRAVENOUS
  Filled 2014-11-06: qty 1.2

## 2014-11-06 MED ORDER — DEXTROSE 5 % IV SOLN
400.0000 mg/m2 | Freq: Once | INTRAVENOUS | Status: AC
  Administered 2014-11-06: 584 mg via INTRAVENOUS
  Filled 2014-11-06: qty 29.2

## 2014-11-06 MED ORDER — DEXAMETHASONE 4 MG PO TABS: 4.0000 mg | ORAL_TABLET | Freq: Two times a day (BID) | ORAL | Status: DC

## 2014-11-06 MED ORDER — FLUCONAZOLE 150 MG PO TABS: ORAL_TABLET | ORAL | Status: DC

## 2014-11-06 NOTE — Progress Notes (Signed)
Delphina Cahill, MD  [place] [address]  Stage IV CRC  CURRENT THERAPY: S/P 3 cycles of FOLFOX with complications resulting in hospitalization and debulking surgery.  INTERVAL HISTORY: Sydney Morrison 59 y.o. 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 Michael Boston on 08/31/2014 when she was admitted with SBO.   According to the Hospitalist when the patient was admitted in [place] from 08/25/2014- 09/21/2014: ARLAYNE LIGGINS is an 59 y.o. female with a PMH of rectal cancer with liver metastasis, s/p rectal stent placement in 06/2014, on chemotherapy (last cycle completed 08/16/2014) under Dr. Gearldine Shown care who was admitted 08/25/2014 with ongoing nausea, vomiting and abdominal pain secondary to small bowel obstruction.She underwent exploratory laparotomy on 08/31/2014 with lower anterior rectosigmoid resection with colostomy. She is also s/p appendectomy, hysterectomy, and salpingoophorectomy, and debulking of peritoneum. Postoperatively she developed ileus requiring nutritional support with TPN. Hospital course was complicated by the development of sepsis due to possible pneumonia, treated with flagyl and Primaxin. All antibiotics stopped 09/06/2014. Ultimately, the patient was weaned off TPN and she was able to tolerate a regular diet.  She is here today with her husband. Her appetite has been good. She has had a fever on and off for four days with the highest temperature reading 102.2.  She called the [place] office and was told it may be tumor fever. She has been told in the pas by Dr. Benay Spice that her fevers were probably tumor related. The fever gradually rises throughout the day, peaking at night. She takes tylenol to help, taking one at lunch and two at night. She  notes that the fever has been lowering her energy.  She has been experiencing severe pain in her liver. It is the same pain she had after her first chemotherapy treatment. She has had a cough, with sputum production that is white and clear. She denies dysuria. She did have a yeast infection though she notes that anytime she is on antibiotics she always has a yeast infection after. She says she has had a problem with yeast infections all her adult life. She has recently used Monistat OTC with improvement in her symptoms. She reports that her bed sore is almost completely healed. She was told by wound care she was OK to start chemotherapy.  Her husband has another 30 days out of work on [employer]. She does not want Neulasta and is quite adamant about that.  Past Medical History  Diagnosis Date  . Crohn's disease   . Chronic headache   . Cancer   . Nausea and vomiting 08/25/2014  . 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 [id number]; Chronic blood loss anemia; Antineoplastic chemotherapy induced anemia; Dehydration; Chemotherapy-induced neuropathy; Nausea and vomiting; SBO (small bowel obstruction); Atrial flutter; Arterial hypotension; Goals of care, counseling/discussion; Screen for STD (sexually transmitted disease); Pressure ulcer, stage 1; Postoperative fever; Leucocytosis; Protein-calorie malnutrition, severe; Tobacco abuse; Encounter for palliative care; Ileus, postoperative; Hyponatremia; Nausea & vomiting; and Intolerance, food on her problem list.     is allergic to avelox; codeine; dextromethorphan; erythromycin; penicillins; percocet; reglan; zofran; and suprep.  Current Outpatient Prescriptions on File Prior to Visit  Medication Sig Dispense Refill  . acetaminophen (TYLENOL) 325 MG tablet Take 325 mg by mouth every  6 (six) hours as needed for fever or headache (headahce).     . ALPRAZolam (XANAX) 0.5 MG tablet Take 0.5 mg by mouth at bedtime as needed.    .  cromolyn (NASALCROM) 5.2 MG/ACT nasal spray Place 1 spray into both nostrils at bedtime.    . Cyanocobalamin (VITAMIN B-12 CR) 1500 MCG TBCR Take 1 tablet by mouth daily.    . Fluorouracil (ADRUCIL IV) Inject into the vein every 14 (fourteen) days.    Marland Kitchen leucovorin in dextrose 5 % 250 mL Inject into the vein every 14 (fourteen) days.    Marland Kitchen lidocaine-prilocaine (EMLA) cream Apply 1 application topically as needed. Apply to portacath site 1-2 hours prior to use 30 g 3  . nicotine (NICODERM CQ - DOSED IN MG/24 HOURS) 14 mg/24hr patch Place 14 mg onto the skin daily. Uses 7 mg    . omeprazole (PRILOSEC) 40 MG capsule Take 40 mg by mouth daily as needed (nausea and vomiting).    Marland Kitchen oxaliplatin in dextrose 5 % 500 mL Inject into the vein every 14 (fourteen) days.    . protein supplement (UNJURY VANILLA) POWD Take 7 g (2 oz total) by mouth 3 (three) times daily.    . collagenase (SANTYL) ointment Apply topically daily. Apply to affected area (Patient not taking: Reported on 11/06/2014) 15 g 0  . hydrOXYzine (ATARAX/VISTARIL) 25 MG tablet Take 1 tablet (25 mg total) by mouth every 6 (six) hours as needed for itching. (Patient not taking: Reported on 10/12/2014) 30 tablet 0  . LORazepam (ATIVAN) 0.5 MG tablet Take 1 tablet (0.5 mg total) by mouth every 6 (six) hours as needed for anxiety. (Patient not taking: Reported on 10/12/2014) 30 tablet 0  . ondansetron (ZOFRAN ODT) 4 MG disintegrating tablet 4mg  ODT q4 hours prn nausea/vomit (Patient not taking: Reported on 10/12/2014) 20 tablet 0  . oxyCODONE (OXY IR/ROXICODONE) 5 MG immediate release tablet Take 1-2 tablets (5-10 mg total) by mouth every 4 (four) hours as needed for moderate pain, severe pain or breakthrough pain. (Patient not taking: Reported on 11/06/2014) 20 tablet 0  . zolpidem (AMBIEN) 5 MG tablet Take 1 tablet (5 mg total) by mouth at bedtime as needed for sleep. (Patient not taking: Reported on 10/26/2014) 30 tablet 0   Current Facility-Administered  Medications on File Prior to Visit  Medication Dose Route Frequency Provider Last Rate Last Dose  . fluorouracil (ADRUCIL) 3,500 mg in sodium chloride 0.9 % 80 mL chemo infusion  2,400 mg/m2 (Treatment Plan Actual) Intravenous 1 day or 1 dose Patrici Ranks, MD      . fluorouracil (ADRUCIL) chemo injection 600 mg  400 mg/m2 (Treatment Plan Actual) Intravenous Once Patrici Ranks, MD      . sodium chloride 0.9 % injection 10 mL  10 mL Intracatheter PRN Patrici Ranks, MD   10 mL at 11/06/14 1039    Past Surgical History  Procedure Laterality Date  . Breast surgery    . Uterine fibroid surgery    . Colonoscopy w/ biopsies  06/29/2014    DR HUNG  . Flexible sigmoidoscopy N/A 06/30/2014    Procedure: FLEXIBLE SIGMOIDOSCOPY;  Surgeon: Carol Ada, MD;  Location: [facility] ENDOSCOPY;  Service: Endoscopy;  Laterality: N/A;  . Colonic stent placement N/A 06/30/2014    Procedure: COLONIC STENT PLACEMENT;  Surgeon: Carol Ada, MD;  Location: [facility] ENDOSCOPY;  Service: Endoscopy;  Laterality: N/A;  with fluro   . Portacath placement N/A 07/04/2014    Procedure: POWER  PORT PLACEMENT;  Surgeon: Alphonsa Overall, MD;  Location: [place];  Service: General;  Laterality: N/A;  . Laparotomy N/A 08/31/2014    Procedure: EXPLORATORY LAPAROTOMY;  Surgeon: Michael Boston, MD;  Location: [facility];  Service: General;  Laterality: N/A;  . Bowel resection N/A 08/31/2014    Procedure: SMALL BOWEL RESECTION;  Surgeon: Michael Boston, MD;  Location: [facility];  Service: General;  Laterality: N/A;  . Colostomy N/A 08/31/2014    Procedure: low anterior resection with COLOSTOMY;  Surgeon: Michael Boston, MD;  Location: [facility];  Service: General;  Laterality: N/A;  . Appendectomy  08/31/2014    Procedure: APPENDECTOMY;  Surgeon: Michael Boston, MD;  Location: [facility];  Service: General;;  . Supracervical abdominal hysterectomy  08/31/2014    Procedure: HYSTERECTOMY SUPRACERVICAL ABDOMINAL BILATERAL TUBES AND OVARIES;  Surgeon: Michael Boston, MD;   Location: [facility];  Service: General;;  . Transverse colon resection  08/31/2014    Procedure: TRANSVERSE COLON RESECTION;  Surgeon: Michael Boston, MD;  Location: [facility];  Service: General;;  . Debulking  08/31/2014    Procedure: DEBULKING OF PERITONEUM;  Surgeon: Michael Boston, MD;  Location: [facility];  Service: General;;    Denies any headaches, dizziness, double vision, fevers, chills, night sweats, nausea, vomiting, diarrhea, constipation, chest pain, heart palpitations, shortness of breath, blood in stool, black tarry stool, urinary pain, urinary burning, urinary frequency, hematuria. Positive for fever, cough, and abdominal pain.     Severe abdominal pain over her liver.   PHYSICAL EXAMINATION  ECOG PERFORMANCE STATUS: 2 - Symptomatic, <50% confined to bed  Filed Vitals:   11/06/14 0800  BP: 131/66  Pulse: 77  Temp: 98.4 F (36.9 C)  Resp: 16    GENERAL:alert, no distress, cachectic, cooperative, smiling and accompanied by her husband, Jenny Reichmann. SKIN: skin color, texture, turgor are normal HEAD: Normocephalic, No masses, lesions, tenderness or abnormalities EYES: normal, PERRLA, EOMI, Conjunctiva are pink and non-injected EARS: External ears normal OROPHARYNX:lips, buccal mucosa, and tongue normal and mucous membranes are moist  NECK: supple, trachea midline LYMPH:  not examined BREAST:not examined LUNGS: clear to auscultation and percussion HEART: regular rate & rhythm, no murmurs, no gallops, S1 normal and S2 normal ABDOMEN:abdomen soft, non-tender, normal bowel sounds and ostomy noted BACK: Back symmetric, no curvature. EXTREMITIES:less then 2 second capillary refill, no joint deformities, effusion, or inflammation, no skin discoloration, no cyanosis  NEURO: alert & oriented x 3 with fluent speech, no focal motor/sensory deficits   LABORATORY DATA: CBC    Component Value Date/Time   WBC 7.7 11/06/2014 0845   WBC 5.5 08/16/2014 1037   RBC 3.10* 11/06/2014 0845   RBC 2.71*  09/12/2014 0515   RBC 3.61* 08/16/2014 1037   HGB 8.7* 11/06/2014 0845   HGB 9.6* 08/16/2014 1037   HCT 27.6* 11/06/2014 0845   HCT 30.9* 08/16/2014 1037   PLT 239 11/06/2014 0845   PLT 167 08/16/2014 1037   MCV 89.0 11/06/2014 0845   MCV 85.6 08/16/2014 1037   MCH 28.1 11/06/2014 0845   MCH 26.6 08/16/2014 1037   MCHC 31.5 11/06/2014 0845   MCHC 31.1* 08/16/2014 1037   RDW 14.3 11/06/2014 0845   RDW 16.1* 08/16/2014 1037   LYMPHSABS 1.2 11/06/2014 0845   LYMPHSABS 1.2 08/16/2014 1037   MONOABS 1.0 11/06/2014 0845   MONOABS 0.9 08/16/2014 1037   EOSABS 0.2 11/06/2014 0845   EOSABS 0.2 08/16/2014 1037   BASOSABS 0.0 11/06/2014 0845   BASOSABS 0.0 08/16/2014 1037  Chemistry      Component Value Date/Time   NA 136 11/06/2014 0845   NA 140 08/16/2014 1037   K 3.4* 11/06/2014 0845   K 4.2 08/16/2014 1037   CL 99* 11/06/2014 0845   CO2 27 11/06/2014 0845   CO2 28 08/16/2014 1037   BUN 21* 11/06/2014 0845   BUN 23.1 08/16/2014 1037   CREATININE 0.51 11/06/2014 0845   CREATININE 0.7 08/16/2014 1037      Component Value Date/Time   CALCIUM 8.8* 11/06/2014 0845   CALCIUM 9.5 08/16/2014 1037   ALKPHOS 422* 11/06/2014 0845   ALKPHOS 143 08/16/2014 1037   AST 51* 11/06/2014 0845   AST 21 08/16/2014 1037   ALT 64* 11/06/2014 0845   ALT 15 08/16/2014 1037   BILITOT 0.2* 11/06/2014 0845   BILITOT 0.28 08/16/2014 1037     Lab Results  Component Value Date   CEA 54.7* 06/29/2014    PATHOLOGY:  Diagnosis 1. Soft tissue mass, biopsy, peritoneal mass dome of bladder - METASTATIC ADENOCARCINOMA. 2. Appendix, Incidental - METASTATIC ADENOCARCINOMA. 3. Small intestine, resection, ileum - METASTATIC ADENOCARCINOMA. - RESECTION MARGINS ARE NEGATIVE. 4. Colon, segmental resection, with omentum, transverse - METASTATIC ADENOCARCINOMA INVOLVING COLON AND OMENTUM. - RESECTION MARGINS ARE NEGATIVE. 5. Soft tissue, biopsy, right pelvic sidewall nodule - METASTATIC  ADENOCARCINOMA. 6. Soft tissue mass, biopsy, anterior peritoneum mass near dome - METASTATIC ADENOCARCINOMA. 7. Colon, segmental resection for tumor, rectum - INVASIVE ADENOCARCINOMA, MODERATELY DIFFERENTIATED - TUMOR INVADES THROUGH SEROSA, SEE COMMENT. - PERFORATION WITH VISIBLE STENT. - LYMPHOVASCULAR AND PERINEURAL INVASION IS PRESENT. - DISTAL AND PROXIMAL RESECTION MARGINS ARE NEGATIVE, SEE COMMENT FOR RADIAL MARGIN. - METASTATIC CARCINOMA IN FOUR OF FOURTEEN LYMPH NODES (4/14). - SEROSAL METASTATIC NODULES AND SATELLITE NODULES PRESENT. - SEE ONCOLOGY TABLE AND COMMENT.    ASSESSMENT AND PLAN:  Stage IV CRC Cachexia Pressure wound -- well healing Anemia  She will proceed with her next cycle of FOLFOX today. Avastin will be held until the pressure wound is completely healed. She is having fevers which seem to peak only at night, this occurred at diagnosis and she was advised that it is tumor fever. This is most likely the cause but I have advised her we need to be cautious. She is not willing to receive Neulasta because she has read about the side effects.  I have given her a prescription for Diflucan. I have advised her if her fevers persist or worsen over the next several days I do need her to call. She is very insistent that these are tumor related.  Her anemia is chronic we will continue to monitor this. I have encouraged ongoing improvement in her nutritional status.  She will return in 2 weeks with repeat labs, PE and her next cycle of chemotherapy.  All questions were answered. The patient knows to call the clinic with any problems, questions or concerns. We can certainly see the patient much sooner if necessary.  This document serves as a record of services personally performed by Ancil Linsey, MD. It was created on her behalf by Arlyce Harman, a trained medical scribe. The creation of this record is based on the scribe's personal observations and the provider's  statements to them. This document has been checked and approved by the attending provider.  I have reviewed the above documentation for accuracy and completeness, and I agree with the above.   [name], MD 11/06/2014 1:05 PM

## 2014-11-06 NOTE — Patient Instructions (Signed)
[place] at [facility] Discharge Instructions  RECOMMENDATIONS MADE BY THE CONSULTANT AND ANY TEST RESULTS WILL BE SENT TO YOUR REFERRING PHYSICIAN.  Exam completed by Dr [name] today Chemotherapy as scheduled today Return Wednesday to have pump removed  Return in 2 weeks to see the doctor and to have chemotherapy again.   Thank you for choosing [place] at [facility] to provide your oncology and hematology care.  To afford each patient quality time with our provider, please arrive at least 15 minutes before your scheduled appointment time.    You need to re-schedule your appointment should you arrive 10 or more minutes late.  We strive to give you quality time with our providers, and arriving late affects you and other patients whose appointments are after yours.  Also, if you no show three or more times for appointments you may be dismissed from the clinic at the providers discretion.     Again, thank you for choosing [facility].  Our hope is that these requests will decrease the amount of time that you wait before being seen by our physicians.       _____________________________________________________________  Should you have questions after your visit to [facility], please contact our office at [phone] between the hours of 8:30 a.m. and 4:30 p.m.  Voicemails left after 4:30 p.m. will not be returned until the following business day.  For prescription refill requests, have your pharmacy contact our office.

## 2014-11-06 NOTE — Patient Instructions (Signed)
[facility] Discharge Instructions for Patients Receiving Chemotherapy  Today you received the following chemotherapy agents oxaliplatin,leucovorin and fluorouracil.  To help prevent nausea and vomiting after your treatment, we encourage you to take your nausea medication as instructed. If you develop nausea and vomiting that is not controlled by your nausea medication, call the clinic. If it is after clinic hours your family physician or the after hours number for the clinic or go to the Emergency Department. BELOW ARE SYMPTOMS THAT SHOULD BE REPORTED IMMEDIATELY:  *FEVER GREATER THAN 101.0 F  *CHILLS WITH OR WITHOUT FEVER  NAUSEA AND VOMITING THAT IS NOT CONTROLLED WITH YOUR NAUSEA MEDICATION  *UNUSUAL SHORTNESS OF BREATH  *UNUSUAL BRUISING OR BLEEDING  TENDERNESS IN MOUTH AND THROAT WITH OR WITHOUT PRESENCE OF ULCERS  *URINARY PROBLEMS  *BOWEL PROBLEMS  UNUSUAL RASH Items with * indicate a potential emergency and should be followed up as soon as possible.  Return to clinic Wednesday as scheduled to have pump removed.  I have been informed and understand all the instructions given to me. I know to contact the clinic, my physician, or go to the Emergency Department if any problems should occur. I do not have any questions at this time, but understand that I may call the clinic during office hours or the Patient Navigator at [phone] should I have any questions or need assistance in obtaining follow up care.    __________________________________________  _____________  __________ Signature of Patient or Authorized Representative            Date                   Time    __________________________________________ Nurse's Signature

## 2014-11-06 NOTE — Progress Notes (Signed)
Tolerated chemo well. Ambulatory on discharge home with continuos infusion pump intact, husband at side.

## 2014-11-07 ENCOUNTER — Telehealth (HOSPITAL_COMMUNITY): Payer: Self-pay | Admitting: Emergency Medicine

## 2014-11-07 ENCOUNTER — Other Ambulatory Visit (HOSPITAL_COMMUNITY): Payer: Self-pay | Admitting: Emergency Medicine

## 2014-11-07 LAB — CEA: CEA: 26.3 ng/mL — ABNORMAL HIGH (ref 0.0–4.7)

## 2014-11-07 MED ORDER — POTASSIUM CHLORIDE CRYS ER 20 MEQ PO TBCR: 20.0000 meq | EXTENDED_RELEASE_TABLET | Freq: Every day | ORAL | Status: DC

## 2014-11-07 NOTE — Telephone Encounter (Signed)
Potassium 20 mEq ordered to [employer].  Pt verbalized understanding

## 2014-11-07 NOTE — Telephone Encounter (Signed)
-----   Message from Patrici Ranks, MD sent at 11/06/2014  7:56 PM EDT ----- effer K 20 meq daily by mouth. Dr.p

## 2014-11-08 ENCOUNTER — Encounter (HOSPITAL_BASED_OUTPATIENT_CLINIC_OR_DEPARTMENT_OTHER): Payer: Commercial Managed Care - HMO

## 2014-11-08 VITALS — BP 127/69 | HR 66 | Temp 98.6°F | Resp 16

## 2014-11-08 DIAGNOSIS — C786 Secondary malignant neoplasm of retroperitoneum and peritoneum: Secondary | ICD-10-CM | POA: Diagnosis not present

## 2014-11-08 DIAGNOSIS — C19 Malignant neoplasm of rectosigmoid junction: Secondary | ICD-10-CM

## 2014-11-08 DIAGNOSIS — Z452 Encounter for adjustment and management of vascular access device: Secondary | ICD-10-CM | POA: Diagnosis not present

## 2014-11-08 DIAGNOSIS — C189 Malignant neoplasm of colon, unspecified: Secondary | ICD-10-CM

## 2014-11-08 DIAGNOSIS — C787 Secondary malignant neoplasm of liver and intrahepatic bile duct: Secondary | ICD-10-CM

## 2014-11-08 MED ORDER — SODIUM CHLORIDE 0.9 % IJ SOLN
10.0000 mL | INTRAMUSCULAR | Status: DC | PRN
  Administered 2014-11-08: 10 mL
  Filled 2014-11-08: qty 10

## 2014-11-08 MED ORDER — HEPARIN SOD (PORK) LOCK FLUSH 100 UNIT/ML IV SOLN
INTRAVENOUS | Status: AC
  Filled 2014-11-08: qty 5

## 2014-11-08 MED ORDER — HEPARIN SOD (PORK) LOCK FLUSH 100 UNIT/ML IV SOLN
500.0000 [IU] | Freq: Once | INTRAVENOUS | Status: AC | PRN
  Administered 2014-11-08: 500 [IU]

## 2014-11-08 NOTE — Patient Instructions (Signed)
[place] at [facility] Discharge Instructions  RECOMMENDATIONS MADE BY THE CONSULTANT AND ANY TEST RESULTS WILL BE SENT TO YOUR REFERRING PHYSICIAN.  Infusion pump discontinued. Report any issues/concerns to clinic as needed. Return as scheduled.  Thank you for choosing [place] at [facility] to provide your oncology and hematology care.  To afford each patient quality time with our provider, please arrive at least 15 minutes before your scheduled appointment time.    You need to re-schedule your appointment should you arrive 10 or more minutes late.  We strive to give you quality time with our providers, and arriving late affects you and other patients whose appointments are after yours.  Also, if you no show three or more times for appointments you may be dismissed from the clinic at the providers discretion.     Again, thank you for choosing [facility].  Our hope is that these requests will decrease the amount of time that you wait before being seen by our physicians.       _____________________________________________________________  Should you have questions after your visit to [facility], please contact our office at [phone] between the hours of 8:30 a.m. and 4:30 p.m.  Voicemails left after 4:30 p.m. will not be returned until the following business day.  For prescription refill requests, have your pharmacy contact our office.

## 2014-11-08 NOTE — Progress Notes (Signed)
Patient to clinic to have continuous infusion pump discontinued. Patient complains of nausea since Monday. States she has not taken ativan at home because Dr.Sherill told her not to take xanax and ativan at same time. States she doesn't take the zofran because it makes her dry heave. Reports headache since last night but she didn't anything. Reports constipation and pain at ostomy site. Report small amount hard stool in colostomy bag. Reports she has stool softeners but hasn't been using them. Reports pain on right side, states it's the tumors in her liver hurting. Complains of sweating and jaw pain but states she knows it is from the oxaliplatin. Patient became tearful stating she gets very emotional every time she takes chemo because it makes her feel like "crap". Discussed issues with Dr.Penland. Instructions to patient per Dr.Penland, take the ativan that she has at home for nausea, if it doesn't work we can offer her phenergan or compazine p.o. (patient declined both). Asked patient why she has not taken the prescribed oxycodone for pain, states she was told by a nurse if she couldn't tolerate taking percocet to not even take it. Patient states that she will just take tylenol and deal with it. MD recommended miralax or lactulose for constipation, patient said she will just use the stool softeners she has or take citrate of magnesium that she has at home. Encouraged patient if she changes her mind and would like for MD to prescribe any of the medications we discussed to call clinic.  Discontinued infusion pump. Flushed port per protocol. De-accessed port needle.

## 2014-11-14 DIAGNOSIS — C189 Malignant neoplasm of colon, unspecified: Secondary | ICD-10-CM | POA: Diagnosis not present

## 2014-11-14 DIAGNOSIS — L89152 Pressure ulcer of sacral region, stage 2: Secondary | ICD-10-CM | POA: Diagnosis not present

## 2014-11-14 DIAGNOSIS — K5090 Crohn's disease, unspecified, without complications: Secondary | ICD-10-CM | POA: Diagnosis not present

## 2014-11-14 DIAGNOSIS — C787 Secondary malignant neoplasm of liver and intrahepatic bile duct: Secondary | ICD-10-CM | POA: Diagnosis not present

## 2014-11-20 ENCOUNTER — Encounter (HOSPITAL_COMMUNITY): Payer: Self-pay | Admitting: Hematology & Oncology

## 2014-11-20 ENCOUNTER — Encounter (HOSPITAL_BASED_OUTPATIENT_CLINIC_OR_DEPARTMENT_OTHER): Payer: Commercial Managed Care - HMO

## 2014-11-20 ENCOUNTER — Encounter (HOSPITAL_BASED_OUTPATIENT_CLINIC_OR_DEPARTMENT_OTHER): Payer: Commercial Managed Care - HMO | Admitting: Hematology & Oncology

## 2014-11-20 VITALS — BP 104/61 | HR 70 | Temp 98.0°F | Resp 16 | Wt 96.8 lb

## 2014-11-20 DIAGNOSIS — R110 Nausea: Secondary | ICD-10-CM | POA: Diagnosis not present

## 2014-11-20 DIAGNOSIS — C787 Secondary malignant neoplasm of liver and intrahepatic bile duct: Secondary | ICD-10-CM

## 2014-11-20 DIAGNOSIS — C19 Malignant neoplasm of rectosigmoid junction: Secondary | ICD-10-CM

## 2014-11-20 DIAGNOSIS — R64 Cachexia: Secondary | ICD-10-CM

## 2014-11-20 DIAGNOSIS — C189 Malignant neoplasm of colon, unspecified: Secondary | ICD-10-CM

## 2014-11-20 DIAGNOSIS — D638 Anemia in other chronic diseases classified elsewhere: Secondary | ICD-10-CM | POA: Diagnosis not present

## 2014-11-20 DIAGNOSIS — C786 Secondary malignant neoplasm of retroperitoneum and peritoneum: Secondary | ICD-10-CM

## 2014-11-20 DIAGNOSIS — Z5111 Encounter for antineoplastic chemotherapy: Secondary | ICD-10-CM

## 2014-11-20 DIAGNOSIS — R109 Unspecified abdominal pain: Secondary | ICD-10-CM

## 2014-11-20 DIAGNOSIS — E876 Hypokalemia: Secondary | ICD-10-CM

## 2014-11-20 DIAGNOSIS — L89329 Pressure ulcer of left buttock, unspecified stage: Secondary | ICD-10-CM

## 2014-11-20 LAB — COMPREHENSIVE METABOLIC PANEL
ALBUMIN: 3.3 g/dL — AB (ref 3.5–5.0)
ALK PHOS: 290 U/L — AB (ref 38–126)
ALT: 18 U/L (ref 14–54)
ANION GAP: 8 (ref 5–15)
AST: 24 U/L (ref 15–41)
BUN: 23 mg/dL — ABNORMAL HIGH (ref 6–20)
CO2: 28 mmol/L (ref 22–32)
CREATININE: 0.53 mg/dL (ref 0.44–1.00)
Calcium: 8.9 mg/dL (ref 8.9–10.3)
Chloride: 100 mmol/L — ABNORMAL LOW (ref 101–111)
GFR calc Af Amer: >60 mL/min (ref >60)
GFR calc non Af Amer: >60 mL/min (ref >60)
Glucose, Bld: 96 mg/dL (ref 65–99)
Potassium: 3.7 mmol/L (ref 3.5–5.1)
Sodium: 136 mmol/L (ref 135–145)
Total Bilirubin: 0.4 mg/dL (ref 0.3–1.2)
Total Protein: 7.4 g/dL (ref 6.5–8.1)

## 2014-11-20 LAB — CBC WITH DIFFERENTIAL/PLATELET
BASOS ABS: 0 10*3/uL (ref 0.0–0.1)
Basophils Relative: 0 % (ref 0–1)
Eosinophils Absolute: 0.2 10*3/uL (ref 0.0–0.7)
Eosinophils Relative: 2 % (ref 0–5)
HEMATOCRIT: 28.5 % — AB (ref 36.0–46.0)
HEMOGLOBIN: 9.1 g/dL — AB (ref 12.0–15.0)
Lymphocytes Relative: 18 % (ref 12–46)
Lymphs Abs: 1.4 10*3/uL (ref 0.7–4.0)
MCH: 28.3 pg (ref 26.0–34.0)
MCHC: 31.9 g/dL (ref 30.0–36.0)
MCV: 88.5 fL (ref 78.0–100.0)
Monocytes Absolute: 1.2 10*3/uL — ABNORMAL HIGH (ref 0.1–1.0)
Monocytes Relative: 15 % — ABNORMAL HIGH (ref 3–12)
NEUTROS ABS: 5.1 10*3/uL (ref 1.7–7.7)
NEUTROS PCT: 65 % (ref 43–77)
Platelets: 199 10*3/uL (ref 150–400)
RBC: 3.22 MIL/uL — ABNORMAL LOW (ref 3.87–5.11)
RDW: 14.9 % (ref 11.5–15.5)
WBC: 7.9 10*3/uL (ref 4.0–10.5)

## 2014-11-20 MED ORDER — SODIUM CHLORIDE 0.9 % IV SOLN
2400.0000 mg/m2 | INTRAVENOUS | Status: DC
  Administered 2014-11-20: 3500 mg via INTRAVENOUS
  Filled 2014-11-20: qty 70

## 2014-11-20 MED ORDER — PALONOSETRON HCL INJECTION 0.25 MG/5ML
0.2500 mg | Freq: Once | INTRAVENOUS | Status: AC
  Administered 2014-11-20: 0.25 mg via INTRAVENOUS
  Filled 2014-11-20: qty 5

## 2014-11-20 MED ORDER — SODIUM CHLORIDE 0.9 % IJ SOLN
10.0000 mL | INTRAMUSCULAR | Status: DC | PRN
  Administered 2014-11-20: 10 mL
  Filled 2014-11-20: qty 10

## 2014-11-20 MED ORDER — SODIUM CHLORIDE 0.9 % IV SOLN
Freq: Once | INTRAVENOUS | Status: AC
  Administered 2014-11-20: 10:00:00 via INTRAVENOUS
  Filled 2014-11-20: qty 5

## 2014-11-20 MED ORDER — DEXTROSE 5 % IV SOLN
Freq: Once | INTRAVENOUS | Status: AC
  Administered 2014-11-20: 10:00:00 via INTRAVENOUS

## 2014-11-20 MED ORDER — DEXTROSE 5 % IV SOLN
68.0000 mg/m2 | Freq: Once | INTRAVENOUS | Status: AC
  Administered 2014-11-20: 100 mg via INTRAVENOUS
  Filled 2014-11-20: qty 20

## 2014-11-20 MED ORDER — HEPARIN SOD (PORK) LOCK FLUSH 100 UNIT/ML IV SOLN: 500.0000 [IU] | Freq: Once | INTRAVENOUS | Status: DC | PRN

## 2014-11-20 MED ORDER — FLUOROURACIL CHEMO INJECTION 2.5 GM/50ML
400.0000 mg/m2 | Freq: Once | INTRAVENOUS | Status: AC
  Administered 2014-11-20: 600 mg via INTRAVENOUS
  Filled 2014-11-20: qty 12

## 2014-11-20 MED ORDER — LEUCOVORIN CALCIUM INJECTION 350 MG
400.0000 mg/m2 | Freq: Once | INTRAVENOUS | Status: AC
  Administered 2014-11-20: 584 mg via INTRAVENOUS
  Filled 2014-11-20: qty 29.2

## 2014-11-20 MED ORDER — POTASSIUM BICARB-CITRIC ACID 20 MEQ PO TBEF: 20.0000 meq | EFFERVESCENT_TABLET | Freq: Every day | ORAL | Status: DC

## 2014-11-20 NOTE — Patient Instructions (Signed)
..  [place] at [facility] Discharge Instructions  RECOMMENDATIONS MADE BY THE CONSULTANT AND ANY TEST RESULTS WILL BE SENT TO YOUR REFERRING PHYSICIAN.  Exam per Dr. [name]  Switch potassium to effer K  Return in 2 weeks  Thank you for choosing [place] at [facility] to provide your oncology and hematology care.  To afford each patient quality time with our provider, please arrive at least 15 minutes before your scheduled appointment time.    You need to re-schedule your appointment should you arrive 10 or more minutes late.  We strive to give you quality time with our providers, and arriving late affects you and other patients whose appointments are after yours.  Also, if you no show three or more times for appointments you may be dismissed from the clinic at the providers discretion.     Again, thank you for choosing [facility].  Our hope is that these requests will decrease the amount of time that you wait before being seen by our physicians.       _____________________________________________________________  Should you have questions after your visit to [facility], please contact our office at [phone] between the hours of 8:30 a.m. and 4:30 p.m.  Voicemails left after 4:30 p.m. will not be returned until the following business day.  For prescription refill requests, have your pharmacy contact our office.

## 2014-11-20 NOTE — Progress Notes (Signed)
Sydney Morrison Tolerated chemotherapy well today Discharged ambulatory

## 2014-11-20 NOTE — Progress Notes (Signed)
Sydney Cahill, MD  [address]  [place] [address]  Stage IV CRC  CURRENT THERAPY: FOLFOX  INTERVAL HISTORY: Sydney Morrison 59 y.o. 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 Michael Boston on 08/31/2014 when she was admitted with SBO.   The patient is here today with her husband. When asked about her bout of crying while receiving chemo, she reports that she cries even when she gets a cold. Her husband notes that the crying is a lot more than the chemo. Ms. Privott describes herself as a "crying person". She says the chemo makes her feel "absolutely horrible", like the flu with fatigue, and severe nausea. She has tried zofran which gave her dry heaves, reglan "made her sick", varubi did not help. She has ativan but does not use it because she just sleeps on it, even after taking only half a pill. She is currently taking 0.25 mg xanax, cutting them into quarters and take one quarter at night to relax her. If she is really stressed, she'll take half. Her appetite has increased. She feels nauseous but can still eat. Her abdominal pain has diminished quite a bit but is still slight. She reports that her bed sore is completely closed and slightly red from recent healing. Before her last chemo, her stool was brown. After the chemo, her stool was tan and is now green in color. She has added more fiber and higher potassium foods to her diet.  She inquired about changing from oxaliplatin to another drug.   Past Medical History  Diagnosis Date  . Crohn's disease   . Chronic headache   . Cancer   . Nausea and vomiting 08/25/2014  . 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 [id number]; Chronic blood loss anemia; Antineoplastic chemotherapy induced anemia;  Dehydration; Chemotherapy-induced neuropathy; Nausea and vomiting; SBO (small bowel obstruction); Atrial flutter; Arterial hypotension; Goals of care, counseling/discussion; Screen for STD (sexually transmitted disease); Pressure ulcer, stage 1; Postoperative fever; Leucocytosis; Protein-calorie malnutrition, severe; Tobacco abuse; Encounter for palliative care; Ileus, postoperative; Hyponatremia; Nausea & vomiting; and Intolerance, food on her problem list.     is allergic to avelox; codeine; dextromethorphan; erythromycin; penicillins; percocet; reglan; zofran; and suprep.  Current Outpatient Prescriptions on File Prior to Visit  Medication Sig Dispense Refill  . acetaminophen (TYLENOL) 325 MG tablet Take 325 mg by mouth every 6 (six) hours as needed for fever or headache (headahce).     . ALPRAZolam (XANAX) 0.5 MG tablet Take 0.5 mg by mouth at bedtime as needed.    . cromolyn (NASALCROM) 5.2 MG/ACT nasal spray Place 1 spray into both nostrils at bedtime.    . Cyanocobalamin (VITAMIN B-12 CR) 1500 MCG TBCR Take 1 tablet by mouth daily.    . Fluorouracil (ADRUCIL IV) Inject into the vein every 14 (fourteen) days.    Marland Kitchen leucovorin in dextrose 5 % 250 mL Inject into the vein every 14 (fourteen) days.    Marland Kitchen lidocaine-prilocaine (EMLA) cream Apply 1 application topically as needed. Apply to portacath site 1-2 hours prior to use 30 g 3  . Multiple Vitamins-Minerals (MULTIVITAMIN WITH MINERALS) tablet Take 1 tablet by mouth daily.    . nicotine (NICODERM CQ - DOSED IN MG/24 HOURS) 14 mg/24hr patch Place 14 mg onto  the skin daily. Uses 7 mg    . oxaliplatin in dextrose 5 % 500 mL Inject into the vein every 14 (fourteen) days.    . collagenase (SANTYL) ointment Apply topically daily. Apply to affected area (Patient not taking: Reported on 11/06/2014) 15 g 0  . dexamethasone (DECADRON) 4 MG tablet Take 1 tablet (4 mg total) by mouth 2 (two) times daily. For 2 days. Begin day of pump disconnect. (Patient not  taking: Reported on 11/20/2014) 8 tablet 1  . fluconazole (DIFLUCAN) 150 MG tablet Take one tablet by mouth and repeat in 3 days (Patient not taking: Reported on 11/20/2014) 2 tablet 1  . hydrOXYzine (ATARAX/VISTARIL) 25 MG tablet Take 1 tablet (25 mg total) by mouth every 6 (six) hours as needed for itching. (Patient not taking: Reported on 10/12/2014) 30 tablet 0  . LORazepam (ATIVAN) 0.5 MG tablet Take 1 tablet (0.5 mg total) by mouth every 6 (six) hours as needed for anxiety. (Patient not taking: Reported on 10/12/2014) 30 tablet 0  . omeprazole (PRILOSEC) 40 MG capsule Take 40 mg by mouth daily as needed (nausea and vomiting).    . ondansetron (ZOFRAN ODT) 4 MG disintegrating tablet 4mg  ODT q4 hours prn nausea/vomit (Patient not taking: Reported on 10/12/2014) 20 tablet 0  . oxyCODONE (OXY IR/ROXICODONE) 5 MG immediate release tablet Take 1-2 tablets (5-10 mg total) by mouth every 4 (four) hours as needed for moderate pain, severe pain or breakthrough pain. (Patient not taking: Reported on 11/06/2014) 20 tablet 0  . protein supplement (UNJURY VANILLA) POWD Take 7 g (2 oz total) by mouth 3 (three) times daily. (Patient not taking: Reported on 11/20/2014)    . rolapitant (VARUBI) 90 MG tablet Take 180 mg by mouth once.    . traMADol (ULTRAM) 50 MG tablet Take by mouth every 6 (six) hours as needed.    . zolpidem (AMBIEN) 5 MG tablet Take 1 tablet (5 mg total) by mouth at bedtime as needed for sleep. (Patient not taking: Reported on 10/26/2014) 30 tablet 0   No current facility-administered medications on file prior to visit.    Past Surgical History  Procedure Laterality Date  . Breast surgery    . Uterine fibroid surgery    . Colonoscopy w/ biopsies  06/29/2014    DR HUNG  . Flexible sigmoidoscopy N/A 06/30/2014    Procedure: FLEXIBLE SIGMOIDOSCOPY;  Surgeon: Carol Ada, MD;  Location: [facility] ENDOSCOPY;  Service: Endoscopy;  Laterality: N/A;  . Colonic stent placement N/A 06/30/2014    Procedure:  COLONIC STENT PLACEMENT;  Surgeon: Carol Ada, MD;  Location: [facility] ENDOSCOPY;  Service: Endoscopy;  Laterality: N/A;  with fluro   . Portacath placement N/A 07/04/2014    Procedure: POWER PORT PLACEMENT;  Surgeon: Alphonsa Overall, MD;  Location: [place];  Service: General;  Laterality: N/A;  . Laparotomy N/A 08/31/2014    Procedure: EXPLORATORY LAPAROTOMY;  Surgeon: Michael Boston, MD;  Location: [facility];  Service: General;  Laterality: N/A;  . Bowel resection N/A 08/31/2014    Procedure: SMALL BOWEL RESECTION;  Surgeon: Michael Boston, MD;  Location: [facility];  Service: General;  Laterality: N/A;  . Colostomy N/A 08/31/2014    Procedure: low anterior resection with COLOSTOMY;  Surgeon: Michael Boston, MD;  Location: [facility];  Service: General;  Laterality: N/A;  . Appendectomy  08/31/2014    Procedure: APPENDECTOMY;  Surgeon: Michael Boston, MD;  Location: [facility];  Service: General;;  . Supracervical abdominal hysterectomy  08/31/2014  Procedure: HYSTERECTOMY SUPRACERVICAL ABDOMINAL BILATERAL TUBES AND OVARIES;  Surgeon: Michael Boston, MD;  Location: [facility];  Service: General;;  . Transverse colon resection  08/31/2014    Procedure: TRANSVERSE COLON RESECTION;  Surgeon: Michael Boston, MD;  Location: [facility];  Service: General;;  . Debulking  08/31/2014    Procedure: DEBULKING OF PERITONEUM;  Surgeon: Michael Boston, MD;  Location: [facility];  Service: General;;    Denies any headaches, dizziness, double vision, fevers, chills, night sweats, nausea, vomiting, diarrhea, constipation, chest pain, heart palpitations, shortness of breath, blood in stool, black tarry stool, urinary pain, urinary burning, urinary frequency, hematuria. Positive for nausea, abdominal pain, and malaise/fatigue.    Severe nausea with chemotherapy. Abdominal pain has decreased since last visit.   PHYSICAL EXAMINATION ECOG PERFORMANCE STATUS: 2 - Symptomatic, <50% confined to bed  Filed Vitals:   11/20/14 0800  BP: 104/61  Pulse: 70  Temp: 98 F (36.7  C)  Resp: 16    GENERAL:alert, no distress, cachectic, cooperative, smiling and accompanied by her husband, John. SKIN: skin color, texture, turgor are normal HEAD: Normocephalic, No masses, lesions, tenderness or abnormalities EYES: normal, PERRLA, EOMI, Conjunctiva are pink and non-injected EARS: External ears normal OROPHARYNX:lips, buccal mucosa, and tongue normal and mucous membranes are moist  NECK: supple, trachea midline LYMPH:  not examined BREAST:not examined LUNGS: clear to auscultation and percussion HEART: regular rate & rhythm, no murmurs, no gallops, S1 normal and S2 normal ABDOMEN:abdomen soft, non-tender, normal bowel sounds and ostomy noted BACK: Back symmetric, no curvature. EXTREMITIES:less then 2 second capillary refill, no joint deformities, effusion, or inflammation, no skin discoloration, no cyanosis  NEURO: alert & oriented x 3 with fluent speech, no focal motor/sensory deficits   LABORATORY DATA: CBC    Component Value Date/Time   WBC 7.9 11/20/2014 0824   WBC 5.5 08/16/2014 1037   RBC 3.22* 11/20/2014 0824   RBC 2.71* 09/12/2014 0515   RBC 3.61* 08/16/2014 1037   HGB 9.1* 11/20/2014 0824   HGB 9.6* 08/16/2014 1037   HCT 28.5* 11/20/2014 0824   HCT 30.9* 08/16/2014 1037   PLT 199 11/20/2014 0824   PLT 167 08/16/2014 1037   MCV 88.5 11/20/2014 0824   MCV 85.6 08/16/2014 1037   MCH 28.3 11/20/2014 0824   MCH 26.6 08/16/2014 1037   MCHC 31.9 11/20/2014 0824   MCHC 31.1* 08/16/2014 1037   RDW 14.9 11/20/2014 0824   RDW 16.1* 08/16/2014 1037   LYMPHSABS 1.4 11/20/2014 0824   LYMPHSABS 1.2 08/16/2014 1037   MONOABS 1.2* 11/20/2014 0824   MONOABS 0.9 08/16/2014 1037   EOSABS 0.2 11/20/2014 0824   EOSABS 0.2 08/16/2014 1037   BASOSABS 0.0 11/20/2014 0824   BASOSABS 0.0 08/16/2014 1037      Chemistry      Component Value Date/Time   NA 136 11/20/2014 0824   NA 140 08/16/2014 1037   K 3.7 11/20/2014 0824   K 4.2 08/16/2014 1037   CL 100*  11/20/2014 0824   CO2 28 11/20/2014 0824   CO2 28 08/16/2014 1037   BUN 23* 11/20/2014 0824   BUN 23.1 08/16/2014 1037   CREATININE 0.53 11/20/2014 0824   CREATININE 0.7 08/16/2014 1037      Component Value Date/Time   CALCIUM 8.9 11/20/2014 0824   CALCIUM 9.5 08/16/2014 1037   ALKPHOS 290* 11/20/2014 0824   ALKPHOS 143 08/16/2014 1037   AST 24 11/20/2014 0824   AST 21 08/16/2014 1037   ALT 18 11/20/2014 0824  ALT 15 08/16/2014 1037   BILITOT 0.4 11/20/2014 0824   BILITOT 0.28 08/16/2014 1037     Lab Results  Component Value Date   CEA 26.3* 11/06/2014    PATHOLOGY:  Diagnosis 1. Soft tissue mass, biopsy, peritoneal mass dome of bladder - METASTATIC ADENOCARCINOMA. 2. Appendix, Incidental - METASTATIC ADENOCARCINOMA. 3. Small intestine, resection, ileum - METASTATIC ADENOCARCINOMA. - RESECTION MARGINS ARE NEGATIVE. 4. Colon, segmental resection, with omentum, transverse - METASTATIC ADENOCARCINOMA INVOLVING COLON AND OMENTUM. - RESECTION MARGINS ARE NEGATIVE. 5. Soft tissue, biopsy, right pelvic sidewall nodule - METASTATIC ADENOCARCINOMA. 6. Soft tissue mass, biopsy, anterior peritoneum mass near dome - METASTATIC ADENOCARCINOMA. 7. Colon, segmental resection for tumor, rectum - INVASIVE ADENOCARCINOMA, MODERATELY DIFFERENTIATED - TUMOR INVADES THROUGH SEROSA, SEE COMMENT. - PERFORATION WITH VISIBLE STENT. - LYMPHOVASCULAR AND PERINEURAL INVASION IS PRESENT. - DISTAL AND PROXIMAL RESECTION MARGINS ARE NEGATIVE, SEE COMMENT FOR RADIAL MARGIN. - METASTATIC CARCINOMA IN FOUR OF FOURTEEN LYMPH NODES (4/14). - SEROSAL METASTATIC NODULES AND SATELLITE NODULES PRESENT. - SEE ONCOLOGY TABLE AND COMMENT.    ASSESSMENT AND PLAN:  Stage IV CRC Cachexia Pressure wound -- well healing Anemia  She will proceed with her next cycle of FOLFOX today. Avastin will be held until the pressure wound is completely healed. She is not willing to receive Neulasta because she has  read about the side effects.  Her anemia is chronic we will continue to monitor this. I have encouraged ongoing improvement in her nutritional status.  She has significant problems with nausea after treatment. I have made some changes in her nausea medications including trying her on Varubi. She states that she cannot take she says Ativan makes her too sleepy so she will not take that. She is not willing to try Zyprexa. I am dose reducing her oxaliplatin by 20% to see if this helps with her ongoing nausea and inability to eat. I described to the patient and her husband in detail but I would rather try to work on her current regimen which is working for her, to make her side effects tolerable so that we can continue with our current drugs. I explained to the patient we do not have a lot of medications available for therapy in that utilizing each regimen to its fullest capacity is in her best interest.  She will return in 2 weeks with repeat labs, PE and her next cycle of chemotherapy.   All questions were answered. The patient knows to call the clinic with any problems, questions or concerns. We can certainly see the patient much sooner if necessary.  This document serves as a record of services personally performed by Ancil Linsey, MD. It was created on her behalf by Arlyce Harman, a trained medical scribe. The creation of this record is based on the scribe's personal observations and the provider's statements to them. This document has been checked and approved by the attending provider.  I have reviewed the above documentation for accuracy and completeness, and I agree with the above.  [name], MD  11/20/2014 4:26 PM

## 2014-11-20 NOTE — Patient Instructions (Signed)
[facility] Discharge Instructions for Patients Receiving Chemotherapy  Today you received the following chemotherapy agents folfox  Please pick up effer K Return as scheduled.   Return Wednesday to have your pump taken off. Please call the clinic if you have any questions or concerns  To help prevent nausea and vomiting after your treatment, we encourage you to take your nausea medication  If you develop nausea and vomiting, or diarrhea that is not controlled by your medication, call the clinic.  The clinic phone number is [phone]. Office hours are Monday-Friday 8:30am-5:00pm.  BELOW ARE SYMPTOMS THAT SHOULD BE REPORTED IMMEDIATELY:  *FEVER GREATER THAN 101.0 F  *CHILLS WITH OR WITHOUT FEVER  NAUSEA AND VOMITING THAT IS NOT CONTROLLED WITH YOUR NAUSEA MEDICATION  *UNUSUAL SHORTNESS OF BREATH  *UNUSUAL BRUISING OR BLEEDING  TENDERNESS IN MOUTH AND THROAT WITH OR WITHOUT PRESENCE OF ULCERS  *URINARY PROBLEMS  *BOWEL PROBLEMS  UNUSUAL RASH Items with * indicate a potential emergency and should be followed up as soon as possible. If you have an emergency after office hours please contact your primary care physician or go to the nearest emergency department.  Please call the clinic during office hours if you have any questions or concerns.   You may also contact the Patient Navigator at [phone] should you have any questions or need assistance in obtaining follow up care. _____________________________________________________________________ Have you asked about our STAR program?    STAR stands for Survivorship Training and Rehabilitation, and this is a nationally recognized cancer care program that focuses on survivorship and rehabilitation.  Cancer and cancer treatments may cause problems, such as, pain, making you feel tired and keeping you from doing the things that you need or want to do. Cancer rehabilitation can help. Our goal is to reduce these  troubling effects and help you have the best quality of life possible.  You may receive a survey from a nurse that asks questions about your current state of health.  Based on the survey results, all eligible patients will be referred to the [facility] program for an evaluation so we can better serve you! A frequently asked questions sheet is available upon request.

## 2014-11-22 ENCOUNTER — Encounter (HOSPITAL_BASED_OUTPATIENT_CLINIC_OR_DEPARTMENT_OTHER): Payer: Commercial Managed Care - HMO

## 2014-11-22 ENCOUNTER — Encounter (HOSPITAL_COMMUNITY): Payer: Self-pay

## 2014-11-22 VITALS — BP 120/62 | HR 67 | Temp 98.1°F | Resp 16

## 2014-11-22 DIAGNOSIS — C786 Secondary malignant neoplasm of retroperitoneum and peritoneum: Secondary | ICD-10-CM | POA: Diagnosis not present

## 2014-11-22 DIAGNOSIS — C19 Malignant neoplasm of rectosigmoid junction: Secondary | ICD-10-CM

## 2014-11-22 DIAGNOSIS — Z452 Encounter for adjustment and management of vascular access device: Secondary | ICD-10-CM | POA: Diagnosis not present

## 2014-11-22 DIAGNOSIS — C787 Secondary malignant neoplasm of liver and intrahepatic bile duct: Secondary | ICD-10-CM | POA: Diagnosis not present

## 2014-11-22 DIAGNOSIS — C189 Malignant neoplasm of colon, unspecified: Secondary | ICD-10-CM

## 2014-11-22 MED ORDER — SODIUM CHLORIDE 0.9 % IJ SOLN
10.0000 mL | INTRAMUSCULAR | Status: DC | PRN
  Administered 2014-11-22: 10 mL
  Filled 2014-11-22: qty 10

## 2014-11-22 MED ORDER — HEPARIN SOD (PORK) LOCK FLUSH 100 UNIT/ML IV SOLN
500.0000 [IU] | Freq: Once | INTRAVENOUS | Status: AC | PRN
  Administered 2014-11-22: 500 [IU]
  Filled 2014-11-22: qty 5

## 2014-11-22 MED ORDER — SODIUM CHLORIDE 0.9 % IV SOLN
10.0000 mg | Freq: Once | INTRAVENOUS | Status: DC
  Filled 2014-11-22: qty 1

## 2014-11-22 NOTE — Patient Instructions (Signed)
[place] at [facility] Discharge Instructions  RECOMMENDATIONS MADE BY THE CONSULTANT AND ANY TEST RESULTS WILL BE SENT TO YOUR REFERRING PHYSICIAN.  Pump removed and port flushed Return as scheduled Please call the clinic if you have any questions or concerns  Thank you for choosing [place] at [facility] to provide your oncology and hematology care.  To afford each patient quality time with our provider, please arrive at least 15 minutes before your scheduled appointment time.    You need to re-schedule your appointment should you arrive 10 or more minutes late.  We strive to give you quality time with our providers, and arriving late affects you and other patients whose appointments are after yours.  Also, if you no show three or more times for appointments you may be dismissed from the clinic at the providers discretion.     Again, thank you for choosing [facility].  Our hope is that these requests will decrease the amount of time that you wait before being seen by our physicians.       _____________________________________________________________  Should you have questions after your visit to [facility], please contact our office at [phone] between the hours of 8:30 a.m. and 4:30 p.m.  Voicemails left after 4:30 p.m. will not be returned until the following business day.  For prescription refill requests, have your pharmacy contact our office.

## 2014-11-22 NOTE — Progress Notes (Signed)
Sydney Morrison presented for Portacath de-access and flush.  Proper placement of portacath confirmed by CXR.  Portacath located left chest wall accessed with  H 20 needle.  Good blood return present. Portacath flushed with 83ml NS and 500U/7ml Heparin and needle removed intact.  Procedure tolerated well and without incident.  Pump removed  Pt refused dexamethasone.  OK to hold medication. Pt states that she has felt a lot better with this cycle.  Just a little nausea and constipation. I gave her the constipation sheet and explained it.  Verbalized understanding

## 2014-11-28 ENCOUNTER — Encounter (HOSPITAL_COMMUNITY): Payer: Self-pay | Admitting: Emergency Medicine

## 2014-11-28 ENCOUNTER — Inpatient Hospital Stay (HOSPITAL_COMMUNITY)
Admission: EM | Admit: 2014-11-28 | Discharge: 2014-11-30 | DRG: 391 | Disposition: A | Discharge status: Home / Self Care | Payer: Commercial Managed Care - HMO | Attending: Family Medicine | Admitting: Family Medicine

## 2014-11-28 ENCOUNTER — Emergency Department (HOSPITAL_COMMUNITY): Payer: Commercial Managed Care - HMO

## 2014-11-28 DIAGNOSIS — T451X5A Adverse effect of antineoplastic and immunosuppressive drugs, initial encounter: Secondary | ICD-10-CM | POA: Diagnosis not present

## 2014-11-28 DIAGNOSIS — C784 Secondary malignant neoplasm of small intestine: Secondary | ICD-10-CM | POA: Diagnosis present

## 2014-11-28 DIAGNOSIS — Z681 Body mass index (BMI) 19 or less, adult: Secondary | ICD-10-CM | POA: Diagnosis not present

## 2014-11-28 DIAGNOSIS — C785 Secondary malignant neoplasm of large intestine and rectum: Secondary | ICD-10-CM | POA: Diagnosis present

## 2014-11-28 DIAGNOSIS — Z9221 Personal history of antineoplastic chemotherapy: Secondary | ICD-10-CM | POA: Diagnosis not present

## 2014-11-28 DIAGNOSIS — C189 Malignant neoplasm of colon, unspecified: Secondary | ICD-10-CM | POA: Diagnosis not present

## 2014-11-28 DIAGNOSIS — R112 Nausea with vomiting, unspecified: Principal | ICD-10-CM | POA: Diagnosis present

## 2014-11-28 DIAGNOSIS — D696 Thrombocytopenia, unspecified: Secondary | ICD-10-CM | POA: Diagnosis not present

## 2014-11-28 DIAGNOSIS — Z933 Colostomy status: Secondary | ICD-10-CM

## 2014-11-28 DIAGNOSIS — Z809 Family history of malignant neoplasm, unspecified: Secondary | ICD-10-CM | POA: Diagnosis not present

## 2014-11-28 DIAGNOSIS — R1110 Vomiting, unspecified: Secondary | ICD-10-CM | POA: Diagnosis not present

## 2014-11-28 DIAGNOSIS — C786 Secondary malignant neoplasm of retroperitoneum and peritoneum: Secondary | ICD-10-CM | POA: Diagnosis present

## 2014-11-28 DIAGNOSIS — D6481 Anemia due to antineoplastic chemotherapy: Secondary | ICD-10-CM | POA: Diagnosis not present

## 2014-11-28 DIAGNOSIS — K5900 Constipation, unspecified: Secondary | ICD-10-CM | POA: Diagnosis present

## 2014-11-28 DIAGNOSIS — C787 Secondary malignant neoplasm of liver and intrahepatic bile duct: Secondary | ICD-10-CM | POA: Diagnosis present

## 2014-11-28 DIAGNOSIS — Z720 Tobacco use: Secondary | ICD-10-CM

## 2014-11-28 DIAGNOSIS — C7989 Secondary malignant neoplasm of other specified sites: Secondary | ICD-10-CM | POA: Diagnosis present

## 2014-11-28 DIAGNOSIS — C19 Malignant neoplasm of rectosigmoid junction: Secondary | ICD-10-CM | POA: Diagnosis present

## 2014-11-28 DIAGNOSIS — D6959 Other secondary thrombocytopenia: Secondary | ICD-10-CM | POA: Diagnosis present

## 2014-11-28 DIAGNOSIS — E43 Unspecified severe protein-calorie malnutrition: Secondary | ICD-10-CM | POA: Diagnosis present

## 2014-11-28 DIAGNOSIS — K5090 Crohn's disease, unspecified, without complications: Secondary | ICD-10-CM | POA: Diagnosis not present

## 2014-11-28 DIAGNOSIS — Z8249 Family history of ischemic heart disease and other diseases of the circulatory system: Secondary | ICD-10-CM | POA: Diagnosis not present

## 2014-11-28 DIAGNOSIS — Z87891 Personal history of nicotine dependence: Secondary | ICD-10-CM

## 2014-11-28 DIAGNOSIS — L89152 Pressure ulcer of sacral region, stage 2: Secondary | ICD-10-CM | POA: Diagnosis not present

## 2014-11-28 DIAGNOSIS — D630 Anemia in neoplastic disease: Secondary | ICD-10-CM | POA: Diagnosis present

## 2014-11-28 LAB — URINE MICROSCOPIC-ADD ON

## 2014-11-28 LAB — COMPREHENSIVE METABOLIC PANEL
ALT: 21 U/L (ref 14–54)
AST: 27 U/L (ref 15–41)
Albumin: 3.7 g/dL (ref 3.5–5.0)
Alkaline Phosphatase: 277 U/L — ABNORMAL HIGH (ref 38–126)
Anion gap: 12 (ref 5–15)
BILIRUBIN TOTAL: 0.3 mg/dL (ref 0.3–1.2)
BUN: 18 mg/dL (ref 6–20)
CHLORIDE: 98 mmol/L — AB (ref 101–111)
CO2: 26 mmol/L (ref 22–32)
CREATININE: 0.52 mg/dL (ref 0.44–1.00)
Calcium: 9.1 mg/dL (ref 8.9–10.3)
GFR calc Af Amer: >60 mL/min (ref >60)
Glucose, Bld: 128 mg/dL — ABNORMAL HIGH (ref 65–99)
Potassium: 3.3 mmol/L — ABNORMAL LOW (ref 3.5–5.1)
Sodium: 136 mmol/L (ref 135–145)
Total Protein: 7.9 g/dL (ref 6.5–8.1)

## 2014-11-28 LAB — URINALYSIS, ROUTINE W REFLEX MICROSCOPIC
Bilirubin Urine: NEGATIVE
GLUCOSE, UA: NEGATIVE mg/dL
Hgb urine dipstick: NEGATIVE
Ketones, ur: NEGATIVE mg/dL
LEUKOCYTES UA: NEGATIVE
Nitrite: NEGATIVE
PH: 5.5 (ref 5.0–8.0)
Urobilinogen, UA: 0.2 mg/dL (ref 0.0–1.0)

## 2014-11-28 LAB — LACTIC ACID, PLASMA
Lactic Acid, Venous: 1.7 mmol/L (ref 0.5–2.0)
Lactic Acid, Venous: 0.9 mmol/L (ref 0.5–2.0)

## 2014-11-28 LAB — CBC WITH DIFFERENTIAL/PLATELET
Basophils Absolute: 0 10*3/uL (ref 0.0–0.1)
Basophils Relative: 1 % (ref 0–1)
EOS ABS: 0.2 10*3/uL (ref 0.0–0.7)
Eosinophils Relative: 3 % (ref 0–5)
HEMATOCRIT: 30.6 % — AB (ref 36.0–46.0)
HEMOGLOBIN: 9.7 g/dL — AB (ref 12.0–15.0)
LYMPHS ABS: 1.7 10*3/uL (ref 0.7–4.0)
Lymphocytes Relative: 26 % (ref 12–46)
MCH: 27.1 pg (ref 26.0–34.0)
MCHC: 31.7 g/dL (ref 30.0–36.0)
MCV: 85.5 fL (ref 78.0–100.0)
MONOS PCT: 19 % — AB (ref 3–12)
Monocytes Absolute: 1.2 10*3/uL — ABNORMAL HIGH (ref 0.1–1.0)
NEUTROS PCT: 51 % (ref 43–77)
Neutro Abs: 3.3 10*3/uL (ref 1.7–7.7)
Platelets: 194 10*3/uL (ref 150–400)
RBC: 3.58 MIL/uL — ABNORMAL LOW (ref 3.87–5.11)
RDW: 14.7 % (ref 11.5–15.5)
WBC: 6.5 10*3/uL (ref 4.0–10.5)

## 2014-11-28 LAB — LIPASE, BLOOD: LIPASE: 30 U/L (ref 22–51)

## 2014-11-28 LAB — TROPONIN I

## 2014-11-28 MED ORDER — DICYCLOMINE HCL 10 MG/ML IM SOLN
20.0000 mg | Freq: Once | INTRAMUSCULAR | Status: AC
  Administered 2014-11-28: 20 mg via INTRAMUSCULAR
  Filled 2014-11-28: qty 2

## 2014-11-28 MED ORDER — PROMETHAZINE HCL 25 MG/ML IJ SOLN
12.5000 mg | Freq: Once | INTRAMUSCULAR | Status: AC
  Administered 2014-11-28: 12.5 mg via INTRAVENOUS
  Filled 2014-11-28: qty 1

## 2014-11-28 MED ORDER — LORAZEPAM 2 MG/ML IJ SOLN: 0.5000 mg | Freq: Once | INTRAMUSCULAR | Status: DC

## 2014-11-28 MED ORDER — POTASSIUM CHLORIDE 10 MEQ/100ML IV SOLN
10.0000 meq | Freq: Once | INTRAVENOUS | Status: AC
  Administered 2014-11-28: 10 meq via INTRAVENOUS
  Filled 2014-11-28: qty 100

## 2014-11-28 MED ORDER — POTASSIUM CHLORIDE CRYS ER 20 MEQ PO TBCR
40.0000 meq | EXTENDED_RELEASE_TABLET | Freq: Once | ORAL | Status: DC
  Filled 2014-11-28: qty 2

## 2014-11-28 MED ORDER — SODIUM CHLORIDE 0.9 % IV SOLN
INTRAVENOUS | Status: DC
  Administered 2014-11-28: 20:00:00 via INTRAVENOUS

## 2014-11-28 NOTE — ED Notes (Signed)
In room accessing port a  Cath and pt vomited large amount of thin brown emesis.  Approximately 500 cc.  Cleaned pt up and changed linen.

## 2014-11-28 NOTE — H&P (Signed)
PCP:   Delphina Cahill, MD   Chief Complaint:  Nausea and vomiting  HPI: 59 year old female   has a past medical history of Crohn's disease; Chronic headache; Cancer; Nausea and vomiting (08/25/2014); and Sinus infection. 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 Johney Maine, patient is currently receiving chemotherapy and her last chemotherapy was on 11/18/2014 when she received Folfox. Since then patient has been experiencing constipation and was trying MiraLAX. Patient says that MiraLAX was giving her cramping and today she developed nausea and vomiting. In the ED patient exit of the abdomen was done which was negative for obstruction She denies chest pain, no shortness of breath. No fever.  Allergies:   Allergies  Allergen Reactions  . Avelox [Moxifloxacin Hcl In Nacl] Hives  . Codeine Hives  . Dextromethorphan Hives  . Erythromycin Hives  . Penicillins Hives    No problem with Imipenem  . Percocet [Oxycodone-Acetaminophen] Nausea And Vomiting    Causes immediate vomiting  . Reglan [Metoclopramide]     Heart rate went up  . Zofran [Ondansetron Hcl]     Dry heaves  . Suprep [Na Sulfate-K Sulfate-Mg Sulf] Nausea And Vomiting      Past Medical History  Diagnosis Date  . Crohn's disease   . Chronic headache   . Cancer   . Nausea and vomiting 08/25/2014  . Sinus infection     Past Surgical History  Procedure Laterality Date  . Breast surgery    . Uterine fibroid surgery    . Colonoscopy w/ biopsies  06/29/2014    DR HUNG  . Flexible sigmoidoscopy N/A 06/30/2014    Procedure: FLEXIBLE SIGMOIDOSCOPY;  Surgeon: Carol Ada, MD;  Location: [facility] ENDOSCOPY;  Service: Endoscopy;  Laterality: N/A;  . Colonic stent placement N/A 06/30/2014    Procedure: COLONIC STENT PLACEMENT;  Surgeon: Carol Ada, MD;  Location: [facility] ENDOSCOPY;   Service: Endoscopy;  Laterality: N/A;  with fluro   . Portacath placement N/A 07/04/2014    Procedure: POWER PORT PLACEMENT;  Surgeon: Alphonsa Overall, MD;  Location: [place];  Service: General;  Laterality: N/A;  . Laparotomy N/A 08/31/2014    Procedure: EXPLORATORY LAPAROTOMY;  Surgeon: Michael Boston, MD;  Location: [facility];  Service: General;  Laterality: N/A;  . Bowel resection N/A 08/31/2014    Procedure: SMALL BOWEL RESECTION;  Surgeon: Michael Boston, MD;  Location: [facility];  Service: General;  Laterality: N/A;  . Colostomy N/A 08/31/2014    Procedure: low anterior resection with COLOSTOMY;  Surgeon: Michael Boston, MD;  Location: [facility];  Service: General;  Laterality: N/A;  . Appendectomy  08/31/2014    Procedure: APPENDECTOMY;  Surgeon: Michael Boston, MD;  Location: [facility];  Service: General;;  . Supracervical abdominal hysterectomy  08/31/2014    Procedure: HYSTERECTOMY SUPRACERVICAL ABDOMINAL BILATERAL TUBES AND OVARIES;  Surgeon: Michael Boston, MD;  Location: [facility];  Service: General;;  . Transverse colon resection  08/31/2014    Procedure: TRANSVERSE COLON RESECTION;  Surgeon: Michael Boston, MD;  Location: [facility];  Service: General;;  . Debulking  08/31/2014    Procedure: DEBULKING OF PERITONEUM;  Surgeon: Michael Boston, MD;  Location: [facility];  Service: General;;    Prior to Admission medications   Medication Sig Start Date End Date Taking? Authorizing Provider  acetaminophen (TYLENOL) 325 MG tablet Take 325 mg by mouth every 6 (six) hours  as needed for fever or headache (headahce).    Yes Historical Provider, MD  ALPRAZolam Duanne Moron) 0.5 MG tablet Take 0.5 mg by mouth at bedtime as needed for sleep.  09/17/14  Yes Historical Provider, MD  cromolyn (NASALCROM) 5.2 MG/ACT nasal spray Place 1 spray into both nostrils at bedtime.   Yes Historical Provider, MD  Cyanocobalamin (VITAMIN B-12 CR) 1500 MCG TBCR Take 1 tablet by mouth daily.   Yes Historical Provider, MD  dexamethasone (DECADRON) 4 MG tablet Take 1  tablet (4 mg total) by mouth 2 (two) times daily. For 2 days. Begin day of pump disconnect. 11/06/14  Yes Patrici Ranks, MD  ENSURE (ENSURE) Take 237 mLs by mouth 2 (two) times daily with a meal.   Yes Historical Provider, MD  Fluorouracil (ADRUCIL IV) Inject into the vein every 14 (fourteen) days.   Yes Historical Provider, MD  lidocaine-prilocaine (EMLA) cream Apply 1 application topically as needed. Apply to portacath site 1-2 hours prior to use 07/14/14  Yes Owens Shark, NP  Multiple Vitamins-Minerals (MULTIVITAMIN WITH MINERALS) tablet Take 1 tablet by mouth daily.   Yes Historical Provider, MD  nicotine (NICODERM CQ - DOSED IN MG/24 HR) 7 mg/24hr patch Place 7 mg onto the skin daily.   Yes Historical Provider, MD  oxaliplatin in dextrose 5 % 500 mL Inject into the vein every 14 (fourteen) days.   Yes Historical Provider, MD  Potassium Bicarb-Citric Acid 20 MEQ TBEF Take 1 tablet (20 mEq total) by mouth daily. Dissolve in ginger ale 11/20/14  Yes Patrici Ranks, MD  rolapitant (VARUBI) 90 MG tablet Take 180 mg by mouth once. 11/06/14  Yes Historical Provider, MD  traMADol (ULTRAM) 50 MG tablet Take 50 mg by mouth every 6 (six) hours as needed for moderate pain.    Yes Historical Provider, MD  collagenase (SANTYL) ointment Apply topically daily. Apply to affected area Patient not taking: Reported on 11/06/2014 09/21/14   Donne Hazel, MD  fluconazole (DIFLUCAN) 150 MG tablet Take one tablet by mouth and repeat in 3 days Patient not taking: Reported on 11/20/2014 11/06/14   Patrici Ranks, MD  hydrOXYzine (ATARAX/VISTARIL) 25 MG tablet Take 1 tablet (25 mg total) by mouth every 6 (six) hours as needed for itching. Patient not taking: Reported on 10/12/2014 09/21/14   Donne Hazel, MD  leucovorin in dextrose 5 % 250 mL Inject into the vein every 14 (fourteen) days.    Historical Provider, MD  LORazepam (ATIVAN) 0.5 MG tablet Take 1 tablet (0.5 mg total) by mouth every 6 (six) hours as needed for  anxiety. Patient not taking: Reported on 10/12/2014 08/21/14   Ladell Pier, MD  ondansetron (ZOFRAN ODT) 4 MG disintegrating tablet 4mg  ODT q4 hours prn nausea/vomit Patient not taking: Reported on 10/12/2014 08/24/14   Orpah Greek, MD  oxyCODONE (OXY IR/ROXICODONE) 5 MG immediate release tablet Take 1-2 tablets (5-10 mg total) by mouth every 4 (four) hours as needed for moderate pain, severe pain or breakthrough pain. Patient not taking: Reported on 11/06/2014 09/21/14   Donne Hazel, MD  protein supplement Renee Pain VANILLA) POWD Take 7 g (2 oz total) by mouth 3 (three) times daily. Patient not taking: Reported on 11/20/2014 09/21/14   Donne Hazel, MD  zolpidem (AMBIEN) 5 MG tablet Take 1 tablet (5 mg total) by mouth at bedtime as needed for sleep. Patient not taking: Reported on 10/26/2014 07/14/14   Owens Shark, NP  Social History:  reports that she quit smoking about 5 months ago. Her smoking use included Cigarettes. She has a 20 pack-year smoking history. She has never used smokeless tobacco. She reports that she does not drink alcohol or use illicit drugs.  Family History  Problem Relation Age of Onset  . Hypertension Mother   . Cancer Father     Danley Danker Weights   11/28/14 1753  Weight: 43.455 kg (95 lb 12.8 oz)    All the positives are listed in BOLD  Review of Systems:  HEENT: Headache, blurred vision, runny nose, sore throat Neck: Hypothyroidism, hyperthyroidism,,lymphadenopathy Chest : Shortness of breath, history of COPD, Asthma Heart : Chest pain, history of coronary arterey disease GI:  Nausea, vomiting, diarrhea, constipation, GERD GU: Dysuria, urgency, frequency of urination, hematuria Neuro: Stroke, seizures, syncope Psych: Depression, anxiety, hallucinations   Physical Exam: Blood pressure 123/70, pulse 55, temperature 97.9 F (36.6 C), temperature source Oral, resp. rate 20, height 5\' 7"  (1.702 m), weight 43.455 kg (95 lb 12.8 oz), SpO2 98  %. Constitutional:   Patient is a malnourished appearing female who is cooperative with exam. Head: Normocephalic and atraumatic Mouth: Mucus membranes moist Eyes: PERRL, EOMI, conjunctivae normal Neck: Supple, No Thyromegaly Cardiovascular: RRR, S1 normal, S2 normal Pulmonary/Chest: CTAB, no wheezes, rales, or rhonchi Abdominal: Soft. Non-tender, non-distended, colostomy bag in place, hard stool noted in the colostomy bag  Neurological: A&O x3, Strength is normal and symmetric bilaterally, cranial nerve II-XII are grossly intact, no focal motor deficit, sensory intact to light touch bilaterally.  Extremities : No Cyanosis, Clubbing or Edema  Labs on Admission:  Basic Metabolic Panel:  Recent Labs Lab 11/28/14 1839  NA 136  K 3.3*  CL 98*  CO2 26  GLUCOSE 128*  BUN 18  CREATININE 0.52  CALCIUM 9.1   Liver Function Tests:  Recent Labs Lab 11/28/14 1839  AST 27  ALT 21  ALKPHOS 277*  BILITOT 0.3  PROT 7.9  ALBUMIN 3.7    Recent Labs Lab 11/28/14 1839  LIPASE 30   No results for input(s): AMMONIA in the last 168 hours. CBC:  Recent Labs Lab 11/28/14 1839  WBC 6.5  NEUTROABS 3.3  HGB 9.7*  HCT 30.6*  MCV 85.5  PLT 194   Cardiac Enzymes:  Recent Labs Lab 11/28/14 1839  TROPONINI <0.03    Radiological Exams on Admission: Dg Abd Acute W/chest  11/28/2014   CLINICAL DATA:  Nausea vomiting today. Patient is currently on chemotherapy for colon cancer.  EXAM: DG ABDOMEN ACUTE W/ 1V CHEST  COMPARISON:  None.  FINDINGS: There is no evidence of dilated bowel loops or free intraperitoneal air. Left lower quadrant ostomy is identified. No radiopaque calculi or other significant radiographic abnormality is seen. Heart size and mediastinal contours are within normal limits. Left central venous line is identified. Both lungs are clear.  IMPRESSION: Negative abdominal radiographs.  No acute cardiopulmonary disease.   Electronically Signed   By: Abelardo Diesel M.D.   On:  11/28/2014 19:18    EKG: Independently reviewed. Normal sinus rhythm   Assessment/Plan Active Problems:   Rectosigmoid cancer metastasized to liver [id number]   Tobacco abuse   Nausea & vomiting   Intractable nausea and vomiting  Nausea and vomiting Will admit the patient, IV fluids, antiemetics Keep her nothing by mouth  Constipation Will start lactulose 15 mL by mouth 3 times a day  Adenocarcinoma of rectosigmoid Patient on chemotherapy Follow-up oncology as outpatient  Tobacco abuse Continue nicotine  patch   Code status: Full code  Family discussion: Admission, patients condition and plan of care including tests being ordered have been discussed with the patient and *her husband at bedside who indicate understanding and agree with the plan and Code Status.   Time Spent on Admission: 60 min  [facility] Pager: [phone] 11/28/2014, 11:00 PM  If 7PM-7AM, please contact night-coverage  [email or web address]  Password [email or web address]

## 2014-11-28 NOTE — ED Provider Notes (Signed)
CSN: [id number]     Arrival date & time 11/28/14  1737 History   First MD Initiated Contact with Patient 11/28/14 1827     Chief Complaint  Patient presents with  . Emesis  . Constipation  . Nausea     HPI Pt was seen at [place].  Per pt, c/o gradual onset and persistence of constant generalized abd "pain" since last week.  Pt states her LD chemo was last week and she often "gets constipated after my chemo." Pt states she was told to take miralax and stool softener. States the medications are causing abd "cramping."  States she developed N/V today and has been unable to tol PO. Pt states she has been passing gas and small hard stools through her colostomy bag today. Denies fevers, no back pain, no rash, no CP/SOB, no black or blood in stools or emesis.       Past Medical History  Diagnosis Date  . Crohn's disease   . Chronic headache   . Cancer   . Nausea and vomiting 08/25/2014  . Sinus infection    Past Surgical History  Procedure Laterality Date  . Breast surgery    . Uterine fibroid surgery    . Colonoscopy w/ biopsies  06/29/2014    DR HUNG  . Flexible sigmoidoscopy N/A 06/30/2014    Procedure: FLEXIBLE SIGMOIDOSCOPY;  Surgeon: Carol Ada, MD;  Location: [facility] ENDOSCOPY;  Service: Endoscopy;  Laterality: N/A;  . Colonic stent placement N/A 06/30/2014    Procedure: COLONIC STENT PLACEMENT;  Surgeon: Carol Ada, MD;  Location: [facility] ENDOSCOPY;  Service: Endoscopy;  Laterality: N/A;  with fluro   . Portacath placement N/A 07/04/2014    Procedure: POWER PORT PLACEMENT;  Surgeon: Alphonsa Overall, MD;  Location: [place];  Service: General;  Laterality: N/A;  . Laparotomy N/A 08/31/2014    Procedure: EXPLORATORY LAPAROTOMY;  Surgeon: Michael Boston, MD;  Location: [facility];  Service: General;  Laterality: N/A;  . Bowel resection N/A 08/31/2014    Procedure: SMALL BOWEL RESECTION;  Surgeon: Michael Boston, MD;  Location: [facility];  Service: General;  Laterality: N/A;  . Colostomy N/A 08/31/2014    Procedure:  low anterior resection with COLOSTOMY;  Surgeon: Michael Boston, MD;  Location: [facility];  Service: General;  Laterality: N/A;  . Appendectomy  08/31/2014    Procedure: APPENDECTOMY;  Surgeon: Michael Boston, MD;  Location: [facility];  Service: General;;  . Supracervical abdominal hysterectomy  08/31/2014    Procedure: HYSTERECTOMY SUPRACERVICAL ABDOMINAL BILATERAL TUBES AND OVARIES;  Surgeon: Michael Boston, MD;  Location[facility];  Service: General;;  . Transverse colon resection  08/31/2014    Procedure: TRANSVERSE COLON RESECTION;  Surgeon: Michael Boston, MD;  Location: [facility];  Service: General;;  . Debulking  08/31/2014    Procedure: DEBULKING OF PERITONEUM;  Surgeon: Michael Boston, MD;  Location: [facility];  Service: General;;   Family History  Problem Relation Age of Onset  . Hypertension Mother   . Cancer Father    Social History  Substance Use Topics  . Smoking status: Former Smoker -- 0.50 packs/day for 40 years    Types: Cigarettes    Quit date: 06/25/2014  . Smokeless tobacco: Never Used  . Alcohol Use: No   OB History    Gravida Para Term Preterm AB TAB SAB Ectopic Multiple Living   3 2 2  1  1         Review of Systems ROS: Statement: All systems negative except as  marked or noted in the HPI; Constitutional: Negative for fever and chills. ; ; Eyes: Negative for eye pain, redness and discharge. ; ; ENMT: Negative for ear pain, hoarseness, nasal congestion, sinus pressure and sore throat. ; ; Cardiovascular: Negative for chest pain, palpitations, diaphoresis, dyspnea and peripheral edema. ; ; Respiratory: Negative for cough, wheezing and stridor. ; ; Gastrointestinal: +N/V, abd cramping, constipation. Negative for diarrhea, blood in stool, hematemesis, jaundice and rectal bleeding. . ; ; Genitourinary: Negative for dysuria, flank pain and hematuria. ; ; Musculoskeletal: Negative for back pain and neck pain. Negative for swelling and trauma.; ; Skin: Negative for pruritus, rash, abrasions, blisters,  bruising and skin lesion.; ; Neuro: Negative for headache, lightheadedness and neck stiffness. Negative for weakness, altered level of consciousness , altered mental status, extremity weakness, paresthesias, involuntary movement, seizure and syncope.      Allergies  Avelox; Codeine; Dextromethorphan; Erythromycin; Penicillins; Percocet; Reglan; Zofran; and Suprep  Home Medications   Prior to Admission medications   Medication Sig Start Date End Date Taking? Authorizing Provider  acetaminophen (TYLENOL) 325 MG tablet Take 325 mg by mouth every 6 (six) hours as needed for fever or headache (headahce).     Historical Provider, MD  ALPRAZolam Duanne Moron) 0.5 MG tablet Take 0.5 mg by mouth at bedtime as needed. 09/17/14   Historical Provider, MD  collagenase (SANTYL) ointment Apply topically daily. Apply to affected area Patient not taking: Reported on 11/06/2014 09/21/14   Donne Hazel, MD  cromolyn (NASALCROM) 5.2 MG/ACT nasal spray Place 1 spray into both nostrils at bedtime.    Historical Provider, MD  Cyanocobalamin (VITAMIN B-12 CR) 1500 MCG TBCR Take 1 tablet by mouth daily.    Historical Provider, MD  dexamethasone (DECADRON) 4 MG tablet Take 1 tablet (4 mg total) by mouth 2 (two) times daily. For 2 days. Begin day of pump disconnect. Patient not taking: Reported on 11/20/2014 11/06/14   Patrici Ranks, MD  fluconazole (DIFLUCAN) 150 MG tablet Take one tablet by mouth and repeat in 3 days Patient not taking: Reported on 11/20/2014 11/06/14   Patrici Ranks, MD  Fluorouracil (ADRUCIL IV) Inject into the vein every 14 (fourteen) days.    Historical Provider, MD  hydrOXYzine (ATARAX/VISTARIL) 25 MG tablet Take 1 tablet (25 mg total) by mouth every 6 (six) hours as needed for itching. Patient not taking: Reported on 10/12/2014 09/21/14   Donne Hazel, MD  leucovorin in dextrose 5 % 250 mL Inject into the vein every 14 (fourteen) days.    Historical Provider, MD  lidocaine-prilocaine (EMLA) cream  Apply 1 application topically as needed. Apply to portacath site 1-2 hours prior to use 07/14/14   Owens Shark, NP  LORazepam (ATIVAN) 0.5 MG tablet Take 1 tablet (0.5 mg total) by mouth every 6 (six) hours as needed for anxiety. Patient not taking: Reported on 10/12/2014 08/21/14   Ladell Pier, MD  Multiple Vitamins-Minerals (MULTIVITAMIN WITH MINERALS) tablet Take 1 tablet by mouth daily.    Historical Provider, MD  nicotine (NICODERM CQ - DOSED IN MG/24 HOURS) 14 mg/24hr patch Place 14 mg onto the skin daily. Uses 7 mg    Historical Provider, MD  omeprazole (PRILOSEC) 40 MG capsule Take 40 mg by mouth daily as needed (nausea and vomiting).    Historical Provider, MD  ondansetron (ZOFRAN ODT) 4 MG disintegrating tablet 4mg  ODT q4 hours prn nausea/vomit Patient not taking: Reported on 10/12/2014 08/24/14   Orpah Greek, MD  oxaliplatin  in dextrose 5 % 500 mL Inject into the vein every 14 (fourteen) days.    Historical Provider, MD  oxyCODONE (OXY IR/ROXICODONE) 5 MG immediate release tablet Take 1-2 tablets (5-10 mg total) by mouth every 4 (four) hours as needed for moderate pain, severe pain or breakthrough pain. Patient not taking: Reported on 11/06/2014 09/21/14   Donne Hazel, MD  Potassium Bicarb-Citric Acid 20 MEQ TBEF Take 1 tablet (20 mEq total) by mouth daily. Dissolve in ginger ale 11/20/14   Patrici Ranks, MD  potassium chloride SA (K-DUR,KLOR-CON) 20 MEQ tablet Take 20 mEq by mouth daily. 11/07/14   Historical Provider, MD  protein supplement (UNJURY VANILLA) POWD Take 7 g (2 oz total) by mouth 3 (three) times daily. Patient not taking: Reported on 11/20/2014 09/21/14   Donne Hazel, MD  rolapitant (VARUBI) 90 MG tablet Take 180 mg by mouth once. 11/06/14   Historical Provider, MD  traMADol (ULTRAM) 50 MG tablet Take by mouth every 6 (six) hours as needed.    Historical Provider, MD  zolpidem (AMBIEN) 5 MG tablet Take 1 tablet (5 mg total) by mouth at bedtime as needed for  sleep. Patient not taking: Reported on 10/26/2014 07/14/14   Owens Shark, NP   BP 140/57 mmHg  Pulse 58  Temp(Src) 97.9 F (36.6 C) (Oral)  Resp 22  Ht 5\' 7"  (1.702 m)  Wt 95 lb 12.8 oz (43.455 kg)  BMI 15.00 kg/m2  SpO2 99% Physical Exam  1845: Physical examination:  Nursing notes reviewed; Vital signs and O2 SAT reviewed;  Constitutional: Thin, frail. In no acute distress; Head:  Normocephalic, atraumatic; Eyes: EOMI, PERRL, No scleral icterus; ENMT: Mouth and pharynx normal, Mucous membranes dry; Neck: Supple, Full range of motion, No lymphadenopathy; Cardiovascular: Regular rate and rhythm, No gallop; Respiratory: Breath sounds clear & equal bilaterally, No wheezes.  Speaking full sentences with ease, Normal respiratory effort/excursion; Chest: Nontender, Movement normal; Abdomen: Soft, Nontender, Nondistended, Normal bowel sounds. +gas and several small hard stool balls in colostomy bag. No obvious blood.; Genitourinary: No CVA tenderness; Extremities: Pulses normal, No tenderness, No edema, No calf edema or asymmetry.; Neuro: AA&Ox3, Major CN grossly intact.  Speech clear. No gross focal motor or sensory deficits in extremities.; Skin: Color normal, Warm, Dry.   ED Course  Procedures (including critical care time) Labs Review   Imaging Review  I have personally reviewed and evaluated these images and lab results as part of my medical decision-making.   EKG Interpretation   Date/Time:  Tuesday November 28 2014 18:43:11 EDT Ventricular Rate:  57 PR Interval:  141 QRS Duration: 87 QT Interval:  426 QTC Calculation: 415 R Axis:   78 Text Interpretation:  Sinus rhythm When compared with ECG of 08/29/2014 No  significant change was found Confirmed by [facility]  MD, Nunzio Cory [id number]) on  11/28/2014 7:04:32 PM      MDM  MDM Reviewed: previous chart, nursing note and vitals Reviewed previous: labs and ECG Interpretation: labs, x-ray and ECG      Results for orders placed or  performed during the hospital encounter of 11/28/14  Comprehensive metabolic panel  Result Value Ref Range   Sodium 136 135 - 145 mmol/L   Potassium 3.3 (L) 3.5 - 5.1 mmol/L   Chloride 98 (L) 101 - 111 mmol/L   CO2 26 22 - 32 mmol/L   Glucose, Bld 128 (H) 65 - 99 mg/dL   BUN 18 6 - 20 mg/dL   Creatinine, Ser  0.52 0.44 - 1.00 mg/dL   Calcium 9.1 8.9 - 10.3 mg/dL   Total Protein 7.9 6.5 - 8.1 g/dL   Albumin 3.7 3.5 - 5.0 g/dL   AST 27 15 - 41 U/L   ALT 21 14 - 54 U/L   Alkaline Phosphatase 277 (H) 38 - 126 U/L   Total Bilirubin 0.3 0.3 - 1.2 mg/dL   GFR calc non Af Amer >60 >60 mL/min   GFR calc Af Amer >60 >60 mL/min   Anion gap 12 5 - 15  Lipase, blood  Result Value Ref Range   Lipase 30 22 - 51 U/L  Lactic acid, plasma  Result Value Ref Range   Lactic Acid, Venous 1.7 0.5 - 2.0 mmol/L  Lactic acid, plasma  Result Value Ref Range   Lactic Acid, Venous 0.9 0.5 - 2.0 mmol/L  CBC with Differential  Result Value Ref Range   WBC 6.5 4.0 - 10.5 K/uL   RBC 3.58 (L) 3.87 - 5.11 MIL/uL   Hemoglobin 9.7 (L) 12.0 - 15.0 g/dL   HCT 30.6 (L) 36.0 - 46.0 %   MCV 85.5 78.0 - 100.0 fL   MCH 27.1 26.0 - 34.0 pg   MCHC 31.7 30.0 - 36.0 g/dL   RDW 14.7 11.5 - 15.5 %   Platelets 194 150 - 400 K/uL   Neutrophils Relative % 51 43 - 77 %   Neutro Abs 3.3 1.7 - 7.7 K/uL   Lymphocytes Relative 26 12 - 46 %   Lymphs Abs 1.7 0.7 - 4.0 K/uL   Monocytes Relative 19 (H) 3 - 12 %   Monocytes Absolute 1.2 (H) 0.1 - 1.0 K/uL   Eosinophils Relative 3 0 - 5 %   Eosinophils Absolute 0.2 0.0 - 0.7 K/uL   Basophils Relative 1 0 - 1 %   Basophils Absolute 0.0 0.0 - 0.1 K/uL  Troponin I  Result Value Ref Range   Troponin I <0.03 <0.031 ng/mL  Urinalysis, Routine w reflex microscopic (not at [facility])  Result Value Ref Range   Color, Urine YELLOW YELLOW   APPearance CLEAR CLEAR   Specific Gravity, Urine >1.030 (H) 1.005 - 1.030   pH 5.5 5.0 - 8.0   Glucose, UA NEGATIVE NEGATIVE mg/dL   Hgb urine  dipstick NEGATIVE NEGATIVE   Bilirubin Urine NEGATIVE NEGATIVE   Ketones, ur NEGATIVE NEGATIVE mg/dL   Protein, ur TRACE (A) NEGATIVE mg/dL   Urobilinogen, UA 0.2 0.0 - 1.0 mg/dL   Nitrite NEGATIVE NEGATIVE   Leukocytes, UA NEGATIVE NEGATIVE  Urine microscopic-add on  Result Value Ref Range   Squamous Epithelial / LPF MANY (A) RARE   WBC, UA 3-6 <3 WBC/hpf   RBC / HPF 0-2 <3 RBC/hpf   Bacteria, UA RARE RARE   Crystals CA OXALATE CRYSTALS (A) NEGATIVE   Urine-Other AMORPHOUS URATES/PHOSPHATES    Dg Abd Acute W/chest 11/28/2014   CLINICAL DATA:  Nausea vomiting today. Patient is currently on chemotherapy for colon cancer.  EXAM: DG ABDOMEN ACUTE W/ 1V CHEST  COMPARISON:  None.  FINDINGS: There is no evidence of dilated bowel loops or free intraperitoneal air. Left lower quadrant ostomy is identified. No radiopaque calculi or other significant radiographic abnormality is seen. Heart size and mediastinal contours are within normal limits. Left central venous line is identified. Both lungs are clear.  IMPRESSION: Negative abdominal radiographs.  No acute cardiopulmonary disease.   Electronically Signed   By: Abelardo Diesel M.D.   On: 11/28/2014 19:18  2125:  Udip appears contaminated. Potassium repleted IV. Pt continues to c/o nausea despite multiple doses of IV meds for same. Dx and testing d/w pt.  Questions answered.  Verb understanding, agreeable to admit.   T/C to [facility] Dr. Darrick Meigs, case discussed, including:  HPI, pertinent PM/SHx, VS/PE, dx testing, ED course and treatment:  Agreeable to admit, requests to write temporary orders, obtain medical bed to team APAdmits.   Francine Graven, DO 12/03/14 [phone]

## 2014-11-28 NOTE — ED Notes (Signed)
currently on chemotherapy for colon cancer.  Last treatment was Monday a week ago.  Notified oncologist and told to get miralax, stool softener, and fiber diet.  Having vomiting and nausea.

## 2014-11-29 DIAGNOSIS — D6481 Anemia due to antineoplastic chemotherapy: Secondary | ICD-10-CM

## 2014-11-29 DIAGNOSIS — R1110 Vomiting, unspecified: Secondary | ICD-10-CM

## 2014-11-29 DIAGNOSIS — T451X5A Adverse effect of antineoplastic and immunosuppressive drugs, initial encounter: Secondary | ICD-10-CM

## 2014-11-29 DIAGNOSIS — K5900 Constipation, unspecified: Secondary | ICD-10-CM

## 2014-11-29 DIAGNOSIS — D696 Thrombocytopenia, unspecified: Secondary | ICD-10-CM

## 2014-11-29 LAB — CBC
HCT: 25.1 % — ABNORMAL LOW (ref 36.0–46.0)
HEMOGLOBIN: 8.3 g/dL — AB (ref 12.0–15.0)
MCH: 28.4 pg (ref 26.0–34.0)
MCHC: 33.1 g/dL (ref 30.0–36.0)
MCV: 86 fL (ref 78.0–100.0)
Platelets: 149 10*3/uL — ABNORMAL LOW (ref 150–400)
RBC: 2.92 MIL/uL — AB (ref 3.87–5.11)
RDW: 14.8 % (ref 11.5–15.5)
WBC: 5.2 10*3/uL (ref 4.0–10.5)

## 2014-11-29 LAB — COMPREHENSIVE METABOLIC PANEL
ALBUMIN: 3 g/dL — AB (ref 3.5–5.0)
ALK PHOS: 222 U/L — AB (ref 38–126)
ALT: 18 U/L (ref 14–54)
AST: 23 U/L (ref 15–41)
Anion gap: 6 (ref 5–15)
BUN: 17 mg/dL (ref 6–20)
CALCIUM: 8.5 mg/dL — AB (ref 8.9–10.3)
CO2: 27 mmol/L (ref 22–32)
CREATININE: 0.56 mg/dL (ref 0.44–1.00)
Chloride: 105 mmol/L (ref 101–111)
GFR calc Af Amer: >60 mL/min (ref >60)
GFR calc non Af Amer: >60 mL/min (ref >60)
GLUCOSE: 88 mg/dL (ref 65–99)
Potassium: 3.8 mmol/L (ref 3.5–5.1)
SODIUM: 138 mmol/L (ref 135–145)
Total Bilirubin: 0.1 mg/dL — ABNORMAL LOW (ref 0.3–1.2)
Total Protein: 6.4 g/dL — ABNORMAL LOW (ref 6.5–8.1)

## 2014-11-29 LAB — GLUCOSE, CAPILLARY
GLUCOSE-CAPILLARY: 85 mg/dL (ref 65–99)
GLUCOSE-CAPILLARY: 123 mg/dL — AB (ref 65–99)
Glucose-Capillary: 88 mg/dL (ref 65–99)
Glucose-Capillary: 88 mg/dL (ref 65–99)
Glucose-Capillary: 87 mg/dL (ref 65–99)

## 2014-11-29 MED ORDER — ACETAMINOPHEN 650 MG RE SUPP: 650.0000 mg | Freq: Four times a day (QID) | RECTAL | Status: DC | PRN

## 2014-11-29 MED ORDER — SODIUM CHLORIDE 0.9 % IV SOLN: INTRAVENOUS | Status: DC

## 2014-11-29 MED ORDER — LORAZEPAM 0.5 MG PO TABS
0.5000 mg | ORAL_TABLET | Freq: Four times a day (QID) | ORAL | Status: DC | PRN
  Administered 2014-11-29 (×2): 0.5 mg via ORAL
  Filled 2014-11-29 (×2): qty 1

## 2014-11-29 MED ORDER — PRO-STAT SUGAR FREE PO LIQD
30.0000 mL | Freq: Two times a day (BID) | ORAL | Status: DC
  Administered 2014-11-29: 30 mL via ORAL
  Filled 2014-11-29 (×3): qty 30

## 2014-11-29 MED ORDER — LACTULOSE 10 GM/15ML PO SOLN
20.0000 g | Freq: Three times a day (TID) | ORAL | Status: DC
  Administered 2014-11-29 (×2): 20 g via ORAL
  Filled 2014-11-29 (×4): qty 30

## 2014-11-29 MED ORDER — LACTULOSE 10 GM/15ML PO SOLN
10.0000 g | Freq: Three times a day (TID) | ORAL | Status: DC
  Administered 2014-11-29 (×2): 10 g via ORAL
  Filled 2014-11-29 (×2): qty 30

## 2014-11-29 MED ORDER — NICOTINE 7 MG/24HR TD PT24
7.0000 mg | MEDICATED_PATCH | Freq: Every day | TRANSDERMAL | Status: DC
  Administered 2014-11-29 – 2014-11-30 (×2): 7 mg via TRANSDERMAL
  Filled 2014-11-29 (×3): qty 1

## 2014-11-29 MED ORDER — POTASSIUM CHLORIDE IN NACL 20-0.9 MEQ/L-% IV SOLN
INTRAVENOUS | Status: DC
Start: 2014-11-29 — End: 2014-11-29
  Administered 2014-11-29: 01:00:00 via INTRAVENOUS

## 2014-11-29 MED ORDER — ENOXAPARIN SODIUM 40 MG/0.4ML ~~LOC~~ SOLN: 40.0000 mg | SUBCUTANEOUS | Status: DC

## 2014-11-29 MED ORDER — ENOXAPARIN SODIUM 30 MG/0.3ML ~~LOC~~ SOLN
30.0000 mg | SUBCUTANEOUS | Status: DC
Start: 2014-11-30 — End: 2014-11-30
  Administered 2014-11-29: 30 mg via SUBCUTANEOUS
  Filled 2014-11-29: qty 0.3

## 2014-11-29 MED ORDER — ACETAMINOPHEN 325 MG PO TABS: 650.0000 mg | ORAL_TABLET | Freq: Four times a day (QID) | ORAL | Status: DC | PRN

## 2014-11-29 MED ORDER — ZOLPIDEM TARTRATE 5 MG PO TABS: 5.0000 mg | ORAL_TABLET | Freq: Every evening | ORAL | Status: DC | PRN

## 2014-11-29 MED ORDER — PROMETHAZINE HCL 25 MG/ML IJ SOLN: 12.5000 mg | Freq: Four times a day (QID) | INTRAMUSCULAR | Status: DC | PRN

## 2014-11-29 NOTE — Progress Notes (Addendum)
Initial Nutrition Assessment  DOCUMENTATION CODES:  Severe malnutrition in context of chronic illness, Underweight  INTERVENTION:  Prostat BID, (each supplement provides 100 kcal, 15 g Pro). Add to a 50% water, 50% ensure clear beverage  Gave education regarding dehydration and ORS. Left Handouts titled "Dehydration" and "Constipation"  NUTRITION DIAGNOSIS:  Increased nutrient needs related to cancer and cancer related treatments as evidenced by severe depletion of muscle mass, severe depletion of body fat and loss of >16% bw in the last 3 months  GOAL:  Patient will meet greater than or equal to 90% of their needs  MONITOR:  PO intake, Supplement acceptance, Diet advancement, Labs, Weight trends, constipation, diarrhea  REASON FOR ASSESSMENT:  Malnutrition Screening Tool    ASSESSMENT:  59 y.o. female with PMH crohns, SBO and rectal cancer with liver metastasis who is followed by RD at [facility], she is s/p rectal stent placement in 06/2014,  and chemotherapy prior to a surgery:08/31/14  w/ SBR, Colon resection, omentectomy, salpingo-oophorectomy, hysterectomy with colostomy. Pt restarted chemo post op. Last infusion on 8/20. Since that time she has had C/N/V.  Prior to the onset of her symptoms she was eating 5x a day: 2 supplements and 3 small meals. Her ostomy was functioning fine and she would empty it 1x a day.  Recently, Patient reports having both diarrhea and constipation intermittently. She has had a very high fiber intake. She reports eating a bowel of raisin bran and multiple pieces of fruit daily. She states that she has been trying to eat a bunch of fiber because she believed it was supposed to help with constipation. She would also drink 2 Ensure Clear supplements daily as she has poor tolerance to the regular ensure every time following a chemo session  Educated patient that a high fiber intake will only help with constipation if she is consuming an adequate amount of fluid.  If she is not, the excess fiber can actually cause constipation. The ensure clears she is consuming are higher in sugar. Educated patient that sugary beverages, like Ensure Clear or sports drinks, are not good for rehydration and can actually cause dehydration. Recommend making or purchasing an ORS to make sure she is hydrated. Dropped off recipe. Recommend decreasing her fiber intake to see if it helps resolve the constipation  At this time, due to her intolerance to regular Ensure and her possible dehydration will start Prostat BID in 50/50 Ensure Clear (she has brought with her) and Water. She still is having residual side effects from the oxaliplatin and can not have anything that is cold.   NFPE: Sever muscle/fat wasting  Diet Order:  Diet clear liquid Room service appropriate?: Yes; Fluid consistency:: Thin  Skin:  Reviewed, no issues  Last BM:  8/31- colostomy  Height:  Ht Readings from Last 1 Encounters:  11/28/14 5\' 7"  (1.702 m)   Weight:  Wt Readings from Last 1 Encounters:  11/29/14 92 lb 12.8 oz (42.094 kg)   Wt Readings from Last 10 Encounters:  11/29/14 92 lb 12.8 oz (42.094 kg)  11/20/14 96 lb 12.8 oz (43.908 kg)  11/06/14 99 lb 6.4 oz (45.088 kg)  10/26/14 99 lb 9.6 oz (45.178 kg)  10/12/14 100 lb 6.4 oz (45.541 kg)  09/21/14 103 lb 6.3 oz (46.9 kg)  08/25/14 110 lb (49.896 kg)  08/24/14 109 lb 12.8 oz (49.805 kg)  08/24/14 110 lb (49.896 kg)  08/16/14 110 lb 11.2 oz (50.213 kg)   Ideal Body Weight:  61.4 kg  BMI:  Body mass index is 14.53 kg/(m^2).  Estimated Nutritional Needs:  Kcal:  1450-1700 (35-40 kcal/kg) Protein:  63-84 g (1.5-2g/kg bw) Fluid:  1.5-1.7 liters  EDUCATION NEEDS:  Education needs addressed  Burtis Junes RD, LDN Nutrition Pager: [phone] 11/29/2014 2:12 PM

## 2014-11-29 NOTE — Progress Notes (Signed)
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 Abdominal XR done in the ED was negative for obstruction. Admitted for further evaluation and monitoring.   Assessment/Morrison: 1. Nausea and vomiting. Improving with IVF and antiemetics. AXR without evidence of obstruction or ileus. Nausea has been an issue secondary to chemotherapy with multiple treatmentes tried, did not tolerate Ativan. Oxaliplatin was decreased.   2. Constipation still unresolved. Increase Lactulose, PO TID (does not want to take Miralax) 3. 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 Johney Morrison, patient is currently receiving chemotherapy and her last chemotherapy was on 11/18/2014  4. Anemia of chronic disease/malignancy/chemotherapy induced. Hgb stable. No indication for transfusion. 5. Thrombocytopenia, secondary to chemotherapy. 6. Tobacco abuse. Continue nicotine patch.   Overall improved, some nausea, but no vomiting, wants to try liquids.  Increase Lactulose  Anticipate discharge home tomorrow if tolerating diet and bowels moving  Will let oncology know that patient is here  Code Status: Full DVT prophylaxis: Lovenox Family Communication: Husband bedside. Disposition Morrison: Anticipate discharge within 24 hours.   Sydney Hodgkins, MD  [facility]  Pager [phone] If 7PM-7AM, please contact night-coverage at [email or web address], password [facility] 11/29/2014, 7:32 AM  LOS: 1 day   Consultants:    Procedures:    Antibiotics:    HPI/Subjective: Feeling better with resolution of vomiting and pain. Nausea is mild and is still constipated.  Prior to chemotherapy she has not experienced constipation at this level. She has taken stool softners and Miralax with abdominal pain but no resolution of constipation.   Objective: Filed Vitals:   11/28/14 2230 11/28/14 2300 11/29/14 0008 11/29/14 0522  BP: 123/70 110/50 129/65 115/54  Pulse: 55 59 61 56  Temp:   98.5 F (36.9 C) 99 F (37.2 C)  TempSrc:   Oral Oral  Resp: 20 19 18 20   Height:      Weight:   42.094 kg (92 lb 12.8 oz)   SpO2: 98% 99% 100% 99%   No intake or output data in the 24 hours ending 11/29/14 0732   Filed Weights   11/28/14 1753 11/29/14 0008  Weight: 43.455 kg (95 lb 12.8 oz) 42.094 kg (92 lb 12.8 oz)    Exam:  Afebrile General:  Appears calm and comfortable Cardiovascular: RRR, no m/r/g. No LE edema. Respiratory: CTA bilaterally, no w/r/r. Normal respiratory effort. Abdomen: soft, colostomy LLQ Psychiatric: grossly normal mood and affect, speech fluent and appropriate  New data reviewed:  CBP unremarkable  CBS stable   Pertinent data since admission:  AXR IMPRESSION: Negative abdominal radiographs. No acute cardiopulmonary disease.  Pending data:  UC  Scheduled Meds: . sodium chloride   Intravenous STAT  . enoxaparin (LOVENOX) injection  40 mg Subcutaneous Q24H  . lactulose  10 g Oral TID  . nicotine  7 mg Transdermal Daily   Continuous Infusions: . 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 Sydney Morrison, M.D. on 11/29/2014 .  I have reviewed the above documentation for accuracy and completeness, and I agree with the above. Sydney Hodgkins, MD

## 2014-11-29 NOTE — Care Management Note (Signed)
Case Management Note  Patient Details  Name: Sydney Morrison MRN: [id number] Date of Birth: 04/12/1955  Subjective/Objective:                  Pt admitted from home with intractable nausea and vomiting. Pt is currently undergoing chemo for cancer. Pt lives with her husband and will return home at discharge. Pt is independent with ADl's.  Action/Plan: No Cm needs noted.  Expected Discharge Date:                  Expected Discharge Plan:  Home/Self Care  In-House Referral:  NA  Discharge planning Services  CM Consult  Post Acute Care Choice:  NA Choice offered to:  NA  DME Arranged:    DME Agency:     HH Arranged:    HH Agency:     Status of Service:  Completed, signed off  Medicare Important Message Given:    Date Medicare IM Given:    Medicare IM give by:    Date Additional Medicare IM Given:    Additional Medicare Important Message give by:     If discussed at [place] of Stay Meetings, dates discussed:    Additional Comments:  Joylene Draft, RN 11/29/2014, 11:55 AM

## 2014-11-30 DIAGNOSIS — C787 Secondary malignant neoplasm of liver and intrahepatic bile duct: Secondary | ICD-10-CM

## 2014-11-30 DIAGNOSIS — C189 Malignant neoplasm of colon, unspecified: Secondary | ICD-10-CM

## 2014-11-30 LAB — URINE CULTURE

## 2014-11-30 LAB — GLUCOSE, CAPILLARY: GLUCOSE-CAPILLARY: 89 mg/dL (ref 65–99)

## 2014-11-30 MED ORDER — HEPARIN SOD (PORK) LOCK FLUSH 100 UNIT/ML IV SOLN
500.0000 [IU] | INTRAVENOUS | Status: AC | PRN
Start: 2014-11-30 — End: 2014-11-30
  Administered 2014-11-30: 500 [IU]
  Filled 2014-11-30: qty 5

## 2014-11-30 MED ORDER — LACTULOSE 10 GM/15ML PO SOLN: 20.0000 g | Freq: Every day | ORAL | Status: DC | PRN

## 2014-11-30 NOTE — Care Management Note (Signed)
Case Management Note  Patient Details  Name: KAISLYN GULAS MRN: [id number] Date of Birth: December 14, 1955  Expected Discharge Date:                  Expected Discharge Plan:  Home/Self Care  In-House Referral:  NA  Discharge planning Services  CM Consult  Post Acute Care Choice:  NA Choice offered to:  NA  DME Arranged:    DME Agency:     HH Arranged:    [place]:     Status of Service:  Completed, signed off  Medicare Important Message Given:    Date Medicare IM Given:    Medicare IM give by:    Date Additional Medicare IM Given:    Additional Medicare Important Message give by:     If discussed at [place] of Stay Meetings, dates discussed:    Additional Comments: Pt [place] home with self care today, no CM needs.  Sherald Barge, RN 11/30/2014, 2:15 PM

## 2014-11-30 NOTE — Progress Notes (Signed)
Port deaccessed as per protocol. Discharge instructions reviewed with patient and husband. Understanding verbalized.

## 2014-11-30 NOTE — Progress Notes (Addendum)
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 Abdominal XR done in the ED was negative for obstruction. Admitted for further evaluation and monitoring.   Assessment/Plan: 1. Nausea and vomiting. Resolved with IVF and antiemetics. AXR without evidence of obstruction or ileus. Nausea has been an issue secondary to chemotherapy with multiple treatmentes tried, did not tolerate Ativan well. Oxaliplatin was decreased.   2. Constipation resolved due to increased Lactulose  3. 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 Johney Maine, patient is currently receiving chemotherapy and her last chemotherapy was on 11/18/2014  4. Anemia of chronic disease/malignancy/chemotherapy induced. Hgb stable. No indication for transfusion. 5. Thrombocytopenia, secondary to chemotherapy. 6. Tobacco abuse. Continue nicotine patch. 7. Severe malnutrition in context of chronic illness, Underweight   Overall improved.  Continue Lactulose daily as needed.   Follow up with oncologist as planned.   Home today.    Code Status: Full DVT prophylaxis: Lovenox Family Communication: Husband bedside. Discussed with patient who understands and has no concerns at this time. Disposition Plan: Anticipate discharge home today.  [name], MD  [facility]  Pager [phone] If 7PM-7AM, please contact night-coverage at [email or web address], password [facility] 11/30/2014, 7:54 AM  LOS: 2 days   Consultants:    Procedures:    Antibiotics:    HPI/Subjective: Feels great, bowls have moved and have been soft with no difficulty. No nausea, vomiting,  pain, dysuria, or discomfort.   Objective: Filed Vitals:   11/29/14 0843 11/29/14 1519 11/29/14 2117 11/30/14 0636  BP: 125/61 122/69 125/53 114/54  Pulse: 60 60 62 53  Temp: 98.6 F (37 C) 98.3 F (36.8 C) 99.1 F (37.3 C) 98.8 F (37.1 C)  TempSrc: Oral  Oral Oral  Resp: 18 18 16 16   Height:      Weight:      SpO2: 98% 100% 100% 100%    Intake/Output Summary (Last 24 hours) at 11/30/14 0754 Last data filed at 11/29/14 1700  Gross per 24 hour  Intake    480 ml  Output      0 ml  Net    480 ml     Filed Weights   11/28/14 1753 11/29/14 0008  Weight: 43.455 kg (95 lb 12.8 oz) 42.094 kg (92 lb 12.8 oz)    Exam:  Afebrile General:  Appears calm and comfortable Cardiovascular: RRR, no m/r/g. No LE edema. Respiratory: CTA bilaterally, no w/r/r. Normal respiratory effort. Abdomen: soft, colostomy LLQ noted Psychiatric: grossly normal mood and affect, speech fluent and appropriate  New data reviewed:  No new data  Pertinent data since admission:  AXR IMPRESSION: Negative abdominal radiographs. No acute cardiopulmonary disease.  Pending data:  UC  Scheduled Meds: . enoxaparin (LOVENOX) injection  30 mg Subcutaneous Q24H  . feeding supplement (PRO-STAT SUGAR FREE 64)  30 mL Oral BID  . lactulose  20 g Oral TID  . 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 [name], acting as scribe, recorded this note contemporaneously in the presence of Dr. Melene Plan. [name], M.D. on 11/30/2014 .  I have reviewed the above documentation for accuracy and completeness, and I agree with the above. [name], MD

## 2014-11-30 NOTE — Discharge Summary (Addendum)
Physician Discharge Summary  Sydney Morrison YHC:[id number] DOB: 18-Sep-1955 DOA: 11/28/2014  PCP: Delphina Cahill, MD  Admit date: 11/28/2014 Discharge date: 11/30/2014  Recommendations for Outpatient Follow-up:   Follow up with oncology as planned.    Continue Lactulose daily as needed for constipation  Follow-up Information    Follow up with Delphina Cahill, MD.   Specialty:  Internal Medicine   Why:  As needed   Contact information:    [place] [address] [phone]       Follow up with [name], MD.   Specialties:  Hematology and Oncology, Oncology   Why:  office will contact you for follow-up appointment   Contact information:   [address] [place] [address] [phone]        Discharge Diagnoses:  1. Nausea and vomiting 2. Constipation  3. Adenocarcinoma of the rectosigmoid colon with metastasis to liver, appendix, ileum, omentum, right side pelvic wall soft tissue and pericranium.  4. Anemia of chronic disease/malignancy/chemotherapy induced.  5. Thrombocytopenia, secondary to chemotherapy. 6. Tobacco abuse.  7. 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 Abdominal XR done in the ED was negative for obstruction. Admitted for further evaluation and monitoring.   Hospital Course:  Nausea and vomiting, which seemed to be secondary to chemotherapy were resolved through IVF and antiemetics. Constipation was relieved via increased PO Lactulose. Oncology was made aware of admission and will arrange  follow up care. Anemia and all other issues reminded stable during hospitalization.   Individual issues as below:   1. Nausea and vomiting. Resolved with IVF and antiemetics. AXR without evidence of obstruction or ileus. Nausea has been an issue secondary to chemotherapy with multiple treatmentes tried, did not tolerate Ativan. Oxaliplatin was decreased.  2. Constipation resolved due to increased Lactulose, PO. 3. 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 Johney Maine, patient is currently receiving chemotherapy and her last chemotherapy was on 11/18/2014  4. Anemia of chronic disease/malignancy/chemotherapy induced. Hgb stable. No indication for transfusion. 5. Thrombocytopenia, secondary to chemotherapy. 6. Tobacco abuse. Continue nicotine patch. 7. Severe malnutrition in context of chronic illness, Underweight  Consultants:  None   Procedures:  None  Antibiotics:  None  Discharge Instructions Discharge Instructions    Diet general    Complete by:  As directed      Discharge instructions    Complete by:  As directed   Call your physician or seek immediate medical attention for pain, vomiting, fever, constipation or worsening of condition.     Increase activity slowly    Complete by:  As directed             Current Discharge Medication List    START taking these medications   Details  lactulose (CHRONULAC) 10 GM/15ML solution Take 30 mLs (20 g total) by mouth daily as needed for mild constipation. Qty: 946 mL, Refills: 0      CONTINUE these medications which have NOT  CHANGED   Details  acetaminophen (TYLENOL) 325 MG tablet Take 325 mg by mouth every 6 (six) hours as needed for fever or headache (headahce).     ALPRAZolam (XANAX) 0.5 MG tablet Take 0.5 mg by mouth at bedtime as needed for sleep.    Associated Diagnoses: Protein-calorie  malnutrition, severe    cromolyn (NASALCROM) 5.2 MG/ACT nasal spray Place 1 spray into both nostrils at bedtime.    Cyanocobalamin (VITAMIN B-12 CR) 1500 MCG TBCR Take 1 tablet by mouth daily.    dexamethasone (DECADRON) 4 MG tablet Take 1 tablet (4 mg total) by mouth 2 (two) times daily. For 2 days. Begin day of pump disconnect. Qty: 8 tablet, Refills: 1   Associated Diagnoses: Nausea and vomiting, vomiting of unspecified type    ENSURE (ENSURE) Take 237 mLs by mouth 2 (two) times daily with a meal.    Fluorouracil (ADRUCIL IV) Inject into the vein every 14 (fourteen) days.    lidocaine-prilocaine (EMLA) cream Apply 1 application topically as needed. Apply to portacath site 1-2 hours prior to use Qty: 30 g, Refills: 3   Associated Diagnoses: Colon cancer    Multiple Vitamins-Minerals (MULTIVITAMIN WITH MINERALS) tablet Take 1 tablet by mouth daily.    nicotine (NICODERM CQ - DOSED IN MG/24 HR) 7 mg/24hr patch Place 7 mg onto the skin daily.    oxaliplatin in dextrose 5 % 500 mL Inject into the vein every 14 (fourteen) days.    Potassium Bicarb-Citric Acid 20 MEQ TBEF Take 1 tablet (20 mEq total) by mouth daily. Dissolve in ginger ale Qty: 30 each, Refills: 1   Associated Diagnoses: Hypokalemia    rolapitant (VARUBI) 90 MG tablet Take 180 mg by mouth once.    traMADol (ULTRAM) 50 MG tablet Take 50 mg by mouth every 6 (six) hours as needed for moderate pain.     leucovorin in dextrose 5 % 250 mL Inject into the vein every 14 (fourteen) days.    LORazepam (ATIVAN) 0.5 MG tablet Take 1 tablet (0.5 mg total) by mouth every 6 (six) hours as needed for anxiety. Qty: 30 tablet, Refills: 0      STOP taking these medications     collagenase (SANTYL) ointment      fluconazole (DIFLUCAN) 150 MG tablet      hydrOXYzine (ATARAX/VISTARIL) 25 MG tablet      ondansetron (ZOFRAN ODT) 4 MG disintegrating tablet      oxyCODONE (OXY IR/ROXICODONE) 5 MG immediate release tablet       protein supplement (UNJURY VANILLA) POWD      zolpidem (AMBIEN) 5 MG tablet        Allergies  Allergen Reactions  . Avelox [Moxifloxacin Hcl In Nacl] Hives  . Codeine Hives  . Dextromethorphan Hives  . Erythromycin Hives  . Penicillins Hives    No problem with Imipenem  . Percocet [Oxycodone-Acetaminophen] Nausea And Vomiting    Causes immediate vomiting  . Reglan [Metoclopramide]     Heart rate went up  . Zofran [Ondansetron Hcl]     Dry heaves  . Suprep [Na Sulfate-K Sulfate-Mg Sulf] Nausea And Vomiting    The results of significant diagnostics from this hospitalization (including imaging, microbiology, ancillary and laboratory) are listed below for reference.    Significant Diagnostic Studies: Dg Abd Acute W/chest  11/28/2014   CLINICAL DATA:  Nausea vomiting today. Patient is currently on chemotherapy for colon cancer.  EXAM: DG ABDOMEN ACUTE W/ 1V CHEST  COMPARISON:  None.  FINDINGS: There  is no evidence of dilated bowel loops or free intraperitoneal air. Left lower quadrant ostomy is identified. No radiopaque calculi or other significant radiographic abnormality is seen. Heart size and mediastinal contours are within normal limits. Left central venous line is identified. Both lungs are clear.  IMPRESSION: Negative abdominal radiographs.  No acute cardiopulmonary disease.   Electronically Signed   By: Abelardo Diesel M.D.   On: 11/28/2014 19:18    Labs: Basic Metabolic Panel:  Recent Labs Lab 11/28/14 1839 11/29/14 0519  NA 136 138  K 3.3* 3.8  CL 98* 105  CO2 26 27  GLUCOSE 128* 88  BUN 18 17  CREATININE 0.52 0.56  CALCIUM 9.1 8.5*   Liver Function Tests:  Recent Labs Lab 11/28/14 1839 11/29/14 0519  AST 27 23  ALT 21 18  ALKPHOS 277* 222*  BILITOT 0.3 0.1*  PROT 7.9 6.4*  ALBUMIN 3.7 3.0*    Recent Labs Lab 11/28/14 1839  LIPASE 30  CBC:  Recent Labs Lab 11/28/14 1839 11/29/14 0519  WBC 6.5 5.2  NEUTROABS 3.3  --   HGB 9.7* 8.3*  HCT 30.6*  25.1*  MCV 85.5 86.0  PLT 194 149*   Cardiac Enzymes:  Recent Labs Lab 11/28/14 1839  TROPONINI <0.03    CBG:  Recent Labs Lab 11/29/14 1200 11/29/14 1632 11/29/14 2119 11/29/14 2216 11/30/14 0728  GLUCAP 88 85 123* 87 89    Principal Problem:   Intractable nausea and vomiting Active Problems:   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 [name], acting as scribe, recorded this note contemporaneously in the presence of Dr. Melene Plan. [name], M.D. on 11/30/2014 .  I have reviewed the above documentation for accuracy and completeness, and I agree with the above. [name], MD

## 2014-12-06 ENCOUNTER — Other Ambulatory Visit: Payer: Self-pay | Admitting: Oncology

## 2014-12-06 ENCOUNTER — Encounter (HOSPITAL_BASED_OUTPATIENT_CLINIC_OR_DEPARTMENT_OTHER): Payer: Commercial Managed Care - HMO | Admitting: Oncology

## 2014-12-06 ENCOUNTER — Encounter (HOSPITAL_COMMUNITY): Payer: Commercial Managed Care - HMO | Attending: Hematology & Oncology

## 2014-12-06 VITALS — BP 110/62 | HR 70 | Temp 98.3°F | Resp 18 | Wt 97.0 lb

## 2014-12-06 VITALS — BP 135/40 | HR 62 | Temp 98.3°F | Resp 16

## 2014-12-06 DIAGNOSIS — R112 Nausea with vomiting, unspecified: Secondary | ICD-10-CM | POA: Diagnosis present

## 2014-12-06 DIAGNOSIS — E876 Hypokalemia: Secondary | ICD-10-CM | POA: Insufficient documentation

## 2014-12-06 DIAGNOSIS — Z5111 Encounter for antineoplastic chemotherapy: Secondary | ICD-10-CM

## 2014-12-06 DIAGNOSIS — C787 Secondary malignant neoplasm of liver and intrahepatic bile duct: Secondary | ICD-10-CM | POA: Diagnosis not present

## 2014-12-06 DIAGNOSIS — C189 Malignant neoplasm of colon, unspecified: Secondary | ICD-10-CM | POA: Diagnosis not present

## 2014-12-06 LAB — COMPREHENSIVE METABOLIC PANEL
ALK PHOS: 218 U/L — AB (ref 38–126)
ALT: 18 U/L (ref 14–54)
AST: 24 U/L (ref 15–41)
Albumin: 3.2 g/dL — ABNORMAL LOW (ref 3.5–5.0)
Anion gap: 7 (ref 5–15)
BILIRUBIN TOTAL: 0.4 mg/dL (ref 0.3–1.2)
BUN: 16 mg/dL (ref 6–20)
CALCIUM: 8.6 mg/dL — AB (ref 8.9–10.3)
CO2: 28 mmol/L (ref 22–32)
CREATININE: 0.5 mg/dL (ref 0.44–1.00)
Chloride: 102 mmol/L (ref 101–111)
Glucose, Bld: 139 mg/dL — ABNORMAL HIGH (ref 65–99)
Potassium: 3.2 mmol/L — ABNORMAL LOW (ref 3.5–5.1)
Sodium: 137 mmol/L (ref 135–145)
TOTAL PROTEIN: 7.2 g/dL (ref 6.5–8.1)

## 2014-12-06 LAB — CBC WITH DIFFERENTIAL/PLATELET
BASOS ABS: 0 10*3/uL (ref 0.0–0.1)
Basophils Relative: 1 % (ref 0–1)
Eosinophils Absolute: 0.1 10*3/uL (ref 0.0–0.7)
Eosinophils Relative: 2 % (ref 0–5)
HEMATOCRIT: 28.2 % — AB (ref 36.0–46.0)
Hemoglobin: 8.9 g/dL — ABNORMAL LOW (ref 12.0–15.0)
LYMPHS ABS: 1.2 10*3/uL (ref 0.7–4.0)
LYMPHS PCT: 21 % (ref 12–46)
MCH: 27.6 pg (ref 26.0–34.0)
MCHC: 31.6 g/dL (ref 30.0–36.0)
MCV: 87.6 fL (ref 78.0–100.0)
Monocytes Absolute: 1.1 10*3/uL — ABNORMAL HIGH (ref 0.1–1.0)
Monocytes Relative: 19 % — ABNORMAL HIGH (ref 3–12)
NEUTROS ABS: 3.2 10*3/uL (ref 1.7–7.7)
Neutrophils Relative %: 57 % (ref 43–77)
Platelets: 169 10*3/uL (ref 150–400)
RBC: 3.22 MIL/uL — AB (ref 3.87–5.11)
RDW: 16 % — ABNORMAL HIGH (ref 11.5–15.5)
WBC: 5.6 10*3/uL (ref 4.0–10.5)

## 2014-12-06 MED ORDER — FLUOROURACIL CHEMO INJECTION 2.5 GM/50ML
400.0000 mg/m2 | Freq: Once | INTRAVENOUS | Status: AC
  Administered 2014-12-06: 600 mg via INTRAVENOUS
  Filled 2014-12-06: qty 12

## 2014-12-06 MED ORDER — DEXTROSE 5 % IV SOLN
Freq: Once | INTRAVENOUS | Status: AC
  Administered 2014-12-06: 13:00:00 via INTRAVENOUS

## 2014-12-06 MED ORDER — SODIUM CHLORIDE 0.9 % IV SOLN
Freq: Once | INTRAVENOUS | Status: AC
  Administered 2014-12-06: 13:00:00 via INTRAVENOUS
  Filled 2014-12-06: qty 5

## 2014-12-06 MED ORDER — SODIUM CHLORIDE 0.9 % IV SOLN
2400.0000 mg/m2 | INTRAVENOUS | Status: DC
  Administered 2014-12-06: 3500 mg via INTRAVENOUS
  Filled 2014-12-06: qty 70

## 2014-12-06 MED ORDER — OXALIPLATIN CHEMO INJECTION 100 MG/20ML
68.0000 mg/m2 | Freq: Once | INTRAVENOUS | Status: AC
  Administered 2014-12-06: 100 mg via INTRAVENOUS
  Filled 2014-12-06: qty 20

## 2014-12-06 MED ORDER — SODIUM CHLORIDE 0.9 % IJ SOLN: 10.0000 mL | INTRAMUSCULAR | Status: DC | PRN

## 2014-12-06 MED ORDER — LEUCOVORIN CALCIUM INJECTION 350 MG
400.0000 mg/m2 | Freq: Once | INTRAVENOUS | Status: AC
  Administered 2014-12-06: 584 mg via INTRAVENOUS
  Filled 2014-12-06: qty 29.2

## 2014-12-06 MED ORDER — PALONOSETRON HCL INJECTION 0.25 MG/5ML
0.2500 mg | Freq: Once | INTRAVENOUS | Status: AC
  Administered 2014-12-06: 0.25 mg via INTRAVENOUS

## 2014-12-06 NOTE — Patient Instructions (Signed)
..  [place] at [facility] Discharge Instructions  RECOMMENDATIONS MADE BY THE CONSULTANT AND ANY TEST RESULTS WILL BE SENT TO YOUR REFERRING PHYSICIAN.  We will be adding Avastin to you treatment regimen.  This works with your chemotherapy to optimize results.  Increase your potassium to three times a day, starting today.  Continue your current bowel regimen.  You will be seen by the physician in two weeks as scheduled.  Please call us with any questions or concerns.      Thank you for choosing [place] at [facility] to provide your oncology and hematology care.  To afford each patient quality time with our provider, please arrive at least 15 minutes before your scheduled appointment time.    You need to re-schedule your appointment should you arrive 10 or more minutes late.  We strive to give you quality time with our providers, and arriving late affects you and other patients whose appointments are after yours.  Also, if you no show three or more times for appointments you may be dismissed from the clinic at the providers discretion.     Again, thank you for choosing [facility]  Our hope is that these requests will decrease the amount of time that you wait before being seen by our physicians.       _____________________________________________________________  Should you have questions after your visit to [facility], please contact our office at [phone] between the hours of 8:30 a.m. and 4:30 p.m.  Voicemails left after 4:30 p.m. will not be returned until the following business day.  For prescription refill requests, have your pharmacy contact our office.

## 2014-12-06 NOTE — Progress Notes (Signed)
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 Michael Boston on 08/31/2014 when she was admitted with SBO.     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 Diffuse liver metastases   07/02/2014 Imaging Changes consistent with the known history of colon carcinoma with metastatic disease involving the liver and peritoneum. Recent stent placement within the colonic lesion. Some fecal material is noted within the proximal portion of the stent which a...   07/05/2014 Pathology Results Liver, needle/core biopsy, left lobe - METASTATIC ADENOCARCINOMA   07/19/2014 - 08/16/2014 Chemotherapy FOLFOX   07/23/2014 Imaging Similar findings of advanced stage IV metastatic colon cancer with perhaps slight interval progression of disease in the liver.   08/25/2014 Treatment Plan Change Chemotherapy held due to hospitalization   08/25/2014 - 09/21/2014 Hospital Admission SBO   08/26/2014 Imaging High-grade distal small bowel obstruction, likely from peritoneal metastatic disease.  Extensive hepatic metastatic disease. The largest metastasis in the left lobe has decreased from 07/23/2014.   08/31/2014 Surgery Colon, segmental resection for tumor, rectum - INVASIVE ADENOCARCINOMA, MODERATELY DIFFERENTIATED - TUMOR INVADES THROUGH SEROSA, SEE COMMENT. - PERFORATION WITH VISIBLE  STENT. - LYMPHOVASCULAR AND PERINEURAL INVASION IS PRESENT. - DISTAL AND PROX...   09/15/2014 Imaging No bowel obstruction suspected status post multifocal small and large bowel resection with both primary reanastomoses and descending colostomy. Patulous appearance of colon in the right abdomen might reflect focal ileus.   11/06/2014 -  Chemotherapy FOLFOX   11/28/2014 - 11/30/2014 Hospital Admission 1.   Nausea and vomiting 2.   Constipation     I personally reviewed and went over laboratory results with the patient.  The results are noted within this dictation.  Chart reviewed.  Recent hospitalization noted.   She notes her pressure sore is healed and she has been discharged from the wound clinic.  As a result, we will need to consider starting Avastin as part of her treatment plan.  I will built this plan to begin with her next treatment.  I provided her education regarding Avastin.  I discussed the risks, benefits, alternatives, and side effects of this treatment.  Her bowels are regulated now on current bowel regimen.  She is up for treatment.  She denies any issues.  Past Medical History  Diagnosis Date  . Crohn's disease   . Chronic headache   . Cancer   . Nausea and vomiting 08/25/2014  . 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     is allergic to avelox; codeine; dextromethorphan; erythromycin;  penicillins; percocet; reglan; zofran; and suprep.  Current Outpatient Prescriptions on File Prior to Visit  Medication Sig Dispense Refill  . acetaminophen (TYLENOL) 325 MG tablet Take 325 mg by mouth every 6 (six) hours as needed for fever or headache (headahce).     . ALPRAZolam (XANAX) 0.5 MG tablet Take 0.5 mg by mouth at bedtime as needed for sleep.     . cromolyn (NASALCROM) 5.2 MG/ACT nasal spray Place 1 spray into both nostrils at bedtime.    . Cyanocobalamin (VITAMIN B-12 CR) 1500 MCG TBCR Take 1 tablet by mouth daily.    Marland Kitchen dexamethasone (DECADRON) 4 MG tablet Take 1 tablet (4 mg total) by mouth 2 (two) times daily. For 2 days. Begin day of pump disconnect. 8 tablet 1  . ENSURE (ENSURE) Take 237 mLs by mouth 2 (two) times daily with a meal.    . Fluorouracil (ADRUCIL IV) Inject into the vein every 14 (fourteen) days.    Marland Kitchen leucovorin in dextrose 5 % 250 mL Inject into the vein every 14 (fourteen) days.    Marland Kitchen lidocaine-prilocaine (EMLA) cream Apply 1 application topically as needed. Apply to portacath site 1-2 hours prior to use 30 g 3  . LORazepam (ATIVAN) 0.5 MG tablet Take 1 tablet (0.5 mg total) by mouth every 6 (six) hours as needed for anxiety. 30 tablet 0  . Multiple Vitamins-Minerals (MULTIVITAMIN WITH MINERALS) tablet Take 1 tablet by mouth daily.    . nicotine (NICODERM CQ - DOSED IN MG/24 HR) 7 mg/24hr patch Place 7 mg onto the skin daily.    Marland Kitchen oxaliplatin in dextrose 5 % 500 mL Inject into the vein every 14 (fourteen) days.    . traMADol (ULTRAM) 50 MG tablet Take 50 mg by mouth every 6 (six) hours as needed for moderate pain.     Marland Kitchen lactulose (CHRONULAC) 10 GM/15ML solution Take 30 mLs (20 g total) by mouth daily as needed for mild constipation. (Patient not taking: Reported on 12/06/2014) 946 mL 0  . Potassium Bicarb-Citric Acid 20 MEQ TBEF Take 1 tablet (20 mEq total) by mouth daily. Dissolve in ginger ale (Patient not taking: Reported on 12/06/2014) 30 each 1  . rolapitant  (VARUBI) 90 MG tablet Take 180 mg by mouth once.     Current Facility-Administered Medications on File Prior to Visit  Medication Dose Route Frequency Provider Last Rate Last Dose  . fluorouracil (ADRUCIL) 3,500 mg in sodium chloride 0.9 % 80 mL chemo infusion  2,400 mg/m2 (Treatment Plan Actual) Intravenous 1 day or 1 dose Patrici Ranks, MD   3,500 mg at 12/06/14 1510  . sodium chloride 0.9 % injection 10 mL  10 mL Intracatheter PRN Patrici Ranks, MD        Past Surgical History  Procedure Laterality Date  . Breast surgery    . Uterine fibroid surgery    . Colonoscopy w/ biopsies  06/29/2014    DR HUNG  . Flexible sigmoidoscopy N/A 06/30/2014    Procedure: FLEXIBLE SIGMOIDOSCOPY;  Surgeon: Carol Ada, MD;  Location: [facility] ENDOSCOPY;  Service: Endoscopy;  Laterality: N/A;  . Colonic stent placement N/A 06/30/2014    Procedure: COLONIC STENT PLACEMENT;  Surgeon: Carol Ada, MD;  Location: [facility] ENDOSCOPY;  Service: Endoscopy;  Laterality: N/A;  with fluro   . Portacath placement N/A 07/04/2014    Procedure: POWER PORT PLACEMENT;  Surgeon: Alphonsa Overall, MD;  Location: [place];  Service: General;  Laterality: N/A;  . Laparotomy N/A 08/31/2014  Procedure: EXPLORATORY LAPAROTOMY;  Surgeon: Michael Boston, MD;  Location: [facility];  Service: General;  Laterality: N/A;  . Bowel resection N/A 08/31/2014    Procedure: SMALL BOWEL RESECTION;  Surgeon: Michael Boston, MD;  Location: [facility];  Service: General;  Laterality: N/A;  . Colostomy N/A 08/31/2014    Procedure: low anterior resection with COLOSTOMY;  Surgeon: Michael Boston, MD;  Location: [facility];  Service: General;  Laterality: N/A;  . Appendectomy  08/31/2014    Procedure: APPENDECTOMY;  Surgeon: Michael Boston, MD;  Location: [facility];  Service: General;;  . Supracervical abdominal hysterectomy  08/31/2014    Procedure: HYSTERECTOMY SUPRACERVICAL ABDOMINAL BILATERAL TUBES AND OVARIES;  Surgeon: Michael Boston, MD;  Location: [facility];  Service: General;;  .  Transverse colon resection  08/31/2014    Procedure: TRANSVERSE COLON RESECTION;  Surgeon: Michael Boston, MD;  Location: [facility];  Service: General;;  . Debulking  08/31/2014    Procedure: DEBULKING OF PERITONEUM;  Surgeon: Michael Boston, MD;  Location: [facility];  Service: General;;    Denies any headaches, dizziness, double vision, fevers, chills, night sweats, nausea, vomiting, diarrhea, constipation, chest pain, heart palpitations, shortness of breath, blood in stool, black tarry stool, urinary pain, urinary burning, urinary frequency, hematuria.   PHYSICAL EXAMINATION  ECOG PERFORMANCE STATUS: 1 - Symptomatic but completely ambulatory  Filed Vitals:   12/06/14 1007  BP: 110/62  Pulse: 70  Temp: 98.3 F (36.8 C)  Resp: 18    GENERAL:alert, no distress, cachectic, comfortable and smiling, accompanied by her husband, in chemo-bed SKIN: skin color, texture, turgor are normal, no rashes or significant lesions HEAD: Normocephalic, No masses, lesions, tenderness or abnormalities EYES: normal, PERRLA, EOMI, Conjunctiva are pink and non-injected EARS: External ears normal OROPHARYNX:lips, buccal mucosa, and tongue normal and mucous membranes are moist  NECK: supple, no adenopathy, thyroid normal size, non-tender, without nodularity, no stridor, non-tender, trachea midline LYMPH:  no palpable lymphadenopathy, no hepatosplenomegaly BREAST:not examined LUNGS: clear to auscultation  HEART: regular rate & rhythm, no murmurs and no gallops ABDOMEN:abdomen soft, normal bowel sounds and colostomy producing stool appropriately. BACK: Back symmetric, no curvature. EXTREMITIES:less then 2 second capillary refill, no joint deformities, effusion, or inflammation, no skin discoloration  NEURO: alert & oriented x 3 with fluent speech, no focal motor/sensory deficits   LABORATORY DATA: CBC    Component Value Date/Time   WBC 5.6 12/06/2014 1000   WBC 5.5 08/16/2014 1037   RBC 3.22* 12/06/2014 1000    RBC 2.71* 09/12/2014 0515   RBC 3.61* 08/16/2014 1037   HGB 8.9* 12/06/2014 1000   HGB 9.6* 08/16/2014 1037   HCT 28.2* 12/06/2014 1000   HCT 30.9* 08/16/2014 1037   PLT 169 12/06/2014 1000   PLT 167 08/16/2014 1037   MCV 87.6 12/06/2014 1000   MCV 85.6 08/16/2014 1037   MCH 27.6 12/06/2014 1000   MCH 26.6 08/16/2014 1037   MCHC 31.6 12/06/2014 1000   MCHC 31.1* 08/16/2014 1037   RDW 16.0* 12/06/2014 1000   RDW 16.1* 08/16/2014 1037   LYMPHSABS 1.2 12/06/2014 1000   LYMPHSABS 1.2 08/16/2014 1037   MONOABS 1.1* 12/06/2014 1000   MONOABS 0.9 08/16/2014 1037   EOSABS 0.1 12/06/2014 1000   EOSABS 0.2 08/16/2014 1037   BASOSABS 0.0 12/06/2014 1000   BASOSABS 0.0 08/16/2014 1037      Chemistry      Component Value Date/Time   NA 137 12/06/2014 1000   NA 140 08/16/2014 1037   K 3.2* 12/06/2014  1000   K 4.2 08/16/2014 1037   CL 102 12/06/2014 1000   CO2 28 12/06/2014 1000   CO2 28 08/16/2014 1037   BUN 16 12/06/2014 1000   BUN 23.1 08/16/2014 1037   CREATININE 0.50 12/06/2014 1000   CREATININE 0.7 08/16/2014 1037      Component Value Date/Time   CALCIUM 8.6* 12/06/2014 1000   CALCIUM 9.5 08/16/2014 1037   ALKPHOS 218* 12/06/2014 1000   ALKPHOS 143 08/16/2014 1037   AST 24 12/06/2014 1000   AST 21 08/16/2014 1037   ALT 18 12/06/2014 1000   ALT 15 08/16/2014 1037   BILITOT 0.4 12/06/2014 1000   BILITOT 0.28 08/16/2014 1037     Lab Results  Component Value Date   CEA 26.3* 11/06/2014     PENDING LABS:   RADIOGRAPHIC STUDIES:  Dg Abd Acute W/chest  11/28/2014   CLINICAL DATA:  Nausea vomiting today. Patient is currently on chemotherapy for colon cancer.  EXAM: DG ABDOMEN ACUTE W/ 1V CHEST  COMPARISON:  None.  FINDINGS: There is no evidence of dilated bowel loops or free intraperitoneal air. Left lower quadrant ostomy is identified. No radiopaque calculi or other significant radiographic abnormality is seen. Heart size and mediastinal contours are within normal  limits. Left central venous line is identified. Both lungs are clear.  IMPRESSION: Negative abdominal radiographs.  No acute cardiopulmonary disease.   Electronically Signed   By: Abelardo Diesel M.D.   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 Michael Boston on 08/31/2014 when she was admitted with SBO.   Chart reviewed and her recent hospitalization is noted.  Oncology history updated.  Restarted FOLFOX on 11/06/2014 but treatment has been complicated by nausea and vomiting, despite multiple anti-emetic regimens.  Recent dose reduction in Oxaliplatin by 20% is noted to help with nausea and hopefully increase appetite.    Her pressure sore is completely healed at this time.  As a result, I built Avastin antibody plan to begin in 2 weeks with her next treatment.  I discussed risks, benefits, alternatives, and side effects of Avastin.  She is agreeable to the addition of this medication, which is indicated in the metastatic setting.  I reviewed her pre-meds with the patient.  Return in 2 weeks for follow-up.    THERAPY PLAN:  Continue treatment as planned.  Will add Avastin for next cycle of treatment.  All questions were answered. The patient knows to call the clinic with any problems, questions or concerns. We can certainly see the patient much sooner if necessary.  Patient and plan discussed with Dr. Ancil Linsey and she is in agreement with the aforementioned.   This note is electronically signed by: Doy Mince 12/06/2014 4:54 PM

## 2014-12-06 NOTE — Assessment & Plan Note (Addendum)
(  [id number]) adenocarcinoma of rectosigmoid colon with metastases to liver, done of bladder, appendix, small intestine (ileum), omentum, right pelvic side wall soft tissue, and peritoneum on exploratory laparotomy by Dr. Michael Boston on 08/31/2014 when she was admitted with SBO.   Chart reviewed and her recent hospitalization is noted.  Oncology history updated.  Restarted FOLFOX on 11/06/2014 but treatment has been complicated by nausea and vomiting, despite multiple anti-emetic regimens.  Recent dose reduction in Oxaliplatin by 20% is noted to help with nausea and hopefully increase appetite.    Her pressure sore is completely healed at this time.  As a result, I built Avastin antibody plan to begin in 2 weeks with her next treatment.  I discussed risks, benefits, alternatives, and side effects of Avastin.  She is agreeable to the addition of this medication, which is indicated in the metastatic setting.  I reviewed her pre-meds with the patient.  Return in 2 weeks for follow-up.

## 2014-12-06 NOTE — Patient Instructions (Signed)
[facility] Discharge Instructions for Patients Receiving Chemotherapy  Today you received the following chemotherapy agents:  Oxaliplatin, 72fu, and leucovorin  If you develop nausea and vomiting, or diarrhea that is not controlled by your medication, call the clinic.  The clinic phone number is [phone]. Office hours are Monday-Friday 8:30am-5:00pm.  BELOW ARE SYMPTOMS THAT SHOULD BE REPORTED IMMEDIATELY:  *FEVER GREATER THAN 101.0 F  *CHILLS WITH OR WITHOUT FEVER  NAUSEA AND VOMITING THAT IS NOT CONTROLLED WITH YOUR NAUSEA MEDICATION  *UNUSUAL SHORTNESS OF BREATH  *UNUSUAL BRUISING OR BLEEDING  TENDERNESS IN MOUTH AND THROAT WITH OR WITHOUT PRESENCE OF ULCERS  *URINARY PROBLEMS  *BOWEL PROBLEMS  UNUSUAL RASH Items with * indicate a potential emergency and should be followed up as soon as possible. If you have an emergency after office hours please contact your primary care physician or go to the nearest emergency department.  Please call the clinic during office hours if you have any questions or concerns.   You may also contact the Patient Navigator at [phone] should you have any questions or need assistance in obtaining follow up care. _____________________________________________________________________ Have you asked about our STAR program?    STAR stands for Survivorship Training and Rehabilitation, and this is a nationally recognized cancer care program that focuses on survivorship and rehabilitation.  Cancer and cancer treatments may cause problems, such as, pain, making you feel tired and keeping you from doing the things that you need or want to do. Cancer rehabilitation can help. Our goal is to reduce these troubling effects and help you have the best quality of life possible.  You may receive a survey from a nurse that asks questions about your current state of health.  Based on the survey results, all eligible patients will be referred to  the [facility] program for an evaluation so we can better serve you! A frequently asked questions sheet is available upon request.

## 2014-12-06 NOTE — Progress Notes (Signed)
Tolerated treatment w/o adverse reaction.  A&Ox4; in no distress.  VSS.  Discharged ambulatory in c/o spouse for transport home.  See office visit encounter for assessment and other details.

## 2014-12-07 LAB — CEA: CEA: 20.8 ng/mL — AB (ref 0.0–4.7)

## 2014-12-08 ENCOUNTER — Encounter (HOSPITAL_BASED_OUTPATIENT_CLINIC_OR_DEPARTMENT_OTHER): Payer: Commercial Managed Care - HMO

## 2014-12-08 DIAGNOSIS — C189 Malignant neoplasm of colon, unspecified: Secondary | ICD-10-CM

## 2014-12-08 DIAGNOSIS — C787 Secondary malignant neoplasm of liver and intrahepatic bile duct: Secondary | ICD-10-CM | POA: Diagnosis not present

## 2014-12-08 DIAGNOSIS — Z452 Encounter for adjustment and management of vascular access device: Secondary | ICD-10-CM | POA: Diagnosis not present

## 2014-12-08 MED ORDER — HEPARIN SOD (PORK) LOCK FLUSH 100 UNIT/ML IV SOLN
500.0000 [IU] | Freq: Once | INTRAVENOUS | Status: AC
  Administered 2014-12-08: 500 [IU] via INTRAVENOUS

## 2014-12-08 MED ORDER — SODIUM CHLORIDE 0.9 % IJ SOLN
10.0000 mL | INTRAMUSCULAR | Status: DC | PRN
  Administered 2014-12-08: 10 mL via INTRAVENOUS
  Filled 2014-12-08: qty 10

## 2014-12-08 MED ORDER — HEPARIN SOD (PORK) LOCK FLUSH 100 UNIT/ML IV SOLN
INTRAVENOUS | Status: AC
  Filled 2014-12-08: qty 5

## 2014-12-08 NOTE — Patient Instructions (Signed)
[place] at [facility] Discharge Instructions  RECOMMENDATIONS MADE BY THE CONSULTANT AND ANY TEST RESULTS WILL BE SENT TO YOUR REFERRING PHYSICIAN.  Discontinue pump and port flush today. Return as scheduled for chemotherapy.  Thank you for choosing [place] at [facility] to provide your oncology and hematology care.  To afford each patient quality time with our provider, please arrive at least 15 minutes before your scheduled appointment time.    You need to re-schedule your appointment should you arrive 10 or more minutes late.  We strive to give you quality time with our providers, and arriving late affects you and other patients whose appointments are after yours.  Also, if you no show three or more times for appointments you may be dismissed from the clinic at the providers discretion.     Again, thank you for choosing [facility].  Our hope is that these requests will decrease the amount of time that you wait before being seen by our physicians.       _____________________________________________________________  Should you have questions after your visit to [facility], please contact our office at [phone] between the hours of 8:30 a.m. and 4:30 p.m.  Voicemails left after 4:30 p.m. will not be returned until the following business day.  For prescription refill requests, have your pharmacy contact our office.

## 2014-12-08 NOTE — Progress Notes (Signed)
1400:  Sydney Morrison presented for home infusion pump removal and Portacath flush. Portacath flushed with 61ml NS and 500U/56ml Heparin and needle removed intact.  Procedure tolerated well and without incident.

## 2014-12-14 ENCOUNTER — Telehealth (HOSPITAL_COMMUNITY): Payer: Self-pay | Admitting: Hematology & Oncology

## 2014-12-14 NOTE — Telephone Encounter (Signed)
Patient ID: [id number]  Time: 12/14/2014 12:53 PM   Patient Name: Sydney Morrison, Sydney Morrison       This patient does not require prior authorization through the [facility]. However, if you would like to request a Pre-Determination, click CONTINUE. Otherwise, click CANCEL to close this request and return to the home screen.      PER [facility] [phone]

## 2014-12-19 NOTE — Patient Instructions (Signed)
[place]   CHEMOTHERAPY INSTRUCTIONS   Avastin - this medication is considered an anti-angiogenic therapy. Avastin is thought to work by causing the blood vessels to shrink away from the tumor, blocking the supply of oxygen and nutrients that the tumor needs to grow. Avastin may also cause the existing blood vessels to change in ways that help the chemotherapy reach the tumor more effectively. Avastin may also work to interfere with the growth of new blood vessels, causing the tumor to starve.   Most Common Side Effects: Nosebleeds, High Blood Pressure, Protein in Urine, & Diarrhea, Rectal Bleeding, Back pain, Headache, Taste change, Dry skin, Inflammation of the skin, Inflammation of teh nose, Watery Eyes   SYMPTOMS TO REPORT AS SOON AS POSSIBLE AFTER TREATMENT:  FEVER GREATER THAN 100.5 F  CHILLS WITH OR WITHOUT FEVER  NAUSEA AND VOMITING THAT IS NOT CONTROLLED WITH YOUR NAUSEA MEDICATION  UNUSUAL SHORTNESS OF BREATH  UNUSUAL BRUISING OR BLEEDING  TENDERNESS IN MOUTH AND THROAT WITH OR WITHOUT PRESENCE OF ULCERS  URINARY PROBLEMS  BOWEL PROBLEMS  UNUSUAL RASH    Wear comfortable clothing and clothing appropriate for easy access to any Portacath or PICC line. Let us know if there is anything that we can do to make your therapy better!      I have been informed and understand all of the instructions given to me and have received a copy. I have been instructed to call the clinic [phone] or my family physician as soon as possible for continued medical care, if indicated. I do not have any more questions at this time but understand that I may call the [facility] or the Patient Navigator at [phone] during office hours should I have questions or need assistance in obtaining follow-up care.           Bevacizumab injection What is this medicine? BEVACIZUMAB (be va SIZ yoo mab) is a chemotherapy drug. It targets a  protein found in many cancer cell types, and halts cancer growth. This drug treats many cancers including non-small cell lung cancer, ovarian cancer, cervical cancer, and colon or rectal cancer. It is usually given with other chemotherapy drugs. This medicine may be used for other purposes; ask your health care provider or pharmacist if you have questions. COMMON BRAND NAME(S): Avastin What should I tell my health care provider before I take this medicine? They need to know if you have any of these conditions: -blood clots -heart disease, including heart failure, heart attack, or chest pain (angina) -high blood pressure -infection (especially a virus infection such as chickenpox, cold sores, or herpes) -kidney disease -lung disease -prior chemotherapy with doxorubicin, daunorubicin, epirubicin, or other anthracycline type chemotherapy agents -recent or ongoing radiation therapy -recent surgery -stroke -an unusual or allergic reaction to bevacizumab, hamster proteins, mouse proteins, other medicines, foods, dyes, or preservatives -pregnant or trying to get pregnant -breast-feeding How should I use this medicine? This medicine is for infusion into a vein. It is given by a health care professional in a hospital or clinic setting. Talk to your pediatrician regarding the use of this medicine in children. Special care may be needed. Overdosage: If you think you have taken too much of this medicine contact a poison control center or emergency room at once. NOTE: This medicine is only for you. Do not share this medicine with others. What if I miss a dose? It is important not to miss your dose. Call your doctor  or health care professional if you are unable to keep an appointment. What may interact with this medicine? Interactions are not expected. This list may not describe all possible interactions. Give your health care provider a list of all the medicines, herbs, non-prescription drugs, or  dietary supplements you use. Also tell them if you smoke, drink alcohol, or use illegal drugs. Some items may interact with your medicine. What should I watch for while using this medicine? Your condition will be monitored carefully while you are receiving this medicine. You will need important blood work and urine testing done while you are taking this medicine. During your treatment, let your health care professional know if you have any unusual symptoms, such as difficulty breathing. This medicine may rarely cause 'gastrointestinal perforation' (holes in the stomach, intestines or colon), a serious side effect requiring surgery to repair. This medicine should be started at least 28 days following major surgery and the site of the surgery should be totally healed. Check with your doctor before scheduling dental work or surgery while you are receiving this treatment. Talk to your doctor if you have recently had surgery or if you have a wound that has not healed. Do not become pregnant while taking this medicine. Women should inform their doctor if they wish to become pregnant or think they might be pregnant. There is a potential for serious side effects to an unborn child. Talk to your health care professional or pharmacist for more information. Do not breast-feed an infant while taking this medicine. This medicine has caused ovarian failure in some women. This medicine may interfere with the ability to have a child. You should talk to your doctor or health care professional if you are concerned about your fertility. What side effects may I notice from receiving this medicine? Side effects that you should report to your doctor or health care professional as soon as possible: -allergic reactions like skin rash, itching or hives, swelling of the face, lips, or tongue -signs of infection - fever or chills, cough, sore throat, pain or trouble passing urine -signs of decreased platelets or bleeding -  bruising, pinpoint red spots on the skin, black, tarry stools, nosebleeds, blood in the urine -breathing problems -changes in vision -chest pain -confusion -jaw pain, especially after dental work -mouth sores -seizures -severe abdominal pain -severe headache -sudden numbness or weakness of the face, arm or leg -swelling of legs or ankles -symptoms of a stroke: change in mental awareness, inability to talk or move one side of the body (especially in patients with lung cancer) -trouble passing urine or change in the amount of urine -trouble speaking or understanding -trouble walking, dizziness, loss of balance or coordination Side effects that usually do not require medical attention (report to your doctor or health care professional if they continue or are bothersome): -constipation -diarrhea -dry skin -headache -loss of appetite -nausea, vomiting This list may not describe all possible side effects. Call your doctor for medical advice about side effects. You may report side effects to FDA at 1-800-FDA-1088. Where should I keep my medicine? This drug is given in a hospital or clinic and will not be stored at home. NOTE: This sheet is a summary. It may not cover all possible information. If you have questions about this medicine, talk to your doctor, pharmacist, or health care provider.  2015, [employer]. (2013-02-15 11:38:34)

## 2014-12-20 ENCOUNTER — Encounter (HOSPITAL_COMMUNITY): Payer: Self-pay | Admitting: Oncology

## 2014-12-20 ENCOUNTER — Encounter (HOSPITAL_COMMUNITY): Payer: Commercial Managed Care - HMO

## 2014-12-20 ENCOUNTER — Encounter (HOSPITAL_BASED_OUTPATIENT_CLINIC_OR_DEPARTMENT_OTHER): Payer: Commercial Managed Care - HMO | Admitting: Oncology

## 2014-12-20 ENCOUNTER — Encounter (HOSPITAL_BASED_OUTPATIENT_CLINIC_OR_DEPARTMENT_OTHER): Payer: Commercial Managed Care - HMO

## 2014-12-20 VITALS — BP 97/60 | HR 78 | Temp 98.7°F | Resp 16 | Wt 93.6 lb

## 2014-12-20 VITALS — BP 112/64 | HR 64 | Temp 98.1°F | Resp 16

## 2014-12-20 DIAGNOSIS — Z5111 Encounter for antineoplastic chemotherapy: Secondary | ICD-10-CM

## 2014-12-20 DIAGNOSIS — C189 Malignant neoplasm of colon, unspecified: Secondary | ICD-10-CM

## 2014-12-20 DIAGNOSIS — C19 Malignant neoplasm of rectosigmoid junction: Secondary | ICD-10-CM

## 2014-12-20 DIAGNOSIS — C787 Secondary malignant neoplasm of liver and intrahepatic bile duct: Secondary | ICD-10-CM

## 2014-12-20 DIAGNOSIS — Z5112 Encounter for antineoplastic immunotherapy: Secondary | ICD-10-CM | POA: Diagnosis not present

## 2014-12-20 DIAGNOSIS — C786 Secondary malignant neoplasm of retroperitoneum and peritoneum: Secondary | ICD-10-CM | POA: Diagnosis not present

## 2014-12-20 LAB — COMPREHENSIVE METABOLIC PANEL
ALT: 27 U/L (ref 14–54)
ANION GAP: 6 (ref 5–15)
AST: 37 U/L (ref 15–41)
Albumin: 3.5 g/dL (ref 3.5–5.0)
Alkaline Phosphatase: 261 U/L — ABNORMAL HIGH (ref 38–126)
BUN: 19 mg/dL (ref 6–20)
CALCIUM: 9 mg/dL (ref 8.9–10.3)
CHLORIDE: 104 mmol/L (ref 101–111)
CO2: 27 mmol/L (ref 22–32)
CREATININE: 0.53 mg/dL (ref 0.44–1.00)
Glucose, Bld: 106 mg/dL — ABNORMAL HIGH (ref 65–99)
Potassium: 3.9 mmol/L (ref 3.5–5.1)
SODIUM: 137 mmol/L (ref 135–145)
Total Bilirubin: 0.3 mg/dL (ref 0.3–1.2)
Total Protein: 7.8 g/dL (ref 6.5–8.1)

## 2014-12-20 LAB — URINALYSIS, ROUTINE W REFLEX MICROSCOPIC
BILIRUBIN URINE: NEGATIVE
Glucose, UA: NEGATIVE mg/dL
HGB URINE DIPSTICK: NEGATIVE
Ketones, ur: NEGATIVE mg/dL
NITRITE: NEGATIVE
PROTEIN: NEGATIVE mg/dL
Specific Gravity, Urine: 1.025 (ref 1.005–1.030)
UROBILINOGEN UA: 0.2 mg/dL (ref 0.0–1.0)
pH: 5.5 (ref 5.0–8.0)

## 2014-12-20 LAB — URINE MICROSCOPIC-ADD ON

## 2014-12-20 LAB — CBC WITH DIFFERENTIAL/PLATELET
Basophils Absolute: 0 10*3/uL (ref 0.0–0.1)
Basophils Relative: 0 %
EOS ABS: 0.1 10*3/uL (ref 0.0–0.7)
EOS PCT: 2 %
HCT: 31.2 % — ABNORMAL LOW (ref 36.0–46.0)
Hemoglobin: 9.7 g/dL — ABNORMAL LOW (ref 12.0–15.0)
LYMPHS ABS: 1.7 10*3/uL (ref 0.7–4.0)
LYMPHS PCT: 24 %
MCH: 27.6 pg (ref 26.0–34.0)
MCHC: 31.1 g/dL (ref 30.0–36.0)
MCV: 88.6 fL (ref 78.0–100.0)
MONO ABS: 1 10*3/uL (ref 0.1–1.0)
MONOS PCT: 14 %
Neutro Abs: 4.2 10*3/uL (ref 1.7–7.7)
Neutrophils Relative %: 60 %
PLATELETS: 198 10*3/uL (ref 150–400)
RBC: 3.52 MIL/uL — AB (ref 3.87–5.11)
RDW: 17.2 % — ABNORMAL HIGH (ref 11.5–15.5)
WBC: 7 10*3/uL (ref 4.0–10.5)

## 2014-12-20 MED ORDER — LEUCOVORIN CALCIUM INJECTION 350 MG
400.0000 mg/m2 | Freq: Once | INTRAVENOUS | Status: AC
  Administered 2014-12-20: 584 mg via INTRAVENOUS
  Filled 2014-12-20: qty 29.2

## 2014-12-20 MED ORDER — SODIUM CHLORIDE 0.9 % IV SOLN
Freq: Once | INTRAVENOUS | Status: AC
  Administered 2014-12-20: 11:00:00 via INTRAVENOUS
  Filled 2014-12-20: qty 5

## 2014-12-20 MED ORDER — DEXTROSE 5 % IV SOLN
Freq: Once | INTRAVENOUS | Status: AC
Start: 2014-12-20 — End: 2014-12-20
  Administered 2014-12-20: 11:00:00 via INTRAVENOUS

## 2014-12-20 MED ORDER — FLUOROURACIL CHEMO INJECTION 2.5 GM/50ML
400.0000 mg/m2 | Freq: Once | INTRAVENOUS | Status: AC
  Administered 2014-12-20: 600 mg via INTRAVENOUS
  Filled 2014-12-20: qty 12

## 2014-12-20 MED ORDER — SODIUM CHLORIDE 0.9 % IV SOLN
5.0000 mg/kg | Freq: Once | INTRAVENOUS | Status: AC
  Administered 2014-12-20: 225 mg via INTRAVENOUS
  Filled 2014-12-20: qty 9

## 2014-12-20 MED ORDER — SODIUM CHLORIDE 0.9 % IJ SOLN: 10.0000 mL | INTRAMUSCULAR | Status: DC | PRN

## 2014-12-20 MED ORDER — OXALIPLATIN CHEMO INJECTION 100 MG/20ML
68.0000 mg/m2 | Freq: Once | INTRAVENOUS | Status: AC
  Administered 2014-12-20: 100 mg via INTRAVENOUS
  Filled 2014-12-20: qty 20

## 2014-12-20 MED ORDER — SODIUM CHLORIDE 0.9 % IV SOLN
2400.0000 mg/m2 | INTRAVENOUS | Status: DC
  Administered 2014-12-20: 3500 mg via INTRAVENOUS
  Filled 2014-12-20: qty 70

## 2014-12-20 MED ORDER — HEPARIN SOD (PORK) LOCK FLUSH 100 UNIT/ML IV SOLN: 500.0000 [IU] | Freq: Once | INTRAVENOUS | Status: DC | PRN

## 2014-12-20 MED ORDER — PALONOSETRON HCL INJECTION 0.25 MG/5ML
0.2500 mg | Freq: Once | INTRAVENOUS | Status: AC
  Administered 2014-12-20: 0.25 mg via INTRAVENOUS
  Filled 2014-12-20: qty 5

## 2014-12-20 NOTE — Assessment & Plan Note (Addendum)
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 Michael Boston on 08/31/2014 when she was admitted with SBO.   Chart reviewed and her recent hospitalization is noted.  Oncology history updated.  Restarted FOLFOX on 11/06/2014 but treatment has been complicated by nausea and vomiting, despite multiple anti-emetic regimens.  Dose reduction in Oxaliplatin by 20% is noted to help with nausea and hopefully increase appetite.    Her pressure sore is completely healed at this time.  As a result, we will start Avastin today after we ascertain a UA.    Pre-chemo labs today: CBC diff, CMET, UA  Rash today is not noted on exam.  She notes that it is pruritic.  She is using an OTC antihistamine which is fine.  She will be receiving Decadron today and the next few days as a pre-med, as a result, this may be beneficial.  She is intolerant of a number of medications that she can use locally and therefore is not interested in these medication.  She denies any changes in her shampoo, detergents, soaps, etc.  She has spoken to an ostomy nurse who provided recommendations regarding her ostomy site.  She will be getting new samples of ostomy supplies later this week.  We discussed future scanning and she is agreeable to hold off on imaging as long as CEA declines and clinically she is improving.  We could scan her with any biochemical or clinical change that worries [place] about progression of disease.  Her last imaging was in June 2016.    She is provided education regarding CEA and what this marker means.  She thought that a normal CEA would mean she is cancer-free.  Education is provided regarding CEA.  Other factors can contribute to CEA values.  She quickly re-directed my conversation when I discussed the risk of increasing CEA and what that might mean  regarding her malignancy.  She did not want to go there today.  I hope this is not an indicator of future issues regarding reality and her malignancy.  Return in 2 weeks for follow-up.

## 2014-12-20 NOTE — Patient Instructions (Signed)
[place] at [facility] Discharge Instructions  RECOMMENDATIONS MADE BY THE CONSULTANT AND ANY TEST RESULTS WILL BE SENT TO YOUR REFERRING PHYSICIAN.  Exam and discussion by Robynn Pane, PA-C OK to use OTC allergy medication Will add avastin to your chemotherapy today. Call with uncontrolled nausea, vomiting, diarrhea or other concerns.  Follow-up in 2 weeks.  Thank you for choosing [place] at [facility] to provide your oncology and hematology care.  To afford each patient quality time with our provider, please arrive at least 15 minutes before your scheduled appointment time.    You need to re-schedule your appointment should you arrive 10 or more minutes late.  We strive to give you quality time with our providers, and arriving late affects you and other patients whose appointments are after yours.  Also, if you no show three or more times for appointments you may be dismissed from the clinic at the providers discretion.     Again, thank you for choosing [facility].  Our hope is that these requests will decrease the amount of time that you wait before being seen by our physicians.       _____________________________________________________________  Should you have questions after your visit to [facility], please contact our office at [phone] between the hours of 8:30 a.m. and 4:30 p.m.  Voicemails left after 4:30 p.m. will not be returned until the following business day.  For prescription refill requests, have your pharmacy contact our office.

## 2014-12-20 NOTE — Progress Notes (Signed)
Sanjuan Dame Tolerated chemotherapy well Discharged ambulatory with pump

## 2014-12-20 NOTE — Progress Notes (Signed)
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 Michael Boston on 08/31/2014 when she was admitted with SBO.     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 Diffuse liver metastases   07/02/2014 Imaging Changes consistent with the known history of colon carcinoma with metastatic disease involving the liver and peritoneum. Recent stent placement within the colonic lesion. Some fecal material is noted within the proximal portion of the stent which a...   07/05/2014 Pathology Results Liver, needle/core biopsy, left lobe - METASTATIC ADENOCARCINOMA   07/19/2014 - 08/16/2014 Chemotherapy FOLFOX   07/23/2014 Imaging Similar findings of advanced stage IV metastatic colon cancer with perhaps slight interval progression of disease in the liver.   08/25/2014 Treatment Plan Change Chemotherapy held due to hospitalization   08/25/2014 - 09/21/2014 Hospital Admission SBO   08/26/2014 Imaging High-grade distal small bowel obstruction, likely from peritoneal metastatic disease.  Extensive hepatic metastatic disease. The largest metastasis in the left lobe has decreased from 07/23/2014.   08/31/2014 Surgery Colon, segmental resection for tumor, rectum - INVASIVE ADENOCARCINOMA, MODERATELY DIFFERENTIATED - TUMOR INVADES THROUGH SEROSA, SEE COMMENT. - PERFORATION WITH  VISIBLE STENT. - LYMPHOVASCULAR AND PERINEURAL INVASION IS PRESENT. - DISTAL AND PROX...   09/15/2014 Imaging No bowel obstruction suspected status post multifocal small and large bowel resection with both primary reanastomoses and descending colostomy. Patulous appearance of colon in the right abdomen might reflect focal ileus.   11/06/2014 -  Chemotherapy FOLFOX   11/28/2014 - 11/30/2014 Hospital Admission 1.   Nausea and vomiting 2.   Constipation     I personally reviewed and went over laboratory results with the patient.  The results are noted within this dictation.  She tolerated her last cycle without any issues.  She reports a new rash.  It is pruritic.  She is using an OTC antihistamine with symptomatic success.  She denies any new changes in products that would come in contact with her skin.  She has also checked her products for any ingredient changes.  She admits that she has very sensitive skin to begin with and is therefore very careful about products she uses.  She notes that it started Friday.  She admits that it is improving.  It is located on lower abdomen and low back.  She denies any new medications, including OTC medication and herbal (holistic) interventions or treatments.   Past Medical History  Diagnosis Date  . Crohn's disease   . Chronic headache   . Cancer   . Nausea and vomiting 08/25/2014  . 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     is allergic to avelox; codeine; dextromethorphan; erythromycin; penicillins; percocet; reglan; zofran; and suprep.  Current Outpatient Prescriptions on File Prior to Visit  Medication Sig Dispense Refill  . acetaminophen (TYLENOL) 325 MG tablet Take 325 mg by mouth every 6 (six) hours as needed for fever or headache (headahce).     . ALPRAZolam (XANAX) 0.5 MG tablet Take 0.5 mg by mouth at bedtime as needed for sleep.     . cromolyn (NASALCROM) 5.2 MG/ACT nasal spray Place 1 spray into both nostrils at bedtime.    . Cyanocobalamin (VITAMIN B-12 CR) 1500 MCG TBCR Take 1 tablet by mouth daily.    Marland Kitchen dexamethasone (DECADRON) 4 MG tablet Take 1 tablet (4 mg total) by mouth 2 (two) times daily. For 2 days. Begin day of pump disconnect. 8 tablet 1  . ENSURE (ENSURE) Take 237 mLs by mouth 2 (two) times daily with a meal.    . Fluorouracil (ADRUCIL IV) Inject into the vein every 14 (fourteen) days.    . GENERLAC 10 GM/15ML SOLN     . leucovorin in dextrose 5 % 250 mL Inject into the vein every 14 (fourteen) days.    Marland Kitchen lidocaine-prilocaine (EMLA) cream Apply 1 application topically as needed. Apply to portacath site 1-2 hours prior to use 30 g 3  . Multiple Vitamins-Minerals (MULTIVITAMIN WITH MINERALS) tablet Take 1 tablet by mouth daily.    . nicotine (NICODERM CQ - DOSED IN MG/24 HR) 7 mg/24hr patch Place 7 mg onto the skin daily.    Marland Kitchen oxaliplatin in dextrose 5 % 500 mL Inject into the vein every 14 (fourteen) days.    . Potassium Bicarb-Citric Acid 20 MEQ TBEF Take 1 tablet (20 mEq total) by mouth daily. Dissolve in ginger ale 30 each 1  . fluconazole (DIFLUCAN) 150 MG tablet     . LORazepam (ATIVAN) 0.5 MG tablet Take 1 tablet (0.5 mg total) by mouth every 6 (six) hours as needed for anxiety. (Patient not taking: Reported on 12/20/2014) 30 tablet 0  . potassium bicarbonate (K-LYTE) 25 MEQ disintegrating tablet Take 25 mEq by mouth 2 (two) times  daily.    . traMADol (ULTRAM) 50 MG tablet Take 50 mg by mouth every 6 (six) hours as needed for moderate pain.      Current Facility-Administered Medications on File Prior to Visit  Medication Dose Route Frequency Provider Last Rate Last Dose  . dextrose 5 % solution   Intravenous Once Patrici Ranks, MD      . fluorouracil (ADRUCIL) 3,500 mg in sodium chloride 0.9 % 80 mL chemo infusion  2,400 mg/m2 (Treatment Plan Actual) Intravenous 1 day or 1 dose Patrici Ranks, MD      . fluorouracil (ADRUCIL) chemo injection 600 mg  400 mg/m2 (Treatment Plan Actual) Intravenous Once Patrici Ranks, MD      . fosaprepitant (EMEND) 150 mg, dexamethasone (DECADRON) 12 mg in sodium chloride 0.9 % 145 mL IVPB   Intravenous Once Patrici Ranks, MD      . heparin lock flush 100 unit/mL  500 Units Intracatheter Once PRN Patrici Ranks, MD      . leucovorin 584 mg in dextrose 5 % 250 mL infusion  400 mg/m2 (Treatment Plan Actual) Intravenous Once Patrici Ranks, MD      . oxaliplatin (ELOXATIN) 100 mg in dextrose 5 % 500 mL chemo infusion  68 mg/m2 (Treatment  Plan Actual) Intravenous Once Allene Pyo, MD      . palonosetron (ALOXI) injection 0.25 mg  0.25 mg Intravenous Once Allene Pyo, MD      . sodium chloride 0.9 % injection 10 mL  10 mL Intracatheter PRN Allene Pyo, MD        Past Surgical History  Procedure Laterality Date  . Breast surgery    . Uterine fibroid surgery    . Colonoscopy w/ biopsies  06/29/2014    DR HUNG  . Flexible sigmoidoscopy N/A 06/30/2014    Procedure: FLEXIBLE SIGMOIDOSCOPY;  Surgeon: Jeani Hawking, MD;  Location: [facility] ENDOSCOPY;  Service: Endoscopy;  Laterality: N/A;  . Colonic stent placement N/A 06/30/2014    Procedure: COLONIC STENT PLACEMENT;  Surgeon: Jeani Hawking, MD;  Location: [facility] ENDOSCOPY;  Service: Endoscopy;  Laterality: N/A;  with fluro   . Portacath placement N/A 07/04/2014    Procedure: POWER PORT PLACEMENT;  Surgeon: Ovidio Kin, MD;   Location: [facility] OR;  Service: General;  Laterality: N/A;  . Laparotomy N/A 08/31/2014    Procedure: EXPLORATORY LAPAROTOMY;  Surgeon: Karie Soda, MD;  Location: [facility] ORS;  Service: General;  Laterality: N/A;  . Bowel resection N/A 08/31/2014    Procedure: SMALL BOWEL RESECTION;  Surgeon: Karie Soda, MD;  Location: [facility] ORS;  Service: General;  Laterality: N/A;  . Colostomy N/A 08/31/2014    Procedure: low anterior resection with COLOSTOMY;  Surgeon: Karie Soda, MD;  Location: [facility] ORS;  Service: General;  Laterality: N/A;  . Appendectomy  08/31/2014    Procedure: APPENDECTOMY;  Surgeon: Karie Soda, MD;  Location: [facility] ORS;  Service: General;;  . Supracervical abdominal hysterectomy  08/31/2014    Procedure: HYSTERECTOMY SUPRACERVICAL ABDOMINAL BILATERAL TUBES AND OVARIES;  Surgeon: Karie Soda, MD;  Location: [facility] ORS;  Service: General;;  . Transverse colon resection  08/31/2014    Procedure: TRANSVERSE COLON RESECTION;  Surgeon: Karie Soda, MD;  Location: [facility] ORS;  Service: General;;  . Debulking  08/31/2014    Procedure: DEBULKING OF PERITONEUM;  Surgeon: Karie Soda, MD;  Location: [facility] ORS;  Service: General;;    Denies any headaches, dizziness, double vision, fevers, chills, night sweats, nausea, vomiting, diarrhea, constipation, chest pain, heart palpitations, shortness of breath, blood in stool, black tarry stool, urinary pain, urinary burning, urinary frequency, hematuria.   PHYSICAL EXAMINATION  ECOG PERFORMANCE STATUS: 1 - Symptomatic but completely ambulatory  Filed Vitals:   12/20/14 0900  BP: 97/60  Pulse: 78  Temp: 98.7 F (37.1 C)  Resp: 16    GENERAL:alert, no distress, cachectic, comfortable and smiling, accompanied by her husband, in chemo-bed SKIN: skin color, texture, turgor are normal, no rashes or significant lesions.  Rash not appreciated on exam. HEAD: Normocephalic, No masses, lesions, tenderness or abnormalities EYES: normal, PERRLA, EOMI, Conjunctiva are pink and  non-injected EARS: External ears normal OROPHARYNX:lips, buccal mucosa, and tongue normal and mucous membranes are moist  NECK: supple, no adenopathy, thyroid normal size, non-tender, without nodularity, no stridor, non-tender, trachea midline LYMPH:  no palpable lymphadenopathy, no hepatosplenomegaly BREAST:not examined LUNGS: clear to auscultation  HEART: regular rate & rhythm, no murmurs and no gallops ABDOMEN:abdomen soft, normal bowel sounds and colostomy producing stool appropriately. BACK: Back symmetric, no curvature. EXTREMITIES:less then 2 second capillary refill, no joint deformities, effusion, or inflammation, no skin discoloration  NEURO: alert & oriented x 3 with fluent speech, no focal motor/sensory deficits   LABORATORY DATA: CBC    Component Value Date/Time  WBC 7.0 12/20/2014 0900   WBC 5.5 08/16/2014 1037   RBC 3.52* 12/20/2014 0900   RBC 2.71* 09/12/2014 0515   RBC 3.61* 08/16/2014 1037   HGB 9.7* 12/20/2014 0900   HGB 9.6* 08/16/2014 1037   HCT 31.2* 12/20/2014 0900   HCT 30.9* 08/16/2014 1037   PLT 198 12/20/2014 0900   PLT 167 08/16/2014 1037   MCV 88.6 12/20/2014 0900   MCV 85.6 08/16/2014 1037   MCH 27.6 12/20/2014 0900   MCH 26.6 08/16/2014 1037   MCHC 31.1 12/20/2014 0900   MCHC 31.1* 08/16/2014 1037   RDW 17.2* 12/20/2014 0900   RDW 16.1* 08/16/2014 1037   LYMPHSABS 1.7 12/20/2014 0900   LYMPHSABS 1.2 08/16/2014 1037   MONOABS 1.0 12/20/2014 0900   MONOABS 0.9 08/16/2014 1037   EOSABS 0.1 12/20/2014 0900   EOSABS 0.2 08/16/2014 1037   BASOSABS 0.0 12/20/2014 0900   BASOSABS 0.0 08/16/2014 1037      Chemistry      Component Value Date/Time   NA 137 12/20/2014 0900   NA 140 08/16/2014 1037   K 3.9 12/20/2014 0900   K 4.2 08/16/2014 1037   CL 104 12/20/2014 0900   CO2 27 12/20/2014 0900   CO2 28 08/16/2014 1037   BUN 19 12/20/2014 0900   BUN 23.1 08/16/2014 1037   CREATININE 0.53 12/20/2014 0900   CREATININE 0.7 08/16/2014 1037       Component Value Date/Time   CALCIUM 9.0 12/20/2014 0900   CALCIUM 9.5 08/16/2014 1037   ALKPHOS 261* 12/20/2014 0900   ALKPHOS 143 08/16/2014 1037   AST 37 12/20/2014 0900   AST 21 08/16/2014 1037   ALT 27 12/20/2014 0900   ALT 15 08/16/2014 1037   BILITOT 0.3 12/20/2014 0900   BILITOT 0.28 08/16/2014 1037     Lab Results  Component Value Date   CEA 20.8* 12/06/2014     PENDING LABS:   RADIOGRAPHIC STUDIES:  Dg Abd Acute W/chest  11/28/2014   CLINICAL DATA:  Nausea vomiting today. Patient is currently on chemotherapy for colon cancer.  EXAM: DG ABDOMEN ACUTE W/ 1V CHEST  COMPARISON:  None.  FINDINGS: There is no evidence of dilated bowel loops or free intraperitoneal air. Left lower quadrant ostomy is identified. No radiopaque calculi or other significant radiographic abnormality is seen. Heart size and mediastinal contours are within normal limits. Left central venous line is identified. Both lungs are clear.  IMPRESSION: Negative abdominal radiographs.  No acute cardiopulmonary disease.   Electronically Signed   By: Abelardo Diesel M.D.   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 Michael Boston on 08/31/2014 when she was admitted with SBO.   Chart reviewed and her recent hospitalization is noted.  Oncology history updated.  Restarted FOLFOX on 11/06/2014 but treatment has been complicated by nausea and vomiting, despite multiple anti-emetic regimens.  Dose reduction in Oxaliplatin by 20% is noted to help with nausea and hopefully increase appetite.    Her pressure sore is completely healed at this time.  As a result, we will start Avastin today after we ascertain a UA.    Pre-chemo labs  today: CBC diff, CMET, UA  Rash today is not noted on exam.  She notes that  it is pruritic.  She is using an OTC antihistamine which is fine.  She will be receiving Decadron today and the next few days as a pre-med, as a result, this may be beneficial.  She is intolerant of a number of medications that she can use locally and therefore is not interested in these medication.  She denies any changes in her shampoo, detergents, soaps, etc.  She has spoken to an ostomy nurse who provided recommendations regarding her ostomy site.  She will be getting new samples of ostomy supplies later this week.  We discussed future scanning and she is agreeable to hold off on imaging as long as CEA declines and clinically she is improving.  We could scan her with any biochemical or clinical change that worries [place] about progression of disease.  Her last imaging was in June 2016.    She is provided education regarding CEA and what this marker means.  She thought that a normal CEA would mean she is cancer-free.  Education is provided regarding CEA.  Other factors can contribute to CEA values.  She quickly re-directed my conversation when I discussed the risk of increasing CEA and what that might mean regarding her malignancy.  She did not want to go there today.  I hope this is not an indicator of future issues regarding reality and her malignancy.  Return in 2 weeks for follow-up.    THERAPY PLAN:  Continue treatment as planned.  Will add Avastin beginning today now that pressure sore is healed.  All questions were answered. The patient knows to call the clinic with any problems, questions or concerns. We can certainly see the patient much sooner if necessary.  Patient and plan discussed with Dr. Ancil Linsey and she is in agreement with the aforementioned.   This note is electronically signed by: Doy Mince 12/20/2014 10:28 AM

## 2014-12-20 NOTE — Progress Notes (Signed)
Teaching for Avastin done today. Consent signed.

## 2014-12-20 NOTE — Patient Instructions (Signed)
[facility] Discharge Instructions for Patients Receiving Chemotherapy  Today you received the following chemotherapy agents folfox plus avastin Return as scheduled Please call the clinic if you have any questions or concerns  To help prevent nausea and vomiting after your treatment, we encourage you to take your nausea medication    If you develop nausea and vomiting, or diarrhea that is not controlled by your medication, call the clinic.  The clinic phone number is [phone]. Office hours are Monday-Friday 8:30am-5:00pm.  BELOW ARE SYMPTOMS THAT SHOULD BE REPORTED IMMEDIATELY:  *FEVER GREATER THAN 101.0 F  *CHILLS WITH OR WITHOUT FEVER  NAUSEA AND VOMITING THAT IS NOT CONTROLLED WITH YOUR NAUSEA MEDICATION  *UNUSUAL SHORTNESS OF BREATH  *UNUSUAL BRUISING OR BLEEDING  TENDERNESS IN MOUTH AND THROAT WITH OR WITHOUT PRESENCE OF ULCERS  *URINARY PROBLEMS  *BOWEL PROBLEMS  UNUSUAL RASH Items with * indicate a potential emergency and should be followed up as soon as possible. If you have an emergency after office hours please contact your primary care physician or go to the nearest emergency department.  Please call the clinic during office hours if you have any questions or concerns.   You may also contact the Patient Navigator at [phone] should you have any questions or need assistance in obtaining follow up care. _____________________________________________________________________ Have you asked about our STAR program?    STAR stands for Survivorship Training and Rehabilitation, and this is a nationally recognized cancer care program that focuses on survivorship and rehabilitation.  Cancer and cancer treatments may cause problems, such as, pain, making you feel tired and keeping you from doing the things that you need or want to do. Cancer rehabilitation can help. Our goal is to reduce these troubling effects and help you have the best quality of life  possible.  You may receive a survey from a nurse that asks questions about your current state of health.  Based on the survey results, all eligible patients will be referred to the [facility] program for an evaluation so we can better serve you! A frequently asked questions sheet is available upon request.

## 2014-12-22 ENCOUNTER — Encounter (HOSPITAL_BASED_OUTPATIENT_CLINIC_OR_DEPARTMENT_OTHER): Payer: Commercial Managed Care - HMO

## 2014-12-22 ENCOUNTER — Encounter (HOSPITAL_COMMUNITY): Payer: Self-pay

## 2014-12-22 VITALS — BP 121/65 | HR 66 | Temp 98.0°F | Resp 18

## 2014-12-22 DIAGNOSIS — C189 Malignant neoplasm of colon, unspecified: Secondary | ICD-10-CM | POA: Diagnosis not present

## 2014-12-22 DIAGNOSIS — C787 Secondary malignant neoplasm of liver and intrahepatic bile duct: Secondary | ICD-10-CM

## 2014-12-22 MED ORDER — SODIUM CHLORIDE 0.9 % IJ SOLN
10.0000 mL | INTRAMUSCULAR | Status: DC | PRN
  Administered 2014-12-22: 10 mL
  Filled 2014-12-22: qty 10

## 2014-12-22 MED ORDER — HEPARIN SOD (PORK) LOCK FLUSH 100 UNIT/ML IV SOLN
500.0000 [IU] | Freq: Once | INTRAVENOUS | Status: AC | PRN
  Administered 2014-12-22: 500 [IU]

## 2014-12-22 MED ORDER — HEPARIN SOD (PORK) LOCK FLUSH 100 UNIT/ML IV SOLN
INTRAVENOUS | Status: AC
  Filled 2014-12-22: qty 5

## 2014-12-22 NOTE — Patient Instructions (Signed)
Port flushed pump removed Follow up as scheduled Please call the clinic if you have any questions or concerns

## 2014-12-22 NOTE — Progress Notes (Signed)
Sanjuan Dame presented for Portacath de-access and flush.  Proper placement of portacath confirmed by CXR.  Portacath located left chest wall accessed with  H 20 needle.  Good blood return present. Portacath flushed with 51ml NS and 500U/84ml Heparin and needle removed intact.  Procedure tolerated well and without incident.  Pump re-moved

## 2015-01-03 ENCOUNTER — Encounter (HOSPITAL_COMMUNITY): Payer: Commercial Managed Care - HMO | Attending: Hematology & Oncology

## 2015-01-03 ENCOUNTER — Encounter (HOSPITAL_COMMUNITY): Payer: Self-pay | Admitting: Hematology & Oncology

## 2015-01-03 ENCOUNTER — Encounter (HOSPITAL_BASED_OUTPATIENT_CLINIC_OR_DEPARTMENT_OTHER): Payer: Commercial Managed Care - HMO | Admitting: Hematology & Oncology

## 2015-01-03 ENCOUNTER — Ambulatory Visit (HOSPITAL_COMMUNITY): Payer: Commercial Managed Care - HMO | Admitting: Oncology

## 2015-01-03 VITALS — BP 110/54 | HR 58 | Temp 98.1°F | Resp 16 | Wt 96.0 lb

## 2015-01-03 DIAGNOSIS — L89329 Pressure ulcer of left buttock, unspecified stage: Secondary | ICD-10-CM

## 2015-01-03 DIAGNOSIS — C787 Secondary malignant neoplasm of liver and intrahepatic bile duct: Secondary | ICD-10-CM

## 2015-01-03 DIAGNOSIS — C786 Secondary malignant neoplasm of retroperitoneum and peritoneum: Secondary | ICD-10-CM | POA: Diagnosis not present

## 2015-01-03 DIAGNOSIS — R110 Nausea: Secondary | ICD-10-CM

## 2015-01-03 DIAGNOSIS — C19 Malignant neoplasm of rectosigmoid junction: Secondary | ICD-10-CM | POA: Diagnosis not present

## 2015-01-03 DIAGNOSIS — D649 Anemia, unspecified: Secondary | ICD-10-CM | POA: Diagnosis not present

## 2015-01-03 DIAGNOSIS — C189 Malignant neoplasm of colon, unspecified: Secondary | ICD-10-CM | POA: Insufficient documentation

## 2015-01-03 DIAGNOSIS — Z5111 Encounter for antineoplastic chemotherapy: Secondary | ICD-10-CM | POA: Diagnosis not present

## 2015-01-03 DIAGNOSIS — Z5112 Encounter for antineoplastic immunotherapy: Secondary | ICD-10-CM

## 2015-01-03 DIAGNOSIS — E876 Hypokalemia: Secondary | ICD-10-CM | POA: Insufficient documentation

## 2015-01-03 DIAGNOSIS — R112 Nausea with vomiting, unspecified: Secondary | ICD-10-CM | POA: Insufficient documentation

## 2015-01-03 DIAGNOSIS — R64 Cachexia: Secondary | ICD-10-CM

## 2015-01-03 LAB — CBC WITH DIFFERENTIAL/PLATELET
Basophils Absolute: 0 10*3/uL (ref 0.0–0.1)
Basophils Relative: 0 %
EOS PCT: 2 %
Eosinophils Absolute: 0.2 10*3/uL (ref 0.0–0.7)
HCT: 32.5 % — ABNORMAL LOW (ref 36.0–46.0)
Hemoglobin: 10.1 g/dL — ABNORMAL LOW (ref 12.0–15.0)
LYMPHS ABS: 1.9 10*3/uL (ref 0.7–4.0)
LYMPHS PCT: 28 %
MCH: 27.9 pg (ref 26.0–34.0)
MCHC: 31.1 g/dL (ref 30.0–36.0)
MCV: 89.8 fL (ref 78.0–100.0)
MONO ABS: 1.1 10*3/uL — AB (ref 0.1–1.0)
MONOS PCT: 16 %
Neutro Abs: 3.7 10*3/uL (ref 1.7–7.7)
Neutrophils Relative %: 54 %
PLATELETS: 165 10*3/uL (ref 150–400)
RBC: 3.62 MIL/uL — ABNORMAL LOW (ref 3.87–5.11)
RDW: 19.1 % — AB (ref 11.5–15.5)
WBC: 6.8 10*3/uL (ref 4.0–10.5)

## 2015-01-03 LAB — COMPREHENSIVE METABOLIC PANEL
ALT: 44 U/L (ref 14–54)
AST: 45 U/L — ABNORMAL HIGH (ref 15–41)
Albumin: 3.6 g/dL (ref 3.5–5.0)
Alkaline Phosphatase: 215 U/L — ABNORMAL HIGH (ref 38–126)
Anion gap: 6 (ref 5–15)
BUN: 18 mg/dL (ref 6–20)
CHLORIDE: 103 mmol/L (ref 101–111)
CO2: 27 mmol/L (ref 22–32)
CREATININE: 0.55 mg/dL (ref 0.44–1.00)
Calcium: 8.4 mg/dL — ABNORMAL LOW (ref 8.9–10.3)
Glucose, Bld: 83 mg/dL (ref 65–99)
POTASSIUM: 3.8 mmol/L (ref 3.5–5.1)
Sodium: 136 mmol/L (ref 135–145)
Total Bilirubin: 0.2 mg/dL — ABNORMAL LOW (ref 0.3–1.2)
Total Protein: 7.4 g/dL (ref 6.5–8.1)

## 2015-01-03 LAB — URINALYSIS, DIPSTICK ONLY
BILIRUBIN URINE: NEGATIVE
Glucose, UA: NEGATIVE mg/dL
HGB URINE DIPSTICK: NEGATIVE
KETONES UR: NEGATIVE mg/dL
NITRITE: NEGATIVE
PROTEIN: NEGATIVE mg/dL
Specific Gravity, Urine: 1.025 (ref 1.005–1.030)
UROBILINOGEN UA: 0.2 mg/dL (ref 0.0–1.0)
pH: 6 (ref 5.0–8.0)

## 2015-01-03 MED ORDER — OXALIPLATIN CHEMO INJECTION 100 MG/20ML
68.0000 mg/m2 | Freq: Once | INTRAVENOUS | Status: AC
  Administered 2015-01-03: 100 mg via INTRAVENOUS
  Filled 2015-01-03: qty 20

## 2015-01-03 MED ORDER — PALONOSETRON HCL INJECTION 0.25 MG/5ML
0.2500 mg | Freq: Once | INTRAVENOUS | Status: AC
  Administered 2015-01-03: 0.25 mg via INTRAVENOUS

## 2015-01-03 MED ORDER — SODIUM CHLORIDE 0.9 % IJ SOLN
10.0000 mL | INTRAMUSCULAR | Status: DC | PRN
  Administered 2015-01-03: 10 mL
  Filled 2015-01-03: qty 10

## 2015-01-03 MED ORDER — FLUOROURACIL CHEMO INJECTION 2.5 GM/50ML
400.0000 mg/m2 | Freq: Once | INTRAVENOUS | Status: AC
  Administered 2015-01-03: 600 mg via INTRAVENOUS
  Filled 2015-01-03: qty 12

## 2015-01-03 MED ORDER — SODIUM CHLORIDE 0.9 % IV SOLN
Freq: Once | INTRAVENOUS | Status: AC
  Administered 2015-01-03: 15:00:00 via INTRAVENOUS

## 2015-01-03 MED ORDER — DEXTROSE 5 % IV SOLN
Freq: Once | INTRAVENOUS | Status: AC
  Administered 2015-01-03: 11:00:00 via INTRAVENOUS

## 2015-01-03 MED ORDER — SODIUM CHLORIDE 0.9 % IV SOLN
Freq: Once | INTRAVENOUS | Status: AC
  Administered 2015-01-03: 11:00:00 via INTRAVENOUS
  Filled 2015-01-03: qty 5

## 2015-01-03 MED ORDER — PALONOSETRON HCL INJECTION 0.25 MG/5ML
INTRAVENOUS | Status: AC
  Filled 2015-01-03: qty 5

## 2015-01-03 MED ORDER — LEUCOVORIN CALCIUM INJECTION 350 MG
400.0000 mg/m2 | Freq: Once | INTRAVENOUS | Status: AC
  Administered 2015-01-03: 584 mg via INTRAVENOUS
  Filled 2015-01-03: qty 29.2

## 2015-01-03 MED ORDER — SODIUM CHLORIDE 0.9 % IV SOLN
2400.0000 mg/m2 | INTRAVENOUS | Status: DC
  Administered 2015-01-03: 3500 mg via INTRAVENOUS
  Filled 2015-01-03: qty 70

## 2015-01-03 MED ORDER — SODIUM CHLORIDE 0.9 % IV SOLN
5.0000 mg/kg | Freq: Once | INTRAVENOUS | Status: AC
  Administered 2015-01-03: 225 mg via INTRAVENOUS
  Filled 2015-01-03: qty 9

## 2015-01-03 NOTE — Progress Notes (Signed)
Tolerated chemotherapy w/o adverse reaction.  VSS.  Discharged ambulatory.

## 2015-01-03 NOTE — Patient Instructions (Signed)
[place] at [facility] Discharge Instructions  RECOMMENDATIONS MADE BY THE CONSULTANT AND ANY TEST RESULTS WILL BE SENT TO YOUR REFERRING PHYSICIAN.  Return in two weeks for follow up as well as treatment.    Thank you for choosing [place] at [facility] to provide your oncology and hematology care.  To afford each patient quality time with our provider, please arrive at least 15 minutes before your scheduled appointment time.    You need to re-schedule your appointment should you arrive 10 or more minutes late.  We strive to give you quality time with our providers, and arriving late affects you and other patients whose appointments are after yours.  Also, if you no show three or more times for appointments you may be dismissed from the clinic at the providers discretion.     Again, thank you for choosing [facility].  Our hope is that these requests will decrease the amount of time that you wait before being seen by our physicians.       _____________________________________________________________  Should you have questions after your visit to [facility], please contact our office at [phone] between the hours of 8:30 a.m. and 4:30 p.m.  Voicemails left after 4:30 p.m. will not be returned until the following business day.  For prescription refill requests, have your pharmacy contact our office.

## 2015-01-03 NOTE — Progress Notes (Signed)
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 Diffuse liver metastases   07/02/2014 Imaging Changes consistent with the known history of colon carcinoma with metastatic disease involving the liver and peritoneum. Recent stent placement within the colonic lesion. Some fecal material is noted within the proximal portion of the stent which a...   07/05/2014 Pathology Results Liver, needle/core biopsy, left lobe - METASTATIC ADENOCARCINOMA   07/19/2014 - 08/16/2014 Chemotherapy FOLFOX   07/23/2014 Imaging Similar findings of advanced stage IV metastatic colon cancer with perhaps slight interval progression of disease in the liver.   08/25/2014 Treatment Plan Change Chemotherapy held due to hospitalization   08/25/2014 - 09/21/2014 Hospital Admission SBO   08/26/2014 Imaging High-grade distal small bowel obstruction, likely from peritoneal metastatic disease.  Extensive hepatic metastatic disease. The largest metastasis in the left lobe has decreased from 07/23/2014.   08/31/2014 Surgery Colon, segmental resection for tumor, rectum - INVASIVE ADENOCARCINOMA, MODERATELY DIFFERENTIATED - TUMOR INVADES THROUGH SEROSA, SEE COMMENT. - PERFORATION WITH VISIBLE STENT. - LYMPHOVASCULAR AND PERINEURAL INVASION IS PRESENT. - DISTAL AND PROX...   09/15/2014 Imaging No bowel obstruction suspected status post multifocal small and large bowel resection with both primary reanastomoses and descending colostomy. Patulous appearance of colon in the right abdomen might reflect focal ileus.   11/06/2014 -  Chemotherapy FOLFOX   11/28/2014 - 11/30/2014 Hospital Admission 1.   Nausea and vomiting 2.     Constipation     CURRENT THERAPY: FOLFOX/AVASTIN  INTERVAL HISTORY: Sydney Morrison 59 y.o. 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 Michael Boston on 08/31/2014 when she was admitted with SBO.   The patient presents today for treatment. She notes that her pain is tolerable.  She has been out shopping.  Denies any problems with appetite and mood.  She notes that she felt better this week than when she was in the hospital before.  She now has more energy and appetite and feels good.  Denies vomiting after Chemotherapy.  She notes her nausea is bad but she is able to 'eat' through the discomfort. Her nausea is not as severe as with prior cycles. She complains of minimal bleeding around her stoma.  She would like more colostomy bags.  She would like to get her flu shot after Chemotherapy next week.   Past Medical History  Diagnosis Date  . Crohn's disease ([place])   . Chronic headache   . Cancer ([place])   . Nausea and vomiting 08/25/2014  . 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 [id number]; Chronic blood loss anemia; Antineoplastic chemotherapy induced anemia; Dehydration; Chemotherapy-induced neuropathy ([place]); Nausea and vomiting; SBO (small bowel obstruction) ([place]); Atrial flutter ([place]); Goals of care, counseling/discussion; Screen for STD (sexually transmitted disease); Pressure ulcer, stage 1; Postoperative fever; Leucocytosis; Protein-calorie malnutrition, severe ([place]); Tobacco abuse; Encounter for palliative care; Hyponatremia; Intolerance, food ([place]); Intractable nausea and vomiting; Constipation; Anemia of chronic disease; and Thrombocytopenia ([place]) on her problem list.     is allergic to avelox; codeine; dextromethorphan;  erythromycin; penicillins; percocet; reglan;  zofran; and suprep.  Current Outpatient Prescriptions on File Prior to Visit  Medication Sig Dispense Refill  . acetaminophen (TYLENOL) 325 MG tablet Take 325 mg by mouth every 6 (six) hours as needed for fever or headache (headahce).     . ALPRAZolam (XANAX) 0.5 MG tablet Take 0.5 mg by mouth at bedtime as needed for sleep.     . Bevacizumab (AVASTIN IV) Inject into the vein every 14 (fourteen) days.    . chlorpheniramine (CHLOR-TRIMETON) 4 MG tablet Take 4 mg by mouth as needed for allergies. When takes only takes 1/2 tablet    . cromolyn (NASALCROM) 5.2 MG/ACT nasal spray Place 1 spray into both nostrils at bedtime.    . Cyanocobalamin (VITAMIN B-12 CR) 1500 MCG TBCR Take 1 tablet by mouth daily.    Marland Kitchen dexamethasone (DECADRON) 4 MG tablet Take 1 tablet (4 mg total) by mouth 2 (two) times daily. For 2 days. Begin day of pump disconnect. 8 tablet 1  . dextrose 5 % SOLN 1,000 mL with fluorouracil 5 GM/100ML SOLN Inject into the vein every 14 (fourteen) days.    . ENSURE (ENSURE) Take 237 mLs by mouth 2 (two) times daily with a meal.    . fluconazole (DIFLUCAN) 150 MG tablet     . Fluorouracil (ADRUCIL IV) Inject into the vein every 14 (fourteen) days.    . GENERLAC 10 GM/15ML SOLN     . LEUCOVORIN CALCIUM IV Inject into the vein. Every 14 days    . leucovorin in dextrose 5 % 250 mL Inject into the vein every 14 (fourteen) days.    Marland Kitchen lidocaine-prilocaine (EMLA) cream Apply 1 application topically as needed. Apply to portacath site 1-2 hours prior to use 30 g 3  . LORazepam (ATIVAN) 0.5 MG tablet Take 1 tablet (0.5 mg total) by mouth every 6 (six) hours as needed for anxiety. (Patient not taking: Reported on 12/20/2014) 30 tablet 0  . Multiple Vitamins-Minerals (MULTIVITAMIN WITH MINERALS) tablet Take 1 tablet by mouth daily.    . nicotine (NICODERM CQ - DOSED IN MG/24 HR) 7 mg/24hr patch Place 7 mg onto the skin daily.    Marland Kitchen oxaliplatin in dextrose 5 % 500 mL Inject into the vein every 14  (fourteen) days.    . OXALIPLATIN IV Inject into the vein. Every 14 days    . Potassium Bicarb-Citric Acid 20 MEQ TBEF Take 1 tablet (20 mEq total) by mouth daily. Dissolve in ginger ale 30 each 1  . potassium bicarbonate (K-LYTE) 25 MEQ disintegrating tablet Take 25 mEq by mouth 2 (two) times daily.    . traMADol (ULTRAM) 50 MG tablet Take 50 mg by mouth every 6 (six) hours as needed for moderate pain.      Current Facility-Administered Medications on File Prior to Visit  Medication Dose Route Frequency Provider Last Rate Last Dose  . bevacizumab (AVASTIN) 225 mg in sodium chloride 0.9 % 100 mL chemo infusion  5 mg/kg (Treatment Plan Actual) Intravenous Once Patrici Ranks, MD 654 mL/hr at 01/03/15 1459 225 mg at 01/03/15 1459  . fluorouracil (ADRUCIL) 3,500 mg in sodium chloride 0.9 % 80 mL chemo infusion  2,400 mg/m2 (Treatment Plan Actual) Intravenous 1 day or 1 dose Patrici Ranks, MD   3,500 mg at 01/03/15 1430  . sodium chloride 0.9 % injection 10 mL  10 mL Intracatheter PRN Patrici Ranks, MD        Past Surgical History  Procedure  Laterality Date  . Breast surgery    . Uterine fibroid surgery    . Colonoscopy w/ biopsies  06/29/2014    DR HUNG  . Flexible sigmoidoscopy N/A 06/30/2014    Procedure: FLEXIBLE SIGMOIDOSCOPY;  Surgeon: Carol Ada, MD;  Location: [facility] ENDOSCOPY;  Service: Endoscopy;  Laterality: N/A;  . Colonic stent placement N/A 06/30/2014    Procedure: COLONIC STENT PLACEMENT;  Surgeon: Carol Ada, MD;  Location: [facility] ENDOSCOPY;  Service: Endoscopy;  Laterality: N/A;  with fluro   . Portacath placement N/A 07/04/2014    Procedure: POWER PORT PLACEMENT;  Surgeon: Alphonsa Overall, MD;  Location: [facility];  Service: General;  Laterality: N/A;  . Laparotomy N/A 08/31/2014    Procedure: EXPLORATORY LAPAROTOMY;  Surgeon: Michael Boston, MD;  Location: [facility];  Service: General;  Laterality: N/A;  . Bowel resection N/A 08/31/2014    Procedure: SMALL BOWEL RESECTION;  Surgeon:  Michael Boston, MD;  Location: [facility];  Service: General;  Laterality: N/A;  . Colostomy N/A 08/31/2014    Procedure: low anterior resection with COLOSTOMY;  Surgeon: Michael Boston, MD;  Location: [facility];  Service: General;  Laterality: N/A;  . Appendectomy  08/31/2014    Procedure: APPENDECTOMY;  Surgeon: Michael Boston, MD;  Location: [facility];  Service: General;;  . Supracervical abdominal hysterectomy  08/31/2014    Procedure: HYSTERECTOMY SUPRACERVICAL ABDOMINAL BILATERAL TUBES AND OVARIES;  Surgeon: Michael Boston, MD;  Location: [facility];  Service: General;;  . Transverse colon resection  08/31/2014    Procedure: TRANSVERSE COLON RESECTION;  Surgeon: Michael Boston, MD;  Location: [facility];  Service: General;;  . Debulking  08/31/2014    Procedure: DEBULKING OF PERITONEUM;  Surgeon: Michael Boston, MD;  Location: [facility];  Service: General;;    Denies any headaches, dizziness, double vision, fevers, chills, night sweats, nausea, vomiting, diarrhea, constipation, chest pain, heart palpitations, shortness of breath, blood in stool, black tarry stool, urinary pain, urinary burning, urinary frequency, hematuria. Positive for nausea,  and malaise/fatigue.    Severe nausea with chemotherapy. Abdominal pain has decreased since last visit.    PHYSICAL EXAMINATION ECOG PERFORMANCE STATUS: 2 - Symptomatic, <50% confined to bed  There were no vitals filed for this visit.  GENERAL:alert, no distress, cachectic, cooperative, smiling and accompanied by her husband, Jenny Reichmann. SKIN: skin color, texture, turgor are normal HEAD: Normocephalic, No masses, lesions, tenderness or abnormalities EYES: normal, PERRLA, EOMI, Conjunctiva are pink and non-injected EARS: External ears normal OROPHARYNX:lips, buccal mucosa, and tongue normal and mucous membranes are moist  NECK: supple, trachea midline LYMPH:  not examined BREAST:not examined LUNGS: clear to auscultation and percussion HEART: regular rate & rhythm, no murmurs, no  gallops, S1 normal and S2 normal ABDOMEN:abdomen soft, non-tender, normal bowel sounds and ostomy noted.  Small stomal hernia BACK: Back symmetric, no curvature, sacral area with bandage, erythema noted EXTREMITIES:less then 2 second capillary refill, no joint deformities, effusion, or inflammation, no skin discoloration, no cyanosis  NEURO: alert & oriented x 3 with fluent speech, no focal motor/sensory deficits   LABORATORY DATA: I have reviewed the data below as listed.  CBC    Component Value Date/Time   WBC 6.8 01/03/2015 0926   WBC 5.5 08/16/2014 1037   RBC 3.62* 01/03/2015 0926   RBC 2.71* 09/12/2014 0515   RBC 3.61* 08/16/2014 1037   HGB 10.1* 01/03/2015 0926   HGB 9.6* 08/16/2014 1037   HCT 32.5* 01/03/2015 0926   HCT 30.9* 08/16/2014 1037   PLT 165 01/03/2015  0926   PLT 167 08/16/2014 1037   MCV 89.8 01/03/2015 0926   MCV 85.6 08/16/2014 1037   MCH 27.9 01/03/2015 0926   MCH 26.6 08/16/2014 1037   MCHC 31.1 01/03/2015 0926   MCHC 31.1* 08/16/2014 1037   RDW 19.1* 01/03/2015 0926   RDW 16.1* 08/16/2014 1037   LYMPHSABS 1.9 01/03/2015 0926   LYMPHSABS 1.2 08/16/2014 1037   MONOABS 1.1* 01/03/2015 0926   MONOABS 0.9 08/16/2014 1037   EOSABS 0.2 01/03/2015 0926   EOSABS 0.2 08/16/2014 1037   BASOSABS 0.0 01/03/2015 0926   BASOSABS 0.0 08/16/2014 1037      Chemistry      Component Value Date/Time   NA 136 01/03/2015 0926   NA 140 08/16/2014 1037   K 3.8 01/03/2015 0926   K 4.2 08/16/2014 1037   CL 103 01/03/2015 0926   CO2 27 01/03/2015 0926   CO2 28 08/16/2014 1037   BUN 18 01/03/2015 0926   BUN 23.1 08/16/2014 1037   CREATININE 0.55 01/03/2015 0926   CREATININE 0.7 08/16/2014 1037      Component Value Date/Time   CALCIUM 8.4* 01/03/2015 0926   CALCIUM 9.5 08/16/2014 1037   ALKPHOS 215* 01/03/2015 0926   ALKPHOS 143 08/16/2014 1037   AST 45* 01/03/2015 0926   AST 21 08/16/2014 1037   ALT 44 01/03/2015 0926   ALT 15 08/16/2014 1037   BILITOT 0.2*  01/03/2015 0926   BILITOT 0.28 08/16/2014 1037     Lab Results  Component Value Date   CEA 20.8* 12/06/2014    PATHOLOGY:  Diagnosis 1. Soft tissue mass, biopsy, peritoneal mass dome of bladder - METASTATIC ADENOCARCINOMA. 2. Appendix, Incidental - METASTATIC ADENOCARCINOMA. 3. Small intestine, resection, ileum - METASTATIC ADENOCARCINOMA. - RESECTION MARGINS ARE NEGATIVE. 4. Colon, segmental resection, with omentum, transverse - METASTATIC ADENOCARCINOMA INVOLVING COLON AND OMENTUM. - RESECTION MARGINS ARE NEGATIVE. 5. Soft tissue, biopsy, right pelvic sidewall nodule - METASTATIC ADENOCARCINOMA. 6. Soft tissue mass, biopsy, anterior peritoneum mass near dome - METASTATIC ADENOCARCINOMA. 7. Colon, segmental resection for tumor, rectum - INVASIVE ADENOCARCINOMA, MODERATELY DIFFERENTIATED - TUMOR INVADES THROUGH SEROSA, SEE COMMENT. - PERFORATION WITH VISIBLE STENT. - LYMPHOVASCULAR AND PERINEURAL INVASION IS PRESENT. - DISTAL AND PROXIMAL RESECTION MARGINS ARE NEGATIVE, SEE COMMENT FOR RADIAL MARGIN. - METASTATIC CARCINOMA IN FOUR OF FOURTEEN LYMPH NODES (4/14). - SEROSAL METASTATIC NODULES AND SATELLITE NODULES PRESENT. - SEE ONCOLOGY TABLE AND COMMENT.    ASSESSMENT AND PLAN:  Stage IV CRC Cachexia Pressure wound -- well healing Anemia  She will proceed with her next cycle of FOLFOX/Avastin today. She refuses neulasta. Her pressure wound is healed although skin remains erythematous. She is still very thin and has not had improvement in her weight although she is adamant she eats very well.  Her anemia is chronic we will continue to monitor this. I have encouraged ongoing improvement in her nutritional status.  She has significant problems with nausea after treatment.She states that she cannot take  Ativan because it makes her too sleepy so she will not take that. She is not willing to try Zyprexa. She does not like zofran  I have dose reduced her oxaliplatin by 20%  and it seems to have eliminated vomiting. She has however not any weight gain.  I described to the patient and her husband in detail but I would rather try to work on her current regimen which is working for her, to make her side effects tolerable so that we can continue with  our current drugs. I explained to the patient we do not have a lot of medications available for therapy in that utilizing each regimen to its fullest capacity is in her best interest.  She will return in 2 weeks with repeat labs, PE and her next cycle of chemotherapy.   We will schedule re-staging after her next cycle.   All questions were answered. The patient knows to call the clinic with any problems, questions or concerns. We can certainly see the patient much sooner if necessary.   This document serves as a record of services personally performed by Ancil Linsey, MD. It was created on her behalf by Janace Hoard a trained medical scribe. The creation of this record is based on the scribe's personal observations and the provider's statements to them. This document has been checked and approved by the attending provider.  I have reviewed the above documentation for accuracy and completeness, and I agree with the above.  This note was electronically signed.  Kelby Fam. [name], MD

## 2015-01-04 LAB — CEA: CEA: 20.2 ng/mL — AB (ref 0.0–4.7)

## 2015-01-05 ENCOUNTER — Encounter (HOSPITAL_BASED_OUTPATIENT_CLINIC_OR_DEPARTMENT_OTHER): Payer: Commercial Managed Care - HMO

## 2015-01-05 ENCOUNTER — Encounter (HOSPITAL_COMMUNITY): Payer: Self-pay

## 2015-01-05 VITALS — BP 95/54 | HR 60 | Resp 18

## 2015-01-05 DIAGNOSIS — E860 Dehydration: Secondary | ICD-10-CM

## 2015-01-05 DIAGNOSIS — C787 Secondary malignant neoplasm of liver and intrahepatic bile duct: Secondary | ICD-10-CM | POA: Diagnosis not present

## 2015-01-05 DIAGNOSIS — C19 Malignant neoplasm of rectosigmoid junction: Secondary | ICD-10-CM | POA: Diagnosis not present

## 2015-01-05 DIAGNOSIS — Z452 Encounter for adjustment and management of vascular access device: Secondary | ICD-10-CM | POA: Diagnosis not present

## 2015-01-05 DIAGNOSIS — C189 Malignant neoplasm of colon, unspecified: Secondary | ICD-10-CM

## 2015-01-05 MED ORDER — SODIUM CHLORIDE 0.9 % IJ SOLN
10.0000 mL | INTRAMUSCULAR | Status: DC | PRN
  Administered 2015-01-05: 10 mL
  Filled 2015-01-05: qty 10

## 2015-01-05 MED ORDER — HEPARIN SOD (PORK) LOCK FLUSH 100 UNIT/ML IV SOLN
INTRAVENOUS | Status: AC
  Filled 2015-01-05: qty 5

## 2015-01-05 MED ORDER — HEPARIN SOD (PORK) LOCK FLUSH 100 UNIT/ML IV SOLN
500.0000 [IU] | Freq: Once | INTRAVENOUS | Status: AC | PRN
  Administered 2015-01-05: 500 [IU]

## 2015-01-05 NOTE — Patient Instructions (Signed)
Port flushed Pump removed  Follow up as scheduled Please call the clinic if you have any questions or concerns

## 2015-01-05 NOTE — Progress Notes (Signed)
Sanjuan Dame presented for Portacath de-access and flush.  Proper placement of portacath confirmed by CXR.  Portacath located left chest wall accessed with  H 20 needle.  Good blood return present. Portacath flushed with 38ml NS and 500U/71ml Heparin and needle removed intact.  Procedure tolerated well and without incident.  Pump removed

## 2015-01-17 ENCOUNTER — Encounter (HOSPITAL_BASED_OUTPATIENT_CLINIC_OR_DEPARTMENT_OTHER): Payer: Commercial Managed Care - HMO | Admitting: Hematology & Oncology

## 2015-01-17 ENCOUNTER — Encounter (HOSPITAL_BASED_OUTPATIENT_CLINIC_OR_DEPARTMENT_OTHER): Payer: Commercial Managed Care - HMO

## 2015-01-17 ENCOUNTER — Other Ambulatory Visit (HOSPITAL_COMMUNITY): Payer: Self-pay

## 2015-01-17 ENCOUNTER — Encounter (HOSPITAL_COMMUNITY): Payer: Self-pay | Admitting: Hematology & Oncology

## 2015-01-17 VITALS — BP 127/67 | HR 57 | Temp 97.7°F | Resp 16 | Wt 98.4 lb

## 2015-01-17 DIAGNOSIS — E43 Unspecified severe protein-calorie malnutrition: Secondary | ICD-10-CM

## 2015-01-17 DIAGNOSIS — D638 Anemia in other chronic diseases classified elsewhere: Secondary | ICD-10-CM | POA: Diagnosis not present

## 2015-01-17 DIAGNOSIS — G620 Drug-induced polyneuropathy: Secondary | ICD-10-CM | POA: Diagnosis not present

## 2015-01-17 DIAGNOSIS — C786 Secondary malignant neoplasm of retroperitoneum and peritoneum: Secondary | ICD-10-CM

## 2015-01-17 DIAGNOSIS — C189 Malignant neoplasm of colon, unspecified: Secondary | ICD-10-CM

## 2015-01-17 DIAGNOSIS — Z5112 Encounter for antineoplastic immunotherapy: Secondary | ICD-10-CM | POA: Diagnosis not present

## 2015-01-17 DIAGNOSIS — C19 Malignant neoplasm of rectosigmoid junction: Secondary | ICD-10-CM

## 2015-01-17 DIAGNOSIS — C787 Secondary malignant neoplasm of liver and intrahepatic bile duct: Secondary | ICD-10-CM

## 2015-01-17 DIAGNOSIS — Z5111 Encounter for antineoplastic chemotherapy: Secondary | ICD-10-CM

## 2015-01-17 DIAGNOSIS — T451X5A Adverse effect of antineoplastic and immunosuppressive drugs, initial encounter: Secondary | ICD-10-CM

## 2015-01-17 LAB — CBC WITH DIFFERENTIAL/PLATELET
BASOS ABS: 0 10*3/uL (ref 0.0–0.1)
BASOS PCT: 0 %
EOS ABS: 0.2 10*3/uL (ref 0.0–0.7)
EOS PCT: 2 %
HEMATOCRIT: 32.4 % — AB (ref 36.0–46.0)
Hemoglobin: 10.3 g/dL — ABNORMAL LOW (ref 12.0–15.0)
Lymphocytes Relative: 29 %
Lymphs Abs: 2.2 10*3/uL (ref 0.7–4.0)
MCH: 28.7 pg (ref 26.0–34.0)
MCHC: 31.8 g/dL (ref 30.0–36.0)
MCV: 90.3 fL (ref 78.0–100.0)
MONO ABS: 1 10*3/uL (ref 0.1–1.0)
MONOS PCT: 14 %
NEUTROS ABS: 4 10*3/uL (ref 1.7–7.7)
Neutrophils Relative %: 54 %
PLATELETS: 158 10*3/uL (ref 150–400)
RBC: 3.59 MIL/uL — ABNORMAL LOW (ref 3.87–5.11)
RDW: 21.3 % — AB (ref 11.5–15.5)
WBC: 7.4 10*3/uL (ref 4.0–10.5)

## 2015-01-17 LAB — COMPREHENSIVE METABOLIC PANEL
ALBUMIN: 3.7 g/dL (ref 3.5–5.0)
ALT: 40 U/L (ref 14–54)
ANION GAP: 7 (ref 5–15)
AST: 40 U/L (ref 15–41)
Alkaline Phosphatase: 183 U/L — ABNORMAL HIGH (ref 38–126)
BILIRUBIN TOTAL: 0.2 mg/dL — AB (ref 0.3–1.2)
BUN: 18 mg/dL (ref 6–20)
CHLORIDE: 102 mmol/L (ref 101–111)
CO2: 27 mmol/L (ref 22–32)
Calcium: 9.2 mg/dL (ref 8.9–10.3)
Creatinine, Ser: 0.54 mg/dL (ref 0.44–1.00)
GFR calc Af Amer: >60 mL/min (ref >60)
GLUCOSE: 71 mg/dL (ref 65–99)
POTASSIUM: 4.3 mmol/L (ref 3.5–5.1)
Sodium: 136 mmol/L (ref 135–145)
TOTAL PROTEIN: 7.3 g/dL (ref 6.5–8.1)

## 2015-01-17 LAB — URINALYSIS, DIPSTICK ONLY
Bilirubin Urine: NEGATIVE
GLUCOSE, UA: NEGATIVE mg/dL
HGB URINE DIPSTICK: NEGATIVE
Ketones, ur: NEGATIVE mg/dL
Nitrite: NEGATIVE
PROTEIN: NEGATIVE mg/dL
Specific Gravity, Urine: 1.015 (ref 1.005–1.030)
Urobilinogen, UA: 0.2 mg/dL (ref 0.0–1.0)
pH: 5.5 (ref 5.0–8.0)

## 2015-01-17 MED ORDER — LEUCOVORIN CALCIUM INJECTION 350 MG
400.0000 mg/m2 | Freq: Once | INTRAVENOUS | Status: AC
  Administered 2015-01-17: 584 mg via INTRAVENOUS
  Filled 2015-01-17: qty 29.2

## 2015-01-17 MED ORDER — SODIUM CHLORIDE 0.9 % IV SOLN
INTRAVENOUS | Status: DC
  Administered 2015-01-17: 14:00:00 via INTRAVENOUS

## 2015-01-17 MED ORDER — SODIUM CHLORIDE 0.9 % IV SOLN
5.0000 mg/kg | Freq: Once | INTRAVENOUS | Status: AC
  Administered 2015-01-17: 225 mg via INTRAVENOUS
  Filled 2015-01-17: qty 9

## 2015-01-17 MED ORDER — SODIUM CHLORIDE 0.9 % IV SOLN
Freq: Once | INTRAVENOUS | Status: AC
  Administered 2015-01-17: 11:00:00 via INTRAVENOUS
  Filled 2015-01-17: qty 5

## 2015-01-17 MED ORDER — DEXTROSE 5 % IV SOLN
Freq: Once | INTRAVENOUS | Status: AC
  Administered 2015-01-17: 10:00:00 via INTRAVENOUS

## 2015-01-17 MED ORDER — SODIUM CHLORIDE 0.9 % IJ SOLN: 10.0000 mL | INTRAMUSCULAR | Status: DC | PRN

## 2015-01-17 MED ORDER — PALONOSETRON HCL INJECTION 0.25 MG/5ML
INTRAVENOUS | Status: AC
  Filled 2015-01-17: qty 5

## 2015-01-17 MED ORDER — PALONOSETRON HCL INJECTION 0.25 MG/5ML
0.2500 mg | Freq: Once | INTRAVENOUS | Status: AC
  Administered 2015-01-17: 0.25 mg via INTRAVENOUS

## 2015-01-17 MED ORDER — OXALIPLATIN CHEMO INJECTION 100 MG/20ML
68.0000 mg/m2 | Freq: Once | INTRAVENOUS | Status: AC
  Administered 2015-01-17: 100 mg via INTRAVENOUS
  Filled 2015-01-17: qty 20

## 2015-01-17 MED ORDER — FLUOROURACIL CHEMO INJECTION 2.5 GM/50ML
400.0000 mg/m2 | Freq: Once | INTRAVENOUS | Status: AC
  Administered 2015-01-17: 600 mg via INTRAVENOUS
  Filled 2015-01-17: qty 12

## 2015-01-17 MED ORDER — SODIUM CHLORIDE 0.9 % IV SOLN
2400.0000 mg/m2 | INTRAVENOUS | Status: DC
  Administered 2015-01-17: 3500 mg via INTRAVENOUS
  Filled 2015-01-17: qty 70

## 2015-01-17 NOTE — Progress Notes (Signed)
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 Diffuse liver metastases   07/02/2014 Imaging Changes consistent with the known history of colon carcinoma with metastatic disease involving the liver and peritoneum. Recent stent placement within the colonic lesion. Some fecal material is noted within the proximal portion of the stent which a...   07/05/2014 Pathology Results Liver, needle/core biopsy, left lobe - METASTATIC ADENOCARCINOMA   07/19/2014 - 08/16/2014 Chemotherapy FOLFOX   07/23/2014 Imaging Similar findings of advanced stage IV metastatic colon cancer with perhaps slight interval progression of disease in the liver.   08/25/2014 Treatment Plan Change Chemotherapy held due to hospitalization   08/25/2014 - 09/21/2014 Hospital Admission SBO   08/26/2014 Imaging High-grade distal small bowel obstruction, likely from peritoneal metastatic disease.  Extensive hepatic metastatic disease. The largest metastasis in the left lobe has decreased from 07/23/2014.   08/31/2014 Surgery Colon, segmental resection for tumor, rectum - INVASIVE ADENOCARCINOMA, MODERATELY DIFFERENTIATED - TUMOR INVADES THROUGH SEROSA, SEE COMMENT. - PERFORATION WITH VISIBLE STENT. - LYMPHOVASCULAR AND PERINEURAL INVASION IS PRESENT. - DISTAL AND PROX...   09/15/2014 Imaging No bowel obstruction suspected status post multifocal small and large bowel resection with both primary reanastomoses and descending colostomy. Patulous appearance of colon in the right abdomen might reflect focal ileus.   11/06/2014 -  Chemotherapy FOLFOX   11/28/2014 - 11/30/2014 Hospital Admission 1.   Nausea and vomiting 2.     Constipation     CURRENT THERAPY: FOLFOX/AVASTIN  INTERVAL HISTORY: Sydney Morrison 59 y.o. 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 Michael Boston on 08/31/2014 when she was admitted with SBO.   Sydney Morrison is here today with her husband. She is in the bed receiving treatment. She says she's feeling good; eating, sleeping, smiling.  She remarks "[my husband] doesn't consider it fun, but I've felt good enough to start cleaning the way I want to clean." She states that this has made her very happy and feel good.  Sydney Morrison confirms that they've been out doing things, to the [employer], went to [employer], stocked up on books, etc.  She does comment that her fingers are not doing well. She also indicates her right foot, and says she stubbed her toe. When she removes her sock, her right pinky toe is visibly very bruised and purple; but she remarks that it doesn't hurt. She states that she didn't even know she did anything to it until she took a shower. She comments that she had a little discomfort in the toe yesterday, but when she walks and everything she feels no pain.  She says this is cycle 8 of her treatment, in terms of her total cumulative cycles. She has had two treatments of Avastin so far.  She states that last time she received treatment, she felt like she had more side-effects, like it was a "full dose" of oxaliplatin, but was advised that her symptoms could just be due to toxicity being cumulative. She complains especially of her neuropathy and jaw pain. She  says her chemo weeks are still "crap," but remarks that the week off is much better.  She confirms being able to write, but states that her handwriting looks totally different than it did before. She confirms being able to  turn the pages in her book, and states that she can button her shirts, without significant difficulty.  She states that her port-a-cath is hurting a little bit, "kinda like an inside thing." She says it's not on the outside at all.  She also confirms that her colostomy bag is fine. She says that her bowels are usually impossible to find a "happy medium,' but this week has been a happy medium, "because she's felt so good this week." She says she feels like things have been flowing fine and a good consistency this past week, but that typically this is a difficult balance to achieve. Her husband comments that she's also getting used to dealing with the colostomy, which could be helping.   Past Medical History  Diagnosis Date  . Crohn's disease ([place])   . Chronic headache   . Cancer ([place])   . Nausea and vomiting 08/25/2014  . 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 [id number]; Chronic blood loss anemia; Antineoplastic chemotherapy induced anemia; Dehydration; Chemotherapy-induced neuropathy ([place]); Nausea and vomiting; SBO (small bowel obstruction) ([place]); Atrial flutter ([place]); Goals of care, counseling/discussion; Screen for STD (sexually transmitted disease); Pressure ulcer, stage 1; Postoperative fever; Leucocytosis; Protein-calorie malnutrition, severe ([place]); Tobacco abuse; Encounter for palliative care; Hyponatremia; Intolerance, food ([place]); Intractable nausea and vomiting; Constipation; Anemia of chronic disease; and Thrombocytopenia ([place]) on her problem list.     is allergic to avelox; codeine; dextromethorphan; erythromycin; penicillins; percocet; reglan; zofran; and suprep.  Current Outpatient Prescriptions on File Prior to Visit  Medication Sig Dispense Refill  . acetaminophen (TYLENOL) 325 MG tablet Take 325 mg by mouth every 6 (six) hours as needed for fever or headache (headahce).     . ALPRAZolam (XANAX) 0.5 MG tablet Take 0.5 mg by mouth at bedtime as needed for  sleep.     . Bevacizumab (AVASTIN IV) Inject into the vein every 14 (fourteen) days.    . cromolyn (NASALCROM) 5.2 MG/ACT nasal spray Place 1 spray into both nostrils at bedtime.    . Cyanocobalamin (VITAMIN B-12 CR) 1500 MCG TBCR Take 1 tablet by mouth daily.    Marland Kitchen dexamethasone (DECADRON) 4 MG tablet Take 1 tablet (4 mg total) by mouth 2 (two) times daily. For 2 days. Begin day of pump disconnect. 8 tablet 1  . dextrose 5 % SOLN 1,000 mL with fluorouracil 5 GM/100ML SOLN Inject into the vein every 14 (fourteen) days.    . ENSURE (ENSURE) Take 237 mLs by mouth 2 (two) times daily with a meal.    . Fluorouracil (ADRUCIL IV) Inject into the vein every 14 (fourteen) days.    . GENERLAC 10 GM/15ML SOLN     . LEUCOVORIN CALCIUM IV Inject into the vein. Every 14 days    . leucovorin in dextrose 5 % 250 mL Inject into the vein every 14 (fourteen) days.    Marland Kitchen lidocaine-prilocaine (EMLA) cream Apply 1 application topically as needed. Apply to portacath site 1-2 hours prior to use 30 g 3  . Multiple Vitamins-Minerals (MULTIVITAMIN WITH MINERALS) tablet Take 1 tablet by mouth daily.    . nicotine (NICODERM CQ - DOSED IN MG/24 HR) 7 mg/24hr patch Place 7 mg onto the skin daily.    Marland Kitchen  oxaliplatin in dextrose 5 % 500 mL Inject into the vein every 14 (fourteen) days.    . OXALIPLATIN IV Inject into the vein. Every 14 days    . Potassium Bicarb-Citric Acid 20 MEQ TBEF Take 1 tablet (20 mEq total) by mouth daily. Dissolve in ginger ale 30 each 1  . chlorpheniramine (CHLOR-TRIMETON) 4 MG tablet Take 4 mg by mouth as needed for allergies. When takes only takes 1/2 tablet    . fluconazole (DIFLUCAN) 150 MG tablet     . LORazepam (ATIVAN) 0.5 MG tablet Take 1 tablet (0.5 mg total) by mouth every 6 (six) hours as needed for anxiety. (Patient not taking: Reported on 12/20/2014) 30 tablet 0  . traMADol (ULTRAM) 50 MG tablet Take 50 mg by mouth every 6 (six) hours as needed for moderate pain.      No current  facility-administered medications on file prior to visit.    Past Surgical History  Procedure Laterality Date  . Breast surgery    . Uterine fibroid surgery    . Colonoscopy w/ biopsies  06/29/2014    DR HUNG  . Flexible sigmoidoscopy N/A 06/30/2014    Procedure: FLEXIBLE SIGMOIDOSCOPY;  Surgeon: Carol Ada, MD;  Location: [facility] ENDOSCOPY;  Service: Endoscopy;  Laterality: N/A;  . Colonic stent placement N/A 06/30/2014    Procedure: COLONIC STENT PLACEMENT;  Surgeon: Carol Ada, MD;  Location: [facility] ENDOSCOPY;  Service: Endoscopy;  Laterality: N/A;  with fluro   . Portacath placement N/A 07/04/2014    Procedure: POWER PORT PLACEMENT;  Surgeon: Alphonsa Overall, MD;  Location: [facility];  Service: General;  Laterality: N/A;  . Laparotomy N/A 08/31/2014    Procedure: EXPLORATORY LAPAROTOMY;  Surgeon: Michael Boston, MD;  Location: [facility];  Service: General;  Laterality: N/A;  . Bowel resection N/A 08/31/2014    Procedure: SMALL BOWEL RESECTION;  Surgeon: Michael Boston, MD;  Location: [facility];  Service: General;  Laterality: N/A;  . Colostomy N/A 08/31/2014    Procedure: low anterior resection with COLOSTOMY;  Surgeon: Michael Boston, MD;  Location: [facility];  Service: General;  Laterality: N/A;  . Appendectomy  08/31/2014    Procedure: APPENDECTOMY;  Surgeon: Michael Boston, MD;  Location: [facility];  Service: General;;  . Supracervical abdominal hysterectomy  08/31/2014    Procedure: HYSTERECTOMY SUPRACERVICAL ABDOMINAL BILATERAL TUBES AND OVARIES;  Surgeon: Michael Boston, MD;  Location: [facility];  Service: General;;  . Transverse colon resection  08/31/2014    Procedure: TRANSVERSE COLON RESECTION;  Surgeon: Michael Boston, MD;  Location: [facility];  Service: General;;  . Debulking  08/31/2014    Procedure: DEBULKING OF PERITONEUM;  Surgeon: Michael Boston, MD;  Location: [facility];  Service: General;;    Denies any headaches, dizziness, double vision, fevers, chills, night sweats, nausea, vomiting, diarrhea, constipation, chest pain,  heart palpitations, shortness of breath, blood in stool, black tarry stool, urinary pain, urinary burning, urinary frequency, hematuria. Positive for nausea,  and malaise/fatigue.  14 point review of systems was performed and is negative except as detailed under history of present illness and above     PHYSICAL EXAMINATION ECOG PERFORMANCE STATUS: 2 - Symptomatic, <50% confined to bed  There were no vitals filed for this visit.  There were no vitals filed for this visit.  GENERAL:alert, no distress, cachectic, cooperative, smiling and accompanied by her husband, Jenny Reichmann. SKIN: skin color, texture, turgor are normal HEAD: Normocephalic, No masses, lesions, tenderness or abnormalities EYES: normal, PERRLA, EOMI, Conjunctiva are pink and non-injected  EARS: External ears normal OROPHARYNX:lips, buccal mucosa, and tongue normal and mucous membranes are moist  NECK: supple, trachea midline LYMPH:  not examined BREAST:not examined LUNGS: clear to auscultation and percussion HEART: regular rate & rhythm, no murmurs, no gallops, S1 normal and S2 normal ABDOMEN:abdomen soft, non-tender, normal bowel sounds and ostomy noted.  Small stomal hernia BACK: Back symmetric, no curvature, sacral area with bandage, erythema noted EXTREMITIES:less then 2 second capillary refill, no joint deformities, effusion, or inflammation, no skin discoloration, no cyanosis  NEURO: alert & oriented x 3 with fluent speech, no focal motor/sensory deficits   LABORATORY DATA: I have reviewed the data below as listed.  CBC    Component Value Date/Time   WBC 7.4 01/17/2015 0920   WBC 5.5 08/16/2014 1037   RBC 3.59* 01/17/2015 0920   RBC 2.71* 09/12/2014 0515   RBC 3.61* 08/16/2014 1037   HGB 10.3* 01/17/2015 0920   HGB 9.6* 08/16/2014 1037   HCT 32.4* 01/17/2015 0920   HCT 30.9* 08/16/2014 1037   PLT 158 01/17/2015 0920   PLT 167 08/16/2014 1037   MCV 90.3 01/17/2015 0920   MCV 85.6 08/16/2014 1037   MCH 28.7  01/17/2015 0920   MCH 26.6 08/16/2014 1037   MCHC 31.8 01/17/2015 0920   MCHC 31.1* 08/16/2014 1037   RDW 21.3* 01/17/2015 0920   RDW 16.1* 08/16/2014 1037   LYMPHSABS 2.2 01/17/2015 0920   LYMPHSABS 1.2 08/16/2014 1037   MONOABS 1.0 01/17/2015 0920   MONOABS 0.9 08/16/2014 1037   EOSABS 0.2 01/17/2015 0920   EOSABS 0.2 08/16/2014 1037   BASOSABS 0.0 01/17/2015 0920   BASOSABS 0.0 08/16/2014 1037      Chemistry      Component Value Date/Time   NA 136 01/17/2015 0920   NA 140 08/16/2014 1037   K 4.3 01/17/2015 0920   K 4.2 08/16/2014 1037   CL 102 01/17/2015 0920   CO2 27 01/17/2015 0920   CO2 28 08/16/2014 1037   BUN 18 01/17/2015 0920   BUN 23.1 08/16/2014 1037   CREATININE 0.54 01/17/2015 0920   CREATININE 0.7 08/16/2014 1037      Component Value Date/Time   CALCIUM 9.2 01/17/2015 0920   CALCIUM 9.5 08/16/2014 1037   ALKPHOS 183* 01/17/2015 0920   ALKPHOS 143 08/16/2014 1037   AST 40 01/17/2015 0920   AST 21 08/16/2014 1037   ALT 40 01/17/2015 0920   ALT 15 08/16/2014 1037   BILITOT 0.2* 01/17/2015 0920   BILITOT 0.28 08/16/2014 1037     Lab Results  Component Value Date   CEA 20.2* 01/03/2015    PATHOLOGY:  Diagnosis 1. Soft tissue mass, biopsy, peritoneal mass dome of bladder - METASTATIC ADENOCARCINOMA. 2. Appendix, Incidental - METASTATIC ADENOCARCINOMA. 3. Small intestine, resection, ileum - METASTATIC ADENOCARCINOMA. - RESECTION MARGINS ARE NEGATIVE. 4. Colon, segmental resection, with omentum, transverse - METASTATIC ADENOCARCINOMA INVOLVING COLON AND OMENTUM. - RESECTION MARGINS ARE NEGATIVE. 5. Soft tissue, biopsy, right pelvic sidewall nodule - METASTATIC ADENOCARCINOMA. 6. Soft tissue mass, biopsy, anterior peritoneum mass near dome - METASTATIC ADENOCARCINOMA. 7. Colon, segmental resection for tumor, rectum - INVASIVE ADENOCARCINOMA, MODERATELY DIFFERENTIATED - TUMOR INVADES THROUGH SEROSA, SEE COMMENT. - PERFORATION WITH VISIBLE  STENT. - LYMPHOVASCULAR AND PERINEURAL INVASION IS PRESENT. - DISTAL AND PROXIMAL RESECTION MARGINS ARE NEGATIVE, SEE COMMENT FOR RADIAL MARGIN. - METASTATIC CARCINOMA IN FOUR OF FOURTEEN LYMPH NODES (4/14). - SEROSAL METASTATIC NODULES AND SATELLITE NODULES PRESENT. - SEE ONCOLOGY TABLE AND COMMENT.  ASSESSMENT AND PLAN:  Stage IV CRC Cachexia Pressure wound -- well healing Anemia  She will proceed with her next cycle of FOLFOX/Avastin today. She refuses neulasta. Her pressure wound is healed although skin remains erythematous. Weight is up a few pounds. Mood is much better today  Her anemia is chronic we will continue to monitor this. I have encouraged ongoing improvement in her nutritional status.  Nausea and vomiting are much improved since reducing her oxaliplatin.  I suspect based on her neuropathy that we will be heading towards the end of FOLFOX therapy. She is due for repeat imaging next week. We will regroup after imaging and decide how to proceed based upon her scan results and neuropathy.  She states that she doesn't need any refills today.  All questions were answered. The patient knows to call the clinic with any problems, questions or concerns. We can certainly see the patient much sooner if necessary.   This document serves as a record of services personally performed by Ancil Linsey, MD. It was created on her behalf by Toni Amend, a trained medical scribe. The creation of this record is based on the scribe's personal observations and the provider's statements to them. This document has been checked and approved by the attending provider.  I have reviewed the above documentation for accuracy and completeness, and I agree with the above.  This note was electronically signed.  Kelby Fam. [name], MD

## 2015-01-17 NOTE — Patient Instructions (Signed)
[place] at [facility] Discharge Instructions  RECOMMENDATIONS MADE BY THE CONSULTANT AND ANY TEST RESULTS WILL BE SENT TO YOUR REFERRING PHYSICIAN.  Exam and discussion by Dr. [name] Report fevers, uncontrolled nausea, vomiting, increase in bowel movements or other concerns. CT scans of chest, abdomen and pelvis as scheduled.  Follow-up after scans.  Thank you for choosing [place] at [facility] to provide your oncology and hematology care.  To afford each patient quality time with our provider, please arrive at least 15 minutes before your scheduled appointment time.    You need to re-schedule your appointment should you arrive 10 or more minutes late.  We strive to give you quality time with our providers, and arriving late affects you and other patients whose appointments are after yours.  Also, if you no show three or more times for appointments you may be dismissed from the clinic at the providers discretion.     Again, thank you for choosing [facility].  Our hope is that these requests will decrease the amount of time that you wait before being seen by our physicians.       _____________________________________________________________  Should you have questions after your visit to [facility], please contact our office at [phone] between the hours of 8:30 a.m. and 4:30 p.m.  Voicemails left after 4:30 p.m. will not be returned until the following business day.  For prescription refill requests, have your pharmacy contact our office.

## 2015-01-17 NOTE — Patient Instructions (Signed)
[facility] Discharge Instructions for Patients Receiving Chemotherapy  Today you received the following chemotherapy agents: oxaliplatin, leucovorin, avastin, and fluorouracil.      If you develop nausea and vomiting, or diarrhea that is not controlled by your medication, call the clinic.  The clinic phone number is [phone]. Office hours are Monday-Friday 8:30am-5:00pm.  BELOW ARE SYMPTOMS THAT SHOULD BE REPORTED IMMEDIATELY:  *FEVER GREATER THAN 101.0 F  *CHILLS WITH OR WITHOUT FEVER  NAUSEA AND VOMITING THAT IS NOT CONTROLLED WITH YOUR NAUSEA MEDICATION  *UNUSUAL SHORTNESS OF BREATH  *UNUSUAL BRUISING OR BLEEDING  TENDERNESS IN MOUTH AND THROAT WITH OR WITHOUT PRESENCE OF ULCERS  *URINARY PROBLEMS  *BOWEL PROBLEMS  UNUSUAL RASH Items with * indicate a potential emergency and should be followed up as soon as possible. If you have an emergency after office hours please contact your primary care physician or go to the nearest emergency department.  Please call the clinic during office hours if you have any questions or concerns.   You may also contact the Patient Navigator at [phone] should you have any questions or need assistance in obtaining follow up care. _____________________________________________________________________ Have you asked about our STAR program?    STAR stands for Survivorship Training and Rehabilitation, and this is a nationally recognized cancer care program that focuses on survivorship and rehabilitation.  Cancer and cancer treatments may cause problems, such as, pain, making you feel tired and keeping you from doing the things that you need or want to do. Cancer rehabilitation can help. Our goal is to reduce these troubling effects and help you have the best quality of life possible.  You may receive a survey from a nurse that asks questions about your current state of health.  Based on the survey results, all eligible  patients will be referred to the [facility] program for an evaluation so we can better serve you! A frequently asked questions sheet is available upon request.

## 2015-01-18 ENCOUNTER — Encounter (HOSPITAL_COMMUNITY): Payer: Self-pay | Admitting: *Deleted

## 2015-01-18 ENCOUNTER — Inpatient Hospital Stay (HOSPITAL_COMMUNITY)
Admission: EM | Admit: 2015-01-18 | Discharge: 2015-01-22 | DRG: 392 | Disposition: A | Discharge status: Home / Self Care | Payer: Commercial Managed Care - HMO | Attending: Internal Medicine | Admitting: Internal Medicine

## 2015-01-18 ENCOUNTER — Telehealth (HOSPITAL_COMMUNITY): Payer: Self-pay | Admitting: *Deleted

## 2015-01-18 DIAGNOSIS — K5900 Constipation, unspecified: Secondary | ICD-10-CM | POA: Diagnosis present

## 2015-01-18 DIAGNOSIS — R1110 Vomiting, unspecified: Secondary | ICD-10-CM

## 2015-01-18 DIAGNOSIS — Z809 Family history of malignant neoplasm, unspecified: Secondary | ICD-10-CM | POA: Diagnosis not present

## 2015-01-18 DIAGNOSIS — C19 Malignant neoplasm of rectosigmoid junction: Secondary | ICD-10-CM | POA: Diagnosis present

## 2015-01-18 DIAGNOSIS — N390 Urinary tract infection, site not specified: Secondary | ICD-10-CM | POA: Diagnosis present

## 2015-01-18 DIAGNOSIS — R112 Nausea with vomiting, unspecified: Secondary | ICD-10-CM | POA: Diagnosis not present

## 2015-01-18 DIAGNOSIS — R52 Pain, unspecified: Secondary | ICD-10-CM

## 2015-01-18 DIAGNOSIS — G43A1 Cyclical vomiting, intractable: Secondary | ICD-10-CM | POA: Diagnosis not present

## 2015-01-18 DIAGNOSIS — G43A0 Cyclical vomiting, not intractable: Secondary | ICD-10-CM | POA: Diagnosis not present

## 2015-01-18 DIAGNOSIS — T451X5A Adverse effect of antineoplastic and immunosuppressive drugs, initial encounter: Secondary | ICD-10-CM | POA: Diagnosis present

## 2015-01-18 DIAGNOSIS — Z8249 Family history of ischemic heart disease and other diseases of the circulatory system: Secondary | ICD-10-CM | POA: Diagnosis not present

## 2015-01-18 DIAGNOSIS — Z933 Colostomy status: Secondary | ICD-10-CM | POA: Diagnosis not present

## 2015-01-18 DIAGNOSIS — Z87891 Personal history of nicotine dependence: Secondary | ICD-10-CM | POA: Diagnosis not present

## 2015-01-18 DIAGNOSIS — C787 Secondary malignant neoplasm of liver and intrahepatic bile duct: Secondary | ICD-10-CM | POA: Diagnosis present

## 2015-01-18 DIAGNOSIS — C189 Malignant neoplasm of colon, unspecified: Secondary | ICD-10-CM | POA: Diagnosis present

## 2015-01-18 LAB — URINALYSIS, ROUTINE W REFLEX MICROSCOPIC
Bilirubin Urine: NEGATIVE
Glucose, UA: NEGATIVE mg/dL
Hgb urine dipstick: NEGATIVE
Ketones, ur: NEGATIVE mg/dL
NITRITE: NEGATIVE
SPECIFIC GRAVITY, URINE: 1.02 (ref 1.005–1.030)
UROBILINOGEN UA: 0.2 mg/dL (ref 0.0–1.0)
pH: 7 (ref 5.0–8.0)

## 2015-01-18 LAB — CBC WITH DIFFERENTIAL/PLATELET
BASOS ABS: 0 10*3/uL (ref 0.0–0.1)
Basophils Relative: 0 %
EOS PCT: 0 %
Eosinophils Absolute: 0 10*3/uL (ref 0.0–0.7)
HCT: 34.1 % — ABNORMAL LOW (ref 36.0–46.0)
HEMOGLOBIN: 11.1 g/dL — AB (ref 12.0–15.0)
LYMPHS PCT: 6 %
Lymphs Abs: 0.6 10*3/uL — ABNORMAL LOW (ref 0.7–4.0)
MCH: 29.1 pg (ref 26.0–34.0)
MCHC: 32.6 g/dL (ref 30.0–36.0)
MCV: 89.3 fL (ref 78.0–100.0)
Monocytes Absolute: 1.1 10*3/uL — ABNORMAL HIGH (ref 0.1–1.0)
Monocytes Relative: 12 %
NEUTROS ABS: 7.8 10*3/uL — AB (ref 1.7–7.7)
NEUTROS PCT: 82 %
PLATELETS: 171 10*3/uL (ref 150–400)
RBC: 3.82 MIL/uL — AB (ref 3.87–5.11)
RDW: 21.4 % — ABNORMAL HIGH (ref 11.5–15.5)
WBC: 9.5 10*3/uL (ref 4.0–10.5)

## 2015-01-18 LAB — BASIC METABOLIC PANEL
ANION GAP: 9 (ref 5–15)
BUN: 29 mg/dL — AB (ref 6–20)
CO2: 32 mmol/L (ref 22–32)
Calcium: 9.5 mg/dL (ref 8.9–10.3)
Chloride: 98 mmol/L — ABNORMAL LOW (ref 101–111)
Creatinine, Ser: 0.64 mg/dL (ref 0.44–1.00)
Glucose, Bld: 107 mg/dL — ABNORMAL HIGH (ref 65–99)
POTASSIUM: 4 mmol/L (ref 3.5–5.1)
SODIUM: 139 mmol/L (ref 135–145)

## 2015-01-18 LAB — URINE MICROSCOPIC-ADD ON

## 2015-01-18 MED ORDER — DEXTROSE 5 % IV SOLN
1.0000 g | Freq: Once | INTRAVENOUS | Status: AC
  Administered 2015-01-18: 1 g via INTRAVENOUS
  Filled 2015-01-18: qty 10

## 2015-01-18 MED ORDER — PROMETHAZINE HCL 25 MG/ML IJ SOLN
12.5000 mg | Freq: Four times a day (QID) | INTRAMUSCULAR | Status: DC | PRN
  Administered 2015-01-18 – 2015-01-19 (×2): 12.5 mg via INTRAVENOUS
  Filled 2015-01-18 (×2): qty 1

## 2015-01-18 MED ORDER — PROMETHAZINE HCL 25 MG/ML IJ SOLN
12.5000 mg | Freq: Once | INTRAMUSCULAR | Status: AC
  Administered 2015-01-18: 12.5 mg via INTRAVENOUS
  Filled 2015-01-18: qty 1

## 2015-01-18 MED ORDER — FENTANYL CITRATE (PF) 100 MCG/2ML IJ SOLN
50.0000 ug | Freq: Once | INTRAMUSCULAR | Status: DC
  Filled 2015-01-18: qty 2

## 2015-01-18 MED ORDER — LACTULOSE 10 GM/15ML PO SOLN
10.0000 g | Freq: Every day | ORAL | Status: DC | PRN
  Administered 2015-01-22: 10 g via ORAL
  Filled 2015-01-18 (×2): qty 30

## 2015-01-18 MED ORDER — ACETAMINOPHEN 325 MG PO TABS: 325.0000 mg | ORAL_TABLET | Freq: Four times a day (QID) | ORAL | Status: DC | PRN

## 2015-01-18 MED ORDER — LORAZEPAM 2 MG/ML IJ SOLN
0.5000 mg | Freq: Once | INTRAMUSCULAR | Status: AC
  Administered 2015-01-18: 0.5 mg via INTRAVENOUS
  Filled 2015-01-18: qty 1

## 2015-01-18 MED ORDER — DEXTROSE 5 % IV SOLN
1.0000 g | INTRAVENOUS | Status: DC
  Administered 2015-01-19: 1 g via INTRAVENOUS
  Filled 2015-01-18 (×3): qty 10

## 2015-01-18 MED ORDER — ENOXAPARIN SODIUM 40 MG/0.4ML ~~LOC~~ SOLN
40.0000 mg | SUBCUTANEOUS | Status: DC
Start: 2015-01-18 — End: 2015-01-22
  Filled 2015-01-18 (×4): qty 0.4

## 2015-01-18 MED ORDER — SODIUM CHLORIDE 0.9 % IV BOLUS (SEPSIS)
500.0000 mL | Freq: Once | INTRAVENOUS | Status: AC
  Administered 2015-01-18: 500 mL via INTRAVENOUS

## 2015-01-18 MED ORDER — FENTANYL CITRATE (PF) 100 MCG/2ML IJ SOLN
50.0000 ug | Freq: Once | INTRAMUSCULAR | Status: AC
  Administered 2015-01-18: 50 ug via INTRAVENOUS
  Filled 2015-01-18: qty 2

## 2015-01-18 MED ORDER — POTASSIUM BICARB-CITRIC ACID 20 MEQ PO TBEF: 20.0000 meq | EFFERVESCENT_TABLET | Freq: Every day | ORAL | Status: DC

## 2015-01-18 MED ORDER — SODIUM CHLORIDE 0.9 % IV SOLN
INTRAVENOUS | Status: DC
  Administered 2015-01-18 – 2015-01-22 (×7): via INTRAVENOUS

## 2015-01-18 MED ORDER — CROMOLYN SODIUM 5.2 MG/ACT NA AERS
1.0000 | INHALATION_SPRAY | Freq: Every day | NASAL | Status: DC
  Filled 2015-01-18: qty 13

## 2015-01-18 MED ORDER — ALPRAZOLAM 0.25 MG PO TABS
0.1250 mg | ORAL_TABLET | Freq: Every evening | ORAL | Status: DC | PRN
  Filled 2015-01-18 (×2): qty 2

## 2015-01-18 MED ORDER — ADULT MULTIVITAMIN W/MINERALS CH
1.0000 | ORAL_TABLET | Freq: Every day | ORAL | Status: DC
  Filled 2015-01-18 (×2): qty 1

## 2015-01-18 MED ORDER — PROMETHAZINE HCL 12.5 MG PO TABS
12.5000 mg | ORAL_TABLET | Freq: Four times a day (QID) | ORAL | Status: DC | PRN
  Filled 2015-01-18: qty 1

## 2015-01-18 MED ORDER — VITAMIN B-12 ER 1500 MCG PO TBCR: 1.0000 | EXTENDED_RELEASE_TABLET | Freq: Every day | ORAL | Status: DC

## 2015-01-18 MED ORDER — PROMETHAZINE HCL 25 MG/ML IJ SOLN
12.5000 mg | Freq: Once | INTRAMUSCULAR | Status: AC
Start: 2015-01-18 — End: 2015-01-18
  Administered 2015-01-18: 12.5 mg via INTRAVENOUS
  Filled 2015-01-18: qty 1

## 2015-01-18 MED ORDER — ENSURE ENLIVE PO LIQD
237.0000 mL | Freq: Two times a day (BID) | ORAL | Status: DC
  Administered 2015-01-22: 237 mL via ORAL

## 2015-01-18 MED ORDER — NICOTINE 7 MG/24HR TD PT24
7.0000 mg | MEDICATED_PATCH | Freq: Every day | TRANSDERMAL | Status: DC
  Administered 2015-01-19 – 2015-01-22 (×4): 7 mg via TRANSDERMAL
  Filled 2015-01-18 (×6): qty 1

## 2015-01-18 MED ORDER — VITAMIN B-12 1000 MCG PO TABS
1500.0000 ug | ORAL_TABLET | Freq: Every day | ORAL | Status: DC
  Filled 2015-01-18 (×2): qty 2

## 2015-01-18 NOTE — Telephone Encounter (Signed)
Received call from pt c/o abdominal pain (onset last night), nausea, "dry heaves".  Reports nausea medication is not helping.  Also c/o lower back pain - states she has been dx with UTI.  Denies fever, but does c/o sweating and chills.  Dr. [name] notified of pt's symptoms - instructed to tell pt to report to the ED for evaluation; pt informed of this and is agreeable.

## 2015-01-18 NOTE — H&P (Signed)
[facility] History and Physical  [name] OBS:[id number] DOB: 06-Feb-1956 DOA: 01/18/2015  Referring physician: ER PCP: [name], MD   Chief Complaint: Nausea, vomiting and lower abdominal pain.  HPI: Sydney Morrison is a 59 y.o. female  This is a 59 year old lady who has stage IV colon cancer, status post colostomy/surgery and now undergoing chemotherapy, who now presents with heaving yesterday and episodes of vomiting today associated with lower abdominal pain. The pain has been moderate and gradual in onset. There is been no associated fever. There is no hematemesis or rectal bleeding. There is no bloating of her abdomen. Urinalysis is suggestive of UTI. She is now being admitted for further management.   Review of Systems:  Apart from symptoms above, all systems are negative.   Past Medical History  Diagnosis Date  . Crohn's disease ([place])   . Chronic headache   . Cancer ([place])   . Nausea and vomiting 08/25/2014  . Sinus infection    Past Surgical History  Procedure Laterality Date  . Breast surgery    . Uterine fibroid surgery    . Colonoscopy w/ biopsies  06/29/2014    DR HUNG  . Flexible sigmoidoscopy N/A 06/30/2014    Procedure: FLEXIBLE SIGMOIDOSCOPY;  Surgeon: Carol Ada, MD;  Location: [facility] ENDOSCOPY;  Service: Endoscopy;  Laterality: N/A;  . Colonic stent placement N/A 06/30/2014    Procedure: COLONIC STENT PLACEMENT;  Surgeon: Carol Ada, MD;  Location: [facility] ENDOSCOPY;  Service: Endoscopy;  Laterality: N/A;  with fluro   . Portacath placement N/A 07/04/2014    Procedure: POWER PORT PLACEMENT;  Surgeon: Alphonsa Overall, MD;  Location: [place];  Service: General;  Laterality: N/A;  . Laparotomy N/A 08/31/2014    Procedure: EXPLORATORY LAPAROTOMY;  Surgeon: Michael Boston, MD;  Location: [facility];  Service: General;  Laterality: N/A;  . Bowel resection N/A 08/31/2014    Procedure: SMALL BOWEL RESECTION;  Surgeon: Michael Boston, MD;  Location: [facility];  Service: General;   Laterality: N/A;  . Colostomy N/A 08/31/2014    Procedure: low anterior resection with COLOSTOMY;  Surgeon: Michael Boston, MD;  Location: [facility];  Service: General;  Laterality: N/A;  . Appendectomy  08/31/2014    Procedure: APPENDECTOMY;  Surgeon: Michael Boston, MD;  Location: [facility];  Service: General;;  . Supracervical abdominal hysterectomy  08/31/2014    Procedure: HYSTERECTOMY SUPRACERVICAL ABDOMINAL BILATERAL TUBES AND OVARIES;  Surgeon: Michael Boston, MD;  [facility];  Service: General;;  . Transverse colon resection  08/31/2014    Procedure: TRANSVERSE COLON RESECTION;  Surgeon: Michael Boston, MD;  [facility];  Service: General;;  . Debulking  08/31/2014    Procedure: DEBULKING OF PERITONEUM;  Surgeon: Michael Boston, MD;  Location: [facility];  Service: General;;   Social History:  reports that she quit smoking about 6 months ago. Her smoking use included Cigarettes. She has a 20 pack-year smoking history. She has never used smokeless tobacco. She reports that she does not drink alcohol or use illicit drugs.  Allergies  Allergen Reactions  . Avelox [Moxifloxacin Hcl In Nacl] Hives  . Codeine Hives  . Dextromethorphan Hives  . Erythromycin Hives  . Penicillins Hives    No problem with Imipenem  . Percocet [Oxycodone-Acetaminophen] Nausea And Vomiting    Causes immediate vomiting  . Reglan [Metoclopramide]     Heart rate went up  . Zofran [Ondansetron Hcl]     Dry heaves  . Suprep [Na Sulfate-K Sulfate-Mg Sulf] Nausea  And Vomiting    Family History  Problem Relation Age of Onset  . Hypertension Mother   . Cancer Father       Prior to Admission medications   Medication Sig Start Date End Date Taking? Authorizing Provider  acetaminophen (TYLENOL) 325 MG tablet Take 325 mg by mouth every 6 (six) hours as needed for fever or headache (headahce).    Yes Historical Provider, MD  ALPRAZolam Duanne Moron) 0.5 MG tablet Take 0.125-0.5 mg by mouth at bedtime as needed for sleep.  09/17/14  Yes  Historical Provider, MD  Bevacizumab (AVASTIN IV) Inject into the vein every 14 (fourteen) days.   Yes Historical Provider, MD  cromolyn (NASALCROM) 5.2 MG/ACT nasal spray Place 1 spray into both nostrils at bedtime.   Yes Historical Provider, MD  Cyanocobalamin (VITAMIN B-12 CR) 1500 MCG TBCR Take 1 tablet by mouth daily.   Yes Historical Provider, MD  dexamethasone (DECADRON) 4 MG tablet Take 1 tablet (4 mg total) by mouth 2 (two) times daily. For 2 days. Begin day of pump disconnect. 11/06/14  Yes Patrici Ranks, MD  dextrose 5 % SOLN 1,000 mL with fluorouracil 5 GM/100ML SOLN Inject into the vein every 14 (fourteen) days.   Yes Historical Provider, MD  Fluorouracil (ADRUCIL IV) Inject into the vein every 14 (fourteen) days.   Yes Historical Provider, MD  GENERLAC 10 GM/15ML SOLN Take 10 g by mouth daily as needed (for constipation).  11/30/14  Yes Historical Provider, MD  leucovorin in dextrose 5 % 250 mL Inject into the vein every 14 (fourteen) days.   Yes Historical Provider, MD  lidocaine-prilocaine (EMLA) cream Apply 1 application topically as needed. Apply to portacath site 1-2 hours prior to use 07/14/14  Yes Owens Shark, NP  Multiple Vitamins-Minerals (MULTIVITAMIN WITH MINERALS) tablet Take 1 tablet by mouth daily.   Yes Historical Provider, MD  nicotine (NICODERM CQ - DOSED IN MG/24 HR) 7 mg/24hr patch Place 7 mg onto the skin daily.   Yes Historical Provider, MD  oxaliplatin in dextrose 5 % 500 mL Inject into the vein every 14 (fourteen) days.   Yes Historical Provider, MD  OXALIPLATIN IV Inject into the vein. Every 14 days   Yes Historical Provider, MD  Potassium Bicarb-Citric Acid 20 MEQ TBEF Take 1 tablet (20 mEq total) by mouth daily. Dissolve in ginger ale Patient taking differently: Take 20 mEq by mouth daily. Cranberry juice-dissolve 11/20/14  Yes Patrici Ranks, MD  ENSURE (ENSURE) Take 237 mLs by mouth 2 (two) times daily with a meal.    Historical Provider, MD  LEUCOVORIN  CALCIUM IV Inject into the vein. Every 14 days    Historical Provider, MD   Physical Exam: Filed Vitals:   01/18/15 1055 01/18/15 1413 01/18/15 1619 01/18/15 1707  BP: 124/67 139/74 123/61 143/59  Pulse: 70 58 54 79  Temp: 97.7 F (36.5 C)  97.8 F (36.6 C) 98.3 F (36.8 C)  TempSrc: Oral   Oral  Resp: 16 16 16 16   Height: 5\' 7"  (1.702 m)   5\' 7"  (1.702 m)  Weight: 44.634 kg (98 lb 6.4 oz)   44.951 kg (99 lb 1.6 oz)  SpO2: 100% 100% 100% 99%    Wt Readings from Last 3 Encounters:  01/18/15 44.951 kg (99 lb 1.6 oz)  01/17/15 44.634 kg (98 lb 6.4 oz)  01/03/15 43.545 kg (96 lb)    General:  Appears cachectic. She appears somewhat dehydrated. Eyes: PERRL, normal lids, irises & conjunctiva  ENT: grossly normal hearing, lips & tongue Neck: no LAD, masses or thyromegaly Cardiovascular: RRR, no m/r/g. No LE edema. Telemetry: SR, no arrhythmias  Respiratory: CTA bilaterally, no w/r/r. Normal respiratory effort. Abdomen: soft, some tenderness in the lower abdomen but no acute abdomen clinically. Skin: no rash or induration seen on limited exam Musculoskeletal: grossly normal tone BUE/BLE Psychiatric: grossly normal mood and affect, speech fluent and appropriate Neurologic: grossly non-focal.          Labs on Admission:  Basic Metabolic Panel:  Recent Labs Lab 01/17/15 0920 01/18/15 1340  NA 136 139  K 4.3 4.0  CL 102 98*  CO2 27 32  GLUCOSE 71 107*  BUN 18 29*  CREATININE 0.54 0.64  CALCIUM 9.2 9.5   Liver Function Tests:  Recent Labs Lab 01/17/15 0920  AST 40  ALT 40  ALKPHOS 183*  BILITOT 0.2*  PROT 7.3  ALBUMIN 3.7   No results for input(s): LIPASE, AMYLASE in the last 168 hours. No results for input(s): AMMONIA in the last 168 hours. CBC:  Recent Labs Lab 01/17/15 0920 01/18/15 1340  WBC 7.4 9.5  NEUTROABS 4.0 7.8*  HGB 10.3* 11.1*  HCT 32.4* 34.1*  MCV 90.3 89.3  PLT 158 171   Cardiac Enzymes: No results for input(s): CKTOTAL, CKMB,  CKMBINDEX, TROPONINI in the last 168 hours.  BNP (last 3 results) No results for input(s): BNP in the last 8760 hours.  ProBNP (last 3 results) No results for input(s): PROBNP in the last 8760 hours.  CBG: No results for input(s): GLUCAP in the last 168 hours.  Radiological Exams on Admission: No results found.    Assessment/Plan   1. UTI. Urine for culture. Start intravenous Rocephin as she is not able to tolerate oral antibiotics. 2. Nausea and vomiting. IV fluids and antiemetics as necessary. 3. Stage IV rectosigmoid colon cancer. I will ask oncology to review the patient. She was due to have repeat CT scan of the abdomen in about 1 week and potentially she could get it done while she is in the hospital now.  She'll be admitted to the medical floor. Further recommendations will depend on patient's hospital progress.  Code Status: Full code.   DVT Prophylaxis: Lovenox  Family Communication: I discussed the plan with the patient at the bedside.   Disposition Plan: Home when medically stable.  Time spent: 60 minutes.  Doree Albee [facility] Pager [phone].

## 2015-01-18 NOTE — Progress Notes (Signed)
Pt states that she needs something to help her sleep. Paged NP Schorr who place an order for .5 mg of Ativan to be given. Will continue to monitor

## 2015-01-18 NOTE — ED Provider Notes (Signed)
CSN: [id number]     Arrival date & time 01/18/15  1028 History   First MD Initiated Contact with Patient 01/18/15 1236     Chief Complaint  Patient presents with  . Urinary Tract Infection     Patient is a 59 y.o. female presenting with urinary tract infection. The history is provided by the patient.  Urinary Tract Infection Pain quality:  Aching Pain severity:  Moderate Onset quality:  Gradual Timing:  Constant Progression:  Worsening Chronicity:  New Relieved by:  Nothing Worsened by:  Nothing tried Associated symptoms: abdominal pain and vomiting   Associated symptoms: no fever   Pt reports h/o colon CA with colostomy in place, she is currently on chemo, reporting low abd pain and back pain Her oncologist had diagnosed her with UTI and was going to treat as an outpatient but due to nausea and increased pain she was instructed to the ER  Past Medical History  Diagnosis Date  . Crohn's disease ([place])   . Chronic headache   . Cancer ([place])   . Nausea and vomiting 08/25/2014  . Sinus infection    Past Surgical History  Procedure Laterality Date  . Breast surgery    . Uterine fibroid surgery    . Colonoscopy w/ biopsies  06/29/2014    DR HUNG  . Flexible sigmoidoscopy N/A 06/30/2014    Procedure: FLEXIBLE SIGMOIDOSCOPY;  Surgeon: Carol Ada, MD;  Location: [facility] ENDOSCOPY;  Service: Endoscopy;  Laterality: N/A;  . Colonic stent placement N/A 06/30/2014    Procedure: COLONIC STENT PLACEMENT;  Surgeon: Carol Ada, MD;  Location: [facility] ENDOSCOPY;  Service: Endoscopy;  Laterality: N/A;  with fluro   . Portacath placement N/A 07/04/2014    Procedure: POWER PORT PLACEMENT;  Surgeon: Alphonsa Overall, MD;  Location: [place];  Service: General;  Laterality: N/A;  . Laparotomy N/A 08/31/2014    Procedure: EXPLORATORY LAPAROTOMY;  Surgeon: Michael Boston, MD;  Location[facility];  Service: General;  Laterality: N/A;  . Bowel resection N/A 08/31/2014    Procedure: SMALL BOWEL RESECTION;  Surgeon: Michael Boston, MD;  Location[facility];  Service: General;  Laterality: N/A;  . Colostomy N/A 08/31/2014    Procedure: low anterior resection with COLOSTOMY;  Surgeon: Michael Boston, MD;  Location[facility];  Service: General;  Laterality: N/A;  . Appendectomy  08/31/2014    Procedure: APPENDECTOMY;  Surgeon: Michael Boston, MD;  [facility];  Service: General;;  . Supracervical abdominal hysterectomy  08/31/2014    Procedure: HYSTERECTOMY SUPRACERVICAL ABDOMINAL BILATERAL TUBES AND OVARIES;  Surgeon: Michael Boston, MD;  [facility];  Service: General;;  . Transverse colon resection  08/31/2014    Procedure: TRANSVERSE COLON RESECTION;  Surgeon: Michael Boston, MD;  [facility];  Service: General;;  . Debulking  08/31/2014    Procedure: DEBULKING OF PERITONEUM;  Surgeon: Michael Boston, MD;  Location: [facility];  Service: General;;   Family History  Problem Relation Age of Onset  . Hypertension Mother   . Cancer Father    Social History  Substance Use Topics  . Smoking status: Former Smoker -- 0.50 packs/day for 40 years    Types: Cigarettes    Quit date: 06/25/2014  . Smokeless tobacco: Never Used  . Alcohol Use: No   OB History    Gravida Para Term Preterm AB TAB SAB Ectopic Multiple Living   3 2 2  1  1         Review of Systems  Constitutional:  Positive for chills. Negative for fever.  Gastrointestinal: Positive for vomiting and abdominal pain.  All other systems reviewed and are negative.     Allergies  Avelox; Codeine; Dextromethorphan; Erythromycin; Penicillins; Percocet; Reglan; Zofran; and Suprep  Home Medications   Prior to Admission medications   Medication Sig Start Date End Date Taking? Authorizing Provider  acetaminophen (TYLENOL) 325 MG tablet Take 325 mg by mouth every 6 (six) hours as needed for fever or headache (headahce).     Historical Provider, MD  ALPRAZolam Duanne Moron) 0.5 MG tablet Take 0.5 mg by mouth at bedtime as needed for sleep.  09/17/14   Historical Provider, MD   Bevacizumab (AVASTIN IV) Inject into the vein every 14 (fourteen) days.    Historical Provider, MD  chlorpheniramine (CHLOR-TRIMETON) 4 MG tablet Take 4 mg by mouth as needed for allergies. When takes only takes 1/2 tablet    Historical Provider, MD  cromolyn (NASALCROM) 5.2 MG/ACT nasal spray Place 1 spray into both nostrils at bedtime.    Historical Provider, MD  Cyanocobalamin (VITAMIN B-12 CR) 1500 MCG TBCR Take 1 tablet by mouth daily.    Historical Provider, MD  dexamethasone (DECADRON) 4 MG tablet Take 1 tablet (4 mg total) by mouth 2 (two) times daily. For 2 days. Begin day of pump disconnect. 11/06/14   Patrici Ranks, MD  dextrose 5 % SOLN 1,000 mL with fluorouracil 5 GM/100ML SOLN Inject into the vein every 14 (fourteen) days.    Historical Provider, MD  ENSURE (ENSURE) Take 237 mLs by mouth 2 (two) times daily with a meal.    Historical Provider, MD  fluconazole (DIFLUCAN) 150 MG tablet  11/06/14   Historical Provider, MD  Fluorouracil (ADRUCIL IV) Inject into the vein every 14 (fourteen) days.    Historical Provider, MD  [facility] 10 GM/15ML SOLN  11/30/14   Historical Provider, MD  LEUCOVORIN CALCIUM IV Inject into the vein. Every 14 days    Historical Provider, MD  leucovorin in dextrose 5 % 250 mL Inject into the vein every 14 (fourteen) days.    Historical Provider, MD  lidocaine-prilocaine (EMLA) cream Apply 1 application topically as needed. Apply to portacath site 1-2 hours prior to use 07/14/14   Owens Shark, NP  LORazepam (ATIVAN) 0.5 MG tablet Take 1 tablet (0.5 mg total) by mouth every 6 (six) hours as needed for anxiety. Patient not taking: Reported on 12/20/2014 08/21/14   Ladell Pier, MD  Multiple Vitamins-Minerals (MULTIVITAMIN WITH MINERALS) tablet Take 1 tablet by mouth daily.    Historical Provider, MD  nicotine (NICODERM CQ - DOSED IN MG/24 HR) 7 mg/24hr patch Place 7 mg onto the skin daily.    Historical Provider, MD  oxaliplatin in dextrose 5 % 500 mL Inject into  the vein every 14 (fourteen) days.    Historical Provider, MD  OXALIPLATIN IV Inject into the vein. Every 14 days    Historical Provider, MD  Potassium Bicarb-Citric Acid 20 MEQ TBEF Take 1 tablet (20 mEq total) by mouth daily. Dissolve in ginger ale 11/20/14   Patrici Ranks, MD  traMADol (ULTRAM) 50 MG tablet Take 50 mg by mouth every 6 (six) hours as needed for moderate pain.     Historical Provider, MD   BP 124/67 mmHg  Pulse 70  Temp(Src) 97.7 F (36.5 C) (Oral)  Resp 16  Ht 5\' 7"  (1.702 m)  Wt 98 lb 6.4 oz (44.634 kg)  BMI 15.41 kg/m2  SpO2 100% Physical Exam CONSTITUTIONAL:  Well developed/well nourished HEAD: Normocephalic/atraumatic EYES: EOMI/PERRL ENMT: Mucous membranes dry NECK: supple no meningeal signs SPINE/BACK:entire spine nontender CV: S1/S2 noted, no murmurs/rubs/gallops noted LUNGS: Lungs are clear to auscultation bilaterally, no apparent distress ABDOMEN: soft, mild diffuse tenderness, colostomy in place, no rebound or guarding, bowel sounds noted throughout abdomen GU:no cva tenderness NEURO: Pt is awake/alert/appropriate, moves all extremitiesx4.  No facial droop.   EXTREMITIES: pulses normal/equal, full ROM SKIN: warm, color normal PSYCH: no abnormalities of mood noted, alert and oriented to situation  ED Course  Procedures   Medications  fentaNYL (SUBLIMAZE) injection 50 mcg (not administered)  cefTRIAXone (ROCEPHIN) 1 g in dextrose 5 % 50 mL IVPB (not administered)  fentaNYL (SUBLIMAZE) injection 50 mcg (50 mcg Intravenous Given 01/18/15 1321)  promethazine (PHENERGAN) injection 12.5 mg (12.5 mg Intravenous Given 01/18/15 1322)  sodium chloride 0.9 % bolus 500 mL (0 mLs Intravenous Stopped 01/18/15 1408)  promethazine (PHENERGAN) injection 12.5 mg (12.5 mg Intravenous Given 01/18/15 1429)    Labs Review Labs Reviewed  URINALYSIS, ROUTINE W REFLEX MICROSCOPIC (NOT AT [facility]) - Abnormal; Notable for the following:    Protein, ur TRACE (*)     Leukocytes, UA MODERATE (*)    All other components within normal limits  BASIC METABOLIC PANEL - Abnormal; Notable for the following:    Chloride 98 (*)    Glucose, Bld 107 (*)    BUN 29 (*)    All other components within normal limits  CBC WITH DIFFERENTIAL/PLATELET - Abnormal; Notable for the following:    RBC 3.82 (*)    Hemoglobin 11.1 (*)    HCT 34.1 (*)    RDW 21.4 (*)    Neutro Abs 7.8 (*)    Lymphs Abs 0.6 (*)    Monocytes Absolute 1.1 (*)    All other components within normal limits  URINE MICROSCOPIC-ADD ON - Abnormal; Notable for the following:    Squamous Epithelial / LPF FEW (*)    All other components within normal limits  URINE CULTURE   I have personally reviewed and evaluated theselab results as part of my medical decision-making.   3:09 PM D/w dr [name] she is aware of patient and we discussed labs/exam She advised pt to go to ER for increased pain with known UTI and not on ABX as of yet Plan is to load with ABX (pt can take cephalosporins) and if pain improved she can be discharged 3:54 PM Pt has improvement in pain but still with intense nausea and intractable vomiting She reports zofran does not work and she has already had phenergan.   Will admit for IV fluids, IV antibiotics due to intractable vomiting and likely failure of outpatient therapy D/w dr [name], will admit  MDM   Final diagnoses:  UTI (lower urinary tract infection)  Intractable vomiting with nausea, vomiting of unspecified type    Nursing notes including past medical history and social history reviewed and considered in documentation Labs/vital reviewed myself and considered during evaluation     Ripley Fraise, MD 01/18/15 1559

## 2015-01-18 NOTE — ED Notes (Signed)
Pt states she had chemo yesterday. States they drew blood and obtained a urine specimen. States she was called today and was told she had an UTI. Pt also complain of dry heaves

## 2015-01-18 NOTE — ED Notes (Signed)
Patient is taking chemo, has dose yesterday. Was sent here today for UTI, back pain and low pelvic pain.

## 2015-01-18 NOTE — ED Notes (Signed)
Pt request to hold pain medication for now and just give her the phenergan

## 2015-01-19 ENCOUNTER — Ambulatory Visit (HOSPITAL_COMMUNITY): Payer: Commercial Managed Care - HMO

## 2015-01-19 DIAGNOSIS — C787 Secondary malignant neoplasm of liver and intrahepatic bile duct: Secondary | ICD-10-CM

## 2015-01-19 DIAGNOSIS — C189 Malignant neoplasm of colon, unspecified: Secondary | ICD-10-CM

## 2015-01-19 DIAGNOSIS — N390 Urinary tract infection, site not specified: Secondary | ICD-10-CM

## 2015-01-19 DIAGNOSIS — G43A1 Cyclical vomiting, intractable: Secondary | ICD-10-CM

## 2015-01-19 LAB — URINE CULTURE

## 2015-01-19 LAB — CBC
HEMATOCRIT: 33.7 % — AB (ref 36.0–46.0)
HEMOGLOBIN: 10.8 g/dL — AB (ref 12.0–15.0)
MCH: 29.1 pg (ref 26.0–34.0)
MCHC: 32 g/dL (ref 30.0–36.0)
MCV: 90.8 fL (ref 78.0–100.0)
Platelets: 164 10*3/uL (ref 150–400)
RBC: 3.71 MIL/uL — ABNORMAL LOW (ref 3.87–5.11)
RDW: 22.1 % — AB (ref 11.5–15.5)
WBC: 7.5 10*3/uL (ref 4.0–10.5)

## 2015-01-19 LAB — COMPREHENSIVE METABOLIC PANEL
ALBUMIN: 3.9 g/dL (ref 3.5–5.0)
ALT: 59 U/L — ABNORMAL HIGH (ref 14–54)
AST: 55 U/L — AB (ref 15–41)
Alkaline Phosphatase: 191 U/L — ABNORMAL HIGH (ref 38–126)
Anion gap: 10 (ref 5–15)
BUN: 37 mg/dL — AB (ref 6–20)
CHLORIDE: 99 mmol/L — AB (ref 101–111)
CO2: 31 mmol/L (ref 22–32)
Calcium: 9.2 mg/dL (ref 8.9–10.3)
Creatinine, Ser: 0.69 mg/dL (ref 0.44–1.00)
GFR calc Af Amer: >60 mL/min (ref >60)
GLUCOSE: 120 mg/dL — AB (ref 65–99)
POTASSIUM: 4 mmol/L (ref 3.5–5.1)
Sodium: 140 mmol/L (ref 135–145)
Total Bilirubin: 0.7 mg/dL (ref 0.3–1.2)
Total Protein: 7.5 g/dL (ref 6.5–8.1)

## 2015-01-19 MED ORDER — LORAZEPAM 2 MG/ML IJ SOLN
0.5000 mg | Freq: Once | INTRAMUSCULAR | Status: AC
  Administered 2015-01-19: 0.5 mg via INTRAVENOUS
  Filled 2015-01-19: qty 1

## 2015-01-19 MED ORDER — FLUTICASONE PROPIONATE 50 MCG/ACT NA SUSP
1.0000 | Freq: Every day | NASAL | Status: DC
  Filled 2015-01-19: qty 16

## 2015-01-19 MED ORDER — HEPARIN SOD (PORK) LOCK FLUSH 100 UNIT/ML IV SOLN
INTRAVENOUS | Status: AC
Start: 2015-01-19 — End: 2015-01-19
  Filled 2015-01-19: qty 5

## 2015-01-19 MED ORDER — PROMETHAZINE HCL 25 MG/ML IJ SOLN
25.0000 mg | Freq: Four times a day (QID) | INTRAMUSCULAR | Status: DC | PRN
Start: 2015-01-19 — End: 2015-01-22
  Filled 2015-01-19: qty 1

## 2015-01-19 MED ORDER — HEPARIN SOD (PORK) LOCK FLUSH 100 UNIT/ML IV SOLN: 500.0000 [IU] | Freq: Once | INTRAVENOUS | Status: AC | PRN

## 2015-01-19 MED ORDER — DEXAMETHASONE SODIUM PHOSPHATE 4 MG/ML IJ SOLN
2.0000 mg | Freq: Two times a day (BID) | INTRAMUSCULAR | Status: DC
  Administered 2015-01-19 – 2015-01-22 (×7): 2 mg via INTRAVENOUS
  Filled 2015-01-19 (×7): qty 1

## 2015-01-19 MED ORDER — PROCHLORPERAZINE 25 MG RE SUPP
25.0000 mg | Freq: Three times a day (TID) | RECTAL | Status: DC | PRN
  Administered 2015-01-19: 25 mg via RECTAL
  Filled 2015-01-19 (×2): qty 1

## 2015-01-19 MED ORDER — SODIUM CHLORIDE 0.9 % IJ SOLN
10.0000 mL | INTRAMUSCULAR | Status: DC | PRN
  Administered 2015-01-19: 10 mL
  Filled 2015-01-19: qty 10

## 2015-01-19 NOTE — Progress Notes (Signed)
Pt inpatient on unit 300 - went to pt's room 334 to discontinue home infusion pump. Home infusion pump d/c'd.  NS infusing prior to my arrival through upper cap of port.

## 2015-01-19 NOTE — Progress Notes (Signed)
[facility] PROGRESS NOTE  SHELSY SENG YQM:[id number] DOB: 1955/06/27 DOA: 01/18/2015 PCP: [name], MD  Assessment/Plan: N/V -Believe mainly related to ongoing chemo altho UTI also possibly playing a role. -She has intolerances or refuses to take many of the meds that would help her. -We have settled on increasing phenergan dose, adding IV decadron and compazine suppositories. -We can also consider IV ativan as a next step if she fails to improve.  UTI -Continue rocephin pending cx data.  Stage IV Colon Cancer -Currently undergoing chemotherapy. -5 FU pump discontinued today. -Continue OP follow up with oncology as scheduled.  Code Status: Full Code Family Communication: Husband at bedside updated on plan of care and all questions answered.  Disposition Plan: Hope for DC home in 24-48 hours with symptomatic improvement.   Consultants:  None   Antibiotics:  Rocephin   Subjective: Still very symptomatic with active vomiting while I am present in the room.  Objective: Filed Vitals:   01/18/15 1619 01/18/15 1707 01/18/15 2350 01/19/15 0411  BP: 123/61 143/59 131/54 143/61  Pulse: 54 79 50 55  Temp: 97.8 F (36.6 C) 98.3 F (36.8 C) 98 F (36.7 C) 98.6 F (37 C)  TempSrc:  Oral Oral Oral  Resp: 16 16 17 17   Height:  5\' 7"  (1.702 m)    Weight:  44.951 kg (99 lb 1.6 oz)    SpO2: 100% 99% 99% 100%    Intake/Output Summary (Last 24 hours) at 01/19/15 1421 Last data filed at 01/19/15 1330  Gross per 24 hour  Intake 1161.67 ml  Output      3 ml  Net 1158.67 ml   Filed Weights   01/18/15 1055 01/18/15 1707  Weight: 44.634 kg (98 lb 6.4 oz) 44.951 kg (99 lb 1.6 oz)    Exam:   General:  AA Ox3  Cardiovascular: RRR  Respiratory: CTA B  Abdomen: S/NT/ND/+BS  Extremities: no C/C/E   Neurologic:  Grossly intact and non-focal  Data Reviewed: Basic Metabolic Panel:  Recent Labs Lab 01/17/15 0920 01/18/15 1340 01/19/15 0641  NA 136  139 140  K 4.3 4.0 4.0  CL 102 98* 99*  CO2 27 32 31  GLUCOSE 71 107* 120*  BUN 18 29* 37*  CREATININE 0.54 0.64 0.69  CALCIUM 9.2 9.5 9.2   Liver Function Tests:  Recent Labs Lab 01/17/15 0920 01/19/15 0641  AST 40 55*  ALT 40 59*  ALKPHOS 183* 191*  BILITOT 0.2* 0.7  PROT 7.3 7.5  ALBUMIN 3.7 3.9   No results for input(s): LIPASE, AMYLASE in the last 168 hours. No results for input(s): AMMONIA in the last 168 hours. CBC:  Recent Labs Lab 01/17/15 0920 01/18/15 1340 01/19/15 0641  WBC 7.4 9.5 7.5  NEUTROABS 4.0 7.8*  --   HGB 10.3* 11.1* 10.8*  HCT 32.4* 34.1* 33.7*  MCV 90.3 89.3 90.8  PLT 158 171 164   Cardiac Enzymes: No results for input(s): CKTOTAL, CKMB, CKMBINDEX, TROPONINI in the last 168 hours. BNP (last 3 results) No results for input(s): BNP in the last 8760 hours.  ProBNP (last 3 results) No results for input(s): PROBNP in the last 8760 hours.  CBG: No results for input(s): GLUCAP in the last 168 hours.  Recent Results (from the past 240 hour(s))  Urine culture     Status: None (Preliminary result)   Collection Time: 01/18/15 11:30 AM  Result Value Ref Range Status   Specimen Description URINE, CLEAN CATCH  Final  Special Requests NONE  Final   Culture   Final    NO GROWTH < 12 HOURS Performed at [facility]    Report Status PENDING  Incomplete     Studies: No results found.  Scheduled Meds: . cefTRIAXone (ROCEPHIN)  IV  1 g Intravenous Q24H  . dexamethasone  2 mg Intravenous Q12H  . enoxaparin (LOVENOX) injection  40 mg Subcutaneous Q24H  . feeding supplement (ENSURE ENLIVE)  237 mL Oral BID WC  . fentaNYL (SUBLIMAZE) injection  50 mcg Intravenous Once  . fluticasone  1 spray Each Nare QHS  . multivitamin with minerals  1 tablet Oral Daily  . nicotine  7 mg Transdermal Daily  . vitamin B-12  1,500 mcg Oral Daily   Continuous Infusions: . 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 Greater than 50% of this time was spent in direct contact with the patient coordinating care.    Lelon Frohlich  [facility] Pager [phone]  If 7PM-7AM, please contact night-coverage at [email or web address], password [facility] 01/19/2015, 2:21 PM  LOS: 1 day

## 2015-01-19 NOTE — Care Management Note (Signed)
Case Management Note  Patient Details  Name: Sydney Morrison MRN: [id number] Date of Birth: [birth year]  Subjective/Objective:                  Pt admitted from home with nausea, vomiting, and UTI. Pt lives with her husband and will return home at discharge. Pt is independent with ADL's.  Action/Plan: No CM needs noted.  Expected Discharge Date:  01/19/15               Expected Discharge Plan:  Home/Self Care  In-House Referral:  NA  Discharge planning Services  CM Consult  Post Acute Care Choice:  NA Choice offered to:  NA  DME Arranged:    DME Agency:     HH Arranged:    HH Agency:     Status of Service:  Completed, signed off  Medicare Important Message Given:    Date Medicare IM Given:    Medicare IM give by:    Date Additional Medicare IM Given:    Additional Medicare Important Message give by:     If discussed at [place] of Stay Meetings, dates discussed:    Additional Comments:  Joylene Draft, RN 01/19/2015, 1:29 PM

## 2015-01-20 ENCOUNTER — Observation Stay (HOSPITAL_COMMUNITY): Payer: Commercial Managed Care - HMO

## 2015-01-20 DIAGNOSIS — G43A0 Cyclical vomiting, not intractable: Secondary | ICD-10-CM

## 2015-01-20 MED ORDER — SODIUM CHLORIDE 0.9 % IV SOLN
25.0000 mg | Freq: Once | INTRAVENOUS | Status: AC
  Administered 2015-01-20: 25 mg via INTRAVENOUS
  Filled 2015-01-20: qty 1

## 2015-01-20 MED ORDER — LORAZEPAM 2 MG/ML IJ SOLN
0.5000 mg | Freq: Once | INTRAMUSCULAR | Status: AC
  Administered 2015-01-20: 0.5 mg via INTRAVENOUS
  Filled 2015-01-20: qty 1

## 2015-01-20 NOTE — Progress Notes (Signed)
[facility] PROGRESS NOTE  SOMA BACHAND [id number]:[id number] DOB: Jan 22, 1956 DOA: 01/18/2015 PCP: [name], MD  Assessment/Plan:  N/V -Believe mainly related to ongoing chemotherapy. -She has intolerances or refuses to take many of the meds that would help her.   -Will attempt IV Phenergan infusion as patient states this has worked for her in the past..  UTI -Culture negative. Will discontinue abx.   Constipation -Feels like her abdomen is distended -hx of SBO however has not eaten in 3 days.  -will order abdominal xray to r/o SBO.  Stage IV Colon Cancer -Currently undergoing chemotherapy. -Continue OP follow up with oncology as scheduled.  Code Status: Full Code Family Communication: Husband at bedside updated on plan of care and all questions answered.  Disposition Plan: Hope for DC home in 24-48 hours with symptomatic improvement.   Consultants:  None  Antibiotics:    Subjective: Still has nausea, vomiting, and abdominal pain. Feels like her abdomen is distended.   Objective: Filed Vitals:   01/19/15 0411 01/19/15 1547 01/19/15 2244 01/20/15 0613  BP: 143/61 144/62 154/65 151/67  Pulse: 55 60 56 62  Temp: 98.6 F (37 C) 98.9 F (37.2 C) 98.5 F (36.9 C) 98.9 F (37.2 C)  TempSrc: Oral Oral Oral Oral  Resp: 17 18 20 20   Height:      Weight:      SpO2: 100% 100% 100% 100%    Intake/Output Summary (Last 24 hours) at 01/20/15 0717 Last data filed at 01/19/15 1832  Gross per 24 hour  Intake     10 ml  Output      7 ml  Net      3 ml   Filed Weights   01/18/15 1055 01/18/15 1707  Weight: 44.634 kg (98 lb 6.4 oz) 44.951 kg (99 lb 1.6 oz)    Exam:   General:  AA Ox3  Cardiovascular: RRR  Respiratory: CTA B  Abdomen: S/NT/ND/+BS  Extremities: no C/C/E   Neurologic:  Grossly intact and non-focal  Data Reviewed: Basic Metabolic Panel:  Recent Labs Lab 01/17/15 0920 01/18/15 1340 01/19/15 0641  NA 136 139 140  K 4.3 4.0 4.0    CL 102 98* 99*  CO2 27 32 31  GLUCOSE 71 107* 120*  BUN 18 29* 37*  CREATININE 0.54 0.64 0.69  CALCIUM 9.2 9.5 9.2   Liver Function Tests:  Recent Labs Lab 01/17/15 0920 01/19/15 0641  AST 40 55*  ALT 40 59*  ALKPHOS 183* 191*  BILITOT 0.2* 0.7  PROT 7.3 7.5  ALBUMIN 3.7 3.9    CBC:  Recent Labs Lab 01/17/15 0920 01/18/15 1340 01/19/15 0641  WBC 7.4 9.5 7.5  NEUTROABS 4.0 7.8*  --   HGB 10.3* 11.1* 10.8*  HCT 32.4* 34.1* 33.7*  MCV 90.3 89.3 90.8  PLT 158 171 164      Recent Results (from the past 240 hour(s))  Urine culture     Status: None   Collection Time: 01/18/15 11:30 AM  Result Value Ref Range Status   Specimen Description URINE, CLEAN CATCH  Final   Special Requests NONE  Final   Culture   Final    3,000 COLONIES/mL INSIGNIFICANT GROWTH Performed at [facility]    Report Status 01/19/2015 FINAL  Final     Studies: No results found.  Scheduled Meds: . cefTRIAXone (ROCEPHIN)  IV  1 g Intravenous Q24H  . dexamethasone  2 mg Intravenous Q12H  . enoxaparin (LOVENOX) injection  40 mg Subcutaneous Q24H  . feeding supplement (ENSURE ENLIVE)  237 mL Oral BID WC  . fentaNYL (SUBLIMAZE) injection  50 mcg Intravenous Once  . fluticasone  1 spray Each Nare QHS  . multivitamin with minerals  1 tablet Oral Daily  . nicotine  7 mg Transdermal Daily  . vitamin B-12  1,500 mcg Oral Daily   Continuous Infusions: . 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 Greater than 50% of this time was spent in direct contact with the patient coordinating care.  Domingo Mend, MD.  [facility] Pager [phone]  If 7PM-7AM, please contact night-coverage at [email or web address], password [facility] 01/20/2015, 7:17 AM  LOS: 2 days     By signing my name below, I, Rosalie Doctor, attest that this  documentation has been prepared under the direction and in the presence of Domingo Mend, MD.  Electronically Signed: Rosalie Doctor, Scribe. 01/20/2015 11:09am    I have reviewed the above documentation for accuracy and completeness, and I agree with the above.  Domingo Mend, MD [facility] Pager: [phone]

## 2015-01-20 NOTE — Progress Notes (Signed)
Patient vomited x1 but refuses antiemetics. Says "nothing has worked".

## 2015-01-20 NOTE — Plan of Care (Signed)
Problem: Phase I Progression Outcomes Goal: OOB as tolerated unless otherwise ordered Outcome: Not Met (add Reason) Pt has continuous nausea and vomiting and reports weakness.      

## 2015-01-21 MED ORDER — LACTULOSE 10 GM/15ML PO SOLN
10.0000 g | Freq: Two times a day (BID) | ORAL | Status: DC
  Administered 2015-01-21 – 2015-01-22 (×3): 10 g via ORAL
  Filled 2015-01-21 (×3): qty 30

## 2015-01-21 MED ORDER — SODIUM CHLORIDE 0.9 % IV SOLN
25.0000 mg | Freq: Once | INTRAVENOUS | Status: AC
  Administered 2015-01-21: 25 mg via INTRAVENOUS
  Filled 2015-01-21: qty 1

## 2015-01-21 MED ORDER — LORAZEPAM 2 MG/ML IJ SOLN
0.5000 mg | Freq: Once | INTRAMUSCULAR | Status: AC
  Administered 2015-01-21: 0.5 mg via INTRAVENOUS
  Filled 2015-01-21: qty 1

## 2015-01-21 NOTE — Progress Notes (Signed)
[facility] PROGRESS NOTE  Sydney Morrison [id number]:[id number] DOB: [birth year] DOA: 01/18/2015 PCP: [name], MD  Assessment/Plan:  N/V -Believe mainly related to ongoing chemotherapy. -She has intolerances or refuses to take many of the meds that would help her.   -States she has relief with phenergan infusion (25 mg in 1000 cc saline) but not with same dose phenergan injections q6 h. -Remains on a phenergan infusion today  UTI -Culture negative. Will discontinue abx.   Constipation -Feels like her abdomen is distended -hx of SBO however has not eaten in 3 days.  -XRay without evidence for SBO. -Will order lactulose at patient's request.  Stage IV Colon Cancer -Currently undergoing chemotherapy. -Continue OP follow up with oncology as scheduled.  Code Status: Full Code Family Communication: Patient only Disposition Plan: Hope for DC home in 24-48 hours with symptomatic improvement.   Consultants:  None  Antibiotics:  None  Subjective: Feels improved this am.  Objective: Filed Vitals:   01/20/15 0613 01/20/15 1426 01/20/15 2026 01/21/15 0552  BP: 151/67 148/70 136/58 149/66  Pulse: 62 70 63 61  Temp: 98.9 F (37.2 C) 98.6 F (37 C) 98.7 F (37.1 C) 98.4 F (36.9 C)  TempSrc: Oral  Oral Oral  Resp: 20 20 17 16   Height:      Weight:      SpO2: 100% 100% 99% 98%    Intake/Output Summary (Last 24 hours) at 01/21/15 1346 Last data filed at 01/21/15 0900  Gross per 24 hour  Intake      0 ml  Output    551 ml  Net   -551 ml   Filed Weights   01/18/15 1055 01/18/15 1707  Weight: 44.634 kg (98 lb 6.4 oz) 44.951 kg (99 lb 1.6 oz)    Exam:   General:  AA Ox3  Cardiovascular: RRR  Respiratory: CTA B  Abdomen: S/NT/ND/+BS  Extremities: no C/C/E   Neurologic:  Grossly intact and non-focal  Data Reviewed: Basic Metabolic Panel:  Recent Labs Lab 01/17/15 0920 01/18/15 1340 01/19/15 0641  NA 136 139 140  K 4.3 4.0 4.0  CL 102 98*  99*  CO2 27 32 31  GLUCOSE 71 107* 120*  BUN 18 29* 37*  CREATININE 0.54 0.64 0.69  CALCIUM 9.2 9.5 9.2   Liver Function Tests:  Recent Labs Lab 01/17/15 0920 01/19/15 0641  AST 40 55*  ALT 40 59*  ALKPHOS 183* 191*  BILITOT 0.2* 0.7  PROT 7.3 7.5  ALBUMIN 3.7 3.9    CBC:  Recent Labs Lab 01/17/15 0920 01/18/15 1340 01/19/15 0641  WBC 7.4 9.5 7.5  NEUTROABS 4.0 7.8*  --   HGB 10.3* 11.1* 10.8*  HCT 32.4* 34.1* 33.7*  MCV 90.3 89.3 90.8  PLT 158 171 164      Recent Results (from the past 240 hour(s))  Urine culture     Status: None   Collection Time: 01/18/15 11:30 AM  Result Value Ref Range Status   Specimen Description URINE, CLEAN CATCH  Final   Special Requests NONE  Final   Culture   Final    3,000 COLONIES/mL INSIGNIFICANT GROWTH Performed at [facility]    Report Status 01/19/2015 FINAL  Final     Studies: Dg Abd 2 Views  01/20/2015  CLINICAL DATA:  Left mid to lower abdominal pain and vomiting. EXAM: ABDOMEN - 2 VIEW COMPARISON:  11/28/2014 FINDINGS: There is no evidence of intraperitoneal free air. Air-fluid levels are present in  a moderately distended stomach. Sequelae of prior bowel resection are identified with a left lower quadrant ostomy present. No dilated loops of bowel suggestive of obstruction are identified. There is a small amount of gas and stool in the residual colon no acute osseous abnormality is seen. Visualized lung bases are clear. IMPRESSION: 1. No evidence of bowel obstruction. 2. Moderately distended stomach containing fluid. Electronically Signed   By: Logan Bores M.D.   On: 01/20/2015 12:28    Scheduled Meds: . dexamethasone  2 mg Intravenous Q12H  . enoxaparin (LOVENOX) injection  40 mg Subcutaneous Q24H  . feeding supplement (ENSURE ENLIVE)  237 mL Oral BID WC  . fentaNYL (SUBLIMAZE) injection  50 mcg Intravenous Once  . fluticasone  1 spray Each Nare QHS  . multivitamin with minerals  1 tablet Oral Daily  .  nicotine  7 mg Transdermal Daily  . vitamin B-12  1,500 mcg Oral Daily   Continuous Infusions: . 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 Greater than 50% of this time was spent in direct contact with the patient coordinating care.  Domingo Mend, MD.  [facility] Pager [phone]  If 7PM-7AM, please contact night-coverage at [email or web address], password [facility] 01/21/2015, 1:46 PM  LOS: 3 days

## 2015-01-21 NOTE — Progress Notes (Signed)
Phenergan infusion complete. Pt vomiting, orders obtained to continue Phenergan infusion.

## 2015-01-22 DIAGNOSIS — R1110 Vomiting, unspecified: Secondary | ICD-10-CM

## 2015-01-22 MED ORDER — LACTULOSE 10 GM/15ML PO SOLN: 20.0000 g | Freq: Two times a day (BID) | ORAL | Status: DC

## 2015-01-22 MED ORDER — PROMETHAZINE HCL 12.5 MG PO TABS: 12.5000 mg | ORAL_TABLET | Freq: Four times a day (QID) | ORAL | Status: DC | PRN

## 2015-01-22 MED ORDER — HEPARIN SOD (PORK) LOCK FLUSH 100 UNIT/ML IV SOLN
500.0000 [IU] | INTRAVENOUS | Status: AC | PRN
  Administered 2015-01-22: 500 [IU]
  Filled 2015-01-22: qty 5

## 2015-01-22 MED ORDER — PROCHLORPERAZINE 25 MG RE SUPP: 25.0000 mg | Freq: Three times a day (TID) | RECTAL | Status: DC | PRN

## 2015-01-22 NOTE — Care Management Note (Signed)
Case Management Note  Patient Details  Name: ZYAIRE DUMAS MRN: [id number] Date of Birth: 1955/05/09  Subjective/Objective:                    Action/Plan:   Expected Discharge Date:  01/19/15               Expected Discharge Plan:  Home/Self Care  In-House Referral:  NA  Discharge planning Services  CM Consult  Post Acute Care Choice:  NA Choice offered to:  NA  DME Arranged:    DME Agency:     HH Arranged:    [place] Agency:     Status of Service:  Completed, signed off  Medicare Important Message Given:    Date Medicare IM Given:    Medicare IM give by:    Date Additional Medicare IM Given:    Additional Medicare Important Message give by:     If discussed at [place] of Stay Meetings, dates discussed:    Additional Comments: Pt discharged home today. No CM needs noted. Christinia Gully [place], RN 01/22/2015, 1:54 PM

## 2015-01-22 NOTE — Discharge Summary (Signed)
Physician Discharge Summary  Sydney Morrison HYW:[id number] DOB: [birth year] DOA: 01/18/2015  PCP: Sydney Neighbors, MD  Admit date: 01/18/2015 Discharge date: 01/22/2015  Time spent: 45 minutes  Recommendations for Outpatient Follow-up:  -Will be discharged home today. -Advised to follow up with Dr. Whitney Morrison as scheduled for next Friday.   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 [name] 10/20: This is a 59 year old lady who has stage IV colon cancer, status post colostomy/surgery and now undergoing chemotherapy, who now presents with heaving yesterday and episodes of vomiting today associated with lower abdominal pain. The pain has been moderate and gradual in onset. There is been no associated fever. There is no hematemesis or rectal bleeding. There is no bloating of her abdomen. Urinalysis is suggestive of UTI. She is now being admitted for further management.  Hospital Course:   N/V -Believe mainly related to ongoing chemotherapy. -She has intolerances or refuses to take many of the meds that would help her.  -States she has relief with phenergan infusion (25 mg in 1000 cc saline) but not with same dose phenergan injections q6 h. -She seems depressed and states "just send me home cause I can be just as miserable at home as I am here". -When I ask her her what prescriptions for nausea she would like she says "nothing, cause nothing has worked for me in the past". -Will send home with compazine suppositories and phenergan tablets in case she seems inclined to use them.  UTI -Culture negative. Will discontinue abx.   Constipation -XRay without evidence for SBO. -Continue lactulose. -Of  note she has not eaten in over 4 days so that may delay her intestinal transit some as well.  Stage IV Colon Cancer -Currently undergoing chemotherapy. -Continue OP follow up with oncology as scheduled.   Procedures:  None   Consultations:  None  Discharge Instructions  Discharge Instructions    Increase activity slowly    Complete by:  As directed             Medication List    STOP taking these medications        dextrose 5 % SOLN 1,000 mL with fluorouracil 5 GM/100ML SOLN      TAKE these medications        acetaminophen 325 MG tablet  Commonly known as:  TYLENOL  Take 325 mg by mouth every 6 (six) hours as needed for fever or headache (headahce).     ADRUCIL IV  Inject into the vein every 14 (fourteen) days.     ALPRAZolam 0.5 MG tablet  Commonly known as:  XANAX  Take 0.125-0.5 mg by mouth at bedtime as needed for sleep.     AVASTIN IV  Inject into the vein every 14 (fourteen) days.     cromolyn 5.2 MG/ACT nasal spray  Commonly known as:  NASALCROM  Place 1 spray into both nostrils at bedtime.     dexamethasone 4 MG tablet  Commonly known as:  DECADRON  Take 1 tablet (4 mg total) by mouth 2 (two) times daily. For 2 days. Begin day of pump disconnect.     ENSURE  Take 237 mLs  by mouth 2 (two) times daily with a meal.     GENERLAC 10 GM/15ML Soln  Generic drug:  lactulose (encephalopathy)  Take 10 g by mouth daily as needed (for constipation).     LEUCOVORIN CALCIUM IV  Inject into the vein. Every 14 days     leucovorin in dextrose 5 % 250 mL  Inject into the vein every 14 (fourteen) days.     lidocaine-prilocaine cream  Commonly known as:  EMLA  Apply 1 application topically as needed. Apply to portacath site 1-2 hours prior to use     multivitamin with minerals tablet  Take 1 tablet by mouth daily.     nicotine 7 mg/24hr patch  Commonly known as:  NICODERM CQ - dosed in mg/24 hr  Place 7 mg onto the skin daily.     oxaliplatin in  dextrose 5 % 500 mL  Inject into the vein every 14 (fourteen) days.     OXALIPLATIN IV  Inject into the vein. Every 14 days     Potassium Bicarb-Citric Acid 20 MEQ Tbef  Take 1 tablet (20 mEq total) by mouth daily. Dissolve in ginger ale     prochlorperazine 25 MG suppository  Commonly known as:  COMPAZINE  Place 1 suppository (25 mg total) rectally every 8 (eight) hours as needed for nausea or vomiting.     promethazine 12.5 MG tablet  Commonly known as:  PHENERGAN  Take 1 tablet (12.5 mg total) by mouth every 6 (six) hours as needed for nausea.     Vitamin B-12 CR 1500 MCG Tbcr  Take 1 tablet by mouth daily.       Allergies  Allergen Reactions  . Avelox [Moxifloxacin Hcl In Nacl] Hives  . Codeine Hives  . Dextromethorphan Hives  . Erythromycin Hives  . Penicillins Hives    No problem with Imipenem  . Percocet [Oxycodone-Acetaminophen] Nausea And Vomiting    Causes immediate vomiting  . Reglan [Metoclopramide]     Heart rate went up  . Zofran [Ondansetron Hcl]     Dry heaves  . Suprep [Na Sulfate-K Sulfate-Mg Sulf] Nausea And Vomiting       Follow-up Information    Follow up with [name], MD.   Specialties:  Hematology and Oncology, Oncology   Why:  as scheduled for this Friday   Contact information:   [address] [place] [address] [phone]        The results of significant diagnostics from this hospitalization (including imaging, microbiology, ancillary and laboratory) are listed below for reference.    Significant Diagnostic Studies: Dg Abd 2 Views  01/20/2015  CLINICAL DATA:  Left mid to lower abdominal pain and vomiting. EXAM: ABDOMEN - 2 VIEW COMPARISON:  11/28/2014 FINDINGS: There is no evidence of intraperitoneal free air. Air-fluid levels are present in a moderately distended stomach. Sequelae of prior bowel resection are identified with a left lower quadrant ostomy present. No dilated loops of bowel suggestive of obstruction  are identified. There is a small amount of gas and stool in the residual colon no acute osseous abnormality is seen. Visualized lung bases are clear. IMPRESSION: 1. No evidence of bowel obstruction. 2. Moderately distended stomach containing fluid. Electronically Signed   By: Logan Bores M.D.   On: 01/20/2015 12:28    Microbiology: Recent Results (from the past 240 hour(s))  Urine culture     Status: None   Collection Time: 01/18/15 11:30 AM  Result Value Ref Range Status  Specimen Description URINE, CLEAN CATCH  Final   Special Requests NONE  Final   Culture   Final    3,000 COLONIES/mL INSIGNIFICANT GROWTH Performed at [facility]    Report Status 01/19/2015 FINAL  Final     Labs: Basic Metabolic Panel:  Recent Labs Lab 01/17/15 0920 01/18/15 1340 01/19/15 0641  NA 136 139 140  K 4.3 4.0 4.0  CL 102 98* 99*  CO2 27 32 31  GLUCOSE 71 107* 120*  BUN 18 29* 37*  CREATININE 0.54 0.64 0.69  CALCIUM 9.2 9.5 9.2   Liver Function Tests:  Recent Labs Lab 01/17/15 0920 01/19/15 0641  AST 40 55*  ALT 40 59*  ALKPHOS 183* 191*  BILITOT 0.2* 0.7  PROT 7.3 7.5  ALBUMIN 3.7 3.9   No results for input(s): LIPASE, AMYLASE in the last 168 hours. No results for input(s): AMMONIA in the last 168 hours. CBC:  Recent Labs Lab 01/17/15 0920 01/18/15 1340 01/19/15 0641  WBC 7.4 9.5 7.5  NEUTROABS 4.0 7.8*  --   HGB 10.3* 11.1* 10.8*  HCT 32.4* 34.1* 33.7*  MCV 90.3 89.3 90.8  PLT 158 171 164   Cardiac Enzymes: No results for input(s): CKTOTAL, CKMB, CKMBINDEX, TROPONINI in the last 168 hours. BNP: BNP (last 3 results) No results for input(s): BNP in the last 8760 hours.  ProBNP (last 3 results) No results for input(s): PROBNP in the last 8760 hours.  CBG: No results for input(s): GLUCAP in the last 168 hours.     SignedLelon Frohlich  [facility] Pager: [phone] 01/22/2015, 1:51 PM

## 2015-01-22 NOTE — Plan of Care (Addendum)
Patient continues to complain of nausea, yet refuses to take phenergan IV push or PO since PB is ALL that works.  Pt insistent that she has Bowel obstruction, but xray did not support. Pt educated that we need to try to progress towards PO phenergan since she cannot be discharged on PB.  Dr. Curly Rim to inform of pt concerns and confirm POC. Orders given in increase lactulose to 20gm.

## 2015-01-22 NOTE — Discharge Planning (Signed)
Pt IV removed and Port-a cath (heparin flushed and deaccessed).  Pt given DC papers, explained and educated.  Pt told of suggested FU appts. And given scripts.  VSS and RN assessment revealed stability for DC to home.  Pt will be wheeled to front and family transporting home via car.

## 2015-01-24 ENCOUNTER — Other Ambulatory Visit (HOSPITAL_COMMUNITY): Payer: Commercial Managed Care - HMO

## 2015-01-26 ENCOUNTER — Ambulatory Visit (HOSPITAL_COMMUNITY): Payer: Commercial Managed Care - HMO | Admitting: Hematology & Oncology

## 2015-01-29 ENCOUNTER — Ambulatory Visit (HOSPITAL_COMMUNITY)
Admit: 2015-01-29 | Discharge: 2015-01-29 | Disposition: A | Discharge status: Home / Self Care | Payer: Commercial Managed Care - HMO | Source: Ambulatory Visit | Attending: Hematology & Oncology | Admitting: Hematology & Oncology

## 2015-01-29 DIAGNOSIS — Z9049 Acquired absence of other specified parts of digestive tract: Secondary | ICD-10-CM | POA: Insufficient documentation

## 2015-01-29 DIAGNOSIS — Z933 Colostomy status: Secondary | ICD-10-CM | POA: Insufficient documentation

## 2015-01-29 DIAGNOSIS — C787 Secondary malignant neoplasm of liver and intrahepatic bile duct: Secondary | ICD-10-CM | POA: Insufficient documentation

## 2015-01-29 DIAGNOSIS — Z08 Encounter for follow-up examination after completed treatment for malignant neoplasm: Secondary | ICD-10-CM | POA: Insufficient documentation

## 2015-01-29 DIAGNOSIS — K5090 Crohn's disease, unspecified, without complications: Secondary | ICD-10-CM | POA: Insufficient documentation

## 2015-01-29 DIAGNOSIS — C189 Malignant neoplasm of colon, unspecified: Secondary | ICD-10-CM

## 2015-01-29 DIAGNOSIS — Z9221 Personal history of antineoplastic chemotherapy: Secondary | ICD-10-CM | POA: Insufficient documentation

## 2015-01-29 MED ORDER — IOHEXOL 300 MG/ML  SOLN
100.0000 mL | Freq: Once | INTRAMUSCULAR | Status: AC | PRN
  Administered 2015-01-29: 100 mL via INTRAVENOUS

## 2015-01-30 ENCOUNTER — Telehealth (HOSPITAL_COMMUNITY): Payer: Self-pay

## 2015-01-30 ENCOUNTER — Telehealth (HOSPITAL_COMMUNITY): Payer: Self-pay | Admitting: Oncology

## 2015-01-30 NOTE — Telephone Encounter (Signed)
-----   Message from Baird Cancer, PA-C sent at 01/29/2015 10:10 PM EDT ----- CT imaging demonstrates progression of disease.  I discussed this information with Dr. [name].  She wants the patient moved from my schedule on 11/2 to be seen by her next week.  Cancel chemotherapy appointment for 11/2.  I will send a message to [facility] to prepare for FOLFIRI teaching, but this will not occur until Dr. [name] discusses this treatment option with the patient next week.  Sydney Morrison,Sydney Morrison

## 2015-01-30 NOTE — Telephone Encounter (Signed)
Call back from patient.  States "I cannot wait until Monday to find out what's going on.  If my scan is worse I can handle it.  What I can't handle is not knowing.  I want either Tom or Dr. [name] to call me today."

## 2015-01-30 NOTE — Telephone Encounter (Signed)
Patient notified that we are cancelling her chemotherapy appointment and office visit for tomorrow because her CTscans revealed some changes that Dr. [name] wants to discuss  with her before any further therapy.  Appointment is schedule for 02/05/15.  Verbalized understanding of instructions.

## 2015-01-30 NOTE — Telephone Encounter (Signed)
I personally reviewed and went over radiographic studies with the patient.  The results are noted within this dictation.  Her CT demonstrates progression of disease with frank growth of index hepatic lesion in addition to new hepatic lesions.  As a result, current regimen is not providing a response.  Therefore, her appointment date and time have been moved at the request of Dr. [name] to review this information in detail and discuss future treatment options.  Next line therapy would be FOLFIRI + Avastin.  Her new appointment date and time is 11/7 in the AM.  KEFALAS,THOMAS, PA-C 01/30/2015 5:05 PM

## 2015-01-31 ENCOUNTER — Ambulatory Visit (HOSPITAL_COMMUNITY): Payer: Commercial Managed Care - HMO | Admitting: Oncology

## 2015-01-31 ENCOUNTER — Inpatient Hospital Stay (HOSPITAL_COMMUNITY): Payer: Commercial Managed Care - HMO

## 2015-02-01 ENCOUNTER — Other Ambulatory Visit (HOSPITAL_COMMUNITY): Payer: Self-pay | Admitting: Hematology & Oncology

## 2015-02-02 ENCOUNTER — Encounter (HOSPITAL_COMMUNITY): Payer: Commercial Managed Care - HMO

## 2015-02-02 NOTE — Patient Instructions (Addendum)
Sydney Morrison   CHEMOTHERAPY INSTRUCTIONS  Irinotecan - diarrhea, bone marrow suppression, hair loss, abdominal cramping. This drug can cause early and late diarrhea. Early diarrhea can occur within 24 hours of administration. We recommend Imodium. For diarrhea, you take Imodium 2 tablets @ the onset of diarrhea, and then 1 tablet every 2 hours until 12 hours have passed without a loose stool. If diarrhea is occurring at night time you may take 2 tablets @ bedtime and every 4 hours until morning. If diarrhea recurs repeat. Call Sydney Morrison and let us know that you are having diarrhea and whether or not the Imodium is working. Diarrhea can lead to dehydration!!!  Leucovorin - this is a medication that is not chemo but given with chemo. This med "rescues" the healthy cells before we administer the drug 5FU. This makes the 5FU work better. (takes 2 hours to infuse- this goes in @ the same time as the Irinotecan chemo)   5FU: bone marrow suppression (low white blood cells - wbcs fight infection, low red blood cells - rbcs make up your blood, low platelets - this is what makes your blood clot, nausea/vomiting, diarrhea, mouth sores, hair loss, dry skin, ocular toxicities (increased tear production, sensitivity to light). You must wear sunscreen/sunglasses. Cover your skin when out in sunlight. You will get burned very easily. The nurse will sit @ your bedside and push this medication in via a syringe. This will take 10 minutes. Then she will connect you to an ambulatory pump with this medication attached. You will wear the pump for 46 hours. The 5FU chemo will infuse for 46 hours total. Then the pump will be removed.   Avastin - this medication is considered an anti-angiogenic therapy. Avastin is thought to work by causing the blood vessels to shrink away from the tumor, blocking the supply of oxygen and nutrients that the tumor needs to grow. Avastin may also cause the  existing blood vessels to change in ways that help the chemotherapy reach the tumor more effectively. Avastin may also work to interfere with the growth of new blood vessels, causing the tumor to starve. Side Effects: Nosebleeds, High Blood Pressure, Protein in Urine, & diarrhea. (takes 30 minutes to infuse)   POTENTIAL SIDE EFFECTS OF TREATMENT: Increased Susceptibility to Infection, Vomiting, Constipation, Hair Thinning, Changes in Character of Skin and Nails (brittleness, dryness,etc.), Bone Marrow Suppression, Abdominal Cramping, Complete Hair Loss, Nausea, Diarrhea, Sun Sensitivity and Mouth Sores    EDUCATIONAL MATERIALS GIVEN AND REVIEWED: Specific Instructions Sheets: Irinotecan, Leucovorin, 5FU, Imodium   SELF CARE ACTIVITIES WHILE ON CHEMOTHERAPY: Increase your fluid intake 48 hours prior to treatment and drink at least 2 quarts per day after treatment., No alcohol intake., No aspirin or other medications unless approved by your oncologist., Eat foods that are light and easy to digest., Eat foods at cold or room temperature., No fried, fatty, or spicy foods immediately before or after treatment., Have teeth cleaned professionally before starting treatment. Keep dentures and partial plates clean., Use soft toothbrush and do not use mouthwashes that contain alcohol. Biotene is a good mouthwash that is available at most pharmacies or may be ordered by calling [phone]., Use warm salt water gargles (1 teaspoon salt per 1 quart warm water) before and after meals and at bedtime. Or you may rinse with 2 tablespoons of three -percent hydrogen peroxide mixed in eight ounces of water., Always use sunscreen with SPF (Sun Protection Factor) of 30 or  higher., Use your nausea medication as directed to prevent nausea., Use your stool softener or laxative as directed to prevent constipation. and Use your anti-diarrheal medication as directed to stop diarrhea.  Please wash your hands for at least 30  seconds using warm soapy water. Handwashing is the #1 way to prevent the spread of germs. Stay away from sick people or people who are getting over a cold. If you develop respiratory systems such as green/yellow mucus production or productive cough or persistent cough let us know and we will see if you need an antibiotic. It is a good idea to keep a pair of gloves on when going into grocery stores/[employer] to decrease your risk of coming into contact with germs on the carts, etc. Carry alcohol hand gel with you at all times and use it frequently if out in public. All foods need to be cooked thoroughly. No raw foods. No medium or undercooked meats, eggs. If your food is cooked medium well, it does not need to be hot pink or saturated with bloody liquid at all. Vegetables and fruits need to be washed/rinsed under the faucet with a dish detergent before being consumed. You can eat raw fruits and vegetables unless we tell you otherwise but it would be best if you cooked them or bought frozen. Do not eat off of salad bars or hot bars unless you really trust the cleanliness of the restaurant. If you need dental work, please let Sydney Morrison know before you go for your appointment so that we can coordinate the best possible time for you in regards to your chemo regimen. You need to also let your dentist know that you are actively taking chemo. We may need to do labs prior to your dental appointment. We also want your bowels moving at least every other day. If this is not happening, we need to know so that we can get you on a bowel regimen to help you go.      MEDICATIONS: You have been given prescriptions for the following medications:  Phenergan 12.5mg  tablet. Take 1 tablet by mouth every 6 hours as needed for nausea/vomiting.   Compazine 25mg  suppository. Insert 1 suppository rectally every 8 hours as needed for nausea/vomiting.   EMLA cream. Apply a quarter size amount to port site 1 hour prior to chemo. Do  not rub in. Cover with plastic wrap.  Over-the-Counter Meds:  Miralax 1 capful in 8 oz of fluid daily. May increase to two times a day if needed. This is a stool softener. If this doesn't work proceed you can add:  Senokot S  - start with 1 tablet two times a day and increase to 4 tablets two times a day if needed. (total of 8 tablets in a 24 hour period). This is a stimulant laxative.   Call us if this does not help your bowels move.   Imodium 2mg  capsule. Take 2 capsules after the 1st loose stool and then 1 capsule every 2 hours until you go a total of 12 hours without having a loose stool. Call the [facility] if loose stools continue.   Over-the-Counter Meds:  Miralax 1 capful in 8 oz of fluid daily. May increase to two times a day if needed. This is a stool softener. If this doesn't work proceed you can add:  Senokot S - start with 1 tablet two times a day and increase to 4 tablets two times a day if needed. (total of 8 tablets in a  24 hour period). This is a stimulant laxative.   Call us if this does not help your bowels move.   Imodium 2mg  capsule. Take 2 capsules after the 1st loose stool and then 1 capsule every 2 hours until you go a total of 12 hours without having a loose stool. If it is night time and you have diarrhea, take 2 capsules at bedtime and then 2 capsules every 4 hours until the morning. Call the [facility] if loose stools continue.    SYMPTOMS TO REPORT AS SOON AS POSSIBLE AFTER TREATMENT:  FEVER GREATER THAN 100.5 F  CHILLS WITH OR WITHOUT FEVER  NAUSEA AND VOMITING THAT IS NOT CONTROLLED WITH YOUR NAUSEA MEDICATION  UNUSUAL SHORTNESS OF BREATH  UNUSUAL BRUISING OR BLEEDING  TENDERNESS IN MOUTH AND THROAT WITH OR WITHOUT PRESENCE OF ULCERS  URINARY PROBLEMS  BOWEL PROBLEMS  UNUSUAL RASH   Wear comfortable clothing and clothing appropriate for easy access to any Portacath or PICC line. Let us know if there is anything that we can do to  make your therapy better!      I have been informed and understand all of the instructions given to me and have received a copy. I have been instructed to call the clinic [phone] or my family physician as soon as possible for continued medical care, if indicated. I do not have any more questions at this time but understand that I may call the [facility] or the Patient Navigator at [phone] during office hours should I have questions or need assistance in obtaining follow-up care.        Irinotecan injection What is this medicine? IRINOTECAN (ir in oh TEE kan ) is a chemotherapy drug. It is used to treat colon and rectal cancer. This medicine may be used for other purposes; ask your health care provider or pharmacist if you have questions. What should I tell my health care provider before I take this medicine? They need to know if you have any of these conditions: -blood disorders -dehydration -diarrhea -infection (especially a virus infection such as chickenpox, cold sores, or herpes) -liver disease -low blood counts, like low white cell, platelet, or red cell counts -recent or ongoing radiation therapy -an unusual or allergic reaction to irinotecan, sorbitol, other chemotherapy, other medicines, foods, dyes, or preservatives -pregnant or trying to get pregnant -breast-feeding How should I use this medicine? This drug is given as an infusion into a vein. It is administered in a hospital or clinic by a specially trained health care professional. Talk to your pediatrician regarding the use of this medicine in children. Special care may be needed. Overdosage: If you think you have taken too much of this medicine contact a poison control center or emergency room at once. NOTE: This medicine is only for you. Do not share this medicine with others. What if I miss a dose? It is important not to miss your dose. Call your doctor or health care professional if you are unable  to keep an appointment. What may interact with this medicine? Do not take this medicine with any of the following medications: -atazanavir -certain medicines for fungal infections like itraconazole and ketoconazole -St. John's Wort This medicine may also interact with the following medications: -dexamethasone -diuretics -laxatives -medicines for seizures like carbamazepine, mephobarbital, phenobarbital, phenytoin, primidone -medicines to increase blood counts like filgrastim, pegfilgrastim, sargramostim -prochlorperazine -vaccines This list may not describe all possible interactions. Give your health care provider a list of all  the medicines, herbs, non-prescription drugs, or dietary supplements you use. Also tell them if you smoke, drink alcohol, or use illegal drugs. Some items may interact with your medicine. What should I watch for while using this medicine? Your condition will be monitored carefully while you are receiving this medicine. You will need important blood work done while you are taking this medicine. This drug may make you feel generally unwell. This is not uncommon, as chemotherapy can affect healthy cells as well as cancer cells. Report any side effects. Continue your course of treatment even though you feel ill unless your doctor tells you to stop. In some cases, you may be given additional medicines to help with side effects. Follow all directions for their use. You may get drowsy or dizzy. Do not drive, use machinery, or do anything that needs mental alertness until you know how this medicine affects you. Do not stand or sit up quickly, especially if you are an older patient. This reduces the risk of dizzy or fainting spells. Call your doctor or health care professional for advice if you get a fever, chills or sore throat, or other symptoms of a cold or flu. Do not treat yourself. This drug decreases your body's ability to fight infections. Try to avoid being around people  who are sick. This medicine may increase your risk to bruise or bleed. Call your doctor or health care professional if you notice any unusual bleeding. Be careful brushing and flossing your teeth or using a toothpick because you may get an infection or bleed more easily. If you have any dental work done, tell your dentist you are receiving this medicine. Avoid taking products that contain aspirin, acetaminophen, ibuprofen, naproxen, or ketoprofen unless instructed by your doctor. These medicines may hide a fever. Do not become pregnant while taking this medicine. Women should inform their doctor if they wish to become pregnant or think they might be pregnant. There is a potential for serious side effects to an unborn child. Talk to your health care professional or pharmacist for more information. Do not breast-feed an infant while taking this medicine. What side effects may I notice from receiving this medicine? Side effects that you should report to your doctor or health care professional as soon as possible: -allergic reactions like skin rash, itching or hives, swelling of the face, lips, or tongue -low blood counts - this medicine may decrease the number of white blood cells, red blood cells and platelets. You may be at increased risk for infections and bleeding. -signs of infection - fever or chills, cough, sore throat, pain or difficulty passing urine -signs of decreased platelets or bleeding - bruising, pinpoint red spots on the skin, black, tarry stools, blood in the urine -signs of decreased red blood cells - unusually weak or tired, fainting spells, lightheadedness -breathing problems -chest pain -diarrhea -feeling faint or lightheaded, falls -flushing, runny nose, sweating during infusion -mouth sores or pain -pain, swelling, redness or irritation where injected -pain, swelling, warmth in the leg -pain, tingling, numbness in the hands or feet -problems with balance, talking,  walking -stomach cramps, pain -trouble passing urine or change in the amount of urine -vomiting as to be unable to hold down drinks or food -yellowing of the eyes or skin Side effects that usually do not require medical attention (report to your doctor or health care professional if they continue or are bothersome): -constipation -hair loss -headache -loss of appetite -nausea, vomiting -stomach upset This list may not describe  all possible side effects. Call your doctor for medical advice about side effects. You may report side effects to FDA at 1-800-FDA-1088. Where should I keep my medicine? This drug is given in a hospital or clinic and will not be stored at home. NOTE: This sheet is a summary. It may not cover all possible information. If you have questions about this medicine, talk to your doctor, pharmacist, or health care provider.    2016, Elsevier/[employer] Standard. (2012-09-13 16:29:32) Leucovorin injection What is this medicine? LEUCOVORIN (loo koe VOR in) is used to prevent or treat the harmful effects of some medicines. This medicine is used to treat anemia caused by a low amount of folic acid in the body. It is also used with 5-fluorouracil (5-FU) to treat colon cancer. This medicine may be used for other purposes; ask your health care provider or pharmacist if you have questions. What should I tell my health care provider before I take this medicine? They need to know if you have any of these conditions: -anemia from low levels of vitamin B-12 in the blood -an unusual or allergic reaction to leucovorin, folic acid, other medicines, foods, dyes, or preservatives -pregnant or trying to get pregnant -breast-feeding How should I use this medicine? This medicine is for injection into a muscle or into a vein. It is given by a health care professional in a hospital or clinic setting. Talk to your pediatrician regarding the use of this medicine in children. Special care may be  needed. Overdosage: If you think you have taken too much of this medicine contact a poison control center or emergency room at once. NOTE: This medicine is only for you. Do not share this medicine with others. What if I miss a dose? This does not apply. What may interact with this medicine? -capecitabine -fluorouracil -phenobarbital -phenytoin -primidone -trimethoprim-sulfamethoxazole This list may not describe all possible interactions. Give your health care provider a list of all the medicines, herbs, non-prescription drugs, or dietary supplements you use. Also tell them if you smoke, drink alcohol, or use illegal drugs. Some items may interact with your medicine. What should I watch for while using this medicine? Your condition will be monitored carefully while you are receiving this medicine. This medicine may increase the side effects of 5-fluorouracil, 5-FU. Tell your doctor or health care professional if you have diarrhea or mouth sores that do not get better or that get worse. What side effects may I notice from receiving this medicine? Side effects that you should report to your doctor or health care professional as soon as possible: -allergic reactions like skin rash, itching or hives, swelling of the face, lips, or tongue -breathing problems -fever, infection -mouth sores -unusual bleeding or bruising -unusually weak or tired Side effects that usually do not require medical attention (report to your doctor or health care professional if they continue or are bothersome): -constipation or diarrhea -loss of appetite -nausea, vomiting This list may not describe all possible side effects. Call your doctor for medical advice about side effects. You may report side effects to FDA at 1-800-FDA-1088. Where should I keep my medicine? This drug is given in a hospital or clinic and will not be stored at home. NOTE: This sheet is a summary. It may not cover all possible information. If  you have questions about this medicine, talk to your doctor, pharmacist, or health care provider.    2016, Elsevier/Gold Standard. (2007-09-21 16:50:29) Fluorouracil, 5-FU injection What is this medicine? FLUOROURACIL, 5-FU (flure  oh YOOR a sil) is a chemotherapy drug. It slows the growth of cancer cells. This medicine is used to treat many types of cancer like breast cancer, colon or rectal cancer, pancreatic cancer, and stomach cancer. This medicine may be used for other purposes; ask your health care provider or pharmacist if you have questions. What should I tell my health care provider before I take this medicine? They need to know if you have any of these conditions: -blood disorders -dihydropyrimidine dehydrogenase (DPD) deficiency -infection (especially a virus infection such as chickenpox, cold sores, or herpes) -kidney disease -liver disease -malnourished, poor nutrition -recent or ongoing radiation therapy -an unusual or allergic reaction to fluorouracil, other chemotherapy, other medicines, foods, dyes, or preservatives -pregnant or trying to get pregnant -breast-feeding How should I use this medicine? This drug is given as an infusion or injection into a vein. It is administered in a hospital or clinic by a specially trained health care professional. Talk to your pediatrician regarding the use of this medicine in children. Special care may be needed. Overdosage: If you think you have taken too much of this medicine contact a poison control center or emergency room at once. NOTE: This medicine is only for you. Do not share this medicine with others. What if I miss a dose? It is important not to miss your dose. Call your doctor or health care professional if you are unable to keep an appointment. What may interact with this medicine? -allopurinol -cimetidine -dapsone -digoxin -hydroxyurea -leucovorin -levamisole -medicines for seizures like ethotoin, fosphenytoin,  phenytoin -medicines to increase blood counts like filgrastim, pegfilgrastim, sargramostim -medicines that treat or prevent blood clots like warfarin, enoxaparin, and dalteparin -methotrexate -metronidazole -pyrimethamine -some other chemotherapy drugs like busulfan, cisplatin, estramustine, vinblastine -trimethoprim -trimetrexate -vaccines Talk to your doctor or health care professional before taking any of these medicines: -acetaminophen -aspirin -ibuprofen -ketoprofen -naproxen This list may not describe all possible interactions. Give your health care provider a list of all the medicines, herbs, non-prescription drugs, or dietary supplements you use. Also tell them if you smoke, drink alcohol, or use illegal drugs. Some items may interact with your medicine. What should I watch for while using this medicine? Visit your doctor for checks on your progress. This drug may make you feel generally unwell. This is not uncommon, as chemotherapy can affect healthy cells as well as cancer cells. Report any side effects. Continue your course of treatment even though you feel ill unless your doctor tells you to stop. In some cases, you may be given additional medicines to help with side effects. Follow all directions for their use. Call your doctor or health care professional for advice if you get a fever, chills or sore throat, or other symptoms of a cold or flu. Do not treat yourself. This drug decreases your body's ability to fight infections. Try to avoid being around people who are sick. This medicine may increase your risk to bruise or bleed. Call your doctor or health care professional if you notice any unusual bleeding. Be careful brushing and flossing your teeth or using a toothpick because you may get an infection or bleed more easily. If you have any dental work done, tell your dentist you are receiving this medicine. Avoid taking products that contain aspirin, acetaminophen, ibuprofen,  naproxen, or ketoprofen unless instructed by your doctor. These medicines may hide a fever. Do not become pregnant while taking this medicine. Women should inform their doctor if they wish to become pregnant or  think they might be pregnant. There is a potential for serious side effects to an unborn child. Talk to your health care professional or pharmacist for more information. Do not breast-feed an infant while taking this medicine. Men should inform their doctor if they wish to father a child. This medicine may lower sperm counts. Do not treat diarrhea with over the counter products. Contact your doctor if you have diarrhea that lasts more than 2 days or if it is severe and watery. This medicine can make you more sensitive to the sun. Keep out of the sun. If you cannot avoid being in the sun, wear protective clothing and use sunscreen. Do not use sun lamps or tanning beds/booths. What side effects may I notice from receiving this medicine? Side effects that you should report to your doctor or health care professional as soon as possible: -allergic reactions like skin rash, itching or hives, swelling of the face, lips, or tongue -low blood counts - this medicine may decrease the number of white blood cells, red blood cells and platelets. You may be at increased risk for infections and bleeding. -signs of infection - fever or chills, cough, sore throat, pain or difficulty passing urine -signs of decreased platelets or bleeding - bruising, pinpoint red spots on the skin, black, tarry stools, blood in the urine -signs of decreased red blood cells - unusually weak or tired, fainting spells, lightheadedness -breathing problems -changes in vision -chest pain -mouth sores -nausea and vomiting -pain, swelling, redness at site where injected -pain, tingling, numbness in the hands or feet -redness, swelling, or sores on hands or feet -stomach pain -unusual bleeding Side effects that usually do not  require medical attention (report to your doctor or health care professional if they continue or are bothersome): -changes in finger or toe nails -diarrhea -dry or itchy skin -hair loss -headache -loss of appetite -sensitivity of eyes to the light -stomach upset -unusually teary eyes This list may not describe all possible side effects. Call your doctor for medical advice about side effects. You may report side effects to FDA at 1-800-FDA-1088. Where should I keep my medicine? This drug is given in a hospital or clinic and will not be stored at home. NOTE: This sheet is a summary. It may not cover all possible information. If you have questions about this medicine, talk to your doctor, pharmacist, or health care provider.    2016, [employer] Standard. (2007-07-21 13:53:16) Bevacizumab injection What is this medicine? BEVACIZUMAB (be va SIZ yoo mab) is a monoclonal antibody. It is used to treat cervical cancer, colorectal cancer, glioblastoma multiforme, non-small cell lung cancer (NSCLC), ovarian cancer, and renal cell cancer. This medicine may be used for other purposes; ask your health care provider or pharmacist if you have questions. What should I tell my health care provider before I take this medicine? They need to know if you have any of these conditions: -blood clots -heart disease, including heart failure, heart attack, or chest pain (angina) -high blood pressure -infection (especially a virus infection such as chickenpox, cold sores, or herpes) -kidney disease -lung disease -prior chemotherapy with doxorubicin, daunorubicin, epirubicin, or other anthracycline type chemotherapy agents -recent or ongoing radiation therapy -recent surgery -stroke -an unusual or allergic reaction to bevacizumab, hamster proteins, mouse proteins, other medicines, foods, dyes, or preservatives -pregnant or trying to get pregnant -breast-feeding How should I use this medicine? This medicine  is for infusion into a vein. It is given by a health care professional in  a hospital or clinic setting. Talk to your pediatrician regarding the use of this medicine in children. Special care may be needed. Overdosage: If you think you have taken too much of this medicine contact a poison control center or emergency room at once. NOTE: This medicine is only for you. Do not share this medicine with others. What if I miss a dose? It is important not to miss your dose. Call your doctor or health care professional if you are unable to keep an appointment. What may interact with this medicine? Interactions are not expected. This list may not describe all possible interactions. Give your health care provider a list of all the medicines, herbs, non-prescription drugs, or dietary supplements you use. Also tell them if you smoke, drink alcohol, or use illegal drugs. Some items may interact with your medicine. What should I watch for while using this medicine? Your condition will be monitored carefully while you are receiving this medicine. You will need important blood work and urine testing done while you are taking this medicine. During your treatment, let your health care professional know if you have any unusual symptoms, such as difficulty breathing. This medicine may rarely cause 'gastrointestinal perforation' (holes in the stomach, intestines or colon), a serious side effect requiring surgery to repair. This medicine should be started at least 28 days following major surgery and the site of the surgery should be totally healed. Check with your doctor before scheduling dental work or surgery while you are receiving this treatment. Talk to your doctor if you have recently had surgery or if you have a wound that has not healed. Do not become pregnant while taking this medicine or for 6 months after stopping it. Women should inform their doctor if they wish to become pregnant or think they might be pregnant.  There is a potential for serious side effects to an unborn child. Talk to your health care professional or pharmacist for more information. Do not breast-feed an infant while taking this medicine. This medicine has caused ovarian failure in some women. This medicine may interfere with the ability to have a child. You should talk to your doctor or health care professional if you are concerned about your fertility. What side effects may I notice from receiving this medicine? Side effects that you should report to your doctor or health care professional as soon as possible: -allergic reactions like skin rash, itching or hives, swelling of the face, lips, or tongue -signs of infection - fever or chills, cough, sore throat, pain or trouble passing urine -signs of decreased platelets or bleeding - bruising, pinpoint red spots on the skin, black, tarry stools, nosebleeds, blood in the urine -breathing problems -changes in vision -chest pain -confusion -jaw pain, especially after dental work -mouth sores -seizures -severe abdominal pain -severe headache -sudden numbness or weakness of the face, arm or leg -swelling of legs or ankles -symptoms of a stroke: change in mental awareness, inability to talk or move one side of the body (especially in patients with lung cancer) -trouble passing urine or change in the amount of urine -trouble speaking or understanding -trouble walking, dizziness, loss of balance or coordination Side effects that usually do not require medical attention (report to your doctor or health care professional if they continue or are bothersome): -constipation -diarrhea -dry skin -headache -loss of appetite -nausea, vomiting This list may not describe all possible side effects. Call your doctor for medical advice about side effects. You may report side effects to  FDA at 1-800-FDA-1088. Where should I keep my medicine? This drug is given in a hospital or clinic and will not be  stored at home. NOTE: This sheet is a summary. It may not cover all possible information. If you have questions about this medicine, talk to your doctor, pharmacist, or health care provider.    2016, [employer] Standard. (2014-05-16 16:58:44) Loperamide tablets or capsules What is this medicine? LOPERAMIDE (loe PER a mide) is used to treat diarrhea. This medicine may be used for other purposes; ask your health care provider or pharmacist if you have questions. What should I tell my health care provider before I take this medicine? They need to know if you have any of these conditions: -a black or bloody stool -bacterial food poisoning -colitis or mucus in your stool -currently taking an antibiotic medication for an infection -fever -liver disease -severe abdominal pain, swelling or bulging -an unusual or allergic reaction to loperamide, other medicines, foods, dyes, or preservatives -pregnant or trying to get pregnant -breast-feeding How should I use this medicine? Take this medicine by mouth with a glass of water. Follow the directions on the prescription label. Take your doses at regular intervals. Do not take your medicine more often than directed. Talk to your pediatrician regarding the use of this medicine in children. Special care may be needed. Overdosage: If you think you have taken too much of this medicine contact a poison control center or emergency room at once. NOTE: This medicine is only for you. Do not share this medicine with others. What if I miss a dose? This does not apply. This medicine is not for regular use. Only take this medicine while you continue to have loose bowel movements. Do not take more medicine than recommended by the packaging label or by your healthcare professional. What may interact with this medicine? Do not take this medicine with any of the following medications: - alosetron This medicine may also interact with the following  medications: -cimetidine -clarithromycin -erythromycin -gemfibrozil -itraconazole -ketoconazole -quinidine -quinine -ranitidine -ritonavir -saquinavir This list may not describe all possible interactions. Give your health care provider a list of all the medicines, herbs, non-prescription drugs, or dietary supplements you use. Also tell them if you smoke, drink alcohol, or use illegal drugs. Some items may interact with your medicine. What should I watch for while using this medicine? Do not take this medicine for more than 2 days without asking your doctor or health care professional. Do not use doses higher than those prescribed by your doctor or listed on the label. Check with your doctor or health care professional right away if you develop a fever, severe abdominal pain, swelling or bulging, or if you have have bloody/black diarrhea or stools. You may get drowsy or dizzy. Do not drive, use machinery, or do anything that needs mental alertness until you know how this medicine affects you. Do not stand or sit up quickly, especially if you are an older patient. This reduces the risk of dizzy or fainting spells. Alcohol can increase possible drowsiness and dizziness. Avoid alcoholic drinks. Your mouth may get dry. Chewing sugarless gum or sucking hard candy, and drinking plenty of water may help. Contact your doctor if the problem does not go away or is severe. Drinking plenty of water can also help prevent dehydration that can occur with diarrhea. Elderly patients may have a more variable response to the effects of this medicine, and are more susceptible to the effects of dehydration. What side  effects may I notice from receiving this medicine? Side effects that you should report to your doctor or health care professional as soon as possible: - allergic reactions like skin rash, itching or hives, swelling of the face, lips, or tongue -bloated, swollen feeling in your abdomen -blurred  vision -loss of appetite -signs and symptoms of a dangerous change in heartbeat or heart rhythm like chest pain; dizziness; fast or irregular heartbeat palpitations; feeling faint or lightheaded, falls; breathing problems -stomach pain Side effects that usually do not require medical attention (report to your doctor or health care professional if they continue or are bothersome): - constipation -drowsiness or dizziness -dry mouth -nausea, vomiting This list may not describe all possible side effects. Call your doctor for medical advice about side effects. You may report side effects to FDA at 1-800-FDA-1088. Where should I keep my medicine? Keep out of the reach of children. Store at room temperature between 15 and 25 degrees C (59 and 77 degrees F). Keep container tightly closed. Throw away any unused medicine after the expiration date. NOTE: This sheet is a summary. It may not cover all possible information. If you have questions about this medicine, talk to your doctor, pharmacist, or health care provider.    2016, [employer] Standard. (2014-09-12 15:29:29)

## 2015-02-05 ENCOUNTER — Encounter (HOSPITAL_COMMUNITY): Payer: Self-pay | Admitting: Hematology & Oncology

## 2015-02-05 ENCOUNTER — Encounter (HOSPITAL_COMMUNITY): Payer: Commercial Managed Care - HMO | Attending: Hematology & Oncology | Admitting: Hematology & Oncology

## 2015-02-05 ENCOUNTER — Encounter (HOSPITAL_COMMUNITY): Payer: Commercial Managed Care - HMO

## 2015-02-05 VITALS — BP 128/70 | HR 75 | Temp 98.1°F | Resp 16 | Wt 96.8 lb

## 2015-02-05 DIAGNOSIS — C189 Malignant neoplasm of colon, unspecified: Secondary | ICD-10-CM

## 2015-02-05 DIAGNOSIS — C786 Secondary malignant neoplasm of retroperitoneum and peritoneum: Secondary | ICD-10-CM

## 2015-02-05 DIAGNOSIS — C787 Secondary malignant neoplasm of liver and intrahepatic bile duct: Principal | ICD-10-CM

## 2015-02-05 DIAGNOSIS — E876 Hypokalemia: Secondary | ICD-10-CM | POA: Insufficient documentation

## 2015-02-05 DIAGNOSIS — E46 Unspecified protein-calorie malnutrition: Secondary | ICD-10-CM

## 2015-02-05 DIAGNOSIS — R109 Unspecified abdominal pain: Secondary | ICD-10-CM

## 2015-02-05 DIAGNOSIS — R112 Nausea with vomiting, unspecified: Secondary | ICD-10-CM | POA: Insufficient documentation

## 2015-02-05 DIAGNOSIS — D649 Anemia, unspecified: Secondary | ICD-10-CM

## 2015-02-05 MED ORDER — DIPHENOXYLATE-ATROPINE 2.5-0.025 MG PO TABS: ORAL_TABLET | ORAL | Status: DC

## 2015-02-05 NOTE — Progress Notes (Signed)
Chemo teaching done regarding Irinotecan. Patient is unsure at this point if she will take Folfiri + Avastin or just Folfiri alone. Patient will let us know on Monday in which consent will need to be obtained by treatment room nurse providing care to patient. Magic Mouthwash called in for patient and Lomotil rx given to patient today so patient will have on hand if she needs it. Patient is allowed to use Imodium alone, Lomotil alone, or Imodium + Lomotil together for diarrhea/loose stools. If taking Imodium and Lomotil together patient can take them at same time or stagger the drugs throughout the day per Dr. [name].

## 2015-02-05 NOTE — Addendum Note (Signed)
Addended by: Gerhard Perches on: 02/05/2015 11:53 AM   Modules accepted: Orders

## 2015-02-05 NOTE — Patient Instructions (Signed)
[place] at [facility] Discharge Instructions  RECOMMENDATIONS MADE BY THE CONSULTANT AND ANY TEST RESULTS WILL BE SENT TO YOUR REFERRING PHYSICIAN.  Exam and discussion by Dr. [name] CT scans show progression and we need to change therapy Lupita Raider will be teaching about your new regimen. Will get you scheduled for Monday.  Call with any issues or concerns. See Schedule.   Thank you for choosing [place] at [facility] to provide your oncology and hematology care.  To afford each patient quality time with our provider, please arrive at least 15 minutes before your scheduled appointment time.    You need to re-schedule your appointment should you arrive 10 or more minutes late.  We strive to give you quality time with our providers, and arriving late affects you and other patients whose appointments are after yours.  Also, if you no show three or more times for appointments you may be dismissed from the clinic at the providers discretion.     Again, thank you for choosing [facility].  Our hope is that these requests will decrease the amount of time that you wait before being seen by our physicians.       _____________________________________________________________  Should you have questions after your visit to [facility], please contact our office at [phone] between the hours of 8:30 a.m. and 4:30 p.m.  Voicemails left after 4:30 p.m. will not be returned until the following business day.  For prescription refill requests, have your pharmacy contact our office.

## 2015-02-05 NOTE — Progress Notes (Signed)
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 Diffuse liver metastases   07/02/2014 Imaging Changes consistent with the known history of colon carcinoma with metastatic disease involving the liver and peritoneum. Recent stent placement within the colonic lesion. Some fecal material is noted within the proximal portion of the stent which a...   07/05/2014 Pathology Results Liver, needle/core biopsy, left lobe - METASTATIC ADENOCARCINOMA   07/19/2014 - 08/16/2014 Chemotherapy FOLFOX   07/23/2014 Imaging Similar findings of advanced stage IV metastatic colon cancer with perhaps slight interval progression of disease in the liver.   08/25/2014 Treatment Plan Change Chemotherapy held due to hospitalization   08/25/2014 - 09/21/2014 Hospital Admission SBO   08/26/2014 Imaging High-grade distal small bowel obstruction, likely from peritoneal metastatic disease.  Extensive hepatic metastatic disease. The largest metastasis in the left lobe has decreased from 07/23/2014.   08/31/2014 Surgery Colon, segmental resection for tumor, rectum - INVASIVE ADENOCARCINOMA, MODERATELY DIFFERENTIATED - TUMOR INVADES THROUGH SEROSA, SEE COMMENT. - PERFORATION WITH VISIBLE STENT. - LYMPHOVASCULAR AND PERINEURAL INVASION IS PRESENT. - DISTAL AND PROX...   09/15/2014 Imaging No bowel obstruction suspected status post multifocal small and large bowel resection with both primary reanastomoses and descending colostomy. Patulous appearance of colon in the right abdomen might reflect focal ileus.   11/06/2014 - 01/17/2015 Chemotherapy FOLFOX   11/28/2014 - 11/30/2014 Hospital Admission 1.   Nausea and vomiting 2.     Constipation    01/18/2015 - 01/22/2015 Hospital Admission Nausea and vomiting, UTI (lower urinary tract infection)   01/29/2015 Progression CT CAP- Progressive hepatic metastatic disease. New and enlarging heterogeneous nodules and masses in the liver measure up to 5.2 x 6.7 cm (image 56), previously 3.3 x 5.1 cm on 09/15/2014.    CURRENT THERAPY: FOLFOX/AVASTIN  INTERVAL HISTORY: AAYAT Morrison 59 y.o. 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 Michael Boston on 08/31/2014 when she was admitted with SBO.   Sydney Morrison confirms that they've been out doing things, to the [employer], went to [employer], stocked up on books, etc.  Sydney Morrison is here today with her husband. She is feeling much better without chemotherapy, with normal bowels. She has been getting out of the house by attending church and going to the [employer]. She has been eating well. Her weight remains unchanged although she insists she weights 2 pounds more at home.  She returns today to discuss her most recent CT chest, abdomen, pelvis scans on 01/29/15. These scans revealed progression in the liver.   She notes the first two rounds of chemotherapy alleviated her liver pain consistently. However, when chemotherapy treatment was given with avastin her liver pain returned. The liver pain is now intermittent. She does not think she wants to take AVASTIN any longer. She also has questions about RFA for her liver disease. She notes that she does not like to read on the intranet because "her prognosis is out there and she tries to stay positive."  She reports that her eyes are bloodshot in the  mornings. Her back is feeling better, noting that it is still painful. Denies blood in stool.   Past Medical History  Diagnosis Date   . Crohn's disease ([place])   . Chronic headache   . Cancer ([place])   . Nausea and vomiting 08/25/2014  . 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 [id number]; Chronic blood loss anemia; Antineoplastic chemotherapy induced anemia; Dehydration; Chemotherapy-induced neuropathy ([place]); Nausea and vomiting; SBO (small bowel obstruction) ([place]); Atrial flutter ([place]); Goals of care, counseling/discussion; Screen for STD (sexually transmitted disease); Pressure ulcer, stage 1; Postoperative fever; Leucocytosis; Protein-calorie malnutrition, severe ([place]); Tobacco abuse; Encounter for palliative care; Hyponatremia; Intolerance, food ([place]); Intractable nausea and vomiting; Constipation; Anemia of chronic disease; Thrombocytopenia ([place]); and UTI (lower urinary tract infection) on her problem list.     is allergic to avelox; codeine; dextromethorphan; erythromycin; penicillins; percocet; reglan; zofran; and suprep.  Current Outpatient Prescriptions on File Prior to Visit  Medication Sig Dispense Refill  . acetaminophen (TYLENOL) 325 MG tablet Take 325 mg by mouth every 6 (six) hours as needed for fever or headache (headahce).     . ALPRAZolam (XANAX) 0.5 MG tablet Take 0.125-0.5 mg by mouth at bedtime as needed for sleep.     . cromolyn (NASALCROM) 5.2 MG/ACT nasal spray Place 1 spray into both nostrils at bedtime.    . Cyanocobalamin (VITAMIN B-12 CR) 1500 MCG TBCR Take 1 tablet by mouth daily.    Marland Kitchen ENSURE (ENSURE) Take 237 mLs by mouth 2 (two) times daily with a meal.    . GENERLAC 10 GM/15ML SOLN Take 10 g by mouth daily as needed (for constipation).     Marland Kitchen lidocaine-prilocaine (EMLA) cream Apply 1 application topically as needed. Apply to portacath site 1-2 hours prior to use 30 g 3  . Multiple Vitamins-Minerals (MULTIVITAMIN WITH MINERALS) tablet Take 1 tablet by mouth daily.    . nicotine (NICODERM CQ - DOSED IN MG/24 HR) 7 mg/24hr patch Place 7 mg onto the skin daily.    . Bevacizumab  (AVASTIN IV) Inject into the vein every 14 (fourteen) days.    Marland Kitchen dexamethasone (DECADRON) 4 MG tablet Take 1 tablet (4 mg total) by mouth 2 (two) times daily. For 2 days. Begin day of pump disconnect. (Patient not taking: Reported on 02/05/2015) 8 tablet 1  . Fluorouracil (ADRUCIL IV) Inject into the vein every 14 (fourteen) days.    Marland Kitchen LEUCOVORIN CALCIUM IV Inject into the vein. Every 14 days    . leucovorin in dextrose 5 % 250 mL Inject into the vein every 14 (fourteen) days.    . Potassium Bicarb-Citric Acid 20 MEQ TBEF Take 1 tablet (20 mEq total) by mouth daily. Dissolve in ginger ale (Patient not taking: Reported on 02/05/2015) 30 each 1  . prochlorperazine (COMPAZINE) 25 MG suppository Place 1 suppository (25 mg total) rectally every 8 (eight) hours as needed for nausea or vomiting. (Patient not taking: Reported on 02/05/2015) 12 suppository 0  . promethazine (PHENERGAN) 12.5 MG tablet Take 1 tablet (12.5 mg total) by mouth every 6 (six) hours as needed for nausea. (Patient not taking: Reported on 02/05/2015) 30 tablet 0   No current facility-administered medications on file prior to visit.    Past Surgical History  Procedure Laterality Date  . Breast surgery    . Uterine fibroid surgery    . Colonoscopy w/ biopsies  06/29/2014    DR HUNG  . Flexible sigmoidoscopy N/A 06/30/2014    Procedure: FLEXIBLE  SIGMOIDOSCOPY;  Surgeon: Carol Ada, MD;  Location: [facility];  Service: Endoscopy;  Laterality: N/A;  . Colonic stent placement N/A 06/30/2014    Procedure: COLONIC STENT PLACEMENT;  Surgeon: Carol Ada, MD;  Location: [facility] ENDOSCOPY;  Service: Endoscopy;  Laterality: N/A;  with fluro   . Portacath placement N/A 07/04/2014    Procedure: POWER PORT PLACEMENT;  Surgeon: Alphonsa Overall, MD;  Location: [facility];  Service: General;  Laterality: N/A;  . Laparotomy N/A 08/31/2014    Procedure: EXPLORATORY LAPAROTOMY;  Surgeon: Michael Boston, MD;  Location: [facility];  Service: General;  Laterality: N/A;  .  Bowel resection N/A 08/31/2014    Procedure: SMALL BOWEL RESECTION;  Surgeon: Michael Boston, MD;  Location: [facility];  Service: General;  Laterality: N/A;  . Colostomy N/A 08/31/2014    Procedure: low anterior resection with COLOSTOMY;  Surgeon: Michael Boston, MD;  Location: [facility];  Service: General;  Laterality: N/A;  . Appendectomy  08/31/2014    Procedure: APPENDECTOMY;  Surgeon: Michael Boston, MD;  Location: [facility];  Service: General;;  . Supracervical abdominal hysterectomy  08/31/2014    Procedure: HYSTERECTOMY SUPRACERVICAL ABDOMINAL BILATERAL TUBES AND OVARIES;  Surgeon: Michael Boston, MD;  Location: [facility];  Service: General;;  . Transverse colon resection  08/31/2014    Procedure: TRANSVERSE COLON RESECTION;  Surgeon: Michael Boston, MD;  Location: [facility];  Service: General;;  . Debulking  08/31/2014    Procedure: DEBULKING OF PERITONEUM;  Surgeon: Michael Boston, MD;  Location: [facility];  Service: General;;    Denies any headaches, dizziness, double vision, fevers, chills, night sweats, nausea, vomiting, diarrhea, constipation, chest pain, heart palpitations, shortness of breath, blood in stool, black tarry stool, urinary pain, urinary burning, urinary frequency, hematuria.     Notes bloodshot eyes in the morning.  14 point review of systems was performed and is negative except as detailed under history of present illness and above   PHYSICAL EXAMINATION ECOG PERFORMANCE STATUS: 2 - Symptomatic, <50% confined to bed  Filed Vitals:   02/05/15 0824  BP: 128/70  Pulse: 75  Temp: 98.1 F (36.7 C)  Resp: 16    Filed Weights   02/05/15 0824  Weight: 96 lb 12.8 oz (43.908 kg)    GENERAL:alert, no distress, cachectic, cooperative, smiling and accompanied by her husband, John. Onto the exam table without difficulty SKIN: skin color, texture, turgor are normal HEAD: Normocephalic, No masses, lesions, tenderness or abnormalities EYES: normal, PERRLA, EOMI, Conjunctiva are pink and non-injected EARS:  External ears normal OROPHARYNX:lips, buccal mucosa, and tongue normal and mucous membranes are moist  NECK: supple, trachea midline LYMPH:  not examined BREAST:not examined LUNGS: clear to auscultation and percussion HEART: regular rate & rhythm, no murmurs, no gallops, S1 normal and S2 normal ABDOMEN:abdomen soft, non-tender, normal bowel sounds and ostomy noted.  Small stomal hernia. Very thin BACK: Back symmetric, no curvature, sacral area with bandage, erythema noted EXTREMITIES:less then 2 second capillary refill, no joint deformities, effusion, or inflammation, no skin discoloration, no cyanosis  NEURO: alert & oriented x 3 with fluent speech, no focal motor/sensory deficits   LABORATORY DATA: I have reviewed the data below as listed.  CBC    Component Value Date/Time   WBC 7.5 01/19/2015 0641   WBC 5.5 08/16/2014 1037   RBC 3.71* 01/19/2015 0641   RBC 2.71* 09/12/2014 0515   RBC 3.61* 08/16/2014 1037   HGB 10.8* 01/19/2015 0641   HGB 9.6* 08/16/2014 1037   HCT 33.7* 01/19/2015  0641   HCT 30.9* 08/16/2014 1037   PLT 164 01/19/2015 0641   PLT 167 08/16/2014 1037   MCV 90.8 01/19/2015 0641   MCV 85.6 08/16/2014 1037   MCH 29.1 01/19/2015 0641   MCH 26.6 08/16/2014 1037   MCHC 32.0 01/19/2015 0641   MCHC 31.1* 08/16/2014 1037   RDW 22.1* 01/19/2015 0641   RDW 16.1* 08/16/2014 1037   LYMPHSABS 0.6* 01/18/2015 1340   LYMPHSABS 1.2 08/16/2014 1037   MONOABS 1.1* 01/18/2015 1340   MONOABS 0.9 08/16/2014 1037   EOSABS 0.0 01/18/2015 1340   EOSABS 0.2 08/16/2014 1037   BASOSABS 0.0 01/18/2015 1340   BASOSABS 0.0 08/16/2014 1037      Chemistry      Component Value Date/Time   NA 140 01/19/2015 0641   NA 140 08/16/2014 1037   K 4.0 01/19/2015 0641   K 4.2 08/16/2014 1037   CL 99* 01/19/2015 0641   CO2 31 01/19/2015 0641   CO2 28 08/16/2014 1037   BUN 37* 01/19/2015 0641   BUN 23.1 08/16/2014 1037   CREATININE 0.69 01/19/2015 0641   CREATININE 0.7 08/16/2014  1037      Component Value Date/Time   CALCIUM 9.2 01/19/2015 0641   CALCIUM 9.5 08/16/2014 1037   ALKPHOS 191* 01/19/2015 0641   ALKPHOS 143 08/16/2014 1037   AST 55* 01/19/2015 0641   AST 21 08/16/2014 1037   ALT 59* 01/19/2015 0641   ALT 15 08/16/2014 1037   BILITOT 0.7 01/19/2015 0641   BILITOT 0.28 08/16/2014 1037     Lab Results  Component Value Date   CEA 20.2* 01/03/2015    PATHOLOGY:  Diagnosis 1. Soft tissue mass, biopsy, peritoneal mass dome of bladder - METASTATIC ADENOCARCINOMA. 2. Appendix, Incidental - METASTATIC ADENOCARCINOMA. 3. Small intestine, resection, ileum - METASTATIC ADENOCARCINOMA. - RESECTION MARGINS ARE NEGATIVE. 4. Colon, segmental resection, with omentum, transverse - METASTATIC ADENOCARCINOMA INVOLVING COLON AND OMENTUM. - RESECTION MARGINS ARE NEGATIVE. 5. Soft tissue, biopsy, right pelvic sidewall nodule - METASTATIC ADENOCARCINOMA. 6. Soft tissue mass, biopsy, anterior peritoneum mass near dome - METASTATIC ADENOCARCINOMA. 7. Colon, segmental resection for tumor, rectum - INVASIVE ADENOCARCINOMA, MODERATELY DIFFERENTIATED - TUMOR INVADES THROUGH SEROSA, SEE COMMENT. - PERFORATION WITH VISIBLE STENT. - LYMPHOVASCULAR AND PERINEURAL INVASION IS PRESENT. - DISTAL AND PROXIMAL RESECTION MARGINS ARE NEGATIVE, SEE COMMENT FOR RADIAL MARGIN. - METASTATIC CARCINOMA IN FOUR OF FOURTEEN LYMPH NODES (4/14). - SEROSAL METASTATIC NODULES AND SATELLITE NODULES PRESENT. - SEE ONCOLOGY TABLE AND COMMENT.    RADIOLOGY: CLINICAL DATA: Colon cancer, surgery and chemotherapy, liver metastases, Crohn disease.  EXAM: CT CHEST, ABDOMEN, AND PELVIS WITH CONTRAST  TECHNIQUE: Multidetector CT imaging of the chest, abdomen and pelvis was performed following the standard protocol during bolus administration of intravenous contrast.  CONTRAST: 158m OMNIPAQUE IOHEXOL 300 MG/ML SOLN  COMPARISON: CT abdomen pelvis 09/15/2014 and CT chest  abdomen pelvis 06/29/2014.  FINDINGS: CT CHEST FINDINGS  Mediastinum/Nodes: Aberrant right subclavian artery noted. No pathologically enlarged mediastinal, hilar or axillary lymph nodes. Heart size normal. No pericardial effusion.  Lungs/Pleura: Mild scarring in the left lower lobe. 5 mm subpleural nodule in the left lower lobe (image 29) is unchanged from 06/29/2014. Lungs are otherwise clear. No pleural fluid. Airway is unremarkable.  Musculoskeletal: No worrisome lytic or sclerotic lesions.  CT ABDOMEN PELVIS FINDINGS  Hepatobiliary: New and enlarging heterogeneous nodules and masses in the liver measure up to 5.2 x 6.7 cm (image 56), previously 3.3 x 5.1 cm on 09/15/2014. Gallbladder is  unremarkable. No biliary ductal dilatation.  Pancreas: Negative.  Spleen: Low-attenuation lesions in the spleen may be due to phase of contrast enhancement as they are not readily visualized on nephrographic phase imaging.  Adrenals/Urinary Tract: Adrenal glands and kidneys are unremarkable. Bladder is unremarkable.  Stomach/Bowel: Stomach and small bowel are unremarkable. Scattered postoperative changes in the colon with a descending left lower quadrant colostomy.  Vascular/Lymphatic: Atherosclerotic calcification of the arterial vasculature without abdominal aortic aneurysm. No pathologically enlarged lymph nodes.  Reproductive: Uterus may be atrophic. Ovaries are not well-visualized.  Other: Mild presacral edema/ thickening. No free fluid. Mesenteries and peritoneum are grossly unremarkable.  Musculoskeletal: No worrisome lytic or sclerotic lesions.  IMPRESSION: 1. Progressive hepatic metastatic disease. 2. Postoperative changes in the colon with a descending colostomy.   Electronically Signed  By: Lorin Picket M.D.  On: 01/29/2015 14:21  ASSESSMENT AND PLAN:  Stage IV CRC Cachexia Pressure wound -- well healing Anemia kras mutation  She has  evidence of progression based upon recent imaging. I have suspected progressive disease based upon her lack of weight gain and overall marginal physical activity. She is very determined to continue with treatment although on discussion today she does not know if she wants Avastin. She feels like Avastin has perhaps caused her "liver pain."  Her anemia is chronic we will continue to monitor this. I have encouraged ongoing improvement in her nutritional status.  I have recommended changing her to FOLFIRI. Mrs. Remund would like to begin the new chemotherapy regimen next Monday, 11/14. We will set up for teaching with [facility] about camptosar. She will receive a prescription for lomotil.   She does not need refills at this time.  All questions were answered. The patient knows to call the clinic with any problems, questions or concerns. We can certainly see the patient much sooner if necessary.   This document serves as a record of services personally performed by Ancil Linsey, MD. It was created on her behalf by Arlyce Harman, a trained medical scribe. The creation of this record is based on the scribe's personal observations and the provider's statements to them. This document has been checked and approved by the attending provider.  I have reviewed the above documentation for accuracy and completeness, and I agree with the above.  This note was electronically signed.  Kelby Fam. [name], MD

## 2015-02-12 ENCOUNTER — Encounter (HOSPITAL_BASED_OUTPATIENT_CLINIC_OR_DEPARTMENT_OTHER): Payer: Commercial Managed Care - HMO

## 2015-02-12 VITALS — BP 136/63 | HR 63 | Temp 98.3°F | Resp 16 | Wt 97.7 lb

## 2015-02-12 DIAGNOSIS — C786 Secondary malignant neoplasm of retroperitoneum and peritoneum: Secondary | ICD-10-CM | POA: Diagnosis not present

## 2015-02-12 DIAGNOSIS — C189 Malignant neoplasm of colon, unspecified: Secondary | ICD-10-CM

## 2015-02-12 DIAGNOSIS — C787 Secondary malignant neoplasm of liver and intrahepatic bile duct: Secondary | ICD-10-CM

## 2015-02-12 DIAGNOSIS — Z5111 Encounter for antineoplastic chemotherapy: Secondary | ICD-10-CM | POA: Diagnosis not present

## 2015-02-12 DIAGNOSIS — R112 Nausea with vomiting, unspecified: Secondary | ICD-10-CM | POA: Diagnosis present

## 2015-02-12 DIAGNOSIS — E876 Hypokalemia: Secondary | ICD-10-CM | POA: Diagnosis present

## 2015-02-12 LAB — CBC WITH DIFFERENTIAL/PLATELET
Basophils Absolute: 0 10*3/uL (ref 0.0–0.1)
Basophils Relative: 0 %
Eosinophils Absolute: 0.3 10*3/uL (ref 0.0–0.7)
Eosinophils Relative: 3 %
HEMATOCRIT: 34.4 % — AB (ref 36.0–46.0)
HEMOGLOBIN: 10.9 g/dL — AB (ref 12.0–15.0)
LYMPHS ABS: 2.2 10*3/uL (ref 0.7–4.0)
LYMPHS PCT: 24 %
MCH: 30.1 pg (ref 26.0–34.0)
MCHC: 31.7 g/dL (ref 30.0–36.0)
MCV: 95 fL (ref 78.0–100.0)
MONOS PCT: 18 %
Monocytes Absolute: 1.6 10*3/uL — ABNORMAL HIGH (ref 0.1–1.0)
NEUTROS ABS: 5.2 10*3/uL (ref 1.7–7.7)
NEUTROS PCT: 55 %
Platelets: 222 10*3/uL (ref 150–400)
RBC: 3.62 MIL/uL — AB (ref 3.87–5.11)
RDW: 19.5 % — ABNORMAL HIGH (ref 11.5–15.5)
WBC: 9.2 10*3/uL (ref 4.0–10.5)

## 2015-02-12 LAB — COMPREHENSIVE METABOLIC PANEL
ALK PHOS: 216 U/L — AB (ref 38–126)
ALT: 29 U/L (ref 14–54)
ANION GAP: 8 (ref 5–15)
AST: 39 U/L (ref 15–41)
Albumin: 3.7 g/dL (ref 3.5–5.0)
BILIRUBIN TOTAL: 0.3 mg/dL (ref 0.3–1.2)
BUN: 20 mg/dL (ref 6–20)
CALCIUM: 9.2 mg/dL (ref 8.9–10.3)
CO2: 28 mmol/L (ref 22–32)
CREATININE: 0.48 mg/dL (ref 0.44–1.00)
Chloride: 99 mmol/L — ABNORMAL LOW (ref 101–111)
GFR calc non Af Amer: >60 mL/min (ref >60)
Glucose, Bld: 80 mg/dL (ref 65–99)
Potassium: 4.2 mmol/L (ref 3.5–5.1)
Sodium: 135 mmol/L (ref 135–145)
TOTAL PROTEIN: 7.7 g/dL (ref 6.5–8.1)

## 2015-02-12 MED ORDER — SODIUM CHLORIDE 0.9 % IJ SOLN: 10.0000 mL | INTRAMUSCULAR | Status: DC | PRN

## 2015-02-12 MED ORDER — SODIUM CHLORIDE 0.9 % IV SOLN
Freq: Once | INTRAVENOUS | Status: AC
  Administered 2015-02-12: 11:00:00 via INTRAVENOUS

## 2015-02-12 MED ORDER — FLUOROURACIL CHEMO INJECTION 5 GM/100ML
2400.0000 mg/m2 | INTRAVENOUS | Status: AC
  Administered 2015-02-12: 3450 mg via INTRAVENOUS
  Filled 2015-02-12: qty 69

## 2015-02-12 MED ORDER — HEPARIN SOD (PORK) LOCK FLUSH 100 UNIT/ML IV SOLN
500.0000 [IU] | Freq: Once | INTRAVENOUS | Status: DC | PRN
Start: 2015-02-12 — End: 2015-08-20
  Filled 2015-02-12: qty 5

## 2015-02-12 MED ORDER — IRINOTECAN HCL CHEMO INJECTION 100 MG/5ML
180.0000 mg/m2 | Freq: Once | INTRAVENOUS | Status: AC
  Administered 2015-02-12: 260 mg via INTRAVENOUS
  Filled 2015-02-12: qty 4.33

## 2015-02-12 MED ORDER — SODIUM CHLORIDE 0.9 % IV SOLN
Freq: Once | INTRAVENOUS | Status: AC
  Administered 2015-02-12: 11:00:00 via INTRAVENOUS
  Filled 2015-02-12: qty 5

## 2015-02-12 MED ORDER — PALONOSETRON HCL INJECTION 0.25 MG/5ML
0.2500 mg | Freq: Once | INTRAVENOUS | Status: AC
  Administered 2015-02-12: 0.25 mg via INTRAVENOUS
  Filled 2015-02-12: qty 5

## 2015-02-12 MED ORDER — ATROPINE SULFATE 1 MG/ML IJ SOLN: 0.5000 mg | Freq: Once | INTRAMUSCULAR | Status: DC | PRN

## 2015-02-12 MED ORDER — LEUCOVORIN CALCIUM INJECTION 350 MG
400.0000 mg/m2 | Freq: Once | INTRAVENOUS | Status: AC
  Administered 2015-02-12: 576 mg via INTRAVENOUS
  Filled 2015-02-12: qty 28.8

## 2015-02-12 MED ORDER — PROMETHAZINE HCL 25 MG/ML IJ SOLN
12.5000 mg | INTRAMUSCULAR | Status: DC | PRN
  Filled 2015-02-12 (×2): qty 1

## 2015-02-12 NOTE — Patient Instructions (Signed)
[facility] Discharge Instructions for Patients Receiving Chemotherapy  Today you received the following chemotherapy agents: Irinotecan, Leucovorin, and 5FU.   Please call us with any concerns or changes.     If you develop nausea and vomiting, or diarrhea that is not controlled by your medication, call the clinic.  The clinic phone number is [phone]. Office hours are Monday-Friday 8:30am-5:00pm.  BELOW ARE SYMPTOMS THAT SHOULD BE REPORTED IMMEDIATELY:  *FEVER GREATER THAN 101.0 F  *CHILLS WITH OR WITHOUT FEVER  NAUSEA AND VOMITING THAT IS NOT CONTROLLED WITH YOUR NAUSEA MEDICATION  *UNUSUAL SHORTNESS OF BREATH  *UNUSUAL BRUISING OR BLEEDING  TENDERNESS IN MOUTH AND THROAT WITH OR WITHOUT PRESENCE OF ULCERS  *URINARY PROBLEMS  *BOWEL PROBLEMS  UNUSUAL RASH Items with * indicate a potential emergency and should be followed up as soon as possible. If you have an emergency after office hours please contact your primary care physician or go to the nearest emergency department.  Please call the clinic during office hours if you have any questions or concerns.   You may also contact the Patient Navigator at [phone] should you have any questions or need assistance in obtaining follow up care.

## 2015-02-12 NOTE — Progress Notes (Unsigned)
Patient tolerated infusion well.  VSS post infusion.  Patient decided not to do the Avastin, consent signed reflecting this decision.  Robynn Pane, PA aware.

## 2015-02-12 NOTE — Progress Notes (Unsigned)
Patient states that she has sweating, feels hot and nauseous. She does not want nausea med. States they just do not work for me. Vitals stable.  Pump connected and patient d/c ed to home. It is her desire to take lorazepam on arrival home instead of taking something in the clinic. She is aware if she has vomiting to contact us tomorrow.

## 2015-02-13 ENCOUNTER — Telehealth (HOSPITAL_COMMUNITY): Payer: Self-pay | Admitting: *Deleted

## 2015-02-13 LAB — CEA: CEA: 17.4 ng/mL — ABNORMAL HIGH (ref 0.0–4.7)

## 2015-02-13 NOTE — Telephone Encounter (Signed)
Patient states she is feeling "yucky", no vomiting, she has gotten down 2 ensure clear and half a bottle of water. Will continue to push fluids to the best of her ability.

## 2015-02-13 NOTE — Telephone Encounter (Signed)
24 hr F/U after first tx with irinotecan. No answer. I left message for her to call us to let us know how she is and to let her know that CEA continues to decline.

## 2015-02-14 ENCOUNTER — Other Ambulatory Visit (HOSPITAL_COMMUNITY): Payer: Self-pay | Admitting: Oncology

## 2015-02-14 ENCOUNTER — Encounter (HOSPITAL_BASED_OUTPATIENT_CLINIC_OR_DEPARTMENT_OTHER): Payer: Commercial Managed Care - HMO

## 2015-02-14 ENCOUNTER — Encounter (HOSPITAL_COMMUNITY): Payer: Self-pay

## 2015-02-14 VITALS — BP 127/74 | HR 71 | Temp 97.8°F | Resp 18

## 2015-02-14 DIAGNOSIS — C189 Malignant neoplasm of colon, unspecified: Secondary | ICD-10-CM

## 2015-02-14 DIAGNOSIS — Z452 Encounter for adjustment and management of vascular access device: Secondary | ICD-10-CM

## 2015-02-14 DIAGNOSIS — C787 Secondary malignant neoplasm of liver and intrahepatic bile duct: Secondary | ICD-10-CM | POA: Diagnosis not present

## 2015-02-14 DIAGNOSIS — R112 Nausea with vomiting, unspecified: Secondary | ICD-10-CM

## 2015-02-14 MED ORDER — DEXAMETHASONE 4 MG PO TABS: 4.0000 mg | ORAL_TABLET | Freq: Two times a day (BID) | ORAL | Status: DC

## 2015-02-14 MED ORDER — SODIUM CHLORIDE 0.9 % IJ SOLN
10.0000 mL | INTRAMUSCULAR | Status: DC | PRN
  Administered 2015-02-14: 10 mL
  Filled 2015-02-14: qty 10

## 2015-02-14 MED ORDER — HEPARIN SOD (PORK) LOCK FLUSH 100 UNIT/ML IV SOLN
500.0000 [IU] | Freq: Once | INTRAVENOUS | Status: AC | PRN
  Administered 2015-02-14: 500 [IU]

## 2015-02-14 NOTE — Progress Notes (Signed)
Sydney Morrison presented for Portacath de-access and flush.  Proper placement of portacath confirmed by CXR.  Portacath located left chest wall accessed with  H 20 needle.  Good blood return present. Portacath flushed with 20ml NS and 500U/5ml Heparin and needle removed intact.  Procedure tolerated well and without incident.  Pump removed   

## 2015-02-14 NOTE — Patient Instructions (Signed)
[facility] at [facility] Discharge Instructions  RECOMMENDATIONS MADE BY THE CONSULTANT AND ANY TEST RESULTS WILL BE SENT TO YOUR REFERRING PHYSICIAN.  Port flushed and pump removed Follow up as scheduled Please call the clinic if you have any questions or concerns  Thank you for choosing [facility] at [facility] to provide your oncology and hematology care.  To afford each patient quality time with our provider, please arrive at least 15 minutes before your scheduled appointment time.    You need to re-schedule your appointment should you arrive 10 or more minutes late.  We strive to give you quality time with our providers, and arriving late affects you and other patients whose appointments are after yours.  Also, if you no show three or more times for appointments you may be dismissed from the clinic at the providers discretion.     Again, thank you for choosing [facility].  Our hope is that these requests will decrease the amount of time that you wait before being seen by our physicians.       _____________________________________________________________  Should you have questions after your visit to [facility], please contact our office at [phone] between the hours of 8:30 a.m. and 4:30 p.m.  Voicemails left after 4:30 p.m. will not be returned until the following business day.  For prescription refill requests, have your pharmacy contact our office.    

## 2015-02-18 ENCOUNTER — Emergency Department (HOSPITAL_COMMUNITY)
Admission: EM | Admit: 2015-02-18 | Discharge: 2015-02-19 | Disposition: A | Discharge status: Home / Self Care | Payer: Commercial Managed Care - HMO | Attending: Emergency Medicine | Admitting: Emergency Medicine

## 2015-02-18 ENCOUNTER — Encounter (HOSPITAL_COMMUNITY): Payer: Self-pay | Admitting: *Deleted

## 2015-02-18 ENCOUNTER — Emergency Department (HOSPITAL_COMMUNITY): Payer: Commercial Managed Care - HMO

## 2015-02-18 DIAGNOSIS — J3489 Other specified disorders of nose and nasal sinuses: Secondary | ICD-10-CM | POA: Insufficient documentation

## 2015-02-18 DIAGNOSIS — R197 Diarrhea, unspecified: Secondary | ICD-10-CM | POA: Insufficient documentation

## 2015-02-18 DIAGNOSIS — R110 Nausea: Secondary | ICD-10-CM | POA: Insufficient documentation

## 2015-02-18 DIAGNOSIS — R067 Sneezing: Secondary | ICD-10-CM | POA: Insufficient documentation

## 2015-02-18 DIAGNOSIS — R5082 Postprocedural fever: Secondary | ICD-10-CM | POA: Insufficient documentation

## 2015-02-18 DIAGNOSIS — R509 Fever, unspecified: Secondary | ICD-10-CM

## 2015-02-18 DIAGNOSIS — Z87891 Personal history of nicotine dependence: Secondary | ICD-10-CM | POA: Insufficient documentation

## 2015-02-18 DIAGNOSIS — Z880 Allergy status to penicillin: Secondary | ICD-10-CM | POA: Insufficient documentation

## 2015-02-18 DIAGNOSIS — C189 Malignant neoplasm of colon, unspecified: Secondary | ICD-10-CM | POA: Insufficient documentation

## 2015-02-18 DIAGNOSIS — Z79899 Other long term (current) drug therapy: Secondary | ICD-10-CM | POA: Insufficient documentation

## 2015-02-18 DIAGNOSIS — Z8719 Personal history of other diseases of the digestive system: Secondary | ICD-10-CM | POA: Insufficient documentation

## 2015-02-18 DIAGNOSIS — Z8709 Personal history of other diseases of the respiratory system: Secondary | ICD-10-CM | POA: Insufficient documentation

## 2015-02-18 DIAGNOSIS — R636 Underweight: Secondary | ICD-10-CM | POA: Insufficient documentation

## 2015-02-18 DIAGNOSIS — G8929 Other chronic pain: Secondary | ICD-10-CM | POA: Insufficient documentation

## 2015-02-18 DIAGNOSIS — R1084 Generalized abdominal pain: Secondary | ICD-10-CM | POA: Insufficient documentation

## 2015-02-18 LAB — URINALYSIS, ROUTINE W REFLEX MICROSCOPIC
Bilirubin Urine: NEGATIVE
Glucose, UA: NEGATIVE mg/dL
Hgb urine dipstick: NEGATIVE
Ketones, ur: NEGATIVE mg/dL
LEUKOCYTES UA: NEGATIVE
NITRITE: NEGATIVE
PH: 7 (ref 5.0–8.0)
Protein, ur: NEGATIVE mg/dL
SPECIFIC GRAVITY, URINE: 1.015 (ref 1.005–1.030)

## 2015-02-18 MED ORDER — SODIUM CHLORIDE 0.9 % IV BOLUS (SEPSIS)
1000.0000 mL | Freq: Once | INTRAVENOUS | Status: AC
  Administered 2015-02-18: 1000 mL via INTRAVENOUS

## 2015-02-18 NOTE — ED Notes (Signed)
Pt c/o fever x 4 days. Last chemo treatment finished on Wednesday.

## 2015-02-18 NOTE — ED Provider Notes (Signed)
CSN: [id number]     Arrival date & time 02/18/15  2229 History  By signing my name below, I, Sydney Morrison, attest that this documentation has been prepared under the direction and in the presence of [name], MD at 23:02. Electronically Signed: Irene Morrison, ED Scribe. 02/18/2015. 11:03 PM.   Chief Complaint  Patient presents with  . chemo card fever    The history is provided by the patient. No language interpreter was used.  HPI Comments: Sydney Morrison is a 59 y.o. Female with a hx of Crohn's disease and colon cancer with mets to liver,who presents to the Emergency Department complaining of gradually worsening fever tmax 101 F onset today. She has been having temps to 99.5 the past several days after her chemotherapy.  Pt finished her last chemo treatment on Wednesday, Nov 16. Pt was diagnosed with colonvcancer June 29, 2014 and has had over 10 rounds of chemo since her diagnosis. She had 3 rounds, then surgery, and after surgery around 6 rounds.  She reports associated intermittent chills, sneezing, rhinorrhea with mild green drainage that resolved after nasal "douching", nausea, generalized cramping abdominal pain, and diarrhea. She reports taking Tylenol for her fever the past several days, but states she didn't take any tonight.  She states that nasal douching has helped to relieve her green rhinorrhea and now it is clear again. She reports a hx of sinus problems that gets worse with chemotherapy.  Pt states that she took Imodium to relief for her diarrhea. She states that she has been having difficulty tolerating liquids. She denies vomiting. Pt reports that she is a former smoker and uses a Nicotine patch. Pt has a port placed on her chest.   PCP: [name], MD Oncology Dr [name]  Past Medical History  Diagnosis Date  . Crohn's disease ([place])   . Chronic headache   . Cancer ([place])   . Nausea and vomiting 08/25/2014  . Sinus infection    Past Surgical History  Procedure Laterality  Date  . Breast surgery    . Uterine fibroid surgery    . Colonoscopy w/ biopsies  06/29/2014    DR HUNG  . Flexible sigmoidoscopy N/A 06/30/2014    Procedure: FLEXIBLE SIGMOIDOSCOPY;  Surgeon: Carol Ada, MD;  [facility];  Service: Endoscopy;  Laterality: N/A;  . Colonic stent placement N/A 06/30/2014    Procedure: COLONIC STENT PLACEMENT;  Surgeon: Carol Ada, MD;  Location: [facility] ENDOSCOPY;  Service: Endoscopy;  Laterality: N/A;  with fluro   . Portacath placement N/A 07/04/2014    Procedure: POWER PORT PLACEMENT;  Surgeon: Alphonsa Overall, MD;  Location: [place];  Service: General;  Laterality: N/A;  . Laparotomy N/A 08/31/2014    Procedure: EXPLORATORY LAPAROTOMY;  Surgeon: Michael Boston, MD;  Location: [facility];  Service: General;  Laterality: N/A;  . Bowel resection N/A 08/31/2014    Procedure: SMALL BOWEL RESECTION;  Surgeon: Michael Boston, MD;  Location: [facility];  Service: General;  Laterality: N/A;  . Colostomy N/A 08/31/2014    Procedure: low anterior resection with COLOSTOMY;  Surgeon: Michael Boston, MD;  Location: [facility];  Service: General;  Laterality: N/A;  . Appendectomy  08/31/2014    Procedure: APPENDECTOMY;  Surgeon: Michael Boston, MD;  Location: [facility];  Service: General;;  . Supracervical abdominal hysterectomy  08/31/2014    Procedure: HYSTERECTOMY SUPRACERVICAL ABDOMINAL BILATERAL TUBES AND OVARIES;  Surgeon: Michael Boston, MD;  Location: [facility];  Service: General;;  . Transverse colon resection  08/31/2014    Procedure: TRANSVERSE COLON RESECTION;  Surgeon: Michael Boston, MD;  Location: [facility];  Service: General;;  . Debulking  08/31/2014    Procedure: DEBULKING OF PERITONEUM;  Surgeon: Michael Boston, MD;  Location: [facility];  Service: General;;   Family History  Problem Relation Age of Onset  . Hypertension Mother   . Cancer Father    Social History  Substance Use Topics  . Smoking status: Former Smoker -- 0.50 packs/day for 40 years    Types: Cigarettes    Quit date: 06/25/2014   . Smokeless tobacco: Never Used  . Alcohol Use: No   Live with spouse  OB History    Gravida Para Term Preterm AB TAB SAB Ectopic Multiple Living   3 2 2  1  1         Review of Systems  Constitutional: Positive for fever and chills.  HENT: Positive for rhinorrhea and sneezing.   Gastrointestinal: Positive for nausea, abdominal pain and diarrhea. Negative for vomiting.  All other systems reviewed and are negative.   Allergies  Avelox; Codeine; Dextromethorphan; Erythromycin; Penicillins; Percocet; Reglan; Zofran; Red dye; and Suprep  Home Medications   Prior to Admission medications   Medication Sig Start Date End Date Taking? Authorizing Provider  acetaminophen (TYLENOL) 325 MG tablet Take 325 mg by mouth every 6 (six) hours as needed for fever or headache (headahce).    Yes Historical Provider, MD  ALPRAZolam Duanne Moron) 0.5 MG tablet Take 0.125-0.5 mg by mouth at bedtime as needed for sleep.  09/17/14  Yes Historical Provider, MD  cromolyn (NASALCROM) 5.2 MG/ACT nasal spray Place 1 spray into both nostrils at bedtime.   Yes Historical Provider, MD  Cyanocobalamin (VITAMIN B-12 CR) 1500 MCG TBCR Take 1 tablet by mouth daily.   Yes Historical Provider, MD  ENSURE (ENSURE) Take 237 mLs by mouth 2 (two) times daily with a meal.   Yes Historical Provider, MD  Frankincense OIL Apply 1 application topically daily.   Yes Historical Provider, MD  loperamide (IMODIUM A-D) 2 MG tablet Take 2 mg by mouth 4 (four) times daily as needed for diarrhea or loose stools.   Yes Historical Provider, MD  Multiple Vitamins-Minerals (MULTIVITAMIN WITH MINERALS) tablet Take 1 tablet by mouth daily.   Yes Historical Provider, MD  nicotine (NICODERM CQ - DOSED IN MG/24 HR) 7 mg/24hr patch Place 7 mg onto the skin daily.   Yes Historical Provider, MD  dexamethasone (DECADRON) 4 MG tablet Take 1 tablet (4 mg total) by mouth 2 (two) times daily. For 2 days. Begin day of pump disconnect. 02/14/15   Baird Cancer, PA-C  diphenoxylate-atropine (LOMOTIL) 2.5-0.025 MG tablet May take 1-2 tablets four times a day as needed for loose stools. 02/05/15   Baird Cancer, PA-C  Fluorouracil (ADRUCIL IV) Inject into the vein every 14 (fourteen) days.    Historical Provider, MD  GENERLAC 10 GM/15ML SOLN Take 10 g by mouth daily as needed (for constipation).  11/30/14   Historical Provider, MD  LEUCOVORIN CALCIUM IV Inject into the vein. Every 14 days    Historical Provider, MD  lidocaine-prilocaine (EMLA) cream Apply 1 application topically as needed. Apply to portacath site 1-2 hours prior to use 07/14/14   Owens Shark, NP  Potassium Bicarb-Citric Acid 20 MEQ TBEF Take 1 tablet (20 mEq total) by mouth daily. Dissolve in ginger ale Patient not taking: Reported on 02/05/2015 11/20/14   Patrici Ranks, MD  prochlorperazine (COMPAZINE) 25  MG suppository Place 1 suppository (25 mg total) rectally every 8 (eight) hours as needed for nausea or vomiting. Patient not taking: Reported on 02/05/2015 01/22/15   Erline Hau, MD  promethazine (PHENERGAN) 12.5 MG tablet Take 1 tablet (12.5 mg total) by mouth every 6 (six) hours as needed for nausea. Patient not taking: Reported on 02/05/2015 01/22/15   Erline Hau, MD   BP 135/74 mmHg  Pulse 73  Temp(Src) 98.8 F (37.1 C) (Oral)  Resp 20  Ht 5\' 7"  (1.702 m)  Wt 97 lb (43.999 kg)  BMI 15.19 kg/m2  SpO2 99%  Vital signs normal   Physical Exam  Constitutional: She is oriented to person, place, and time.  Non-toxic appearance. She does not appear ill. No distress.  Thin and underweight  HENT:  Head: Normocephalic and atraumatic.  Right Ear: External ear normal.  Left Ear: External ear normal.  Nose: Nose normal. No mucosal edema or rhinorrhea.  Mouth/Throat: Oropharynx is clear and moist and mucous membranes are normal. No dental abscesses or uvula swelling.  Eyes: Conjunctivae and EOM are normal. Pupils are equal, round, and reactive  to light.  Neck: Normal range of motion and full passive range of motion without pain. Neck supple.  Cardiovascular: Normal rate, regular rhythm and normal heart sounds.  Exam reveals no gallop and no friction rub.   No murmur heard. Pulmonary/Chest: Effort normal and breath sounds normal. No respiratory distress. She has no wheezes. She has no rhonchi. She has no rales. She exhibits no tenderness and no crepitus.  Abdominal: Soft. Normal appearance and bowel sounds are normal. She exhibits no distension. There is no tenderness. There is no rebound and no guarding.  Musculoskeletal: Normal range of motion. She exhibits no edema or tenderness.  Moves all extremities well.   Neurological: She is alert and oriented to person, place, and time. She has normal strength. No cranial nerve deficit.  Skin: Skin is warm, dry and intact. No rash noted. No erythema. No pallor.  Psychiatric: She has a normal mood and affect. Her speech is normal and behavior is normal. Her mood appears not anxious.  Nursing note and vitals reviewed.   ED Course  Procedures (including critical care time)  Medications  sodium chloride 0.9 % bolus 1,000 mL (0 mLs Intravenous Stopped 02/19/15 0030)   DIAGNOSTIC STUDIES: Oxygen Saturation is 99% on RA, normal by my interpretation.    COORDINATION OF CARE: 11:11 PM-Discussed treatment plan which includes labs, x-ray, and IV fluids with pt at bedside and pt agreed to plan. Patient is presumed neutropenic. She was placed on respiratory precautions. Patient was cultured both blood and urine and had chest x-ray done. She had no other source of infection.   01:20 D/W Dr [name], discussed test results, states she can be discharged, if she wants meds for sinus that would be okay.  Patient and husband given her test results and Dr Gladys Damme recommendation that she be discharged.  Review of patient's chart shows her chemotherapy is Irinotecan, she had refused the avastin this time.    Labs Review  Results for orders placed or performed during the hospital encounter of 02/18/15  Culture, blood (routine x 2)  Result Value Ref Range   Specimen Description RIGHT ANTECUBITAL    Special Requests      BOTTLES DRAWN AEROBIC AND ANAEROBIC [place] DRAWN BY RN   Culture PENDING    Report Status PENDING   Culture, blood (routine x 2)  Result  Value Ref Range   Specimen Description BLOOD RIGHT ARM    Special Requests BOTTLES DRAWN AEROBIC ONLY [place]    Culture PENDING    Report Status PENDING   Comprehensive metabolic panel  Result Value Ref Range   Sodium 135 135 - 145 mmol/L   Potassium 3.6 3.5 - 5.1 mmol/L   Chloride 98 (L) 101 - 111 mmol/L   CO2 29 22 - 32 mmol/L   Glucose, Bld 113 (H) 65 - 99 mg/dL   BUN 17 6 - 20 mg/dL   Creatinine, Ser 0.49 0.44 - 1.00 mg/dL   Calcium 9.1 8.9 - 10.3 mg/dL   Total Protein 7.3 6.5 - 8.1 g/dL   Albumin 3.4 (L) 3.5 - 5.0 g/dL   AST 23 15 - 41 U/L   ALT 23 14 - 54 U/L   Alkaline Phosphatase 177 (H) 38 - 126 U/L   Total Bilirubin 0.2 (L) 0.3 - 1.2 mg/dL   GFR calc non Af Amer >60 >60 mL/min   GFR calc Af Amer >60 >60 mL/min   Anion gap 8 5 - 15  CBC with Differential  Result Value Ref Range   WBC 7.1 4.0 - 10.5 K/uL   RBC 3.49 (L) 3.87 - 5.11 MIL/uL   Hemoglobin 10.7 (L) 12.0 - 15.0 g/dL   HCT 33.0 (L) 36.0 - 46.0 %   MCV 94.6 78.0 - 100.0 fL   MCH 30.7 26.0 - 34.0 pg   MCHC 32.4 30.0 - 36.0 g/dL   RDW 16.6 (H) 11.5 - 15.5 %   Platelets 178 150 - 400 K/uL   Neutrophils Relative % 55 %   Neutro Abs 4.0 1.7 - 7.7 K/uL   Lymphocytes Relative 33 %   Lymphs Abs 2.4 0.7 - 4.0 K/uL   Monocytes Relative 8 %   Monocytes Absolute 0.6 0.1 - 1.0 K/uL   Eosinophils Relative 4 %   Eosinophils Absolute 0.3 0.0 - 0.7 K/uL   Basophils Relative 0 %   Basophils Absolute 0.0 0.0 - 0.1 K/uL  Urinalysis, Routine w reflex microscopic  Result Value Ref Range   Color, Urine YELLOW YELLOW   APPearance CLEAR CLEAR   Specific Gravity, Urine 1.015  1.005 - 1.030   pH 7.0 5.0 - 8.0   Glucose, UA NEGATIVE NEGATIVE mg/dL   Hgb urine dipstick NEGATIVE NEGATIVE   Bilirubin Urine NEGATIVE NEGATIVE   Ketones, ur NEGATIVE NEGATIVE mg/dL   Protein, ur NEGATIVE NEGATIVE mg/dL   Nitrite NEGATIVE NEGATIVE   Leukocytes, UA NEGATIVE NEGATIVE   Laboratory interpretation all normal except stable mild anemia      Results for orders placed or performed in visit on 02/12/15  CBC with Differential  Result Value Ref Range   WBC 9.2 4.0 - 10.5 K/uL   RBC 3.62 (L) 3.87 - 5.11 MIL/uL   Hemoglobin 10.9 (L) 12.0 - 15.0 g/dL   HCT 34.4 (L) 36.0 - 46.0 %   MCV 95.0 78.0 - 100.0 fL   MCH 30.1 26.0 - 34.0 pg   MCHC 31.7 30.0 - 36.0 g/dL   RDW 19.5 (H) 11.5 - 15.5 %   Platelets 222 150 - 400 K/uL   Neutrophils Relative % 55 %   Neutro Abs 5.2 1.7 - 7.7 K/uL   Lymphocytes Relative 24 %   Lymphs Abs 2.2 0.7 - 4.0 K/uL   Monocytes Relative 18 %   Monocytes Absolute 1.6 (H) 0.1 - 1.0 K/uL   Eosinophils Relative 3 %  Eosinophils Absolute 0.3 0.0 - 0.7 K/uL   Basophils Relative 0 %   Basophils Absolute 0.0 0.0 - 0.1 K/uL  Comprehensive metabolic panel  Result Value Ref Range   Sodium 135 135 - 145 mmol/L   Potassium 4.2 3.5 - 5.1 mmol/L   Chloride 99 (L) 101 - 111 mmol/L   CO2 28 22 - 32 mmol/L   Glucose, Bld 80 65 - 99 mg/dL   BUN 20 6 - 20 mg/dL   Creatinine, Ser 0.48 0.44 - 1.00 mg/dL   Calcium 9.2 8.9 - 10.3 mg/dL   Total Protein 7.7 6.5 - 8.1 g/dL   Albumin 3.7 3.5 - 5.0 g/dL   AST 39 15 - 41 U/L   ALT 29 14 - 54 U/L   Alkaline Phosphatase 216 (H) 38 - 126 U/L   Total Bilirubin 0.3 0.3 - 1.2 mg/dL   GFR calc non Af Amer >60 >60 mL/min   GFR calc Af Amer >60 >60 mL/min   Anion gap 8 5 - 15  CEA  Result Value Ref Range   CEA 17.4 (H) 0.0 - 4.7 ng/mL   Imaging Review Dg Chest 2 View  02/19/2015  CLINICAL DATA:  Fever for days. History of colon cancer with liver metastasis. Last chemotherapy on Wednesday. EXAM: CHEST  2 VIEW  COMPARISON:  11/28/2014 FINDINGS: Hyperinflation. Power port type central venous catheter with tip over the mid SVC region. The heart size and mediastinal contours are within normal limits. Both lungs are clear. The visualized skeletal structures are unremarkable. IMPRESSION: No active cardiopulmonary disease. Electronically Signed   By: Lucienne Capers M.D.   On: 02/19/2015 00:11     Ct Chest W Contrast  01/29/2015  CLINICAL DATA:  Colon cancer, surgery and chemotherapy, liver metastases, Crohn disease.  IMPRESSION: 1. Progressive hepatic metastatic disease. 2. Postoperative changes in the colon with a descending colostomy. Electronically Signed   By: Lorin Picket M.D.   On: 01/29/2015 14:21   Ct Abdomen Pelvis W Contrast  01/29/2015  CLINICAL DATA:  Colon cancer, surgery and chemotherapy, liver metastases, Crohn disease.  IMPRESSION: 1. Progressive hepatic metastatic disease. 2. Postoperative changes in the colon with a descending colostomy. Electronically Signed   By: Lorin Picket M.D.   On: 01/29/2015 14:21   Dg Abd 2 Views  01/20/2015  CLINICAL DATA:  Left mid to lower abdominal pain and vomiting.  IMPRESSION: 1. No evidence of bowel obstruction. 2. Moderately distended stomach containing fluid. Electronically Signed   By: Logan Bores M.D.   On: 01/20/2015 12:28       I have personally reviewed and evaluated these images and lab results as part of my medical decision-making.   EKG Interpretation None      MDM   Final diagnoses:  Other specified fever    Plan discharge  [name], MD, FACEP   I personally performed the services described in this documentation, which was scribed in my presence. The recorded information has been reviewed and considered.  [name], MD, Barbette Or, MD 02/19/15 [phone]

## 2015-02-19 LAB — CBC WITH DIFFERENTIAL/PLATELET
BASOS ABS: 0 10*3/uL (ref 0.0–0.1)
BASOS PCT: 0 %
EOS ABS: 0.3 10*3/uL (ref 0.0–0.7)
EOS PCT: 4 %
HCT: 33 % — ABNORMAL LOW (ref 36.0–46.0)
Hemoglobin: 10.7 g/dL — ABNORMAL LOW (ref 12.0–15.0)
Lymphocytes Relative: 33 %
Lymphs Abs: 2.4 10*3/uL (ref 0.7–4.0)
MCH: 30.7 pg (ref 26.0–34.0)
MCHC: 32.4 g/dL (ref 30.0–36.0)
MCV: 94.6 fL (ref 78.0–100.0)
Monocytes Absolute: 0.6 10*3/uL (ref 0.1–1.0)
Monocytes Relative: 8 %
Neutro Abs: 4 10*3/uL (ref 1.7–7.7)
Neutrophils Relative %: 55 %
PLATELETS: 178 10*3/uL (ref 150–400)
RBC: 3.49 MIL/uL — AB (ref 3.87–5.11)
RDW: 16.6 % — ABNORMAL HIGH (ref 11.5–15.5)
WBC: 7.1 10*3/uL (ref 4.0–10.5)

## 2015-02-19 LAB — COMPREHENSIVE METABOLIC PANEL
ALT: 23 U/L (ref 14–54)
AST: 23 U/L (ref 15–41)
Albumin: 3.4 g/dL — ABNORMAL LOW (ref 3.5–5.0)
Alkaline Phosphatase: 177 U/L — ABNORMAL HIGH (ref 38–126)
Anion gap: 8 (ref 5–15)
BILIRUBIN TOTAL: 0.2 mg/dL — AB (ref 0.3–1.2)
BUN: 17 mg/dL (ref 6–20)
CALCIUM: 9.1 mg/dL (ref 8.9–10.3)
CO2: 29 mmol/L (ref 22–32)
CREATININE: 0.49 mg/dL (ref 0.44–1.00)
Chloride: 98 mmol/L — ABNORMAL LOW (ref 101–111)
Glucose, Bld: 113 mg/dL — ABNORMAL HIGH (ref 65–99)
Potassium: 3.6 mmol/L (ref 3.5–5.1)
Sodium: 135 mmol/L (ref 135–145)
TOTAL PROTEIN: 7.3 g/dL (ref 6.5–8.1)

## 2015-02-19 NOTE — Discharge Instructions (Signed)
I discussed your test results with Dr [name]. Your labs are good, your WBC is normal. Continue to monitor your fever and let Dr [name] know if you get more fever.

## 2015-02-20 ENCOUNTER — Telehealth (HOSPITAL_COMMUNITY): Payer: Self-pay

## 2015-02-20 LAB — URINE CULTURE

## 2015-02-20 NOTE — Telephone Encounter (Signed)
Call from Sydney Morrison stating, "I'm calling to let you know how things went with my new chemo regimen.  According to my scales, I've lost 3 pounds in 7 days, the nausea has been relentless, had diarrhea on Friday that I finally got stopped with the imodium and it restarted yesterday.  I know that Dr. [name] thinks this is the regimen that I need to be on and I'm suppose to come Monday for the next treatment and just wanted to let Dr. [name] know how my week has been so she can make any changes that needs to be made before I come on Monday.  I don't need a call back, just wanted Dr.Penland to know what was going on."

## 2015-02-24 LAB — CULTURE, BLOOD (ROUTINE X 2)
Culture: NO GROWTH
Culture: NO GROWTH

## 2015-02-25 NOTE — Progress Notes (Signed)
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  S/P 9 cycles of FOLFOX + Avastin last given on 80/05/4915 complicated by intolerance and CT imaging demonstrating progression of disease.  INTERVAL HISTORY: Sydney Morrison 59 y.o. female returns for followup of Stage IV CRC (KRAS mutated), S/P 9 cycles of FOLFOX +Avastin with hospitalization due to complication of cancer and treatment with CT imaging demonstrating progression of disease resulting in a change in therapy to FOLFIRI on 02/12/2015 with patient refusing the addition of Avastin.  Patient has been difficult to treat given many patient reported intolerances and patient's inability to tolerate typically well tolerated chemotherapy regimens.    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 Diffuse liver metastases   07/02/2014 Imaging Changes consistent with the known history of colon carcinoma with metastatic disease involving the liver and peritoneum. Recent stent placement within the colonic lesion. Some fecal material is noted within the proximal portion of the stent which a...   07/05/2014 Pathology Results Liver, needle/core biopsy, left lobe - METASTATIC ADENOCARCINOMA   07/19/2014 - 08/16/2014 Chemotherapy FOLFOX   07/23/2014 Imaging Similar findings of advanced stage IV metastatic colon cancer with perhaps slight interval progression of disease in the liver.   08/25/2014 Treatment Plan Change Chemotherapy held due to hospitalization   08/25/2014 - 09/21/2014 Hospital Admission SBO   08/26/2014 Imaging High-grade distal small  bowel obstruction, likely from peritoneal metastatic disease.  Extensive hepatic metastatic disease. The largest metastasis in the left lobe has decreased from 07/23/2014.   08/31/2014 Surgery Colon, segmental resection for tumor, rectum - INVASIVE ADENOCARCINOMA, MODERATELY DIFFERENTIATED - TUMOR INVADES THROUGH SEROSA, SEE COMMENT. - PERFORATION WITH VISIBLE STENT. - LYMPHOVASCULAR AND PERINEURAL INVASION IS PRESENT. - DISTAL AND PROX...   09/15/2014 Imaging No bowel obstruction suspected status post multifocal small and large bowel resection with both primary reanastomoses and descending colostomy. Patulous appearance of colon in the right abdomen might reflect focal ileus.   11/06/2014 - 01/17/2015 Chemotherapy FOLFOX   11/28/2014 - 11/30/2014 Hospital Admission 1.   Nausea and vomiting 2.   Constipation    01/18/2015 - 01/22/2015 Hospital Admission Nausea and vomiting, UTI (lower urinary tract infection)   01/29/2015 Progression CT CAP- Progressive hepatic metastatic disease. New and enlarging heterogeneous nodules and masses in the liver measure up to 5.2 x 6.7 cm (image 56), previously 3.3 x 5.1 cm on 09/15/2014.   02/12/2015 -  Chemotherapy FOLFIRI, patient refuses Avastin.  5FU bolus deleted without administration beginning on cycle 1.    Chart reviewed.  ED visit noted form 11/20 with patient reported fever of 101 F at home.  In the ED, 98.43F.  She was discharged with negative work-up including UA, urine culture, blood cultures, etc.  Work-up is negative.  She was discharged without hospitalization.  Patient called the clinic the other day listing her issues associated with her first cycle of FOLFIRI.  In her message, she recommended chemotherapeutic change with dose reductions and changes.  We have reviewed the NCCN guidelines pertaining to treatment for KRAS mutated CRC.  Her options are limited as a result of her mutation.  Again, I printed  the NCCN guidelines pertaining to treatment for her  malignancy.  I reviewed her treatment options in detail.  We had a very serious conversation today after she listed her complaints:  1. Diarrhea, somewhat controlled with Imodium. 2. Patient reported 3 lbs weight loss.  Today, she weighs in at 100.5 lbs which is up 4 lbs since 02/05/2015. 3. Nausea, "unrelenting," for 7 days post-treatment.   4. ED visit for "fevers" at home, negative work-up in ED (extensive/complete work-up). 5. Floaters in right eye- I have recommended she follow-up with her ophthalmologist.  Our conversation consisted of the following: A. After listing her complaints, she is advised that these are side effects of therapy.  She is educated on her therapy options given her KRAS mutation, her treatment options are limited.  She is not a candidate for Cetuximab/Vectibix therapy.  Her failure/progression on first line therapy (FOLFOX) is not a good indicator of long progression-free survival.  Given her reported poor tolerance, I moved on to the next topic of conversation B. I discussed her goals of care.  She reports that her goal is to "get rid of my cancer."  She is educated, yet again, that she has Stage IV disease with extensive disease burden.  As a result, "cure" is not a valid goal of care.  "Trust me, I have read all about this disease and I understand the morbidity."  I explained that it is wonderful that she understands the morbidities, but I asked if she understands the mortality?  "She rolled her eyes.  "Yes I understand it!"  Then I returned back to her reported goal of care.  This is unrealistic and confusing to me that this would be her goal of treatment/care.  "Well I am sorry that are don't understand but it makes perfect sense to me!" C. It is important to have these discussion, I explained to [place].  "I have discussed this all with my husband."  That is wonderful, but if something were to happen in the absence of her husband, she is noted in [facility] to be a full code.  She  refused to look at me.  I explained that just talking to her husband about these issues is sub-optimal.   D. She refused Atropine and Phenergan as part of her pre-medications.  She is educated on reasonable and unreasonable ways to administer chemotherapy.  "Well, they are just like giving me water.  They don't do anything for me."  She proceeded to explain to me how they do not work for her and she knows that she is right.  From an oncology perspective, she has received phenergan, Aloxi, and Emend with every treatment.  Phenergan was used for many reasons as part of her pre-med, including the patient reporting that it has been effective for her in the past.   Not getting it today may be detrimental to her as she does not know, truly, if it works or not.  I am fearful that without this pre-medication, her "unreleneting nausea" may be worse.  Additionally, she was unable to explain to me how atropine was ineffective for her except that she is having diarrhea mildly controlled with Imodium at home.  Without Atropine, the patient is advised that her diarrhea will likely be much worse this cycle. E. She was very livid with me today during this conversation.  She says, "It sounds like you are giving up on me."  I explained that this accusation is false because if this were the case,  we would not be treating her today as planned.  Her labs meet treatment parameters and therefore, she can receive treatment today.   She again refuses Avastin today. F. We discussed the fact that every one of [place] will be dying one day, from something.  Her goal of cure is unrealistic and therefore, she is advised that she has a terminal diagnosis.  She is struggling to accept this.  She is advised that it is most important to live in this reality to make appropriate decisions, not her false sense of reality.  She refuses to discuss quality of life versus quantity of life.  I personally reviewed and went over laboratory results with the  patient.  The results are noted within this dictation.  Her labs meet treatment parameters today.  She will get treatment.  She is agreeable to Phenergan and Atropine.  She refuses Avastin today.   Past Medical History  Diagnosis Date  . Crohn's disease ([place])   . Chronic headache   . Cancer ([place])   . Nausea and vomiting 08/25/2014  . 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 [id number]; Chronic blood loss anemia; Antineoplastic chemotherapy induced anemia; Dehydration; Chemotherapy-induced neuropathy ([place]); Nausea and vomiting; SBO (small bowel obstruction) ([place]); Atrial flutter ([place]); Goals of care, counseling/discussion; Screen for STD (sexually transmitted disease); Pressure ulcer, stage 1; Postoperative fever; Leucocytosis; Protein-calorie malnutrition, severe ([place]); Tobacco abuse; Encounter for palliative care; Hyponatremia; Intolerance, food ([place]); Intractable nausea and vomiting; Constipation; Anemia of chronic disease; Thrombocytopenia ([place]); and UTI (lower urinary tract infection) on her problem list.     is allergic to avelox; codeine; dextromethorphan; erythromycin; penicillins; percocet; reglan; zofran; red dye; and suprep.  Current Outpatient Prescriptions on File Prior to Visit  Medication Sig Dispense Refill  . acetaminophen (TYLENOL) 325 MG tablet Take 325 mg by mouth every 6 (six) hours as needed for fever or headache (headahce).     . ALPRAZolam (XANAX) 0.5 MG tablet Take 0.125-0.5 mg by mouth at bedtime as needed for sleep.     . cromolyn (NASALCROM) 5.2 MG/ACT nasal spray Place 1 spray into both nostrils at bedtime.    . Cyanocobalamin (VITAMIN B-12 CR) 1500 MCG TBCR Take 1 tablet by mouth daily.    Marland Kitchen dexamethasone (DECADRON) 4 MG tablet Take 1 tablet (4 mg total) by mouth 2 (two) times daily. For 2 days. Begin day of pump disconnect. 8 tablet 2  . diphenoxylate-atropine (LOMOTIL) 2.5-0.025 MG tablet May take 1-2 tablets four times a day as needed for  loose stools. 45 tablet 0  . ENSURE (ENSURE) Take 237 mLs by mouth 2 (two) times daily with a meal.    . Fluorouracil (ADRUCIL IV) Inject into the vein every 14 (fourteen) days.    Burnadette Peter OIL Apply 1 application topically daily.    Marland Kitchen GENERLAC 10 GM/15ML SOLN Take 10 g by mouth daily as needed (for constipation).     . LEUCOVORIN CALCIUM IV Inject into the vein. Every 14 days    . lidocaine-prilocaine (EMLA) cream Apply 1 application topically as needed. Apply to portacath site 1-2 hours prior to use 30 g 3  . loperamide (IMODIUM A-D) 2 MG tablet Take 2 mg by mouth 4 (four) times daily as needed for diarrhea or loose stools.    . Multiple Vitamins-Minerals (MULTIVITAMIN WITH MINERALS) tablet Take 1 tablet by mouth daily.    . nicotine (NICODERM CQ - DOSED IN MG/24 HR) 7 mg/24hr patch Place 7 mg  onto the skin daily.    . Potassium Bicarb-Citric Acid 20 MEQ TBEF Take 1 tablet (20 mEq total) by mouth daily. Dissolve in ginger ale (Patient not taking: Reported on 02/05/2015) 30 each 1  . prochlorperazine (COMPAZINE) 25 MG suppository Place 1 suppository (25 mg total) rectally every 8 (eight) hours as needed for nausea or vomiting. (Patient not taking: Reported on 02/05/2015) 12 suppository 0  . promethazine (PHENERGAN) 12.5 MG tablet Take 1 tablet (12.5 mg total) by mouth every 6 (six) hours as needed for nausea. (Patient not taking: Reported on 02/05/2015) 30 tablet 0   Current Facility-Administered Medications on File Prior to Visit  Medication Dose Route Frequency Provider Last Rate Last Dose  . atropine injection 0.5 mg  0.5 mg Intravenous Once PRN Patrici Ranks, MD   0.5 mg at 02/12/15 1001  . atropine injection 0.5 mg  0.5 mg Intravenous Once PRN Patrici Ranks, MD      . fluorouracil (ADRUCIL) 3,450 mg in sodium chloride 0.9 % 81 mL chemo infusion  2,400 mg/m2 (Treatment Plan Actual) Intravenous 1 day or 1 dose Patrici Ranks, MD      . fosaprepitant (EMEND) 150 mg, dexamethasone  (DECADRON) 12 mg in sodium chloride 0.9 % 145 mL IVPB   Intravenous Once Patrici Ranks, MD      . heparin lock flush 100 unit/mL  500 Units Intracatheter Once PRN Patrici Ranks, MD      . irinotecan (CAMPTOSAR) 260 mg in dextrose 5 % 500 mL chemo infusion  180 mg/m2 (Treatment Plan Actual) Intravenous Once Patrici Ranks, MD      . leucovorin 576 mg in dextrose 5 % 250 mL infusion  400 mg/m2 (Treatment Plan Actual) Intravenous Once Patrici Ranks, MD      . promethazine (PHENERGAN) injection 12.5 mg  12.5 mg Intravenous Q4H PRN Patrici Ranks, MD   12.5 mg at 02/12/15 1000  . promethazine (PHENERGAN) injection 12.5 mg  12.5 mg Intravenous Q4H PRN Patrici Ranks, MD      . sodium chloride 0.9 % injection 10 mL  10 mL Intracatheter PRN Patrici Ranks, MD      . sodium chloride 0.9 % injection 10 mL  10 mL Intracatheter PRN Patrici Ranks, MD        Past Surgical History  Procedure Laterality Date  . Breast surgery    . Uterine fibroid surgery    . Colonoscopy w/ biopsies  06/29/2014    DR HUNG  . Flexible sigmoidoscopy N/A 06/30/2014    Procedure: FLEXIBLE SIGMOIDOSCOPY;  Surgeon: Carol Ada, MD;  Location[facility] END[facility]COPY;  Service: Endoscopy;  Laterality: N/A;  . Colonic stent placement N/A 06/30/2014    Procedure: COLONIC STENT PLACEMENT;  Surgeon: Carol Ada, MD;  Location: [facility] ENDOSCOPY;  Service: Endoscopy;  Laterality: N/A;  with fluro   . Portacath placement N/A 07/04/2014    Procedure: POWER PORT PLACEMENT;  Surgeon: Alphonsa Overall, MD;  Location: [place];  Service: General;  Laterality: N/A;  . Laparotomy N/A 08/31/2014    Procedure: EXPLORATORY LAPAROTOMY;  Surgeon: Michael Boston, MD;  Location: [facility];  Service: General;  Laterality: N/A;  . Bowel resection N/A 08/31/2014    Procedure: SMALL BOWEL RESECTION;  Surgeon: Michael Boston, MD;  Location: [facility];  Service: General;  Laterality: N/A;  . Colostomy N/A 08/31/2014    Procedure: low anterior resection with COLOSTOMY;   Surgeon: Michael Boston, MD;  Location: [facility];  Service: General;  Laterality: N/A;  . Appendectomy  08/31/2014    Procedure: APPENDECTOMY;  Surgeon: Michael Boston, MD;  Location: [facility];  Service: General;;  . Supracervical abdominal hysterectomy  08/31/2014    Procedure: HYSTERECTOMY SUPRACERVICAL ABDOMINAL BILATERAL TUBES AND OVARIES;  Surgeon: Michael Boston, MD;  Location: [facility];  Service: General;;  . Transverse colon resection  08/31/2014    Procedure: TRANSVERSE COLON RESECTION;  Surgeon: Michael Boston, MD;  Location: [facility];  Service: General;;  . Debulking  08/31/2014    Procedure: DEBULKING OF PERITONEUM;  Surgeon: Michael Boston, MD;  Location: [facility];  Service: General;;    Denies any headaches, dizziness, double vision, fevers, chills, night sweats, constipation, chest pain, heart palpitations, shortness of breath, blood in stool, black tarry stool, urinary pain, urinary burning, urinary frequency, hematuria.   PHYSICAL EXAMINATION  ECOG PERFORMANCE STATUS: 1 - Symptomatic but completely ambulatory  There were no vitals filed for this visit.  GENERAL:alert, no distress, cachectic and accompanied by her husband who does not participate in conversation today, patient is very mad during difficult conversation today. SKIN: skin color, texture, turgor are normal, no rashes or significant lesions HEAD: Normocephalic, No masses, lesions, tenderness or abnormalities EYES: normal, EOMI, Conjunctiva are pink and non-injected EARS: External ears normal OROPHARYNX:mucous membranes are moist  NECK: supple, trachea midline LYMPH:  not examined BREAST:not examined LUNGS: not examined HEART: not exmained ABDOMEN:not examined BACK: Back symmetric, no curvature. EXTREMITIES:less then 2 second capillary refill, no skin discoloration, no cyanosis  NEURO: alert & oriented x 3 with fluent speech, no focal motor/sensory deficits    LABORATORY DATA: CBC    Component Value Date/Time   WBC 6.3  02/26/2015 0930   WBC 5.5 08/16/2014 1037   RBC 3.16* 02/26/2015 0930   RBC 2.71* 09/12/2014 0515   RBC 3.61* 08/16/2014 1037   HGB 9.6* 02/26/2015 0930   HGB 9.6* 08/16/2014 1037   HCT 30.2* 02/26/2015 0930   HCT 30.9* 08/16/2014 1037   PLT 202 02/26/2015 0930   PLT 167 08/16/2014 1037   MCV 95.6 02/26/2015 0930   MCV 85.6 08/16/2014 1037   MCH 30.4 02/26/2015 0930   MCH 26.6 08/16/2014 1037   MCHC 31.8 02/26/2015 0930   MCHC 31.1* 08/16/2014 1037   RDW 16.6* 02/26/2015 0930   RDW 16.1* 08/16/2014 1037   LYMPHSABS 1.4 02/26/2015 0930   LYMPHSABS 1.2 08/16/2014 1037   MONOABS 0.9 02/26/2015 0930   MONOABS 0.9 08/16/2014 1037   EOSABS 0.3 02/26/2015 0930   EOSABS 0.2 08/16/2014 1037   BASOSABS 0.0 02/26/2015 0930   BASOSABS 0.0 08/16/2014 1037      Chemistry      Component Value Date/Time   NA 135 02/26/2015 0930   NA 140 08/16/2014 1037   K 3.9 02/26/2015 0930   K 4.2 08/16/2014 1037   CL 103 02/26/2015 0930   CO2 27 02/26/2015 0930   CO2 28 08/16/2014 1037   BUN 16 02/26/2015 0930   BUN 23.1 08/16/2014 1037   CREATININE 0.51 02/26/2015 0930   CREATININE 0.7 08/16/2014 1037      Component Value Date/Time   CALCIUM 8.7* 02/26/2015 0930   CALCIUM 9.5 08/16/2014 1037   ALKPHOS 168* 02/26/2015 0930   ALKPHOS 143 08/16/2014 1037   AST 28 02/26/2015 0930   AST 21 08/16/2014 1037   ALT 24 02/26/2015 0930   ALT 15 08/16/2014 1037   BILITOT 0.4 02/26/2015 0930   BILITOT 0.28 08/16/2014 1037  Lab Results  Component Value Date   CEA 17.4* 02/12/2015      PENDING LABS:   RADIOGRAPHIC STUDIES:  Dg Chest 2 View  02/19/2015  CLINICAL DATA:  Fever for days. History of colon cancer with liver metastasis. Last chemotherapy on Wednesday. EXAM: CHEST  2 VIEW COMPARISON:  11/28/2014 FINDINGS: Hyperinflation. Power port type central venous catheter with tip over the mid SVC region. The heart size and mediastinal contours are within normal limits. Both lungs are  clear. The visualized skeletal structures are unremarkable. IMPRESSION: No active cardiopulmonary disease. Electronically Signed   By: Lucienne Capers M.D.   On: 02/19/2015 00:11   Ct Chest W Contrast  01/29/2015  CLINICAL DATA:  Colon cancer, surgery and chemotherapy, liver metastases, Crohn disease. EXAM: CT CHEST, ABDOMEN, AND PELVIS WITH CONTRAST TECHNIQUE: Multidetector CT imaging of the chest, abdomen and pelvis was performed following the standard protocol during bolus administration of intravenous contrast. CONTRAST:  130m OMNIPAQUE IOHEXOL 300 MG/ML  SOLN COMPARISON:  CT abdomen pelvis 09/15/2014 and CT chest abdomen pelvis 06/29/2014. FINDINGS: CT CHEST FINDINGS Mediastinum/Nodes: Aberrant right subclavian artery noted. No pathologically enlarged mediastinal, hilar or axillary lymph nodes. Heart size normal. No pericardial effusion. Lungs/Pleura: Mild scarring in the left lower lobe. 5 mm subpleural nodule in the left lower lobe (image 29) is unchanged from 06/29/2014. Lungs are otherwise clear. No pleural fluid. Airway is unremarkable. Musculoskeletal: No worrisome lytic or sclerotic lesions. CT ABDOMEN PELVIS FINDINGS Hepatobiliary: New and enlarging heterogeneous nodules and masses in the liver measure up to 5.2 x 6.7 cm (image 56), previously 3.3 x 5.1 cm on 09/15/2014. Gallbladder is unremarkable. No biliary ductal dilatation. Pancreas: Negative. Spleen: Low-attenuation lesions in the spleen may be due to phase of contrast enhancement as they are not readily visualized on nephrographic phase imaging. Adrenals/Urinary Tract: Adrenal glands and kidneys are unremarkable. Bladder is unremarkable. Stomach/Bowel: Stomach and small bowel are unremarkable. Scattered postoperative changes in the colon with a descending left lower quadrant colostomy. Vascular/Lymphatic: Atherosclerotic calcification of the arterial vasculature without abdominal aortic aneurysm. No pathologically enlarged lymph nodes.  Reproductive: Uterus may be atrophic. Ovaries are not well-visualized. Other: Mild presacral edema/ thickening. No free fluid. Mesenteries and peritoneum are grossly unremarkable. Musculoskeletal: No worrisome lytic or sclerotic lesions. IMPRESSION: 1. Progressive hepatic metastatic disease. 2. Postoperative changes in the colon with a descending colostomy. Electronically Signed   By: MLorin PicketM.D.   On: 01/29/2015 14:21   Ct Abdomen Pelvis W Contrast  01/29/2015  CLINICAL DATA:  Colon cancer, surgery and chemotherapy, liver metastases, Crohn disease. EXAM: CT CHEST, ABDOMEN, AND PELVIS WITH CONTRAST TECHNIQUE: Multidetector CT imaging of the chest, abdomen and pelvis was performed following the standard protocol during bolus administration of intravenous contrast. CONTRAST:  1066mOMNIPAQUE IOHEXOL 300 MG/ML  SOLN COMPARISON:  CT abdomen pelvis 09/15/2014 and CT chest abdomen pelvis 06/29/2014. FINDINGS: CT CHEST FINDINGS Mediastinum/Nodes: Aberrant right subclavian artery noted. No pathologically enlarged mediastinal, hilar or axillary lymph nodes. Heart size normal. No pericardial effusion. Lungs/Pleura: Mild scarring in the left lower lobe. 5 mm subpleural nodule in the left lower lobe (image 29) is unchanged from 06/29/2014. Lungs are otherwise clear. No pleural fluid. Airway is unremarkable. Musculoskeletal: No worrisome lytic or sclerotic lesions. CT ABDOMEN PELVIS FINDINGS Hepatobiliary: New and enlarging heterogeneous nodules and masses in the liver measure up to 5.2 x 6.7 cm (image 56), previously 3.3 x 5.1 cm on 09/15/2014. Gallbladder is unremarkable. No biliary ductal dilatation. Pancreas: Negative. Spleen: Low-attenuation lesions  in the spleen may be due to phase of contrast enhancement as they are not readily visualized on nephrographic phase imaging. Adrenals/Urinary Tract: Adrenal glands and kidneys are unremarkable. Bladder is unremarkable. Stomach/Bowel: Stomach and small bowel are  unremarkable. Scattered postoperative changes in the colon with a descending left lower quadrant colostomy. Vascular/Lymphatic: Atherosclerotic calcification of the arterial vasculature without abdominal aortic aneurysm. No pathologically enlarged lymph nodes. Reproductive: Uterus may be atrophic. Ovaries are not well-visualized. Other: Mild presacral edema/ thickening. No free fluid. Mesenteries and peritoneum are grossly unremarkable. Musculoskeletal: No worrisome lytic or sclerotic lesions. IMPRESSION: 1. Progressive hepatic metastatic disease. 2. Postoperative changes in the colon with a descending colostomy. Electronically Signed   By: Lorin Picket M.D.   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 Michael Boston on 08/31/2014 when she was admitted with SBO.  Initially treated with FOLFOX + Avastin x 9 cycles complicated by hospitalization and CT imaging proving progression of disease.  As a result, a change in therapy beginning on 02/12/2015 to FOLFIRI without 5FU bolus and patient refusing Avastin therapy. Patient has been difficult to treat given many patient reported intolerances and patient's inability to tolerate typically well tolerated chemotherapy regimens.  Oncology history updated.  Patient expresses concern regarding her tolerance to FOLFIRI chemotherapy following her first cycle 2 weeks ago.  Last week, she called the clinic listing her complaints to allow Dr. [name] time to change her chemotherapeutic regimen prior to today.  Unfortunately, given her KRAS mutation, treatment options are limited.  Dr. [name], in detail, has reviewed the NCCN recommended treatment regimens previously on multiple  occassions.  I re-iterated this today with the patient and her husband.  She is refusing a number of pre-medications today, including atropine, that are being harmful to her treatment.  As mentioned above, she is refusing Avastin therapy which is not only indicated, but well tolerated and provides a significant progression-free/overall survival.  She has been making treatment decisions to the detriment of her oncology care.  She has been unrealistic with her goals of care.  Code status was addressed, but no progress was made as the patient refused to look at me during this conversation at times.  I discussed side effects of current treatment.  We discusses safe and unsafe interventions, reasonable and unreasonable chemotherapy dosing and management of intolerances, quantity versus quality of life.    Palliative care consult ordered, discussed with Quinn Axe, NP.  Return in 2 weeks for follow-up.  Continued discussion with goals of care will moving forward along with code status discussions.     THERAPY PLAN:  Continue treatment as planned.  Serious discussion regarding goals of care and code status occurred today without significant progress.  All questions were answered. The patient knows to call the clinic with any problems, questions or concerns. We can certainly see the patient much sooner if necessary.  Patient and plan discussed with Dr. Ancil Linsey and she is in agreement with the aforementioned.   This note is electronically signed by: Doy Mince 02/26/2015 11:22 AM

## 2015-02-25 NOTE — Assessment & Plan Note (Addendum)
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 Michael Boston on 08/31/2014 when she was admitted with SBO.  Initially treated with FOLFOX + Avastin x 9 cycles complicated by hospitalization and CT imaging proving progression of disease.  As a result, a change in therapy beginning on 02/12/2015 to FOLFIRI without 5FU bolus and patient refusing Avastin therapy. Patient has been difficult to treat given many patient reported intolerances and patient's inability to tolerate typically well tolerated chemotherapy regimens.  Oncology history updated.  Patient expresses concern regarding her tolerance to FOLFIRI chemotherapy following her first cycle 2 weeks ago.  Last week, she called the clinic listing her complaints to allow Dr. [name] time to change her chemotherapeutic regimen prior to today.  Unfortunately, given her KRAS mutation, treatment options are limited.  Dr. [name], in detail, has reviewed the NCCN recommended treatment regimens previously on multiple occassions.  I re-iterated this today with the patient and her husband.  She is refusing a number of pre-medications today, including atropine, that are being harmful to her treatment.  As mentioned above, she is refusing Avastin therapy which is not only indicated, but well tolerated and provides a significant progression-free/overall survival.  She has been making treatment decisions to the detriment of her oncology care.  She has been unrealistic with her goals of care.  Code status was addressed, but no progress was made as the patient refused to look at me during this conversation at times.  I discussed side effects of current treatment.  We discusses safe and unsafe interventions, reasonable and unreasonable chemotherapy dosing and management of intolerances, quantity versus  quality of life.    Palliative care consult ordered, discussed with Quinn Axe, NP.  Return in 2 weeks for follow-up.  Continued discussion with goals of care will moving forward along with code status discussions.

## 2015-02-26 ENCOUNTER — Encounter (HOSPITAL_BASED_OUTPATIENT_CLINIC_OR_DEPARTMENT_OTHER): Payer: Commercial Managed Care - HMO | Admitting: Oncology

## 2015-02-26 ENCOUNTER — Encounter (HOSPITAL_BASED_OUTPATIENT_CLINIC_OR_DEPARTMENT_OTHER): Payer: Commercial Managed Care - HMO | Admitting: Primary Care

## 2015-02-26 ENCOUNTER — Encounter (HOSPITAL_BASED_OUTPATIENT_CLINIC_OR_DEPARTMENT_OTHER): Payer: Commercial Managed Care - HMO

## 2015-02-26 DIAGNOSIS — Z7189 Other specified counseling: Secondary | ICD-10-CM | POA: Diagnosis not present

## 2015-02-26 DIAGNOSIS — Z5111 Encounter for antineoplastic chemotherapy: Secondary | ICD-10-CM | POA: Diagnosis not present

## 2015-02-26 DIAGNOSIS — C787 Secondary malignant neoplasm of liver and intrahepatic bile duct: Secondary | ICD-10-CM

## 2015-02-26 DIAGNOSIS — E43 Unspecified severe protein-calorie malnutrition: Secondary | ICD-10-CM | POA: Diagnosis not present

## 2015-02-26 DIAGNOSIS — T451X5A Adverse effect of antineoplastic and immunosuppressive drugs, initial encounter: Secondary | ICD-10-CM

## 2015-02-26 DIAGNOSIS — C189 Malignant neoplasm of colon, unspecified: Secondary | ICD-10-CM

## 2015-02-26 DIAGNOSIS — G620 Drug-induced polyneuropathy: Secondary | ICD-10-CM

## 2015-02-26 DIAGNOSIS — Z515 Encounter for palliative care: Secondary | ICD-10-CM

## 2015-02-26 LAB — COMPREHENSIVE METABOLIC PANEL
ALBUMIN: 3.2 g/dL — AB (ref 3.5–5.0)
ALT: 24 U/L (ref 14–54)
AST: 28 U/L (ref 15–41)
Alkaline Phosphatase: 168 U/L — ABNORMAL HIGH (ref 38–126)
Anion gap: 5 (ref 5–15)
BUN: 16 mg/dL (ref 6–20)
CHLORIDE: 103 mmol/L (ref 101–111)
CO2: 27 mmol/L (ref 22–32)
CREATININE: 0.51 mg/dL (ref 0.44–1.00)
Calcium: 8.7 mg/dL — ABNORMAL LOW (ref 8.9–10.3)
GFR calc Af Amer: >60 mL/min (ref >60)
GLUCOSE: 83 mg/dL (ref 65–99)
POTASSIUM: 3.9 mmol/L (ref 3.5–5.1)
SODIUM: 135 mmol/L (ref 135–145)
Total Bilirubin: 0.4 mg/dL (ref 0.3–1.2)
Total Protein: 7.1 g/dL (ref 6.5–8.1)

## 2015-02-26 LAB — CBC WITH DIFFERENTIAL/PLATELET
BASOS ABS: 0 10*3/uL (ref 0.0–0.1)
BASOS PCT: 1 %
EOS PCT: 4 %
Eosinophils Absolute: 0.3 10*3/uL (ref 0.0–0.7)
HCT: 30.2 % — ABNORMAL LOW (ref 36.0–46.0)
Hemoglobin: 9.6 g/dL — ABNORMAL LOW (ref 12.0–15.0)
LYMPHS PCT: 22 %
Lymphs Abs: 1.4 10*3/uL (ref 0.7–4.0)
MCH: 30.4 pg (ref 26.0–34.0)
MCHC: 31.8 g/dL (ref 30.0–36.0)
MCV: 95.6 fL (ref 78.0–100.0)
Monocytes Absolute: 0.9 10*3/uL (ref 0.1–1.0)
Monocytes Relative: 14 %
Neutro Abs: 3.7 10*3/uL (ref 1.7–7.7)
Neutrophils Relative %: 59 %
PLATELETS: 202 10*3/uL (ref 150–400)
RBC: 3.16 MIL/uL — AB (ref 3.87–5.11)
RDW: 16.6 % — AB (ref 11.5–15.5)
WBC: 6.3 10*3/uL (ref 4.0–10.5)

## 2015-02-26 MED ORDER — PALONOSETRON HCL INJECTION 0.25 MG/5ML
INTRAVENOUS | Status: AC
  Filled 2015-02-26: qty 5

## 2015-02-26 MED ORDER — ATROPINE SULFATE 1 MG/ML IJ SOLN
INTRAMUSCULAR | Status: AC
  Filled 2015-02-26: qty 1

## 2015-02-26 MED ORDER — LEUCOVORIN CALCIUM INJECTION 350 MG
400.0000 mg/m2 | Freq: Once | INTRAVENOUS | Status: AC
  Administered 2015-02-26: 576 mg via INTRAVENOUS
  Filled 2015-02-26: qty 28.8

## 2015-02-26 MED ORDER — ATROPINE SULFATE 1 MG/ML IJ SOLN
0.5000 mg | Freq: Once | INTRAMUSCULAR | Status: AC | PRN
  Administered 2015-02-26: 0.5 mg via INTRAVENOUS

## 2015-02-26 MED ORDER — SODIUM CHLORIDE 0.9 % IV SOLN
Freq: Once | INTRAVENOUS | Status: AC
  Administered 2015-02-26: 11:00:00 via INTRAVENOUS

## 2015-02-26 MED ORDER — PROMETHAZINE HCL 25 MG/ML IJ SOLN
12.5000 mg | INTRAMUSCULAR | Status: DC | PRN
  Administered 2015-02-26: 12.5 mg via INTRAVENOUS
  Filled 2015-02-26: qty 1

## 2015-02-26 MED ORDER — IRINOTECAN HCL CHEMO INJECTION 100 MG/5ML
180.0000 mg/m2 | Freq: Once | INTRAVENOUS | Status: AC
  Administered 2015-02-26: 260 mg via INTRAVENOUS
  Filled 2015-02-26: qty 4.33

## 2015-02-26 MED ORDER — SODIUM CHLORIDE 0.9 % IJ SOLN: 10.0000 mL | INTRAMUSCULAR | Status: DC | PRN

## 2015-02-26 MED ORDER — FLUOROURACIL CHEMO INJECTION 5 GM/100ML
2400.0000 mg/m2 | INTRAVENOUS | Status: DC
  Administered 2015-02-26: 3450 mg via INTRAVENOUS
  Filled 2015-02-26: qty 69

## 2015-02-26 MED ORDER — SODIUM CHLORIDE 0.9 % IV SOLN
Freq: Once | INTRAVENOUS | Status: AC
  Administered 2015-02-26: 12:00:00 via INTRAVENOUS
  Filled 2015-02-26: qty 5

## 2015-02-26 MED ORDER — PALONOSETRON HCL INJECTION 0.25 MG/5ML
0.2500 mg | Freq: Once | INTRAVENOUS | Status: AC
  Administered 2015-02-26: 0.25 mg via INTRAVENOUS

## 2015-02-26 NOTE — Patient Instructions (Signed)
[place] at [facility] Discharge Instructions  RECOMMENDATIONS MADE BY THE CONSULTANT AND ANY TEST RESULTS WILL BE SENT TO YOUR REFERRING PHYSICIAN.  Exam and discussion by Robynn Pane, PA-C Report fevers, uncontrolled nausea, vomiting, or other concerns. Use anti-emetics and anti-diarrheal medications as needed.  Follow-up:  Chemotherapy and office visit in 2 weeks.  Thank you for choosing [place] at [facility] to provide your oncology and hematology care.  To afford each patient quality time with our provider, please arrive at least 15 minutes before your scheduled appointment time.    You need to re-schedule your appointment should you arrive 10 or more minutes late.  We strive to give you quality time with our providers, and arriving late affects you and other patients whose appointments are after yours.  Also, if you no show three or more times for appointments you may be dismissed from the clinic at the providers discretion.     Again, thank you for choosing [facility].  Our hope is that these requests will decrease the amount of time that you wait before being seen by our physicians.       _____________________________________________________________  Should you have questions after your visit to [facility], please contact our office at [phone] between the hours of 8:30 a.m. and 4:30 p.m.  Voicemails left after 4:30 p.m. will not be returned until the following business day.  For prescription refill requests, have your pharmacy contact our office.

## 2015-02-26 NOTE — Progress Notes (Signed)
Consultation Note Date: 02/26/2015   Patient Name: Sydney Morrison  DOB: 05-Sep-1955  MRN: [id number]  Age / Sex: 59 y.o., female   PCP: Celene Squibb, MD Referring Physician: Drue Novel, NP  Reason for Consultation: Establishing goals of care and Psychosocial/spiritual support  Palliative Care Assessment and Plan Summary of Established Goals of Care and Medical Treatment Preferences   Clinical Assessment/Narrative: Mrs. Talluto is talking on the phone with her insurance provider about her ostomy bags as I enter. Her husband is at bedside and we talk about where they live [employer]) and the [employer].  Mr. Knappe shares that Mrs. Truby didn't want to know any timelines or prognosis.   I introduce myself when Mrs. Dansie finishes her call.  We talk about general health concerns and her ongoing nausea.  I share the pressure point used in PACU. She knows of this and demonstrates on herself, but tells me this didn't work either.  We talk about other CAM, and they share that Mr. Catullo cousin, Ames Coupe, had been doing some healing therapy for her, but hasn't been able to lately because of Terri's mothers cancer. We talk about family (used to work with Mr. Wainio), and my cousin who is Mrs. Campanile's friend.   The RN enters to administer meds and we talk about cancer treatments.  Mr. Gauvin states the first treatment "didnt work", Mrs. Hunnel does not respond to this statement.  We talk about this round and how she will be treated. She tells me that this chemo is every 2 weeks, and she isn't sure of the duration (number of treatments).  Side effects last time were flushing and sweating.   I ask about coming back to see her on her next treatment and she agrees.  We talk about the "hard news" she got this morning and she shares that, "it was how it was said".  We talk about palliative medicine and the goal of "being honest", and how oncology sharing time lines and outcomes allows patients and  families to prepare.  I share that we are all here to support her and that I am sorry she had a bad experience.  Mrs. Lalonde becomes tearful and talks about her faith, and that God will take her when he wants, her work is not done.  We talk about relationships with others as part of that work, including those of [place] in the hospital.  Mrs. Brinkerhoff tells me, "I'm not unrealistic, they think I am, but I'm not."   She again, tearfully talks about what God has in store for her.    We will need to discuss HCPOA and Advanced directives on our next visit.    Contacts/Participants in Discussion: Primary Decision Maker: Mrs. Faw is able to make her own decisions at this time.    HCPOA: Not discussed today.    Code Status/Advance Care Planning:  Full at this time.   Symptom Management:   Mrs. Goggin tells me that she has not found effective med for her nausea.   Palliative Prophylaxis: unsure   Psycho-social/Spiritual:   Support System: Lives with husband. Talks about her faith in [place] and his purpose for her.   Desire for further Chaplaincy support:no  Prognosis: Unable to determine, Mrs. Zinman failed first line treatment.   Discharge Planning:  Home.  Treatments every 2 weeks.        Chief Complaint: Mrs. Copado is seen today for stage 4 colon cancer.  History of  Present Illness: Mrs. Sullins has stage 4 colon cancer that   Primary Diagnoses  1. @PROBSPOA @  Palliative Review of Systems: No pain, anxiety or dyspnea.  Nausea that is uncontrolled, and treated with Ativan to make her sleep.  I have reviewed the medical record, interviewed the patient and family, and examined the patient. The following aspects are pertinent.  Past Medical History  Diagnosis Date  . Crohn's disease ([place])   . Chronic headache   . Cancer ([place])   . Nausea and vomiting 08/25/2014  . Sinus infection    Social History   Social History  . Marital Status: Single    Spouse Name: N/A  . Number of  Children: N/A  . Years of Education: N/A   Social History Main Topics  . Smoking status: Former Smoker -- 0.50 packs/day for 40 years    Types: Cigarettes    Quit date: 06/25/2014  . Smokeless tobacco: Never Used  . Alcohol Use: No  . Drug Use: No  . Sexual Activity: Not on file   Other Topics Concern  . Not on file   Social History Narrative   Married, husband Roderic Palau   Two grown children   Strong christian faith   Family History  Problem Relation Age of Onset  . Hypertension Mother   . Cancer Father    Scheduled Meds: Continuous Infusions: PRN Meds:. Medications Prior to Admission:  Prior to Admission medications   Medication Sig Start Date End Date Taking? Authorizing Provider  acetaminophen (TYLENOL) 325 MG tablet Take 325 mg by mouth every 6 (six) hours as needed for fever or headache (headahce).     Historical Provider, MD  ALPRAZolam Duanne Moron) 0.5 MG tablet Take 0.125-0.5 mg by mouth at bedtime as needed for sleep.  09/17/14   Historical Provider, MD  cromolyn (NASALCROM) 5.2 MG/ACT nasal spray Place 1 spray into both nostrils at bedtime.    Historical Provider, MD  Cyanocobalamin (VITAMIN B-12 CR) 1500 MCG TBCR Take 1 tablet by mouth daily.    Historical Provider, MD  dexamethasone (DECADRON) 4 MG tablet Take 1 tablet (4 mg total) by mouth 2 (two) times daily. For 2 days. Begin day of pump disconnect. 02/14/15   Baird Cancer, PA-C  diphenoxylate-atropine (LOMOTIL) 2.5-0.025 MG tablet May take 1-2 tablets four times a day as needed for loose stools. 02/05/15   Baird Cancer, PA-C  ENSURE (ENSURE) Take 237 mLs by mouth 2 (two) times daily with a meal.    Historical Provider, MD  Fluorouracil (ADRUCIL IV) Inject into the vein every 14 (fourteen) days.    Historical Provider, MD  Frankincense OIL Apply 1 application topically daily.    Historical Provider, MD  GENERLAC 10 GM/15ML SOLN Take 10 g by mouth daily as needed (for constipation).  11/30/14   Historical Provider,  MD  LEUCOVORIN CALCIUM IV Inject into the vein. Every 14 days    Historical Provider, MD  lidocaine-prilocaine (EMLA) cream Apply 1 application topically as needed. Apply to portacath site 1-2 hours prior to use 07/14/14   Owens Shark, NP  loperamide (IMODIUM A-D) 2 MG tablet Take 2 mg by mouth 4 (four) times daily as needed for diarrhea or loose stools.    Historical Provider, MD  Multiple Vitamins-Minerals (MULTIVITAMIN WITH MINERALS) tablet Take 1 tablet by mouth daily.    Historical Provider, MD  nicotine (NICODERM CQ - DOSED IN MG/24 HR) 7 mg/24hr patch Place 7 mg onto the skin daily.    Historical  Provider, MD  Potassium Bicarb-Citric Acid 20 MEQ TBEF Take 1 tablet (20 mEq total) by mouth daily. Dissolve in ginger ale Patient not taking: Reported on 02/05/2015 11/20/14   Patrici Ranks, MD  prochlorperazine (COMPAZINE) 25 MG suppository Place 1 suppository (25 mg total) rectally every 8 (eight) hours as needed for nausea or vomiting. Patient not taking: Reported on 02/05/2015 01/22/15   Erline Hau, MD  promethazine (PHENERGAN) 12.5 MG tablet Take 1 tablet (12.5 mg total) by mouth every 6 (six) hours as needed for nausea. Patient not taking: Reported on 02/05/2015 01/22/15   Erline Hau, MD   Allergies  Allergen Reactions  . Avelox [Moxifloxacin Hcl In Nacl] Hives  . Codeine Hives  . Dextromethorphan Hives  . Erythromycin Hives  . Penicillins Hives    No problem with Imipenem  . Percocet [Oxycodone-Acetaminophen] Nausea And Vomiting    Causes immediate vomiting  . Reglan [Metoclopramide]     Heart rate went up  . Zofran [Ondansetron Hcl]     Dry heaves  . Red Dye Rash  . Suprep [Na Sulfate-K Sulfate-Mg Sulf] Nausea And Vomiting   CBC:    Component Value Date/Time   WBC 6.3 02/26/2015 0930   WBC 5.5 08/16/2014 1037   HGB 9.6* 02/26/2015 0930   HGB 9.6* 08/16/2014 1037   HCT 30.2* 02/26/2015 0930   HCT 30.9* 08/16/2014 1037   PLT 202 02/26/2015  0930   PLT 167 08/16/2014 1037   MCV 95.6 02/26/2015 0930   MCV 85.6 08/16/2014 1037   NEUTROABS 3.7 02/26/2015 0930   NEUTROABS 3.3 08/16/2014 1037   LYMPHSABS 1.4 02/26/2015 0930   LYMPHSABS 1.2 08/16/2014 1037   MONOABS 0.9 02/26/2015 0930   MONOABS 0.9 08/16/2014 1037   EOSABS 0.3 02/26/2015 0930   EOSABS 0.2 08/16/2014 1037   BASOSABS 0.0 02/26/2015 0930   BASOSABS 0.0 08/16/2014 1037   Comprehensive Metabolic Panel:    Component Value Date/Time   NA 135 02/26/2015 0930   NA 140 08/16/2014 1037   K 3.9 02/26/2015 0930   K 4.2 08/16/2014 1037   CL 103 02/26/2015 0930   CO2 27 02/26/2015 0930   CO2 28 08/16/2014 1037   BUN 16 02/26/2015 0930   BUN 23.1 08/16/2014 1037   CREATININE 0.51 02/26/2015 0930   CREATININE 0.7 08/16/2014 1037   GLUCOSE 83 02/26/2015 0930   GLUCOSE 92 08/16/2014 1037   CALCIUM 8.7* 02/26/2015 0930   CALCIUM 9.5 08/16/2014 1037   AST 28 02/26/2015 0930   AST 21 08/16/2014 1037   ALT 24 02/26/2015 0930   ALT 15 08/16/2014 1037   ALKPHOS 168* 02/26/2015 0930   ALKPHOS 143 08/16/2014 1037   BILITOT 0.4 02/26/2015 0930   BILITOT 0.28 08/16/2014 1037   PROT 7.1 02/26/2015 0930   PROT 7.5 08/16/2014 1037   ALBUMIN 3.2* 02/26/2015 0930   ALBUMIN 3.8 08/16/2014 1037    Physical Exam: Vital Signs: There were no vitals taken for this visit. SpO2:   O2 Device:   O2 Flow Rate:   Intake/output summary: @IOBRIEF @ LBM:   Baseline Weight:   Most recent weight:    Exam Findings:  Constitutional:  Frail, cachectic Resp: even and non labored Cardio: no edema.          Palliative Performance Scale: 50-60% at best.               Additional Data Reviewed: Recent Labs     02/26/15  0930  WBC  6.3  HGB  9.6*  PLT  202  NA  135  BUN  16  CREATININE  0.51     Time In: 0940 Time Out: 1010 Time Total: 30 minutes  Greater than 50%  of this time was spent counseling and coordinating care related to the above assessment and plan.  Signed  by: Drue Novel, NP  Drue Novel, NP  02/26/2015, 3:27 PM  Please contact Palliative Medicine Team phone at [phone] for questions and concerns.    Patient ID: JAMEILA FONNESBECK, female   DOB: [birth year], 59 y.o.   MRN: [id number]

## 2015-02-26 NOTE — Patient Instructions (Signed)
[facility] Discharge Instructions for Patients Receiving Chemotherapy  Today you received the following chemotherapy agents: Irinotecan and Leucovorin.      If you develop nausea and vomiting, or diarrhea that is not controlled by your medication, call the clinic.  The clinic phone number is [phone]. Office hours are Monday-Friday 8:30am-5:00pm.  BELOW ARE SYMPTOMS THAT SHOULD BE REPORTED IMMEDIATELY:  *FEVER GREATER THAN 101.0 F  *CHILLS WITH OR WITHOUT FEVER  NAUSEA AND VOMITING THAT IS NOT CONTROLLED WITH YOUR NAUSEA MEDICATION  *UNUSUAL SHORTNESS OF BREATH  *UNUSUAL BRUISING OR BLEEDING  TENDERNESS IN MOUTH AND THROAT WITH OR WITHOUT PRESENCE OF ULCERS  *URINARY PROBLEMS  *BOWEL PROBLEMS  UNUSUAL RASH Items with * indicate a potential emergency and should be followed up as soon as possible. If you have an emergency after office hours please contact your primary care physician or go to the nearest emergency department.  Please call the clinic during office hours if you have any questions or concerns.   You may also contact the Patient Navigator at [phone] should you have any questions or need assistance in obtaining follow up care.

## 2015-02-28 ENCOUNTER — Encounter (HOSPITAL_BASED_OUTPATIENT_CLINIC_OR_DEPARTMENT_OTHER): Payer: Commercial Managed Care - HMO

## 2015-02-28 DIAGNOSIS — C787 Secondary malignant neoplasm of liver and intrahepatic bile duct: Secondary | ICD-10-CM | POA: Diagnosis not present

## 2015-02-28 DIAGNOSIS — C189 Malignant neoplasm of colon, unspecified: Secondary | ICD-10-CM

## 2015-02-28 MED ORDER — HEPARIN SOD (PORK) LOCK FLUSH 100 UNIT/ML IV SOLN
500.0000 [IU] | Freq: Once | INTRAVENOUS | Status: AC
  Administered 2015-02-28: 500 [IU] via INTRAVENOUS

## 2015-02-28 MED ORDER — SODIUM CHLORIDE 0.9 % IJ SOLN
10.0000 mL | INTRAMUSCULAR | Status: DC | PRN
  Administered 2015-02-28: 10 mL via INTRAVENOUS
  Filled 2015-02-28: qty 10

## 2015-02-28 NOTE — Progress Notes (Signed)
Patient tolerated home infusion well.  Denies any complaints or concerns with this treatment.  She reports that she is eating well and has had little to no nausea.  VSS.

## 2015-02-28 NOTE — Progress Notes (Signed)
Sanjuan Dame presents to have home infusion pump d/c'd and for port-a-cath flush and deaccess.  Portacath flushed with NS and 500U/46ml Heparin, and needle removed intact.  Procedure tolerated well and without incident.

## 2015-02-28 NOTE — Patient Instructions (Signed)
[place] at [facility] Discharge Instructions  RECOMMENDATIONS MADE BY THE CONSULTANT AND ANY TEST RESULTS WILL BE SENT TO YOUR REFERRING PHYSICIAN.  Infusion pump removal and port flush today. Return as scheduled for office visit and chemotherapy. Call the clinic with any questions/concerns.   Thank you for choosing [place] at [facility] to provide your oncology and hematology care.  To afford each patient quality time with our provider, please arrive at least 15 minutes before your scheduled appointment time.    You need to re-schedule your appointment should you arrive 10 or more minutes late.  We strive to give you quality time with our providers, and arriving late affects you and other patients whose appointments are after yours.  Also, if you no show three or more times for appointments you may be dismissed from the clinic at the providers discretion.     Again, thank you for choosing [facility].  Our hope is that these requests will decrease the amount of time that you wait before being seen by our physicians.       _____________________________________________________________  Should you have questions after your visit to [facility], please contact our office at [phone] between the hours of 8:30 a.m. and 4:30 p.m.  Voicemails left after 4:30 p.m. will not be returned until the following business day.  For prescription refill requests, have your pharmacy contact our office.

## 2015-03-12 ENCOUNTER — Encounter (HOSPITAL_COMMUNITY): Payer: Commercial Managed Care - HMO | Attending: Hematology & Oncology

## 2015-03-12 ENCOUNTER — Encounter (HOSPITAL_COMMUNITY): Payer: Commercial Managed Care - HMO | Admitting: Primary Care

## 2015-03-12 ENCOUNTER — Ambulatory Visit (HOSPITAL_COMMUNITY): Payer: Commercial Managed Care - HMO | Admitting: Hematology & Oncology

## 2015-03-12 VITALS — BP 117/59 | HR 74 | Temp 98.5°F | Resp 14

## 2015-03-12 DIAGNOSIS — E876 Hypokalemia: Secondary | ICD-10-CM | POA: Diagnosis present

## 2015-03-12 DIAGNOSIS — C787 Secondary malignant neoplasm of liver and intrahepatic bile duct: Secondary | ICD-10-CM | POA: Diagnosis present

## 2015-03-12 DIAGNOSIS — Z5111 Encounter for antineoplastic chemotherapy: Secondary | ICD-10-CM | POA: Diagnosis not present

## 2015-03-12 DIAGNOSIS — C189 Malignant neoplasm of colon, unspecified: Secondary | ICD-10-CM

## 2015-03-12 DIAGNOSIS — R112 Nausea with vomiting, unspecified: Secondary | ICD-10-CM | POA: Diagnosis present

## 2015-03-12 LAB — COMPREHENSIVE METABOLIC PANEL
ALBUMIN: 3.2 g/dL — AB (ref 3.5–5.0)
ALK PHOS: 188 U/L — AB (ref 38–126)
ALT: 32 U/L (ref 14–54)
ANION GAP: 8 (ref 5–15)
AST: 31 U/L (ref 15–41)
BUN: 19 mg/dL (ref 6–20)
CO2: 26 mmol/L (ref 22–32)
Calcium: 8.7 mg/dL — ABNORMAL LOW (ref 8.9–10.3)
Chloride: 101 mmol/L (ref 101–111)
Creatinine, Ser: 0.53 mg/dL (ref 0.44–1.00)
GLUCOSE: 84 mg/dL (ref 65–99)
Potassium: 4 mmol/L (ref 3.5–5.1)
SODIUM: 135 mmol/L (ref 135–145)
Total Bilirubin: 0.3 mg/dL (ref 0.3–1.2)
Total Protein: 7.2 g/dL (ref 6.5–8.1)

## 2015-03-12 LAB — CBC WITH DIFFERENTIAL/PLATELET
BASOS ABS: 0 10*3/uL (ref 0.0–0.1)
BASOS PCT: 1 %
EOS ABS: 0.2 10*3/uL (ref 0.0–0.7)
Eosinophils Relative: 3 %
HCT: 30 % — ABNORMAL LOW (ref 36.0–46.0)
HEMOGLOBIN: 9.6 g/dL — AB (ref 12.0–15.0)
LYMPHS ABS: 1.2 10*3/uL (ref 0.7–4.0)
LYMPHS PCT: 20 %
MCH: 30.3 pg (ref 26.0–34.0)
MCHC: 32 g/dL (ref 30.0–36.0)
MCV: 94.6 fL (ref 78.0–100.0)
MONO ABS: 1.2 10*3/uL — AB (ref 0.1–1.0)
MONOS PCT: 20 %
Neutro Abs: 3.3 10*3/uL (ref 1.7–7.7)
Neutrophils Relative %: 56 %
PLATELETS: 242 10*3/uL (ref 150–400)
RBC: 3.17 MIL/uL — AB (ref 3.87–5.11)
RDW: 15.5 % (ref 11.5–15.5)
WBC: 5.9 10*3/uL (ref 4.0–10.5)

## 2015-03-12 MED ORDER — SODIUM CHLORIDE 0.9 % IJ SOLN
10.0000 mL | INTRAMUSCULAR | Status: DC | PRN
  Administered 2015-03-12: 10 mL
  Filled 2015-03-12: qty 10

## 2015-03-12 MED ORDER — LEUCOVORIN CALCIUM INJECTION 350 MG
400.0000 mg/m2 | Freq: Once | INTRAVENOUS | Status: AC
  Administered 2015-03-12: 576 mg via INTRAVENOUS
  Filled 2015-03-12: qty 28.8

## 2015-03-12 MED ORDER — ATROPINE SULFATE 1 MG/ML IJ SOLN
0.5000 mg | Freq: Once | INTRAMUSCULAR | Status: AC | PRN
  Administered 2015-03-12: 0.5 mg via INTRAVENOUS
  Filled 2015-03-12: qty 1

## 2015-03-12 MED ORDER — SODIUM CHLORIDE 0.9 % IV SOLN
Freq: Once | INTRAVENOUS | Status: AC
  Administered 2015-03-12: 10:00:00 via INTRAVENOUS
  Filled 2015-03-12: qty 5

## 2015-03-12 MED ORDER — PALONOSETRON HCL INJECTION 0.25 MG/5ML
0.2500 mg | Freq: Once | INTRAVENOUS | Status: AC
  Administered 2015-03-12: 0.25 mg via INTRAVENOUS
  Filled 2015-03-12: qty 5

## 2015-03-12 MED ORDER — PROMETHAZINE HCL 25 MG/ML IJ SOLN
12.5000 mg | INTRAMUSCULAR | Status: DC | PRN
  Filled 2015-03-12: qty 1

## 2015-03-12 MED ORDER — IRINOTECAN HCL CHEMO INJECTION 100 MG/5ML
180.0000 mg/m2 | Freq: Once | INTRAVENOUS | Status: AC
  Administered 2015-03-12: 260 mg via INTRAVENOUS
  Filled 2015-03-12: qty 4.33

## 2015-03-12 MED ORDER — SODIUM CHLORIDE 0.9 % IV SOLN
Freq: Once | INTRAVENOUS | Status: AC
  Administered 2015-03-12: 10:00:00 via INTRAVENOUS

## 2015-03-12 MED ORDER — SODIUM CHLORIDE 0.9 % IV SOLN
2400.0000 mg/m2 | INTRAVENOUS | Status: DC
  Administered 2015-03-12: 3450 mg via INTRAVENOUS
  Filled 2015-03-12: qty 69

## 2015-03-12 NOTE — Patient Instructions (Signed)
[facility] Discharge Instructions for Patients Receiving Chemotherapy  Today you received the following chemotherapy agents Irinotecan, Leucovorin and 5FU pump.  To help prevent nausea and vomiting after your treatment, we encourage you to take your nausea medication as instructed. If you develop nausea and vomiting that is not controlled by your nausea medication, call the clinic. If it is after clinic hours your family physician or the after hours number for the clinic or go to the Emergency Department. BELOW ARE SYMPTOMS THAT SHOULD BE REPORTED IMMEDIATELY:  *FEVER GREATER THAN 101.0 F  *CHILLS WITH OR WITHOUT FEVER  NAUSEA AND VOMITING THAT IS NOT CONTROLLED WITH YOUR NAUSEA MEDICATION  *UNUSUAL SHORTNESS OF BREATH  *UNUSUAL BRUISING OR BLEEDING  TENDERNESS IN MOUTH AND THROAT WITH OR WITHOUT PRESENCE OF ULCERS  *URINARY PROBLEMS  *BOWEL PROBLEMS  UNUSUAL RASH Items with * indicate a potential emergency and should be followed up as soon as possible.  Return as scheduled Wednesday for pump removal.  I have been informed and understand all the instructions given to me. I know to contact the clinic, my physician, or go to the Emergency Department if any problems should occur. I do not have any questions at this time, but understand that I may call the clinic during office hours or the Patient Navigator at [phone] should I have any questions or need assistance in obtaining follow up care.    __________________________________________  _____________  __________ Signature of Patient or Authorized Representative            Date                   Time    __________________________________________ Nurse's Signature

## 2015-03-12 NOTE — Progress Notes (Signed)
Patient refused to take phenergan as ordered. She states she knows that she has never taken before and she was given a dose last treatment and it made her high. She said it made her feel terrible and all she wanted to do was sleep and she could not function like that. Encouraged patient and reminded her that she was not as nauseated last treatment because she took the phenergan and that Gershon Mussel had discussed with her the benefits of taking ordered antiemetics. Patient just smiled and states I know, but refused phenergan anyway.   Tolerated chemo well. Ambulatory on discharge home with husband. Continuous infusion pump intact.

## 2015-03-13 LAB — CEA: CEA: 16.2 ng/mL — ABNORMAL HIGH (ref 0.0–4.7)

## 2015-03-14 ENCOUNTER — Encounter (HOSPITAL_BASED_OUTPATIENT_CLINIC_OR_DEPARTMENT_OTHER): Payer: Commercial Managed Care - HMO

## 2015-03-14 ENCOUNTER — Encounter (HOSPITAL_COMMUNITY): Payer: Self-pay

## 2015-03-14 VITALS — BP 110/44 | HR 69 | Temp 98.1°F | Resp 16

## 2015-03-14 DIAGNOSIS — Z452 Encounter for adjustment and management of vascular access device: Secondary | ICD-10-CM | POA: Diagnosis not present

## 2015-03-14 DIAGNOSIS — C787 Secondary malignant neoplasm of liver and intrahepatic bile duct: Secondary | ICD-10-CM

## 2015-03-14 DIAGNOSIS — C189 Malignant neoplasm of colon, unspecified: Secondary | ICD-10-CM

## 2015-03-14 MED ORDER — HEPARIN SOD (PORK) LOCK FLUSH 100 UNIT/ML IV SOLN
500.0000 [IU] | Freq: Once | INTRAVENOUS | Status: AC | PRN
  Administered 2015-03-14: 500 [IU]
  Filled 2015-03-14: qty 5

## 2015-03-14 MED ORDER — SODIUM CHLORIDE 0.9 % IJ SOLN
10.0000 mL | INTRAMUSCULAR | Status: DC | PRN
  Administered 2015-03-14: 10 mL
  Filled 2015-03-14: qty 10

## 2015-03-14 NOTE — Patient Instructions (Signed)
[place] at [facility] Discharge Instructions  RECOMMENDATIONS MADE BY THE CONSULTANT AND ANY TEST RESULTS WILL BE SENT TO YOUR REFERRING PHYSICIAN.  Infusion pump removal and port flush today. Return as scheduled for chemotherapy and office visit.   Thank you for choosing [place] at [facility] to provide your oncology and hematology care.  To afford each patient quality time with our provider, please arrive at least 15 minutes before your scheduled appointment time.    You need to re-schedule your appointment should you arrive 10 or more minutes late.  We strive to give you quality time with our providers, and arriving late affects you and other patients whose appointments are after yours.  Also, if you no show three or more times for appointments you may be dismissed from the clinic at the providers discretion.     [facility]  Our hope is that these requests will decrease the amount of time that you wait before being seen by our physicians.       _____________________________________________________________  Should you have questions after your visit to [facility], please contact our office at [phone] between the hours of 8:30 a.m. and 4:30 p.m.  Voicemails left after 4:30 p.m. will not be returned until the following business day.  For prescription refill requests, have your pharmacy contact our office.

## 2015-03-14 NOTE — Progress Notes (Signed)
Sydney Morrison presents to have home infusion pump d/c and for port-a-cath f.ush/deaccess.  Portacath flushed with NS and 500U/28ml Heparin, and needle removed intact.  Procedure tolerated well and without incident.

## 2015-03-16 NOTE — Progress Notes (Signed)
This encounter was created in error - please disregard.

## 2015-03-22 ENCOUNTER — Other Ambulatory Visit (HOSPITAL_COMMUNITY): Payer: Self-pay

## 2015-03-22 ENCOUNTER — Telehealth (HOSPITAL_COMMUNITY): Payer: Self-pay

## 2015-03-22 ENCOUNTER — Other Ambulatory Visit (HOSPITAL_COMMUNITY): Payer: Self-pay | Admitting: Oncology

## 2015-03-22 DIAGNOSIS — C787 Secondary malignant neoplasm of liver and intrahepatic bile duct: Principal | ICD-10-CM

## 2015-03-22 DIAGNOSIS — C189 Malignant neoplasm of colon, unspecified: Secondary | ICD-10-CM

## 2015-03-22 NOTE — Telephone Encounter (Signed)
Call from patient with complaints of abdominal cramping since dinner last night and at 2 am vomited multiple times until now just having dry heaves. Had temp of 99.5 last night and is 98.1 this am.  Had stool through ostomy yesterday but nothing today.  Did not use nausea medication stating "none of it helps."Encouraged to go to ED and patient refused stating "I've already been up most of the night and I don't want to go to the ED and wait another 6 hours."  Discussed with Robynn Pane, PA-C and patient instructed to use nausea medication, begin liquid diet and if she wants to come in for IV fluids.  Wants to try liquid diet to give bowels a rest and will call if symptoms worsen or if she wants IV fluids.  Instructed if pain, nausea or vomiting worsens to go to the ED.

## 2015-03-22 NOTE — Telephone Encounter (Signed)
Call from patient and states "I've had another episode of vomiting and it's hard to even drink water."  When questioned about use of antiemetics stated that she had'nt and doesn't even have anything other than ativan and that makes her sleepy.  Still doesn't want to go to ED and appointment made for her to come for IV fluids tomorrow morning and instructed to take the ativan in the interim.  Instructed that she needs to go to the ED if pain or vomiting worsens.  Verbalized understanding of instructions.

## 2015-03-23 ENCOUNTER — Encounter (HOSPITAL_COMMUNITY): Payer: Self-pay

## 2015-03-23 ENCOUNTER — Encounter (HOSPITAL_BASED_OUTPATIENT_CLINIC_OR_DEPARTMENT_OTHER): Payer: Commercial Managed Care - HMO

## 2015-03-23 DIAGNOSIS — C189 Malignant neoplasm of colon, unspecified: Secondary | ICD-10-CM

## 2015-03-23 DIAGNOSIS — C787 Secondary malignant neoplasm of liver and intrahepatic bile duct: Secondary | ICD-10-CM

## 2015-03-23 LAB — CBC WITH DIFFERENTIAL/PLATELET
BASOS ABS: 0 10*3/uL (ref 0.0–0.1)
BASOS PCT: 0 %
EOS ABS: 0.2 10*3/uL (ref 0.0–0.7)
Eosinophils Relative: 2 %
HCT: 33.4 % — ABNORMAL LOW (ref 36.0–46.0)
HEMOGLOBIN: 10.6 g/dL — AB (ref 12.0–15.0)
Lymphocytes Relative: 19 %
Lymphs Abs: 1.8 10*3/uL (ref 0.7–4.0)
MCH: 29.9 pg (ref 26.0–34.0)
MCHC: 31.7 g/dL (ref 30.0–36.0)
MCV: 94.4 fL (ref 78.0–100.0)
Monocytes Absolute: 1.2 10*3/uL — ABNORMAL HIGH (ref 0.1–1.0)
Monocytes Relative: 13 %
NEUTROS PCT: 66 %
Neutro Abs: 6.2 10*3/uL (ref 1.7–7.7)
Platelets: 303 10*3/uL (ref 150–400)
RBC: 3.54 MIL/uL — AB (ref 3.87–5.11)
RDW: 15.2 % (ref 11.5–15.5)
WBC: 9.3 10*3/uL (ref 4.0–10.5)

## 2015-03-23 LAB — COMPREHENSIVE METABOLIC PANEL
ALBUMIN: 3.7 g/dL (ref 3.5–5.0)
ALK PHOS: 206 U/L — AB (ref 38–126)
ALT: 31 U/L (ref 14–54)
ANION GAP: 11 (ref 5–15)
AST: 26 U/L (ref 15–41)
BUN: 36 mg/dL — ABNORMAL HIGH (ref 6–20)
CALCIUM: 9.6 mg/dL (ref 8.9–10.3)
CO2: 32 mmol/L (ref 22–32)
Chloride: 95 mmol/L — ABNORMAL LOW (ref 101–111)
Creatinine, Ser: 0.81 mg/dL (ref 0.44–1.00)
GFR calc Af Amer: >60 mL/min (ref >60)
GFR calc non Af Amer: >60 mL/min (ref >60)
GLUCOSE: 150 mg/dL — AB (ref 65–99)
Potassium: 3.6 mmol/L (ref 3.5–5.1)
SODIUM: 138 mmol/L (ref 135–145)
Total Bilirubin: 0.5 mg/dL (ref 0.3–1.2)
Total Protein: 8 g/dL (ref 6.5–8.1)

## 2015-03-23 MED ORDER — SODIUM CHLORIDE 0.9 % IV SOLN
INTRAVENOUS | Status: DC
  Administered 2015-03-23: 09:00:00 via INTRAVENOUS

## 2015-03-23 MED ORDER — LORAZEPAM 0.5 MG PO TABS: 0.5000 mg | ORAL_TABLET | Freq: Four times a day (QID) | ORAL | Status: AC | PRN

## 2015-03-23 MED ORDER — SODIUM CHLORIDE 0.9 % IJ SOLN
10.0000 mL | INTRAMUSCULAR | Status: DC | PRN
Start: 2015-03-23 — End: 2015-03-23
  Administered 2015-03-23: 10 mL via INTRAVENOUS
  Filled 2015-03-23: qty 10

## 2015-03-23 MED ORDER — HEPARIN SOD (PORK) LOCK FLUSH 100 UNIT/ML IV SOLN
500.0000 [IU] | Freq: Once | INTRAVENOUS | Status: AC
  Administered 2015-03-23: 500 [IU] via INTRAVENOUS
  Filled 2015-03-23: qty 5

## 2015-03-23 NOTE — Patient Instructions (Signed)
[place] at [facility] Discharge Instructions  RECOMMENDATIONS MADE BY THE CONSULTANT AND ANY TEST RESULTS WILL BE SENT TO YOUR REFERRING PHYSICIAN.  Today you received one liter of IV fluids (normal saline). Return as scheduled for office visit and chemotherapy.   Thank you for choosing [place] at [facility] to provide your oncology and hematology care.  To afford each patient quality time with our provider, please arrive at least 15 minutes before your scheduled appointment time.    You need to re-schedule your appointment should you arrive 10 or more minutes late.  We strive to give you quality time with our providers, and arriving late affects you and other patients whose appointments are after yours.  Also, if you no show three or more times for appointments you may be dismissed from the clinic at the providers discretion.     Again, thank you for choosing [facility].  Our hope is that these requests will decrease the amount of time that you wait before being seen by our physicians.       _____________________________________________________________  Should you have questions after your visit to [facility], please contact our office at [phone] between the hours of 8:30 a.m. and 4:30 p.m.  Voicemails left after 4:30 p.m. will not be returned until the following business day.  For prescription refill requests, have your pharmacy contact our office.

## 2015-03-23 NOTE — Progress Notes (Signed)
A&Ox4, in no distress.  Discharged ambulatory in c/o spouse.

## 2015-03-27 ENCOUNTER — Encounter (HOSPITAL_BASED_OUTPATIENT_CLINIC_OR_DEPARTMENT_OTHER): Payer: Commercial Managed Care - HMO | Admitting: Hematology & Oncology

## 2015-03-27 ENCOUNTER — Encounter (HOSPITAL_BASED_OUTPATIENT_CLINIC_OR_DEPARTMENT_OTHER): Payer: Commercial Managed Care - HMO

## 2015-03-27 ENCOUNTER — Encounter (HOSPITAL_COMMUNITY): Payer: Self-pay | Admitting: Hematology & Oncology

## 2015-03-27 VITALS — BP 136/64 | HR 79 | Temp 98.1°F | Resp 16 | Wt 99.4 lb

## 2015-03-27 VITALS — BP 103/56 | HR 65 | Temp 98.4°F | Resp 15

## 2015-03-27 DIAGNOSIS — C787 Secondary malignant neoplasm of liver and intrahepatic bile duct: Secondary | ICD-10-CM | POA: Diagnosis not present

## 2015-03-27 DIAGNOSIS — Z5111 Encounter for antineoplastic chemotherapy: Secondary | ICD-10-CM

## 2015-03-27 DIAGNOSIS — C189 Malignant neoplasm of colon, unspecified: Secondary | ICD-10-CM | POA: Diagnosis not present

## 2015-03-27 LAB — COMPREHENSIVE METABOLIC PANEL
ALK PHOS: 174 U/L — AB (ref 38–126)
ALT: 22 U/L (ref 14–54)
AST: 25 U/L (ref 15–41)
Albumin: 3.2 g/dL — ABNORMAL LOW (ref 3.5–5.0)
Anion gap: 8 (ref 5–15)
BILIRUBIN TOTAL: 0.2 mg/dL — AB (ref 0.3–1.2)
BUN: 17 mg/dL (ref 6–20)
CALCIUM: 8.7 mg/dL — AB (ref 8.9–10.3)
CO2: 26 mmol/L (ref 22–32)
CREATININE: 0.51 mg/dL (ref 0.44–1.00)
Chloride: 103 mmol/L (ref 101–111)
GFR calc non Af Amer: >60 mL/min (ref >60)
GLUCOSE: 112 mg/dL — AB (ref 65–99)
Potassium: 3.5 mmol/L (ref 3.5–5.1)
SODIUM: 137 mmol/L (ref 135–145)
TOTAL PROTEIN: 7 g/dL (ref 6.5–8.1)

## 2015-03-27 LAB — CBC WITH DIFFERENTIAL/PLATELET
Basophils Absolute: 0 10*3/uL (ref 0.0–0.1)
Basophils Relative: 1 %
Eosinophils Absolute: 0.2 10*3/uL (ref 0.0–0.7)
Eosinophils Relative: 4 %
HCT: 27.8 % — ABNORMAL LOW (ref 36.0–46.0)
Hemoglobin: 8.9 g/dL — ABNORMAL LOW (ref 12.0–15.0)
Lymphocytes Relative: 27 %
Lymphs Abs: 1.4 10*3/uL (ref 0.7–4.0)
MCH: 30.2 pg (ref 26.0–34.0)
MCHC: 32 g/dL (ref 30.0–36.0)
MCV: 94.2 fL (ref 78.0–100.0)
Monocytes Absolute: 1 10*3/uL (ref 0.1–1.0)
Monocytes Relative: 19 %
Neutro Abs: 2.5 10*3/uL (ref 1.7–7.7)
Neutrophils Relative %: 49 %
Platelets: 282 10*3/uL (ref 150–400)
RBC: 2.95 MIL/uL — ABNORMAL LOW (ref 3.87–5.11)
RDW: 14.8 % (ref 11.5–15.5)
WBC: 5.1 10*3/uL (ref 4.0–10.5)

## 2015-03-27 MED ORDER — SODIUM CHLORIDE 0.9 % IV SOLN
2400.0000 mg/m2 | INTRAVENOUS | Status: DC
  Administered 2015-03-27: 3450 mg via INTRAVENOUS
  Filled 2015-03-27: qty 69

## 2015-03-27 MED ORDER — SODIUM CHLORIDE 0.9 % IV SOLN
Freq: Once | INTRAVENOUS | Status: AC
  Administered 2015-03-27: 10:00:00 via INTRAVENOUS
  Filled 2015-03-27: qty 5

## 2015-03-27 MED ORDER — IRINOTECAN HCL CHEMO INJECTION 100 MG/5ML
180.0000 mg/m2 | Freq: Once | INTRAVENOUS | Status: AC
  Administered 2015-03-27: 260 mg via INTRAVENOUS
  Filled 2015-03-27: qty 4.33

## 2015-03-27 MED ORDER — ATROPINE SULFATE 1 MG/ML IJ SOLN
0.5000 mg | Freq: Once | INTRAMUSCULAR | Status: AC | PRN
  Administered 2015-03-27: 0.5 mg via INTRAVENOUS

## 2015-03-27 MED ORDER — SODIUM CHLORIDE 0.9 % IV SOLN
Freq: Once | INTRAVENOUS | Status: AC
Start: 2015-03-27 — End: 2015-03-27
  Administered 2015-03-27: 10:00:00 via INTRAVENOUS

## 2015-03-27 MED ORDER — LEUCOVORIN CALCIUM INJECTION 350 MG
400.0000 mg/m2 | Freq: Once | INTRAVENOUS | Status: AC
  Administered 2015-03-27: 576 mg via INTRAVENOUS
  Filled 2015-03-27: qty 28.8

## 2015-03-27 MED ORDER — PALONOSETRON HCL INJECTION 0.25 MG/5ML
0.2500 mg | Freq: Once | INTRAVENOUS | Status: AC
  Administered 2015-03-27: 0.25 mg via INTRAVENOUS
  Filled 2015-03-27: qty 5

## 2015-03-27 MED ORDER — SODIUM CHLORIDE 0.9 % IJ SOLN
10.0000 mL | INTRAMUSCULAR | Status: DC | PRN
  Administered 2015-03-27: 10 mL
  Filled 2015-03-27: qty 10

## 2015-03-27 MED ORDER — ATROPINE SULFATE 1 MG/ML IJ SOLN
INTRAMUSCULAR | Status: AC
  Filled 2015-03-27: qty 1

## 2015-03-27 NOTE — Progress Notes (Signed)
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 Diffuse liver metastases   07/02/2014 Imaging Changes consistent with the known history of colon carcinoma with metastatic disease involving the liver and peritoneum. Recent stent placement within the colonic lesion. Some fecal material is noted within the proximal portion of the stent which a...   07/05/2014 Pathology Results Liver, needle/core biopsy, left lobe - METASTATIC ADENOCARCINOMA   07/19/2014 - 08/16/2014 Chemotherapy FOLFOX   07/23/2014 Imaging Similar findings of advanced stage IV metastatic colon cancer with perhaps slight interval progression of disease in the liver.   08/25/2014 Treatment Plan Change Chemotherapy held due to hospitalization   08/25/2014 - 09/21/2014 Hospital Admission SBO   08/26/2014 Imaging High-grade distal small bowel obstruction, likely from peritoneal metastatic disease.  Extensive hepatic metastatic disease. The largest metastasis in the left lobe has decreased from 07/23/2014.   08/31/2014 Surgery Colon, segmental resection for tumor, rectum - INVASIVE ADENOCARCINOMA, MODERATELY DIFFERENTIATED - TUMOR INVADES THROUGH SEROSA, SEE COMMENT. - PERFORATION WITH VISIBLE STENT. - LYMPHOVASCULAR AND PERINEURAL INVASION IS PRESENT. - DISTAL AND PROX...   09/15/2014 Imaging No bowel obstruction suspected status post multifocal small and large bowel resection with both primary reanastomoses and descending colostomy. Patulous appearance of colon in the right abdomen might reflect focal ileus.   11/06/2014 - 01/17/2015 Chemotherapy FOLFOX   11/28/2014 - 11/30/2014 Hospital Admission 1.   Nausea and vomiting 2.     Constipation    01/18/2015 - 01/22/2015 Hospital Admission Nausea and vomiting, UTI (lower urinary tract infection)   01/29/2015 Progression CT CAP- Progressive hepatic metastatic disease. New and enlarging heterogeneous nodules and masses in the liver measure up to 5.2 x 6.7 cm (image 56), previously 3.3 x 5.1 cm on 09/15/2014.   02/12/2015 -  Chemotherapy FOLFIRI, patient refuses Avastin.  5FU bolus deleted without administration beginning on cycle 1.    CURRENT THERAPY: FOLFIRI  INTERVAL HISTORY: Sydney Morrison 59 y.o. 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 Michael Boston on 08/31/2014 when she was admitted with SBO.   [name] is here with her husband today and feeling well. Denies any problems. Her husband reports that she has been doing well most times with an instance of dehydration. She was not having any output into her bag though her stomach felt as though she would have diarrhea. However, she did not have diarrhea or any output and instead began vomiting. She began rehydrating and took stool softeners. She felt much better after rehydrating and began to output into her bag again.  She has intermittent fever at nights over the past several months at about 16 F. They did come to the hospital when it was 101.5 F.  She notes that Dr. Learta Codding told her they were cancer fevers.  She states that when she came to the ED for the temp of 101.5 and they sent her home she made the choice to take tylenol, go to bed and if the fever was present on awakening, she would come in to the ER.  The pressure sore on her back continues to hurt but it has healed over. Stating that the pain is not bad but it is still there. She continues to experience mouth sores which do not affect her ability to eat. She  uses OTC medication to alleviate the discomfort. Reports rash under her ostomy wafer.  We discussed how she has not gained weight, while also not losing weight.   Past Medical History  Diagnosis Date  . Crohn's disease ([place])   . Chronic headache   . Cancer ([place])   . Nausea and vomiting 08/25/2014  . 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 [id number]; Chronic blood loss anemia; Antineoplastic chemotherapy induced anemia; Dehydration; Chemotherapy-induced neuropathy ([place]); Nausea and vomiting; SBO (small bowel obstruction) ([place]); Atrial flutter ([place]); Goals of care, counseling/discussion; Screen for STD (sexually transmitted disease); Pressure ulcer, stage 1; Postoperative fever; Leucocytosis; Protein-calorie malnutrition, severe ([place]); Tobacco abuse; Encounter for palliative care; Hyponatremia; Intolerance, food ([place]); Intractable nausea and vomiting; Constipation; Anemia of chronic disease; Thrombocytopenia ([place]); and UTI (lower urinary tract infection) on her problem list.     is allergic to avelox; codeine; dextromethorphan; erythromycin; penicillins; percocet; reglan; zofran; red dye; and suprep.  Current Outpatient Prescriptions on File Prior to Visit  Medication Sig Dispense Refill  . acetaminophen (TYLENOL) 325 MG tablet Take 325 mg by mouth every 6 (six) hours as needed for fever or headache (headahce).     . ALPRAZolam (XANAX) 0.5 MG tablet Take 0.125-0.5 mg by mouth at bedtime as needed for sleep.     . cromolyn (NASALCROM) 5.2 MG/ACT nasal spray Place 1 spray into both nostrils at bedtime.    . Cyanocobalamin (VITAMIN B-12 CR) 1500 MCG TBCR Take 1 tablet by mouth daily.    Marland Kitchen ENSURE (ENSURE) Take 237 mLs by mouth 2 (two) times daily with a meal.    . Fluorouracil (ADRUCIL IV) Inject into the vein every 14 (fourteen) days.    Burnadette Peter OIL Apply 1 application topically daily.    Marland Kitchen GENERLAC 10 GM/15ML SOLN Take 10 g by mouth daily as needed (for  constipation).     Marland Kitchen lidocaine-prilocaine (EMLA) cream Apply 1 application topically as needed. Apply to portacath site 1-2 hours prior to use 30 g 3  . loperamide (IMODIUM A-D) 2 MG tablet Take 2 mg by mouth 4 (four) times daily as needed for diarrhea or loose stools.    Marland Kitchen LORazepam (ATIVAN) 0.5 MG tablet Take 1 tablet (0.5 mg total) by mouth every 6 (six) hours as needed (nausea/vomiting). May take SL if concern for vomiting exists 45 tablet 2  . Multiple Vitamins-Minerals (MULTIVITAMIN WITH MINERALS) tablet Take 1 tablet by mouth daily.    . nicotine (NICODERM CQ - DOSED IN MG/24 HR) 7 mg/24hr patch Place 7 mg onto the skin daily.    . benzocaine (ZILACTIN-B) 10 % mucosal gel Use as directed 1 application in the mouth or throat as needed for mouth pain. Reported on 03/27/2015    . dexamethasone (DECADRON) 4 MG tablet Take 1 tablet (4 mg total) by mouth 2 (two) times daily. For 2 days. Begin day of pump disconnect. (Patient not taking: Reported on 03/27/2015) 8 tablet 2  . diphenoxylate-atropine (LOMOTIL) 2.5-0.025 MG tablet May take 1-2 tablets four times a day as needed for loose stools. (Patient not taking: Reported on 03/12/2015) 45 tablet 0  . LEUCOVORIN CALCIUM IV Inject into the vein. Reported on 03/27/2015     Current Facility-Administered Medications  on File Prior to Visit  Medication Dose Route Frequency Provider Last Rate Last Dose  . atropine injection 0.5 mg  0.5 mg Intravenous Once PRN Patrici Ranks, MD   0.5 mg at 02/12/15 1001  . heparin lock flush 100 unit/mL  500 Units Intracatheter Once PRN Patrici Ranks, MD      . promethazine (PHENERGAN) injection 12.5 mg  12.5 mg Intravenous Q4H PRN Patrici Ranks, MD   12.5 mg at 02/12/15 1000  . sodium chloride 0.9 % injection 10 mL  10 mL Intracatheter PRN Patrici Ranks, MD        Past Surgical History  Procedure Laterality Date  . Breast surgery    . Uterine fibroid surgery    . Colonoscopy w/ biopsies  06/29/2014     DR HUNG  . Flexible sigmoidoscopy N/A 06/30/2014    Procedure: FLEXIBLE SIGMOIDOSCOPY;  Surgeon: Carol Ada, MD;  Location: [facility] ENDOSCOPY;  Service: Endoscopy;  Laterality: N/A;  . Colonic stent placement N/A 06/30/2014    Procedure: COLONIC STENT PLACEMENT;  Surgeon: Carol Ada, MD;  Location: [facility] ENDOSCOPY;  Service: Endoscopy;  Laterality: N/A;  with fluro   . Portacath placement N/A 07/04/2014    Procedure: POWER PORT PLACEMENT;  Surgeon: Alphonsa Overall, MD;  Location: [place];  Service: General;  Laterality: N/A;  . Laparotomy N/A 08/31/2014    Procedure: EXPLORATORY LAPAROTOMY;  Surgeon: Michael Boston, MD;  Location: [facility] ORS;  Service: General;  Laterality: N/A;  . Bowel resection N/A 08/31/2014    Procedure: SMALL BOWEL RESECTION;  Surgeon: Michael Boston, MD;  Location: [facility];  Service: General;  Laterality: N/A;  . Colostomy N/A 08/31/2014    Procedure: low anterior resection with COLOSTOMY;  Surgeon: Michael Boston, MD;  Location: [facility];  Service: General;  Laterality: N/A;  . Appendectomy  08/31/2014    Procedure: APPENDECTOMY;  Surgeon: Michael Boston, MD;  Location: [facility];  Service: General;;  . Supracervical abdominal hysterectomy  08/31/2014    Procedure: HYSTERECTOMY SUPRACERVICAL ABDOMINAL BILATERAL TUBES AND OVARIES;  Surgeon: Michael Boston, MD;  Location: [facility];  Service: General;;  . Transverse colon resection  08/31/2014    Procedure: TRANSVERSE COLON RESECTION;  Surgeon: Michael Boston, MD;  Location: [facility];  Service: General;;  . Debulking  08/31/2014    Procedure: DEBULKING OF PERITONEUM;  Surgeon: Michael Boston, MD;  Location: [facility];  Service: General;;    Denies any headaches, dizziness, double vision, chills, night sweats, nausea, constipation, chest pain, heart palpitations, shortness of breath, blood in stool, black tarry stool, urinary pain, urinary burning, urinary frequency, hematuria. Positive for rash.    Rash under ostomy wafer. Positive for mouth sores.    Do not affect  eating. Positive for diarrhea.    With stool softeners. Positive for vomiting.     One instance of vomiting with new medication. Positive for fevers.     Intermittent fever at nights.  14 point review of systems was performed and is negative except as detailed under history of present illness and above   PHYSICAL EXAMINATION ECOG PERFORMANCE STATUS: 2 - Symptomatic, <50% confined to bed  Filed Vitals:   03/27/15 0800  BP: 136/64  Pulse: 79  Temp: 98.1 F (36.7 C)  Resp: 16    Filed Weights   03/27/15 0800  Weight: 99 lb 6.4 oz (45.088 kg)    GENERAL:alert, no distress, cachectic, cooperative, smiling and accompanied by her husband, John. Onto the exam table without difficulty SKIN:  skin color, texture, turgor are normal HEAD: Normocephalic, No masses, lesions, tenderness or abnormalities EYES: normal, PERRLA, EOMI, Conjunctiva are pink and non-injected EARS: External ears normal OROPHARYNX:lips, buccal mucosa, and tongue normal and mucous membranes are moist  NECK: supple, trachea midline LYMPH:  No palpable adenopathy in the neck, supraclavicular region or axillae BREAST:not examined LUNGS: clear to auscultation and percussion HEART: regular rate & rhythm, no murmurs, no gallops, S1 normal and S2 normal ABDOMEN:abdomen soft, non-tender, normal bowel sounds and ostomy noted.  Small stomal hernia. Very thin BACK: Back symmetric, no curvature, sacral area with mild erythema, no active skin breakdown EXTREMITIES:less then 2 second capillary refill, no joint deformities, effusion, or inflammation, no skin discoloration, no cyanosis  NEURO: alert & oriented x 3 with fluent speech, no focal motor/sensory deficits   LABORATORY DATA: I have reviewed the data below as listed.  CBC    Component Value Date/Time   WBC 5.1 03/27/2015 0829   WBC 5.5 08/16/2014 1037   RBC 2.95* 03/27/2015 0829   RBC 2.71* 09/12/2014 0515   RBC 3.61* 08/16/2014 1037   HGB 8.9* 03/27/2015 0829    HGB 9.6* 08/16/2014 1037   HCT 27.8* 03/27/2015 0829   HCT 30.9* 08/16/2014 1037   PLT 282 03/27/2015 0829   PLT 167 08/16/2014 1037   MCV 94.2 03/27/2015 0829   MCV 85.6 08/16/2014 1037   MCH 30.2 03/27/2015 0829   MCH 26.6 08/16/2014 1037   MCHC 32.0 03/27/2015 0829   MCHC 31.1* 08/16/2014 1037   RDW 14.8 03/27/2015 0829   RDW 16.1* 08/16/2014 1037   LYMPHSABS 1.4 03/27/2015 0829   LYMPHSABS 1.2 08/16/2014 1037   MONOABS 1.0 03/27/2015 0829   MONOABS 0.9 08/16/2014 1037   EOSABS 0.2 03/27/2015 0829   EOSABS 0.2 08/16/2014 1037   BASOSABS 0.0 03/27/2015 0829   BASOSABS 0.0 08/16/2014 1037      Chemistry      Component Value Date/Time   NA 137 03/27/2015 0829   NA 140 08/16/2014 1037   K 3.5 03/27/2015 0829   K 4.2 08/16/2014 1037   CL 103 03/27/2015 0829   CO2 26 03/27/2015 0829   CO2 28 08/16/2014 1037   BUN 17 03/27/2015 0829   BUN 23.1 08/16/2014 1037   CREATININE 0.51 03/27/2015 0829   CREATININE 0.7 08/16/2014 1037      Component Value Date/Time   CALCIUM 8.7* 03/27/2015 0829   CALCIUM 9.5 08/16/2014 1037   ALKPHOS 174* 03/27/2015 0829   ALKPHOS 143 08/16/2014 1037   AST 25 03/27/2015 0829   AST 21 08/16/2014 1037   ALT 22 03/27/2015 0829   ALT 15 08/16/2014 1037   BILITOT 0.2* 03/27/2015 0829   BILITOT 0.28 08/16/2014 1037     Lab Results  Component Value Date   CEA 16.2* 03/12/2015    PATHOLOGY:  Diagnosis 1. Soft tissue mass, biopsy, peritoneal mass dome of bladder - METASTATIC ADENOCARCINOMA. 2. Appendix, Incidental - METASTATIC ADENOCARCINOMA. 3. Small intestine, resection, ileum - METASTATIC ADENOCARCINOMA. - RESECTION MARGINS ARE NEGATIVE. 4. Colon, segmental resection, with omentum, transverse - METASTATIC ADENOCARCINOMA INVOLVING COLON AND OMENTUM. - RESECTION MARGINS ARE NEGATIVE. 5. Soft tissue, biopsy, right pelvic sidewall nodule - METASTATIC ADENOCARCINOMA. 6. Soft tissue mass, biopsy, anterior peritoneum mass near dome -  METASTATIC ADENOCARCINOMA. 7. Colon, segmental resection for tumor, rectum - INVASIVE ADENOCARCINOMA, MODERATELY DIFFERENTIATED - TUMOR INVADES THROUGH SEROSA, SEE COMMENT. - PERFORATION WITH VISIBLE STENT. - LYMPHOVASCULAR AND PERINEURAL INVASION IS PRESENT. - DISTAL AND PROXIMAL  RESECTION MARGINS ARE NEGATIVE, SEE COMMENT FOR RADIAL MARGIN. - METASTATIC CARCINOMA IN FOUR OF FOURTEEN LYMPH NODES (4/14). - SEROSAL METASTATIC NODULES AND SATELLITE NODULES PRESENT. - SEE ONCOLOGY TABLE AND COMMENT.    RADIOLOGY: CLINICAL DATA: Colon cancer, surgery and chemotherapy, liver metastases, Crohn disease.  EXAM: CT CHEST, ABDOMEN, AND PELVIS WITH CONTRAST  TECHNIQUE: Multidetector CT imaging of the chest, abdomen and pelvis was performed following the standard protocol during bolus administration of intravenous contrast.  CONTRAST: 158m OMNIPAQUE IOHEXOL 300 MG/ML SOLN  COMPARISON: CT abdomen pelvis 09/15/2014 and CT chest abdomen pelvis 06/29/2014.  FINDINGS: CT CHEST FINDINGS  Mediastinum/Nodes: Aberrant right subclavian artery noted. No pathologically enlarged mediastinal, hilar or axillary lymph nodes. Heart size normal. No pericardial effusion.  Lungs/Pleura: Mild scarring in the left lower lobe. 5 mm subpleural nodule in the left lower lobe (image 29) is unchanged from 06/29/2014. Lungs are otherwise clear. No pleural fluid. Airway is unremarkable.  Musculoskeletal: No worrisome lytic or sclerotic lesions.  CT ABDOMEN PELVIS FINDINGS  Hepatobiliary: New and enlarging heterogeneous nodules and masses in the liver measure up to 5.2 x 6.7 cm (image 56), previously 3.3 x 5.1 cm on 09/15/2014. Gallbladder is unremarkable. No biliary ductal dilatation.  Pancreas: Negative.  Spleen: Low-attenuation lesions in the spleen may be due to phase of contrast enhancement as they are not readily visualized on nephrographic phase imaging.  Adrenals/Urinary Tract:  Adrenal glands and kidneys are unremarkable. Bladder is unremarkable.  Stomach/Bowel: Stomach and small bowel are unremarkable. Scattered postoperative changes in the colon with a descending left lower quadrant colostomy.  Vascular/Lymphatic: Atherosclerotic calcification of the arterial vasculature without abdominal aortic aneurysm. No pathologically enlarged lymph nodes.  Reproductive: Uterus may be atrophic. Ovaries are not well-visualized.  Other: Mild presacral edema/ thickening. No free fluid. Mesenteries and peritoneum are grossly unremarkable.  Musculoskeletal: No worrisome lytic or sclerotic lesions.  IMPRESSION: 1. Progressive hepatic metastatic disease. 2. Postoperative changes in the colon with a descending colostomy.   Electronically Signed  By: MLorin PicketM.D.  On: 01/29/2015 14:21  ASSESSMENT AND PLAN:  Stage IV CRC Cachexia Pressure wound -- well healing Anemia Kras mutation  Her anemia is chronic and secondary to disease, malnutrition and treatment we will continue to monitor this. I have encouraged ongoing improvement in her nutritional status.  She does not need refills. She received medication refills on Friday.  We will continue forward with FOLFIRI. She will be set up for repeat CT imaging before her next cycle of chemotherapy. Her CEA has responded slightly, perhaps she will have stable to slightly improved disease.  We discussed clinical trials, I will have to see what is available for her.   She is still a full code. She finds end of life discussions defeating and as an indication of giving up and not staying positive.  I did have her meet with Palliative Care as well.  I will see her back in 2 weeks after CT scans.  All questions were answered. The patient knows to call the clinic with any problems, questions or concerns. We can certainly see the patient much sooner if necessary.   This document serves as a record of services  personally performed by SAncil Linsey MD. It was created on her behalf by EArlyce Harman a trained medical scribe. The creation of this record is based on the scribe's personal observations and the provider's statements to them. This document has been checked and approved by the attending provider.  I have reviewed the above  documentation for accuracy and completeness, and I agree with the above.  This note was electronically signed.  Kelby Fam. [name], MD

## 2015-03-27 NOTE — Progress Notes (Signed)
Patient refuses to take phenergan,therefore not released from treatment plan. Tolerated chemo well. Ambulatory on discharge home with husband. Continuous infusion pump intact.

## 2015-03-27 NOTE — Patient Instructions (Signed)
[place] at [facility] Discharge Instructions  RECOMMENDATIONS MADE BY THE CONSULTANT AND ANY TEST RESULTS WILL BE SENT TO YOUR REFERRING PHYSICIAN.   Exam completed by Dr [name] today Chemotherapy if labs are good today Return Thursday to have pump removed  Chemotherapy in 2 weeks Scans before next cycle of chemotherapy  Return to see the doctor in 2 weeks Please call the clinic if you have any questions or concerns   Thank you for choosing [place] at [facility] to provide your oncology and hematology care.  To afford each patient quality time with our provider, please arrive at least 15 minutes before your scheduled appointment time.    You need to re-schedule your appointment should you arrive 10 or more minutes late.  We strive to give you quality time with our providers, and arriving late affects you and other patients whose appointments are after yours.  Also, if you no show three or more times for appointments you may be dismissed from the clinic at the providers discretion.     Again, thank you for choosing [facility].  Our hope is that these requests will decrease the amount of time that you wait before being seen by our physicians.       _____________________________________________________________  Should you have questions after your visit to [facility], please contact our office at [phone] between the hours of 8:30 a.m. and 4:30 p.m.  Voicemails left after 4:30 p.m. will not be returned until the following business day.  For prescription refill requests, have your pharmacy contact our office.

## 2015-03-29 ENCOUNTER — Telehealth (HOSPITAL_COMMUNITY): Payer: Self-pay | Admitting: Hematology & Oncology

## 2015-03-29 ENCOUNTER — Encounter (HOSPITAL_BASED_OUTPATIENT_CLINIC_OR_DEPARTMENT_OTHER): Payer: Commercial Managed Care - HMO

## 2015-03-29 ENCOUNTER — Encounter (HOSPITAL_COMMUNITY): Payer: Self-pay

## 2015-03-29 VITALS — BP 109/62 | HR 59 | Temp 97.9°F | Resp 16

## 2015-03-29 DIAGNOSIS — C787 Secondary malignant neoplasm of liver and intrahepatic bile duct: Secondary | ICD-10-CM | POA: Diagnosis not present

## 2015-03-29 DIAGNOSIS — Z452 Encounter for adjustment and management of vascular access device: Secondary | ICD-10-CM | POA: Diagnosis not present

## 2015-03-29 DIAGNOSIS — C189 Malignant neoplasm of colon, unspecified: Secondary | ICD-10-CM | POA: Diagnosis not present

## 2015-03-29 MED ORDER — GENERLAC 10 GM/15ML PO SOLN: 10.0000 g | Freq: Every day | ORAL | Status: AC | PRN

## 2015-03-29 MED ORDER — HEPARIN SOD (PORK) LOCK FLUSH 100 UNIT/ML IV SOLN
INTRAVENOUS | Status: AC
  Filled 2015-03-29: qty 5

## 2015-03-29 MED ORDER — SODIUM CHLORIDE 0.9 % IJ SOLN
10.0000 mL | INTRAMUSCULAR | Status: DC | PRN
  Administered 2015-03-29: 10 mL
  Filled 2015-03-29: qty 10

## 2015-03-29 MED ORDER — HEPARIN SOD (PORK) LOCK FLUSH 100 UNIT/ML IV SOLN
500.0000 [IU] | Freq: Once | INTRAVENOUS | Status: AC | PRN
  Administered 2015-03-29: 500 [IU]

## 2015-03-29 NOTE — Patient Instructions (Signed)
.  [place] at [facility] Discharge Instructions  RECOMMENDATIONS MADE BY THE CONSULTANT AND ANY TEST RESULTS WILL BE SENT TO YOUR REFERRING PHYSICIAN.  Home infusion pump removal and port flush today. Generlac refill sent to your pharmacy. Return as scheduled for chemotherapy and office visit. Please call the clinic should you have any questions or concerns.  Thank you for choosing [place] at [facility] to provide your oncology and hematology care.  To afford each patient quality time with our provider, please arrive at least 15 minutes before your scheduled appointment time.    You need to re-schedule your appointment should you arrive 10 or more minutes late.  We strive to give you quality time with our providers, and arriving late affects you and other patients whose appointments are after yours.  Also, if you no show three or more times for appointments you may be dismissed from the clinic at the providers discretion.     Again, thank you for choosing [facility].  Our hope is that these requests will decrease the amount of time that you wait before being seen by our physicians.       _____________________________________________________________  Should you have questions after your visit to [facility], please contact our office at [phone] between the hours of 8:30 a.m. and 4:30 p.m.  Voicemails left after 4:30 p.m. will not be returned until the following business day.  For prescription refill requests, have your pharmacy contact our office.

## 2015-03-29 NOTE — Telephone Encounter (Signed)
Sydney Morrison 02/12/15-02/12/16 [id number]

## 2015-03-29 NOTE — Progress Notes (Signed)
Sanjuan Dame presents to have home infusion pump d/c'd and for port-a-cath flush/deaccess.  Good blood return present. Portacath flushed with NS 82ml and 500U/66ml Heparin; needle removed intact.  Procedure tolerated well and without incident.

## 2015-04-06 ENCOUNTER — Ambulatory Visit (HOSPITAL_COMMUNITY)
Admission: RE | Admit: 2015-04-06 | Discharge: 2015-04-06 | Disposition: A | Discharge status: Home / Self Care | Payer: Commercial Managed Care - HMO | Source: Ambulatory Visit | Attending: Hematology & Oncology | Admitting: Hematology & Oncology

## 2015-04-06 DIAGNOSIS — C189 Malignant neoplasm of colon, unspecified: Secondary | ICD-10-CM | POA: Diagnosis not present

## 2015-04-06 DIAGNOSIS — C787 Secondary malignant neoplasm of liver and intrahepatic bile duct: Secondary | ICD-10-CM | POA: Diagnosis not present

## 2015-04-06 DIAGNOSIS — Z9049 Acquired absence of other specified parts of digestive tract: Secondary | ICD-10-CM | POA: Insufficient documentation

## 2015-04-06 DIAGNOSIS — Z9221 Personal history of antineoplastic chemotherapy: Secondary | ICD-10-CM | POA: Insufficient documentation

## 2015-04-06 DIAGNOSIS — Z933 Colostomy status: Secondary | ICD-10-CM | POA: Diagnosis not present

## 2015-04-06 DIAGNOSIS — Z08 Encounter for follow-up examination after completed treatment for malignant neoplasm: Secondary | ICD-10-CM | POA: Insufficient documentation

## 2015-04-06 MED ORDER — IOHEXOL 300 MG/ML  SOLN
100.0000 mL | Freq: Once | INTRAMUSCULAR | Status: AC | PRN
  Administered 2015-04-06: 100 mL via INTRAVENOUS

## 2015-04-10 ENCOUNTER — Encounter (HOSPITAL_COMMUNITY): Payer: Self-pay | Admitting: Hematology & Oncology

## 2015-04-10 ENCOUNTER — Other Ambulatory Visit (HOSPITAL_COMMUNITY): Payer: Self-pay | Admitting: Hematology & Oncology

## 2015-04-10 ENCOUNTER — Encounter (HOSPITAL_BASED_OUTPATIENT_CLINIC_OR_DEPARTMENT_OTHER): Payer: Commercial Managed Care - HMO | Admitting: Hematology & Oncology

## 2015-04-10 ENCOUNTER — Encounter (HOSPITAL_COMMUNITY): Payer: Commercial Managed Care - HMO | Attending: Hematology & Oncology

## 2015-04-10 VITALS — BP 117/63 | HR 70 | Temp 98.0°F | Resp 18 | Wt 101.2 lb

## 2015-04-10 VITALS — BP 115/56 | HR 54 | Temp 98.0°F | Resp 18

## 2015-04-10 DIAGNOSIS — C189 Malignant neoplasm of colon, unspecified: Secondary | ICD-10-CM

## 2015-04-10 DIAGNOSIS — C7911 Secondary malignant neoplasm of bladder: Secondary | ICD-10-CM

## 2015-04-10 DIAGNOSIS — C19 Malignant neoplasm of rectosigmoid junction: Secondary | ICD-10-CM

## 2015-04-10 DIAGNOSIS — D638 Anemia in other chronic diseases classified elsewhere: Secondary | ICD-10-CM

## 2015-04-10 DIAGNOSIS — L8990 Pressure ulcer of unspecified site, unspecified stage: Secondary | ICD-10-CM

## 2015-04-10 DIAGNOSIS — E876 Hypokalemia: Secondary | ICD-10-CM | POA: Diagnosis present

## 2015-04-10 DIAGNOSIS — C786 Secondary malignant neoplasm of retroperitoneum and peritoneum: Secondary | ICD-10-CM

## 2015-04-10 DIAGNOSIS — Z5111 Encounter for antineoplastic chemotherapy: Secondary | ICD-10-CM | POA: Diagnosis not present

## 2015-04-10 DIAGNOSIS — D6481 Anemia due to antineoplastic chemotherapy: Secondary | ICD-10-CM

## 2015-04-10 DIAGNOSIS — R112 Nausea with vomiting, unspecified: Secondary | ICD-10-CM | POA: Diagnosis present

## 2015-04-10 DIAGNOSIS — T451X5A Adverse effect of antineoplastic and immunosuppressive drugs, initial encounter: Secondary | ICD-10-CM

## 2015-04-10 DIAGNOSIS — C7989 Secondary malignant neoplasm of other specified sites: Secondary | ICD-10-CM

## 2015-04-10 DIAGNOSIS — C787 Secondary malignant neoplasm of liver and intrahepatic bile duct: Secondary | ICD-10-CM | POA: Insufficient documentation

## 2015-04-10 DIAGNOSIS — E43 Unspecified severe protein-calorie malnutrition: Secondary | ICD-10-CM

## 2015-04-10 DIAGNOSIS — D630 Anemia in neoplastic disease: Secondary | ICD-10-CM

## 2015-04-10 DIAGNOSIS — C785 Secondary malignant neoplasm of large intestine and rectum: Secondary | ICD-10-CM

## 2015-04-10 DIAGNOSIS — Z7189 Other specified counseling: Secondary | ICD-10-CM

## 2015-04-10 LAB — CBC WITH DIFFERENTIAL/PLATELET
Basophils Absolute: 0 10*3/uL (ref 0.0–0.1)
Basophils Relative: 1 %
EOS ABS: 0.3 10*3/uL (ref 0.0–0.7)
EOS PCT: 4 %
HCT: 29.4 % — ABNORMAL LOW (ref 36.0–46.0)
Hemoglobin: 9.2 g/dL — ABNORMAL LOW (ref 12.0–15.0)
LYMPHS ABS: 1.6 10*3/uL (ref 0.7–4.0)
Lymphocytes Relative: 20 %
MCH: 29.3 pg (ref 26.0–34.0)
MCHC: 31.3 g/dL (ref 30.0–36.0)
MCV: 93.6 fL (ref 78.0–100.0)
MONO ABS: 1.1 10*3/uL — AB (ref 0.1–1.0)
Monocytes Relative: 14 %
Neutro Abs: 5.1 10*3/uL (ref 1.7–7.7)
Neutrophils Relative %: 61 %
PLATELETS: 272 10*3/uL (ref 150–400)
RBC: 3.14 MIL/uL — AB (ref 3.87–5.11)
RDW: 15.2 % (ref 11.5–15.5)
WBC: 8.2 10*3/uL (ref 4.0–10.5)

## 2015-04-10 LAB — COMPREHENSIVE METABOLIC PANEL
ALT: 24 U/L (ref 14–54)
ANION GAP: 10 (ref 5–15)
AST: 28 U/L (ref 15–41)
Albumin: 3.4 g/dL — ABNORMAL LOW (ref 3.5–5.0)
Alkaline Phosphatase: 191 U/L — ABNORMAL HIGH (ref 38–126)
BUN: 18 mg/dL (ref 6–20)
CALCIUM: 9 mg/dL (ref 8.9–10.3)
CHLORIDE: 103 mmol/L (ref 101–111)
CO2: 25 mmol/L (ref 22–32)
Creatinine, Ser: 0.53 mg/dL (ref 0.44–1.00)
Glucose, Bld: 82 mg/dL (ref 65–99)
POTASSIUM: 4.1 mmol/L (ref 3.5–5.1)
SODIUM: 138 mmol/L (ref 135–145)
Total Bilirubin: 0.3 mg/dL (ref 0.3–1.2)
Total Protein: 7.4 g/dL (ref 6.5–8.1)

## 2015-04-10 MED ORDER — PROMETHAZINE HCL 25 MG/ML IJ SOLN
INTRAMUSCULAR | Status: AC
Start: 2015-04-10 — End: 2015-04-10
  Filled 2015-04-10: qty 1

## 2015-04-10 MED ORDER — PALONOSETRON HCL INJECTION 0.25 MG/5ML
INTRAVENOUS | Status: AC
  Filled 2015-04-10: qty 5

## 2015-04-10 MED ORDER — SODIUM CHLORIDE 0.9 % IV SOLN
Freq: Once | INTRAVENOUS | Status: AC
  Administered 2015-04-10: 10:00:00 via INTRAVENOUS

## 2015-04-10 MED ORDER — SODIUM CHLORIDE 0.9 % IV SOLN
2400.0000 mg/m2 | INTRAVENOUS | Status: DC
  Administered 2015-04-10: 3450 mg via INTRAVENOUS
  Filled 2015-04-10: qty 69

## 2015-04-10 MED ORDER — IRINOTECAN HCL CHEMO INJECTION 100 MG/5ML
180.0000 mg/m2 | Freq: Once | INTRAVENOUS | Status: AC
  Administered 2015-04-10: 260 mg via INTRAVENOUS
  Filled 2015-04-10: qty 4.33

## 2015-04-10 MED ORDER — PALONOSETRON HCL INJECTION 0.25 MG/5ML
0.2500 mg | Freq: Once | INTRAVENOUS | Status: AC
  Administered 2015-04-10: 0.25 mg via INTRAVENOUS

## 2015-04-10 MED ORDER — FOSAPREPITANT DIMEGLUMINE INJECTION 150 MG
Freq: Once | INTRAVENOUS | Status: AC
  Administered 2015-04-10: 11:00:00 via INTRAVENOUS
  Filled 2015-04-10: qty 5

## 2015-04-10 MED ORDER — LEUCOVORIN CALCIUM INJECTION 350 MG: 400.0000 mg/m2 | Freq: Once | INTRAMUSCULAR | Status: DC

## 2015-04-10 MED ORDER — ATROPINE SULFATE 1 MG/ML IJ SOLN
INTRAMUSCULAR | Status: AC
  Filled 2015-04-10: qty 1

## 2015-04-10 MED ORDER — ATROPINE SULFATE 1 MG/ML IJ SOLN
0.5000 mg | Freq: Once | INTRAMUSCULAR | Status: AC | PRN
  Administered 2015-04-10: 0.5 mg via INTRAVENOUS

## 2015-04-10 MED ORDER — LEUCOVORIN CALCIUM INJECTION 100 MG
20.0000 mg/m2 | Freq: Once | INTRAMUSCULAR | Status: AC
  Administered 2015-04-10: 30 mg via INTRAVENOUS
  Filled 2015-04-10: qty 1.5

## 2015-04-10 MED ORDER — PROMETHAZINE HCL 25 MG/ML IJ SOLN: 12.5000 mg | INTRAMUSCULAR | Status: DC | PRN

## 2015-04-10 MED ORDER — SODIUM CHLORIDE 0.9 % IJ SOLN: 10.0000 mL | INTRAMUSCULAR | Status: DC | PRN

## 2015-04-10 NOTE — Patient Instructions (Signed)
[place] at [facility] Discharge Instructions  RECOMMENDATIONS MADE BY THE CONSULTANT AND ANY TEST RESULTS WILL BE SENT TO YOUR REFERRING PHYSICIAN.  Exam and discussion completed by Dr. [name]. Mixed response to therapy based on CT scans:  Recommend change in treatment, clinical trial, or interventional targeted chemotherapy. Treatment as planned today.  Return tentatively in 2 weeks as scheduled for chemotherapy and office visit.    Thank you for choosing [place] at [facility] to provide your oncology and hematology care.  To afford each patient quality time with our provider, please arrive at least 15 minutes before your scheduled appointment time.    You need to re-schedule your appointment should you arrive 10 or more minutes late.  We strive to give you quality time with our providers, and arriving late affects you and other patients whose appointments are after yours.  Also, if you no show three or more times for appointments you may be dismissed from the clinic at the providers discretion.     Again, thank you for choosing [facility].  Our hope is that these requests will decrease the amount of time that you wait before being seen by our physicians.       _____________________________________________________________  Should you have questions after your visit to [facility], please contact our office at [phone] between the hours of 8:30 a.m. and 4:30 p.m.  Voicemails left after 4:30 p.m. will not be returned until the following business day.  For prescription refill requests, have your pharmacy contact our office.

## 2015-04-10 NOTE — Progress Notes (Signed)
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 Diffuse liver metastases   07/02/2014 Imaging Changes consistent with the known history of colon carcinoma with metastatic disease involving the liver and peritoneum. Recent stent placement within the colonic lesion. Some fecal material is noted within the proximal portion of the stent which a...   07/05/2014 Pathology Results Liver, needle/core biopsy, left lobe - METASTATIC ADENOCARCINOMA   07/19/2014 - 08/16/2014 Chemotherapy FOLFOX   07/23/2014 Imaging Similar findings of advanced stage IV metastatic colon cancer with perhaps slight interval progression of disease in the liver.   08/25/2014 Treatment Plan Change Chemotherapy held due to hospitalization   08/25/2014 - 09/21/2014 Hospital Admission SBO   08/26/2014 Imaging High-grade distal small bowel obstruction, likely from peritoneal metastatic disease.  Extensive hepatic metastatic disease. The largest metastasis in the left lobe has decreased from 07/23/2014.   08/31/2014 Surgery Colon, segmental resection for tumor, rectum - INVASIVE ADENOCARCINOMA, MODERATELY DIFFERENTIATED - TUMOR INVADES THROUGH SEROSA, SEE COMMENT. - PERFORATION WITH VISIBLE STENT. - LYMPHOVASCULAR AND PERINEURAL INVASION IS PRESENT. - DISTAL AND PROX...   09/15/2014 Imaging No bowel obstruction suspected status post multifocal small and large bowel resection with both primary reanastomoses and descending colostomy. Patulous appearance of colon in the right abdomen might reflect focal ileus.   11/06/2014 - 01/17/2015 Chemotherapy FOLFOX   11/28/2014 - 11/30/2014 Hospital Admission 1.   Nausea and vomiting 2.     Constipation    01/18/2015 - 01/22/2015 Hospital Admission Nausea and vomiting, UTI (lower urinary tract infection)   01/29/2015 Progression CT CAP- Progressive hepatic metastatic disease. New and enlarging heterogeneous nodules and masses in the liver measure up to 5.2 x 6.7 cm (image 56), previously 3.3 x 5.1 cm on 09/15/2014.   02/12/2015 -  Chemotherapy FOLFIRI, patient refuses Avastin.  5FU bolus deleted without administration beginning on cycle 1.   04/06/2015 Imaging CT C/A/P mixed response with decreased size of several hepatic lesions, increased size of others, dominant lesion in RL of liver 8.1x5.2x8.4cm, large serosal implant overlying RL of liver appears increased    CURRENT THERAPY: FOLFIRI  INTERVAL HISTORY: Sydney Morrison 60 y.o. 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 Michael Boston on 08/31/2014 when she was admitted with SBO.   Sydney Morrison is accompanied by her husband and here to discuss her recent imaging studies. I have discussed with the patient her mixed response shown on the CT scans.  The patient is interested in proton beam therapy to her liver. I discussed with her that proton beam therapy is not widely used for liver or colon cancer. I offered setting up a consultation with a radiation oncologist if she would like to discuss this further with them. The patient is okay without a referral, stating "I trust you".   Denies any issues walking or stumbling. Reports a rash began when she took a recent antibiotic, clindamycin, after having her tooth fixed. Stating her rash has improved. Denies issues with ostomy. She had delayed diarrhea at 2 o'clock on Thursday but did  not want to take imodium and let it run until Friday secondary to having to drink her barium for her CT  scans.  Her bowels have returned to normal.    Past Medical History  Diagnosis Date  . Crohn's disease ([place])   . Chronic headache   . Cancer ([place])   . Nausea and vomiting 08/25/2014  . 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 [id number]; Chronic blood loss anemia; Antineoplastic chemotherapy induced anemia; Dehydration; Chemotherapy-induced neuropathy ([place]); Nausea and vomiting; SBO (small bowel obstruction) ([place]); Atrial flutter ([place]); Goals of care, counseling/discussion; Screen for STD (sexually transmitted disease); Pressure ulcer, stage 1; Postoperative fever; Leucocytosis; Protein-calorie malnutrition, severe ([place]); Tobacco abuse; Encounter for palliative care; Hyponatremia; Intolerance, food ([place]); Intractable nausea and vomiting; Constipation; Anemia of chronic disease; Thrombocytopenia ([place]); and UTI (lower urinary tract infection) on her problem list.     is allergic to avelox; codeine; dextromethorphan; erythromycin; penicillins; percocet; reglan; zofran; clindamycin hcl; red dye; and suprep.  Current Outpatient Prescriptions on File Prior to Visit  Medication Sig Dispense Refill  . acetaminophen (TYLENOL) 325 MG tablet Take 325 mg by mouth every 6 (six) hours as needed for fever or headache (headahce).     . ALPRAZolam (XANAX) 0.5 MG tablet Take 0.125-0.5 mg by mouth at bedtime as needed for sleep.     . benzocaine (ZILACTIN-B) 10 % mucosal gel Use as directed 1 application in the mouth or throat as needed for mouth pain. Reported on 03/27/2015    . cromolyn (NASALCROM) 5.2 MG/ACT nasal spray Place 1 spray into both nostrils at bedtime.    . Cyanocobalamin (VITAMIN B-12 CR) 1500 MCG TBCR Take 1 tablet by mouth daily.    Marland Kitchen dexamethasone (DECADRON) 4 MG tablet Take 1 tablet (4 mg total) by mouth 2 (two) times daily. For 2 days. Begin day of pump disconnect. (Patient not taking: Reported on 03/27/2015) 8 tablet 2  . diphenoxylate-atropine (LOMOTIL) 2.5-0.025 MG  tablet May take 1-2 tablets four times a day as needed for loose stools. (Patient not taking: Reported on 03/12/2015) 45 tablet 0  . ENSURE (ENSURE) Take 237 mLs by mouth 2 (two) times daily with a meal.    . Fluorouracil (ADRUCIL IV) Inject into the vein every 14 (fourteen) days.    Burnadette Peter OIL Apply 1 application topically daily.    Marland Kitchen GENERLAC 10 GM/15ML SOLN Take 15 mLs (10 g total) by mouth daily as needed (for constipation). 2700 mL 2  . LEUCOVORIN CALCIUM IV Inject into the vein. Reported on 03/27/2015    . lidocaine-prilocaine (EMLA) cream Apply 1 application topically as needed. Apply to portacath site 1-2 hours prior to use 30 g 3  . loperamide (IMODIUM A-D) 2 MG tablet Take 2 mg by mouth 4 (four) times daily as needed for diarrhea or loose stools.    Marland Kitchen LORazepam (ATIVAN) 0.5 MG tablet Take 1 tablet (0.5 mg total) by mouth every 6 (six) hours as needed (nausea/vomiting). May take SL if concern for vomiting exists 45 tablet 2  . Multiple Vitamins-Minerals (MULTIVITAMIN WITH MINERALS) tablet Take 1 tablet by mouth daily.    . nicotine (NICODERM CQ - DOSED IN MG/24 HR) 7 mg/24hr patch Place 7 mg onto the skin daily.     Current Facility-Administered Medications on File Prior to Visit  Medication Dose Route Frequency Provider Last Rate Last Dose  . atropine injection 0.5 mg  0.5 mg Intravenous Once PRN Patrici Ranks, MD  0.5 mg at 02/12/15 1001  . atropine injection 0.5 mg  0.5 mg Intravenous Once PRN Patrici Ranks, MD      . fluorouracil (ADRUCIL) 3,450 mg in sodium chloride 0.9 % 81 mL chemo infusion  2,400 mg/m2 (Treatment Plan Actual) Intravenous 1 day or 1 dose Patrici Ranks, MD      . fosaprepitant (EMEND) 150 mg, dexamethasone (DECADRON) 12 mg in sodium chloride 0.9 % 145 mL IVPB   Intravenous Once Patrici Ranks, MD      . heparin lock flush 100 unit/mL  500 Units Intracatheter Once PRN Patrici Ranks, MD      . irinotecan (CAMPTOSAR) 260 mg in dextrose 5 %  500 mL chemo infusion  180 mg/m2 (Treatment Plan Actual) Intravenous Once Patrici Ranks, MD      . leucovorin injection 30 mg  20 mg/m2 Intravenous Once Patrici Ranks, MD      . promethazine (PHENERGAN) injection 12.5 mg  12.5 mg Intravenous Q4H PRN Patrici Ranks, MD   12.5 mg at 02/12/15 1000  . promethazine (PHENERGAN) injection 12.5 mg  12.5 mg Intravenous Q4H PRN Patrici Ranks, MD      . sodium chloride 0.9 % injection 10 mL  10 mL Intracatheter PRN Patrici Ranks, MD      . sodium chloride 0.9 % injection 10 mL  10 mL Intracatheter PRN Patrici Ranks, MD        Past Surgical History  Procedure Laterality Date  . Breast surgery    . Uterine fibroid surgery    . Colonoscopy w/ biopsies  06/29/2014    DR HUNG  . Flexible sigmoidoscopy N/A 06/30/2014    Procedure: FLEXIBLE SIGMOIDOSCOPY;  Surgeon: Carol Ada, MD;  Location: [facility] ENDOSCOPY;  Service: Endoscopy;  Laterality: N/A;  . Colonic stent placement N/A 06/30/2014    Procedure: COLONIC STENT PLACEMENT;  Surgeon: Carol Ada, MD;  Location: [facility] ENDOSCOPY;  Service: Endoscopy;  Laterality: N/A;  with fluro   . Portacath placement N/A 07/04/2014    Procedure: POWER PORT PLACEMENT;  Surgeon: Alphonsa Overall, MD;  Location: [place];  Service: General;  Laterality: N/A;  . Laparotomy N/A 08/31/2014    Procedure: EXPLORATORY LAPAROTOMY;  Surgeon: Michael Boston, MD;  Location: [facility];  Service: General;  Laterality: N/A;  . Bowel resection N/A 08/31/2014    Procedure: SMALL BOWEL RESECTION;  Surgeon: Michael Boston, MD;  Location: [facility];  Service: General;  Laterality: N/A;  . Colostomy N/A 08/31/2014    Procedure: low anterior resection with COLOSTOMY;  Surgeon: Michael Boston, MD;  Location: [facility];  Service: General;  Laterality: N/A;  . Appendectomy  08/31/2014    Procedure: APPENDECTOMY;  Surgeon: Michael Boston, MD;  Location: [facility];  Service: General;;  . Supracervical abdominal hysterectomy  08/31/2014    Procedure: HYSTERECTOMY  SUPRACERVICAL ABDOMINAL BILATERAL TUBES AND OVARIES;  Surgeon: Michael Boston, MD;  Location: [facility];  Service: General;;  . Transverse colon resection  08/31/2014    Procedure: TRANSVERSE COLON RESECTION;  Surgeon: Michael Boston, MD;  Location: [facility];  Service: General;;  . Debulking  08/31/2014    Procedure: DEBULKING OF PERITONEUM;  Surgeon: Michael Boston, MD;  Location: [facility];  Service: General;;    Denies any headaches, dizziness, double vision, chills, night sweats, nausea, constipation, chest pain, heart palpitations, shortness of breath, blood in stool, black tarry stool, urinary pain, urinary burning, urinary frequency, hematuria. Positive for rash.    Rash  attributed to new antibiotic, erythromycin.  14 point review of systems was performed and is negative except as detailed under history of present illness and above  PHYSICAL EXAMINATION ECOG PERFORMANCE STATUS: 2 - Symptomatic, <50% confined to bed  Filed Vitals:   04/10/15 0853  BP: 117/63  Pulse: 70  Temp: 98 F (36.7 C)  Resp: 18    Filed Weights   04/10/15 0853  Weight: 101 lb 3.2 oz (45.904 kg)    GENERAL:alert, no distress, cachectic, cooperative, smiling and accompanied by her husband, Sydney Morrison.  SKIN: skin color, texture, turgor are normal HEAD: Normocephalic, No masses, lesions, tenderness or abnormalities EYES: normal, PERRLA, EOMI, Conjunctiva are pink and non-injected EARS: External ears normal OROPHARYNX:lips, buccal mucosa, and tongue normal and mucous membranes are moist  NECK: supple, trachea midline LYMPH:  No palpable adenopathy in the neck, supraclavicular region or axillae BREAST:not examined LUNGS: clear to auscultation and percussion HEART: regular rate & rhythm, no murmurs, no gallops, S1 normal and S2 normal ABDOMEN:abdomen soft, non-tender, normal bowel sounds and ostomy noted.  Small stomal hernia. Very thin BACK: Back symmetric, no curvature, sacral area with mild erythema, no active skin  breakdown EXTREMITIES:less then 2 second capillary refill, no joint deformities, effusion, or inflammation, no skin discoloration, no cyanosis  NEURO: alert & oriented x 3 with fluent speech, no focal motor/sensory deficits   LABORATORY DATA: I have reviewed the data below as listed.  CBC    Component Value Date/Time   WBC 8.2 04/10/2015 0839   WBC 5.5 08/16/2014 1037   RBC 3.14* 04/10/2015 0839   RBC 2.71* 09/12/2014 0515   RBC 3.61* 08/16/2014 1037   HGB 9.2* 04/10/2015 0839   HGB 9.6* 08/16/2014 1037   HCT 29.4* 04/10/2015 0839   HCT 30.9* 08/16/2014 1037   PLT 272 04/10/2015 0839   PLT 167 08/16/2014 1037   MCV 93.6 04/10/2015 0839   MCV 85.6 08/16/2014 1037   MCH 29.3 04/10/2015 0839   MCH 26.6 08/16/2014 1037   MCHC 31.3 04/10/2015 0839   MCHC 31.1* 08/16/2014 1037   RDW 15.2 04/10/2015 0839   RDW 16.1* 08/16/2014 1037   LYMPHSABS 1.6 04/10/2015 0839   LYMPHSABS 1.2 08/16/2014 1037   MONOABS 1.1* 04/10/2015 0839   MONOABS 0.9 08/16/2014 1037   EOSABS 0.3 04/10/2015 0839   EOSABS 0.2 08/16/2014 1037   BASOSABS 0.0 04/10/2015 0839   BASOSABS 0.0 08/16/2014 1037      Chemistry      Component Value Date/Time   NA 138 04/10/2015 0839   NA 140 08/16/2014 1037   K 4.1 04/10/2015 0839   K 4.2 08/16/2014 1037   CL 103 04/10/2015 0839   CO2 25 04/10/2015 0839   CO2 28 08/16/2014 1037   BUN 18 04/10/2015 0839   BUN 23.1 08/16/2014 1037   CREATININE 0.53 04/10/2015 0839   CREATININE 0.7 08/16/2014 1037      Component Value Date/Time   CALCIUM 9.0 04/10/2015 0839   CALCIUM 9.5 08/16/2014 1037   ALKPHOS 191* 04/10/2015 0839   ALKPHOS 143 08/16/2014 1037   AST 28 04/10/2015 0839   AST 21 08/16/2014 1037   ALT 24 04/10/2015 0839   ALT 15 08/16/2014 1037   BILITOT 0.3 04/10/2015 0839   BILITOT 0.28 08/16/2014 1037     Lab Results  Component Value Date   CEA 16.2* 03/12/2015    PATHOLOGY:  Diagnosis 1. Soft tissue mass, biopsy, peritoneal mass dome of  bladder - METASTATIC ADENOCARCINOMA. 2. Appendix,  Incidental - METASTATIC ADENOCARCINOMA. 3. Small intestine, resection, ileum - METASTATIC ADENOCARCINOMA. - RESECTION MARGINS ARE NEGATIVE. 4. Colon, segmental resection, with omentum, transverse - METASTATIC ADENOCARCINOMA INVOLVING COLON AND OMENTUM. - RESECTION MARGINS ARE NEGATIVE. 5. Soft tissue, biopsy, right pelvic sidewall nodule - METASTATIC ADENOCARCINOMA. 6. Soft tissue mass, biopsy, anterior peritoneum mass near dome - METASTATIC ADENOCARCINOMA. 7. Colon, segmental resection for tumor, rectum - INVASIVE ADENOCARCINOMA, MODERATELY DIFFERENTIATED - TUMOR INVADES THROUGH SEROSA, SEE COMMENT. - PERFORATION WITH VISIBLE STENT. - LYMPHOVASCULAR AND PERINEURAL INVASION IS PRESENT. - DISTAL AND PROXIMAL RESECTION MARGINS ARE NEGATIVE, SEE COMMENT FOR RADIAL MARGIN. - METASTATIC CARCINOMA IN FOUR OF FOURTEEN LYMPH NODES (4/14). - SEROSAL METASTATIC NODULES AND SATELLITE NODULES PRESENT. - SEE ONCOLOGY TABLE AND COMMENT.    RADIOLOGY: I have personally reviewed the radiological images as listed and agreed with the findings in the report.  CLINICAL DATA: 60 year old female with history of metastatic colon cancer, status post surgical resection and chemotherapy. Additional history of Crohn's disease.  EXAM: CT CHEST, ABDOMEN, AND PELVIS WITH CONTRAST  TECHNIQUE: Multidetector CT imaging of the chest, abdomen and pelvis was performed following the standard protocol during bolus administration of intravenous contrast.  CONTRAST: 124m OMNIPAQUE IOHEXOL 300 MG/ML SOLN  COMPARISON: CT of the chest, abdomen and pelvis 01/29/2015.  FINDINGS: CT CHEST FINDINGS  Mediastinum/Lymph Nodes: Heart size is normal. There is no significant pericardial fluid, thickening or pericardial calcification. No pathologically enlarged mediastinal or hilar lymph nodes. 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 Esophagus is unremarkable in appearance. No axillary lymphadenopathy.  Lungs/Pleura: 4 mm subpleural nodule in the posterior aspect of the left lower lobe (image 30 of series 6), unchanged compared to prior study 06/29/2014, presumably benign, likely a subpleural lymph node. Likewise, a 3 mm subpleural nodule in the left lower lobe (image 51 of series 6) is also unchanged compared to prior study 06/29/2014, also likely a subpleural lymph node. No other suspicious appearing pulmonary nodules or masses are otherwise noted. Mild linear scarring in the base of the left lower lobe. No acute consolidative airspace disease. No pleural effusions.  Musculoskeletal/Soft Tissues: There are no aggressive appearing lytic or blastic lesions noted in the visualized portions of the skeleton.  CT ABDOMEN AND PELVIS FINDINGS  Hepatobiliary: Again noted are numerous hypovascular lesions scattered throughout the liver, compatible with widespread metastatic disease to the liver. Although several of the smaller lesions appear smaller than the prior examination and overall appear less numerous, suggesting some positive response to therapy, other lesions have clearly enlarged compared to the prior examination. A specific example of a shrinking lesion is a 2.3 x 3.8 x 3.3 cm lesion at the junction of segments 2 and 3 (image 62 of series 2), which previously measured 2.5 x 4.0 x 3.5 cm on prior study 01/29/2015. The largest lesion in the central aspect of the right lobe of the liver has enlarged and currently measures 8.1 x 5.2 x 8.4 cm (image 60 of series 2 and coronal image 31 of series 3), previously 6.7 x 5.2 x 7.2 cm on 01/29/2015. Additionally, several satellite lesions to this are increased in size, most notably a 2.8 x 2.2 cm lesion in segment 5 (image 70 of series 2). No intra or extrahepatic biliary duct of dilatation. Gallbladder is normal in appearance. In  addition, there is an enlarging 1.6 x 5.0 cm low-attenuation lesion associated with the liver capsule overlying segments 5 and 8 (image 59 of series 2) which is more evident  than on prior studies, likely to represent a serosal implant.  Pancreas: No pancreatic mass. No pancreatic ductal dilatation. No pancreatic or peripancreatic fluid or inflammatory changes.  Spleen: Unremarkable.  Adrenals/Urinary Tract: Bilateral adrenal glands and bilateral kidneys are normal in appearance. No hydroureteronephrosis. Urinary bladder is normal in appearance.  Stomach/Bowel: Normal appearance of the stomach. Status post low anterior resection with left lower quadrant colostomy. Additionally, there is a suture line in the proximal transverse colon, suggesting prior partial colectomy in this region as well. No pathologic dilatation of small bowel or colon. Status post omentectomy.  Vascular/Lymphatic: Atherosclerotic calcifications are noted throughout the abdominal and pelvic vasculature, without evidence of aneurysm or dissection. No lymphadenopathy noted in the abdomen or pelvis.  Reproductive: Status post hysterectomy. Ovaries are not confidently identified may be surgically absent or atrophic.  Other: No significant volume of ascites. No pneumoperitoneum.  Musculoskeletal: There are no aggressive appearing lytic or blastic lesions noted in the visualized portions of the skeleton.  IMPRESSION: 1. Today's study demonstrates a mixed response to therapy with decreased size of several hepatic metastases, but increased size of others, including the dominant lesion in the right lobe of the liver which currently measures 8.1 x 5.2 x 8.4 cm. Additionally, a large serosal implant overlying the right lobe of the liver also appears increased in size compared to the prior examination. 2. No new signs of metastatic disease elsewhere in the chest, abdomen or pelvis. 3. Status post low  anterior resection, omentectomy, partial transverse colectomy and left lower quadrant colostomy. 4. Additional findings, as above, similar prior studies.   Electronically Signed  By: Vinnie Langton M.D.  On: 04/06/2015 14:29  ASSESSMENT AND PLAN:  Stage IV CRC Cachexia Pressure wound -- well healing Anemia Kras mutation  Her anemia is chronic and secondary to disease, malnutrition and treatment we will continue to monitor this. I have encouraged ongoing improvement in her nutritional status.  She does not need refills.   I have reviewed CT imaging with the patient and her husband and presented multiple options: 1. Adding Avastin, she has been opposed to this in the past 2. Stivarga 3. Trifluridine/Tipiracil 4. Liver directed therapy  I have sent a note to Dr. Laurence Ferrari and he will meet with the patient to discuss liver directed therapy.  She is still a full code. She finds end of life discussions defeating and as an indication of giving up and not staying positive.  I did have her meet with Palliative Care as well.   She does not need any refills at this time.  We have scheduled her tentatively to return for treatment in 2 weeks.   All questions were answered. The patient knows to call the clinic with any problems, questions or concerns. We can certainly see the patient much sooner if necessary.   This document serves as a record of services personally performed by Ancil Linsey, MD. It was created on her behalf by Arlyce Harman, a trained medical scribe. The creation of this record is based on the scribe's personal observations and the provider's statements to them. This document has been checked and approved by the attending provider.  I have reviewed the above documentation for accuracy and completeness, and I agree with the above.  This note was electronically signed.  Kelby Fam. [name], MD

## 2015-04-10 NOTE — Progress Notes (Signed)
Patient refused phenergan today.  Education was given regarding the purpose of this medication, but she continues to refuse.  Patient tolerated infusion well.  VSS.

## 2015-04-10 NOTE — Patient Instructions (Signed)
[facility] Discharge Instructions for Patients Receiving Chemotherapy  Today you received the following chemotherapy: Irinotecan, 49fu, and leucovorin.      If you develop nausea and vomiting, or diarrhea that is not controlled by your medication, call the clinic.  The clinic phone number is [phone]. Office hours are Monday-Friday 8:30am-5:00pm.  BELOW ARE SYMPTOMS THAT SHOULD BE REPORTED IMMEDIATELY:  *FEVER GREATER THAN 101.0 F  *CHILLS WITH OR WITHOUT FEVER  NAUSEA AND VOMITING THAT IS NOT CONTROLLED WITH YOUR NAUSEA MEDICATION  *UNUSUAL SHORTNESS OF BREATH  *UNUSUAL BRUISING OR BLEEDING  TENDERNESS IN MOUTH AND THROAT WITH OR WITHOUT PRESENCE OF ULCERS  *URINARY PROBLEMS  *BOWEL PROBLEMS  UNUSUAL RASH Items with * indicate a potential emergency and should be followed up as soon as possible. If you have an emergency after office hours please contact your primary care physician or go to the nearest emergency department.  Please call the clinic during office hours if you have any questions or concerns.   You may also contact the Patient Navigator at [phone] should you have any questions or need assistance in obtaining follow up care.

## 2015-04-11 LAB — CEA: CEA: 20.2 ng/mL — ABNORMAL HIGH (ref 0.0–4.7)

## 2015-04-12 ENCOUNTER — Encounter (HOSPITAL_BASED_OUTPATIENT_CLINIC_OR_DEPARTMENT_OTHER): Payer: Commercial Managed Care - HMO

## 2015-04-12 VITALS — BP 104/49 | HR 62 | Temp 98.1°F | Resp 20

## 2015-04-12 DIAGNOSIS — Z452 Encounter for adjustment and management of vascular access device: Secondary | ICD-10-CM | POA: Diagnosis not present

## 2015-04-12 DIAGNOSIS — C19 Malignant neoplasm of rectosigmoid junction: Secondary | ICD-10-CM

## 2015-04-12 DIAGNOSIS — C787 Secondary malignant neoplasm of liver and intrahepatic bile duct: Principal | ICD-10-CM

## 2015-04-12 DIAGNOSIS — C189 Malignant neoplasm of colon, unspecified: Secondary | ICD-10-CM

## 2015-04-12 MED ORDER — SODIUM CHLORIDE 0.9 % IJ SOLN
10.0000 mL | INTRAMUSCULAR | Status: DC | PRN
  Administered 2015-04-12: 10 mL
  Filled 2015-04-12: qty 10

## 2015-04-12 MED ORDER — HEPARIN SOD (PORK) LOCK FLUSH 100 UNIT/ML IV SOLN
INTRAVENOUS | Status: AC
  Filled 2015-04-12: qty 5

## 2015-04-12 MED ORDER — HEPARIN SOD (PORK) LOCK FLUSH 100 UNIT/ML IV SOLN
500.0000 [IU] | Freq: Once | INTRAVENOUS | Status: AC | PRN
  Administered 2015-04-12: 500 [IU]

## 2015-04-12 NOTE — Patient Instructions (Signed)
[place] at [facility] Discharge Instructions  RECOMMENDATIONS MADE BY THE CONSULTANT AND ANY TEST RESULTS WILL BE SENT TO YOUR REFERRING PHYSICIAN.  Discontinued continuous infusion pump as scheduled. Return as scheduled.  Thank you for choosing [place] at [facility] to provide your oncology and hematology care.  To afford each patient quality time with our provider, please arrive at least 15 minutes before your scheduled appointment time.    You need to re-schedule your appointment should you arrive 10 or more minutes late.  We strive to give you quality time with our providers, and arriving late affects you and other patients whose appointments are after yours.  Also, if you no show three or more times for appointments you may be dismissed from the clinic at the providers discretion.     Again, thank you for choosing [facility].  Our hope is that these requests will decrease the amount of time that you wait before being seen by our physicians.       _____________________________________________________________  Should you have questions after your visit to [facility], please contact our office at [phone] between the hours of 8:30 a.m. and 4:30 p.m.  Voicemails left after 4:30 p.m. will not be returned until the following business day.  For prescription refill requests, have your pharmacy contact our office.

## 2015-04-12 NOTE — Progress Notes (Signed)
Asked patient how she was today and how she did with chemo. Patient reports with a smile "just great". Patient denies any side effects post chemo. Discontinued continuous infusion pump. Flushed port per protocol. Ambulatory on discharge home with husband.

## 2015-04-19 ENCOUNTER — Ambulatory Visit
Admission: RE | Admit: 2015-04-19 | Discharge: 2015-04-19 | Disposition: A | Discharge status: Home / Self Care | Payer: Commercial Managed Care - HMO | Source: Ambulatory Visit | Attending: Hematology & Oncology | Admitting: Hematology & Oncology

## 2015-04-19 DIAGNOSIS — C189 Malignant neoplasm of colon, unspecified: Secondary | ICD-10-CM

## 2015-04-19 DIAGNOSIS — C787 Secondary malignant neoplasm of liver and intrahepatic bile duct: Principal | ICD-10-CM

## 2015-04-24 ENCOUNTER — Encounter (HOSPITAL_COMMUNITY): Payer: Self-pay | Admitting: Hematology & Oncology

## 2015-04-24 ENCOUNTER — Encounter (HOSPITAL_BASED_OUTPATIENT_CLINIC_OR_DEPARTMENT_OTHER): Payer: Commercial Managed Care - HMO | Admitting: Hematology & Oncology

## 2015-04-24 ENCOUNTER — Encounter (HOSPITAL_BASED_OUTPATIENT_CLINIC_OR_DEPARTMENT_OTHER): Payer: Commercial Managed Care - HMO

## 2015-04-24 VITALS — BP 107/55 | HR 61 | Temp 98.3°F | Resp 16 | Wt 102.0 lb

## 2015-04-24 DIAGNOSIS — Z5111 Encounter for antineoplastic chemotherapy: Secondary | ICD-10-CM | POA: Diagnosis not present

## 2015-04-24 DIAGNOSIS — C189 Malignant neoplasm of colon, unspecified: Secondary | ICD-10-CM

## 2015-04-24 DIAGNOSIS — C787 Secondary malignant neoplasm of liver and intrahepatic bile duct: Secondary | ICD-10-CM

## 2015-04-24 DIAGNOSIS — D638 Anemia in other chronic diseases classified elsewhere: Secondary | ICD-10-CM | POA: Diagnosis not present

## 2015-04-24 DIAGNOSIS — E43 Unspecified severe protein-calorie malnutrition: Secondary | ICD-10-CM

## 2015-04-24 DIAGNOSIS — D6481 Anemia due to antineoplastic chemotherapy: Secondary | ICD-10-CM

## 2015-04-24 DIAGNOSIS — T451X5A Adverse effect of antineoplastic and immunosuppressive drugs, initial encounter: Secondary | ICD-10-CM

## 2015-04-24 LAB — CBC WITH DIFFERENTIAL/PLATELET
BASOS ABS: 0 10*3/uL (ref 0.0–0.1)
Basophils Relative: 0 %
EOS ABS: 0.3 10*3/uL (ref 0.0–0.7)
Eosinophils Relative: 3 %
HCT: 29.9 % — ABNORMAL LOW (ref 36.0–46.0)
Hemoglobin: 9.2 g/dL — ABNORMAL LOW (ref 12.0–15.0)
LYMPHS ABS: 1.8 10*3/uL (ref 0.7–4.0)
Lymphocytes Relative: 17 %
MCH: 28.3 pg (ref 26.0–34.0)
MCHC: 30.8 g/dL (ref 30.0–36.0)
MCV: 92 fL (ref 78.0–100.0)
Monocytes Absolute: 1.6 10*3/uL — ABNORMAL HIGH (ref 0.1–1.0)
Monocytes Relative: 15 %
NEUTROS PCT: 65 %
Neutro Abs: 7.1 10*3/uL (ref 1.7–7.7)
PLATELETS: 276 10*3/uL (ref 150–400)
RBC: 3.25 MIL/uL — AB (ref 3.87–5.11)
RDW: 15.3 % (ref 11.5–15.5)
WBC: 10.9 10*3/uL — AB (ref 4.0–10.5)

## 2015-04-24 LAB — COMPREHENSIVE METABOLIC PANEL
ALT: 37 U/L (ref 14–54)
ANION GAP: 9 (ref 5–15)
AST: 38 U/L (ref 15–41)
Albumin: 3.4 g/dL — ABNORMAL LOW (ref 3.5–5.0)
Alkaline Phosphatase: 229 U/L — ABNORMAL HIGH (ref 38–126)
BUN: 19 mg/dL (ref 6–20)
CHLORIDE: 101 mmol/L (ref 101–111)
CO2: 26 mmol/L (ref 22–32)
Calcium: 8.9 mg/dL (ref 8.9–10.3)
Creatinine, Ser: 0.59 mg/dL (ref 0.44–1.00)
Glucose, Bld: 83 mg/dL (ref 65–99)
POTASSIUM: 3.9 mmol/L (ref 3.5–5.1)
Sodium: 136 mmol/L (ref 135–145)
Total Bilirubin: 0.5 mg/dL (ref 0.3–1.2)
Total Protein: 7.4 g/dL (ref 6.5–8.1)

## 2015-04-24 MED ORDER — ATROPINE SULFATE 1 MG/ML IJ SOLN
0.5000 mg | Freq: Once | INTRAMUSCULAR | Status: AC | PRN
  Administered 2015-04-24: 0.5 mg via INTRAVENOUS
  Filled 2015-04-24: qty 1

## 2015-04-24 MED ORDER — LEUCOVORIN CALCIUM INJECTION 100 MG
20.0000 mg/m2 | Freq: Once | INTRAMUSCULAR | Status: AC
  Administered 2015-04-24: 30 mg via INTRAVENOUS
  Filled 2015-04-24: qty 1.5

## 2015-04-24 MED ORDER — FOSAPREPITANT DIMEGLUMINE INJECTION 150 MG
Freq: Once | INTRAVENOUS | Status: AC
  Administered 2015-04-24: 10:00:00 via INTRAVENOUS
  Filled 2015-04-24: qty 5

## 2015-04-24 MED ORDER — IRINOTECAN HCL CHEMO INJECTION 100 MG/5ML
180.0000 mg/m2 | Freq: Once | INTRAVENOUS | Status: AC
  Administered 2015-04-24: 260 mg via INTRAVENOUS
  Filled 2015-04-24: qty 4.33

## 2015-04-24 MED ORDER — LEUCOVORIN CALCIUM INJECTION 350 MG: 400.0000 mg/m2 | Freq: Once | INTRAVENOUS | Status: DC

## 2015-04-24 MED ORDER — PALONOSETRON HCL INJECTION 0.25 MG/5ML
0.2500 mg | Freq: Once | INTRAVENOUS | Status: AC
Start: 2015-04-24 — End: 2015-04-24
  Administered 2015-04-24: 0.25 mg via INTRAVENOUS

## 2015-04-24 MED ORDER — SODIUM CHLORIDE 0.9 % IJ SOLN
10.0000 mL | INTRAMUSCULAR | Status: DC | PRN
  Administered 2015-04-24: 10 mL
  Filled 2015-04-24: qty 10

## 2015-04-24 MED ORDER — SODIUM CHLORIDE 0.9 % IV SOLN
2400.0000 mg/m2 | INTRAVENOUS | Status: DC
  Administered 2015-04-24: 3450 mg via INTRAVENOUS
  Filled 2015-04-24: qty 69

## 2015-04-24 MED ORDER — SODIUM CHLORIDE 0.9 % IV SOLN
Freq: Once | INTRAVENOUS | Status: AC
  Administered 2015-04-24: 10:00:00 via INTRAVENOUS

## 2015-04-24 MED ORDER — PALONOSETRON HCL INJECTION 0.25 MG/5ML
INTRAVENOUS | Status: AC
  Filled 2015-04-24: qty 5

## 2015-04-24 NOTE — Progress Notes (Signed)
..  Sydney Morrison arrived to clinic for chemo. Labs obtained and shown to  Dr. [name], Dr. [name] also saw patient today. Plan signed and chemo released. Tolerated well

## 2015-04-24 NOTE — Progress Notes (Signed)
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 Diffuse liver metastases   07/02/2014 Imaging Changes consistent with the known history of colon carcinoma with metastatic disease involving the liver and peritoneum. Recent stent placement within the colonic lesion. Some fecal material is noted within the proximal portion of the stent which a...   07/05/2014 Pathology Results Liver, needle/core biopsy, left lobe - METASTATIC ADENOCARCINOMA   07/19/2014 - 08/16/2014 Chemotherapy FOLFOX   07/23/2014 Imaging Similar findings of advanced stage IV metastatic colon cancer with perhaps slight interval progression of disease in the liver.   08/25/2014 Treatment Plan Change Chemotherapy held due to hospitalization   08/25/2014 - 09/21/2014 Hospital Admission SBO   08/26/2014 Imaging High-grade distal small bowel obstruction, likely from peritoneal metastatic disease.  Extensive hepatic metastatic disease. The largest metastasis in the left lobe has decreased from 07/23/2014.   08/31/2014 Surgery Colon, segmental resection for tumor, rectum - INVASIVE ADENOCARCINOMA, MODERATELY DIFFERENTIATED - TUMOR INVADES THROUGH SEROSA, SEE COMMENT. - PERFORATION WITH VISIBLE STENT. - LYMPHOVASCULAR AND PERINEURAL INVASION IS PRESENT. - DISTAL AND PROX...   09/15/2014 Imaging No bowel obstruction suspected status post multifocal small and large bowel resection with both primary reanastomoses and descending colostomy. Patulous appearance of colon in the right abdomen might reflect focal ileus.   11/06/2014 - 01/17/2015 Chemotherapy FOLFOX   11/28/2014 - 11/30/2014 Hospital Admission 1.   Nausea and vomiting 2.     Constipation    01/18/2015 - 01/22/2015 Hospital Admission Nausea and vomiting, UTI (lower urinary tract infection)   01/29/2015 Progression CT CAP- Progressive hepatic metastatic disease. New and enlarging heterogeneous nodules and masses in the liver measure up to 5.2 x 6.7 cm (image 56), previously 3.3 x 5.1 cm on 09/15/2014.   02/12/2015 -  Chemotherapy FOLFIRI, patient refuses Avastin.  5FU bolus deleted without administration beginning on cycle 1.   04/06/2015 Imaging CT C/A/P mixed response with decreased size of several hepatic lesions, increased size of others, dominant lesion in RL of liver 8.1x5.2x8.4cm, large serosal implant overlying RL of liver appears increased    CURRENT THERAPY: FOLFIRI  INTERVAL HISTORY: Sydney Morrison 60 y.o. 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 Michael Morrison on 08/31/2014 when she was admitted with SBO.   Sydney Morrison is accompanied by her husband and here to receive chemotherapy today. Denies chest pain or breathing issues. She is able to sleep at night. Her only complaint is liver pain. She experienced some constipation but has been using a stool softener to resolve this. Denies any new skin breakdown at site of prior pressure wound.   She saw Dr. Kathlene Morrison last Thursday, 1/19. Plan is for liver directed therapy with Y-90 in the next several weeks.  Past Medical History  Diagnosis Date  . Crohn's disease ([place])   . Chronic headache   . Cancer ([place])   . Nausea and vomiting 08/25/2014  . 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 [id number]; Chronic blood loss anemia; Antineoplastic chemotherapy induced anemia; Dehydration; Chemotherapy-induced  neuropathy ([place]); Nausea and vomiting; SBO (small bowel obstruction) ([place]); Atrial  flutter ([place]); Goals of care, counseling/discussion; Screen for STD (sexually transmitted disease); Pressure ulcer, stage 1; Postoperative fever; Leucocytosis; Protein-calorie malnutrition, severe ([place]); Tobacco abuse; Encounter for palliative care; Hyponatremia; Intolerance, food ([place]); Intractable nausea and vomiting; Constipation; Anemia of chronic disease; Thrombocytopenia ([place]); and UTI (lower urinary tract infection) on her problem list.     is allergic to avelox; codeine; dextromethorphan; erythromycin; penicillins; percocet; reglan; zofran; clindamycin hcl; red dye; and suprep.  Current Outpatient Prescriptions on File Prior to Visit  Medication Sig Dispense Refill  . acetaminophen (TYLENOL) 325 MG tablet Take 325 mg by mouth every 6 (six) hours as needed for fever or headache (headahce).     . ALPRAZolam (XANAX) 0.5 MG tablet Take 0.125-0.5 mg by mouth at bedtime as needed for sleep.     . benzocaine (ZILACTIN-B) 10 % mucosal gel Use as directed 1 application in the mouth or throat as needed for mouth pain. Reported on 03/27/2015    . cromolyn (NASALCROM) 5.2 MG/ACT nasal spray Place 1 spray into both nostrils at bedtime.    . Cyanocobalamin (VITAMIN B-12 CR) 1500 MCG TBCR Take 1 tablet by mouth daily.    Marland Kitchen dexamethasone (DECADRON) 4 MG tablet Take 1 tablet (4 mg total) by mouth 2 (two) times daily. For 2 days. Begin day of pump disconnect. 8 tablet 2  . diphenoxylate-atropine (LOMOTIL) 2.5-0.025 MG tablet May take 1-2 tablets four times a day as needed for loose stools. 45 tablet 0  . ENSURE (ENSURE) Take 237 mLs by mouth 2 (two) times daily with a meal.    . Fluorouracil (ADRUCIL IV) Inject into the vein every 14 (fourteen) days.    Sydney Morrison OIL Apply 1 application topically daily.    Marland Kitchen GENERLAC 10 GM/15ML SOLN Take 15 mLs (10 g total) by mouth daily as needed (for constipation). 2700 mL 2  . LEUCOVORIN CALCIUM IV Inject into the vein. Reported on 03/27/2015    .  lidocaine-prilocaine (EMLA) cream Apply 1 application topically as needed. Apply to portacath site 1-2 hours prior to use 30 g 3  . loperamide (IMODIUM A-D) 2 MG tablet Take 2 mg by mouth 4 (four) times daily as needed for diarrhea or loose stools.    Marland Kitchen LORazepam (ATIVAN) 0.5 MG tablet Take 1 tablet (0.5 mg total) by mouth every 6 (six) hours as needed (nausea/vomiting). May take SL if concern for vomiting exists 45 tablet 2  . Multiple Vitamins-Minerals (MULTIVITAMIN WITH MINERALS) tablet Take 1 tablet by mouth daily.    . nicotine (NICODERM CQ - DOSED IN MG/24 HR) 7 mg/24hr patch Place 7 mg onto the skin daily.     Current Facility-Administered Medications on File Prior to Visit  Medication Dose Route Frequency Provider Last Rate Last Dose  . atropine injection 0.5 mg  0.5 mg Intravenous Once PRN Patrici Ranks, MD   0.5 mg at 02/12/15 1001  . fluorouracil (ADRUCIL) 3,450 mg in sodium chloride 0.9 % 81 mL chemo infusion  2,400 mg/m2 (Treatment Plan Actual) Intravenous 1 day or 1 dose Patrici Ranks, MD      . heparin lock flush 100 unit/mL  500 Units Intracatheter Once PRN Patrici Ranks, MD      . irinotecan (CAMPTOSAR) 260 mg in dextrose 5 % 500 mL chemo infusion  180 mg/m2 (Treatment Plan Actual) Intravenous Once Patrici Ranks, MD 342 mL/hr at 04/24/15 1111 260 mg at 04/24/15 1111  .  leucovorin injection 30 mg  20 mg/m2 Intravenous Once Patrici Ranks, MD      . promethazine (PHENERGAN) injection 12.5 mg  12.5 mg Intravenous Q4H PRN Patrici Ranks, MD   12.5 mg at 02/12/15 1000  . sodium chloride 0.9 % injection 10 mL  10 mL Intracatheter PRN Patrici Ranks, MD      . sodium chloride 0.9 % injection 10 mL  10 mL Intracatheter PRN Patrici Ranks, MD   10 mL at 04/24/15 8676    Past Surgical History  Procedure Laterality Date  . Breast surgery    . Uterine fibroid surgery    . Colonoscopy w/ biopsies  06/29/2014    DR HUNG  . Flexible sigmoidoscopy N/A 06/30/2014     Procedure: FLEXIBLE SIGMOIDOSCOPY;  Surgeon: Carol Ada, MD;  Location: [facility] ENDOSCOPY;  Service: Endoscopy;  Laterality: N/A;  . Colonic stent placement N/A 06/30/2014    Procedure: COLONIC STENT PLACEMENT;  Surgeon: Carol Ada, MD;  Location: [facility] ENDOSCOPY;  Service: Endoscopy;  Laterality: N/A;  with fluro   . Portacath placement N/A 07/04/2014    Procedure: POWER PORT PLACEMENT;  Surgeon: Alphonsa Overall, MD;  Location: [place];  Service: General;  Laterality: N/A;  . Laparotomy N/A 08/31/2014    Procedure: EXPLORATORY LAPAROTOMY;  Surgeon: Sydney Boston, MD;  Location: [facility];  Service: General;  Laterality: N/A;  . Bowel resection N/A 08/31/2014    Procedure: SMALL BOWEL RESECTION;  Surgeon: Sydney Boston, MD;  Location: [facility];  Service: General;  Laterality: N/A;  . Colostomy N/A 08/31/2014    Procedure: low anterior resection with COLOSTOMY;  Surgeon: Sydney Boston, MD;  Location: [facility];  Service: General;  Laterality: N/A;  . Appendectomy  08/31/2014    Procedure: APPENDECTOMY;  Surgeon: Sydney Boston, MD;  Location: [facility];  Service: General;;  . Supracervical abdominal hysterectomy  08/31/2014    Procedure: HYSTERECTOMY SUPRACERVICAL ABDOMINAL BILATERAL TUBES AND OVARIES;  Surgeon: Sydney Boston, MD;  Location: [facility];  Service: General;;  . Transverse colon resection  08/31/2014    Procedure: TRANSVERSE COLON RESECTION;  Surgeon: Sydney Boston, MD;  Location: [facility];  Service: General;;  . Debulking  08/31/2014    Procedure: DEBULKING OF PERITONEUM;  Surgeon: Sydney Boston, MD;  Location: [facility];  Service: General;;    Denies any headaches, dizziness, double vision, chills, night sweats, nausea, constipation, chest pain, heart palpitations, shortness of breath, blood in stool, black tarry stool, urinary pain, urinary burning, urinary frequency, hematuria.   14 point review of systems was performed and is negative except as detailed under history of present illness and above  PHYSICAL EXAMINATION ECOG  PERFORMANCE STATUS: 2 - Symptomatic, <50% confined to bed   Vitals - 1 value per visit 10/17/9468  SYSTOLIC 962  DIASTOLIC 56  Pulse 75  Temperature 98  Respirations 16  Weight (lb) 102  Height   BMI 15.97    GENERAL:alert, no distress, cachectic, cooperative, smiling and accompanied by her husband, Jenny Reichmann.  SKIN: skin color, texture, turgor are normal HEAD: Normocephalic, No masses, lesions, tenderness or abnormalities EYES: normal, PERRLA, EOMI, Conjunctiva are pink and non-injected EARS: External ears normal OROPHARYNX:lips, buccal mucosa, and tongue normal and mucous membranes are moist  NECK: supple, trachea midline LYMPH:  No palpable adenopathy in the neck, supraclavicular region or axillae BREAST:not examined LUNGS: clear to auscultation and percussion HEART: regular rate & rhythm, no murmurs, no gallops, S1 normal and S2 normal ABDOMEN:abdomen soft, non-tender, normal bowel  sounds and ostomy noted.  Small stomal hernia. Very thin BACK: Back symmetric, no curvature, sacral area with mild erythema, no active skin breakdown EXTREMITIES:less then 2 second capillary refill, no joint deformities, effusion, or inflammation, no skin discoloration, no cyanosis  NEURO: alert & oriented x 3 with fluent speech, no focal motor/sensory deficits   LABORATORY DATA: I have reviewed the data below as listed.  Results for LYNDIE, VANDERLOOP (MRN [id number]) as of 04/24/2015 11:37  Ref. Range 04/24/2015 08:21  Sodium Latest Ref Range: 135-145 mmol/L 136  Potassium Latest Ref Range: 3.5-5.1 mmol/L 3.9  Chloride Latest Ref Range: 101-111 mmol/L 101  CO2 Latest Ref Range: 22-32 mmol/L 26  BUN Latest Ref Range: 6-20 mg/dL 19  Creatinine Latest Ref Range: 0.44-1.00 mg/dL 0.59  Calcium Latest Ref Range: 8.9-10.3 mg/dL 8.9  EGFR (Non-African Amer.) Latest Ref Range: >60 mL/min >60  EGFR (African American) Latest Ref Range: >60 mL/min >60  Glucose Latest Ref Range: 65-99 mg/dL 83  Anion gap Latest  Ref Range: 5-15  9  Alkaline Phosphatase Latest Ref Range: 38-126 U/L 229 (H)  Albumin Latest Ref Range: 3.5-5.0 g/dL 3.4 (L)  AST Latest Ref Range: 15-41 U/L 38  ALT Latest Ref Range: 14-54 U/L 37  Total Protein Latest Ref Range: 6.5-8.1 g/dL 7.4  Total Bilirubin Latest Ref Range: 0.3-1.2 mg/dL 0.5  WBC Latest Ref Range: 4.0-10.5 K/uL 10.9 (H)  RBC Latest Ref Range: 3.87-5.11 MIL/uL 3.25 (L)  Hemoglobin Latest Ref Range: 12.0-15.0 g/dL 9.2 (L)  HCT Latest Ref Range: 36.0-46.0 % 29.9 (L)  MCV Latest Ref Range: 78.0-100.0 fL 92.0  MCH Latest Ref Range: 26.0-34.0 pg 28.3  MCHC Latest Ref Range: 30.0-36.0 g/dL 30.8  RDW Latest Ref Range: 11.5-15.5 % 15.3  Platelets Latest Ref Range: 150-400 K/uL 276  Neutrophils Latest Units: % 65  Lymphocytes Latest Units: % 17  Monocytes Relative Latest Units: % 15  Eosinophil Latest Units: % 3  Basophil Latest Units: % 0  NEUT# Latest Ref Range: 1.7-7.7 K/uL 7.1  Lymphocyte # Latest Ref Range: 0.7-4.0 K/uL 1.8  Monocyte # Latest Ref Range: 0.1-1.0 K/uL 1.6 (H)  Eosinophils Absolute Latest Ref Range: 0.0-0.7 K/uL 0.3  Basophils Absolute Latest Ref Range: 0.0-0.1 K/uL 0.0    PATHOLOGY:  Diagnosis 1. Soft tissue mass, biopsy, peritoneal mass dome of bladder - METASTATIC ADENOCARCINOMA. 2. Appendix, Incidental - METASTATIC ADENOCARCINOMA. 3. Small intestine, resection, ileum - METASTATIC ADENOCARCINOMA. - RESECTION MARGINS ARE NEGATIVE. 4. Colon, segmental resection, with omentum, transverse - METASTATIC ADENOCARCINOMA INVOLVING COLON AND OMENTUM. - RESECTION MARGINS ARE NEGATIVE. 5. Soft tissue, biopsy, right pelvic sidewall nodule - METASTATIC ADENOCARCINOMA. 6. Soft tissue mass, biopsy, anterior peritoneum mass near dome - METASTATIC ADENOCARCINOMA. 7. Colon, segmental resection for tumor, rectum - INVASIVE ADENOCARCINOMA, MODERATELY DIFFERENTIATED - TUMOR INVADES THROUGH SEROSA, SEE COMMENT. - PERFORATION WITH VISIBLE STENT. -  LYMPHOVASCULAR AND PERINEURAL INVASION IS PRESENT. - DISTAL AND PROXIMAL RESECTION MARGINS ARE NEGATIVE, SEE COMMENT FOR RADIAL MARGIN. - METASTATIC CARCINOMA IN FOUR OF FOURTEEN LYMPH NODES (4/14). - SEROSAL METASTATIC NODULES AND SATELLITE NODULES PRESENT. - SEE ONCOLOGY TABLE AND COMMENT.    RADIOLOGY: I have personally reviewed the radiological images as listed and agreed with the findings in the report.  CLINICAL DATA: 60 year old female with history of metastatic colon cancer, status post surgical resection and chemotherapy. Additional history of Crohn's disease.  EXAM: CT CHEST, ABDOMEN, AND PELVIS WITH CONTRAST  TECHNIQUE: Multidetector CT imaging of the chest, abdomen and pelvis was performed following the  standard protocol during bolus administration of intravenous contrast.  CONTRAST: 113m OMNIPAQUE IOHEXOL 300 MG/ML SOLN  COMPARISON: CT of the chest, abdomen and pelvis 01/29/2015.  FINDINGS: CT CHEST FINDINGS  Mediastinum/Lymph Nodes: Heart size is normal. There is no significant pericardial fluid, thickening or pericardial calcification. No pathologically enlarged mediastinal or hilar lymph nodes. 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 Esophagus is unremarkable in appearance. No axillary lymphadenopathy.  Lungs/Pleura: 4 mm subpleural nodule in the posterior aspect of the left lower lobe (image 30 of series 6), unchanged compared to prior study 06/29/2014, presumably benign, likely a subpleural lymph node. Likewise, a 3 mm subpleural nodule in the left lower lobe (image 51 of series 6) is also unchanged compared to prior study 06/29/2014, also likely a subpleural lymph node. No other suspicious appearing pulmonary nodules or masses are otherwise noted. Mild linear scarring in the base of the left lower lobe. No acute consolidative airspace disease. No pleural effusions.  Musculoskeletal/Soft Tissues:  There are no aggressive appearing lytic or blastic lesions noted in the visualized portions of the skeleton.  CT ABDOMEN AND PELVIS FINDINGS  Hepatobiliary: Again noted are numerous hypovascular lesions scattered throughout the liver, compatible with widespread metastatic disease to the liver. Although several of the smaller lesions appear smaller than the prior examination and overall appear less numerous, suggesting some positive response to therapy, other lesions have clearly enlarged compared to the prior examination. A specific example of a shrinking lesion is a 2.3 x 3.8 x 3.3 cm lesion at the junction of segments 2 and 3 (image 62 of series 2), which previously measured 2.5 x 4.0 x 3.5 cm on prior study 01/29/2015. The largest lesion in the central aspect of the right lobe of the liver has enlarged and currently measures 8.1 x 5.2 x 8.4 cm (image 60 of series 2 and coronal image 31 of series 3), previously 6.7 x 5.2 x 7.2 cm on 01/29/2015. Additionally, several satellite lesions to this are increased in size, most notably a 2.8 x 2.2 cm lesion in segment 5 (image 70 of series 2). No intra or extrahepatic biliary duct of dilatation. Gallbladder is normal in appearance. In addition, there is an enlarging 1.6 x 5.0 cm low-attenuation lesion associated with the liver capsule overlying segments 5 and 8 (image 59 of series 2) which is more evident than on prior studies, likely to represent a serosal implant.  Pancreas: No pancreatic mass. No pancreatic ductal dilatation. No pancreatic or peripancreatic fluid or inflammatory changes.  Spleen: Unremarkable.  Adrenals/Urinary Tract: Bilateral adrenal glands and bilateral kidneys are normal in appearance. No hydroureteronephrosis. Urinary bladder is normal in appearance.  Stomach/Bowel: Normal appearance of the stomach. Status post low anterior resection with left lower quadrant colostomy. Additionally, there is a suture line  in the proximal transverse colon, suggesting prior partial colectomy in this region as well. No pathologic dilatation of small bowel or colon. Status post omentectomy.  Vascular/Lymphatic: Atherosclerotic calcifications are noted throughout the abdominal and pelvic vasculature, without evidence of aneurysm or dissection. No lymphadenopathy noted in the abdomen or pelvis.  Reproductive: Status post hysterectomy. Ovaries are not confidently identified may be surgically absent or atrophic.  Other: No significant volume of ascites. No pneumoperitoneum.  Musculoskeletal: There are no aggressive appearing lytic or blastic lesions noted in the visualized portions of the skeleton.  IMPRESSION: 1. Today's study demonstrates a mixed response to therapy with decreased size of several hepatic metastases, but increased size of others, including the dominant  lesion in the right lobe of the liver which currently measures 8.1 x 5.2 x 8.4 cm. Additionally, a large serosal implant overlying the right lobe of the liver also appears increased in size compared to the prior examination. 2. No new signs of metastatic disease elsewhere in the chest, abdomen or pelvis. 3. Status post low anterior resection, omentectomy, partial transverse colectomy and left lower quadrant colostomy. 4. Additional findings, as above, similar prior studies.   Electronically Signed  By: Vinnie Langton M.D.  On: 04/06/2015 14:29  ASSESSMENT AND PLAN:  Stage IV CRC Cachexia Pressure wound -- well healing Anemia Kras mutation  Her anemia is chronic and secondary to disease, malnutrition and treatment we will continue to monitor this. I have encouraged ongoing improvement in her nutritional status.  She does not need refills.   She has met with interventional radiology and will be scheduled for Y90 in the next few weeks. She will proceed with FOLFIRI today.   She is still a full code. She finds end of  life discussions defeating and as an indication of giving up and not staying positive.  I did have her meet with Palliative Care as well.  We have scheduled her tentatively to return for treatment in 2 weeks. We will have to stay in contact with IR in regards to timing of chemotherapy and liver directed therapy.  I will also have her evaluated for any potential trials, I believe our clinical trial coordinator is going to start in the next few weeks.  All questions were answered. The patient knows to call the clinic with any problems, questions or concerns. We can certainly see the patient much sooner if necessary.   This document serves as a record of services personally performed by Ancil Linsey, MD. It was created on her behalf by Arlyce Harman, a trained medical scribe. The creation of this record is based on the scribe's personal observations and the provider's statements to them. This document has been checked and approved by the attending provider.  I have reviewed the above documentation for accuracy and completeness, and I agree with the above.  This note was electronically signed.  Kelby Fam. [name], MD

## 2015-04-24 NOTE — Patient Instructions (Signed)
..  [facility] Discharge Instructions for Patients Receiving Chemotherapy   Beginning January 23rd 2017 lab work for the [facility] will be done in the  Main lab at [facility] on 1st floor. If you have a lab appointment with the [facility] please come in thru the  Main Entrance and check in at the main information desk   Today you received the following chemotherapy agents irinotecan, 28fu, leucovorin  To help prevent nausea and vomiting after your treatment, we encourage you to take your nausea medication  If you develop nausea and vomiting, or diarrhea that is not controlled by your medication, call the clinic.  The clinic phone number is [phone]. Office hours are Monday-Friday 8:30am-5:00pm.  BELOW ARE SYMPTOMS THAT SHOULD BE REPORTED IMMEDIATELY:  *FEVER GREATER THAN 101.0 F  *CHILLS WITH OR WITHOUT FEVER  NAUSEA AND VOMITING THAT IS NOT CONTROLLED WITH YOUR NAUSEA MEDICATION  *UNUSUAL SHORTNESS OF BREATH  *UNUSUAL BRUISING OR BLEEDING  TENDERNESS IN MOUTH AND THROAT WITH OR WITHOUT PRESENCE OF ULCERS  *URINARY PROBLEMS  *BOWEL PROBLEMS  UNUSUAL RASH Items with * indicate a potential emergency and should be followed up as soon as possible. If you have an emergency after office hours please contact your primary care physician or go to the nearest emergency department.  Please call the clinic during office hours if you have any questions or concerns.   You may also contact the Patient Navigator at [phone] should you have any questions or need assistance in obtaining follow up care.

## 2015-04-24 NOTE — Patient Instructions (Addendum)
[place] at [facility] Discharge Instructions  RECOMMENDATIONS MADE BY THE CONSULTANT AND ANY TEST RESULTS WILL BE SENT TO YOUR REFERRING PHYSICIAN.    Exam and discussion completed by Dr [name] today. Treatment today, we will still need to coordinate Dr Kathlene Cote. Return Thursday to have pump removed.  Call [facility] if you know your surgery date. Return to see the doctor in 2 weeks and chemotherapy.  Please call the clinic if you have any questions or concerns.    Thank you for choosing [place] at [facility] to provide your oncology and hematology care.  To afford each patient quality time with our provider, please arrive at least 15 minutes before your scheduled appointment time.   Beginning January 23rd 2017 lab work for the [facility] will be done in the  Main lab at [facility] on 1st floor. If you have a lab appointment with the [facility] please come in thru the  Main Entrance and check in at the main information desk  You need to re-schedule your appointment should you arrive 10 or more minutes late.  We strive to give you quality time with our providers, and arriving late affects you and other patients whose appointments are after yours.  Also, if you no show three or more times for appointments you may be dismissed from the clinic at the providers discretion.     Again, thank you for choosing [facility].  Our hope is that these requests will decrease the amount of time that you wait before being seen by our physicians.       _____________________________________________________________  Should you have questions after your visit to [facility], please contact our office at [phone] between the hours of 8:30 a.m. and 4:30 p.m.  Voicemails left after 4:30 p.m. will not be returned until the following business day.  For prescription refill requests, have your pharmacy contact our office.

## 2015-04-25 NOTE — Consult Note (Signed)
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 Ancil Linsey.  Referring Physician(s): Penland,Shannon K  History of Present Illness: Sydney Morrison is a 60 y.o. 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 Original diagnosis was on 06/29/2014 with endoscopic biopsy performed by Dr. Benson Norway of a nearly obstructing large mass 10 cm beyond the anal verge. The patient was hospitalized at that time and underwent rectosigmoid stent placement, Port-A-Cath placement and ultrasound-guided biopsy of a dominant left lobe liver mass revealing metastatic adenocarcinoma.  She has undergone extensive chemotherapy first under the care of Dr. Julieanne Manson and now under the care of Dr. Ancil Linsey.  After starting FOLFOX chemotherapy, she presented with a small bowel obstruction in June of last year and was admitted.  During that hospitalization, she underwent extensive surgery by Dr. Johney Maine with resection of small bowel, low anterior rectosigmoid resection with colostomy, appendectomy, hysterectomy, bilateral salpingo-oophorectomy, partial omentectomy and debulking of peritoneal tumor.  After recovery from surgery, she has undergone additional FOLFOX and FOLFIRI chemotherapy.  There has been mixed response of hepatic metastatic disease with chemotherapy. Several lesions, including a dominant left lobe lesion, have decreased in size with chemotherapy. A dominant right lobe lesion has shown significant enlargement, however. CEA at the time of diagnosis was 54.7 and reached a nadir of 16.2. Most recent CEA on 04/10/2015 is 20.2.  Past Medical History  Diagnosis Date  . Crohn's disease ([place])   . Chronic headache   . Cancer ([place])   . Nausea and vomiting 08/25/2014  . Sinus infection     Past Surgical History  Procedure Laterality Date  . Breast surgery    . Uterine fibroid  surgery    . Colonoscopy w/ biopsies  06/29/2014    DR HUNG  . Flexible sigmoidoscopy N/A 06/30/2014    Procedure: FLEXIBLE SIGMOIDOSCOPY;  Surgeon: Carol Ada, MD;  Location: [facility] ENDOSCOPY;  Service: Endoscopy;  Laterality: N/A;  . Colonic stent placement N/A 06/30/2014    Procedure: COLONIC STENT PLACEMENT;  Surgeon: Carol Ada, MD;  Location: [facility] ENDOSCOPY;  Service: Endoscopy;  Laterality: N/A;  with fluro   . Portacath placement N/A 07/04/2014    Procedure: POWER PORT PLACEMENT;  Surgeon: Alphonsa Overall, MD;  Location: [place];  Service: General;  Laterality: N/A;  . Laparotomy N/A 08/31/2014    Procedure: EXPLORATORY LAPAROTOMY;  Surgeon: Michael Boston, MD;  Location: [facility] ORS;  Service: General;  Laterality: N/A;  . Bowel resection N/A 08/31/2014    Procedure: SMALL BOWEL RESECTION;  Surgeon: Michael Boston, MD;  Location: [facility];  Service: General;  Laterality: N/A;  . Colostomy N/A 08/31/2014    Procedure: low anterior resection with COLOSTOMY;  Surgeon: Michael Boston, MD;  Location: [facility];  Service: General;  Laterality: N/A;  . Appendectomy  08/31/2014    Procedure: APPENDECTOMY;  Surgeon: Michael Boston, MD;  Location: [facility];  Service: General;;  . Supracervical abdominal hysterectomy  08/31/2014    Procedure: HYSTERECTOMY SUPRACERVICAL ABDOMINAL BILATERAL TUBES AND OVARIES;  Surgeon: Michael Boston, MD;  Location: [facility];  Service: General;;  . Transverse colon resection  08/31/2014    Procedure: TRANSVERSE COLON RESECTION;  Surgeon: Michael Boston, MD;  Location: [facility];  Service: General;;  . Debulking  08/31/2014    Procedure: DEBULKING OF PERITONEUM;  Surgeon: Michael Boston, MD;  Location: [facility];  Service: General;;  Allergies: Avelox; Codeine; Dextromethorphan; Erythromycin; Penicillins; Percocet; Reglan; Zofran; Clindamycin hcl; Red dye; and Suprep  Medications: Prior to Admission medications   Medication Sig Start Date End Date Taking? Authorizing Provider  acetaminophen (TYLENOL) 325 MG tablet  Take 325 mg by mouth every 6 (six) hours as needed for fever or headache (headahce).    Yes Historical Provider, MD  ALPRAZolam Duanne Moron) 0.5 MG tablet Take 0.125-0.5 mg by mouth at bedtime as needed for sleep.  09/17/14  Yes Historical Provider, MD  benzocaine (ZILACTIN-B) 10 % mucosal gel Use as directed 1 application in the mouth or throat as needed for mouth pain. Reported on 03/27/2015   Yes Historical Provider, MD  cromolyn (NASALCROM) 5.2 MG/ACT nasal spray Place 1 spray into both nostrils at bedtime.   Yes Historical Provider, MD  Cyanocobalamin (VITAMIN B-12 CR) 1500 MCG TBCR Take 1 tablet by mouth daily.   Yes Historical Provider, MD  dexamethasone (DECADRON) 4 MG tablet Take 1 tablet (4 mg total) by mouth 2 (two) times daily. For 2 days. Begin day of pump disconnect. 02/14/15  Yes Baird Cancer, PA-C  diphenoxylate-atropine (LOMOTIL) 2.5-0.025 MG tablet May take 1-2 tablets four times a day as needed for loose stools. 02/05/15  Yes Thomas S Kefalas, PA-C  ENSURE (ENSURE) Take 237 mLs by mouth 2 (two) times daily with a meal.   Yes Historical Provider, MD  Fluorouracil (ADRUCIL IV) Inject into the vein every 14 (fourteen) days.   Yes Historical Provider, MD  Frankincense OIL Apply 1 application topically daily.   Yes Historical Provider, MD  GENERLAC 10 GM/15ML SOLN Take 15 mLs (10 g total) by mouth daily as needed (for constipation). 03/29/15  Yes Manon Hilding Kefalas, PA-C  LEUCOVORIN CALCIUM IV Inject into the vein. Reported on 03/27/2015   Yes Historical Provider, MD  lidocaine-prilocaine (EMLA) cream Apply 1 application topically as needed. Apply to portacath site 1-2 hours prior to use 07/14/14  Yes Owens Shark, NP  loperamide (IMODIUM A-D) 2 MG tablet Take 2 mg by mouth 4 (four) times daily as needed for diarrhea or loose stools.   Yes Historical Provider, MD  Multiple Vitamins-Minerals (MULTIVITAMIN WITH MINERALS) tablet Take 1 tablet by mouth daily.   Yes Historical Provider, MD    nicotine (NICODERM CQ - DOSED IN MG/24 HR) 7 mg/24hr patch Place 7 mg onto the skin daily.   Yes Historical Provider, MD  LORazepam (ATIVAN) 0.5 MG tablet Take 1 tablet (0.5 mg total) by mouth every 6 (six) hours as needed (nausea/vomiting). May take SL if concern for vomiting exists 03/23/15   Baird Cancer, PA-C     Family History  Problem Relation Age of Onset  . Hypertension Mother   . Cancer Father     Social History   Social History  . Marital Status: Married    Spouse Name: N/A  . Number of Children: N/A  . Years of Education: N/A   Social History Main Topics  . Smoking status: Former Smoker -- 0.50 packs/day for 40 years    Types: Cigarettes    Quit date: 06/25/2014  . Smokeless tobacco: Never Used  . Alcohol Use: No  . Drug Use: No  . Sexual Activity: Not on file   Other Topics Concern  . Not on file   Social History Narrative   Married, husband Roderic Palau   Two grown children   Strong christian faith    ECOG Status: 1 - Symptomatic but completely ambulatory  Review of Systems:  A 12 point ROS discussed and pertinent positives are indicated in the HPI above.  All other systems are negative.  Review of Systems  Constitutional: Positive for fatigue and unexpected weight change. Negative for fever, chills, diaphoresis, activity change and appetite change.  HENT: Negative.   Respiratory: Negative.   Cardiovascular: Negative.   Gastrointestinal: Positive for abdominal pain. Negative for nausea, vomiting, diarrhea, blood in stool and abdominal distention.  Genitourinary: Negative.   Musculoskeletal: Negative.   Neurological: Negative.   Hematological: Negative.     Vital Signs: BP 124/66 mmHg  Pulse 64  Temp(Src) 98.2 F (36.8 C) (Oral)  Resp 14  Ht _0  (1.702 m)  Wt 100 lb (45.36 kg)  BMI 15.66 kg/m2  SpO2 100%  Physical Exam  Constitutional: She is oriented to person, place, and time. No distress.  Cardiovascular: Normal rate, regular rhythm  and normal heart sounds.  Exam reveals no gallop and no friction rub.   No murmur heard. Pulmonary/Chest: Effort normal and breath sounds normal. No respiratory distress. She has no wheezes. She has no rales.  Abdominal: Soft. Bowel sounds are normal. She exhibits no distension and no mass. There is no rebound and no guarding.  Mild tenderness in RUQ near liver margin.  Musculoskeletal: She exhibits no edema or tenderness.  Neurological: She is alert and oriented to person, place, and time.  Skin: She is not diaphoretic.    Imaging: Ct Chest W Contrast  04/06/2015  CLINICAL DATA:  60 year old female with history of metastatic colon cancer, status post surgical resection and chemotherapy. Additional history of Crohn's disease. EXAM: CT CHEST, ABDOMEN, AND PELVIS WITH CONTRAST TECHNIQUE: Multidetector CT imaging of the chest, abdomen and pelvis was performed following the standard protocol during bolus administration of intravenous contrast. CONTRAST:  143m OMNIPAQUE IOHEXOL 300 MG/ML  SOLN COMPARISON:  CT of the chest, abdomen and pelvis 01/29/2015. FINDINGS: CT CHEST FINDINGS Mediastinum/Lymph Nodes: Heart size is normal. There is no significant pericardial fluid, thickening or pericardial calcification. No pathologically enlarged mediastinal or hilar lymph nodes. 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 Esophagus is unremarkable in appearance. No axillary lymphadenopathy. Lungs/Pleura: 4 mm subpleural nodule in the posterior aspect of the left lower lobe (image 30 of series 6), unchanged compared to prior study 06/29/2014, presumably benign, likely a subpleural lymph node. Likewise, a 3 mm subpleural nodule in the left lower lobe (image 51 of series 6) is also unchanged compared to prior study 06/29/2014, also likely a subpleural lymph node. No other suspicious appearing pulmonary nodules or masses are otherwise noted. Mild linear scarring in the base of the left  lower lobe. No acute consolidative airspace disease. No pleural effusions. Musculoskeletal/Soft Tissues: There are no aggressive appearing lytic or blastic lesions noted in the visualized portions of the skeleton. CT ABDOMEN AND PELVIS FINDINGS Hepatobiliary: Again noted are numerous hypovascular lesions scattered throughout the liver, compatible with widespread metastatic disease to the liver. Although several of the smaller lesions appear smaller than the prior examination and overall appear less numerous, suggesting some positive response to therapy, other lesions have clearly enlarged compared to the prior examination. A specific example of a shrinking lesion is a 2.3 x 3.8 x 3.3 cm lesion at the junction of segments 2 and 3 (image 62 of series 2), which previously measured 2.5 x 4.0 x 3.5 cm on prior study 01/29/2015. The largest lesion in the central aspect of the right lobe of the liver has enlarged and currently measures 8.1 x  5.2 x 8.4 cm (image 60 of series 2 and coronal image 31 of series 3), previously 6.7 x 5.2 x 7.2 cm on 01/29/2015. Additionally, several satellite lesions to this are increased in size, most notably a 2.8 x 2.2 cm lesion in segment 5 (image 70 of series 2). No intra or extrahepatic biliary duct of dilatation. Gallbladder is normal in appearance. In addition, there is an enlarging 1.6 x 5.0 cm low-attenuation lesion associated with the liver capsule overlying segments 5 and 8 (image 59 of series 2) which is more evident than on prior studies, likely to represent a serosal implant. Pancreas: No pancreatic mass. No pancreatic ductal dilatation. No pancreatic or peripancreatic fluid or inflammatory changes. Spleen: Unremarkable. Adrenals/Urinary Tract: Bilateral adrenal glands and bilateral kidneys are normal in appearance. No hydroureteronephrosis. Urinary bladder is normal in appearance. Stomach/Bowel: Normal appearance of the stomach. Status post low anterior resection with left lower  quadrant colostomy. Additionally, there is a suture line in the proximal transverse colon, suggesting prior partial colectomy in this region as well. No pathologic dilatation of small bowel or colon. Status post omentectomy. Vascular/Lymphatic: Atherosclerotic calcifications are noted throughout the abdominal and pelvic vasculature, without evidence of aneurysm or dissection. No lymphadenopathy noted in the abdomen or pelvis. Reproductive: Status post hysterectomy. Ovaries are not confidently identified may be surgically absent or atrophic. Other: No significant volume of ascites.  No pneumoperitoneum. Musculoskeletal: There are no aggressive appearing lytic or blastic lesions noted in the visualized portions of the skeleton. IMPRESSION: 1. Today's study demonstrates a mixed response to therapy with decreased size of several hepatic metastases, but increased size of others, including the dominant lesion in the right lobe of the liver which currently measures 8.1 x 5.2 x 8.4 cm. Additionally, a large serosal implant overlying the right lobe of the liver also appears increased in size compared to the prior examination. 2. No new signs of metastatic disease elsewhere in the chest, abdomen or pelvis. 3. Status post low anterior resection, omentectomy, partial transverse colectomy and left lower quadrant colostomy. 4. Additional findings, as above, similar prior studies. Electronically Signed   By: Vinnie Langton M.D.   On: 04/06/2015 14:29   Ct Abdomen Pelvis W Contrast  04/06/2015  CLINICAL DATA:  60 year old female with history of metastatic colon cancer, status post surgical resection and chemotherapy. Additional history of Crohn's disease. EXAM: CT CHEST, ABDOMEN, AND PELVIS WITH CONTRAST TECHNIQUE: Multidetector CT imaging of the chest, abdomen and pelvis was performed following the standard protocol during bolus administration of intravenous contrast. CONTRAST:  127m OMNIPAQUE IOHEXOL 300 MG/ML  SOLN  COMPARISON:  CT of the chest, abdomen and pelvis 01/29/2015. FINDINGS: CT CHEST FINDINGS Mediastinum/Lymph Nodes: Heart size is normal. There is no significant pericardial fluid, thickening or pericardial calcification. No pathologically enlarged mediastinal or hilar lymph nodes. 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 Esophagus is unremarkable in appearance. No axillary lymphadenopathy. Lungs/Pleura: 4 mm subpleural nodule in the posterior aspect of the left lower lobe (image 30 of series 6), unchanged compared to prior study 06/29/2014, presumably benign, likely a subpleural lymph node. Likewise, a 3 mm subpleural nodule in the left lower lobe (image 51 of series 6) is also unchanged compared to prior study 06/29/2014, also likely a subpleural lymph node. No other suspicious appearing pulmonary nodules or masses are otherwise noted. Mild linear scarring in the base of the left lower lobe. No acute consolidative airspace disease. No pleural effusions. Musculoskeletal/Soft Tissues: There are no aggressive appearing lytic  or blastic lesions noted in the visualized portions of the skeleton. CT ABDOMEN AND PELVIS FINDINGS Hepatobiliary: Again noted are numerous hypovascular lesions scattered throughout the liver, compatible with widespread metastatic disease to the liver. Although several of the smaller lesions appear smaller than the prior examination and overall appear less numerous, suggesting some positive response to therapy, other lesions have clearly enlarged compared to the prior examination. A specific example of a shrinking lesion is a 2.3 x 3.8 x 3.3 cm lesion at the junction of segments 2 and 3 (image 62 of series 2), which previously measured 2.5 x 4.0 x 3.5 cm on prior study 01/29/2015. The largest lesion in the central aspect of the right lobe of the liver has enlarged and currently measures 8.1 x 5.2 x 8.4 cm (image 60 of series 2 and coronal image 31 of series  3), previously 6.7 x 5.2 x 7.2 cm on 01/29/2015. Additionally, several satellite lesions to this are increased in size, most notably a 2.8 x 2.2 cm lesion in segment 5 (image 70 of series 2). No intra or extrahepatic biliary duct of dilatation. Gallbladder is normal in appearance. In addition, there is an enlarging 1.6 x 5.0 cm low-attenuation lesion associated with the liver capsule overlying segments 5 and 8 (image 59 of series 2) which is more evident than on prior studies, likely to represent a serosal implant. Pancreas: No pancreatic mass. No pancreatic ductal dilatation. No pancreatic or peripancreatic fluid or inflammatory changes. Spleen: Unremarkable. Adrenals/Urinary Tract: Bilateral adrenal glands and bilateral kidneys are normal in appearance. No hydroureteronephrosis. Urinary bladder is normal in appearance. Stomach/Bowel: Normal appearance of the stomach. Status post low anterior resection with left lower quadrant colostomy. Additionally, there is a suture line in the proximal transverse colon, suggesting prior partial colectomy in this region as well. No pathologic dilatation of small bowel or colon. Status post omentectomy. Vascular/Lymphatic: Atherosclerotic calcifications are noted throughout the abdominal and pelvic vasculature, without evidence of aneurysm or dissection. No lymphadenopathy noted in the abdomen or pelvis. Reproductive: Status post hysterectomy. Ovaries are not confidently identified may be surgically absent or atrophic. Other: No significant volume of ascites.  No pneumoperitoneum. Musculoskeletal: There are no aggressive appearing lytic or blastic lesions noted in the visualized portions of the skeleton. IMPRESSION: 1. Today's study demonstrates a mixed response to therapy with decreased size of several hepatic metastases, but increased size of others, including the dominant lesion in the right lobe of the liver which currently measures 8.1 x 5.2 x 8.4 cm. Additionally, a large  serosal implant overlying the right lobe of the liver also appears increased in size compared to the prior examination. 2. No new signs of metastatic disease elsewhere in the chest, abdomen or pelvis. 3. Status post low anterior resection, omentectomy, partial transverse colectomy and left lower quadrant colostomy. 4. Additional findings, as above, similar prior studies. Electronically Signed   By: Vinnie Langton M.D.   On: 04/06/2015 14:29    Labs:  CBC:  Recent Labs  03/23/15 0855 03/27/15 0829 04/10/15 0839 04/24/15 0821  WBC 9.3 5.1 8.2 10.9*  HGB 10.6* 8.9* 9.2* 9.2*  HCT 33.4* 27.8* 29.4* 29.9*  PLT 303 282 272 276    COAGS:  Recent Labs  07/05/14 0407 08/25/14 1734  INR 1.20 1.18  APTT 35 30    BMP:  Recent Labs  03/23/15 0855 03/27/15 0829 04/10/15 0839 04/24/15 0821  NA 138 137 138 136  K 3.6 3.5 4.1 3.9  CL 95* 103 103  101  CO2 32 _0 GLUCOSE 150* 112* 82 83  BUN 36* _1 CALCIUM 9.6 8.7* 9.0 8.9  CREATININE 0.81 0.51 0.53 0.59  GFRNONAA >60 >60 >60 >60  GFRAA >60 >60 >60 >60    LIVER FUNCTION TESTS:  Recent Labs  03/23/15 0855 03/27/15 0829 04/10/15 0839 04/24/15 0821  BILITOT 0.5 0.2* 0.3 0.5  AST _2 38  ALT _3 37  ALKPHOS 206* 174* 191* 229*  PROT 8.0 7.0 7.4 7.4  ALBUMIN 3.7 3.2* 3.4* 3.4*    TUMOR MARKERS:  Recent Labs  01/03/15 0926 02/12/15 0938 03/12/15 0830 04/10/15 0839  CEA 20.2* 17.4* 16.2* 20.2*    Assessment and Plan:  I met with Sydney Morrison and her husband. We reviewed all of her CT scans since diagnosis with particular attention to metastatic disease in the liver. There has been mixed response to chemotherapy with more recent significant enlargement of a dominant right lobe liver lesion measuring just over 8 cm in maximum diameter. Liver directed therapies were discussed, particularly SIRT with yttrium-90 microspheres. Details and indications for yttrium-90 radioembolization were discussed  including need for initial mapping arteriography, embolization and pulmonary shunt calculation. Lobar treatment would then be performed roughly 10-14 days later with initial treatment of the right lobe. The left lobe could be treated roughly one month later after recovery from right lobe treatment.  Details of treatment were discussed, including risks. The patient is scheduled to receive her next chemotherapy on 04/24/2015. I told her that she should proceed with this treatment. Sydney Morrison would like to proceed with radioembolization to treat hepatic metastatic disease. We will begin the scheduling and authorization process.   Thank you for this interesting consult.  I greatly enjoyed meeting Sydney Morrison and look forward to participating in their care.  A copy of this report was sent to the requesting provider on this date.  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

## 2015-04-26 ENCOUNTER — Encounter (HOSPITAL_BASED_OUTPATIENT_CLINIC_OR_DEPARTMENT_OTHER): Payer: Commercial Managed Care - HMO

## 2015-04-26 VITALS — BP 101/53 | HR 57 | Temp 97.8°F | Resp 18

## 2015-04-26 DIAGNOSIS — C189 Malignant neoplasm of colon, unspecified: Secondary | ICD-10-CM

## 2015-04-26 DIAGNOSIS — C787 Secondary malignant neoplasm of liver and intrahepatic bile duct: Secondary | ICD-10-CM | POA: Diagnosis not present

## 2015-04-26 DIAGNOSIS — Z452 Encounter for adjustment and management of vascular access device: Secondary | ICD-10-CM | POA: Diagnosis not present

## 2015-04-26 MED ORDER — SODIUM CHLORIDE 0.9 % IJ SOLN
10.0000 mL | INTRAMUSCULAR | Status: DC | PRN
  Administered 2015-04-26: 10 mL
  Filled 2015-04-26: qty 10

## 2015-04-26 MED ORDER — HEPARIN SOD (PORK) LOCK FLUSH 100 UNIT/ML IV SOLN
500.0000 [IU] | Freq: Once | INTRAVENOUS | Status: AC | PRN
  Administered 2015-04-26: 500 [IU]

## 2015-04-26 NOTE — Patient Instructions (Signed)
[place] at [facility] Discharge Instructions  RECOMMENDATIONS MADE BY THE CONSULTANT AND ANY TEST RESULTS WILL BE SENT TO YOUR REFERRING PHYSICIAN.  Home infusion pump removed.   Please return as scheduled for follow up.    Thank you for choosing [place] at [facility] to provide your oncology and hematology care.  To afford each patient quality time with our provider, please arrive at least 15 minutes before your scheduled appointment time.   Beginning January 23rd 2017 lab work for the [facility] will be done in the  Main lab at [facility] on 1st floor. If you have a lab appointment with the [facility] please come in thru the  Main Entrance and check in at the main information desk  You need to re-schedule your appointment should you arrive 10 or more minutes late.  We strive to give you quality time with our providers, and arriving late affects you and other patients whose appointments are after yours.  Also, if you no show three or more times for appointments you may be dismissed from the clinic at the providers discretion.     Again, thank you for choosing [facility].  Our hope is that these requests will decrease the amount of time that you wait before being seen by our physicians.       _____________________________________________________________  Should you have questions after your visit to [facility], please contact our office at [phone] between the hours of 8:30 a.m. and 4:30 p.m.  Voicemails left after 4:30 p.m. will not be returned until the following business day.  For prescription refill requests, have your pharmacy contact our office.

## 2015-04-26 NOTE — Progress Notes (Signed)
Sanjuan Dame presented for Portacath access and flush. Proper placement of portacath confirmed by CXR. Portacath located right chest wall accessed with  H 20 needle. Good blood return present.  Home infusion pump removed.   Portacath flushed with 30ml NS and 500U/25ml Heparin and needle removed intact. Procedure without incident. Patient tolerated procedure well.

## 2015-04-27 ENCOUNTER — Other Ambulatory Visit (HOSPITAL_COMMUNITY): Payer: Self-pay | Admitting: Interventional Radiology

## 2015-04-27 DIAGNOSIS — C189 Malignant neoplasm of colon, unspecified: Secondary | ICD-10-CM

## 2015-04-27 DIAGNOSIS — C787 Secondary malignant neoplasm of liver and intrahepatic bile duct: Principal | ICD-10-CM

## 2015-04-30 ENCOUNTER — Other Ambulatory Visit (HOSPITAL_COMMUNITY): Payer: Self-pay | Admitting: Interventional Radiology

## 2015-05-04 ENCOUNTER — Ambulatory Visit (HOSPITAL_COMMUNITY): Payer: Commercial Managed Care - HMO

## 2015-05-08 ENCOUNTER — Encounter (HOSPITAL_BASED_OUTPATIENT_CLINIC_OR_DEPARTMENT_OTHER): Payer: Commercial Managed Care - HMO

## 2015-05-08 ENCOUNTER — Inpatient Hospital Stay (HOSPITAL_COMMUNITY): Payer: Commercial Managed Care - HMO

## 2015-05-08 ENCOUNTER — Encounter (HOSPITAL_COMMUNITY): Payer: Commercial Managed Care - HMO | Attending: Hematology & Oncology | Admitting: Hematology & Oncology

## 2015-05-08 ENCOUNTER — Encounter (HOSPITAL_COMMUNITY): Payer: Self-pay | Admitting: Hematology & Oncology

## 2015-05-08 VITALS — BP 100/62 | HR 82 | Temp 98.2°F | Resp 16 | Wt 104.7 lb

## 2015-05-08 DIAGNOSIS — C787 Secondary malignant neoplasm of liver and intrahepatic bile duct: Secondary | ICD-10-CM | POA: Diagnosis present

## 2015-05-08 DIAGNOSIS — E876 Hypokalemia: Secondary | ICD-10-CM | POA: Insufficient documentation

## 2015-05-08 DIAGNOSIS — C189 Malignant neoplasm of colon, unspecified: Secondary | ICD-10-CM

## 2015-05-08 DIAGNOSIS — Z5111 Encounter for antineoplastic chemotherapy: Secondary | ICD-10-CM | POA: Diagnosis not present

## 2015-05-08 DIAGNOSIS — C19 Malignant neoplasm of rectosigmoid junction: Secondary | ICD-10-CM | POA: Diagnosis not present

## 2015-05-08 DIAGNOSIS — T451X5A Adverse effect of antineoplastic and immunosuppressive drugs, initial encounter: Secondary | ICD-10-CM

## 2015-05-08 DIAGNOSIS — R112 Nausea with vomiting, unspecified: Secondary | ICD-10-CM | POA: Insufficient documentation

## 2015-05-08 DIAGNOSIS — E46 Unspecified protein-calorie malnutrition: Secondary | ICD-10-CM

## 2015-05-08 DIAGNOSIS — D649 Anemia, unspecified: Secondary | ICD-10-CM | POA: Diagnosis not present

## 2015-05-08 DIAGNOSIS — G620 Drug-induced polyneuropathy: Secondary | ICD-10-CM

## 2015-05-08 DIAGNOSIS — D638 Anemia in other chronic diseases classified elsewhere: Secondary | ICD-10-CM

## 2015-05-08 LAB — CBC WITH DIFFERENTIAL/PLATELET
BASOS ABS: 0 10*3/uL (ref 0.0–0.1)
Basophils Relative: 0 %
EOS ABS: 0.4 10*3/uL (ref 0.0–0.7)
EOS PCT: 3 %
HCT: 28.7 % — ABNORMAL LOW (ref 36.0–46.0)
Hemoglobin: 8.9 g/dL — ABNORMAL LOW (ref 12.0–15.0)
Lymphocytes Relative: 20 %
Lymphs Abs: 2 10*3/uL (ref 0.7–4.0)
MCH: 28 pg (ref 26.0–34.0)
MCHC: 31 g/dL (ref 30.0–36.0)
MCV: 90.3 fL (ref 78.0–100.0)
Monocytes Absolute: 1.5 10*3/uL — ABNORMAL HIGH (ref 0.1–1.0)
Monocytes Relative: 14 %
Neutro Abs: 6.4 10*3/uL (ref 1.7–7.7)
Neutrophils Relative %: 63 %
PLATELETS: 274 10*3/uL (ref 150–400)
RBC: 3.18 MIL/uL — AB (ref 3.87–5.11)
RDW: 15.6 % — AB (ref 11.5–15.5)
WBC: 10.3 10*3/uL (ref 4.0–10.5)

## 2015-05-08 LAB — COMPREHENSIVE METABOLIC PANEL
ALT: 29 U/L (ref 14–54)
AST: 32 U/L (ref 15–41)
Albumin: 3.5 g/dL (ref 3.5–5.0)
Alkaline Phosphatase: 190 U/L — ABNORMAL HIGH (ref 38–126)
Anion gap: 8 (ref 5–15)
BUN: 23 mg/dL — ABNORMAL HIGH (ref 6–20)
CHLORIDE: 102 mmol/L (ref 101–111)
CO2: 27 mmol/L (ref 22–32)
CREATININE: 0.61 mg/dL (ref 0.44–1.00)
Calcium: 8.9 mg/dL (ref 8.9–10.3)
GFR calc non Af Amer: >60 mL/min (ref >60)
Glucose, Bld: 93 mg/dL (ref 65–99)
POTASSIUM: 4.2 mmol/L (ref 3.5–5.1)
SODIUM: 137 mmol/L (ref 135–145)
Total Bilirubin: 0.2 mg/dL — ABNORMAL LOW (ref 0.3–1.2)
Total Protein: 7.6 g/dL (ref 6.5–8.1)

## 2015-05-08 MED ORDER — LEUCOVORIN CALCIUM INJECTION 100 MG
20.0000 mg/m2 | Freq: Once | INTRAMUSCULAR | Status: AC
  Administered 2015-05-08: 30 mg via INTRAVENOUS
  Filled 2015-05-08: qty 1.5

## 2015-05-08 MED ORDER — PALONOSETRON HCL INJECTION 0.25 MG/5ML
INTRAVENOUS | Status: AC
Start: 2015-05-08 — End: 2015-05-08
  Filled 2015-05-08: qty 5

## 2015-05-08 MED ORDER — LEUCOVORIN CALCIUM INJECTION 350 MG: 400.0000 mg/m2 | Freq: Once | INTRAVENOUS | Status: DC

## 2015-05-08 MED ORDER — SODIUM CHLORIDE 0.9 % IJ SOLN
10.0000 mL | INTRAMUSCULAR | Status: DC | PRN
  Administered 2015-05-08: 10 mL
  Filled 2015-05-08: qty 10

## 2015-05-08 MED ORDER — ATROPINE SULFATE 1 MG/ML IJ SOLN
0.5000 mg | Freq: Once | INTRAMUSCULAR | Status: AC | PRN
  Administered 2015-05-08: 0.5 mg via INTRAVENOUS
  Filled 2015-05-08: qty 1

## 2015-05-08 MED ORDER — SODIUM CHLORIDE 0.9 % IV SOLN
Freq: Once | INTRAVENOUS | Status: AC
  Administered 2015-05-08: 11:00:00 via INTRAVENOUS
  Filled 2015-05-08: qty 5

## 2015-05-08 MED ORDER — SODIUM CHLORIDE 0.9 % IV SOLN
Freq: Once | INTRAVENOUS | Status: AC
  Administered 2015-05-08: 11:00:00 via INTRAVENOUS

## 2015-05-08 MED ORDER — IRINOTECAN HCL CHEMO INJECTION 100 MG/5ML
180.0000 mg/m2 | Freq: Once | INTRAVENOUS | Status: AC
  Administered 2015-05-08: 260 mg via INTRAVENOUS
  Filled 2015-05-08: qty 4.33

## 2015-05-08 MED ORDER — PALONOSETRON HCL INJECTION 0.25 MG/5ML
0.2500 mg | Freq: Once | INTRAVENOUS | Status: AC
  Administered 2015-05-08: 0.25 mg via INTRAVENOUS

## 2015-05-08 MED ORDER — SODIUM CHLORIDE 0.9 % IV SOLN
2400.0000 mg/m2 | INTRAVENOUS | Status: DC
  Administered 2015-05-08: 3450 mg via INTRAVENOUS
  Filled 2015-05-08: qty 69

## 2015-05-08 NOTE — Patient Instructions (Addendum)
[place] at [facility] Discharge Instructions  RECOMMENDATIONS MADE BY THE CONSULTANT AND ANY TEST RESULTS WILL BE SENT TO YOUR REFERRING PHYSICIAN.    Exam and discussion by Dr [name] today The thing to worry about is your liver functions after.  He would treat you today.  You can be treated up to a week before the procedure. Chemotherapy today Return to see the doctor in 2 weeks Please call the clinic if you have any questions or concerns    Thank you for choosing [place] at [facility] to provide your oncology and hematology care.  To afford each patient quality time with our provider, please arrive at least 15 minutes before your scheduled appointment time.   Beginning January 23rd 2017 lab work for the [facility] will be done in the  Main lab at [facility] on 1st floor. If you have a lab appointment with the [facility] please come in thru the  Main Entrance and check in at the main information desk  You need to re-schedule your appointment should you arrive 10 or more minutes late.  We strive to give you quality time with our providers, and arriving late affects you and other patients whose appointments are after yours.  Also, if you no show three or more times for appointments you may be dismissed from the clinic at the providers discretion.     Again, thank you for choosing [facility].  Our hope is that these requests will decrease the amount of time that you wait before being seen by our physicians.       _____________________________________________________________  Should you have questions after your visit to [facility], please contact our office at [phone] between the hours of 8:30 a.m. and 4:30 p.m.  Voicemails left after 4:30 p.m. will not be returned until the following business day.  For prescription refill requests, have your pharmacy contact our office.

## 2015-05-08 NOTE — Progress Notes (Signed)
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 Diffuse liver metastases   07/02/2014 Imaging Changes consistent with the known history of colon carcinoma with metastatic disease involving the liver and peritoneum. Recent stent placement within the colonic lesion. Some fecal material is noted within the proximal portion of the stent which a...   07/05/2014 Pathology Results Liver, needle/core biopsy, left lobe - METASTATIC ADENOCARCINOMA   07/19/2014 - 08/16/2014 Chemotherapy FOLFOX   07/23/2014 Imaging Similar findings of advanced stage IV metastatic colon cancer with perhaps slight interval progression of disease in the liver.   08/25/2014 Treatment Plan Change Chemotherapy held due to hospitalization   08/25/2014 - 09/21/2014 Hospital Admission SBO   08/26/2014 Imaging High-grade distal small bowel obstruction, likely from peritoneal metastatic disease.  Extensive hepatic metastatic disease. The largest metastasis in the left lobe has decreased from 07/23/2014.   08/31/2014 Surgery Colon, segmental resection for tumor, rectum - INVASIVE ADENOCARCINOMA, MODERATELY DIFFERENTIATED - TUMOR INVADES THROUGH SEROSA, SEE COMMENT. - PERFORATION WITH VISIBLE STENT. - LYMPHOVASCULAR AND PERINEURAL INVASION IS PRESENT. - DISTAL AND PROX...   09/15/2014 Imaging No bowel obstruction suspected status post multifocal small and large bowel resection with both primary reanastomoses and descending colostomy. Patulous appearance of colon in the right abdomen might reflect focal ileus.   11/06/2014 - 01/17/2015 Chemotherapy FOLFOX   11/28/2014 -  11/30/2014 Hospital Admission 1.   Nausea and vomiting 2.   Constipation    01/18/2015 - 01/22/2015 Hospital Admission Nausea and vomiting, UTI (lower urinary tract infection)   01/29/2015 Progression CT CAP- Progressive hepatic metastatic disease. New and enlarging heterogeneous nodules and masses in the liver measure up to 5.2 x 6.7 cm (image 56), previously 3.3 x 5.1 cm on 09/15/2014.   02/12/2015 -  Chemotherapy FOLFIRI, patient refuses Avastin.  5FU bolus deleted without administration beginning on cycle 1.   04/06/2015 Imaging CT C/A/P mixed response with decreased size of several hepatic lesions, increased size of others, dominant lesion in RL of liver 8.1x5.2x8.4cm, large serosal implant overlying RL of liver appears increased    CURRENT THERAPY: FOLFIRI  INTERVAL HISTORY: Sydney Morrison 60 y.o. 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 Michael Boston on 08/31/2014 when she was admitted with SBO.   Sydney Morrison returns to the [facility] today accompanied by her husband. She is here today to receive chemotherapy.   At the start of her visit, she reports that Dr. Kathlene Cote told her that she wasn't to receive chemotherapy today. I advised her that Dr. Kathlene Cote and I have spoken in regards to IV chemotherapy and timing around Y90.  She is ok to receive chemotherapy today, and in regards to disease control I would strongly advise her to move forward.  She comments once more that she's just not ready for chemotherapy today, and didn't come prepared. She confirms that she is sleeping, eating; she says "oh yeah" with regards to confirming her eating habits. She also confirms that she is doing  well with her ostomy. She says that now she changes her bag every other day, and her skin irritation is much  better. She says leaving it for four days was always bad, and three days was better; now the changing it every 2 days is doing much better.  Since she is continuing with therapy, she is going to postpone her teeth cleaning for now. She has no other complaints today.    Past Medical History  Diagnosis Date  . Crohn's disease ([place])   . Chronic headache   . Cancer ([place])   . Nausea and vomiting 08/25/2014  . 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 [id number]; Chronic blood loss anemia; Antineoplastic chemotherapy induced anemia; Dehydration; Chemotherapy-induced neuropathy ([place]); Nausea and vomiting; SBO (small bowel obstruction) ([place]); Atrial flutter ([place]); Goals of care, counseling/discussion; Screen for STD (sexually transmitted disease); Pressure ulcer, stage 1; Postoperative fever; Leucocytosis; Protein-calorie malnutrition, severe ([place]); Tobacco abuse; Encounter for palliative care; Hyponatremia; Intolerance, food ([place]); Intractable nausea and vomiting; Constipation; Anemia of chronic disease; Thrombocytopenia ([place]); and UTI (lower urinary tract infection) on her problem list.     is allergic to avelox; codeine; dextromethorphan; erythromycin; penicillins; percocet; reglan; zofran; clindamycin hcl; red dye; and suprep.  Current Outpatient Prescriptions on File Prior to Visit  Medication Sig Dispense Refill  . acetaminophen (TYLENOL) 325 MG tablet Take 325 mg by mouth every 6 (six) hours as needed for fever or headache (headahce).     . ALPRAZolam (XANAX) 0.5 MG tablet Take 0.125-0.5 mg by mouth at bedtime as needed for sleep.     . benzocaine (ZILACTIN-B) 10 % mucosal gel Use as directed 1 application in the mouth or throat as needed for mouth pain. Reported on 03/27/2015    . cromolyn (NASALCROM) 5.2 MG/ACT nasal spray Place 1 spray into both nostrils at bedtime.    . Cyanocobalamin (VITAMIN B-12 CR) 1500 MCG TBCR Take 1 tablet by mouth daily.    Marland Kitchen ENSURE (ENSURE)  Take 237 mLs by mouth 2 (two) times daily with a meal.    . Fluorouracil (ADRUCIL IV) Inject into the vein every 14 (fourteen) days.    Burnadette Peter OIL Apply 1 application topically daily.    Marland Kitchen GENERLAC 10 GM/15ML SOLN Take 15 mLs (10 g total) by mouth daily as needed (for constipation). 2700 mL 2  . LEUCOVORIN CALCIUM IV Inject into the vein. Reported on 03/27/2015    . lidocaine-prilocaine (EMLA) cream Apply 1 application topically as needed. Apply to portacath site 1-2 hours prior to use 30 g 3  . LORazepam (ATIVAN) 0.5 MG tablet Take 1 tablet (0.5 mg total) by mouth every 6 (six) hours as needed (nausea/vomiting). May take SL if concern for vomiting exists 45 tablet 2  . Multiple Vitamins-Minerals (MULTIVITAMIN WITH MINERALS) tablet Take 1 tablet by mouth daily.    . nicotine (NICODERM CQ - DOSED IN MG/24 HR) 7 mg/24hr patch Place 7 mg onto the skin daily.    Marland Kitchen dexamethasone (DECADRON) 4 MG tablet Take 1 tablet (4 mg total) by mouth 2 (two) times daily. For 2 days. Begin day of pump disconnect. (Patient not taking: Reported on 05/08/2015) 8 tablet 2  . diphenoxylate-atropine (LOMOTIL) 2.5-0.025 MG tablet May take 1-2 tablets four times a day as needed for loose stools. (Patient not taking: Reported on 05/08/2015) 45 tablet 0  . loperamide (IMODIUM A-D) 2 MG tablet Take 2 mg by mouth 4 (four) times daily as needed for diarrhea or  loose stools. Reported on 05/08/2015     Current Facility-Administered Medications on File Prior to Visit  Medication Dose Route Frequency Provider Last Rate Last Dose  . atropine injection 0.5 mg  0.5 mg Intravenous Once PRN Patrici Ranks, MD   0.5 mg at 02/12/15 1001  . heparin lock flush 100 unit/mL  500 Units Intracatheter Once PRN Patrici Ranks, MD      . promethazine (PHENERGAN) injection 12.5 mg  12.5 mg Intravenous Q4H PRN Patrici Ranks, MD   12.5 mg at 02/12/15 1000  . sodium chloride 0.9 % injection 10 mL  10 mL Intracatheter PRN Patrici Ranks, MD         Past Surgical History  Procedure Laterality Date  . Breast surgery    . Uterine fibroid surgery    . Colonoscopy w/ biopsies  06/29/2014    DR HUNG  . Flexible sigmoidoscopy N/A 06/30/2014    Procedure: FLEXIBLE SIGMOIDOSCOPY;  Surgeon: Carol Ada, MD;  Location: [facility] ENDOSCOPY;  Service: Endoscopy;  Laterality: N/A;  . Colonic stent placement N/A 06/30/2014    Procedure: COLONIC STENT PLACEMENT;  Surgeon: Carol Ada, MD;  Location: [facility] ENDOSCOPY;  Service: Endoscopy;  Laterality: N/A;  with fluro   . Portacath placement N/A 07/04/2014    Procedure: POWER PORT PLACEMENT;  Surgeon: Alphonsa Overall, MD;  Location: [facility];  Service: General;  Laterality: N/A;  . Laparotomy N/A 08/31/2014    Procedure: EXPLORATORY LAPAROTOMY;  Surgeon: Michael Boston, MD;  Location: [facility] ORS;  Service: General;  Laterality: N/A;  . Bowel resection N/A 08/31/2014    Procedure: SMALL BOWEL RESECTION;  Surgeon: Michael Boston, MD;  Location: [facility] ORS;  Service: General;  Laterality: N/A;  . Colostomy N/A 08/31/2014    Procedure: low anterior resection with COLOSTOMY;  Surgeon: Michael Boston, MD;  Location: [facility] ORS;  Service: General;  Laterality: N/A;  . Appendectomy  08/31/2014    Procedure: APPENDECTOMY;  Surgeon: Michael Boston, MD;  Location: [facility];  Service: General;;  . Supracervical abdominal hysterectomy  08/31/2014    Procedure: HYSTERECTOMY SUPRACERVICAL ABDOMINAL BILATERAL TUBES AND OVARIES;  Surgeon: Michael Boston, MD;  Location: [facility];  Service: General;;  . Transverse colon resection  08/31/2014    Procedure: TRANSVERSE COLON RESECTION;  Surgeon: Michael Boston, MD;  Location: [facility];  Service: General;;  . Debulking  08/31/2014    Procedure: DEBULKING OF PERITONEUM;  Surgeon: Michael Boston, MD;  Location: [facility];  Service: General;;    Denies any headaches, dizziness, double vision, chills, night sweats, nausea, constipation, chest pain, heart palpitations, shortness of breath, blood in stool, black tarry stool, urinary  pain, urinary burning, urinary frequency, hematuria.  14 point review of systems was performed and is negative except as detailed under history of present illness and above  PHYSICAL EXAMINATION ECOG PERFORMANCE STATUS: 2 - Symptomatic, <50% confined to bed  Vitals - 1 value per visit 04/07/4079  SYSTOLIC 448  DIASTOLIC 62  Pulse 82  Temperature 98.2  Respirations 16  Weight (lb) 104.7  Height   BMI 16.39  VISIT REPORT     GENERAL:alert, no distress, cachectic, cooperative, smiling and accompanied by her husband, Jenny Reichmann.  SKIN: skin color, texture, turgor are normal HEAD: Normocephalic, No masses, lesions, tenderness or abnormalities EYES: normal, PERRLA, EOMI, Conjunctiva are pink and non-injected EARS: External ears normal OROPHARYNX:lips, buccal mucosa, and tongue normal and mucous membranes are moist  NECK: supple, trachea midline LYMPH:  No palpable adenopathy in the neck,  supraclavicular region or axillae BREAST:not examined LUNGS: clear to auscultation and percussion HEART: regular rate & rhythm, no murmurs, no gallops, S1 normal and S2 normal ABDOMEN:abdomen soft, non-tender, normal bowel sounds and ostomy noted.  Small stomal hernia. Very thin BACK: Back symmetric, no curvature, sacral area with mild erythema, no active skin breakdown EXTREMITIES:less then 2 second capillary refill, no joint deformities, effusion, or inflammation, no skin discoloration, no cyanosis  NEURO: alert & oriented x 3 with fluent speech, no focal motor/sensory deficits   LABORATORY DATA: I have reviewed the data below as listed.  Results for IRIE, DOWSON (MRN [id number])   Ref. Range 05/08/2015 09:34  Sodium Latest Ref Range: 135-145 mmol/L 137  Potassium Latest Ref Range: 3.5-5.1 mmol/L 4.2  Chloride Latest Ref Range: 101-111 mmol/L 102  CO2 Latest Ref Range: 22-32 mmol/L 27  BUN Latest Ref Range: 6-20 mg/dL 23 (H)  Creatinine Latest Ref Range: 0.44-1.00 mg/dL 0.61  Calcium Latest Ref  Range: 8.9-10.3 mg/dL 8.9  EGFR (Non-African Amer.) Latest Ref Range: >60 mL/min >60  EGFR (African American) Latest Ref Range: >60 mL/min >60  Glucose Latest Ref Range: 65-99 mg/dL 93  Anion gap Latest Ref Range: 5-15  8  Alkaline Phosphatase Latest Ref Range: 38-126 U/L 190 (H)  Albumin Latest Ref Range: 3.5-5.0 g/dL 3.5  AST Latest Ref Range: 15-41 U/L 32  ALT Latest Ref Range: 14-54 U/L 29  Total Protein Latest Ref Range: 6.5-8.1 g/dL 7.6  Total Bilirubin Latest Ref Range: 0.3-1.2 mg/dL 0.2 (L)  WBC Latest Ref Range: 4.0-10.5 K/uL 10.3  RBC Latest Ref Range: 3.87-5.11 MIL/uL 3.18 (L)  Hemoglobin Latest Ref Range: 12.0-15.0 g/dL 8.9 (L)  HCT Latest Ref Range: 36.0-46.0 % 28.7 (L)  MCV Latest Ref Range: 78.0-100.0 fL 90.3  MCH Latest Ref Range: 26.0-34.0 pg 28.0  MCHC Latest Ref Range: 30.0-36.0 g/dL 31.0  RDW Latest Ref Range: 11.5-15.5 % 15.6 (H)  Platelets Latest Ref Range: 150-400 K/uL 274  Neutrophils Latest Units: % 63  Lymphocytes Latest Units: % 20  Monocytes Relative Latest Units: % 14  Eosinophil Latest Units: % 3  Basophil Latest Units: % 0  NEUT# Latest Ref Range: 1.7-7.7 K/uL 6.4  Lymphocyte # Latest Ref Range: 0.7-4.0 K/uL 2.0  Monocyte # Latest Ref Range: 0.1-1.0 K/uL 1.5 (H)  Eosinophils Absolute Latest Ref Range: 0.0-0.7 K/uL 0.4  Basophils Absolute Latest Ref Range: 0.0-0.1 K/uL 0.0         PATHOLOGY:  Diagnosis 1. Soft tissue mass, biopsy, peritoneal mass dome of bladder - METASTATIC ADENOCARCINOMA. 2. Appendix, Incidental - METASTATIC ADENOCARCINOMA. 3. Small intestine, resection, ileum - METASTATIC ADENOCARCINOMA. - RESECTION MARGINS ARE NEGATIVE. 4. Colon, segmental resection, with omentum, transverse - METASTATIC ADENOCARCINOMA INVOLVING COLON AND OMENTUM. - RESECTION MARGINS ARE NEGATIVE. 5. Soft tissue, biopsy, right pelvic sidewall nodule - METASTATIC ADENOCARCINOMA. 6. Soft tissue mass, biopsy, anterior peritoneum mass near dome -  METASTATIC ADENOCARCINOMA. 7. Colon, segmental resection for tumor, rectum - INVASIVE ADENOCARCINOMA, MODERATELY DIFFERENTIATED - TUMOR INVADES THROUGH SEROSA, SEE COMMENT. - PERFORATION WITH VISIBLE STENT. - LYMPHOVASCULAR AND PERINEURAL INVASION IS PRESENT. - DISTAL AND PROXIMAL RESECTION MARGINS ARE NEGATIVE, SEE COMMENT FOR RADIAL MARGIN. - METASTATIC CARCINOMA IN FOUR OF FOURTEEN LYMPH NODES (4/14). - SEROSAL METASTATIC NODULES AND SATELLITE NODULES PRESENT. - SEE ONCOLOGY TABLE AND COMMENT.    RADIOLOGY: I have personally reviewed the radiological images as listed and agreed with the findings in the report.  CLINICAL DATA: 60 year old female with history of metastatic colon cancer, status  post surgical resection and chemotherapy. Additional history of Crohn's disease.  EXAM: CT CHEST, ABDOMEN, AND PELVIS WITH CONTRAST  TECHNIQUE: Multidetector CT imaging of the chest, abdomen and pelvis was performed following the standard protocol during bolus administration of intravenous contrast.  CONTRAST: 173m OMNIPAQUE IOHEXOL 300 MG/ML SOLN  COMPARISON: CT of the chest, abdomen and pelvis 01/29/2015.  FINDINGS: CT CHEST FINDINGS  Mediastinum/Lymph Nodes: Heart size is normal. There is no significant pericardial fluid, thickening or pericardial calcification. No pathologically enlarged mediastinal or hilar lymph nodes. 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 Esophagus is unremarkable in appearance. No axillary lymphadenopathy.  Lungs/Pleura: 4 mm subpleural nodule in the posterior aspect of the left lower lobe (image 30 of series 6), unchanged compared to prior study 06/29/2014, presumably benign, likely a subpleural lymph node. Likewise, a 3 mm subpleural nodule in the left lower lobe (image 51 of series 6) is also unchanged compared to prior study 06/29/2014, also likely a subpleural lymph node. No other suspicious  appearing pulmonary nodules or masses are otherwise noted. Mild linear scarring in the base of the left lower lobe. No acute consolidative airspace disease. No pleural effusions.  Musculoskeletal/Soft Tissues: There are no aggressive appearing lytic or blastic lesions noted in the visualized portions of the skeleton.  CT ABDOMEN AND PELVIS FINDINGS  Hepatobiliary: Again noted are numerous hypovascular lesions scattered throughout the liver, compatible with widespread metastatic disease to the liver. Although several of the smaller lesions appear smaller than the prior examination and overall appear less numerous, suggesting some positive response to therapy, other lesions have clearly enlarged compared to the prior examination. A specific example of a shrinking lesion is a 2.3 x 3.8 x 3.3 cm lesion at the junction of segments 2 and 3 (image 62 of series 2), which previously measured 2.5 x 4.0 x 3.5 cm on prior study 01/29/2015. The largest lesion in the central aspect of the right lobe of the liver has enlarged and currently measures 8.1 x 5.2 x 8.4 cm (image 60 of series 2 and coronal image 31 of series 3), previously 6.7 x 5.2 x 7.2 cm on 01/29/2015. Additionally, several satellite lesions to this are increased in size, most notably a 2.8 x 2.2 cm lesion in segment 5 (image 70 of series 2). No intra or extrahepatic biliary duct of dilatation. Gallbladder is normal in appearance. In addition, there is an enlarging 1.6 x 5.0 cm low-attenuation lesion associated with the liver capsule overlying segments 5 and 8 (image 59 of series 2) which is more evident than on prior studies, likely to represent a serosal implant.  Pancreas: No pancreatic mass. No pancreatic ductal dilatation. No pancreatic or peripancreatic fluid or inflammatory changes.  Spleen: Unremarkable.  Adrenals/Urinary Tract: Bilateral adrenal glands and bilateral kidneys are normal in appearance. No  hydroureteronephrosis. Urinary bladder is normal in appearance.  Stomach/Bowel: Normal appearance of the stomach. Status post low anterior resection with left lower quadrant colostomy. Additionally, there is a suture line in the proximal transverse colon, suggesting prior partial colectomy in this region as well. No pathologic dilatation of small bowel or colon. Status post omentectomy.  Vascular/Lymphatic: Atherosclerotic calcifications are noted throughout the abdominal and pelvic vasculature, without evidence of aneurysm or dissection. No lymphadenopathy noted in the abdomen or pelvis.  Reproductive: Status post hysterectomy. Ovaries are not confidently identified may be surgically absent or atrophic.  Other: No significant volume of ascites. No pneumoperitoneum.  Musculoskeletal: There are no aggressive appearing lytic or blastic lesions  noted in the visualized portions of the skeleton.  IMPRESSION: 1. Today's study demonstrates a mixed response to therapy with decreased size of several hepatic metastases, but increased size of others, including the dominant lesion in the right lobe of the liver which currently measures 8.1 x 5.2 x 8.4 cm. Additionally, a large serosal implant overlying the right lobe of the liver also appears increased in size compared to the prior examination. 2. No new signs of metastatic disease elsewhere in the chest, abdomen or pelvis. 3. Status post low anterior resection, omentectomy, partial transverse colectomy and left lower quadrant colostomy. 4. Additional findings, as above, similar prior studies.   Electronically Signed  By: Vinnie Langton M.D.  On: 04/06/2015 14:29  ASSESSMENT AND PLAN:  Stage IV CRC Cachexia Pressure wound -- well healing Anemia, multifactorial Kras mutation  Her anemia is chronic and secondary to disease, malnutrition and treatment we will continue to monitor this. I have encouraged ongoing improvement  in her nutritional status.  She has an angiogram on Monday and will receive treatment today. She will receive treatment today, and can be treated up to a week before she does the Y-90. I have reassured her that I have spoken with Dr. Kathlene Cote.  Her disease is such that delays in therapy will be detrimental for her. I emphasized this to her again today.  The most important issue comes in after the Y-90, when we assess her liver function, and when to treat her after. I advised her that I will consult with Dr. Kathlene Cote in regards to this.  She will return in 2 weeks for follow-up and chemotherapy. Dr. Kathlene Cote will call me if he advises anything different, but for now, there is no reason to hold chemotherapy except immediately after the Y-90.  All questions were answered. The patient knows to call the clinic with any problems, questions or concerns. We can certainly see the patient much sooner if necessary.   This document serves as a record of services personally performed by Ancil Linsey, MD. It was created on her behalf by Toni Amend, a trained medical scribe. The creation of this record is based on the scribe's personal observations and the provider's statements to them. This document has been checked and approved by the attending provider.  I have reviewed the above documentation for accuracy and completeness, and I agree with the above.  This note was electronically signed.  Kelby Fam. [name], MD

## 2015-05-08 NOTE — Progress Notes (Signed)
Patient refused phenergan pre-med.  Tolerated chemo well. Continuous infusion pump intact. Ambulatory on discharge home with husband.

## 2015-05-08 NOTE — Patient Instructions (Signed)
[facility] Discharge Instructions for Patients Receiving Chemotherapy  Today you received the following chemotherapy agents Irinotecan, leucovorin and 5FU pump.  To help prevent nausea and vomiting after your treatment, we encourage you to take your nausea medication as instructed. If you develop nausea and vomiting that is not controlled by your nausea medication, call the clinic. If it is after clinic hours your family physician or the after hours number for the clinic or go to the Emergency Department. BELOW ARE SYMPTOMS THAT SHOULD BE REPORTED IMMEDIATELY:  *FEVER GREATER THAN 101.0 F  *CHILLS WITH OR WITHOUT FEVER  NAUSEA AND VOMITING THAT IS NOT CONTROLLED WITH YOUR NAUSEA MEDICATION  *UNUSUAL SHORTNESS OF BREATH  *UNUSUAL BRUISING OR BLEEDING  TENDERNESS IN MOUTH AND THROAT WITH OR WITHOUT PRESENCE OF ULCERS  *URINARY PROBLEMS  *BOWEL PROBLEMS  UNUSUAL RASH Items with * indicate a potential emergency and should be followed up as soon as possible.  Return as scheduled.  I have been informed and understand all the instructions given to me. I know to contact the clinic, my physician, or go to the Emergency Department if any problems should occur. I do not have any questions at this time, but understand that I may call the clinic during office hours or the Patient Navigator at [phone] should I have any questions or need assistance in obtaining follow up care.    __________________________________________  _____________  __________ Signature of Patient or Authorized Representative            Date                   Time    __________________________________________ Nurse's Signature

## 2015-05-09 LAB — CEA: CEA: 31.6 ng/mL — AB (ref 0.0–4.7)

## 2015-05-10 ENCOUNTER — Encounter (HOSPITAL_COMMUNITY): Payer: Commercial Managed Care - HMO

## 2015-05-10 ENCOUNTER — Encounter (HOSPITAL_BASED_OUTPATIENT_CLINIC_OR_DEPARTMENT_OTHER): Payer: Commercial Managed Care - HMO

## 2015-05-10 ENCOUNTER — Encounter (HOSPITAL_COMMUNITY): Payer: Self-pay

## 2015-05-10 VITALS — BP 98/54 | HR 72 | Temp 98.2°F | Resp 16

## 2015-05-10 DIAGNOSIS — Z452 Encounter for adjustment and management of vascular access device: Secondary | ICD-10-CM

## 2015-05-10 DIAGNOSIS — C787 Secondary malignant neoplasm of liver and intrahepatic bile duct: Principal | ICD-10-CM

## 2015-05-10 DIAGNOSIS — C189 Malignant neoplasm of colon, unspecified: Secondary | ICD-10-CM | POA: Diagnosis not present

## 2015-05-10 MED ORDER — HEPARIN SOD (PORK) LOCK FLUSH 100 UNIT/ML IV SOLN
500.0000 [IU] | Freq: Once | INTRAVENOUS | Status: AC | PRN
  Administered 2015-05-10: 500 [IU]

## 2015-05-10 MED ORDER — SODIUM CHLORIDE 0.9 % IJ SOLN
10.0000 mL | INTRAMUSCULAR | Status: DC | PRN
  Administered 2015-05-10: 10 mL
  Filled 2015-05-10: qty 10

## 2015-05-11 ENCOUNTER — Other Ambulatory Visit: Payer: Self-pay | Admitting: Radiology

## 2015-05-11 ENCOUNTER — Other Ambulatory Visit: Payer: Self-pay | Admitting: General Surgery

## 2015-05-14 ENCOUNTER — Other Ambulatory Visit (HOSPITAL_COMMUNITY): Payer: Self-pay | Admitting: Interventional Radiology

## 2015-05-14 ENCOUNTER — Encounter (HOSPITAL_COMMUNITY)
Admission: RE | Admit: 2015-05-14 | Discharge: 2015-05-14 | Disposition: A | Discharge status: Home / Self Care | Payer: Commercial Managed Care - HMO | Source: Ambulatory Visit | Attending: Interventional Radiology | Admitting: Interventional Radiology

## 2015-05-14 ENCOUNTER — Ambulatory Visit (HOSPITAL_COMMUNITY)
Admission: RE | Admit: 2015-05-14 | Discharge: 2015-05-14 | Disposition: A | Discharge status: Home / Self Care | Payer: Commercial Managed Care - HMO | Source: Ambulatory Visit | Attending: Interventional Radiology | Admitting: Interventional Radiology

## 2015-05-14 ENCOUNTER — Encounter (HOSPITAL_COMMUNITY): Payer: Self-pay

## 2015-05-14 DIAGNOSIS — C189 Malignant neoplasm of colon, unspecified: Secondary | ICD-10-CM

## 2015-05-14 DIAGNOSIS — C787 Secondary malignant neoplasm of liver and intrahepatic bile duct: Secondary | ICD-10-CM | POA: Insufficient documentation

## 2015-05-14 DIAGNOSIS — C19 Malignant neoplasm of rectosigmoid junction: Secondary | ICD-10-CM | POA: Insufficient documentation

## 2015-05-14 DIAGNOSIS — R51 Headache: Secondary | ICD-10-CM | POA: Diagnosis not present

## 2015-05-14 DIAGNOSIS — Z880 Allergy status to penicillin: Secondary | ICD-10-CM | POA: Insufficient documentation

## 2015-05-14 LAB — PROTIME-INR
INR: 1.13 (ref 0.00–1.49)
Prothrombin Time: 14.7 seconds (ref 11.6–15.2)

## 2015-05-14 LAB — BASIC METABOLIC PANEL
ANION GAP: 8 (ref 5–15)
BUN: 21 mg/dL — AB (ref 6–20)
CHLORIDE: 104 mmol/L (ref 101–111)
CO2: 27 mmol/L (ref 22–32)
Calcium: 8.6 mg/dL — ABNORMAL LOW (ref 8.9–10.3)
Creatinine, Ser: 0.59 mg/dL (ref 0.44–1.00)
GFR calc Af Amer: >60 mL/min (ref >60)
GLUCOSE: 95 mg/dL (ref 65–99)
POTASSIUM: 3.9 mmol/L (ref 3.5–5.1)
Sodium: 139 mmol/L (ref 135–145)

## 2015-05-14 LAB — CBC WITH DIFFERENTIAL/PLATELET
BASOS ABS: 0 10*3/uL (ref 0.0–0.1)
Basophils Relative: 0 %
EOS PCT: 2 %
Eosinophils Absolute: 0.1 10*3/uL (ref 0.0–0.7)
HEMATOCRIT: 25.7 % — AB (ref 36.0–46.0)
Hemoglobin: 8 g/dL — ABNORMAL LOW (ref 12.0–15.0)
LYMPHS ABS: 2.2 10*3/uL (ref 0.7–4.0)
LYMPHS PCT: 45 %
MCH: 27.2 pg (ref 26.0–34.0)
MCHC: 31.1 g/dL (ref 30.0–36.0)
MCV: 87.4 fL (ref 78.0–100.0)
MONO ABS: 0.6 10*3/uL (ref 0.1–1.0)
Monocytes Relative: 11 %
NEUTROS ABS: 2 10*3/uL (ref 1.7–7.7)
Neutrophils Relative %: 42 %
Platelets: 163 10*3/uL (ref 150–400)
RBC: 2.94 MIL/uL — AB (ref 3.87–5.11)
RDW: 15.3 % (ref 11.5–15.5)
WBC: 4.9 10*3/uL (ref 4.0–10.5)

## 2015-05-14 LAB — APTT: APTT: 38 s — AB (ref 24–37)

## 2015-05-14 MED ORDER — CEFAZOLIN SODIUM 1-5 GM-% IV SOLN: 1.0000 g | Freq: Once | INTRAVENOUS | Status: DC

## 2015-05-14 MED ORDER — SODIUM CHLORIDE 0.9 % IV SOLN
INTRAVENOUS | Status: DC
  Administered 2015-05-14: 08:00:00 via INTRAVENOUS

## 2015-05-14 MED ORDER — VANCOMYCIN HCL IN DEXTROSE 1-5 GM/200ML-% IV SOLN
1000.0000 mg | Freq: Once | INTRAVENOUS | Status: DC
  Filled 2015-05-14: qty 200

## 2015-05-14 MED ORDER — CEFAZOLIN SODIUM-DEXTROSE 2-3 GM-% IV SOLR
INTRAVENOUS | Status: AC
  Filled 2015-05-14: qty 50

## 2015-05-14 MED ORDER — HYDROMORPHONE HCL 1 MG/ML IJ SOLN
INTRAMUSCULAR | Status: AC | PRN
  Administered 2015-05-14: 0.5 mg via INTRAVENOUS
  Administered 2015-05-14: 1 mg via INTRAVENOUS
  Administered 2015-05-14: 0.5 mg via INTRAVENOUS

## 2015-05-14 MED ORDER — IOHEXOL 300 MG/ML  SOLN
100.0000 mL | Freq: Once | INTRAMUSCULAR | Status: AC | PRN
  Administered 2015-05-14: 45 mL via INTRA_ARTERIAL

## 2015-05-14 MED ORDER — HYDROMORPHONE HCL 2 MG/ML IJ SOLN
INTRAMUSCULAR | Status: AC
  Filled 2015-05-14: qty 1

## 2015-05-14 MED ORDER — TECHNETIUM TO 99M ALBUMIN AGGREGATED
5.3000 | Freq: Once | INTRAVENOUS | Status: AC | PRN
  Administered 2015-05-14: 5 via INTRAVENOUS

## 2015-05-14 MED ORDER — IOHEXOL 300 MG/ML  SOLN
100.0000 mL | Freq: Once | INTRAMUSCULAR | Status: AC | PRN
  Administered 2015-05-14: 80 mL via INTRA_ARTERIAL

## 2015-05-14 MED ORDER — SODIUM CHLORIDE 0.9 % IV SOLN: INTRAVENOUS | Status: DC

## 2015-05-14 MED ORDER — LIDOCAINE HCL 1 % IJ SOLN
INTRAMUSCULAR | Status: AC
  Filled 2015-05-14: qty 20

## 2015-05-14 MED ORDER — FENTANYL CITRATE (PF) 100 MCG/2ML IJ SOLN
INTRAMUSCULAR | Status: AC | PRN
  Administered 2015-05-14 (×5): 25 ug via INTRAVENOUS

## 2015-05-14 MED ORDER — HYDROMORPHONE HCL 1 MG/ML IJ SOLN: 1.0000 mg | INTRAMUSCULAR | Status: DC | PRN

## 2015-05-14 MED ORDER — CEFAZOLIN SODIUM-DEXTROSE 2-3 GM-% IV SOLR
2.0000 g | Freq: Once | INTRAVENOUS | Status: AC
  Administered 2015-05-14: 2 g via INTRAVENOUS

## 2015-05-14 MED ORDER — MIDAZOLAM HCL 2 MG/2ML IJ SOLN
INTRAMUSCULAR | Status: AC | PRN
  Administered 2015-05-14 (×9): 0.5 mg via INTRAVENOUS

## 2015-05-14 MED ORDER — SODIUM CHLORIDE 0.9% FLUSH: 10.0000 mL | INTRAVENOUS | Status: DC | PRN

## 2015-05-14 MED ORDER — FENTANYL CITRATE (PF) 100 MCG/2ML IJ SOLN
INTRAMUSCULAR | Status: AC
  Filled 2015-05-14: qty 4

## 2015-05-14 MED ORDER — HEPARIN SOD (PORK) LOCK FLUSH 100 UNIT/ML IV SOLN
500.0000 [IU] | Freq: Once | INTRAVENOUS | Status: AC
  Administered 2015-05-14: 500 [IU] via INTRAVENOUS
  Filled 2015-05-14: qty 5

## 2015-05-14 MED ORDER — MIDAZOLAM HCL 2 MG/2ML IJ SOLN
INTRAMUSCULAR | Status: AC
  Filled 2015-05-14: qty 6

## 2015-05-14 MED ORDER — IOHEXOL 300 MG/ML  SOLN
200.0000 mL | Freq: Once | INTRAMUSCULAR | Status: AC | PRN
  Administered 2015-05-14: 40 mL via INTRA_ARTERIAL

## 2015-05-14 NOTE — Sedation Documentation (Signed)
Patient denies pain and is resting comfortably.  

## 2015-05-14 NOTE — Sedation Documentation (Signed)
50fr sheath removed from right fem artery by Dr. Kathlene Cote. Hemostasis achieved using exoseal closure device and manual pressure held for 3 minutes by Rema Fendt, RTR. RDP +3, RPT +3, groin level 0.

## 2015-05-14 NOTE — Discharge Instructions (Signed)
Post Y-90 Radioembolization Discharge Instructions ( Plan for next stage of treatment)  You have been given a radioactive material during your procedure.  While it is safe for you to be discharged home from the hospital, you need to proceed directly home.    Do not use public transportation, including air travel, lasting more than 2 hours for 1 week.  Avoid crowded public places for 1 week.  Adult visitors should try to avoid close contact with you for 1 week.    Children and pregnant females should not visit or have close contact with you for 1 week.  Items that you touch are not radioactive.  Do not sleep in the same bed as your partner for 1 week, and a condom should be used for sexual activity during the first 24 hours.  Your blood may be radioactive and caution should be used if any bleeding occurs during the recovery period.  Body fluids may be radioactive for 24 hours.  Wash your hands after voiding.  Men should sit to urinate.  Dispose of any soiled materials (flush down toilet or place in trash at home) during the first day.  Drink 6 to 8 glasses of fluids per day for 5 days to hydrate yourself.  If you need to see a doctor during the first week, you must let them know that you were treated with yttrium-90 microspheres, and will be slightly radioactive.  They can call Interventional Radiology [phone] with any questions  Angiogram, Care After These instructions give you information about caring for yourself after your procedure. Your doctor may also give you more specific instructions. Call your doctor if you have any problems or questions after your procedure.  HOME CARE  Take medicines only as told by your doctor.  Follow your doctor's instructions about:  Care of the area where the tube was inserted. Right groin.  Bandage (dressing) changes and removal in 24 to 48 hours post procedure. Keep clean and dry.  You may shower 24-48 hours after the procedure or as told by  your doctor.  Do not take baths, swim, or use a hot tub until your doctor approves.  Every day, check the area where the tube was inserted. Watch for:  Redness, swelling, or pain.  Fluid, blood, or pus.  Do not apply powder or lotion to the site.  Do not lift anything that is heavier than 10 lb (4.5 kg) for 5 days or as told by your doctor.  Ask your doctor when you can:  Return to work or school.  Do physical activities or play sports.  Have sex.  Do not drive or operate heavy machinery for 24 hours or as told by your doctor.  Have someone with you for the first 24 hours after the procedure.  Keep all follow-up visits as told by your doctor. This is important. GET HELP IF:  You have a fever.   You have chills.   You have more bleeding from the area where the tube was inserted. Hold pressure on the area.  You have redness, swelling, or pain in the area where the tube was inserted.  You have fluid or pus coming from the area. GET HELP RIGHT AWAY IF:   You have a lot of pain in the area where the tube was inserted.  The area where the tube was inserted is bleeding, and the bleeding does not stop after 30 minutes of holding steady pressure on the area.  The area near or just  beyond the insertion site becomes pale, cool, tingly, or numb.   This information is not intended to replace advice given to you by your health care provider. Make sure you discuss any questions you have with your health care provider.   Document Released: 06/13/2008 Document Revised: 04/07/2014 Document Reviewed: 08/18/2012 Elsevier Interactive Patient Education 2016 [place].    05/14/2015 .Moderate Conscious Sedation, Adult Sedation is the use of medicines to promote relaxation and relieve discomfort and anxiety. Moderate conscious sedation is a type of sedation. Under moderate conscious sedation you are less alert than normal but are still able to respond to instructions or  stimulation. Moderate conscious sedation is used during short medical and dental procedures. It is milder than deep sedation or general anesthesia and allows you to return to your regular activities sooner. LET [facility] CARE PROVIDER KNOW ABOUT:   Any allergies you have.  All medicines you are taking, including vitamins, herbs, eye drops, creams, and over-the-counter medicines.  Use of steroids (by mouth or creams).  Previous problems you or members of your family have had with the use of anesthetics.  Any blood disorders you have.  Previous surgeries you have had.  Medical conditions you have.  Possibility of pregnancy, if this applies.  Use of cigarettes, alcohol, or illegal drugs. RISKS AND COMPLICATIONS Generally, this is a safe procedure. However, as with any procedure, problems can occur. Possible problems include:  Oversedation.  Trouble breathing on your own. You may need to have a breathing tube until you are awake and breathing on your own.  Allergic reaction to any of the medicines used for the procedure. BEFORE THE PROCEDURE  You may have blood tests done. These tests can help show how well your kidneys and liver are working. They can also show how well your blood clots.  A physical exam will be done.  Only take medicines as directed by your health care provider. You may need to stop taking medicines (such as blood thinners, aspirin, or nonsteroidal anti-inflammatory drugs) before the procedure.   Do not eat or drink at least 6 hours before the procedure or as directed by your health care provider.  Arrange for a responsible adult, family member, or friend to take you home after the procedure. He or she should stay with you for at least 24 hours after the procedure, until the medicine has worn off. PROCEDURE   An intravenous (IV) catheter will be inserted into one of your veins. Medicine will be able to flow directly into your body through this catheter. You  may be given medicine through this tube to help prevent pain and help you relax.  The medical or dental procedure will be done. AFTER THE PROCEDURE  You will stay in a recovery area until the medicine has worn off. Your blood pressure and pulse will be checked.   Depending on the procedure you had, you may be allowed to go home when you can tolerate liquids and your pain is under control.   This information is not intended to replace advice given to you by your health care provider. Make sure you discuss any questions you have with your health care provider.   Document Released: 12/10/2000 Document Revised: 04/07/2014 Document Reviewed: 11/22/2012 Elsevier Interactive Patient Education 2016 [employer]  Moderate Conscious Sedation, Adult, Care After Refer to this sheet in the next few weeks. These instructions provide you with information on caring for yourself after your procedure. Your health care provider may also give  you more specific instructions. Your treatment has been planned according to current medical practices, but problems sometimes occur. Call your health care provider if you have any problems or questions after your procedure. WHAT TO EXPECT AFTER THE PROCEDURE  After your procedure:  You may feel sleepy, clumsy, and have poor balance for several hours.  Vomiting may occur if you eat too soon after the procedure. HOME CARE INSTRUCTIONS  Do not participate in any activities where you could become injured for at least 24 hours. Do not:  Drive.  Swim.  Ride a bicycle.  Operate heavy machinery.  Cook.  Use power tools.  Climb ladders.  Work from a high place.  Do not make important decisions or sign legal documents until you are improved.  If you vomit, drink water, juice, or soup when you can drink without vomiting. Make sure you have little or no nausea before eating solid foods.  Only take over-the-counter or prescription medicines for pain,  discomfort, or fever as directed by your health care provider.  Make sure you and your family fully understand everything about the medicines given to you, including what side effects may occur.  You should not drink alcohol, take sleeping pills, or take medicines that cause drowsiness for at least 24 hours.  If you smoke, do not smoke without supervision.  If you are feeling better, you may resume normal activities 24 hours after you were sedated.  Keep all appointments with your health care provider. SEEK MEDICAL CARE IF:  Your skin is pale or bluish in color.  You continue to feel nauseous or vomit.  Your pain is getting worse and is not helped by medicine.  You have bleeding or swelling.  You are still sleepy or feeling clumsy after 24 hours. SEEK IMMEDIATE MEDICAL CARE IF:  You develop a rash.  You have difficulty breathing.  You develop any type of allergic problem.  You have a fever. MAKE SURE YOU:  Understand these instructions.  Will watch your condition.  Will get help right away if you are not doing well or get worse.   This information is not intended to replace advice given to you by your health care provider. Make sure you discuss any questions you have with your health care provider.   Document Released: 01/05/2013 Document Revised: 04/07/2014 Document Reviewed: 01/05/2013 Elsevier Interactive Patient Education [employer].

## 2015-05-14 NOTE — Procedures (Signed)
Interventional Radiology Procedure Note  Procedure:  Visceral arteriography (3 first order trunks) and hepatic arteriography (replaced hepatic off of SMA)  Complications:  None  Estimated Blood Loss: 10-15 mL  See full report.  Replaced hepatic off of SMA supplies all of right lobe and part of left.  Left gastric artery supplies part of left lobe. No embolization necessary. 5 mCi MAA injected into hepatic artery for nuclear imaging to follow.  Venetia Night. Kathlene Cote, M.D Pager:  [phone]

## 2015-05-14 NOTE — Progress Notes (Signed)
Patient ID: Sydney Morrison, female   DOB: November 12, 1955, 60 y.o.   MRN: [id number]    Referring Physician(s): Penland,S  Chief Complaint: Metastatic colon cancer to liver   Subjective: Patient familiar to our service from recent consultation regarding treatment options for metastatic colon cancer to the liver. She was seen in IR clinic by Dr. Kathlene Cote 04/19/15 and deemed an appropriate candidate for Y 90 hepatic radioembolization. She presents today for initial hepatic/mesenteric arterial roadmapping with embolization and test Y-90 dosing. She currently denies fever, chest pain, dyspnea, abdominal/back pain vomiting or abnormal bleeding. She does have occasional headaches, weight loss,  occasional cough, as well as intermittent nausea.   Allergies: Avelox; Codeine; Dextromethorphan; Erythromycin; Penicillins; Percocet; Reglan; Zofran; Clindamycin hcl; Red dye; and Suprep  Medications: Prior to Admission medications   Medication Sig Start Date End Date Taking? Authorizing Provider  acetaminophen (TYLENOL) 325 MG tablet Take 325 mg by mouth every 6 (six) hours as needed for fever or headache (headahce).    Yes Historical Provider, MD  ALPRAZolam Duanne Moron) 0.5 MG tablet Take 0.125-0.5 mg by mouth at bedtime as needed for sleep.  09/17/14  Yes Historical Provider, MD  aprepitant (EMEND) 40 MG capsule Take 40 mg by mouth daily.   Yes Historical Provider, MD  benzocaine (ZILACTIN-B) 10 % mucosal gel Use as directed 1 application in the mouth or throat as needed for mouth pain. Reported on 03/27/2015   Yes Historical Provider, MD  cromolyn (NASALCROM) 5.2 MG/ACT nasal spray Place 1 spray into both nostrils at bedtime.   Yes Historical Provider, MD  Cyanocobalamin (VITAMIN B-12 CR) 1500 MCG TBCR Take 1 tablet by mouth daily.   Yes Historical Provider, MD  GENERLAC 10 GM/15ML SOLN Take 15 mLs (10 g total) by mouth daily as needed (for constipation). 03/29/15  Yes Baird Cancer, PA-C  irinotecan in  dextrose 5 % 500 mL Inject into the vein once.   Yes Historical Provider, MD  Multiple Vitamins-Minerals (MULTIVITAMIN WITH MINERALS) tablet Take 1 tablet by mouth daily.   Yes Historical Provider, MD  nicotine (NICODERM CQ - DOSED IN MG/24 HR) 7 mg/24hr patch Place 7 mg onto the skin daily.   Yes Historical Provider, MD  dexamethasone (DECADRON) 4 MG tablet Take 1 tablet (4 mg total) by mouth 2 (two) times daily. For 2 days. Begin day of pump disconnect. Patient not taking: Reported on 05/08/2015 02/14/15   Baird Cancer, PA-C  diphenoxylate-atropine (LOMOTIL) 2.5-0.025 MG tablet May take 1-2 tablets four times a day as needed for loose stools. Patient not taking: Reported on 05/08/2015 02/05/15   Manon Hilding Kefalas, PA-C  ENSURE (ENSURE) Take 237 mLs by mouth 2 (two) times daily with a meal.    Historical Provider, MD  Fluorouracil (ADRUCIL IV) Inject into the vein every 14 (fourteen) days.    Historical Provider, MD  Frankincense OIL Apply 1 application topically daily.    Historical Provider, MD  LEUCOVORIN CALCIUM IV Inject into the vein. Reported on 03/27/2015    Historical Provider, MD  lidocaine-prilocaine (EMLA) cream Apply 1 application topically as needed. Apply to portacath site 1-2 hours prior to use 07/14/14   Owens Shark, NP  loperamide (IMODIUM A-D) 2 MG tablet Take 2 mg by mouth 4 (four) times daily as needed for diarrhea or loose stools. Reported on 05/08/2015    Historical Provider, MD  LORazepam (ATIVAN) 0.5 MG tablet Take 1 tablet (0.5 mg total) by mouth every 6 (six) hours as needed (nausea/vomiting). May  take SL if concern for vomiting exists 03/23/15   Baird Cancer, PA-C     Vital Signs: BP 108/61 mmHg  Temp(Src) 99 F (37.2 C) (Oral)  Physical Exam thin white female in no acute distress. Chest clear to auscultation bilaterally. Clean, intact of chest wall Port-A-Cath. Heart with regular rate and rhythm. 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 Extremities with full range of motion and no edema.  Imaging: No results found.  Labs:  CBC:  Recent Labs  04/10/15 0839 04/24/15 0821 05/08/15 0934 05/14/15 0656  WBC 8.2 10.9* 10.3 4.9  HGB 9.2* 9.2* 8.9* 8.0*  HCT 29.4* 29.9* 28.7* 25.7*  PLT 272 276 274 163    COAGS:  Recent Labs  07/05/14 0407 08/25/14 1734  INR 1.20 1.18  APTT 35 30    BMP:  Recent Labs  03/27/15 0829 04/10/15 0839 04/24/15 0821 05/08/15 0934  NA 137 138 136 137  K 3.5 4.1 3.9 4.2  CL 103 103 101 102  CO2 26 25 26 27   GLUCOSE 112* 82 83 93  BUN 17 18 19  23*  CALCIUM 8.7* 9.0 8.9 8.9  CREATININE 0.51 0.53 0.59 0.61  GFRNONAA >60 >60 >60 >60  GFRAA >60 >60 >60 >60    LIVER FUNCTION TESTS:  Recent Labs  03/27/15 0829 04/10/15 0839 04/24/15 0821 05/08/15 0934  BILITOT 0.2* 0.3 0.5 0.2*  AST 25 28 38 32  ALT 22 24 37 29  ALKPHOS 174* 191* 229* 190*  PROT 7.0 7.4 7.4 7.6  ALBUMIN 3.2* 3.4* 3.4* 3.5    Assessment and Plan: Patient with history of progressive metastatic colon cancer to the liver with only mixed response to chemotherapy. She was seen in IR clinic recently by Dr. Kathlene Cote and deemed an appropriate candidate for Y 90 hepatic radioembolization. She presents today for the initial hepatic/mesenteric arterial roadmapping with embolization and test Y 90 dosing. Details/risks of procedure, including but not limited to, internal bleeding, infection, contrast nephropathy, nontarget embolization, discussed with patient and husband and their understanding and consent.   Electronically Signed: D. Rowe Robert 05/14/2015, 8:24 AM   I spent a total of 15 minutes at the the patient's bedside AND on the patient's hospital floor or unit, greater than 50% of which was counseling/coordinating care for hepatic/mesenteric arteriography with embolization/Y 90 test dosing

## 2015-05-22 ENCOUNTER — Ambulatory Visit (HOSPITAL_COMMUNITY): Payer: Commercial Managed Care - HMO | Admitting: Hematology & Oncology

## 2015-05-22 ENCOUNTER — Encounter (HOSPITAL_COMMUNITY): Payer: Self-pay

## 2015-05-22 ENCOUNTER — Encounter (HOSPITAL_BASED_OUTPATIENT_CLINIC_OR_DEPARTMENT_OTHER): Payer: Commercial Managed Care - HMO

## 2015-05-22 VITALS — BP 107/46 | HR 65 | Temp 97.7°F | Resp 16 | Wt 104.6 lb

## 2015-05-22 DIAGNOSIS — D638 Anemia in other chronic diseases classified elsewhere: Secondary | ICD-10-CM

## 2015-05-22 DIAGNOSIS — C19 Malignant neoplasm of rectosigmoid junction: Secondary | ICD-10-CM

## 2015-05-22 DIAGNOSIS — Z5111 Encounter for antineoplastic chemotherapy: Secondary | ICD-10-CM | POA: Diagnosis not present

## 2015-05-22 DIAGNOSIS — C189 Malignant neoplasm of colon, unspecified: Secondary | ICD-10-CM | POA: Diagnosis not present

## 2015-05-22 DIAGNOSIS — C787 Secondary malignant neoplasm of liver and intrahepatic bile duct: Secondary | ICD-10-CM | POA: Diagnosis not present

## 2015-05-22 LAB — CBC WITH DIFFERENTIAL/PLATELET
BASOS ABS: 0 10*3/uL (ref 0.0–0.1)
BASOS PCT: 0 %
EOS ABS: 0.3 10*3/uL (ref 0.0–0.7)
EOS PCT: 4 %
HCT: 27 % — ABNORMAL LOW (ref 36.0–46.0)
HEMOGLOBIN: 8.2 g/dL — AB (ref 12.0–15.0)
LYMPHS ABS: 1.8 10*3/uL (ref 0.7–4.0)
Lymphocytes Relative: 21 %
MCH: 27.2 pg (ref 26.0–34.0)
MCHC: 30.4 g/dL (ref 30.0–36.0)
MCV: 89.4 fL (ref 78.0–100.0)
Monocytes Absolute: 1.1 10*3/uL — ABNORMAL HIGH (ref 0.1–1.0)
Monocytes Relative: 13 %
NEUTROS PCT: 62 %
Neutro Abs: 5.4 10*3/uL (ref 1.7–7.7)
PLATELETS: 237 10*3/uL (ref 150–400)
RBC: 3.02 MIL/uL — AB (ref 3.87–5.11)
RDW: 16.3 % — ABNORMAL HIGH (ref 11.5–15.5)
WBC: 8.7 10*3/uL (ref 4.0–10.5)

## 2015-05-22 LAB — VITAMIN B12: Vitamin B-12: 1429 pg/mL — ABNORMAL HIGH (ref 180–914)

## 2015-05-22 LAB — COMPREHENSIVE METABOLIC PANEL
ALBUMIN: 3.4 g/dL — AB (ref 3.5–5.0)
ALK PHOS: 174 U/L — AB (ref 38–126)
ALT: 21 U/L (ref 14–54)
AST: 26 U/L (ref 15–41)
Anion gap: 10 (ref 5–15)
BUN: 20 mg/dL (ref 6–20)
CALCIUM: 8.6 mg/dL — AB (ref 8.9–10.3)
CHLORIDE: 104 mmol/L (ref 101–111)
CO2: 24 mmol/L (ref 22–32)
CREATININE: 0.56 mg/dL (ref 0.44–1.00)
GFR calc non Af Amer: >60 mL/min (ref >60)
GLUCOSE: 84 mg/dL (ref 65–99)
Potassium: 4 mmol/L (ref 3.5–5.1)
SODIUM: 138 mmol/L (ref 135–145)
Total Bilirubin: 0.2 mg/dL — ABNORMAL LOW (ref 0.3–1.2)
Total Protein: 7.1 g/dL (ref 6.5–8.1)

## 2015-05-22 LAB — FOLATE: FOLATE: 38.5 ng/mL (ref >5.9)

## 2015-05-22 LAB — FERRITIN: Ferritin: 108 ng/mL (ref 11–307)

## 2015-05-22 MED ORDER — ATROPINE SULFATE 1 MG/ML IJ SOLN
0.5000 mg | Freq: Once | INTRAMUSCULAR | Status: AC | PRN
  Administered 2015-05-22: 0.5 mg via INTRAVENOUS
  Filled 2015-05-22: qty 1

## 2015-05-22 MED ORDER — SODIUM CHLORIDE 0.9 % IV SOLN
2400.0000 mg/m2 | INTRAVENOUS | Status: DC
  Administered 2015-05-22: 3450 mg via INTRAVENOUS
  Filled 2015-05-22: qty 69

## 2015-05-22 MED ORDER — PROMETHAZINE HCL 25 MG/ML IJ SOLN: 12.5000 mg | INTRAMUSCULAR | Status: DC | PRN

## 2015-05-22 MED ORDER — PALONOSETRON HCL INJECTION 0.25 MG/5ML
0.2500 mg | Freq: Once | INTRAVENOUS | Status: AC
  Administered 2015-05-22: 0.25 mg via INTRAVENOUS
  Filled 2015-05-22: qty 5

## 2015-05-22 MED ORDER — SODIUM CHLORIDE 0.9 % IV SOLN
Freq: Once | INTRAVENOUS | Status: AC
  Administered 2015-05-22: 10:00:00 via INTRAVENOUS

## 2015-05-22 MED ORDER — SODIUM CHLORIDE 0.9 % IV SOLN
Freq: Once | INTRAVENOUS | Status: AC
  Administered 2015-05-22: 10:00:00 via INTRAVENOUS
  Filled 2015-05-22: qty 5

## 2015-05-22 MED ORDER — IRINOTECAN HCL CHEMO INJECTION 100 MG/5ML
180.0000 mg/m2 | Freq: Once | INTRAVENOUS | Status: AC
  Administered 2015-05-22: 260 mg via INTRAVENOUS
  Filled 2015-05-22: qty 13

## 2015-05-22 MED ORDER — SODIUM CHLORIDE 0.9 % IJ SOLN
10.0000 mL | INTRAMUSCULAR | Status: DC | PRN
  Administered 2015-05-22: 10 mL
  Filled 2015-05-22: qty 10

## 2015-05-22 MED ORDER — LEUCOVORIN CALCIUM INJECTION 350 MG
400.0000 mg/m2 | Freq: Once | INTRAMUSCULAR | Status: AC
  Administered 2015-05-22: 576 mg via INTRAVENOUS
  Filled 2015-05-22: qty 28.8

## 2015-05-22 NOTE — Progress Notes (Signed)
..  Sydney Morrison arrives today for chemo. Feels well, XRT procedure scheduled for March 2nd, discussed this with Dr. )[name] and proceed with treatment today. See Dr. [name] around 06/08/2015 Additional labs ordered today for anemia studies. Tolerated treatment well.

## 2015-05-22 NOTE — Patient Instructions (Signed)
..  [facility] Discharge Instructions for Patients Receiving Chemotherapy   Beginning January 23rd 2017 lab work for the [facility] will be done in the  Main lab at [facility] on 1st floor. If you have a lab appointment with the [place] please come in thru the  Main Entrance and check in at the main information desk   Today you received the following chemotherapy agents irinotecan, 10fu and leucovorin We will let you know about the iron tests Return on 3/14 to see Dr. [name]  To help prevent nausea and vomiting after your treatment, we encourage you to take your nausea medication  If you develop nausea and vomiting, or diarrhea that is not controlled by your medication, call the clinic.  The clinic phone number is [phone]. Office hours are Monday-Friday 8:30am-5:00pm.  BELOW ARE SYMPTOMS THAT SHOULD BE REPORTED IMMEDIATELY:  *FEVER GREATER THAN 101.0 F  *CHILLS WITH OR WITHOUT FEVER  NAUSEA AND VOMITING THAT IS NOT CONTROLLED WITH YOUR NAUSEA MEDICATION  *UNUSUAL SHORTNESS OF BREATH  *UNUSUAL BRUISING OR BLEEDING  TENDERNESS IN MOUTH AND THROAT WITH OR WITHOUT PRESENCE OF ULCERS  *URINARY PROBLEMS  *BOWEL PROBLEMS  UNUSUAL RASH Items with * indicate a potential emergency and should be followed up as soon as possible. If you have an emergency after office hours please contact your primary care physician or go to the nearest emergency department.  Please call the clinic during office hours if you have any questions or concerns.   You may also contact the Patient Navigator at [phone] should you have any questions or need assistance in obtaining follow up care.

## 2015-05-24 ENCOUNTER — Encounter (HOSPITAL_BASED_OUTPATIENT_CLINIC_OR_DEPARTMENT_OTHER): Payer: Commercial Managed Care - HMO

## 2015-05-24 VITALS — BP 118/68 | HR 72 | Temp 98.5°F | Resp 20

## 2015-05-24 DIAGNOSIS — C189 Malignant neoplasm of colon, unspecified: Secondary | ICD-10-CM

## 2015-05-24 DIAGNOSIS — C19 Malignant neoplasm of rectosigmoid junction: Secondary | ICD-10-CM

## 2015-05-24 DIAGNOSIS — Z452 Encounter for adjustment and management of vascular access device: Secondary | ICD-10-CM

## 2015-05-24 DIAGNOSIS — C787 Secondary malignant neoplasm of liver and intrahepatic bile duct: Principal | ICD-10-CM

## 2015-05-24 MED ORDER — SODIUM CHLORIDE 0.9 % IJ SOLN
10.0000 mL | INTRAMUSCULAR | Status: DC | PRN
  Administered 2015-05-24: 10 mL
  Filled 2015-05-24: qty 10

## 2015-05-24 MED ORDER — HEPARIN SOD (PORK) LOCK FLUSH 100 UNIT/ML IV SOLN
INTRAVENOUS | Status: AC
Start: 2015-05-24 — End: 2015-05-24
  Filled 2015-05-24: qty 5

## 2015-05-24 MED ORDER — HEPARIN SOD (PORK) LOCK FLUSH 100 UNIT/ML IV SOLN
500.0000 [IU] | Freq: Once | INTRAVENOUS | Status: AC | PRN
  Administered 2015-05-24: 500 [IU]

## 2015-05-24 NOTE — Progress Notes (Signed)
Patient denies  Complaints post chemo. Disconnected continuous infusion pump. Flushed port per protocol and removed port needle. Patient ambulatory on discharge home with husband.

## 2015-05-24 NOTE — Patient Instructions (Signed)
[place] at [facility] Discharge Instructions  RECOMMENDATIONS MADE BY THE CONSULTANT AND ANY TEST RESULTS WILL BE SENT TO YOUR REFERRING PHYSICIAN.  Removed continuous infusion pump. Return as scheduled.  Thank you for choosing [place] at [facility] to provide your oncology and hematology care.  To afford each patient quality time with our provider, please arrive at least 15 minutes before your scheduled appointment time.   Beginning January 23rd 2017 lab work for the [facility] will be done in the  Main lab at [facility] on 1st floor. If you have a lab appointment with the [place] please come in thru the  Main Entrance and check in at the main information desk  You need to re-schedule your appointment should you arrive 10 or more minutes late.  We strive to give you quality time with our providers, and arriving late affects you and other patients whose appointments are after yours.  Also, if you no show three or more times for appointments you may be dismissed from the clinic at the providers discretion.     Again, thank you for choosing [facility].  Our hope is that these requests will decrease the amount of time that you wait before being seen by our physicians.       _____________________________________________________________  Should you have questions after your visit to [facility], please contact our office at [phone] between the hours of 8:30 a.m. and 4:30 p.m.  Voicemails left after 4:30 p.m. will not be returned until the following business day.  For prescription refill requests, have your pharmacy contact our office.

## 2015-05-30 ENCOUNTER — Other Ambulatory Visit: Payer: Self-pay | Admitting: Radiology

## 2015-05-31 ENCOUNTER — Ambulatory Visit (HOSPITAL_COMMUNITY)
Admission: RE | Admit: 2015-05-31 | Discharge: 2015-05-31 | Disposition: A | Discharge status: Home / Self Care | Payer: Commercial Managed Care - HMO | Source: Ambulatory Visit | Attending: Interventional Radiology | Admitting: Interventional Radiology

## 2015-05-31 ENCOUNTER — Encounter (HOSPITAL_COMMUNITY): Payer: Self-pay

## 2015-05-31 ENCOUNTER — Encounter (HOSPITAL_COMMUNITY)
Admission: RE | Admit: 2015-05-31 | Discharge: 2015-05-31 | Disposition: A | Discharge status: Home / Self Care | Payer: Commercial Managed Care - HMO | Source: Ambulatory Visit | Attending: Interventional Radiology | Admitting: Interventional Radiology

## 2015-05-31 ENCOUNTER — Other Ambulatory Visit (HOSPITAL_COMMUNITY): Payer: Self-pay | Admitting: Interventional Radiology

## 2015-05-31 ENCOUNTER — Other Ambulatory Visit (HOSPITAL_COMMUNITY): Payer: Self-pay | Admitting: Oncology

## 2015-05-31 DIAGNOSIS — R51 Headache: Secondary | ICD-10-CM | POA: Insufficient documentation

## 2015-05-31 DIAGNOSIS — Z880 Allergy status to penicillin: Secondary | ICD-10-CM | POA: Insufficient documentation

## 2015-05-31 DIAGNOSIS — C189 Malignant neoplasm of colon, unspecified: Secondary | ICD-10-CM

## 2015-05-31 DIAGNOSIS — C19 Malignant neoplasm of rectosigmoid junction: Secondary | ICD-10-CM | POA: Diagnosis not present

## 2015-05-31 DIAGNOSIS — Z885 Allergy status to narcotic agent status: Secondary | ICD-10-CM | POA: Insufficient documentation

## 2015-05-31 DIAGNOSIS — C787 Secondary malignant neoplasm of liver and intrahepatic bile duct: Secondary | ICD-10-CM | POA: Insufficient documentation

## 2015-05-31 DIAGNOSIS — Z510 Encounter for antineoplastic radiation therapy: Secondary | ICD-10-CM | POA: Insufficient documentation

## 2015-05-31 DIAGNOSIS — D500 Iron deficiency anemia secondary to blood loss (chronic): Secondary | ICD-10-CM

## 2015-05-31 DIAGNOSIS — Z9221 Personal history of antineoplastic chemotherapy: Secondary | ICD-10-CM | POA: Insufficient documentation

## 2015-05-31 HISTORY — DX: Nausea with vomiting, unspecified: R11.2

## 2015-05-31 HISTORY — DX: Other specified postprocedural states: Z98.890

## 2015-05-31 LAB — PROTIME-INR
INR: 1.11 (ref 0.00–1.49)
PROTHROMBIN TIME: 14.5 s (ref 11.6–15.2)

## 2015-05-31 LAB — COMPREHENSIVE METABOLIC PANEL
ALBUMIN: 3.2 g/dL — AB (ref 3.5–5.0)
ALK PHOS: 170 U/L — AB (ref 38–126)
ALT: 31 U/L (ref 14–54)
ANION GAP: 8 (ref 5–15)
AST: 25 U/L (ref 15–41)
BILIRUBIN TOTAL: 0.2 mg/dL — AB (ref 0.3–1.2)
BUN: 21 mg/dL — AB (ref 6–20)
CALCIUM: 8.8 mg/dL — AB (ref 8.9–10.3)
CO2: 27 mmol/L (ref 22–32)
Chloride: 105 mmol/L (ref 101–111)
Creatinine, Ser: 0.53 mg/dL (ref 0.44–1.00)
GFR calc Af Amer: >60 mL/min (ref >60)
GLUCOSE: 93 mg/dL (ref 65–99)
Potassium: 4.2 mmol/L (ref 3.5–5.1)
Sodium: 140 mmol/L (ref 135–145)
TOTAL PROTEIN: 6.8 g/dL (ref 6.5–8.1)

## 2015-05-31 LAB — CBC WITH DIFFERENTIAL/PLATELET
BASOS PCT: 0 %
Basophils Absolute: 0 10*3/uL (ref 0.0–0.1)
EOS ABS: 0.1 10*3/uL (ref 0.0–0.7)
Eosinophils Relative: 2 %
HEMATOCRIT: 18.3 % — AB (ref 36.0–46.0)
HEMOGLOBIN: 5.4 g/dL — AB (ref 12.0–15.0)
LYMPHS PCT: 36 %
Lymphs Abs: 2.6 10*3/uL (ref 0.7–4.0)
MCH: 26.5 pg (ref 26.0–34.0)
MCHC: 29.5 g/dL — AB (ref 30.0–36.0)
MCV: 89.7 fL (ref 78.0–100.0)
MONOS PCT: 15 %
Monocytes Absolute: 1.1 10*3/uL — ABNORMAL HIGH (ref 0.1–1.0)
NEUTROS ABS: 3.5 10*3/uL (ref 1.7–7.7)
NEUTROS PCT: 47 %
Platelets: 296 10*3/uL (ref 150–400)
RBC: 2.04 MIL/uL — ABNORMAL LOW (ref 3.87–5.11)
RDW: 16.3 % — ABNORMAL HIGH (ref 11.5–15.5)
WBC: 7.3 10*3/uL (ref 4.0–10.5)

## 2015-05-31 LAB — APTT: aPTT: 43 seconds — ABNORMAL HIGH (ref 24–37)

## 2015-05-31 MED ORDER — SODIUM CHLORIDE 0.9 % IV SOLN
INTRAVENOUS | Status: DC
  Administered 2015-05-31: 08:00:00 via INTRAVENOUS

## 2015-05-31 MED ORDER — LIDOCAINE HCL 1 % IJ SOLN
INTRAMUSCULAR | Status: AC
  Filled 2015-05-31: qty 20

## 2015-05-31 MED ORDER — IOHEXOL 300 MG/ML  SOLN
100.0000 mL | Freq: Once | INTRAMUSCULAR | Status: AC | PRN
  Administered 2015-05-31: 12 mL via INTRA_ARTERIAL

## 2015-05-31 MED ORDER — SODIUM CHLORIDE 0.9 % IV SOLN: INTRAVENOUS | Status: DC

## 2015-05-31 MED ORDER — CEFAZOLIN SODIUM-DEXTROSE 2-3 GM-% IV SOLR
INTRAVENOUS | Status: AC
  Administered 2015-05-31: 2 g via INTRAVENOUS
  Filled 2015-05-31: qty 50

## 2015-05-31 MED ORDER — MIDAZOLAM HCL 2 MG/2ML IJ SOLN
INTRAMUSCULAR | Status: AC | PRN
Start: 2015-05-31 — End: 2015-05-31
  Administered 2015-05-31 (×5): 1 mg via INTRAVENOUS

## 2015-05-31 MED ORDER — PANTOPRAZOLE SODIUM 40 MG IV SOLR
INTRAVENOUS | Status: AC
  Administered 2015-05-31: 40 mg via INTRAVENOUS
  Filled 2015-05-31: qty 40

## 2015-05-31 MED ORDER — DEXAMETHASONE SODIUM PHOSPHATE 10 MG/ML IJ SOLN
INTRAMUSCULAR | Status: AC
  Administered 2015-05-31: 8 mg via INTRAVENOUS
  Filled 2015-05-31: qty 1

## 2015-05-31 MED ORDER — FENTANYL CITRATE (PF) 100 MCG/2ML IJ SOLN
INTRAMUSCULAR | Status: AC | PRN
  Administered 2015-05-31 (×2): 25 ug via INTRAVENOUS
  Administered 2015-05-31: 50 ug via INTRAVENOUS

## 2015-05-31 MED ORDER — CEFAZOLIN SODIUM-DEXTROSE 2-3 GM-% IV SOLR
2.0000 g | INTRAVENOUS | Status: AC
  Administered 2015-05-31: 2 g via INTRAVENOUS

## 2015-05-31 MED ORDER — IOHEXOL 300 MG/ML  SOLN
100.0000 mL | Freq: Once | INTRAMUSCULAR | Status: AC | PRN
  Administered 2015-05-31: 40 mL via INTRA_ARTERIAL

## 2015-05-31 MED ORDER — LIDOCAINE HCL 1 % IJ SOLN
INTRAMUSCULAR | Status: AC | PRN
  Administered 2015-05-31: 5 mL

## 2015-05-31 MED ORDER — MIDAZOLAM HCL 2 MG/2ML IJ SOLN
INTRAMUSCULAR | Status: AC
  Filled 2015-05-31: qty 6

## 2015-05-31 MED ORDER — PANTOPRAZOLE SODIUM 40 MG IV SOLR
40.0000 mg | Freq: Once | INTRAVENOUS | Status: AC
  Administered 2015-05-31: 40 mg via INTRAVENOUS
  Filled 2015-05-31: qty 40

## 2015-05-31 MED ORDER — HEPARIN SOD (PORK) LOCK FLUSH 100 UNIT/ML IV SOLN
500.0000 [IU] | INTRAVENOUS | Status: AC | PRN
  Administered 2015-05-31: 500 [IU]
  Filled 2015-05-31: qty 5

## 2015-05-31 MED ORDER — DEXAMETHASONE SODIUM PHOSPHATE 10 MG/ML IJ SOLN
8.0000 mg | Freq: Once | INTRAMUSCULAR | Status: AC
  Administered 2015-05-31: 8 mg via INTRAVENOUS

## 2015-05-31 MED ORDER — DEXAMETHASONE SODIUM PHOSPHATE 10 MG/ML IJ SOLN
20.0000 mg | Freq: Once | INTRAMUSCULAR | Status: DC
  Filled 2015-05-31: qty 2

## 2015-05-31 MED ORDER — FENTANYL CITRATE (PF) 100 MCG/2ML IJ SOLN
INTRAMUSCULAR | Status: AC
  Filled 2015-05-31: qty 4

## 2015-05-31 MED ORDER — PROCHLORPERAZINE EDISYLATE 5 MG/ML IJ SOLN
10.0000 mg | Freq: Once | INTRAMUSCULAR | Status: DC
  Filled 2015-05-31: qty 2

## 2015-05-31 NOTE — Sedation Documentation (Signed)
Patient denies pain and is resting comfortably.  

## 2015-05-31 NOTE — Progress Notes (Signed)
Patient ID: Sydney Morrison, female   DOB: 1956/01/16, 60 y.o.   MRN: [id number]    Referring Physician(s): Penland,S  Supervising Physician: Aletta Edouard  Chief Complaint: Metastatic colon cancer to liver   Subjective: Patient familiar to our service from recent consultation regarding treatment options for metastatic colon cancer to the liver. She was seen in IR clinic by Dr. Kathlene Cote 04/19/15 and deemed an appropriate candidate for Y 90 hepatic radioembolization. She underwent pre-Y 90 arterial mapping on 05/14/15 which revealed variant visceral arterial anatomy. At that time it was felt that radioembolization of the left lobe of the liver supplied by the left gastric artery territory would be unsafe for Y 90 administration due to high risk of embolizing radioactive particles to the stomach. Radioembolization of the right lobe of the liver via the replaced right hepatic artery appeared feasible. She presents again today for right hepatic lobe Y 90 radioembolization. She currently denies fever, chest pain, worsening dyspnea, cough, back pain, or abnormal bleeding. She does continue to have intermittent headaches, right upper quadrant discomfort and intermittent nausea as well as some slight fatigue. She received chemotherapy on 05/22/15.   Allergies: Avelox; Codeine; Dextromethorphan; Erythromycin; Penicillins; Percocet; Reglan; Zofran; Clindamycin hcl; Red dye; and Suprep  Medications: Prior to Admission medications   Medication Sig Start Date End Date Taking? Authorizing Provider  acetaminophen (TYLENOL) 325 MG tablet Take 325 mg by mouth every 6 (six) hours as needed for fever or headache (headahce).    Yes Historical Provider, MD  ALPRAZolam Duanne Moron) 0.5 MG tablet Take 0.125-0.5 mg by mouth at bedtime as needed for sleep.  09/17/14  Yes Historical Provider, MD  Cyanocobalamin (VITAMIN B-12 CR) 1500 MCG TBCR Take 1 tablet by mouth daily.   Yes Historical Provider, MD  ENSURE (ENSURE) Take  237 mLs by mouth 2 (two) times daily with a meal.   Yes Historical Provider, MD  lidocaine-prilocaine (EMLA) cream Apply 1 application topically as needed. Apply to portacath site 1-2 hours prior to use 07/14/14  Yes Owens Shark, NP  Multiple Vitamins-Minerals (MULTIVITAMIN WITH MINERALS) tablet Take 1 tablet by mouth daily.   Yes Historical Provider, MD  nicotine (NICODERM CQ - DOSED IN MG/24 HR) 7 mg/24hr patch Place 7 mg onto the skin daily.   Yes Historical Provider, MD  aprepitant (EMEND) 40 MG capsule Take 40 mg by mouth daily.    Historical Provider, MD  benzocaine (ZILACTIN-B) 10 % mucosal gel Use as directed 1 application in the mouth or throat as needed for mouth pain. Reported on 03/27/2015    Historical Provider, MD  cromolyn (NASALCROM) 5.2 MG/ACT nasal spray Place 1 spray into both nostrils at bedtime.    Historical Provider, MD  dexamethasone (DECADRON) 4 MG tablet Take 1 tablet (4 mg total) by mouth 2 (two) times daily. For 2 days. Begin day of pump disconnect. Patient not taking: Reported on 05/08/2015 02/14/15   Baird Cancer, PA-C  diphenoxylate-atropine (LOMOTIL) 2.5-0.025 MG tablet May take 1-2 tablets four times a day as needed for loose stools. Patient not taking: Reported on 05/08/2015 02/05/15   Baird Cancer, PA-C  Fluorouracil (ADRUCIL IV) Inject into the vein every 14 (fourteen) days.    Historical Provider, MD  Frankincense OIL Apply 1 application topically daily.    Historical Provider, MD  GENERLAC 10 GM/15ML SOLN Take 15 mLs (10 g total) by mouth daily as needed (for constipation). 03/29/15   Baird Cancer, PA-C  irinotecan in dextrose 5 % 500  mL Inject into the vein once.    Historical Provider, MD  LEUCOVORIN CALCIUM IV Inject into the vein. Reported on 03/27/2015    Historical Provider, MD  loperamide (IMODIUM A-D) 2 MG tablet Take 2 mg by mouth 4 (four) times daily as needed for diarrhea or loose stools. Reported on 05/08/2015    Historical Provider, MD    LORazepam (ATIVAN) 0.5 MG tablet Take 1 tablet (0.5 mg total) by mouth every 6 (six) hours as needed (nausea/vomiting). May take SL if concern for vomiting exists 03/23/15   Baird Cancer, PA-C     Vital Signs: BP 123/57 mmHg  Pulse 69  Temp(Src) 97.9 F (36.6 C) (Oral)  Resp 16  Ht 5\' 7"  (1.702 m)  Wt 105 lb (47.628 kg)  BMI 16.44 kg/m2  SpO2 99%  Physical Exam patient is awake, alert. Chest is clear to auscultation bilaterally. Clean, intact left chest wall Port-A-Cath. Abdomen soft, positive bowel sounds, left lower quadrant colostomy intact, mildly tender right upper quadrant palpation. Extremities with full range of motion, intact distal pulses, no LE edema.  Imaging: No results found.  Labs:  CBC:  Recent Labs  05/08/15 0934 05/14/15 0656 05/22/15 0911 05/31/15 0810  WBC 10.3 4.9 8.7 7.3  HGB 8.9* 8.0* 8.2* 5.4*  HCT 28.7* 25.7* 27.0* 18.3*  PLT 274 163 237 296    COAGS:  Recent Labs  07/05/14 0407 08/25/14 1734 05/14/15 0656 05/31/15 0810  INR 1.20 1.18 1.13 1.11  APTT 35 30 38* 43*    BMP:  Recent Labs  05/08/15 0934 05/14/15 0656 05/22/15 0911 05/31/15 0810  NA 137 139 138 140  K 4.2 3.9 4.0 4.2  CL 102 104 104 105  CO2 27 27 24 27   GLUCOSE 93 95 84 93  BUN 23* 21* 20 21*  CALCIUM 8.9 8.6* 8.6* 8.8*  CREATININE 0.61 0.59 0.56 0.53  GFRNONAA >60 >60 >60 >60  GFRAA >60 >60 >60 >60    LIVER FUNCTION TESTS:  Recent Labs  04/24/15 0821 05/08/15 0934 05/22/15 0911 05/31/15 0810  BILITOT 0.5 0.2* 0.2* 0.2*  AST 38 32 26 25  ALT 37 29 21 31   ALKPHOS 229* 190* 174* 170*  PROT 7.4 7.6 7.1 6.8  ALBUMIN 3.4* 3.5 3.4* 3.2*    Assessment and Plan: Patient with history of progressive metastatic colon cancer to the liver with only mixed response to chemotherapy. She was seen in IR clinic recently by Dr. Kathlene Cote and deemed an appropriate candidate for Y 90 hepatic radioembolization. She is status post pre-Y 90 arterial mapping on  05/14/15 which revealed variant visceral arterial anatomy. At that time it was felt that  radioembolization of the left lobe of the liver supplied by the left gastric artery territory would be unsafe for Y 90 administration due to high risk of embolizing radioactive particles to the stomach. Radioembolization of the right lobe of the liver via the replaced right hepatic artery appeared feasible. She presents again today for right hepatic lobe Y 90 radioembolization. She last received chemotherapy on 05/22/15. Patient's initial hemoglobin reading today was 5.4. Repeat hemoglobin was 7.9. Details/risks of procedure, including but not limited to, internal bleeding, infection, contrast nephropathy, nontarget embolization, discussed with patient and husband with their understanding and consent. Patient's hemoglobin values were discussed with Manning Charity with [facility] and patient will follow-up with their office early next week for recheck.    Electronically Signed: D. Lennette Bihari Allred 05/31/2015, 9:22 AM   I spent a total  of 30 minutes at the the patient's bedside AND on the patient's hospital floor or unit, greater than 50% of which was counseling/coordinating care for hepatic Y 90 radioembolization

## 2015-05-31 NOTE — Sedation Documentation (Signed)
Gauze/tegaderm bandage applied to R fem artery puncture site. Groin level 0, 2+ RDP, CDI.

## 2015-05-31 NOTE — Progress Notes (Signed)
Orders received to repeat CBC. Labs drawn via PAC 5 ml waste and 3 for lab and sent to lab. PAC flused with 82ml saline and then saline at 13ml/hr resumed per Alaris pump

## 2015-05-31 NOTE — Discharge Instructions (Signed)
Post Y-90 Radioembolization Discharge Instructions ° °You have been given a radioactive material during your procedure.  While it is safe for you to be discharged home from the hospital, you need to proceed directly home.   ° °Do not use public transportation, including air travel, lasting more than 2 hours for 1 week. ° °Avoid crowded public places for 1 week. ° °Adult visitors should try to avoid close contact with you for 1 week.   ° °Children and pregnant females should not visit or have close contact with you for 1 week. ° °Items that you touch are not radioactive. ° °Do not sleep in the same bed as your partner for 1 week, and a condom should be used for sexual activity during the first 24 hours. ° °Your blood may be radioactive and caution should be used if any bleeding occurs during the recovery period. ° °Body fluids may be radioactive for 24 hours.  Wash your hands after voiding.  Men should sit to urinate.  Dispose of any soiled materials (flush down toilet or place in trash at home) during the first day. ° °Drink 6 to 8 glasses of fluids per day for 5 days to hydrate yourself. ° °If you need to see a doctor during the first week, you must let them know that you were treated with yttrium-90 microspheres, and will be slightly radioactive.  They can call Interventional Radiology [phone] with any questions. °Angiogram, Care After °These instructions give you information about caring for yourself after your procedure. Your doctor may also give you more specific instructions. Call your doctor if you have any problems or questions after your procedure.  °HOME CARE °· Take medicines only as told by your doctor. °· Follow your doctor's instructions about: °¨ Care of the area where the tube was inserted. °¨ Bandage (dressing) changes and removal. °· You may shower 24-48 hours after the procedure or as told by your doctor. °· Do not take baths, swim, or use a hot tub until your doctor approves. °· Every day, check  the area where the tube was inserted. Watch for: °¨ Redness, swelling, or pain. °¨ Fluid, blood, or pus. °· Do not apply powder or lotion to the site. °· Do not lift anything that is heavier than 10 lb (4.5 kg) for 5 days or as told by your doctor. °· Ask your doctor when you can: °¨ Return to work or school. °¨ Do physical activities or play sports. °¨ Have sex. °· Do not drive or operate heavy machinery for 24 hours or as told by your doctor. °· Have someone with you for the first 24 hours after the procedure. °· Keep all follow-up visits as told by your doctor. This is important. °GET HELP IF: °· You have a fever.   °· You have chills.   °· You have more bleeding from the area where the tube was inserted. Hold pressure on the area. °· You have redness, swelling, or pain in the area where the tube was inserted. °· You have fluid or pus coming from the area. °GET HELP RIGHT AWAY IF:  °· You have a lot of pain in the area where the tube was inserted. °· The area where the tube was inserted is bleeding, and the bleeding does not stop after 30 minutes of holding steady pressure on the area. °· The area near or just beyond the insertion site becomes pale, cool, tingly, or numb. °  °This information is not intended to replace advice given   to you by your health care provider. Make sure you discuss any questions you have with your health care provider. °  °Document Released: 06/13/2008 Document Revised: 04/07/2014 Document Reviewed: 08/18/2012 °Elsevier Interactive Patient Education ©2016 [employer] °Moderate Conscious Sedation, Adult °Sedation is the use of medicines to promote relaxation and relieve discomfort and anxiety. Moderate conscious sedation is a type of sedation. Under moderate conscious sedation you are less alert than normal but are still able to respond to instructions or stimulation. Moderate conscious sedation is used during short medical and dental procedures. It is milder than deep sedation or  general anesthesia and allows you to return to your regular activities sooner. °LET YOUR HEALTH CARE PROVIDER KNOW ABOUT:  °· Any allergies you have. °· All medicines you are taking, including vitamins, herbs, eye drops, creams, and over-the-counter medicines. °· Use of steroids (by mouth or creams). °· Previous problems you or members of your family have had with the use of anesthetics. °· Any blood disorders you have. °· Previous surgeries you have had. °· Medical conditions you have. °· Possibility of pregnancy, if this applies. °· Use of cigarettes, alcohol, or illegal drugs. °RISKS AND COMPLICATIONS °Generally, this is a safe procedure. However, as with any procedure, problems can occur. Possible problems include: °· Oversedation. °· Trouble breathing on your own. You may need to have a breathing tube until you are awake and breathing on your own. °· Allergic reaction to any of the medicines used for the procedure. °BEFORE THE PROCEDURE °· You may have blood tests done. These tests can help show how well your kidneys and liver are working. They can also show how well your blood clots. °· A physical exam will be done.   °· Only take medicines as directed by your health care provider. You may need to stop taking medicines (such as blood thinners, aspirin, or nonsteroidal anti-inflammatory drugs) before the procedure.   °· Do not eat or drink at least 6 hours before the procedure or as directed by your health care provider. °· Arrange for a responsible adult, family member, or friend to take you home after the procedure. He or she should stay with you for at least 24 hours after the procedure, until the medicine has worn off. °PROCEDURE  °· An intravenous (IV) catheter will be inserted into one of your veins. Medicine will be able to flow directly into your body through this catheter. You may be given medicine through this tube to help prevent pain and help you relax. °· The medical or dental procedure will be  done. °AFTER THE PROCEDURE °· You will stay in a recovery area until the medicine has worn off. Your blood pressure and pulse will be checked.   °·  Depending on the procedure you had, you may be allowed to go home when you can tolerate liquids and your pain is under control. °  °This information is not intended to replace advice given to you by your health care provider. Make sure you discuss any questions you have with your health care provider. °  °Document Released: 12/10/2000 Document Revised: 04/07/2014 Document Reviewed: 11/22/2012 °Elsevier Interactive Patient Education ©2016 [employer] ° °

## 2015-05-31 NOTE — Procedures (Signed)
Interventional Radiology Procedure Note  Procedure:  Y-90 SIRT to right lobe of liver  Complications:  None  Estimated Blood Loss: <10 mL  Arteriography and Y-90 embolization performed of replaced right hepatic artery off SMA.  26 mCi prescribed dose of SIR Spheres.  Venetia Night. Kathlene Cote, M.D Pager:  [phone]

## 2015-05-31 NOTE — Progress Notes (Signed)
Patient ID: Sydney Morrison, female   DOB: 1955/04/05, 60 y.o.   MRN: [id number]    Referring Physician(s): Penland,S  Supervising Physician: Aletta Edouard  Chief Complaint:  Metastatic colon cancer to liver  Subjective:  Pt doing well; feeling a little "warm" but afebrile; denies sig abd pain, N/V or dyspnea  Allergies: Avelox; Codeine; Dextromethorphan; Erythromycin; Penicillins; Percocet; Reglan; Zofran; Clindamycin hcl; Red dye; and Suprep  Medications: Prior to Admission medications   Medication Sig Start Date End Date Taking? Authorizing Provider  acetaminophen (TYLENOL) 325 MG tablet Take 325 mg by mouth every 6 (six) hours as needed for fever or headache (headahce).    Yes Historical Provider, MD  ALPRAZolam Duanne Moron) 0.5 MG tablet Take 0.125-0.5 mg by mouth at bedtime as needed for sleep.  09/17/14  Yes Historical Provider, MD  Cyanocobalamin (VITAMIN B-12 CR) 1500 MCG TBCR Take 1 tablet by mouth daily.   Yes Historical Provider, MD  ENSURE (ENSURE) Take 237 mLs by mouth 2 (two) times daily with a meal.   Yes Historical Provider, MD  lidocaine-prilocaine (EMLA) cream Apply 1 application topically as needed. Apply to portacath site 1-2 hours prior to use 07/14/14  Yes Owens Shark, NP  Multiple Vitamins-Minerals (MULTIVITAMIN WITH MINERALS) tablet Take 1 tablet by mouth daily.   Yes Historical Provider, MD  nicotine (NICODERM CQ - DOSED IN MG/24 HR) 7 mg/24hr patch Place 7 mg onto the skin daily.   Yes Historical Provider, MD  aprepitant (EMEND) 40 MG capsule Take 40 mg by mouth daily.    Historical Provider, MD  benzocaine (ZILACTIN-B) 10 % mucosal gel Use as directed 1 application in the mouth or throat as needed for mouth pain. Reported on 03/27/2015    Historical Provider, MD  cromolyn (NASALCROM) 5.2 MG/ACT nasal spray Place 1 spray into both nostrils at bedtime.    Historical Provider, MD  dexamethasone (DECADRON) 4 MG tablet Take 1 tablet (4 mg total) by mouth 2 (two)  times daily. For 2 days. Begin day of pump disconnect. Patient not taking: Reported on 05/08/2015 02/14/15   Baird Cancer, PA-C  diphenoxylate-atropine (LOMOTIL) 2.5-0.025 MG tablet May take 1-2 tablets four times a day as needed for loose stools. Patient not taking: Reported on 05/08/2015 02/05/15   Baird Cancer, PA-C  Fluorouracil (ADRUCIL IV) Inject into the vein every 14 (fourteen) days.    Historical Provider, MD  Frankincense OIL Apply 1 application topically daily.    Historical Provider, MD  GENERLAC 10 GM/15ML SOLN Take 15 mLs (10 g total) by mouth daily as needed (for constipation). 03/29/15   Baird Cancer, PA-C  irinotecan in dextrose 5 % 500 mL Inject into the vein once.    Historical Provider, MD  LEUCOVORIN CALCIUM IV Inject into the vein. Reported on 03/27/2015    Historical Provider, MD  loperamide (IMODIUM A-D) 2 MG tablet Take 2 mg by mouth 4 (four) times daily as needed for diarrhea or loose stools. Reported on 05/08/2015    Historical Provider, MD  LORazepam (ATIVAN) 0.5 MG tablet Take 1 tablet (0.5 mg total) by mouth every 6 (six) hours as needed (nausea/vomiting). May take SL if concern for vomiting exists 03/23/15   Baird Cancer, PA-C     Vital Signs: BP 109/59 mmHg  Pulse 68  Temp(Src) 97.9 F (36.6 C) (Oral)  Resp 18  Ht 5' 7"  (1.702 m)  Wt 105 lb (47.628 kg)  BMI 16.44 kg/m2  SpO2 100%  Physical Exam pt  awake/alert; abd soft,ND/not sig tender; puncture site rt CFA soft,clean, dry,minimal tenderness, no hematoma; intact distal pulses  Imaging: Nm Liver Img Spect  05/31/2015  CLINICAL DATA:  Colorectal carcinoma with unresectable liver metastasis. First therapy to the RIGHT hepatic lobe. EXAM: NUCLEAR MEDICINE SPECIAL MED RAD PHYSICS CONS; NUCLEAR MEDICINE RADIO PHARM THERAPY INTRA ARTERIAL; NUCLEAR MEDICINE TREATMENT PROCEDURE; NUCLEAR MEDICINE LIVER SCAN TECHNIQUE: In conjunction with the interventional radiologist a Y- Microsphere dose was calculated  utilizing body surface area formulation. Calculated dose equal 26.6 mCi. Pre therapy MAA liver SPECT scan and CTA were evaluated. Utilizing a microcatheter system, the RIGHT hepatic artery was selected and Y-90 microspheres were delivered in fractionated aliquots. Radiopharmaceutical was delivered by the interventional radiologist and nuclear radiologist. The patient tolerated procedure well. No adverse effects were noted. Bremsstrahlung planar and SPECT imaging of the abdomen following intrahepatic arterial delivery of Y-90 microsphere was performed. RADIOPHARMACEUTICALS:  61.4 millicuries yttrium 90 microspheres COMPARISON:  MAA scan 05/14/2015, CT abdomen 04/06/2015 FINDINGS: Y - 90 microspheres therapy as above. First therapy the right hepatic lobe. Bremsstrahlung planar and SPECT imaging of the abdomen following intrahepatic arterial delivery of Y-66mcrosphere demonstrates radioactivity localized to the RIGHT hepatic lobe. No evidence of extrahepatic activity. IMPRESSION: Successful Y - 90 microsphere delivery for treatment of unresectable liver metastasis. First therapy to the RIGHT lobe. Bremssstrahlung scan demonstrates activity localized to RIGHT hepatic lobe with no extrahepatic activity identified. Above Electronically Signed   By: SSuzy BouchardM.D.   On: 05/31/2015 15:47   Nm Special Med Rad Physics Cons  05/31/2015  CLINICAL DATA:  Colorectal carcinoma with unresectable liver metastasis. First therapy to the RIGHT hepatic lobe. EXAM: NUCLEAR MEDICINE SPECIAL MED RAD PHYSICS CONS; NUCLEAR MEDICINE RADIO PHARM THERAPY INTRA ARTERIAL; NUCLEAR MEDICINE TREATMENT PROCEDURE; NUCLEAR MEDICINE LIVER SCAN TECHNIQUE: In conjunction with the interventional radiologist a Y- Microsphere dose was calculated utilizing body surface area formulation. Calculated dose equal 26.6 mCi. Pre therapy MAA liver SPECT scan and CTA were evaluated. Utilizing a microcatheter system, the RIGHT hepatic artery was selected and  Y-90 microspheres were delivered in fractionated aliquots. Radiopharmaceutical was delivered by the interventional radiologist and nuclear radiologist. The patient tolerated procedure well. No adverse effects were noted. Bremsstrahlung planar and SPECT imaging of the abdomen following intrahepatic arterial delivery of Y-90 microsphere was performed. RADIOPHARMACEUTICALS:  243.1millicuries yttrium 90 microspheres COMPARISON:  MAA scan 05/14/2015, CT abdomen 04/06/2015 FINDINGS: Y - 90 microspheres therapy as above. First therapy the right hepatic lobe. Bremsstrahlung planar and SPECT imaging of the abdomen following intrahepatic arterial delivery of Y-938mrosphere demonstrates radioactivity localized to the RIGHT hepatic lobe. No evidence of extrahepatic activity. IMPRESSION: Successful Y - 90 microsphere delivery for treatment of unresectable liver metastasis. First therapy to the RIGHT lobe. Bremssstrahlung scan demonstrates activity localized to RIGHT hepatic lobe with no extrahepatic activity identified. Above Electronically Signed   By: StSuzy Bouchard.D.   On: 05/31/2015 15:47   Nm Special Treatment Procedure  05/31/2015  CLINICAL DATA:  Colorectal carcinoma with unresectable liver metastasis. First therapy to the RIGHT hepatic lobe. EXAM: NUCLEAR MEDICINE SPECIAL MED RAD PHYSICS CONS; NUCLEAR MEDICINE RADIO PHARM THERAPY INTRA ARTERIAL; NUCLEAR MEDICINE TREATMENT PROCEDURE; NUCLEAR MEDICINE LIVER SCAN TECHNIQUE: In conjunction with the interventional radiologist a Y- Microsphere dose was calculated utilizing body surface area formulation. Calculated dose equal 26.6 mCi. Pre therapy MAA liver SPECT scan and CTA were evaluated. Utilizing a microcatheter system, the RIGHT hepatic artery was selected and Y-90 microspheres were delivered in fractionated aliquots.  Radiopharmaceutical was delivered by the interventional radiologist and nuclear radiologist. The patient tolerated procedure well. No adverse  effects were noted. Bremsstrahlung planar and SPECT imaging of the abdomen following intrahepatic arterial delivery of Y-90 microsphere was performed. RADIOPHARMACEUTICALS:  50.0 millicuries yttrium 90 microspheres COMPARISON:  MAA scan 05/14/2015, CT abdomen 04/06/2015 FINDINGS: Y - 90 microspheres therapy as above. First therapy the right hepatic lobe. Bremsstrahlung planar and SPECT imaging of the abdomen following intrahepatic arterial delivery of Y-21mcrosphere demonstrates radioactivity localized to the RIGHT hepatic lobe. No evidence of extrahepatic activity. IMPRESSION: Successful Y - 90 microsphere delivery for treatment of unresectable liver metastasis. First therapy to the RIGHT lobe. Bremssstrahlung scan demonstrates activity localized to RIGHT hepatic lobe with no extrahepatic activity identified. Above Electronically Signed   By: SSuzy BouchardM.D.   On: 05/31/2015 15:47   Nm Radio Pharm Therapy Intraarterial  05/31/2015  CLINICAL DATA:  Colorectal carcinoma with unresectable liver metastasis. First therapy to the RIGHT hepatic lobe. EXAM: NUCLEAR MEDICINE SPECIAL MED RAD PHYSICS CONS; NUCLEAR MEDICINE RADIO PHARM THERAPY INTRA ARTERIAL; NUCLEAR MEDICINE TREATMENT PROCEDURE; NUCLEAR MEDICINE LIVER SCAN TECHNIQUE: In conjunction with the interventional radiologist a Y- Microsphere dose was calculated utilizing body surface area formulation. Calculated dose equal 26.6 mCi. Pre therapy MAA liver SPECT scan and CTA were evaluated. Utilizing a microcatheter system, the RIGHT hepatic artery was selected and Y-90 microspheres were delivered in fractionated aliquots. Radiopharmaceutical was delivered by the interventional radiologist and nuclear radiologist. The patient tolerated procedure well. No adverse effects were noted. Bremsstrahlung planar and SPECT imaging of the abdomen following intrahepatic arterial delivery of Y-90 microsphere was performed. RADIOPHARMACEUTICALS:  237.0millicuries yttrium 90  microspheres COMPARISON:  MAA scan 05/14/2015, CT abdomen 04/06/2015 FINDINGS: Y - 90 microspheres therapy as above. First therapy the right hepatic lobe. Bremsstrahlung planar and SPECT imaging of the abdomen following intrahepatic arterial delivery of Y-985mrosphere demonstrates radioactivity localized to the RIGHT hepatic lobe. No evidence of extrahepatic activity. IMPRESSION: Successful Y - 90 microsphere delivery for treatment of unresectable liver metastasis. First therapy to the RIGHT lobe. Bremssstrahlung scan demonstrates activity localized to RIGHT hepatic lobe with no extrahepatic activity identified. Above Electronically Signed   By: StSuzy Bouchard.D.   On: 05/31/2015 15:47    Labs:  CBC:  Recent Labs  05/14/15 0656 05/22/15 0911 05/31/15 0810 05/31/15 0915  WBC 4.9 8.7 7.3 6.1  HGB 8.0* 8.2* 5.4* 7.9*  HCT 25.7* 27.0* 18.3* 26.2*  PLT 163 237 296 278    COAGS:  Recent Labs  07/05/14 0407 08/25/14 1734 05/14/15 0656 05/31/15 0810  INR 1.20 1.18 1.13 1.11  APTT 35 30 38* 43*    BMP:  Recent Labs  05/08/15 0934 05/14/15 0656 05/22/15 0911 05/31/15 0810  NA 137 139 138 140  K 4.2 3.9 4.0 4.2  CL 102 104 104 105  CO2 27 27 24 27   GLUCOSE 93 95 84 93  BUN 23* 21* 20 21*  CALCIUM 8.9 8.6* 8.6* 8.8*  CREATININE 0.61 0.59 0.56 0.53  GFRNONAA >60 >60 >60 >60  GFRAA >60 >60 >60 >60    LIVER FUNCTION TESTS:  Recent Labs  04/24/15 0821 05/08/15 0934 05/22/15 0911 05/31/15 0810  BILITOT 0.5 0.2* 0.2* 0.2*  AST 38 32 26 25  ALT 37 29 21 31   ALKPHOS 229* 190* 174* 170*  PROT 7.4 7.6 7.1 6.8  ALBUMIN 3.4* 3.5 3.4* 3.2*    Assessment and Plan:  Pt with hx met colon ca to liver, s/p  rt hepatic lobe Y-90 radioembolization earlier today; stable; for d/c home today; f/u with Dr. [name] next week for hgb recheck; we will f/u with pt in IR clinic in 4 weeks; PET scan in 8 weeks  Electronically Signed: D. Rowe Robert 05/31/2015, 4:00 PM   I spent a  total of 15 minutes at the the patient's bedside AND on the patient's hospital floor or unit, greater than 50% of which was counseling/coordinating care for hepatic Y-90 radioembolization

## 2015-05-31 NOTE — Progress Notes (Signed)
CRITICAL VALUE ALERT  Critical value received: hemoglobin 5.4  Date of notification:  05/31/15 Time of notification:  0834  Critical value read back:Yes.    Nurse who received alert:  Beatris Si RN  MD notified (1st page):  Rowe Robert PA Radiology   Time of first page:  [phone] present in department  MD notified (2nd page):  Time of second page:  Responding MD Rowe Robert PA Time MD responded: [phone]

## 2015-05-31 NOTE — Progress Notes (Signed)
Hgb 7.9. Pt is proceed to Radiology for her Y-90 as scheduled

## 2015-05-31 NOTE — Sedation Documentation (Signed)
R groin level 0, 2+ RDP.Marland Kitchen

## 2015-05-31 NOTE — Sedation Documentation (Signed)
R groin level 0, 2+RDP.

## 2015-05-31 NOTE — Sedation Documentation (Signed)
5Fr sheath removed from R femoral artery by Dr. Kathlene Cote.  Hemostasis achieved using Exoseal closure device.  Groin level 0, 2+RDP. Manual pressure being held.

## 2015-06-01 ENCOUNTER — Other Ambulatory Visit: Payer: Self-pay | Admitting: Radiology

## 2015-06-01 DIAGNOSIS — C787 Secondary malignant neoplasm of liver and intrahepatic bile duct: Principal | ICD-10-CM

## 2015-06-01 DIAGNOSIS — C189 Malignant neoplasm of colon, unspecified: Secondary | ICD-10-CM

## 2015-06-01 LAB — CBC WITH DIFFERENTIAL/PLATELET
Basophils Absolute: 0 10*3/uL (ref 0.0–0.1)
Basophils Relative: 0 %
EOS ABS: 0.1 10*3/uL (ref 0.0–0.7)
EOS PCT: 2 %
HCT: 26.2 % — ABNORMAL LOW (ref 36.0–46.0)
Hemoglobin: 7.9 g/dL — ABNORMAL LOW (ref 12.0–15.0)
LYMPHS ABS: 2.2 10*3/uL (ref 0.7–4.0)
Lymphocytes Relative: 36 %
MCH: 27 pg (ref 26.0–34.0)
MCHC: 30.2 g/dL (ref 30.0–36.0)
MCV: 89.4 fL (ref 78.0–100.0)
MONOS PCT: 15 %
Monocytes Absolute: 0.9 10*3/uL (ref 0.1–1.0)
Neutro Abs: 2.8 10*3/uL (ref 1.7–7.7)
Neutrophils Relative %: 47 %
PLATELETS: 278 10*3/uL (ref 150–400)
RBC: 2.93 MIL/uL — ABNORMAL LOW (ref 3.87–5.11)
RDW: 16.5 % — AB (ref 11.5–15.5)
WBC: 6.1 10*3/uL (ref 4.0–10.5)

## 2015-06-05 ENCOUNTER — Encounter (HOSPITAL_COMMUNITY): Payer: Commercial Managed Care - HMO | Attending: Hematology & Oncology

## 2015-06-05 ENCOUNTER — Ambulatory Visit (HOSPITAL_COMMUNITY): Payer: Commercial Managed Care - HMO | Admitting: Hematology & Oncology

## 2015-06-05 ENCOUNTER — Inpatient Hospital Stay (HOSPITAL_COMMUNITY): Payer: Commercial Managed Care - HMO

## 2015-06-05 ENCOUNTER — Ambulatory Visit (HOSPITAL_COMMUNITY): Payer: Commercial Managed Care - HMO | Admitting: Oncology

## 2015-06-05 DIAGNOSIS — R112 Nausea with vomiting, unspecified: Secondary | ICD-10-CM | POA: Insufficient documentation

## 2015-06-05 DIAGNOSIS — C787 Secondary malignant neoplasm of liver and intrahepatic bile duct: Secondary | ICD-10-CM | POA: Diagnosis present

## 2015-06-05 DIAGNOSIS — C189 Malignant neoplasm of colon, unspecified: Secondary | ICD-10-CM | POA: Diagnosis not present

## 2015-06-05 DIAGNOSIS — D500 Iron deficiency anemia secondary to blood loss (chronic): Secondary | ICD-10-CM

## 2015-06-05 DIAGNOSIS — E876 Hypokalemia: Secondary | ICD-10-CM | POA: Insufficient documentation

## 2015-06-05 LAB — CBC
HCT: 29 % — ABNORMAL LOW (ref 36.0–46.0)
HEMOGLOBIN: 8.7 g/dL — AB (ref 12.0–15.0)
MCH: 26.4 pg (ref 26.0–34.0)
MCHC: 30 g/dL (ref 30.0–36.0)
MCV: 87.9 fL (ref 78.0–100.0)
PLATELETS: 277 10*3/uL (ref 150–400)
RBC: 3.3 MIL/uL — AB (ref 3.87–5.11)
RDW: 16.5 % — ABNORMAL HIGH (ref 11.5–15.5)
WBC: 6.8 10*3/uL (ref 4.0–10.5)

## 2015-06-07 ENCOUNTER — Encounter (HOSPITAL_COMMUNITY): Payer: Commercial Managed Care - HMO

## 2015-06-08 ENCOUNTER — Other Ambulatory Visit (HOSPITAL_COMMUNITY): Payer: Self-pay | Admitting: Interventional Radiology

## 2015-06-08 DIAGNOSIS — C787 Secondary malignant neoplasm of liver and intrahepatic bile duct: Principal | ICD-10-CM

## 2015-06-08 DIAGNOSIS — C189 Malignant neoplasm of colon, unspecified: Secondary | ICD-10-CM

## 2015-06-08 NOTE — Progress Notes (Signed)
This encounter was created in error - please disregard.

## 2015-06-12 ENCOUNTER — Encounter (HOSPITAL_BASED_OUTPATIENT_CLINIC_OR_DEPARTMENT_OTHER): Payer: Commercial Managed Care - HMO | Admitting: Hematology & Oncology

## 2015-06-12 ENCOUNTER — Ambulatory Visit (HOSPITAL_COMMUNITY): Payer: Commercial Managed Care - HMO | Admitting: Hematology & Oncology

## 2015-06-12 ENCOUNTER — Encounter (HOSPITAL_COMMUNITY): Payer: Self-pay | Admitting: Hematology & Oncology

## 2015-06-12 ENCOUNTER — Encounter (HOSPITAL_COMMUNITY): Payer: Commercial Managed Care - HMO

## 2015-06-12 VITALS — BP 114/57 | HR 81 | Temp 98.9°F | Resp 18 | Wt 106.0 lb

## 2015-06-12 DIAGNOSIS — E46 Unspecified protein-calorie malnutrition: Secondary | ICD-10-CM

## 2015-06-12 DIAGNOSIS — C189 Malignant neoplasm of colon, unspecified: Secondary | ICD-10-CM

## 2015-06-12 DIAGNOSIS — D638 Anemia in other chronic diseases classified elsewhere: Secondary | ICD-10-CM

## 2015-06-12 DIAGNOSIS — C19 Malignant neoplasm of rectosigmoid junction: Secondary | ICD-10-CM

## 2015-06-12 DIAGNOSIS — D6481 Anemia due to antineoplastic chemotherapy: Secondary | ICD-10-CM

## 2015-06-12 DIAGNOSIS — C787 Secondary malignant neoplasm of liver and intrahepatic bile duct: Secondary | ICD-10-CM | POA: Diagnosis not present

## 2015-06-12 DIAGNOSIS — T451X5A Adverse effect of antineoplastic and immunosuppressive drugs, initial encounter: Secondary | ICD-10-CM

## 2015-06-12 DIAGNOSIS — D649 Anemia, unspecified: Secondary | ICD-10-CM | POA: Diagnosis not present

## 2015-06-12 LAB — CBC WITH DIFFERENTIAL/PLATELET
BASOS ABS: 0 10*3/uL (ref 0.0–0.1)
Basophils Relative: 1 %
EOS ABS: 0.2 10*3/uL (ref 0.0–0.7)
EOS PCT: 3 %
HCT: 28.5 % — ABNORMAL LOW (ref 36.0–46.0)
Hemoglobin: 8.5 g/dL — ABNORMAL LOW (ref 12.0–15.0)
LYMPHS PCT: 18 %
Lymphs Abs: 0.9 10*3/uL (ref 0.7–4.0)
MCH: 26.3 pg (ref 26.0–34.0)
MCHC: 29.8 g/dL — ABNORMAL LOW (ref 30.0–36.0)
MCV: 88.2 fL (ref 78.0–100.0)
Monocytes Absolute: 1 10*3/uL (ref 0.1–1.0)
Monocytes Relative: 20 %
Neutro Abs: 2.9 10*3/uL (ref 1.7–7.7)
Neutrophils Relative %: 58 %
PLATELETS: 266 10*3/uL (ref 150–400)
RBC: 3.23 MIL/uL — AB (ref 3.87–5.11)
RDW: 16.8 % — ABNORMAL HIGH (ref 11.5–15.5)
WBC: 5 10*3/uL (ref 4.0–10.5)

## 2015-06-12 LAB — COMPREHENSIVE METABOLIC PANEL
ALBUMIN: 3.2 g/dL — AB (ref 3.5–5.0)
ALT: 20 U/L (ref 14–54)
AST: 27 U/L (ref 15–41)
Alkaline Phosphatase: 186 U/L — ABNORMAL HIGH (ref 38–126)
Anion gap: 8 (ref 5–15)
BUN: 19 mg/dL (ref 6–20)
CHLORIDE: 102 mmol/L (ref 101–111)
CO2: 28 mmol/L (ref 22–32)
CREATININE: 0.61 mg/dL (ref 0.44–1.00)
Calcium: 9 mg/dL (ref 8.9–10.3)
GFR calc non Af Amer: >60 mL/min (ref >60)
Glucose, Bld: 111 mg/dL — ABNORMAL HIGH (ref 65–99)
POTASSIUM: 3.6 mmol/L (ref 3.5–5.1)
SODIUM: 138 mmol/L (ref 135–145)
TOTAL PROTEIN: 7.3 g/dL (ref 6.5–8.1)
Total Bilirubin: 0.2 mg/dL — ABNORMAL LOW (ref 0.3–1.2)

## 2015-06-12 NOTE — Progress Notes (Signed)
Patient not treated per MD discretion.

## 2015-06-12 NOTE — Patient Instructions (Addendum)
[place] at [facility] Discharge Instructions  RECOMMENDATIONS MADE BY THE CONSULTANT AND ANY TEST RESULTS WILL BE SENT TO YOUR REFERRING PHYSICIAN.    Exam and discussion by Dr [name] today Lab work looks good  Labs prior to chemotherapy next week  Return next week for Chemotherapy  Return to see the doctor in 1 week  Please call the clinic if you have any questions or concerns     Thank you for choosing [place] at [facility] to provide your oncology and hematology care.  To afford each patient quality time with our provider, please arrive at least 15 minutes before your scheduled appointment time.   Beginning January 23rd 2017 lab work for the [facility] will be done in the  Main lab at [facility] on 1st floor. If you have a lab appointment with the [facility] please come in thru the  Main Entrance and check in at the main information desk  You need to re-schedule your appointment should you arrive 10 or more minutes late.  We strive to give you quality time with our providers, and arriving late affects you and other patients whose appointments are after yours.  Also, if you no show three or more times for appointments you may be dismissed from the clinic at the providers discretion.     Again, thank you for choosing [facility].  Our hope is that these requests will decrease the amount of time that you wait before being seen by our physicians.       _____________________________________________________________  Should you have questions after your visit to [facility], please contact our office at [phone] between the hours of 8:30 a.m. and 4:30 p.m.  Voicemails left after 4:30 p.m. will not be returned until the following business day.  For prescription refill requests, have your pharmacy contact our office.         Resources For Cancer Patients and their Caregivers ? American [employer]  Society: Can assist with transportation, wigs, general needs, runs Look Good Feel Better.        909-652-5972 ? Cancer Care: Provides financial assistance, online support groups, medication/co-pay assistance.  1-800-813-HOPE 936 027 5748) ? [place] Assists [place] cancer patients and their families through emotional , educational and financial support.  305-776-0858 ? [place] DSS Where to apply for food stamps, Medicaid and utility assistance. 417-467-6543 ? [facility]: Transportation to medical appointments. 925 739 1112 ? Social Security Administration: May apply for disability if have a Stage IV cancer. 858 114 7914 (650)487-4460 ? [employer], Disability and Transit Services: Assists with nutrition, care and transit needs. ([phone] [phone]

## 2015-06-12 NOTE — Progress Notes (Signed)
Path request for MSI by IHC/PCR and HER2 done today.

## 2015-06-12 NOTE — Progress Notes (Signed)
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 Diffuse liver metastases   07/02/2014 Imaging Changes consistent with the known history of colon carcinoma with metastatic disease involving the liver and peritoneum. Recent stent placement within the colonic lesion. Some fecal material is noted within the proximal portion of the stent which a...   07/05/2014 Pathology Results Liver, needle/core biopsy, left lobe - METASTATIC ADENOCARCINOMA   07/19/2014 - 08/16/2014 Chemotherapy FOLFOX   07/23/2014 Imaging Similar findings of advanced stage IV metastatic colon cancer with perhaps slight interval progression of disease in the liver.   08/25/2014 Treatment Plan Change Chemotherapy held due to hospitalization   08/25/2014 - 09/21/2014 Hospital Admission SBO   08/26/2014 Imaging High-grade distal small bowel obstruction, likely from peritoneal metastatic disease.  Extensive hepatic metastatic disease. The largest metastasis in the left lobe has decreased from 07/23/2014.   08/31/2014 Surgery Colon, segmental resection for tumor, rectum - INVASIVE ADENOCARCINOMA, MODERATELY DIFFERENTIATED - TUMOR INVADES THROUGH SEROSA, SEE COMMENT. - PERFORATION WITH VISIBLE STENT. - LYMPHOVASCULAR AND PERINEURAL INVASION IS PRESENT. - DISTAL AND PROX...   09/15/2014 Imaging No bowel obstruction suspected status post multifocal small and large bowel resection with both primary reanastomoses and descending colostomy. Patulous appearance of colon in the right abdomen might reflect focal ileus.   11/06/2014 - 01/17/2015 Chemotherapy FOLFOX   11/28/2014 -  11/30/2014 Hospital Admission 1.   Nausea and vomiting 2.   Constipation    01/18/2015 - 01/22/2015 Hospital Admission Nausea and vomiting, UTI (lower urinary tract infection)   01/29/2015 Progression CT CAP- Progressive hepatic metastatic disease. New and enlarging heterogeneous nodules and masses in the liver measure up to 5.2 x 6.7 cm (image 56), previously 3.3 x 5.1 cm on 09/15/2014.   02/12/2015 -  Chemotherapy FOLFIRI, patient refuses Avastin.  5FU bolus deleted without administration beginning on cycle 1.   04/06/2015 Imaging CT C/A/P mixed response with decreased size of several hepatic lesions, increased size of others, dominant lesion in RL of liver 8.1x5.2x8.4cm, large serosal implant overlying RL of liver appears increased   05/31/2015 Procedure Arteriography and Y-90 embolization performed of replaced R hepatic artery off SMA. 26 mCI prescribed dose of SIR spheres    CURRENT THERAPY: FOLFIRI  INTERVAL HISTORY: Sydney Morrison 60 y.o. 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 Michael Boston on 08/31/2014 when she was admitted with SBO.   Mrs. Scinto returns to the [facility] today accompanied by her husband, Jenny Reichmann.   She notes that she's been feeling a little better as she's been progressing on after the Y90, but that she's still extremely tired. She feels that she is "shedding" cancer cells in her urine since it was initially dark after the procedure.  Mrs. Bento confirms that she is eating, hungry for breakfast, hungry for lunch, drinks her ensure in the afternoon, but always gets nauseous at dinner. She says now she makes herself eat dinner."  She goes back to see Dr. Kathlene Cote in April.  Mrs. Kracke says she hasn't been up to anything lately but "reading, watching TV, laying on the  couch, and sleeping." She says that if she hadn't been laying around all week, she'd be up for receiving treatment today, but as it stands, she doesn't feel well enough to withstand chemo. We discussed how important it is to maintain control of her abdominal disease and reviewed our conversations prior to her Y-90 therapy.    Mrs. Robinson was confused about the nature of the disease control in her abdomen, noting "I thought there was no more cancer in my abdomen at all." She was reminded that this has been discussed extensively with her in the past, regarding her de-bulking surgery and the fact that the disease in her abdomen is controlled at the moment, but not completely eradicated. Her husband confirmed that we have discussed this many times as well.   Past Medical History  Diagnosis Date  . Crohn's disease ([place])   . Chronic headache   . Cancer ([place])   . Nausea and vomiting 08/25/2014  . Sinus infection   . 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 [id number]; Chronic blood loss anemia; Antineoplastic chemotherapy induced anemia; Dehydration; Chemotherapy-induced neuropathy ([place]); Nausea and vomiting; SBO (small bowel obstruction) ([place]); Atrial flutter ([place]); Goals of care, counseling/discussion; Screen for STD (sexually transmitted disease); Pressure ulcer, stage 1; Postoperative fever; Leucocytosis; Protein-calorie malnutrition, severe ([place]); Tobacco abuse; Encounter for palliative care; Hyponatremia; Intolerance, food ([place]); Intractable nausea and vomiting; Constipation; Anemia of chronic disease; Thrombocytopenia ([place]); and UTI (lower urinary tract infection) on her problem list.     is allergic to avelox; codeine; dextromethorphan; erythromycin; penicillins; percocet; reglan; zofran; clindamycin hcl; red dye; and suprep.  Current Outpatient Prescriptions on File Prior to Visit  Medication Sig Dispense Refill    . acetaminophen (TYLENOL) 325 MG tablet Take 325 mg by mouth every 6 (six) hours as needed for fever or headache (headahce).     . ALPRAZolam (XANAX) 0.5 MG tablet Take 0.125-0.5 mg by mouth at bedtime as needed for sleep.     Marland Kitchen aprepitant (EMEND) 40 MG capsule Take 40 mg by mouth daily.    . benzocaine (ZILACTIN-B) 10 % mucosal gel Use as directed 1 application in the mouth or throat as needed for mouth pain. Reported on 03/27/2015    . cromolyn (NASALCROM) 5.2 MG/ACT nasal spray Place 1 spray into both nostrils at bedtime.    . Cyanocobalamin (VITAMIN B-12 CR) 1500 MCG TBCR Take 1 tablet by mouth daily.    Marland Kitchen ENSURE (ENSURE) Take 237 mLs by mouth 2 (two) times daily with a meal.    . Fluorouracil (ADRUCIL IV) Inject into the vein every 14 (fourteen) days.    Burnadette Peter OIL Apply 1 application topically daily.    Marland Kitchen GENERLAC 10 GM/15ML SOLN Take 15 mLs (10 g total) by mouth daily as needed (for constipation). 2700 mL 2  . irinotecan in dextrose 5 % 500 mL Inject into the vein once.    Marland Kitchen LEUCOVORIN CALCIUM IV Inject into the vein. Reported on 03/27/2015    . lidocaine-prilocaine (EMLA) cream Apply 1 application topically as needed. Apply to portacath site 1-2 hours prior to use 30 g 3  . loperamide (IMODIUM A-D) 2 MG tablet Take 2 mg by mouth 4 (four) times daily as needed for diarrhea or loose stools. Reported on 05/08/2015    . LORazepam (ATIVAN)  0.5 MG tablet Take 1 tablet (0.5 mg total) by mouth every 6 (six) hours as needed (nausea/vomiting). May take SL if concern for vomiting exists 45 tablet 2  . Multiple Vitamins-Minerals (MULTIVITAMIN WITH MINERALS) tablet Take 1 tablet by mouth daily.    . nicotine (NICODERM CQ - DOSED IN MG/24 HR) 7 mg/24hr patch Place 7 mg onto the skin daily.    Marland Kitchen dexamethasone (DECADRON) 4 MG tablet Take 1 tablet (4 mg total) by mouth 2 (two) times daily. For 2 days. Begin day of pump disconnect. (Patient not taking: Reported on 05/08/2015) 8 tablet 2  .  diphenoxylate-atropine (LOMOTIL) 2.5-0.025 MG tablet May take 1-2 tablets four times a day as needed for loose stools. (Patient not taking: Reported on 05/08/2015) 45 tablet 0   Current Facility-Administered Medications on File Prior to Visit  Medication Dose Route Frequency Provider Last Rate Last Dose  . atropine injection 0.5 mg  0.5 mg Intravenous Once PRN Patrici Ranks, MD   0.5 mg at 02/12/15 1001  . heparin lock flush 100 unit/mL  500 Units Intracatheter Once PRN Patrici Ranks, MD      . promethazine (PHENERGAN) injection 12.5 mg  12.5 mg Intravenous Q4H PRN Patrici Ranks, MD   12.5 mg at 02/12/15 1000  . sodium chloride 0.9 % injection 10 mL  10 mL Intracatheter PRN Patrici Ranks, MD        Past Surgical History  Procedure Laterality Date  . Breast surgery    . Uterine fibroid surgery    . Colonoscopy w/ biopsies  06/29/2014    DR HUNG  . Flexible sigmoidoscopy N/A 06/30/2014    Procedure: FLEXIBLE SIGMOIDOSCOPY;  Surgeon: Carol Ada, MD;  Location: [facility] ENDOSCOPY;  Service: Endoscopy;  Laterality: N/A;  . Colonic stent placement N/A 06/30/2014    Procedure: COLONIC STENT PLACEMENT;  Surgeon: Carol Ada, MD;  Location: [facility] ENDOSCOPY;  Service: Endoscopy;  Laterality: N/A;  with fluro   . Portacath placement N/A 07/04/2014    Procedure: POWER PORT PLACEMENT;  Surgeon: Alphonsa Overall, MD;  Location: [facility];  Service: General;  Laterality: N/A;  . Laparotomy N/A 08/31/2014    Procedure: EXPLORATORY LAPAROTOMY;  Surgeon: Michael Boston, MD;  Location: [facility] ORS;  Service: General;  Laterality: N/A;  . Bowel resection N/A 08/31/2014    Procedure: SMALL BOWEL RESECTION;  Surgeon: Michael Boston, MD;  Location: [facility];  Service: General;  Laterality: N/A;  . Colostomy N/A 08/31/2014    Procedure: low anterior resection with COLOSTOMY;  Surgeon: Michael Boston, MD;  Location: [facility];  Service: General;  Laterality: N/A;  . Appendectomy  08/31/2014    Procedure: APPENDECTOMY;  Surgeon: Michael Boston, MD;  Location: [facility];  Service: General;;  . Supracervical abdominal hysterectomy  08/31/2014    Procedure: HYSTERECTOMY SUPRACERVICAL ABDOMINAL BILATERAL TUBES AND OVARIES;  Surgeon: Michael Boston, MD;  Location: [facility];  Service: General;;  . Transverse colon resection  08/31/2014    Procedure: TRANSVERSE COLON RESECTION;  Surgeon: Michael Boston, MD;  Location: [facility];  Service: General;;  . Debulking  08/31/2014    Procedure: DEBULKING OF PERITONEUM;  Surgeon: Michael Boston, MD;  Location: [facility];  Service: General;;    Denies any headaches, dizziness, double vision, chills, night sweats, nausea, constipation, chest pain, heart palpitations, shortness of breath, blood in stool, black tarry stool, urinary pain, urinary burning, urinary frequency, hematuria. 14 point review of systems was performed and is negative except as detailed under  history of present illness and above   PHYSICAL EXAMINATION ECOG PERFORMANCE STATUS: 2 - Symptomatic, <50% confined to bed   Vitals - 1 value per visit 5/53/7482  SYSTOLIC 707  DIASTOLIC 57  Pulse 81  Temperature 98.9  Respirations 18  Weight (lb) 106  Height   BMI 16.6    GENERAL:alert, no distress, cachectic, cooperative, smiling and accompanied by her husband, John.  SKIN: skin color, texture, turgor are normal HEAD: Normocephalic, No masses, lesions, tenderness or abnormalities EYES: normal, PERRLA, EOMI, Conjunctiva are pink and non-injected EARS: External ears normal OROPHARYNX:lips, buccal mucosa, and tongue normal and mucous membranes are moist  NECK: supple, trachea midline LYMPH:  No palpable adenopathy in the neck, supraclavicular region or axillae BREAST:not examined LUNGS: clear to auscultation and percussion HEART: regular rate & rhythm, no murmurs, no gallops, S1 normal and S2 normal ABDOMEN:abdomen soft, non-tender, normal bowel sounds and ostomy noted.  Small stomal hernia. Very thin BACK: Back symmetric, no curvature, sacral  area with mild erythema, no active skin breakdown EXTREMITIES:less then 2 second capillary refill, no joint deformities, effusion, or inflammation, no skin discoloration, no cyanosis  NEURO: alert & oriented x 3 with fluent speech, no focal motor/sensory deficits   LABORATORY DATA: I have reviewed the data below as listed.   Results for MADGELINE, RAYO (MRN [id number])   Ref. Range 06/12/2015 08:54  Sodium Latest Ref Range: 135-145 mmol/L 138  Potassium Latest Ref Range: 3.5-5.1 mmol/L 3.6  Chloride Latest Ref Range: 101-111 mmol/L 102  CO2 Latest Ref Range: 22-32 mmol/L 28  BUN Latest Ref Range: 6-20 mg/dL 19  Creatinine Latest Ref Range: 0.44-1.00 mg/dL 0.61  Calcium Latest Ref Range: 8.9-10.3 mg/dL 9.0  EGFR (Non-African Amer.) Latest Ref Range: >60 mL/min >60  EGFR (African American) Latest Ref Range: >60 mL/min >60  Glucose Latest Ref Range: 65-99 mg/dL 111 (H)  Anion gap Latest Ref Range: 5-15  8  Alkaline Phosphatase Latest Ref Range: 38-126 U/L 186 (H)  Albumin Latest Ref Range: 3.5-5.0 g/dL 3.2 (L)  AST Latest Ref Range: 15-41 U/L 27  ALT Latest Ref Range: 14-54 U/L 20  Total Protein Latest Ref Range: 6.5-8.1 g/dL 7.3  Total Bilirubin Latest Ref Range: 0.3-1.2 mg/dL 0.2 (L)  WBC Latest Ref Range: 4.0-10.5 K/uL 5.0  RBC Latest Ref Range: 3.87-5.11 MIL/uL 3.23 (L)  Hemoglobin Latest Ref Range: 12.0-15.0 g/dL 8.5 (L)  HCT Latest Ref Range: 36.0-46.0 % 28.5 (L)  MCV Latest Ref Range: 78.0-100.0 fL 88.2  MCH Latest Ref Range: 26.0-34.0 pg 26.3  MCHC Latest Ref Range: 30.0-36.0 g/dL 29.8 (L)  RDW Latest Ref Range: 11.5-15.5 % 16.8 (H)  Platelets Latest Ref Range: 150-400 K/uL 266  Neutrophils Latest Units: % 58  Lymphocytes Latest Units: % 18  Monocytes Relative Latest Units: % 20  Eosinophil Latest Units: % 3  Basophil Latest Units: % 1  NEUT# Latest Ref Range: 1.7-7.7 K/uL 2.9  Lymphocyte # Latest Ref Range: 0.7-4.0 K/uL 0.9  Monocyte # Latest Ref Range: 0.1-1.0 K/uL 1.0   Eosinophils Absolute Latest Ref Range: 0.0-0.7 K/uL 0.2  Basophils Absolute Latest Ref Range: 0.0-0.1 K/uL 0.0    PATHOLOGY:  Diagnosis 1. Soft tissue mass, biopsy, peritoneal mass dome of bladder - METASTATIC ADENOCARCINOMA. 2. Appendix, Incidental - METASTATIC ADENOCARCINOMA. 3. Small intestine, resection, ileum - METASTATIC ADENOCARCINOMA. - RESECTION MARGINS ARE NEGATIVE. 4. Colon, segmental resection, with omentum, transverse - METASTATIC ADENOCARCINOMA INVOLVING COLON AND OMENTUM. - RESECTION MARGINS ARE NEGATIVE. 5. Soft tissue, biopsy, right pelvic  sidewall nodule - METASTATIC ADENOCARCINOMA. 6. Soft tissue mass, biopsy, anterior peritoneum mass near dome - METASTATIC ADENOCARCINOMA. 7. Colon, segmental resection for tumor, rectum - INVASIVE ADENOCARCINOMA, MODERATELY DIFFERENTIATED - TUMOR INVADES THROUGH SEROSA, SEE COMMENT. - PERFORATION WITH VISIBLE STENT. - LYMPHOVASCULAR AND PERINEURAL INVASION IS PRESENT. - DISTAL AND PROXIMAL RESECTION MARGINS ARE NEGATIVE, SEE COMMENT FOR RADIAL MARGIN. - METASTATIC CARCINOMA IN FOUR OF FOURTEEN LYMPH NODES (4/14). - SEROSAL METASTATIC NODULES AND SATELLITE NODULES PRESENT. - SEE ONCOLOGY TABLE AND COMMENT.    RADIOLOGY: I have personally reviewed the radiological images as listed and agreed with the findings in the report.  CLINICAL DATA: 60 year old female with history of metastatic colon cancer, status post surgical resection and chemotherapy. Additional history of Crohn's disease.  EXAM: CT CHEST, ABDOMEN, AND PELVIS WITH CONTRAST  TECHNIQUE: Multidetector CT imaging of the chest, abdomen and pelvis was performed following the standard protocol during bolus administration of intravenous contrast.  CONTRAST: 110m OMNIPAQUE IOHEXOL 300 MG/ML SOLN  COMPARISON: CT of the chest, abdomen and pelvis 01/29/2015.  FINDINGS: CT CHEST FINDINGS  Mediastinum/Lymph Nodes: Heart size is normal. There is  no significant pericardial fluid, thickening or pericardial calcification. No pathologically enlarged mediastinal or hilar lymph nodes. 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 Esophagus is unremarkable in appearance. No axillary lymphadenopathy.  Lungs/Pleura: 4 mm subpleural nodule in the posterior aspect of the left lower lobe (image 30 of series 6), unchanged compared to prior study 06/29/2014, presumably benign, likely a subpleural lymph node. Likewise, a 3 mm subpleural nodule in the left lower lobe (image 51 of series 6) is also unchanged compared to prior study 06/29/2014, also likely a subpleural lymph node. No other suspicious appearing pulmonary nodules or masses are otherwise noted. Mild linear scarring in the base of the left lower lobe. No acute consolidative airspace disease. No pleural effusions.  Musculoskeletal/Soft Tissues: There are no aggressive appearing lytic or blastic lesions noted in the visualized portions of the skeleton.  CT ABDOMEN AND PELVIS FINDINGS  Hepatobiliary: Again noted are numerous hypovascular lesions scattered throughout the liver, compatible with widespread metastatic disease to the liver. Although several of the smaller lesions appear smaller than the prior examination and overall appear less numerous, suggesting some positive response to therapy, other lesions have clearly enlarged compared to the prior examination. A specific example of a shrinking lesion is a 2.3 x 3.8 x 3.3 cm lesion at the junction of segments 2 and 3 (image 62 of series 2), which previously measured 2.5 x 4.0 x 3.5 cm on prior study 01/29/2015. The largest lesion in the central aspect of the right lobe of the liver has enlarged and currently measures 8.1 x 5.2 x 8.4 cm (image 60 of series 2 and coronal image 31 of series 3), previously 6.7 x 5.2 x 7.2 cm on 01/29/2015. Additionally, several satellite lesions to this are  increased in size, most notably a 2.8 x 2.2 cm lesion in segment 5 (image 70 of series 2). No intra or extrahepatic biliary duct of dilatation. Gallbladder is normal in appearance. In addition, there is an enlarging 1.6 x 5.0 cm low-attenuation lesion associated with the liver capsule overlying segments 5 and 8 (image 59 of series 2) which is more evident than on prior studies, likely to represent a serosal implant.  Pancreas: No pancreatic mass. No pancreatic ductal dilatation. No pancreatic or peripancreatic fluid or inflammatory changes.  Spleen: Unremarkable.  Adrenals/Urinary Tract: Bilateral adrenal glands and bilateral kidneys are normal in  appearance. No hydroureteronephrosis. Urinary bladder is normal in appearance.  Stomach/Bowel: Normal appearance of the stomach. Status post low anterior resection with left lower quadrant colostomy. Additionally, there is a suture line in the proximal transverse colon, suggesting prior partial colectomy in this region as well. No pathologic dilatation of small bowel or colon. Status post omentectomy.  Vascular/Lymphatic: Atherosclerotic calcifications are noted throughout the abdominal and pelvic vasculature, without evidence of aneurysm or dissection. No lymphadenopathy noted in the abdomen or pelvis.  Reproductive: Status post hysterectomy. Ovaries are not confidently identified may be surgically absent or atrophic.  Other: No significant volume of ascites. No pneumoperitoneum.  Musculoskeletal: There are no aggressive appearing lytic or blastic lesions noted in the visualized portions of the skeleton.  IMPRESSION: 1. Today's study demonstrates a mixed response to therapy with decreased size of several hepatic metastases, but increased size of others, including the dominant lesion in the right lobe of the liver which currently measures 8.1 x 5.2 x 8.4 cm. Additionally, a large serosal implant overlying the right lobe of  the liver also appears increased in size compared to the prior examination. 2. No new signs of metastatic disease elsewhere in the chest, abdomen or pelvis. 3. Status post low anterior resection, omentectomy, partial transverse colectomy and left lower quadrant colostomy. 4. Additional findings, as above, similar prior studies.   Electronically Signed  By: Vinnie Langton M.D.  On: 04/06/2015 14:29  ASSESSMENT AND PLAN:  Stage IV CRC Cachexia Sacral Decubitus, healed Anemia, multifactorial Kras mutation Y-90 embolization RL liver  Her anemia is chronic and secondary to disease, malnutrition and treatment we will continue to monitor this. I have encouraged ongoing improvement in her nutritional status.  I have recommended that she consider treatment today, her labs are at her baseline. I have reassured her that I have spoken with Dr. Kathlene Cote.  Her disease is such that delays in therapy will be detrimental for her. I emphasized this to her again today.  We will ask Hildred Alamin about MSI testing and HER2 testing on Mrs. Eulas Post.  Although we will hold her treatment today, it is strongly advised that she receive treatment next week. She will return in one week for ongoing assessment with repeat CBC, CMP.  All questions were answered. The patient knows to call the clinic with any problems, questions or concerns. We can certainly see the patient much sooner if necessary.   This document serves as a record of services personally performed by Ancil Linsey, MD. It was created on her behalf by Toni Amend, a trained medical scribe. The creation of this record is based on the scribe's personal observations and the provider's statements to them. This document has been checked and approved by the attending provider.  I have reviewed the above documentation for accuracy and completeness, and I agree with the above.  This note was electronically signed.  Kelby Fam. [name], MD

## 2015-06-13 LAB — CEA: CEA: 39.7 ng/mL — ABNORMAL HIGH (ref 0.0–4.7)

## 2015-06-18 ENCOUNTER — Inpatient Hospital Stay (HOSPITAL_COMMUNITY): Payer: Commercial Managed Care - HMO

## 2015-06-18 ENCOUNTER — Emergency Department (HOSPITAL_COMMUNITY)
Admission: EM | Admit: 2015-06-18 | Discharge: 2015-06-18 | Disposition: A | Discharge status: Home / Self Care | Payer: Commercial Managed Care - HMO | Attending: Emergency Medicine | Admitting: Emergency Medicine

## 2015-06-18 ENCOUNTER — Ambulatory Visit (HOSPITAL_COMMUNITY): Payer: Commercial Managed Care - HMO | Admitting: Oncology

## 2015-06-18 ENCOUNTER — Emergency Department (HOSPITAL_COMMUNITY): Payer: Commercial Managed Care - HMO

## 2015-06-18 ENCOUNTER — Encounter (HOSPITAL_COMMUNITY): Payer: Self-pay | Admitting: Emergency Medicine

## 2015-06-18 DIAGNOSIS — C787 Secondary malignant neoplasm of liver and intrahepatic bile duct: Secondary | ICD-10-CM | POA: Diagnosis not present

## 2015-06-18 DIAGNOSIS — Z87891 Personal history of nicotine dependence: Secondary | ICD-10-CM | POA: Insufficient documentation

## 2015-06-18 DIAGNOSIS — R1011 Right upper quadrant pain: Secondary | ICD-10-CM | POA: Diagnosis not present

## 2015-06-18 DIAGNOSIS — Z9049 Acquired absence of other specified parts of digestive tract: Secondary | ICD-10-CM | POA: Diagnosis not present

## 2015-06-18 DIAGNOSIS — Z85038 Personal history of other malignant neoplasm of large intestine: Secondary | ICD-10-CM | POA: Insufficient documentation

## 2015-06-18 DIAGNOSIS — R109 Unspecified abdominal pain: Secondary | ICD-10-CM | POA: Diagnosis present

## 2015-06-18 DIAGNOSIS — Z79899 Other long term (current) drug therapy: Secondary | ICD-10-CM | POA: Diagnosis not present

## 2015-06-18 LAB — COMPREHENSIVE METABOLIC PANEL
ALBUMIN: 3.2 g/dL — AB (ref 3.5–5.0)
ALK PHOS: 194 U/L — AB (ref 38–126)
ALT: 36 U/L (ref 14–54)
ANION GAP: 8 (ref 5–15)
AST: 43 U/L — ABNORMAL HIGH (ref 15–41)
BUN: 22 mg/dL — ABNORMAL HIGH (ref 6–20)
CO2: 29 mmol/L (ref 22–32)
Calcium: 8.9 mg/dL (ref 8.9–10.3)
Chloride: 101 mmol/L (ref 101–111)
Creatinine, Ser: 0.5 mg/dL (ref 0.44–1.00)
GFR calc Af Amer: >60 mL/min (ref >60)
GFR calc non Af Amer: >60 mL/min (ref >60)
GLUCOSE: 136 mg/dL — AB (ref 65–99)
POTASSIUM: 3.7 mmol/L (ref 3.5–5.1)
Sodium: 138 mmol/L (ref 135–145)
Total Bilirubin: 0.2 mg/dL — ABNORMAL LOW (ref 0.3–1.2)
Total Protein: 7.2 g/dL (ref 6.5–8.1)

## 2015-06-18 LAB — URINALYSIS, ROUTINE W REFLEX MICROSCOPIC
Bilirubin Urine: NEGATIVE
Glucose, UA: NEGATIVE mg/dL
Hgb urine dipstick: NEGATIVE
Ketones, ur: NEGATIVE mg/dL
LEUKOCYTES UA: NEGATIVE
Nitrite: NEGATIVE
PROTEIN: NEGATIVE mg/dL
SPECIFIC GRAVITY, URINE: 1.025 (ref 1.005–1.030)
pH: 5 (ref 5.0–8.0)

## 2015-06-18 LAB — I-STAT CG4 LACTIC ACID, ED: Lactic Acid, Venous: 1.15 mmol/L (ref 0.5–2.0)

## 2015-06-18 LAB — LIPASE, BLOOD: Lipase: 28 U/L (ref 11–51)

## 2015-06-18 LAB — CBC
HCT: 29.8 % — ABNORMAL LOW (ref 36.0–46.0)
HEMOGLOBIN: 8.9 g/dL — AB (ref 12.0–15.0)
MCH: 25.6 pg — ABNORMAL LOW (ref 26.0–34.0)
MCHC: 29.9 g/dL — ABNORMAL LOW (ref 30.0–36.0)
MCV: 85.6 fL (ref 78.0–100.0)
PLATELETS: 211 10*3/uL (ref 150–400)
RBC: 3.48 MIL/uL — AB (ref 3.87–5.11)
RDW: 16.7 % — ABNORMAL HIGH (ref 11.5–15.5)
WBC: 6.6 10*3/uL (ref 4.0–10.5)

## 2015-06-18 MED ORDER — IOHEXOL 300 MG/ML  SOLN
100.0000 mL | Freq: Once | INTRAMUSCULAR | Status: AC | PRN
  Administered 2015-06-18: 100 mL via INTRAVENOUS

## 2015-06-18 MED ORDER — FENTANYL 25 MCG/HR TD PT72: 25.0000 ug | MEDICATED_PATCH | TRANSDERMAL | Status: DC

## 2015-06-18 MED ORDER — HYDROMORPHONE HCL 1 MG/ML IJ SOLN
0.5000 mg | Freq: Once | INTRAMUSCULAR | Status: AC
  Administered 2015-06-18: 0.5 mg via INTRAVENOUS
  Filled 2015-06-18: qty 1

## 2015-06-18 NOTE — Discharge Instructions (Signed)
#  1 - please apply the patch tonight - if you develop nausea or abnormal or concerning symptoms, take the patch off immediately, clean the skin well with soap and water and then come to the ER.  See your doctor in next 2 days for recheck and to discuss your results.  Please obtain all of your results from medical records or have your doctors office obtain the results - share them with your doctor - you should be seen at your doctors office in the next 2 days. Call today to arrange your follow up. Take the medications as prescribed. Please review all of the medicines and only take them if you do not have an allergy to them. Please be aware that if you are taking birth control pills, taking other prescriptions, ESPECIALLY ANTIBIOTICS may make the birth control ineffective - if this is the case, either do not engage in sexual activity or use alternative methods of birth control such as condoms until you have finished the medicine and your family doctor says it is OK to restart them. If you are on a blood thinner such as COUMADIN, be aware that any other medicine that you take may cause the coumadin to either work too much, or not enough - you should have your coumadin level rechecked in next 7 days if this is the case.  ?  It is also a possibility that you have an allergic reaction to any of the medicines that you have been prescribed - Everybody reacts differently to medications and while MOST people have no trouble with most medicines, you may have a reaction such as nausea, vomiting, rash, swelling, shortness of breath. If this is the case, please stop taking the medicine immediately and contact your physician.  ?  You should return to the ER if you develop severe or worsening symptoms.

## 2015-06-18 NOTE — ED Notes (Signed)
Pt c/o RT sided abdominal pain x 2 days. Pt had a Y90 treatment on March 2nd at [facility]. States that cancer MD sent her here. Denies n/v/d. Pt currently being treated for colon cancer that has spread to her liver.

## 2015-06-18 NOTE — ED Provider Notes (Signed)
CSN: [id number]     Arrival date & time 06/18/15  1102 History   First MD Initiated Contact with Patient 06/18/15 1126     Chief Complaint  Patient presents with  . 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 She has had a prior colon resection and has a diverting colostomy. She has known liver metastatic disease, she has undergone chemotherapy and has had radiation most recently approximately 3 weeks ago. She states that over the last several days she has had increasing amounts of right upper quadrant tenderness, it is constant throughout the day, gradually worsening but is not associated with diarrhea, blood in the stools, abdominal distention or nausea or vomiting. She was able to eat wall. This morning. She was referred to the emergency Department both by the radiologist who she spoke with as well as the oncology clinic who she called this morning. At this time her pain is moderate  Patient is a 60 y.o. female presenting with abdominal pain. The history is provided by the patient.  Abdominal Pain   Past Medical History  Diagnosis Date  . Crohn's disease ([place])   . Chronic headache   . Cancer ([place])   . Nausea and vomiting 08/25/2014  . Sinus infection   . PONV (postoperative nausea and vomiting)     with all sedation medications   Past Surgical History  Procedure Laterality Date  . Breast surgery    . Uterine fibroid surgery    . Colonoscopy w/ biopsies  06/29/2014    DR HUNG  . Flexible sigmoidoscopy N/A 06/30/2014    Procedure: FLEXIBLE SIGMOIDOSCOPY;  Surgeon: Carol Ada, MD;  Location: [facility] ENDOSCOPY;  Service: Endoscopy;  Laterality: N/A;  . Colonic stent placement N/A 06/30/2014    Procedure: COLONIC STENT PLACEMENT;  Surgeon: Carol Ada, MD;  Location: [facility] ENDOSCOPY;  Service:  Endoscopy;  Laterality: N/A;  with fluro   . Portacath placement N/A 07/04/2014    Procedure: POWER PORT PLACEMENT;  Surgeon: Alphonsa Overall, MD;  Location: [facility];  Service: General;  Laterality: N/A;  . Laparotomy N/A 08/31/2014    Procedure: EXPLORATORY LAPAROTOMY;  Surgeon: Michael Boston, MD;  Location: [facility] ORS;  Service: General;  Laterality: N/A;  . Bowel resection N/A 08/31/2014    Procedure: SMALL BOWEL RESECTION;  Surgeon: Michael Boston, MD;  Location: [facility];  Service: General;  Laterality: N/A;  . Colostomy N/A 08/31/2014    Procedure: low anterior resection with COLOSTOMY;  Surgeon: Michael Boston, MD;  Location: [facility];  Service: General;  Laterality: N/A;  . Appendectomy  08/31/2014    Procedure: APPENDECTOMY;  Surgeon: Michael Boston, MD;  Location: [facility];  Service: General;;  . Supracervical abdominal hysterectomy  08/31/2014    Procedure: HYSTERECTOMY SUPRACERVICAL ABDOMINAL BILATERAL TUBES AND OVARIES;  Surgeon: Michael Boston, MD;  Location: [facility];  Service: General;;  . Transverse colon resection  08/31/2014    Procedure: TRANSVERSE COLON RESECTION;  Surgeon: Michael Boston, MD;  Location: [facility];  Service: General;;  . Debulking  08/31/2014    Procedure: DEBULKING OF PERITONEUM;  Surgeon: Michael Boston, MD;  Location: [facility];  Service: General;;   Family History  Problem Relation Age of Onset  . Hypertension Mother   . Cancer Father    Social History  Substance Use Topics  . Smoking status: Former Smoker -- 0.50 packs/day for  40 years    Types: Cigarettes    Quit date: 06/25/2014  . Smokeless tobacco: Never Used  . Alcohol Use: No   OB History    Gravida Para Term Preterm AB TAB SAB Ectopic Multiple Living   3 2 2  1  1         Review of Systems  Gastrointestinal: Positive for abdominal pain.  All other systems reviewed and are negative.     Allergies  Avelox; Codeine; Dextromethorphan; Erythromycin; Penicillins; Percocet; Reglan; Zofran; Clindamycin hcl; Red dye; and  Suprep  Home Medications   Prior to Admission medications   Medication Sig Start Date End Date Taking? Authorizing Provider  acetaminophen (TYLENOL) 325 MG tablet Take 325 mg by mouth every 6 (six) hours as needed for fever or headache (headahce).    Yes Historical Provider, MD  ALPRAZolam Duanne Moron) 0.5 MG tablet Take 0.125-0.5 mg by mouth at bedtime as needed for sleep.  09/17/14  Yes Historical Provider, MD  aprepitant (EMEND) 40 MG capsule Take 40 mg by mouth daily.   Yes Historical Provider, MD  benzocaine (ZILACTIN-B) 10 % mucosal gel Use as directed 1 application in the mouth or throat as needed for mouth pain. Reported on 03/27/2015   Yes Historical Provider, MD  cefPROZIL (CEFZIL) 250 MG tablet Take 1 tablet by mouth 4 (four) times daily. 06/15/15  Yes Historical Provider, MD  cromolyn (NASALCROM) 5.2 MG/ACT nasal spray Place 1 spray into both nostrils at bedtime.   Yes Historical Provider, MD  Cyanocobalamin (VITAMIN B-12 CR) 1500 MCG TBCR Take 1 tablet by mouth daily.   Yes Historical Provider, MD  dexamethasone (DECADRON) 4 MG tablet Take 1 tablet (4 mg total) by mouth 2 (two) times daily. For 2 days. Begin day of pump disconnect. 02/14/15  Yes Manon Hilding Kefalas, PA-C  docusate calcium (SURFAK) 240 MG capsule Take 240 mg by mouth daily as needed for mild constipation.   Yes Historical Provider, MD  ENSURE (ENSURE) Take 237 mLs by mouth 2 (two) times daily with a meal.   Yes Historical Provider, MD  Fluorouracil (ADRUCIL IV) Inject into the vein every 14 (fourteen) days.   Yes Historical Provider, MD  GENERLAC 10 GM/15ML SOLN Take 15 mLs (10 g total) by mouth daily as needed (for constipation). 03/29/15  Yes Baird Cancer, PA-C  irinotecan in dextrose 5 % 500 mL Inject into the vein once.   Yes Historical Provider, MD  LEUCOVORIN CALCIUM IV Inject into the vein. Reported on 03/27/2015   Yes Historical Provider, MD  lidocaine-prilocaine (EMLA) cream Apply 1 application topically as needed.  Apply to portacath site 1-2 hours prior to use 07/14/14  Yes Owens Shark, NP  loperamide (IMODIUM A-D) 2 MG tablet Take 2 mg by mouth 4 (four) times daily as needed for diarrhea or loose stools. Reported on 05/08/2015   Yes Historical Provider, MD  LORazepam (ATIVAN) 0.5 MG tablet Take 1 tablet (0.5 mg total) by mouth every 6 (six) hours as needed (nausea/vomiting). May take SL if concern for vomiting exists 03/23/15  Yes Baird Cancer, PA-C  Multiple Vitamins-Minerals (MULTIVITAMIN WITH MINERALS) tablet Take 1 tablet by mouth daily.   Yes Historical Provider, MD  nicotine (NICODERM CQ - DOSED IN MG/24 HR) 7 mg/24hr patch Place 7 mg onto the skin daily.   Yes Historical Provider, MD  fentaNYL (DURAGESIC) 25 MCG/HR patch Place 1 patch (25 mcg total) onto the skin every 3 (three) days. 06/18/15   Noemi Chapel, MD  BP 136/66 mmHg  Pulse 78  Temp(Src) 98.9 F (37.2 C) (Oral)  Resp 12  Ht 5\' 7"  (1.702 m)  Wt 107 lb (48.535 kg)  BMI 16.75 kg/m2  SpO2 100% Physical Exam  Constitutional: She appears well-developed and well-nourished. No distress.  HENT:  Head: Normocephalic and atraumatic.  Mouth/Throat: Oropharynx is clear and moist. No oropharyngeal exudate.  Eyes: Conjunctivae and EOM are normal. Pupils are equal, round, and reactive to light. Right eye exhibits no discharge. Left eye exhibits no discharge. No scleral icterus.  Neck: Normal range of motion. Neck supple. No JVD present. No thyromegaly present.  Cardiovascular: Normal rate, regular rhythm, normal heart sounds and intact distal pulses.  Exam reveals no gallop and no friction rub.   No murmur heard. Pulmonary/Chest: Effort normal and breath sounds normal. No respiratory distress. She has no wheezes. She has no rales.  Abdominal: Soft. Bowel sounds are normal. She exhibits no distension and no mass. There is tenderness ( Tenderness over the abdominal wall including the right upper quadrant. No other abdominal tenderness).  Ostomy  bag is empty, no distention, no tympanitic sounds to percussion  Musculoskeletal: Normal range of motion. She exhibits no edema or tenderness.  Lymphadenopathy:    She has no cervical adenopathy.  Neurological: She is alert. Coordination normal.  Skin: Skin is warm and dry. No rash noted. No erythema.  Psychiatric: She has a normal mood and affect. Her behavior is normal.  Nursing note and vitals reviewed.   ED Course  Procedures (including critical care time) Labs Review Labs Reviewed  COMPREHENSIVE METABOLIC PANEL - Abnormal; Notable for the following:    Glucose, Bld 136 (*)    BUN 22 (*)    Albumin 3.2 (*)    AST 43 (*)    Alkaline Phosphatase 194 (*)    Total Bilirubin 0.2 (*)    All other components within normal limits  CBC - Abnormal; Notable for the following:    RBC 3.48 (*)    Hemoglobin 8.9 (*)    HCT 29.8 (*)    MCH 25.6 (*)    MCHC 29.9 (*)    RDW 16.7 (*)    All other components within normal limits  LIPASE, BLOOD  URINALYSIS, ROUTINE W REFLEX MICROSCOPIC (NOT AT [facility])  I-STAT CG4 LACTIC ACID, ED  I-STAT CG4 LACTIC ACID, ED    Imaging Review Ct Abdomen Pelvis W Contrast  06/18/2015  CLINICAL DATA:  Progressive right upper quadrant abdominal pain since Y-90 therapy 05/31/2015. Metastatic colon cancer. EXAM: CT ABDOMEN AND PELVIS WITH CONTRAST TECHNIQUE: Multidetector CT imaging of the abdomen and pelvis was performed using the standard protocol following bolus administration of intravenous contrast. CONTRAST:  135mL OMNIPAQUE IOHEXOL 300 MG/ML  SOLN COMPARISON:  CT 04/06/2015. FINDINGS: Lower chest: Mildly increased subsegmental atelectasis at both lung bases. No significant pleural or pericardial effusion. Hepatobiliary: Again demonstrated is multifocal hepatic metastatic disease, demonstrating peripheral heterogeneous enhancement. The dominant lesion within the right lobe measures approximately 8.5 x 5.3 cm on image 20 (previously 8.1 x 5.2 cm). The lesion  involving segments 2 and 3 measures approximately 3.9 x 2.2 cm on image 26 (previously 3.8 x 2.3 cm). The lesion in segment 5 measures 3.2 x 2.3 cm on image 32 (previously 2.8 x 2.2 cm). The subcapsular fluid collection anterior to the right lobe on the most recent study has decreased in size. No evidence of acute hemorrhage. No evidence of gallstones, gallbladder wall thickening or biliary dilatation. Pancreas: Unremarkable. No  pancreatic ductal dilatation or surrounding inflammatory changes. Spleen: Normal in size without focal abnormality. Adrenals/Urinary Tract: Both adrenal glands appear normal. The kidneys appear normal without evidence of urinary tract calculus, suspicious lesion or hydronephrosis. No bladder abnormalities are seen. Stomach/Bowel: No evidence of bowel wall thickening, distention or surrounding inflammatory change. Stable postsurgical changes status post low anterior resection and sigmoid colostomy. There are postsurgical changes in the proximal transverse colon status post appendectomy. Vascular/Lymphatic: There are no enlarged abdominal or pelvic lymph nodes. Stable aortic and branch vessel atherosclerosis. No evidence of large vessel occlusion. Reproductive: No evidence of ascites or peritoneal nodularity. The anterior abdominal wall has a stable appearance. Other: Hysterectomy.  No evidence of adnexal mass. Musculoskeletal: No acute or significant osseous findings. IMPRESSION: 1. No acute findings or explanation for the patient's symptoms. No evidence of complication from previous Y 90 therapy. 2. The multifocal hepatic metastatic disease has not substantially changed from the prior study of 10 weeks ago. 3. Mildly increased bibasilar atelectasis. 4. Stable postsurgical changes within the abdomen and moderate atherosclerosis. Electronically Signed   By: Richardean Sale M.D.   On: 06/18/2015 15:11   I have personally reviewed and evaluated these images and lab results as part of my  medical decision-making.    MDM   Final diagnoses:  Right upper quadrant pain    The patient has abdominal pathology, this could be related to cholecystitis as she does still have a, P she no longer has an appendix, she is known to have a bulky lesion in her liver for which she underwent radiation therapy recently. We'll obtain a CT scan to further evaluate the source of the patient's pain.  CT unremarkable and uncahnged from prior - known mets to the liver. Hgb low but up from baseline.  Pt informed - labs otherwise reassuring.  Try fentanyl patch for chronic cancer pain - d/w family and pt - they are in agreement.  Appears stable for d/c.  No vomiting or abnormal VS here.  Filed Vitals:   06/18/15 1121 06/18/15 1310  BP: 134/72 136/66  Pulse: 102 78  Temp: 98.9 F (37.2 C)   TempSrc: Oral   Resp: 18 12  Height: 5\' 7"  (1.702 m)   Weight: 107 lb (48.535 kg)   SpO2: 100% 100%    Meds given in ED:  Medications  HYDROmorphone (DILAUDID) injection 0.5 mg (0.5 mg Intravenous Given 06/18/15 1157)  iohexol (OMNIPAQUE) 300 MG/ML solution 100 mL (100 mLs Intravenous Contrast Given 06/18/15 1431)    New Prescriptions   FENTANYL (DURAGESIC) 25 MCG/HR PATCH    Place 1 patch (25 mcg total) onto the skin every 3 (three) days.      Noemi Chapel, MD 06/18/15 1540

## 2015-06-18 NOTE — ED Notes (Signed)
Patient informed a urine sample is needed at this time. 

## 2015-06-19 ENCOUNTER — Inpatient Hospital Stay (HOSPITAL_COMMUNITY): Payer: Commercial Managed Care - HMO

## 2015-06-20 ENCOUNTER — Ambulatory Visit (HOSPITAL_COMMUNITY): Payer: Commercial Managed Care - HMO | Admitting: Oncology

## 2015-06-24 ENCOUNTER — Encounter (HOSPITAL_COMMUNITY): Payer: Self-pay | Admitting: Oncology

## 2015-06-24 DIAGNOSIS — Z9119 Patient's noncompliance with other medical treatment and regimen: Secondary | ICD-10-CM | POA: Insufficient documentation

## 2015-06-24 DIAGNOSIS — Z91199 Patient's noncompliance with other medical treatment and regimen due to unspecified reason: Secondary | ICD-10-CM | POA: Insufficient documentation

## 2015-06-24 HISTORY — DX: Patient's noncompliance with other medical treatment and regimen: Z91.19

## 2015-06-24 HISTORY — DX: Patient's noncompliance with other medical treatment and regimen due to unspecified reason: Z91.199

## 2015-06-24 NOTE — Assessment & Plan Note (Signed)
Medical noncompliance with refusal to follow supportive therapy interventions/recommendations, refusal for Avastin therapy, and refusal for treatment according to recommend schedule.

## 2015-06-24 NOTE — Assessment & Plan Note (Addendum)
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 Michael Boston on 08/31/2014 when she was admitted with SBO. HER2 NEGATIVE and preserved expression of the major and minor MMR proteins, thus there is a very low probability that microsatellite instability (MSI) is present.  Oncology history is updated.  Problem list is updated with deletion, resolution, and addition of problems.  Pre-treatment labs as ordered: CBC diff, CMET.  Labs today a show a leukocytosis and progressive anemia.  We'll defer treatment 7 days. I will add a peripheral anemia workup which can be done next week. X  She is refusing chemotherapy today and therefore her treatment plan is adjusted accordingly.  Chest x-ray today. Orders place. Urinalysis today.  We will discontinue her Cefzil antibiotic. I prescribed doxycycline 100 mg twice a day for 7 days.  Return in 7 days for pretreatment labs: CBC diff, CMET, anemia panel, retic count.  Return in 1 week for pre-treatment labs and treatment.  Return in 3 weeks for follow-up, pre-treatment labs, and follow-up appointment.

## 2015-06-24 NOTE — Progress Notes (Signed)
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 Michael Boston on 08/31/2014 when she was admitted with SBO.  HER2 NEGATIVE and preserved expression of the major and minor MMR proteins, thus there is a very low probability that microsatellite instability (MSI) is present.     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 Diffuse liver metastases   07/02/2014 Imaging Changes consistent with the known history of colon carcinoma with metastatic disease involving the liver and peritoneum. Recent stent placement within the colonic lesion. Some fecal material is noted within the proximal portion of the stent which a...   07/05/2014 Pathology Results Liver, needle/core biopsy, left lobe - METASTATIC ADENOCARCINOMA   07/05/2014 Pathology Results There is preserved expression of the major and minor MMR proteins. There is a very low probability that microsatellite instability (MSI)  is present.   07/05/2014 Pathology Results HER 2 NEGATIVE   07/19/2014 - 08/16/2014 Chemotherapy FOLFOX   07/23/2014 Imaging Similar findings of advanced stage IV metastatic colon cancer with perhaps slight interval progression of disease in the liver.   08/25/2014 Treatment Plan Change Chemotherapy held due to hospitalization   08/25/2014 - 09/21/2014 Hospital Admission SBO   08/26/2014 Imaging High-grade distal small bowel obstruction, likely from peritoneal metastatic disease.  Extensive hepatic metastatic disease. The largest metastasis in the left lobe has decreased from 07/23/2014.   08/31/2014 Surgery Colon, segmental resection for tumor, rectum - INVASIVE ADENOCARCINOMA, MODERATELY DIFFERENTIATED - TUMOR INVADES THROUGH SEROSA, SEE COMMENT. - PERFORATION WITH VISIBLE STENT. - LYMPHOVASCULAR AND PERINEURAL INVASION IS PRESENT. - DISTAL AND PROX...   09/15/2014 Imaging No bowel obstruction suspected status post multifocal small and large bowel resection with both primary reanastomoses and descending colostomy. Patulous appearance of colon in the right abdomen might reflect focal ileus.   11/06/2014 - 01/17/2015 Chemotherapy FOLFOX   11/28/2014 - 11/30/2014 Hospital Admission 1.   Nausea and vomiting 2.   Constipation    01/18/2015 - 01/22/2015 Hospital Admission Nausea and vomiting, UTI (lower urinary tract infection)   01/29/2015 Progression CT CAP- Progressive hepatic metastatic disease. New and enlarging heterogeneous nodules and masses in the liver measure up to 5.2 x 6.7 cm (image 56), previously 3.3 x 5.1 cm on 09/15/2014.   02/12/2015 -  Chemotherapy FOLFIRI, patient refuses Avastin.  5FU bolus deleted without administration beginning on cycle 1.   04/06/2015 Imaging CT C/A/P mixed response with decreased size of several hepatic lesions, increased size of others, dominant lesion in RL of liver 8.1x5.2x8.4cm, large serosal implant overlying RL of liver appears increased   05/31/2015 Procedure Arteriography  and Y-90 embolization performed of replaced R  hepatic artery off SMA. 26 mCI prescribed dose of SIR spheres    I personally reviewed and went over laboratory results with the patient.  The results are noted within this dictation.  Labs will be updated today. Labs today show a progressive anemia with a leukocytosis. Officially, her labs need treatment parameters, but clinically, the patient refuses therapy given some symptoms.  Patient notes that she is experiencing significant right upper quadrant discomfort. She notes that it is a little better of late. It's exacerbated by deep inspiration. It was so bad that last week  she reported to the emergency department. She was subsequently prescribed fentanyl after her CT of abdomen and pelvis did not show any acute findings. She was prescribed 25 g of fentanyl. She admits that she has not started this medication and refuses it moving forward given family history of addictions. She is given education regarding the role of fentanyl and it being a long-acting pain medication. Unfortunately, she refuses to utilize his medication.  She is currently on Cefzil antibiotic that was prescribed approximate 7-10 days ago by her primary care physician, Dr. Nevada Crane. It was prescribed for upper respiratory tract infection. She notes that she is no better since taking this medication. As a result, we will discontinue this antibiotic in change to another antibiotic. She is agreeable to a chest x-ray today. Of note, she had a negative urinalysis in the emergency Department last week. We'll repeat this as well today.  She notes intermittent fevers over the past week or so. Her temperature today in the clinic is 99.1F.  She refuses chemotherapy today and given her leukocytosis and other signs and symptoms, it would be reasonable to defer her 1 week. However, she is long overdue for the reinitiation of therapy after why 90 treatment. I discussed her case with pharmacy regard to  antibiotic therapy recommendations given her long list of "allergies."   Past Medical History  Diagnosis Date  . Crohn's disease ([place])   . Chronic headache   . Cancer ([place])   . Nausea and vomiting 08/25/2014  . Sinus infection   . PONV (postoperative nausea and vomiting)     with all sedation medications  . 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     is allergic to avelox; codeine; dextromethorphan; erythromycin; penicillins; percocet; reglan; zofran; clindamycin hcl; red dye; and suprep.  Current Outpatient Prescriptions on File Prior to Visit  Medication Sig Dispense Refill  . acetaminophen (TYLENOL) 325 MG tablet Take 325 mg by mouth every 6 (six) hours as needed for fever or headache (headahce).     . ALPRAZolam (XANAX) 0.5 MG tablet Take 0.125-0.5 mg by mouth at bedtime as needed for sleep.     Marland Kitchen aprepitant (EMEND) 40 MG capsule Take 40 mg by mouth daily.    . benzocaine (ZILACTIN-B) 10 % mucosal gel Use as directed 1 application in the mouth or throat as needed for mouth pain. Reported on 03/27/2015    . cromolyn (NASALCROM) 5.2 MG/ACT nasal spray Place 1 spray into both nostrils at bedtime.    . Cyanocobalamin (VITAMIN B-12 CR) 1500 MCG TBCR Take 1 tablet by mouth daily.    Marland Kitchen dexamethasone (DECADRON) 4  MG tablet Take 1 tablet (4 mg total) by mouth 2 (two) times daily. For 2 days. Begin day of pump disconnect. 8 tablet 2  . docusate calcium (SURFAK) 240 MG capsule Take 240 mg by mouth daily as needed for mild constipation.    . ENSURE (ENSURE) Take 237  mLs by mouth 2 (two) times daily with a meal.    . fentaNYL (DURAGESIC) 25 MCG/HR patch Place 1 patch (25 mcg total) onto the skin every 3 (three) days. 5 patch 0  . Fluorouracil (ADRUCIL IV) Inject into the vein every 14 (fourteen) days.    . GENERLAC 10 GM/15ML SOLN Take 15 mLs (10 g total) by mouth daily as needed (for constipation). 2700 mL 2  . irinotecan in dextrose 5 % 500 mL Inject into the vein once.    Marland Kitchen LEUCOVORIN CALCIUM IV Inject into the vein. Reported on 03/27/2015    . lidocaine-prilocaine (EMLA) cream Apply 1 application topically as needed. Apply to portacath site 1-2 hours prior to use 30 g 3  . loperamide (IMODIUM A-D) 2 MG tablet Take 2 mg by mouth 4 (four) times daily as needed for diarrhea or loose stools. Reported on 05/08/2015    . LORazepam (ATIVAN) 0.5 MG tablet Take 1 tablet (0.5 mg total) by mouth every 6 (six) hours as needed (nausea/vomiting). May take SL if concern for vomiting exists 45 tablet 2  . Multiple Vitamins-Minerals (MULTIVITAMIN WITH MINERALS) tablet Take 1 tablet by mouth daily.    . nicotine (NICODERM CQ - DOSED IN MG/24 HR) 7 mg/24hr patch Place 7 mg onto the skin daily.     Current Facility-Administered Medications on File Prior to Visit  Medication Dose Route Frequency Provider Last Rate Last Dose  . atropine injection 0.5 mg  0.5 mg Intravenous Once PRN Patrici Ranks, MD   0.5 mg at 02/12/15 1001  . heparin lock flush 100 unit/mL  500 Units Intracatheter Once PRN Patrici Ranks, MD      . promethazine (PHENERGAN) injection 12.5 mg  12.5 mg Intravenous Q4H PRN Patrici Ranks, MD   12.5 mg at 02/12/15 1000  . sodium chloride 0.9 % injection 10 mL  10 mL Intracatheter PRN Patrici Ranks, MD        Past Surgical History  Procedure Laterality Date  . Breast surgery    . Uterine fibroid surgery    . Colonoscopy w/ biopsies  06/29/2014    DR HUNG  . Flexible sigmoidoscopy N/A 06/30/2014    Procedure: FLEXIBLE SIGMOIDOSCOPY;  Surgeon:  Carol Ada, MD;  Location: [facility] ENDOSCOPY;  Service: Endoscopy;  Laterality: N/A;  . Colonic stent placement N/A 06/30/2014    Procedure: COLONIC STENT PLACEMENT;  Surgeon: Carol Ada, MD;  Location: [facility] ENDOSCOPY;  Service: Endoscopy;  Laterality: N/A;  with fluro   . Portacath placement N/A 07/04/2014    Procedure: POWER PORT PLACEMENT;  Surgeon: Alphonsa Overall, MD;  Location: [facility];  Service: General;  Laterality: N/A;  . Laparotomy N/A 08/31/2014    Procedure: EXPLORATORY LAPAROTOMY;  Surgeon: Michael Boston, MD;  Location: [facility];  Service: General;  Laterality: N/A;  . Bowel resection N/A 08/31/2014    Procedure: SMALL BOWEL RESECTION;  Surgeon: Michael Boston, MD;  Location: [facility];  Service: General;  Laterality: N/A;  . Colostomy N/A 08/31/2014    Procedure: low anterior resection with COLOSTOMY;  Surgeon: Michael Boston, MD;  Location: [facility];  Service: General;  Laterality: N/A;  . Appendectomy  08/31/2014  Procedure: APPENDECTOMY;  Surgeon: Michael Boston, MD;  Location: [facility];  Service: General;;  . Supracervical abdominal hysterectomy  08/31/2014    Procedure: HYSTERECTOMY SUPRACERVICAL ABDOMINAL BILATERAL TUBES AND OVARIES;  Surgeon: Michael Boston, MD;  Location: [facility];  Service: General;;  . Transverse colon resection  08/31/2014    Procedure: TRANSVERSE COLON RESECTION;  Surgeon: Michael Boston, MD;  Location: [facility];  Service: General;;  . Debulking  08/31/2014    Procedure: DEBULKING OF PERITONEUM;  Surgeon: Michael Boston, MD;  Location: [facility];  Service: General;;    Denies any headaches, dizziness, double vision, fevers, chills, night sweats, nausea, vomiting, diarrhea, constipation, chest pain, heart palpitations, shortness of breath, blood in stool, black tarry stool, urinary pain, urinary burning, urinary frequency, hematuria.   PHYSICAL EXAMINATION  ECOG PERFORMANCE STATUS: 1 - Symptomatic but completely ambulatory  Filed Vitals:   06/25/15 0852  BP: 128/72  Pulse: 103  Temp: 99.8 F  (37.7 C)  Resp: 18    GENERAL:alert, no distress, well nourished, well developed, cachectic, comfortable, cooperative, smiling and accompanied by her husband. SKIN: skin color, texture, turgor are normal, no rashes or significant lesions HEAD: Normocephalic, No masses, lesions, tenderness or abnormalities EYES: normal, EOMI, Conjunctiva are pink and non-injected EARS: External ears normal OROPHARYNX:lips, buccal mucosa, and tongue normal and mucous membranes are moist, posterior pharynx erythema NECK: supple, trachea midline, anterior cervical soft lymphadenopathy and submandibular soft adenopathy LYMPH:  no palpable lymphadenopathy BREAST:not examined LUNGS: clear to auscultation and percussion HEART: regular rate & rhythm, no murmurs, no gallops, S1 normal and S2 normal ABDOMEN:abdomen soft, normal bowel sounds and colostomy BACK: Back symmetric, no curvature. EXTREMITIES:less then 2 second capillary refill, no joint deformities, effusion, or inflammation, no skin discoloration, no cyanosis  NEURO: alert & oriented x 3 with fluent speech, no focal motor/sensory deficits, gait normal   LABORATORY DATA: CBC    Component Value Date/Time   WBC 10.7* 06/25/2015 0909   WBC 5.5 08/16/2014 1037   RBC 3.32* 06/25/2015 0909   RBC 2.71* 09/12/2014 0515   RBC 3.61* 08/16/2014 1037   HGB 8.3* 06/25/2015 0909   HGB 9.6* 08/16/2014 1037   HCT 27.9* 06/25/2015 0909   HCT 30.9* 08/16/2014 1037   PLT 242 06/25/2015 0909   PLT 167 08/16/2014 1037   MCV 84.0 06/25/2015 0909   MCV 85.6 08/16/2014 1037   MCH 25.0* 06/25/2015 0909   MCH 26.6 08/16/2014 1037   MCHC 29.7* 06/25/2015 0909   MCHC 31.1* 08/16/2014 1037   RDW 16.6* 06/25/2015 0909   RDW 16.1* 08/16/2014 1037   LYMPHSABS 1.0 06/25/2015 0909   LYMPHSABS 1.2 08/16/2014 1037   MONOABS 1.5* 06/25/2015 0909   MONOABS 0.9 08/16/2014 1037   EOSABS 0.1 06/25/2015 0909   EOSABS 0.2 08/16/2014 1037   BASOSABS 0.0 06/25/2015 0909    BASOSABS 0.0 08/16/2014 1037      Chemistry      Component Value Date/Time   NA 134* 06/25/2015 0909   NA 140 08/16/2014 1037   K 3.7 06/25/2015 0909   K 4.2 08/16/2014 1037   CL 99* 06/25/2015 0909   CO2 25 06/25/2015 0909   CO2 28 08/16/2014 1037   BUN 20 06/25/2015 0909   BUN 23.1 08/16/2014 1037   CREATININE 0.63 06/25/2015 0909   CREATININE 0.7 08/16/2014 1037      Component Value Date/Time   CALCIUM 8.5* 06/25/2015 0909   CALCIUM 9.5 08/16/2014 1037   ALKPHOS 203* 06/25/2015 0909   ALKPHOS  143 08/16/2014 1037   AST 33 06/25/2015 0909   AST 21 08/16/2014 1037   ALT 30 06/25/2015 0909   ALT 15 08/16/2014 1037   BILITOT 0.5 06/25/2015 0909   BILITOT 0.28 08/16/2014 1037      Lab Results  Component Value Date   CEA 39.7* 06/12/2015      PENDING LABS:   RADIOGRAPHIC STUDIES:  Dg Chest 2 View  06/25/2015  CLINICAL DATA:  60 year old female with colorectal carcinoma metastatic to the liver. Recently status post Y 90 microsphere treatment. Recent symptoms of cough, shortness of breath, sinus infection. Initial encounter. EXAM: CHEST  2 VIEW COMPARISON:  Restaging chest abdomen and pelvis CT 04/06/2015. Chest radiographs 02/18/2015. FINDINGS: Stable left chest subclavian approach porta cath. Mediastinal contours are stable and within normal limits. Stable lung volumes. No pneumothorax, pulmonary edema, pleural effusion or confluent pulmonary opacity. Stable mild increased interstitial markings diffusely. Osteopenia. No acute osseous abnormality identified. IMPRESSION: No acute cardiopulmonary abnormality. Electronically Signed   By: Genevie Ann M.D.   On: 06/25/2015 11:16   Nm Liver Img Spect  05/31/2015  CLINICAL DATA:  Colorectal carcinoma with unresectable liver metastasis. First therapy to the RIGHT hepatic lobe. EXAM: NUCLEAR MEDICINE SPECIAL MED RAD PHYSICS CONS; NUCLEAR MEDICINE RADIO PHARM THERAPY INTRA ARTERIAL; NUCLEAR MEDICINE TREATMENT PROCEDURE; NUCLEAR MEDICINE  LIVER SCAN TECHNIQUE: In conjunction with the interventional radiologist a Y- Microsphere dose was calculated utilizing body surface area formulation. Calculated dose equal 26.6 mCi. Pre therapy MAA liver SPECT scan and CTA were evaluated. Utilizing a microcatheter system, the RIGHT hepatic artery was selected and Y-90 microspheres were delivered in fractionated aliquots. Radiopharmaceutical was delivered by the interventional radiologist and nuclear radiologist. The patient tolerated procedure well. No adverse effects were noted. Bremsstrahlung planar and SPECT imaging of the abdomen following intrahepatic arterial delivery of Y-90 microsphere was performed. RADIOPHARMACEUTICALS:  16.1 millicuries yttrium 90 microspheres COMPARISON:  MAA scan 05/14/2015, CT abdomen 04/06/2015 FINDINGS: Y - 90 microspheres therapy as above. First therapy the right hepatic lobe. Bremsstrahlung planar and SPECT imaging of the abdomen following intrahepatic arterial delivery of Y-58mcrosphere demonstrates radioactivity localized to the RIGHT hepatic lobe. No evidence of extrahepatic activity. IMPRESSION: Successful Y - 90 microsphere delivery for treatment of unresectable liver metastasis. First therapy to the RIGHT lobe. Bremssstrahlung scan demonstrates activity localized to RIGHT hepatic lobe with no extrahepatic activity identified. Above Electronically Signed   By: SSuzy BouchardM.D.   On: 05/31/2015 15:47   Ct Abdomen Pelvis W Contrast  06/18/2015  CLINICAL DATA:  Progressive right upper quadrant abdominal pain since Y-90 therapy 05/31/2015. Metastatic colon cancer. EXAM: CT ABDOMEN AND PELVIS WITH CONTRAST TECHNIQUE: Multidetector CT imaging of the abdomen and pelvis was performed using the standard protocol following bolus administration of intravenous contrast. CONTRAST:  1053mOMNIPAQUE IOHEXOL 300 MG/ML  SOLN COMPARISON:  CT 04/06/2015. FINDINGS: Lower chest: Mildly increased subsegmental atelectasis at both lung  bases. No significant pleural or pericardial effusion. Hepatobiliary: Again demonstrated is multifocal hepatic metastatic disease, demonstrating peripheral heterogeneous enhancement. The dominant lesion within the right lobe measures approximately 8.5 x 5.3 cm on image 20 (previously 8.1 x 5.2 cm). The lesion involving segments 2 and 3 measures approximately 3.9 x 2.2 cm on image 26 (previously 3.8 x 2.3 cm). The lesion in segment 5 measures 3.2 x 2.3 cm on image 32 (previously 2.8 x 2.2 cm). The subcapsular fluid collection anterior to the right lobe on the most recent study has decreased in size. No  evidence of acute hemorrhage. No evidence of gallstones, gallbladder wall thickening or biliary dilatation. Pancreas: Unremarkable. No pancreatic ductal dilatation or surrounding inflammatory changes. Spleen: Normal in size without focal abnormality. Adrenals/Urinary Tract: Both adrenal glands appear normal. The kidneys appear normal without evidence of urinary tract calculus, suspicious lesion or hydronephrosis. No bladder abnormalities are seen. Stomach/Bowel: No evidence of bowel wall thickening, distention or surrounding inflammatory change. Stable postsurgical changes status post low anterior resection and sigmoid colostomy. There are postsurgical changes in the proximal transverse colon status post appendectomy. Vascular/Lymphatic: There are no enlarged abdominal or pelvic lymph nodes. Stable aortic and branch vessel atherosclerosis. No evidence of large vessel occlusion. Reproductive: No evidence of ascites or peritoneal nodularity. The anterior abdominal wall has a stable appearance. Other: Hysterectomy.  No evidence of adnexal mass. Musculoskeletal: No acute or significant osseous findings. IMPRESSION: 1. No acute findings or explanation for the patient's symptoms. No evidence of complication from previous Y 90 therapy. 2. The multifocal hepatic metastatic disease has not substantially changed from the prior  study of 10 weeks ago. 3. Mildly increased bibasilar atelectasis. 4. Stable postsurgical changes within the abdomen and moderate atherosclerosis. Electronically Signed   By: Richardean Sale M.D.   On: 06/18/2015 15:11   Ir Angiogram Visceral Selective  05/31/2015  CLINICAL DATA:  Metastatic rectosigmoid carcinoma to the liver. Yttrium 90 radioembolization is now performed to the right lobe of the liver. 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 EXAM: 1. ULTRASOUND GUIDANCE FOR VASCULAR ACCESS OF THE RIGHT COMMON FEMORAL ARTERY 2. HEPATIC ARTERIOGRAPHY WITH SELECTIVE CATHETERIZATION OF THE SUPERIOR MESENTERIC ARTERY AND ARTERIOGRAPHY AT THE LEVEL OF THE COMMON HEPATIC ARTERY 3. TRANSCATHETER YTTRIUM-90 RADIOEMBOLIZATION OF THE RIGHT HEPATIC ARTERY FLUOROSCOPY TIME:  7 minutes MEDICATIONS AND MEDICAL HISTORY: 6.0 mg IV Versed; 100 mcg IV Fentanyl. Additional Medications: 2 g IV Ancef, 8 mg IV Decadron, 40 mg IV Protonix Y-90 dose: 26 mCi ANESTHESIA/SEDATION: Moderate sedation time: 57 minutes CONTRAST:  Fifty-two ml Omnipaque-300 PROCEDURE: The procedure, risks, benefits, and alternatives were explained to the patient. Questions regarding the procedure were encouraged and answered. The patient understands and consents to the procedure. A time-out was performed prior to initiating the procedure. The right groin was prepped with chlorhexidine in a sterile fashion, and a sterile drape was applied covering the operative field. A sterile gown and sterile gloves were used for the procedure. Local anesthesia was provided with 1% Lidocaine. Ultrasound image documentation was performed. Ultrasound was utilized to confirm patency of the right common femoral artery. Access of the right common femoral artery was performed under ultrasound guidance with a micropuncture set. A 5-French sheath was placed. A 5-French Sos catheter was advanced and used to  selectively catheterize the superior mesenteric artery. A coaxial microcatheter was then advanced into the right hepatic artery and selective arteriography performed. The microcatheter was further advanced into the right hepatic artery. Radioembolization was performed with Yttrium-90 SIR Spheres. Particles were administered via a microcatheter utilizing a completely enclosed system. Monitoring of antegrade flow was performed during administration under fluoroscopy with use of contrast intermittently. After administration of the first dose of particles, the microcatheter was removed and discarded along with the attached tubing and particle vial. Arteriotomy closure was performed with the Cordis Exoseal device. FINDINGS: Arteriography shows stable and normal patency of a replaced right hepatic artery off of the proximal superior mesenteric artery. Abnormal tumor blood supply is visualized to metastatic lesions in the right lobe.  There is a focal area of hypervascularity in the inferior right lobe corresponding to a hemangioma which is visible by prior CT. Tiny focus of hypervascularity near the dome of the liver also likely represents a small hemangioma. During administration of Y-90 microspheres, antegrade flow of contrast through the micro catheter was documented. Good antegrade flow was maintained following administration of the SIR Spheres. Arteriotomy closure was initially successful in establishing hemostasis. The patient will recover for 4 hours after the procedure. COMPLICATIONS: None IMPRESSION: Hepatic arterial radioembolization performed with Yttrium-90 microspheres. A 26 millicurie dose was administered into a replaced right hepatic artery off of the superior mesenteric artery. Initial clinical follow-up will be performed in 4 weeks. Electronically Signed   By: Aletta Edouard M.D.   On: 05/31/2015 17:31   Nm Special Med Rad Physics Cons  05/31/2015  CLINICAL DATA:  Colorectal carcinoma with unresectable  liver metastasis. First therapy to the RIGHT hepatic lobe. EXAM: NUCLEAR MEDICINE SPECIAL MED RAD PHYSICS CONS; NUCLEAR MEDICINE RADIO PHARM THERAPY INTRA ARTERIAL; NUCLEAR MEDICINE TREATMENT PROCEDURE; NUCLEAR MEDICINE LIVER SCAN TECHNIQUE: In conjunction with the interventional radiologist a Y- Microsphere dose was calculated utilizing body surface area formulation. Calculated dose equal 26.6 mCi. Pre therapy MAA liver SPECT scan and CTA were evaluated. Utilizing a microcatheter system, the RIGHT hepatic artery was selected and Y-90 microspheres were delivered in fractionated aliquots. Radiopharmaceutical was delivered by the interventional radiologist and nuclear radiologist. The patient tolerated procedure well. No adverse effects were noted. Bremsstrahlung planar and SPECT imaging of the abdomen following intrahepatic arterial delivery of Y-90 microsphere was performed. RADIOPHARMACEUTICALS:  74.0 millicuries yttrium 90 microspheres COMPARISON:  MAA scan 05/14/2015, CT abdomen 04/06/2015 FINDINGS: Y - 90 microspheres therapy as above. First therapy the right hepatic lobe. Bremsstrahlung planar and SPECT imaging of the abdomen following intrahepatic arterial delivery of Y-68mcrosphere demonstrates radioactivity localized to the RIGHT hepatic lobe. No evidence of extrahepatic activity. IMPRESSION: Successful Y - 90 microsphere delivery for treatment of unresectable liver metastasis. First therapy to the RIGHT lobe. Bremssstrahlung scan demonstrates activity localized to RIGHT hepatic lobe with no extrahepatic activity identified. Above Electronically Signed   By: SSuzy BouchardM.D.   On: 05/31/2015 15:47   Nm Special Treatment Procedure  05/31/2015  CLINICAL DATA:  Colorectal carcinoma with unresectable liver metastasis. First therapy to the RIGHT hepatic lobe. EXAM: NUCLEAR MEDICINE SPECIAL MED RAD PHYSICS CONS; NUCLEAR MEDICINE RADIO PHARM THERAPY INTRA ARTERIAL; NUCLEAR MEDICINE TREATMENT PROCEDURE;  NUCLEAR MEDICINE LIVER SCAN TECHNIQUE: In conjunction with the interventional radiologist a Y- Microsphere dose was calculated utilizing body surface area formulation. Calculated dose equal 26.6 mCi. Pre therapy MAA liver SPECT scan and CTA were evaluated. Utilizing a microcatheter system, the RIGHT hepatic artery was selected and Y-90 microspheres were delivered in fractionated aliquots. Radiopharmaceutical was delivered by the interventional radiologist and nuclear radiologist. The patient tolerated procedure well. No adverse effects were noted. Bremsstrahlung planar and SPECT imaging of the abdomen following intrahepatic arterial delivery of Y-90 microsphere was performed. RADIOPHARMACEUTICALS:  281.4millicuries yttrium 90 microspheres COMPARISON:  MAA scan 05/14/2015, CT abdomen 04/06/2015 FINDINGS: Y - 90 microspheres therapy as above. First therapy the right hepatic lobe. Bremsstrahlung planar and SPECT imaging of the abdomen following intrahepatic arterial delivery of Y-93mrosphere demonstrates radioactivity localized to the RIGHT hepatic lobe. No evidence of extrahepatic activity. IMPRESSION: Successful Y - 90 microsphere delivery for treatment of unresectable liver metastasis. First therapy to the RIGHT lobe. Bremssstrahlung scan demonstrates activity localized to RIGHT hepatic lobe with no extrahepatic  activity identified. Above Electronically Signed   By: Suzy Bouchard M.D.   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 Yttrium 90 radioembolization is now performed to the right lobe of the liver. 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 EXAM: 1. ULTRASOUND GUIDANCE FOR VASCULAR ACCESS OF THE RIGHT COMMON FEMORAL ARTERY 2. HEPATIC ARTERIOGRAPHY WITH SELECTIVE CATHETERIZATION OF THE SUPERIOR MESENTERIC ARTERY AND ARTERIOGRAPHY AT THE LEVEL OF  THE COMMON HEPATIC ARTERY 3. TRANSCATHETER YTTRIUM-90 RADIOEMBOLIZATION OF THE RIGHT HEPATIC ARTERY FLUOROSCOPY TIME:  7 minutes MEDICATIONS AND MEDICAL HISTORY: 6.0 mg IV Versed; 100 mcg IV Fentanyl. Additional Medications: 2 g IV Ancef, 8 mg IV Decadron, 40 mg IV Protonix Y-90 dose: 26 mCi ANESTHESIA/SEDATION: Moderate sedation time: 57 minutes CONTRAST:  Fifty-two ml Omnipaque-300 PROCEDURE: The procedure, risks, benefits, and alternatives were explained to the patient. Questions regarding the procedure were encouraged and answered. The patient understands and consents to the procedure. A time-out was performed prior to initiating the procedure. The right groin was prepped with chlorhexidine in a sterile fashion, and a sterile drape was applied covering the operative field. A sterile gown and sterile gloves were used for the procedure. Local anesthesia was provided with 1% Lidocaine. Ultrasound image documentation was performed. Ultrasound was utilized to confirm patency of the right common femoral artery. Access of the right common femoral artery was performed under ultrasound guidance with a micropuncture set. A 5-French sheath was placed. A 5-French Sos catheter was advanced and used to selectively catheterize the superior mesenteric artery. A coaxial microcatheter was then advanced into the right hepatic artery and selective arteriography performed. The microcatheter was further advanced into the right hepatic artery. Radioembolization was performed with Yttrium-90 SIR Spheres. Particles were administered via a microcatheter utilizing a completely enclosed system. Monitoring of antegrade flow was performed during administration under fluoroscopy with use of contrast intermittently. After administration of the first dose of particles, the microcatheter was removed and discarded along with the attached tubing and particle vial. Arteriotomy closure was performed with the Cordis Exoseal device. FINDINGS:  Arteriography shows stable and normal patency of a replaced right hepatic artery off of the proximal superior mesenteric artery. Abnormal tumor blood supply is visualized to metastatic lesions in the right lobe. There is a focal area of hypervascularity in the inferior right lobe corresponding to a hemangioma which is visible by prior CT. Tiny focus of hypervascularity near the dome of the liver also likely represents a small hemangioma. During administration of Y-90 microspheres, antegrade flow of contrast through the micro catheter was documented. Good antegrade flow was maintained following administration of the SIR Spheres. Arteriotomy closure was initially successful in establishing hemostasis. The patient will recover for 4 hours after the procedure. COMPLICATIONS: None IMPRESSION: Hepatic arterial radioembolization performed with Yttrium-90 microspheres. A 26 millicurie dose was administered into a replaced right hepatic artery off of the superior mesenteric artery. Initial clinical follow-up will be performed in 4 weeks. Electronically Signed   By: Aletta Edouard M.D.   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 Yttrium 90 radioembolization is now performed to the right lobe of the liver. 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 EXAM: 1. ULTRASOUND GUIDANCE FOR VASCULAR ACCESS OF THE  RIGHT COMMON FEMORAL ARTERY 2. HEPATIC ARTERIOGRAPHY WITH SELECTIVE CATHETERIZATION OF THE SUPERIOR MESENTERIC ARTERY AND ARTERIOGRAPHY AT THE LEVEL OF THE COMMON HEPATIC ARTERY 3. TRANSCATHETER YTTRIUM-90 RADIOEMBOLIZATION OF THE RIGHT HEPATIC ARTERY FLUOROSCOPY TIME:  7 minutes MEDICATIONS AND MEDICAL HISTORY: 6.0 mg IV Versed; 100 mcg IV Fentanyl. Additional Medications: 2 g IV Ancef, 8 mg IV Decadron, 40 mg IV Protonix Y-90 dose: 26  mCi ANESTHESIA/SEDATION: Moderate sedation time: 57 minutes CONTRAST:  Fifty-two ml Omnipaque-300 PROCEDURE: The procedure, risks, benefits, and alternatives were explained to the patient. Questions regarding the procedure were encouraged and answered. The patient understands and consents to the procedure. A time-out was performed prior to initiating the procedure. The right groin was prepped with chlorhexidine in a sterile fashion, and a sterile drape was applied covering the operative field. A sterile gown and sterile gloves were used for the procedure. Local anesthesia was provided with 1% Lidocaine. Ultrasound image documentation was performed. Ultrasound was utilized to confirm patency of the right common femoral artery. Access of the right common femoral artery was performed under ultrasound guidance with a micropuncture set. A 5-French sheath was placed. A 5-French Sos catheter was advanced and used to selectively catheterize the superior mesenteric artery. A coaxial microcatheter was then advanced into the right hepatic artery and selective arteriography performed. The microcatheter was further advanced into the right hepatic artery. Radioembolization was performed with Yttrium-90 SIR Spheres. Particles were administered via a microcatheter utilizing a completely enclosed system. Monitoring of antegrade flow was performed during administration under fluoroscopy with use of contrast intermittently. After administration of the first dose of particles, the microcatheter was removed and discarded along with the attached tubing and particle vial. Arteriotomy closure was performed with the Cordis Exoseal device. FINDINGS: Arteriography shows stable and normal patency of a replaced right hepatic artery off of the proximal superior mesenteric artery. Abnormal tumor blood supply is visualized to metastatic lesions in the right lobe. There is a focal area of hypervascularity in the inferior right lobe corresponding to  a hemangioma which is visible by prior CT. Tiny focus of hypervascularity near the dome of the liver also likely represents a small hemangioma. During administration of Y-90 microspheres, antegrade flow of contrast through the micro catheter was documented. Good antegrade flow was maintained following administration of the SIR Spheres. Arteriotomy closure was initially successful in establishing hemostasis. The patient will recover for 4 hours after the procedure. COMPLICATIONS: None IMPRESSION: Hepatic arterial radioembolization performed with Yttrium-90 microspheres. A 26 millicurie dose was administered into a replaced right hepatic artery off of the superior mesenteric artery. Initial clinical follow-up will be performed in 4 weeks. Electronically Signed   By: Aletta Edouard M.D.   On: 05/31/2015 17:31   Nm Radio Pharm Therapy Intraarterial  05/31/2015  CLINICAL DATA:  Colorectal carcinoma with unresectable liver metastasis. First therapy to the RIGHT hepatic lobe. EXAM: NUCLEAR MEDICINE SPECIAL MED RAD PHYSICS CONS; NUCLEAR MEDICINE RADIO PHARM THERAPY INTRA ARTERIAL; NUCLEAR MEDICINE TREATMENT PROCEDURE; NUCLEAR MEDICINE LIVER SCAN TECHNIQUE: In conjunction with the interventional radiologist a Y- Microsphere dose was calculated utilizing body surface area formulation. Calculated dose equal 26.6 mCi. Pre therapy MAA liver SPECT scan and CTA were evaluated. Utilizing a microcatheter system, the RIGHT hepatic artery was selected and Y-90 microspheres were delivered in fractionated aliquots. Radiopharmaceutical was delivered by the interventional radiologist and nuclear radiologist. The patient tolerated procedure well. No adverse effects were noted. Bremsstrahlung planar and SPECT imaging of the abdomen following intrahepatic arterial delivery of Y-90  microsphere was performed. RADIOPHARMACEUTICALS:  59.9 millicuries yttrium 90 microspheres COMPARISON:  MAA scan 05/14/2015, CT abdomen 04/06/2015 FINDINGS: Y -  90 microspheres therapy as above. First therapy the right hepatic lobe. Bremsstrahlung planar and SPECT imaging of the abdomen following intrahepatic arterial delivery of Y-64mcrosphere demonstrates radioactivity localized to the RIGHT hepatic lobe. No evidence of extrahepatic activity. IMPRESSION: Successful Y - 90 microsphere delivery for treatment of unresectable liver metastasis. First therapy to the RIGHT lobe. Bremssstrahlung scan demonstrates activity localized to RIGHT hepatic lobe with no extrahepatic activity identified. Above Electronically Signed   By: SSuzy BouchardM.D.   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 SMichael Bostonon 08/31/2014 when she was admitted with SBO. HER2 NEGATIVE and preserved expression of the major and minor MMR proteins, thus there is a very low probability that microsatellite instability (MSI) is present.  Oncology history is updated.  Problem list is updated with deletion, resolution, and addition of problems.  Pre-treatment labs as ordered: CBC diff, CMET.  Labs today a show a leukocytosis and progressive anemia.  We'll defer treatment 7 days. I will add a peripheral anemia workup which can be done next week. X  She is refusing chemotherapy today and therefore her treatment plan is adjusted accordingly.  Chest x-ray today. Orders place. Urinalysis today.  We will discontinue her Cefzil antibiotic. I prescribed doxycycline 100 mg twice a day for 7 days.  Return in 7 days for pretreatment labs: CBC diff, CMET, anemia panel, retic count.  Return in 1 week for pre-treatment labs and treatment.  Return in 3 weeks for follow-up, pre-treatment labs, and  follow-up appointment.  Noncompliance Medical noncompliance with refusal to follow supportive therapy interventions/recommendations, refusal for Avastin therapy, and refusal for treatment according to recommend schedule.    THERAPY PLAN:  Continue with treatment as planned.  Deferred x 7 days.  All questions were answered. The patient knows to call the clinic with any problems, questions or concerns. We can certainly see the patient much sooner if necessary.  Patient and plan discussed with Dr. SAncil Linseyand she is in agreement with the aforementioned.   This note is electronically signed by: KDoy Mince3/27/2017 11:28 AM

## 2015-06-25 ENCOUNTER — Encounter (HOSPITAL_BASED_OUTPATIENT_CLINIC_OR_DEPARTMENT_OTHER): Payer: Commercial Managed Care - HMO | Admitting: Oncology

## 2015-06-25 ENCOUNTER — Encounter (HOSPITAL_COMMUNITY): Payer: Self-pay | Admitting: Oncology

## 2015-06-25 ENCOUNTER — Ambulatory Visit (HOSPITAL_COMMUNITY)
Admission: RE | Admit: 2015-06-25 | Discharge: 2015-06-25 | Disposition: A | Discharge status: Home / Self Care | Payer: Commercial Managed Care - HMO | Source: Ambulatory Visit | Attending: Oncology | Admitting: Oncology

## 2015-06-25 ENCOUNTER — Encounter (HOSPITAL_BASED_OUTPATIENT_CLINIC_OR_DEPARTMENT_OTHER): Payer: Commercial Managed Care - HMO

## 2015-06-25 VITALS — BP 128/72 | HR 103 | Temp 99.8°F | Resp 18 | Wt 108.3 lb

## 2015-06-25 DIAGNOSIS — C189 Malignant neoplasm of colon, unspecified: Secondary | ICD-10-CM | POA: Diagnosis not present

## 2015-06-25 DIAGNOSIS — J069 Acute upper respiratory infection, unspecified: Secondary | ICD-10-CM | POA: Diagnosis not present

## 2015-06-25 DIAGNOSIS — C19 Malignant neoplasm of rectosigmoid junction: Secondary | ICD-10-CM

## 2015-06-25 DIAGNOSIS — Z91199 Patient's noncompliance with other medical treatment and regimen due to unspecified reason: Secondary | ICD-10-CM

## 2015-06-25 DIAGNOSIS — D6481 Anemia due to antineoplastic chemotherapy: Secondary | ICD-10-CM | POA: Diagnosis not present

## 2015-06-25 DIAGNOSIS — C787 Secondary malignant neoplasm of liver and intrahepatic bile duct: Secondary | ICD-10-CM | POA: Diagnosis not present

## 2015-06-25 DIAGNOSIS — R05 Cough: Secondary | ICD-10-CM | POA: Diagnosis not present

## 2015-06-25 DIAGNOSIS — Z9119 Patient's noncompliance with other medical treatment and regimen: Secondary | ICD-10-CM

## 2015-06-25 DIAGNOSIS — T451X5A Adverse effect of antineoplastic and immunosuppressive drugs, initial encounter: Secondary | ICD-10-CM

## 2015-06-25 DIAGNOSIS — R059 Cough, unspecified: Secondary | ICD-10-CM

## 2015-06-25 LAB — URINALYSIS, ROUTINE W REFLEX MICROSCOPIC
Glucose, UA: NEGATIVE mg/dL
Hgb urine dipstick: NEGATIVE
Ketones, ur: NEGATIVE mg/dL
LEUKOCYTES UA: NEGATIVE
Nitrite: NEGATIVE
PROTEIN: 30 mg/dL — AB
Specific Gravity, Urine: 1.03 (ref 1.005–1.030)
pH: 5 (ref 5.0–8.0)

## 2015-06-25 LAB — CBC WITH DIFFERENTIAL/PLATELET
Basophils Absolute: 0 10*3/uL (ref 0.0–0.1)
Basophils Relative: 0 %
EOS PCT: 1 %
Eosinophils Absolute: 0.1 10*3/uL (ref 0.0–0.7)
HEMATOCRIT: 27.9 % — AB (ref 36.0–46.0)
Hemoglobin: 8.3 g/dL — ABNORMAL LOW (ref 12.0–15.0)
Lymphocytes Relative: 9 %
Lymphs Abs: 1 10*3/uL (ref 0.7–4.0)
MCH: 25 pg — ABNORMAL LOW (ref 26.0–34.0)
MCHC: 29.7 g/dL — AB (ref 30.0–36.0)
MCV: 84 fL (ref 78.0–100.0)
MONO ABS: 1.5 10*3/uL — AB (ref 0.1–1.0)
MONOS PCT: 14 %
NEUTROS ABS: 8.1 10*3/uL — AB (ref 1.7–7.7)
Neutrophils Relative %: 76 %
Platelets: 242 10*3/uL (ref 150–400)
RBC: 3.32 MIL/uL — ABNORMAL LOW (ref 3.87–5.11)
RDW: 16.6 % — AB (ref 11.5–15.5)
WBC: 10.7 10*3/uL — ABNORMAL HIGH (ref 4.0–10.5)

## 2015-06-25 LAB — COMPREHENSIVE METABOLIC PANEL
ALBUMIN: 3 g/dL — AB (ref 3.5–5.0)
ALT: 30 U/L (ref 14–54)
ANION GAP: 10 (ref 5–15)
AST: 33 U/L (ref 15–41)
Alkaline Phosphatase: 203 U/L — ABNORMAL HIGH (ref 38–126)
BILIRUBIN TOTAL: 0.5 mg/dL (ref 0.3–1.2)
BUN: 20 mg/dL (ref 6–20)
CO2: 25 mmol/L (ref 22–32)
Calcium: 8.5 mg/dL — ABNORMAL LOW (ref 8.9–10.3)
Chloride: 99 mmol/L — ABNORMAL LOW (ref 101–111)
Creatinine, Ser: 0.63 mg/dL (ref 0.44–1.00)
GFR calc Af Amer: >60 mL/min (ref >60)
Glucose, Bld: 138 mg/dL — ABNORMAL HIGH (ref 65–99)
POTASSIUM: 3.7 mmol/L (ref 3.5–5.1)
Sodium: 134 mmol/L — ABNORMAL LOW (ref 135–145)
TOTAL PROTEIN: 7.5 g/dL (ref 6.5–8.1)

## 2015-06-25 LAB — URINE MICROSCOPIC-ADD ON

## 2015-06-25 MED ORDER — DOXYCYCLINE HYCLATE 100 MG PO TABS: 100.0000 mg | ORAL_TABLET | Freq: Two times a day (BID) | ORAL | Status: DC

## 2015-06-25 NOTE — Patient Instructions (Addendum)
[place] at [facility] Discharge Instructions  RECOMMENDATIONS MADE BY THE CONSULTANT AND ANY TEST RESULTS WILL BE SENT TO YOUR REFERRING PHYSICIAN.  Exam done and seen today by [facility] today.  Chest xray today. Getting a Urinalysis today. Will give you a different antibiotic, Doxycycline. Stop Cefzil Return to see the Doctor in 3 weeks for follow up. Defer treatment for 7 days.  Call the clinic for any concerns or questions.   Thank you for choosing [place] at [facility] to provide your oncology and hematology care.  To afford each patient quality time with our provider, please arrive at least 15 minutes before your scheduled appointment time.   Beginning January 23rd 2017 lab work for the [facility] will be done in the  Main lab at [facility] on 1st floor. If you have a lab appointment with the [facility] please come in thru the  Main Entrance and check in at the main information desk  You need to re-schedule your appointment should you arrive 10 or more minutes late.  We strive to give you quality time with our providers, and arriving late affects you and other patients whose appointments are after yours.  Also, if you no show three or more times for appointments you may be dismissed from the clinic at the providers discretion.     Again, thank you for choosing [facility].  Our hope is that these requests will decrease the amount of time that you wait before being seen by our physicians.       _____________________________________________________________  Should you have questions after your visit to [facility], please contact our office at [phone] between the hours of 8:30 a.m. and 4:30 p.m.  Voicemails left after 4:30 p.m. will not be returned until the following business day.  For prescription refill requests, have your pharmacy contact our office.         Resources For Cancer  Patients and their Caregivers ? American Cancer Society: Can assist with transportation, wigs, general needs, runs Look Good Feel Better.        458-534-3603 ? Cancer Care: Provides financial assistance, online support groups, medication/co-pay assistance.  1-800-813-HOPE 757 336 1269) ? [place] Assists [place] cancer patients and their families through emotional , educational and financial support.  705-487-5493 ? [place] DSS Where to apply for food stamps, Medicaid and utility assistance. (956) 436-2920 ? [employer]: Transportation to medical appointments. (775)717-9881 ? Social Security Administration: May apply for disability if have a Stage IV cancer. 450-785-8627 [phone] [phone] ? [employer], Disability and Transit Services: Assists with nutrition, care and transit needs. [phone]

## 2015-06-25 NOTE — Progress Notes (Signed)
.  Sydney Morrison's reason for visit today are for labs as scheduled per MD orders.  Venipuncture performed with a 23 gauge butterfly needle to [place] tolerated venipuncture well and without incident she does not want to do chemo today and will talk with T. Kefalas PA.C regarding this. See exam encounter for patients assessment and  questions that  were answered and patient was discharged.

## 2015-06-28 ENCOUNTER — Other Ambulatory Visit (HOSPITAL_COMMUNITY)
Admission: RE | Admit: 2015-06-28 | Discharge: 2015-06-28 | Disposition: A | Discharge status: Home / Self Care | Payer: Commercial Managed Care - HMO | Source: Ambulatory Visit | Attending: Hematology & Oncology | Admitting: Hematology & Oncology

## 2015-06-28 DIAGNOSIS — C787 Secondary malignant neoplasm of liver and intrahepatic bile duct: Secondary | ICD-10-CM | POA: Diagnosis present

## 2015-06-28 DIAGNOSIS — C189 Malignant neoplasm of colon, unspecified: Secondary | ICD-10-CM | POA: Diagnosis present

## 2015-07-02 ENCOUNTER — Encounter (HOSPITAL_COMMUNITY): Payer: Commercial Managed Care - HMO | Attending: Hematology & Oncology

## 2015-07-02 VITALS — BP 105/56 | HR 62 | Temp 97.7°F | Resp 16 | Wt 108.0 lb

## 2015-07-02 DIAGNOSIS — C19 Malignant neoplasm of rectosigmoid junction: Secondary | ICD-10-CM

## 2015-07-02 DIAGNOSIS — C787 Secondary malignant neoplasm of liver and intrahepatic bile duct: Secondary | ICD-10-CM | POA: Diagnosis present

## 2015-07-02 DIAGNOSIS — T451X5A Adverse effect of antineoplastic and immunosuppressive drugs, initial encounter: Secondary | ICD-10-CM

## 2015-07-02 DIAGNOSIS — C189 Malignant neoplasm of colon, unspecified: Secondary | ICD-10-CM

## 2015-07-02 DIAGNOSIS — D6481 Anemia due to antineoplastic chemotherapy: Secondary | ICD-10-CM

## 2015-07-02 DIAGNOSIS — Z5111 Encounter for antineoplastic chemotherapy: Secondary | ICD-10-CM | POA: Diagnosis not present

## 2015-07-02 DIAGNOSIS — E876 Hypokalemia: Secondary | ICD-10-CM | POA: Diagnosis present

## 2015-07-02 DIAGNOSIS — R112 Nausea with vomiting, unspecified: Secondary | ICD-10-CM | POA: Diagnosis present

## 2015-07-02 LAB — URINE MICROSCOPIC-ADD ON

## 2015-07-02 LAB — URINALYSIS, ROUTINE W REFLEX MICROSCOPIC
Bilirubin Urine: NEGATIVE
GLUCOSE, UA: NEGATIVE mg/dL
HGB URINE DIPSTICK: NEGATIVE
Ketones, ur: NEGATIVE mg/dL
LEUKOCYTES UA: NEGATIVE
Nitrite: NEGATIVE
PROTEIN: 30 mg/dL — AB
SPECIFIC GRAVITY, URINE: 1.025 (ref 1.005–1.030)
pH: 5 (ref 5.0–8.0)

## 2015-07-02 LAB — COMPREHENSIVE METABOLIC PANEL
ALBUMIN: 2.7 g/dL — AB (ref 3.5–5.0)
ALK PHOS: 185 U/L — AB (ref 38–126)
ALT: 21 U/L (ref 14–54)
ANION GAP: 8 (ref 5–15)
AST: 31 U/L (ref 15–41)
BILIRUBIN TOTAL: 0.3 mg/dL (ref 0.3–1.2)
BUN: 22 mg/dL — ABNORMAL HIGH (ref 6–20)
CALCIUM: 8.4 mg/dL — AB (ref 8.9–10.3)
CO2: 26 mmol/L (ref 22–32)
Chloride: 101 mmol/L (ref 101–111)
Creatinine, Ser: 0.44 mg/dL (ref 0.44–1.00)
GFR calc Af Amer: >60 mL/min (ref >60)
Glucose, Bld: 103 mg/dL — ABNORMAL HIGH (ref 65–99)
POTASSIUM: 3.8 mmol/L (ref 3.5–5.1)
Sodium: 135 mmol/L (ref 135–145)
TOTAL PROTEIN: 6.9 g/dL (ref 6.5–8.1)

## 2015-07-02 LAB — CBC WITH DIFFERENTIAL/PLATELET
BASOS ABS: 0 10*3/uL (ref 0.0–0.1)
Basophils Relative: 0 %
Eosinophils Absolute: 0.1 10*3/uL (ref 0.0–0.7)
Eosinophils Relative: 1 %
HEMATOCRIT: 24.6 % — AB (ref 36.0–46.0)
Hemoglobin: 7.4 g/dL — ABNORMAL LOW (ref 12.0–15.0)
LYMPHS PCT: 12 %
Lymphs Abs: 1.1 10*3/uL (ref 0.7–4.0)
MCH: 24.8 pg — AB (ref 26.0–34.0)
MCHC: 30.1 g/dL (ref 30.0–36.0)
MCV: 82.6 fL (ref 78.0–100.0)
Monocytes Absolute: 1.4 10*3/uL — ABNORMAL HIGH (ref 0.1–1.0)
Monocytes Relative: 16 %
NEUTROS ABS: 6.3 10*3/uL (ref 1.7–7.7)
Neutrophils Relative %: 71 %
Platelets: 262 10*3/uL (ref 150–400)
RBC: 2.98 MIL/uL — AB (ref 3.87–5.11)
RDW: 16.8 % — AB (ref 11.5–15.5)
WBC: 8.9 10*3/uL (ref 4.0–10.5)

## 2015-07-02 LAB — ABO/RH: ABO/RH(D): A POS

## 2015-07-02 LAB — IRON AND TIBC
Iron: 10 ug/dL — ABNORMAL LOW (ref 28–170)
SATURATION RATIOS: 4 % — AB (ref 10.4–31.8)
TIBC: 258 ug/dL (ref 250–450)
UIBC: 248 ug/dL

## 2015-07-02 LAB — PREPARE RBC (CROSSMATCH)

## 2015-07-02 LAB — RETICULOCYTES
RBC.: 2.98 MIL/uL — AB (ref 3.87–5.11)
RETIC CT PCT: 2 % (ref 0.4–3.1)
Retic Count, Absolute: 59.6 10*3/uL (ref 19.0–186.0)

## 2015-07-02 LAB — VITAMIN B12: VITAMIN B 12: 1805 pg/mL — AB (ref 180–914)

## 2015-07-02 LAB — FOLATE: Folate: 29.1 ng/mL (ref >5.9)

## 2015-07-02 LAB — FERRITIN: FERRITIN: 97 ng/mL (ref 11–307)

## 2015-07-02 MED ORDER — SODIUM CHLORIDE 0.9 % IJ SOLN
10.0000 mL | INTRAMUSCULAR | Status: DC | PRN
  Administered 2015-07-02: 10 mL
  Filled 2015-07-02: qty 10

## 2015-07-02 MED ORDER — PALONOSETRON HCL INJECTION 0.25 MG/5ML
0.2500 mg | Freq: Once | INTRAVENOUS | Status: AC
  Administered 2015-07-02: 0.25 mg via INTRAVENOUS
  Filled 2015-07-02: qty 5

## 2015-07-02 MED ORDER — ATROPINE SULFATE 1 MG/ML IJ SOLN
0.5000 mg | Freq: Once | INTRAMUSCULAR | Status: AC | PRN
  Administered 2015-07-02: 0.5 mg via INTRAVENOUS
  Filled 2015-07-02: qty 1

## 2015-07-02 MED ORDER — ACETAMINOPHEN 325 MG PO TABS
650.0000 mg | ORAL_TABLET | Freq: Once | ORAL | Status: AC
  Administered 2015-07-02: 650 mg via ORAL

## 2015-07-02 MED ORDER — SODIUM CHLORIDE 0.9 % IV SOLN
2400.0000 mg/m2 | INTRAVENOUS | Status: DC
  Administered 2015-07-02: 3450 mg via INTRAVENOUS
  Filled 2015-07-02: qty 50

## 2015-07-02 MED ORDER — ACETAMINOPHEN 325 MG PO TABS
ORAL_TABLET | ORAL | Status: AC
  Filled 2015-07-02: qty 2

## 2015-07-02 MED ORDER — SODIUM CHLORIDE 0.9 % IV SOLN
Freq: Once | INTRAVENOUS | Status: AC
  Administered 2015-07-02: 11:00:00 via INTRAVENOUS

## 2015-07-02 MED ORDER — SODIUM CHLORIDE 0.9 % IV SOLN
Freq: Once | INTRAVENOUS | Status: AC
  Administered 2015-07-02: 11:00:00 via INTRAVENOUS
  Filled 2015-07-02: qty 5

## 2015-07-02 MED ORDER — LEUCOVORIN CALCIUM INJECTION 350 MG
400.0000 mg/m2 | Freq: Once | INTRAMUSCULAR | Status: AC
  Administered 2015-07-02: 576 mg via INTRAVENOUS
  Filled 2015-07-02: qty 28.8

## 2015-07-02 MED ORDER — SODIUM CHLORIDE 0.9 % IV SOLN
250.0000 mL | Freq: Once | INTRAVENOUS | Status: AC
  Administered 2015-07-02: 250 mL via INTRAVENOUS

## 2015-07-02 MED ORDER — IRINOTECAN HCL CHEMO INJECTION 100 MG/5ML
180.0000 mg/m2 | Freq: Once | INTRAVENOUS | Status: AC
  Administered 2015-07-02: 260 mg via INTRAVENOUS
  Filled 2015-07-02: qty 4.33

## 2015-07-02 NOTE — Progress Notes (Signed)
Patient reports finished antibiotics yesterday. Reports she still feel like "crap" but her sinuses have cleared up and she no longer has "green" mucus. Reports having fever/chills and "sweats". Reports highest temp at home was 101.0. Reports urine is dark in color and has a strong odor but states she knows it is from the antibiotics. States if her blood counts are good she still wants to take chemo.  Patient agreed to blood transfusion.  Patient refused phenergan pre-med.  Patient reports she can take tylenol prior to blood transfusion and she is not allergic to it and she has taken it recently at home without nausea/vomiting. Patient reports she can not take benadryl because it causes respiratory distress/dificulty breathing.  Tolerated blood transfusion well.  Continuous infusion pump intact. Patient ambulatory on discharge home with husband.

## 2015-07-02 NOTE — Patient Instructions (Signed)
[facility] Discharge Instructions for Patients Receiving Chemotherapy   Beginning January 23rd 2017 lab work for the [facility] will be done in the  Main lab at [facility] on 1st floor. If you have a lab appointment with the [facility] please come in thru the  Main Entrance and check in at the main information desk   Today you received the following chemotherapy agents Irinotecan,Leucovorin and 5FU pump. You also received a blood transfusion one 1 unit packed red blood cells.  To help prevent nausea and vomiting after your treatment, we encourage you to take your nausea medication as instructed.   If you develop nausea and vomiting, or diarrhea that is not controlled by your medication, call the clinic.  The clinic phone number is [phone]. Office hours are Monday-Friday 8:30am-5:00pm.  BELOW ARE SYMPTOMS THAT SHOULD BE REPORTED IMMEDIATELY:  *FEVER GREATER THAN 101.0 F  *CHILLS WITH OR WITHOUT FEVER  NAUSEA AND VOMITING THAT IS NOT CONTROLLED WITH YOUR NAUSEA MEDICATION  *UNUSUAL SHORTNESS OF BREATH  *UNUSUAL BRUISING OR BLEEDING  TENDERNESS IN MOUTH AND THROAT WITH OR WITHOUT PRESENCE OF ULCERS  *URINARY PROBLEMS  *BOWEL PROBLEMS  UNUSUAL RASH Items with * indicate a potential emergency and should be followed up as soon as possible. If you have an emergency after office hours please contact your primary care physician or go to the nearest emergency department.  Please call the clinic during office hours if you have any questions or concerns.   You may also contact the Patient Navigator at [phone] should you have any questions or need assistance in obtaining follow up care.   Resources For Cancer Patients and their Caregivers ? American Cancer Society: Can assist with transportation, wigs, general needs, runs Look Good Feel Better.        ([phone] 415-6675 ? Cancer Care: Provides financial assistance, online support groups,  medication/co-pay assistance.  1-800-813-HOPE 760-440-5640) ? [place] Assists [place] cancer patients and their families through emotional , educational and financial support.  684-149-3166 ? [place] DSS Where to apply for food stamps, Medicaid and utility assistance. (908)236-6318 ? [employer]: Transportation to medical appointments. 6575349439 ? Social Security Administration: May apply for disability if have a Stage IV cancer. [phone] 859-227-0687 ? [employer], Disability and Transit Services: Assists with nutrition, care and transit needs. [phone]

## 2015-07-03 LAB — TYPE AND SCREEN
ABO/RH(D): A POS
Antibody Screen: NEGATIVE
Unit division: 0

## 2015-07-04 ENCOUNTER — Encounter (HOSPITAL_COMMUNITY): Payer: Self-pay

## 2015-07-04 ENCOUNTER — Encounter (HOSPITAL_BASED_OUTPATIENT_CLINIC_OR_DEPARTMENT_OTHER): Payer: Commercial Managed Care - HMO

## 2015-07-04 VITALS — BP 115/57 | HR 78 | Temp 98.9°F | Resp 16

## 2015-07-04 DIAGNOSIS — C19 Malignant neoplasm of rectosigmoid junction: Secondary | ICD-10-CM | POA: Diagnosis not present

## 2015-07-04 DIAGNOSIS — C787 Secondary malignant neoplasm of liver and intrahepatic bile duct: Principal | ICD-10-CM

## 2015-07-04 DIAGNOSIS — Z452 Encounter for adjustment and management of vascular access device: Secondary | ICD-10-CM | POA: Diagnosis not present

## 2015-07-04 DIAGNOSIS — C189 Malignant neoplasm of colon, unspecified: Secondary | ICD-10-CM

## 2015-07-04 MED ORDER — SODIUM CHLORIDE 0.9 % IJ SOLN
10.0000 mL | INTRAMUSCULAR | Status: DC | PRN
Start: 2015-07-04 — End: 2015-07-04
  Administered 2015-07-04: 10 mL
  Filled 2015-07-04: qty 10

## 2015-07-04 MED ORDER — HEPARIN SOD (PORK) LOCK FLUSH 100 UNIT/ML IV SOLN
500.0000 [IU] | Freq: Once | INTRAVENOUS | Status: AC | PRN
  Administered 2015-07-04: 500 [IU]
  Filled 2015-07-04: qty 5

## 2015-07-04 NOTE — Progress Notes (Signed)
Sydney Morrison returns today for port de access and flush after 46 hr continous infusion of 70fu. Tolerated infusion without problems. Portacath located lt chest wall was  deaccessed and flushed with 35ml NS and 500U/15ml Heparin and needle removed intact. Chief c/o headache because of someone wearing strong cologne in waiting room. Some nausea, no vomiting Procedure without incident. Patient tolerated procedure well.

## 2015-07-10 ENCOUNTER — Ambulatory Visit
Admission: RE | Admit: 2015-07-10 | Discharge: 2015-07-10 | Disposition: A | Discharge status: Home / Self Care | Payer: Commercial Managed Care - HMO | Source: Ambulatory Visit | Attending: Radiology | Admitting: Radiology

## 2015-07-10 DIAGNOSIS — C787 Secondary malignant neoplasm of liver and intrahepatic bile duct: Principal | ICD-10-CM

## 2015-07-10 DIAGNOSIS — C189 Malignant neoplasm of colon, unspecified: Secondary | ICD-10-CM

## 2015-07-10 NOTE — Progress Notes (Signed)
Chief Complaint: Follow-up after transcatheter Y-90 radioembolization of the right lobe of the liver on 05/31/2015 to treat metastatic colon carcinoma.  History of Present Illness: Sydney Morrison is a 59 y.o. female who tolerated right lobe Y-90 radioembolization initially well with some fatigue. She did start to experience some significant right upper quadrant abdominal pain overlying the liver 2 weeks after treatment and was seen in the emergency department on 06/18/2015 with CT demonstrating no significant change in metastatic lesions within the right and left lobes. She has been dealing with a recurrent sinus infection and has undergone 2 separate rounds of antibiotic treatment. She still has some fatigue. She was transfused 1 unit of blood recently and also received chemotherapy last Monday. Her next chemotherapy is scheduled for next week. She still feels that her sinus infection has not completely cleared with complaint of some continued purulent nasal discharge.  Past Medical History  Diagnosis Date  . Crohn's disease ([place])   . Chronic headache   . Cancer ([place])   . Nausea and vomiting 08/25/2014  . Sinus infection   . PONV (postoperative nausea and vomiting)     with all sedation medications  . Noncompliance 06/24/2015    Past Surgical History  Procedure Laterality Date  . Breast surgery    . Uterine fibroid surgery    . Colonoscopy w/ biopsies  06/29/2014    DR HUNG  . Flexible sigmoidoscopy N/A 06/30/2014    Procedure: FLEXIBLE SIGMOIDOSCOPY;  Surgeon: Carol Ada, MD;  Location: [facility] ENDOSCOPY;  Service: Endoscopy;  Laterality: N/A;  . Colonic stent placement N/A 06/30/2014    Procedure: COLONIC STENT PLACEMENT;  Surgeon: Carol Ada, MD;  Location: [facility] ENDOSCOPY;  Service: Endoscopy;  Laterality: N/A;  with fluro   . Portacath placement N/A 07/04/2014    Procedure: POWER PORT PLACEMENT;  Surgeon: Alphonsa Overall, MD;  Location: [place];  Service: General;  Laterality: N/A;  .  Laparotomy N/A 08/31/2014    Procedure: EXPLORATORY LAPAROTOMY;  Surgeon: Michael Boston, MD;  Location: [facility]S;  Service: General;  Laterality: N/A;  . Bowel resection N/A 08/31/2014    Procedure: SMALL BOWEL RESECTION;  Surgeon: Michael Boston, MD;  Location: [facility];  Service: General;  Laterality: N/A;  . Colostomy N/A 08/31/2014    Procedure: low anterior resection with COLOSTOMY;  Surgeon: Michael Boston, MD;  Location: [facility];  Service: General;  Laterality: N/A;  . Appendectomy  08/31/2014    Procedure: APPENDECTOMY;  Surgeon: Michael Boston, MD;  Location: [facility];  Service: General;;  . Supracervical abdominal hysterectomy  08/31/2014    Procedure: HYSTERECTOMY SUPRACERVICAL ABDOMINAL BILATERAL TUBES AND OVARIES;  Surgeon: Michael Boston, MD;  Location: [facility];  Service: General;;  . Transverse colon resection  08/31/2014    Procedure: TRANSVERSE COLON RESECTION;  Surgeon: Michael Boston, MD;  Location: [facility];  Service: General;;  . Debulking  08/31/2014    Procedure: DEBULKING OF PERITONEUM;  Surgeon: Michael Boston, MD;  Location: [facility];  Service: General;;    Allergies: Avelox; Codeine; Dextromethorphan; Erythromycin; Penicillins; Percocet; Reglan; Zofran; Clindamycin hcl; Red dye; and Suprep  Medications: Prior to Admission medications   Medication Sig Start Date End Date Taking? Authorizing Provider  acetaminophen (TYLENOL) 325 MG tablet Take 325 mg by mouth every 6 (six) hours as needed for fever or headache (headahce).    Yes Historical Provider, MD  ALPRAZolam Duanne Moron) 0.5 MG tablet Take 0.125-0.5 mg by mouth at bedtime as needed for sleep.  09/17/14  Yes Historical Provider, MD  aprepitant (EMEND) 40 MG capsule Take 40 mg by mouth daily.   Yes Historical Provider, MD  benzocaine (ZILACTIN-B) 10 % mucosal gel Use as directed 1 application in the mouth or throat as needed for mouth pain. Reported on 03/27/2015   Yes Historical Provider, MD  cromolyn (NASALCROM) 5.2 MG/ACT nasal spray Place 1 spray into  both nostrils at bedtime.   Yes Historical Provider, MD  Cyanocobalamin (VITAMIN B-12 CR) 1500 MCG TBCR Take 1 tablet by mouth daily.   Yes Historical Provider, MD  dexamethasone (DECADRON) 4 MG tablet Take 1 tablet (4 mg total) by mouth 2 (two) times daily. For 2 days. Begin day of pump disconnect. 02/14/15  Yes Manon Hilding Kefalas, PA-C  docusate calcium (SURFAK) 240 MG capsule Take 240 mg by mouth daily as needed for mild constipation.   Yes Historical Provider, MD  GENERLAC 10 GM/15ML SOLN Take 15 mLs (10 g total) by mouth daily as needed (for constipation). 03/29/15  Yes Baird Cancer, PA-C  irinotecan in dextrose 5 % 500 mL Inject into the vein once.   Yes Historical Provider, MD  LEUCOVORIN CALCIUM IV Inject into the vein. Reported on 03/27/2015   Yes Historical Provider, MD  lidocaine-prilocaine (EMLA) cream Apply 1 application topically as needed. Apply to portacath site 1-2 hours prior to use 07/14/14  Yes Owens Shark, NP  loperamide (IMODIUM A-D) 2 MG tablet Take 2 mg by mouth 4 (four) times daily as needed for diarrhea or loose stools. Reported on 05/08/2015   Yes Historical Provider, MD  Multiple Vitamins-Minerals (MULTIVITAMIN WITH MINERALS) tablet Take 1 tablet by mouth daily.   Yes Historical Provider, MD  nicotine (NICODERM CQ - DOSED IN MG/24 HR) 7 mg/24hr patch Place 7 mg onto the skin daily.   Yes Historical Provider, MD  doxycycline (VIBRA-TABS) 100 MG tablet Take 1 tablet (100 mg total) by mouth 2 (two) times daily. Patient not taking: Reported on 07/10/2015 06/25/15   Manon Hilding Kefalas, PA-C  ENSURE (ENSURE) Take 237 mLs by mouth 2 (two) times daily with a meal. Reported on 07/10/2015    Historical Provider, MD  fentaNYL (DURAGESIC) 25 MCG/HR patch Place 1 patch (25 mcg total) onto the skin every 3 (three) days. Patient not taking: Reported on 07/10/2015 06/18/15   Noemi Chapel, MD  Fluorouracil (ADRUCIL IV) Inject into the vein every 14 (fourteen) days.    Historical Provider, MD    LORazepam (ATIVAN) 0.5 MG tablet Take 1 tablet (0.5 mg total) by mouth every 6 (six) hours as needed (nausea/vomiting). May take SL if concern for vomiting exists Patient not taking: Reported on 07/10/2015 03/23/15   Baird Cancer, PA-C     Family History  Problem Relation Age of Onset  . Hypertension Mother   . Cancer Father     Social History   Social History  . Marital Status: Married    Spouse Name: N/A  . Number of Children: N/A  . Years of Education: N/A   Social History Main Topics  . Smoking status: Former Smoker -- 0.50 packs/day for 40 years    Types: Cigarettes    Quit date: 06/25/2014  . Smokeless tobacco: Never Used  . Alcohol Use: No  . Drug Use: No  . Sexual Activity: Not on file   Other Topics Concern  . Not on file   Social History Narrative   Married, husband Roderic Palau   Two grown children   Strong christian faith  ECOG Status: 1 - Symptomatic but completely ambulatory  Review of Systems: A 12 point ROS discussed and pertinent positives are indicated in the HPI above.  All other systems are negative.  Review of Systems  Constitutional: Positive for fatigue.  HENT: Positive for sinus pressure.        Purulent nasal discharge.  Respiratory: Negative.   Cardiovascular: Negative.   Gastrointestinal: Positive for nausea and abdominal pain. Negative for vomiting, blood in stool and abdominal distention.  Genitourinary: Negative.   Musculoskeletal: Negative.   Neurological: Negative.     Vital Signs: BP 109/60 mmHg  Pulse 80  Temp(Src) 98.8 F (37.1 C) (Oral)  Resp 15  Ht _0  (1.702 m)  Wt 104 lb 6.4 oz (47.356 kg)  BMI 16.35 kg/m2  SpO2 100%  Physical Exam  Constitutional: She is oriented to person, place, and time. No distress.  Abdominal: Soft. She exhibits no distension. There is no tenderness. There is no rebound and no guarding.  Neurological: She is alert and oriented to person, place, and time.  Skin: She is not  diaphoretic.  Nursing note and vitals reviewed.   Imaging: Dg Chest 2 View  06/25/2015  CLINICAL DATA:  61 year old female with colorectal carcinoma metastatic to the liver. Recently status post Y 90 microsphere treatment. Recent symptoms of cough, shortness of breath, sinus infection. Initial encounter. EXAM: CHEST  2 VIEW COMPARISON:  Restaging chest abdomen and pelvis CT 04/06/2015. Chest radiographs 02/18/2015. FINDINGS: Stable left chest subclavian approach porta cath. Mediastinal contours are stable and within normal limits. Stable lung volumes. No pneumothorax, pulmonary edema, pleural effusion or confluent pulmonary opacity. Stable mild increased interstitial markings diffusely. Osteopenia. No acute osseous abnormality identified. IMPRESSION: No acute cardiopulmonary abnormality. Electronically Signed   By: Genevie Ann M.D.   On: 06/25/2015 11:16   Ct Abdomen Pelvis W Contrast  06/18/2015  CLINICAL DATA:  Progressive right upper quadrant abdominal pain since Y-90 therapy 05/31/2015. Metastatic colon cancer. EXAM: CT ABDOMEN AND PELVIS WITH CONTRAST TECHNIQUE: Multidetector CT imaging of the abdomen and pelvis was performed using the standard protocol following bolus administration of intravenous contrast. CONTRAST:  167m OMNIPAQUE IOHEXOL 300 MG/ML  SOLN COMPARISON:  CT 04/06/2015. FINDINGS: Lower chest: Mildly increased subsegmental atelectasis at both lung bases. No significant pleural or pericardial effusion. Hepatobiliary: Again demonstrated is multifocal hepatic metastatic disease, demonstrating peripheral heterogeneous enhancement. The dominant lesion within the right lobe measures approximately 8.5 x 5.3 cm on image 20 (previously 8.1 x 5.2 cm). The lesion involving segments 2 and 3 measures approximately 3.9 x 2.2 cm on image 26 (previously 3.8 x 2.3 cm). The lesion in segment 5 measures 3.2 x 2.3 cm on image 32 (previously 2.8 x 2.2 cm). The subcapsular fluid collection anterior to the right  lobe on the most recent study has decreased in size. No evidence of acute hemorrhage. No evidence of gallstones, gallbladder wall thickening or biliary dilatation. Pancreas: Unremarkable. No pancreatic ductal dilatation or surrounding inflammatory changes. Spleen: Normal in size without focal abnormality. Adrenals/Urinary Tract: Both adrenal glands appear normal. The kidneys appear normal without evidence of urinary tract calculus, suspicious lesion or hydronephrosis. No bladder abnormalities are seen. Stomach/Bowel: No evidence of bowel wall thickening, distention or surrounding inflammatory change. Stable postsurgical changes status post low anterior resection and sigmoid colostomy. There are postsurgical changes in the proximal transverse colon status post appendectomy. Vascular/Lymphatic: There are no enlarged abdominal or pelvic lymph nodes. Stable aortic and branch vessel atherosclerosis. No evidence of  large vessel occlusion. Reproductive: No evidence of ascites or peritoneal nodularity. The anterior abdominal wall has a stable appearance. Other: Hysterectomy.  No evidence of adnexal mass. Musculoskeletal: No acute or significant osseous findings. IMPRESSION: 1. No acute findings or explanation for the patient's symptoms. No evidence of complication from previous Y 90 therapy. 2. The multifocal hepatic metastatic disease has not substantially changed from the prior study of 10 weeks ago. 3. Mildly increased bibasilar atelectasis. 4. Stable postsurgical changes within the abdomen and moderate atherosclerosis. Electronically Signed   By: Richardean Sale M.D.   On: 06/18/2015 15:11    Labs:  CBC:  Recent Labs  06/12/15 0854 06/18/15 1146 06/25/15 0909 07/02/15 0930  WBC 5.0 6.6 10.7* 8.9  HGB 8.5* 8.9* 8.3* 7.4*  HCT 28.5* 29.8* 27.9* 24.6*  PLT 266 211 242 262    COAGS:  Recent Labs  08/25/14 1734 05/14/15 0656 05/31/15 0810  INR 1.18 1.13 1.11  APTT 30 38* 43*    BMP:  Recent  Labs  06/12/15 0854 06/18/15 1146 06/25/15 0909 07/02/15 0930  NA 138 138 134* 135  K 3.6 3.7 3.7 3.8  CL 102 101 99* 101  CO2 _0 GLUCOSE 111* 136* 138* 103*  BUN 19 22* 20 22*  CALCIUM 9.0 8.9 8.5* 8.4*  CREATININE 0.61 0.50 0.63 0.44  GFRNONAA >60 >60 >60 >60  GFRAA >60 >60 >60 >60    LIVER FUNCTION TESTS:  Recent Labs  06/12/15 0854 06/18/15 1146 06/25/15 0909 07/02/15 0930  BILITOT 0.2* 0.2* 0.5 0.3  AST 27 43* 33 31  ALT 20 36 30 21  ALKPHOS 186* 194* 203* 185*  PROT 7.3 7.2 7.5 6.9  ALBUMIN 3.2* 3.2* 3.0* 2.7*    TUMOR MARKERS:  Recent Labs  03/12/15 0830 04/10/15 0839 05/08/15 0934 06/12/15 0854  CEA 16.2* 20.2* 31.6* 39.7*    Assessment and Plan:  I met with Mrs. Heskett and her husband. She does seem to be slowly improving with some mild residual right upper quadrant pain remaining. She points to a region along the right lateral costal margin. There may be a combination of metastatic disease causing capsular distention at the level of the largest bulk of right lobe tumor and also potentially some inflammation from continued tumor lysis after radioembolization. The CT study obtained 18 days after treatment shows no evidence of complication following the procedure. At that point, it would have been too early to detect any significant treatment response. CEA level did go up slightly 2 weeks after treatment.   I recommended further follow-up with Dr. [name] regarding chemotherapy. As discussed previously, the left lobe of the liver will likely not be able to be treated safely by transcatheter means given prominent gastric branches from replaced left lobe hepatic supply off of the left gastric artery. I recommended that we obtain a PET scan soon, possibly in May, to assess treatment response better in the liver. Should there be some measurable treatment response in the right lobe, future repeat radioembolization to the right lobe could be considered if  there is further progression of disease on chemotherapy.   Electronically SignedAletta Edouard T 07/10/2015, 4:31 PM   I spent a total of 15 Minutes in face to face in clinical consultation, greater than 50% of which was counseling/coordinating care for metastatic colon carcinoma to the liver.

## 2015-07-15 NOTE — Assessment & Plan Note (Addendum)
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 Michael Boston on 08/31/2014 when she was admitted with SBO. HER2 NEGATIVE and preserved expression of the major and minor MMR proteins, thus there is a very low probability that microsatellite instability (MSI) is present.  Oncology history is updated.  Pre-treatment labs as ordered: CBC diff, CMET, CEA.  Will get a UA and blood cultures today for work-up of patient reported history.  HGB is 7.7 g/dL and therefore, she is offered a 1 unit PRBC.  She is agreeable to this and orders are placed for 1 unit of blood.  She notes that she did not tolerate Fentanyl as she developed nausea 30 minutes into placing her fentanyl patch and removed the patch 3 hours later.  She has not used Tramadol at home to date.  She is not interested in using Tramadol.  She notes that she is using Tylenol.  She is currently being treated for bacterial vaginosis by Dr. Nevada Crane (PCP) and colleagues.  She notes continued pruritis, but without discharge.  She is currently finishing Flagyl therapy.  PET scan is ordered in 4 week to evaluate treatment response to therapy, including liver targeted therapy.  Return in 4-5 weeks for follow-up, pre-treatment labs, and follow-up appointment.

## 2015-07-15 NOTE — Progress Notes (Signed)
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 [phone] - Plan: NM PET Image Restag (PS) Skull Base To Thigh, Urinalysis, Routine w reflex microscopic, Culture, blood (routine x 2), Culture, blood (routine x 2), Practitioner attestation of consent, Complete patient signature process for consent form, Care order/instruction, Type and screen, DISCONTINUED: 0.9 %  sodium chloride infusion, DISCONTINUED: sodium chloride flush (NS) 0.9 % injection 10 mL, DISCONTINUED: heparin lock flush 100 unit/mL, CANCELED: Prepare RBC, CANCELED: Transfuse RBC, DISCONTINUED: acetaminophen (TYLENOL) tablet 650 mg  CURRENT THERAPY: FOLFIRI without Avastin (patient's preference)  INTERVAL HISTORY: Sydney Morrison 60 y.o. 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 Michael Boston on 08/31/2014 when she was admitted with SBO.  HER2 NEGATIVE and preserved expression of the major and minor MMR proteins, thus there is a very low probability that microsatellite instability (MSI) is present.     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 Diffuse liver metastases   07/02/2014 Imaging Changes consistent with the known history of colon carcinoma with metastatic disease involving the liver and peritoneum. Recent stent placement within the colonic lesion. Some fecal material is noted within the proximal portion of the stent which a...   07/05/2014 Pathology Results Liver, needle/core biopsy, left lobe - METASTATIC  ADENOCARCINOMA   07/05/2014 Pathology Results There is preserved expression of the major and minor MMR proteins. There is a very low probability that microsatellite instability (MSI) is present.   07/05/2014 Pathology Results HER 2 NEGATIVE   07/19/2014 - 08/16/2014 Chemotherapy FOLFOX   07/23/2014 Imaging Similar findings of advanced stage IV metastatic colon cancer with perhaps slight interval progression of disease in the liver.   08/25/2014 Treatment Plan Change Chemotherapy held due to hospitalization   08/25/2014 - 09/21/2014 Hospital Admission SBO   08/26/2014 Imaging High-grade distal small bowel obstruction, likely from peritoneal metastatic disease.  Extensive hepatic metastatic disease. The largest metastasis in the left lobe has decreased from 07/23/2014.   08/31/2014 Surgery Colon, segmental resection for tumor, rectum - INVASIVE ADENOCARCINOMA, MODERATELY DIFFERENTIATED - TUMOR INVADES THROUGH SEROSA, SEE COMMENT. - PERFORATION WITH VISIBLE STENT. - LYMPHOVASCULAR AND PERINEURAL INVASION IS PRESENT. - DISTAL AND PROX...   09/15/2014 Imaging No bowel obstruction suspected status post multifocal small and large bowel resection with both primary reanastomoses and descending colostomy. Patulous appearance of colon in the right abdomen might reflect focal ileus.   11/06/2014 - 01/17/2015 Chemotherapy FOLFOX   11/28/2014 - 11/30/2014 Hospital Admission 1.   Nausea and vomiting 2.   Constipation    01/18/2015 - 01/22/2015 Hospital Admission Nausea and vomiting, UTI (lower urinary tract infection)   01/29/2015 Progression CT CAP- Progressive hepatic metastatic disease. New and enlarging heterogeneous nodules and masses in the liver measure up to 5.2 x 6.7 cm (image 56), previously 3.3 x 5.1 cm on 09/15/2014.   02/12/2015 -  Chemotherapy FOLFIRI, patient refuses Avastin.  5FU bolus deleted without administration beginning on cycle 1.   04/06/2015 Imaging CT C/A/P mixed response with decreased size of several hepatic  lesions, increased size of others, dominant lesion in  RL of liver 8.1x5.2x8.4cm, large serosal implant overlying RL of liver appears increased   05/31/2015 Procedure Arteriography and Y-90 embolization performed of replaced R hepatic artery off SMA. 26 mCI prescribed dose of SIR spheres   06/18/2015 Imaging CT abd/pelvis- No acute findings or explanation for the patient's symptoms. No evidence of complication from previous Y 90 therapy. The multifocal hepatic metastatic disease has not substantially changed from the prior study of 10 weeks ago.    I personally reviewed and went over laboratory results with the patient.  The results are noted within this dictation.  Labs will be updated today. Anemia is noted today and she is offered a 1 unit PRBC transfusion which she is agreeable to receiving.  Her QPR:[id number] ratio is > 20 and she declines IV fluids today.  She wants to drink PO fluids at home.  UA is unimpressive from an infection standpoint.  Chart reviewed.  Recent appointment with Dr.Yamagata is noted: Assessment and Plan:  I met with Mrs. Moncrief and her husband. She does seem to be slowly improving with some mild residual right upper quadrant pain remaining. She points to a region along the right lateral costal margin. There may be a combination of metastatic disease causing capsular distention at the level of the largest bulk of right lobe tumor and also potentially some inflammation from continued tumor lysis after radioembolization. The CT study obtained 18 days after treatment shows no evidence of complication following the procedure. At that point, it would have been too early to detect any significant treatment response. CEA level did go up slightly 2 weeks after treatment.  I recommended further follow-up with Dr. [name] regarding chemotherapy. As discussed previously, the left lobe of the liver will likely not be able to be treated safely by transcatheter means given prominent gastric  branches from replaced left lobe hepatic supply off of the left gastric artery. I recommended that we obtain a PET scan soon, possibly in May, to assess treatment response better in the liver. Should there be some measurable treatment response in the right lobe, future repeat radioembolization to the right lobe could be considered if there is further progression of disease on chemotherapy.  She reports a 6 day history of fevers and chills. She notes that she is under treatment by primary care provider for bacterial vaginosis. She recently completed antibiotic course for this. She notes that he vaginal discharge that is now clear in color.  She refuses chemotherapy today. We will work on symptom management today. Unfortunately, the more she refuses chemotherapy the more detriment she has causing with regards to her oncology care. Her labs need treatment parameters today.  Past Medical History  Diagnosis Date  . Crohn's disease ([place])   . Chronic headache   . Cancer ([place])   . Nausea and vomiting 08/25/2014  . Sinus infection   . PONV (postoperative nausea and vomiting)     with all sedation medications  . 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     is allergic to avelox; codeine; dextromethorphan; erythromycin; penicillins; percocet; reglan; zofran; clindamycin hcl; red dye; and suprep.  Current Outpatient Prescriptions on File Prior to Visit  Medication Sig Dispense Refill  .  acetaminophen (TYLENOL) 325 MG tablet Take 325 mg by mouth every 6 (six) hours as needed  for fever or headache (headahce).     . ALPRAZolam (XANAX) 0.5 MG tablet Take 0.125-0.5 mg by mouth at bedtime as needed for sleep.     Marland Kitchen aprepitant (EMEND) 40 MG capsule Take 40 mg by mouth daily.    . benzocaine (ZILACTIN-B) 10 % mucosal gel Use as directed 1 application in the mouth or throat as needed for mouth pain. Reported on 03/27/2015    . cromolyn (NASALCROM) 5.2 MG/ACT nasal spray Place 1 spray into both nostrils at bedtime.    . Cyanocobalamin (VITAMIN B-12 CR) 1500 MCG TBCR Take 1 tablet by mouth daily.    Marland Kitchen dexamethasone (DECADRON) 4 MG tablet Take 1 tablet (4 mg total) by mouth 2 (two) times daily. For 2 days. Begin day of pump disconnect. 8 tablet 2  . docusate calcium (SURFAK) 240 MG capsule Take 240 mg by mouth daily as needed for mild constipation.    . ENSURE (ENSURE) Take 237 mLs by mouth 2 (two) times daily with a meal. Reported on 07/10/2015    . fentaNYL (DURAGESIC) 25 MCG/HR patch Place 1 patch (25 mcg total) onto the skin every 3 (three) days. (Patient not taking: Reported on 07/10/2015) 5 patch 0  . Fluorouracil (ADRUCIL IV) Inject into the vein every 14 (fourteen) days.    . GENERLAC 10 GM/15ML SOLN Take 15 mLs (10 g total) by mouth daily as needed (for constipation). 2700 mL 2  . irinotecan in dextrose 5 % 500 mL Inject into the vein once.    Marland Kitchen LEUCOVORIN CALCIUM IV Inject into the vein. Reported on 03/27/2015    . lidocaine-prilocaine (EMLA) cream Apply 1 application topically as needed. Apply to portacath site 1-2 hours prior to use 30 g 3  . loperamide (IMODIUM A-D) 2 MG tablet Take 2 mg by mouth 4 (four) times daily as needed for diarrhea or loose stools. Reported on 05/08/2015    . LORazepam (ATIVAN) 0.5 MG tablet Take 1 tablet (0.5 mg total) by mouth every 6 (six) hours as needed (nausea/vomiting). May take SL if concern for vomiting exists (Patient not taking: Reported on 07/10/2015) 45 tablet 2  . Multiple Vitamins-Minerals (MULTIVITAMIN WITH MINERALS) tablet Take 1  tablet by mouth daily.    . nicotine (NICODERM CQ - DOSED IN MG/24 HR) 7 mg/24hr patch Place 7 mg onto the skin daily.     Current Facility-Administered Medications on File Prior to Visit  Medication Dose Route Frequency Provider Last Rate Last Dose  . atropine injection 0.5 mg  0.5 mg Intravenous Once PRN Patrici Ranks, MD   0.5 mg at 02/12/15 1001  . heparin lock flush 100 unit/mL  500 Units Intracatheter Once PRN Patrici Ranks, MD      . heparin lock flush 100 unit/mL  500 Units Intracatheter Daily PRN Baird Cancer, PA-C      . promethazine (PHENERGAN) injection 12.5 mg  12.5 mg Intravenous Q4H PRN Patrici Ranks, MD   12.5 mg at 02/12/15 1000  . sodium chloride 0.9 % injection 10 mL  10 mL Intracatheter PRN Patrici Ranks, MD      . sodium chloride flush (NS) 0.9 % injection 10 mL  10 mL Intracatheter PRN Baird Cancer, PA-C        Past Surgical History  Procedure Laterality Date  . Breast surgery    . Uterine fibroid surgery    .  Colonoscopy w/ biopsies  06/29/2014    DR HUNG  . Flexible sigmoidoscopy N/A 06/30/2014    Procedure: FLEXIBLE SIGMOIDOSCOPY;  Surgeon: Carol Ada, MD;  Location: [facility] ENDOSCOPY;  Service: Endoscopy;  Laterality: N/A;  . Colonic stent placement N/A 06/30/2014    Procedure: COLONIC STENT PLACEMENT;  Surgeon: Carol Ada, MD;  Location: [facility] ENDOSCOPY;  Service: Endoscopy;  Laterality: N/A;  with fluro   . Portacath placement N/A 07/04/2014    Procedure: POWER PORT PLACEMENT;  Surgeon: Alphonsa Overall, MD;  Location: [facility];  Service: General;  Laterality: N/A;  . Laparotomy N/A 08/31/2014    Procedure: EXPLORATORY LAPAROTOMY;  Surgeon: Michael Boston, MD;  Location: [facility];  Service: General;  Laterality: N/A;  . Bowel resection N/A 08/31/2014    Procedure: SMALL BOWEL RESECTION;  Surgeon: Michael Boston, MD;  Location: [facility];  Service: General;  Laterality: N/A;  . Colostomy N/A 08/31/2014    Procedure: low anterior resection with COLOSTOMY;  Surgeon:  Michael Boston, MD;  Location: [facility];  Service: General;  Laterality: N/A;  . Appendectomy  08/31/2014    Procedure: APPENDECTOMY;  Surgeon: Michael Boston, MD;  Location: [facility];  Service: General;;  . Supracervical abdominal hysterectomy  08/31/2014    Procedure: HYSTERECTOMY SUPRACERVICAL ABDOMINAL BILATERAL TUBES AND OVARIES;  Surgeon: Michael Boston, MD;  Location: [facility];  Service: General;;  . Transverse colon resection  08/31/2014    Procedure: TRANSVERSE COLON RESECTION;  Surgeon: Michael Boston, MD;  Location: [facility];  Service: General;;  . Debulking  08/31/2014    Procedure: DEBULKING OF PERITONEUM;  Surgeon: Michael Boston, MD;  Location: [facility];  Service: General;;    Denies any headaches, dizziness, double vision, fevers, chills, night sweats, nausea, vomiting, diarrhea, constipation, chest pain, heart palpitations, shortness of breath, blood in stool, black tarry stool, urinary pain, urinary burning, urinary frequency, hematuria.   PHYSICAL EXAMINATION  ECOG PERFORMANCE STATUS: 1 - Symptomatic but completely ambulatory  There were no vitals filed for this visit.  GENERAL:alert, no distress, well nourished, well developed, cachectic, comfortable, cooperative, smiling and accompanied by her husband. SKIN: skin color, texture, turgor are normal, no rashes or significant lesions HEAD: Normocephalic, No masses, lesions, tenderness or abnormalities EYES: normal, EOMI, Conjunctiva are pink and non-injected EARS: External ears normal OROPHARYNX:lips, buccal mucosa, and tongue normal and mucous membranes are moist,  NECK: supple, trachea midline,  LYMPH:  no palpable lymphadenopathy BREAST:not examined LUNGS: clear to auscultation and percussion without wheezes, rales, or rhonchi. HEART: regular rate & rhythm, no murmurs, no gallops, S1 normal and S2 normal ABDOMEN:abdomen soft, normal bowel sounds and colostomy BACK: Back symmetric, no curvature. EXTREMITIES:less then 2 second capillary refill,  no joint deformities, effusion, or inflammation, no skin discoloration, no cyanosis  NEURO: alert & oriented x 3 with fluent speech, no focal motor/sensory deficits, gait normal   LABORATORY DATA: CBC    Component Value Date/Time   WBC 7.2 07/16/2015 1024   WBC 5.5 08/16/2014 1037   RBC 3.07* 07/16/2015 1024   RBC 2.98* 07/02/2015 0930   RBC 3.61* 08/16/2014 1037   HGB 7.7* 07/16/2015 1024   HGB 9.6* 08/16/2014 1037   HCT 25.1* 07/16/2015 1024   HCT 30.9* 08/16/2014 1037   PLT 200 07/16/2015 1024   PLT 167 08/16/2014 1037   MCV 81.8 07/16/2015 1024   MCV 85.6 08/16/2014 1037   MCH 25.1* 07/16/2015 1024   MCH 26.6 08/16/2014 1037   MCHC 30.7 07/16/2015 1024   MCHC 31.1* 08/16/2014  1037   RDW 17.5* 07/16/2015 1024   RDW 16.1* 08/16/2014 1037   LYMPHSABS 0.7 07/16/2015 1024   LYMPHSABS 1.2 08/16/2014 1037   MONOABS 1.2* 07/16/2015 1024   MONOABS 0.9 08/16/2014 1037   EOSABS 0.1 07/16/2015 1024   EOSABS 0.2 08/16/2014 1037   BASOSABS 0.0 07/16/2015 1024   BASOSABS 0.0 08/16/2014 1037      Chemistry      Component Value Date/Time   NA 133* 07/16/2015 1024   NA 140 08/16/2014 1037   K 4.1 07/16/2015 1024   K 4.2 08/16/2014 1037   CL 99* 07/16/2015 1024   CO2 25 07/16/2015 1024   CO2 28 08/16/2014 1037   BUN 19 07/16/2015 1024   BUN 23.1 08/16/2014 1037   CREATININE 0.50 07/16/2015 1024   CREATININE 0.7 08/16/2014 1037      Component Value Date/Time   CALCIUM 8.3* 07/16/2015 1024   CALCIUM 9.5 08/16/2014 1037   ALKPHOS 170* 07/16/2015 1024   ALKPHOS 143 08/16/2014 1037   AST 29 07/16/2015 1024   AST 21 08/16/2014 1037   ALT 19 07/16/2015 1024   ALT 15 08/16/2014 1037   BILITOT 0.3 07/16/2015 1024   BILITOT 0.28 08/16/2014 1037      Lab Results  Component Value Date   CEA 39.7* 06/12/2015      PENDING LABS:   RADIOGRAPHIC STUDIES:  Dg Chest 2 View  06/25/2015  CLINICAL DATA:  60 year old female with colorectal carcinoma metastatic to the liver.  Recently status post Y 90 microsphere treatment. Recent symptoms of cough, shortness of breath, sinus infection. Initial encounter. EXAM: CHEST  2 VIEW COMPARISON:  Restaging chest abdomen and pelvis CT 04/06/2015. Chest radiographs 02/18/2015. FINDINGS: Stable left chest subclavian approach porta cath. Mediastinal contours are stable and within normal limits. Stable lung volumes. No pneumothorax, pulmonary edema, pleural effusion or confluent pulmonary opacity. Stable mild increased interstitial markings diffusely. Osteopenia. No acute osseous abnormality identified. IMPRESSION: No acute cardiopulmonary abnormality. Electronically Signed   By: Genevie Ann M.D.   On: 06/25/2015 11:16   Ct Abdomen Pelvis W Contrast  06/18/2015  CLINICAL DATA:  Progressive right upper quadrant abdominal pain since Y-90 therapy 05/31/2015. Metastatic colon cancer. EXAM: CT ABDOMEN AND PELVIS WITH CONTRAST TECHNIQUE: Multidetector CT imaging of the abdomen and pelvis was performed using the standard protocol following bolus administration of intravenous contrast. CONTRAST:  138m OMNIPAQUE IOHEXOL 300 MG/ML  SOLN COMPARISON:  CT 04/06/2015. FINDINGS: Lower chest: Mildly increased subsegmental atelectasis at both lung bases. No significant pleural or pericardial effusion. Hepatobiliary: Again demonstrated is multifocal hepatic metastatic disease, demonstrating peripheral heterogeneous enhancement. The dominant lesion within the right lobe measures approximately 8.5 x 5.3 cm on image 20 (previously 8.1 x 5.2 cm). The lesion involving segments 2 and 3 measures approximately 3.9 x 2.2 cm on image 26 (previously 3.8 x 2.3 cm). The lesion in segment 5 measures 3.2 x 2.3 cm on image 32 (previously 2.8 x 2.2 cm). The subcapsular fluid collection anterior to the right lobe on the most recent study has decreased in size. No evidence of acute hemorrhage. No evidence of gallstones, gallbladder wall thickening or biliary dilatation. Pancreas:  Unremarkable. No pancreatic ductal dilatation or surrounding inflammatory changes. Spleen: Normal in size without focal abnormality. Adrenals/Urinary Tract: Both adrenal glands appear normal. The kidneys appear normal without evidence of urinary tract calculus, suspicious lesion or hydronephrosis. No bladder abnormalities are seen. Stomach/Bowel: No evidence of bowel wall thickening, distention or surrounding inflammatory change. Stable  postsurgical changes status post low anterior resection and sigmoid colostomy. There are postsurgical changes in the proximal transverse colon status post appendectomy. Vascular/Lymphatic: There are no enlarged abdominal or pelvic lymph nodes. Stable aortic and branch vessel atherosclerosis. No evidence of large vessel occlusion. Reproductive: No evidence of ascites or peritoneal nodularity. The anterior abdominal wall has a stable appearance. Other: Hysterectomy.  No evidence of adnexal mass. Musculoskeletal: No acute or significant osseous findings. IMPRESSION: 1. No acute findings or explanation for the patient's symptoms. No evidence of complication from previous Y 90 therapy. 2. The multifocal hepatic metastatic disease has not substantially changed from the prior study of 10 weeks ago. 3. Mildly increased bibasilar atelectasis. 4. Stable postsurgical changes within the abdomen and moderate atherosclerosis. Electronically Signed   By: Richardean Sale M.D.   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 Michael Boston on 08/31/2014 when she was admitted with SBO. HER2 NEGATIVE and preserved expression of the major and minor MMR proteins, thus there is a very low probability that  microsatellite instability (MSI) is present.  Oncology history is updated.  Pre-treatment labs as ordered: CBC diff, CMET, CEA.  Will get a UA and blood cultures today for work-up of patient reported history.  HGB is 7.7 g/dL and therefore, she is offered a 1 unit PRBC.  She is agreeable to this and orders are placed for 1 unit of blood.  She notes that she did not tolerate Fentanyl as she developed nausea 30 minutes into placing her fentanyl patch and removed the patch 3 hours later.  She has not used Tramadol at home to date.  She is not interested in using Tramadol.  She notes that she is using Tylenol.  She is currently being treated for bacterial vaginosis by Dr. Nevada Crane (PCP) and colleagues.  She notes continued pruritis, but without discharge.  She is currently finishing Flagyl therapy.  PET scan is ordered in 4 week to evaluate treatment response to therapy, including liver targeted therapy.  Return in 4-5 weeks for follow-up, pre-treatment labs, and follow-up appointment.    THERAPY PLAN:  Continue with treatment as planned.    All questions were answered. The patient knows to call the clinic with any problems, questions or concerns. We can certainly see the patient much sooner if necessary.  Patient and plan discussed with Dr. Ancil Linsey and she is in agreement with the aforementioned.   This note is electronically signed by: Doy Mince 07/16/2015 2:27 PM

## 2015-07-16 ENCOUNTER — Encounter (HOSPITAL_BASED_OUTPATIENT_CLINIC_OR_DEPARTMENT_OTHER): Payer: Commercial Managed Care - HMO

## 2015-07-16 ENCOUNTER — Encounter (HOSPITAL_BASED_OUTPATIENT_CLINIC_OR_DEPARTMENT_OTHER): Payer: Commercial Managed Care - HMO | Admitting: Oncology

## 2015-07-16 DIAGNOSIS — C787 Secondary malignant neoplasm of liver and intrahepatic bile duct: Secondary | ICD-10-CM

## 2015-07-16 DIAGNOSIS — C189 Malignant neoplasm of colon, unspecified: Secondary | ICD-10-CM | POA: Diagnosis not present

## 2015-07-16 DIAGNOSIS — C19 Malignant neoplasm of rectosigmoid junction: Secondary | ICD-10-CM

## 2015-07-16 LAB — CBC WITH DIFFERENTIAL/PLATELET
BASOS PCT: 0 %
Basophils Absolute: 0 10*3/uL (ref 0.0–0.1)
Eosinophils Absolute: 0.1 10*3/uL (ref 0.0–0.7)
Eosinophils Relative: 1 %
HEMATOCRIT: 25.1 % — AB (ref 36.0–46.0)
Hemoglobin: 7.7 g/dL — ABNORMAL LOW (ref 12.0–15.0)
Lymphocytes Relative: 10 %
Lymphs Abs: 0.7 10*3/uL (ref 0.7–4.0)
MCH: 25.1 pg — ABNORMAL LOW (ref 26.0–34.0)
MCHC: 30.7 g/dL (ref 30.0–36.0)
MCV: 81.8 fL (ref 78.0–100.0)
MONO ABS: 1.2 10*3/uL — AB (ref 0.1–1.0)
MONOS PCT: 17 %
NEUTROS ABS: 5.2 10*3/uL (ref 1.7–7.7)
Neutrophils Relative %: 72 %
Platelets: 200 10*3/uL (ref 150–400)
RBC: 3.07 MIL/uL — ABNORMAL LOW (ref 3.87–5.11)
RDW: 17.5 % — ABNORMAL HIGH (ref 11.5–15.5)
WBC: 7.2 10*3/uL (ref 4.0–10.5)

## 2015-07-16 LAB — URINALYSIS, ROUTINE W REFLEX MICROSCOPIC
Bilirubin Urine: NEGATIVE
GLUCOSE, UA: NEGATIVE mg/dL
HGB URINE DIPSTICK: NEGATIVE
KETONES UR: NEGATIVE mg/dL
LEUKOCYTES UA: NEGATIVE
Nitrite: NEGATIVE
PH: 5.5 (ref 5.0–8.0)
PROTEIN: 30 mg/dL — AB
Specific Gravity, Urine: 1.025 (ref 1.005–1.030)

## 2015-07-16 LAB — COMPREHENSIVE METABOLIC PANEL
ALBUMIN: 2.9 g/dL — AB (ref 3.5–5.0)
ALK PHOS: 170 U/L — AB (ref 38–126)
ALT: 19 U/L (ref 14–54)
ANION GAP: 9 (ref 5–15)
AST: 29 U/L (ref 15–41)
BILIRUBIN TOTAL: 0.3 mg/dL (ref 0.3–1.2)
BUN: 19 mg/dL (ref 6–20)
CALCIUM: 8.3 mg/dL — AB (ref 8.9–10.3)
CO2: 25 mmol/L (ref 22–32)
CREATININE: 0.5 mg/dL (ref 0.44–1.00)
Chloride: 99 mmol/L — ABNORMAL LOW (ref 101–111)
GFR calc Af Amer: >60 mL/min (ref >60)
GFR calc non Af Amer: >60 mL/min (ref >60)
GLUCOSE: 82 mg/dL (ref 65–99)
Potassium: 4.1 mmol/L (ref 3.5–5.1)
SODIUM: 133 mmol/L — AB (ref 135–145)
TOTAL PROTEIN: 7 g/dL (ref 6.5–8.1)

## 2015-07-16 LAB — URINE MICROSCOPIC-ADD ON: WBC UA: NONE SEEN WBC/hpf (ref 0–5)

## 2015-07-16 LAB — PREPARE RBC (CROSSMATCH)

## 2015-07-16 MED ORDER — ACETAMINOPHEN 325 MG PO TABS
ORAL_TABLET | ORAL | Status: AC
  Filled 2015-07-16: qty 2

## 2015-07-16 MED ORDER — ACETAMINOPHEN 325 MG PO TABS
650.0000 mg | ORAL_TABLET | Freq: Once | ORAL | Status: AC
  Administered 2015-07-16: 650 mg via ORAL

## 2015-07-16 MED ORDER — HEPARIN SOD (PORK) LOCK FLUSH 100 UNIT/ML IV SOLN
500.0000 [IU] | Freq: Every day | INTRAVENOUS | Status: AC | PRN
  Administered 2015-07-16: 500 [IU]

## 2015-07-16 MED ORDER — SODIUM CHLORIDE 0.9% FLUSH: 10.0000 mL | INTRAVENOUS | Status: DC | PRN

## 2015-07-16 MED ORDER — SODIUM CHLORIDE 0.9 % IV SOLN
250.0000 mL | Freq: Once | INTRAVENOUS | Status: AC
  Administered 2015-07-16: 250 mL via INTRAVENOUS

## 2015-07-16 MED ORDER — DIPHENHYDRAMINE HCL 25 MG PO CAPS
ORAL_CAPSULE | ORAL | Status: AC
  Filled 2015-07-16: qty 1

## 2015-07-16 NOTE — Progress Notes (Signed)
Patient tolerated transfusion well.  VSS.  Patient to return in one week to retry treatment.  She and her husband are aware and have new schedule.

## 2015-07-17 LAB — TYPE AND SCREEN
ABO/RH(D): A POS
ANTIBODY SCREEN: NEGATIVE
UNIT DIVISION: 0

## 2015-07-17 LAB — CEA: CEA: 39.4 ng/mL — AB (ref 0.0–4.7)

## 2015-07-18 ENCOUNTER — Encounter (HOSPITAL_COMMUNITY): Payer: Self-pay

## 2015-07-18 ENCOUNTER — Encounter (HOSPITAL_COMMUNITY): Payer: Commercial Managed Care - HMO

## 2015-07-18 NOTE — Addendum Note (Signed)
Encounter addended by: Arnell Asal on: 07/18/2015  2:10 PM<BR>     Documentation filed: Charges VN

## 2015-07-20 ENCOUNTER — Telehealth (HOSPITAL_COMMUNITY): Payer: Self-pay | Admitting: *Deleted

## 2015-07-20 NOTE — Telephone Encounter (Signed)
Pt wants to reschedule her chemo due to fevers and not feeling good. Pt states that she can not do chemo on Monday. She states that she knows she needs to do chemo but can not do chemo. She states that she has had a fever of 101 for 1.5 weeks and has never had a fever for this long. Pt told she could take Ibuprofen and/or Tylenol for the fevers. Pt told she can go to the ER but she states that she doesn't need to go since the blood cultures didn't show anything. Her husband said that she sleeps all the time. She said that all she does is lay around. She said she feels horrible. She was tearful. She wished to reschedule the chemo to 07/30/15. I asked her to call me on Monday and let me know how she was doing. Pt was told that her fever was likely tumor fever per Dr. [name]. I did tell pt that her not coming for chemo on time could be a detriment to her health. She said she knew she needed chemo but could not come due to the way she felt.

## 2015-07-23 ENCOUNTER — Inpatient Hospital Stay (HOSPITAL_COMMUNITY): Payer: Commercial Managed Care - HMO

## 2015-07-23 LAB — CULTURE, BLOOD (ROUTINE X 2)
CULTURE: NO GROWTH
Culture: NO GROWTH

## 2015-07-25 ENCOUNTER — Encounter (HOSPITAL_COMMUNITY): Payer: Commercial Managed Care - HMO

## 2015-07-30 ENCOUNTER — Inpatient Hospital Stay (HOSPITAL_COMMUNITY): Payer: Commercial Managed Care - HMO

## 2015-08-01 ENCOUNTER — Encounter (HOSPITAL_COMMUNITY): Payer: Commercial Managed Care - HMO

## 2015-08-06 ENCOUNTER — Inpatient Hospital Stay (HOSPITAL_COMMUNITY): Payer: Commercial Managed Care - HMO

## 2015-08-08 ENCOUNTER — Encounter (HOSPITAL_COMMUNITY): Payer: Commercial Managed Care - HMO

## 2015-08-13 ENCOUNTER — Inpatient Hospital Stay (HOSPITAL_COMMUNITY): Payer: Commercial Managed Care - HMO

## 2015-08-14 ENCOUNTER — Ambulatory Visit (HOSPITAL_COMMUNITY)
Admission: RE | Admit: 2015-08-14 | Discharge: 2015-08-14 | Disposition: A | Discharge status: Home / Self Care | Payer: Commercial Managed Care - HMO | Source: Ambulatory Visit | Attending: Oncology | Admitting: Oncology

## 2015-08-14 DIAGNOSIS — C189 Malignant neoplasm of colon, unspecified: Secondary | ICD-10-CM | POA: Diagnosis present

## 2015-08-14 DIAGNOSIS — C787 Secondary malignant neoplasm of liver and intrahepatic bile duct: Secondary | ICD-10-CM | POA: Insufficient documentation

## 2015-08-14 LAB — GLUCOSE, CAPILLARY: GLUCOSE-CAPILLARY: 102 mg/dL — AB (ref 65–99)

## 2015-08-14 MED ORDER — FLUDEOXYGLUCOSE F - 18 (FDG) INJECTION
5.2000 | Freq: Once | INTRAVENOUS | Status: AC | PRN
Start: 2015-08-14 — End: 2015-08-14
  Administered 2015-08-14: 5.2 via INTRAVENOUS

## 2015-08-15 ENCOUNTER — Encounter (HOSPITAL_COMMUNITY): Payer: Commercial Managed Care - HMO

## 2015-08-20 ENCOUNTER — Encounter (HOSPITAL_COMMUNITY): Payer: Commercial Managed Care - HMO | Attending: Hematology & Oncology | Admitting: Hematology & Oncology

## 2015-08-20 ENCOUNTER — Encounter (HOSPITAL_COMMUNITY): Payer: Self-pay | Admitting: Hematology & Oncology

## 2015-08-20 ENCOUNTER — Telehealth (HOSPITAL_COMMUNITY): Payer: Self-pay | Admitting: Hematology & Oncology

## 2015-08-20 ENCOUNTER — Encounter (HOSPITAL_COMMUNITY): Payer: Commercial Managed Care - HMO

## 2015-08-20 VITALS — BP 103/57 | HR 84 | Temp 98.1°F | Wt 105.7 lb

## 2015-08-20 DIAGNOSIS — C189 Malignant neoplasm of colon, unspecified: Secondary | ICD-10-CM | POA: Diagnosis present

## 2015-08-20 DIAGNOSIS — C787 Secondary malignant neoplasm of liver and intrahepatic bile duct: Secondary | ICD-10-CM | POA: Diagnosis present

## 2015-08-20 DIAGNOSIS — C19 Malignant neoplasm of rectosigmoid junction: Secondary | ICD-10-CM | POA: Diagnosis not present

## 2015-08-20 DIAGNOSIS — E876 Hypokalemia: Secondary | ICD-10-CM | POA: Insufficient documentation

## 2015-08-20 DIAGNOSIS — R112 Nausea with vomiting, unspecified: Secondary | ICD-10-CM | POA: Diagnosis present

## 2015-08-20 LAB — CBC WITH DIFFERENTIAL/PLATELET
Basophils Absolute: 0 10*3/uL (ref 0.0–0.1)
Basophils Relative: 0 %
EOS PCT: 1 %
Eosinophils Absolute: 0.1 10*3/uL (ref 0.0–0.7)
HEMATOCRIT: 28.3 % — AB (ref 36.0–46.0)
Hemoglobin: 8.2 g/dL — ABNORMAL LOW (ref 12.0–15.0)
LYMPHS ABS: 1.1 10*3/uL (ref 0.7–4.0)
LYMPHS PCT: 9 %
MCH: 23.4 pg — AB (ref 26.0–34.0)
MCHC: 29 g/dL — ABNORMAL LOW (ref 30.0–36.0)
MCV: 80.6 fL (ref 78.0–100.0)
MONO ABS: 1.3 10*3/uL — AB (ref 0.1–1.0)
Monocytes Relative: 10 %
Neutro Abs: 9.9 10*3/uL — ABNORMAL HIGH (ref 1.7–7.7)
Neutrophils Relative %: 80 %
PLATELETS: 337 10*3/uL (ref 150–400)
RBC: 3.51 MIL/uL — AB (ref 3.87–5.11)
RDW: 19.9 % — AB (ref 11.5–15.5)
WBC: 12.4 10*3/uL — ABNORMAL HIGH (ref 4.0–10.5)

## 2015-08-20 LAB — COMPREHENSIVE METABOLIC PANEL
ALT: 19 U/L (ref 14–54)
AST: 37 U/L (ref 15–41)
Albumin: 2.7 g/dL — ABNORMAL LOW (ref 3.5–5.0)
Alkaline Phosphatase: 276 U/L — ABNORMAL HIGH (ref 38–126)
Anion gap: 12 (ref 5–15)
BILIRUBIN TOTAL: 0.6 mg/dL (ref 0.3–1.2)
BUN: 18 mg/dL (ref 6–20)
CHLORIDE: 94 mmol/L — AB (ref 101–111)
CO2: 25 mmol/L (ref 22–32)
CREATININE: 0.4 mg/dL — AB (ref 0.44–1.00)
Calcium: 8.6 mg/dL — ABNORMAL LOW (ref 8.9–10.3)
Glucose, Bld: 80 mg/dL (ref 65–99)
Potassium: 3.4 mmol/L — ABNORMAL LOW (ref 3.5–5.1)
Sodium: 131 mmol/L — ABNORMAL LOW (ref 135–145)
TOTAL PROTEIN: 7.8 g/dL (ref 6.5–8.1)

## 2015-08-20 MED ORDER — REGORAFENIB 40 MG PO TABS: 160.0000 mg | ORAL_TABLET | Freq: Every day | ORAL | Status: DC

## 2015-08-20 MED ORDER — HEPARIN SOD (PORK) LOCK FLUSH 100 UNIT/ML IV SOLN
INTRAVENOUS | Status: AC
  Filled 2015-08-20: qty 5

## 2015-08-20 NOTE — Progress Notes (Signed)
Please see doctors encounter for more information, no treatment today per doctors orders

## 2015-08-20 NOTE — Telephone Encounter (Signed)
SENT STIVARGA SCRIPT TO DIPLOMAT

## 2015-08-20 NOTE — Patient Instructions (Signed)
[place] at [facility] Discharge Instructions  RECOMMENDATIONS MADE BY THE CONSULTANT AND ANY TEST RESULTS WILL BE SENT TO YOUR REFERRING PHYSICIAN.  Exam and discussion today with Dr. [name]. Blood cultures x 2 drawn today (one from port and one from left arm) along with CBC/diff, CMET, and CEA. Stivarga prescription sent. Return to clinic once you start on Stivarga.  Thank you for choosing [place] at [facility] to provide your oncology and hematology care.  To afford each patient quality time with our provider, please arrive at least 15 minutes before your scheduled appointment time.   Beginning January 23rd 2017 lab work for the [facility] will be done in the  Main lab at [facility] on 1st floor. If you have a lab appointment with the [facility] please come in thru the  Main Entrance and check in at the main information desk  You need to re-schedule your appointment should you arrive 10 or more minutes late.  We strive to give you quality time with our providers, and arriving late affects you and other patients whose appointments are after yours.  Also, if you no show three or more times for appointments you may be dismissed from the clinic at the providers discretion.     Again, thank you for choosing [facility].  Our hope is that these requests will decrease the amount of time that you wait before being seen by our physicians.       _____________________________________________________________  Should you have questions after your visit to [facility], please contact our office at [phone] between the hours of 8:30 a.m. and 4:30 p.m.  Voicemails left after 4:30 p.m. will not be returned until the following business day.  For prescription refill requests, have your pharmacy contact our office.         Resources For Cancer Patients and their Caregivers ? American Cancer Society: Can assist  with transportation, wigs, general needs, runs Look Good Feel Better.        9721732309 ? Cancer Care: Provides financial assistance, online support groups, medication/co-pay assistance.  1-800-813-HOPE (901) 773-0055) ? [place] Assists [place] cancer patients and their families through emotional , educational and financial support.  801-779-4724 ? [place] DSS Where to apply for food stamps, Medicaid and utility assistance. (225) 226-1145 ? [facility]: Transportation to medical appointments. 878-471-3401 ? Social Security Administration: May apply for disability if have a Stage IV cancer. 775 735 4972 (279)172-0666 ? [employer], Disability and Transit Services: Assists with nutrition, care and transit needs. [place] Support Programs: @10RELATIVEDAYS @ > Cancer Support Group  2nd Tuesday of the month 1pm-2pm, Journey Room  > Creative Journey  3rd Tuesday of the month 1130am-1pm, Journey Room  > Look Good Feel Better  1st Wednesday of the month 10am-12 noon, Journey Room (Call [place] to register [phone])

## 2015-08-20 NOTE — Progress Notes (Signed)
Blood cultures x 2 drawn - one from port L chest, one from left AC.  Port flushed with NS 20 ml and Heparin flush 500 units.

## 2015-08-20 NOTE — Progress Notes (Signed)
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 Diffuse liver metastases   07/02/2014 Imaging Changes consistent with the known history of colon carcinoma with metastatic disease involving the liver and peritoneum. Recent stent placement within the colonic lesion. Some fecal material is noted within the proximal portion of the stent which a...   07/05/2014 Pathology Results Liver, needle/core biopsy, left lobe - METASTATIC ADENOCARCINOMA   07/05/2014 Pathology Results There is preserved expression of the major and minor MMR proteins. There is a very low probability that microsatellite instability (MSI) is present.   07/05/2014 Pathology Results HER 2 NEGATIVE   07/19/2014 - 08/16/2014 Chemotherapy FOLFOX   07/23/2014 Imaging Similar findings of advanced stage IV metastatic colon cancer with perhaps slight interval progression of disease in the liver.   08/25/2014 Treatment Plan Change Chemotherapy held due to hospitalization   08/25/2014 - 09/21/2014 Hospital Admission SBO   08/26/2014 Imaging High-grade distal small bowel obstruction, likely from peritoneal metastatic disease.  Extensive hepatic metastatic disease. The largest metastasis in the left lobe has decreased from 07/23/2014.   08/31/2014 Surgery Colon, segmental resection for tumor, rectum - INVASIVE ADENOCARCINOMA, MODERATELY DIFFERENTIATED - TUMOR INVADES THROUGH SEROSA, SEE COMMENT. - PERFORATION WITH VISIBLE STENT. - LYMPHOVASCULAR AND PERINEURAL INVASION IS PRESENT. - DISTAL AND PROX...   09/15/2014 Imaging No bowel obstruction suspected status post multifocal  small and large bowel resection with both primary reanastomoses and descending colostomy. Patulous appearance of colon in the right abdomen might reflect focal ileus.   11/06/2014 - 01/17/2015 Chemotherapy FOLFOX   11/28/2014 - 11/30/2014 Hospital Admission 1.   Nausea and vomiting 2.   Constipation    01/18/2015 - 01/22/2015 Hospital Admission Nausea and vomiting, UTI (lower urinary tract infection)   01/29/2015 Progression CT CAP- Progressive hepatic metastatic disease. New and enlarging heterogeneous nodules and masses in the liver measure up to 5.2 x 6.7 cm (image 56), previously 3.3 x 5.1 cm on 09/15/2014.   02/12/2015 -  Chemotherapy FOLFIRI, patient refuses Avastin.  5FU bolus deleted without administration beginning on cycle 1.   04/06/2015 Imaging CT C/A/P mixed response with decreased size of several hepatic lesions, increased size of others, dominant lesion in RL of liver 8.1x5.2x8.4cm, large serosal implant overlying RL of liver appears increased   05/31/2015 Procedure Arteriography and Y-90 embolization performed of replaced R hepatic artery off SMA. 26 mCI prescribed dose of SIR spheres   06/18/2015 Imaging CT abd/pelvis- No acute findings or explanation for the patient's symptoms. No evidence of complication from previous Y 90 therapy. The multifocal hepatic metastatic disease has not substantially changed from the prior study of 10 weeks ago.   08/14/2015 PET scan Multifocal hepatic metastases, with suspected progression in the L hepatic lobe. Trace R pelvic fluid, with associated hypermetabolism, suspicious for peritoneal disease, possibly new from CT. No findings suspicious for metastatic disease in the chest   08/14/2015 Progression PET scan concerning for progressive disease in liver and peritoneal disease    CURRENT THERAPY: FOLFIRI  INTERVAL HISTORY: DORIANA Morrison 60 y.o. 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 Michael Boston on 08/31/2014 when she was admitted with SBO.   Sydney Morrison is accompanied by her husband. I personally reviewed and discussed further treatment options including Stivarga and Lonsurf.  Reports fevers every day, all day and all night. These fevers began after the Y90 treatment. Her husband states the fevers are anywhere from 100 to 104 degrees Fahrenheit. She notes after every blood transfusion she has had she gets fevers, as well. The patient explains the fevers "sap me" as her husband reported she is only able to be up doing things for 3-4 hours a day. Mostly, she lays on the couch or in the bed.  Patient reports the day after chemotherapy she is unable to drink water as it comes back up. She is very concerned by this as she is losing a lot of fluids by sweating with the fevers. She is unable to take tylenol as she sweats for 8 hours straight after taking it.   She is able to eat some, noting she has to time her eating between the sweats, chills, teeth chattering, and feeling too hot to eat.   Reports yellow sputum production with cough about once daily. All other mucous and sputum production is clear in color.    Past Medical History  Diagnosis Date  . Crohn's disease ([place])   . Chronic headache   . Cancer ([place])   . Nausea and vomiting 08/25/2014  . Sinus infection   . PONV (postoperative nausea and vomiting)     with all sedation medications  . 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     is allergic to avelox; codeine; dextromethorphan; erythromycin; penicillins; percocet; reglan; zofran; clindamycin hcl; red dye; and suprep.  Current Outpatient Prescriptions on File Prior to Visit  Medication Sig Dispense Refill  . acetaminophen (TYLENOL) 325 MG tablet Take 325 mg by mouth every 6 (six) hours as needed for fever or headache (headahce).     . ALPRAZolam (XANAX) 0.5 MG tablet Take 0.125-0.5 mg by mouth at bedtime as needed for sleep.     Marland Kitchen aprepitant (EMEND) 40 MG capsule Take 40 mg by mouth daily.    . benzocaine (ZILACTIN-B) 10 % mucosal gel Use as directed 1 application in the mouth or throat as needed for mouth pain. Reported on 03/27/2015    . cromolyn (NASALCROM) 5.2 MG/ACT nasal spray Place 1 spray into both nostrils at bedtime.    . Cyanocobalamin (VITAMIN B-12 CR) 1500 MCG TBCR Take 1 tablet by mouth daily.    Marland Kitchen dexamethasone (DECADRON) 4 MG tablet Take 1 tablet (4 mg total) by mouth 2 (two) times daily. For 2 days. Begin day of pump disconnect. 8 tablet 2  . docusate calcium (SURFAK) 240 MG capsule Take 240 mg by mouth daily as needed for mild constipation.    . ENSURE (ENSURE) Take 237 mLs by mouth 2 (two) times daily with a meal. Reported on 07/10/2015    . fentaNYL (DURAGESIC) 25 MCG/HR patch Place 1 patch (25 mcg total) onto the skin every 3 (three) days. 5 patch 0  . Fluorouracil (  ADRUCIL IV) Inject into the vein every 14 (fourteen) days.    . GENERLAC 10 GM/15ML SOLN Take 15 mLs (10 g total) by mouth daily as needed (for constipation). 2700 mL 2  . irinotecan in dextrose 5 % 500 mL Inject into the vein once.    Marland Kitchen LEUCOVORIN CALCIUM IV Inject into the vein. Reported on 03/27/2015    . lidocaine-prilocaine (EMLA) cream Apply 1 application topically as needed. Apply to portacath site 1-2 hours prior to use 30 g 3  . loperamide (IMODIUM A-D) 2 MG tablet Take 2 mg by mouth 4 (four) times daily  as needed for diarrhea or loose stools. Reported on 05/08/2015    . Multiple Vitamins-Minerals (MULTIVITAMIN WITH MINERALS) tablet Take 1 tablet by mouth daily.    . nicotine (NICODERM CQ - DOSED IN MG/24 HR) 7 mg/24hr patch Place 7 mg onto the skin daily.    Marland Kitchen LORazepam (ATIVAN) 0.5 MG tablet Take 1 tablet (0.5 mg total) by mouth every 6 (six) hours as needed (nausea/vomiting). May take SL if concern for vomiting exists (Patient not taking: Reported on 07/10/2015) 45 tablet 2   Current Facility-Administered Medications on File Prior to Visit  Medication Dose Route Frequency Provider Last Rate Last Dose  . atropine injection 0.5 mg  0.5 mg Intravenous Once PRN Patrici Ranks, MD   0.5 mg at 02/12/15 1001  . heparin lock flush 100 unit/mL  500 Units Intracatheter Once PRN Patrici Ranks, MD      . promethazine (PHENERGAN) injection 12.5 mg  12.5 mg Intravenous Q4H PRN Patrici Ranks, MD   12.5 mg at 02/12/15 1000  . sodium chloride 0.9 % injection 10 mL  10 mL Intracatheter PRN Patrici Ranks, MD        Past Surgical History  Procedure Laterality Date  . Breast surgery    . Uterine fibroid surgery    . Colonoscopy w/ biopsies  06/29/2014    DR HUNG  . Flexible sigmoidoscopy N/A 06/30/2014    Procedure: FLEXIBLE SIGMOIDOSCOPY;  Surgeon: Carol Ada, MD;  Location: [facility] ENDOSCOPY;  Service: Endoscopy;  Laterality: N/A;  . Colonic stent placement N/A 06/30/2014    Procedure: COLONIC STENT PLACEMENT;  Surgeon: Carol Ada, MD;  Location: [facility] ENDOSCOPY;  Service: Endoscopy;  Laterality: N/A;  with fluro   . Portacath placement N/A 07/04/2014    Procedure: POWER PORT PLACEMENT;  Surgeon: Alphonsa Overall, MD;  Location: [place];  Service: General;  Laterality: N/A;  . Laparotomy N/A 08/31/2014    Procedure: EXPLORATORY LAPAROTOMY;  Surgeon: Michael Boston, MD;  Location: [facility];  Service: General;  Laterality: N/A;  . Bowel resection N/A 08/31/2014    Procedure: SMALL BOWEL RESECTION;  Surgeon: Michael Boston, MD;  Location: [facility];  Service: General;  Laterality: N/A;  . Colostomy N/A 08/31/2014    Procedure: low anterior resection with COLOSTOMY;  Surgeon: Michael Boston, MD;  Location: [facility];  Service: General;  Laterality: N/A;  . Appendectomy  08/31/2014    Procedure: APPENDECTOMY;  Surgeon: Michael Boston, MD;  Location: [facility];  Service: General;;  . Supracervical abdominal hysterectomy  08/31/2014    Procedure: HYSTERECTOMY SUPRACERVICAL ABDOMINAL BILATERAL TUBES AND OVARIES;  Surgeon: Michael Boston, MD;  Location: [facility];  Service: General;;  . Transverse colon resection  08/31/2014    Procedure: TRANSVERSE COLON RESECTION;  Surgeon: Michael Boston, MD;  Location: [facility];  Service: General;;  . Debulking  08/31/2014    Procedure: DEBULKING OF  PERITONEUM;  Surgeon: Michael Boston, MD;  Location: [facility];  Service: General;;    Denies any headaches, dizziness, double vision, nausea, constipation, chest pain, heart palpitations, shortness of breath, blood in stool, black tarry stool, urinary pain, urinary burning, urinary frequency, hematuria.  Positive for fever and chills. Fevers everyday, all day and all night. Positive for cough and sputum production. Once daily yellow sputum production with cough. 14 point review of systems was performed and is negative except as detailed under history of present illness and above   PHYSICAL EXAMINATION ECOG PERFORMANCE STATUS: 2 - Symptomatic, <50% confined to bed   Vitals with BMI 08/20/2015  Height   Weight 105 lbs 11 oz  BMI   Systolic 299  Diastolic 57  Pulse 84  Respirations     GENERAL:alert, no distress, cachectic, cooperative, smiling and accompanied by her husband, Jenny Reichmann.  SKIN: skin color, texture, turgor are normal HEAD: Normocephalic, No masses, lesions, tenderness or abnormalities EYES: normal, PERRLA, EOMI, Conjunctiva are pink and non-injected EARS: External ears normal OROPHARYNX:lips, buccal mucosa, and tongue normal and mucous  membranes are moist  NECK: supple, trachea midline LYMPH:  No palpable adenopathy in the neck, supraclavicular region or axillae BREAST:not examined LUNGS: clear to auscultation and percussion HEART: regular rate & rhythm, no murmurs, no gallops, S1 normal and S2 normal ABDOMEN:abdomen soft, non-tender, normal bowel sounds and ostomy noted.  Small stomal hernia. Very thin BACK: Back symmetric, no curvature, sacral area with mild erythema, no active skin breakdown EXTREMITIES:less then 2 second capillary refill, no joint deformities, effusion, or inflammation, no skin discoloration, no cyanosis  NEURO: alert & oriented x 3 with fluent speech, no focal motor/sensory deficits   LABORATORY DATA: I have reviewed the data below as listed.  Results for ZIYAN, HILLMER (MRN [id number]) as of 08/20/2015 08:54  Blood cultures drawn and pending    PATHOLOGY:  Diagnosis 1. Soft tissue mass, biopsy, peritoneal mass dome of bladder - METASTATIC ADENOCARCINOMA. 2. Appendix, Incidental - METASTATIC ADENOCARCINOMA. 3. Small intestine, resection, ileum - METASTATIC ADENOCARCINOMA. - RESECTION MARGINS ARE NEGATIVE. 4. Colon, segmental resection, with omentum, transverse - METASTATIC ADENOCARCINOMA INVOLVING COLON AND OMENTUM. - RESECTION MARGINS ARE NEGATIVE. 5. Soft tissue, biopsy, right pelvic sidewall nodule - METASTATIC ADENOCARCINOMA. 6. Soft tissue mass, biopsy, anterior peritoneum mass near dome - METASTATIC ADENOCARCINOMA. 7. Colon, segmental resection for tumor, rectum - INVASIVE ADENOCARCINOMA, MODERATELY DIFFERENTIATED - TUMOR INVADES THROUGH SEROSA, SEE COMMENT. - PERFORATION WITH VISIBLE STENT. - LYMPHOVASCULAR AND PERINEURAL INVASION IS PRESENT. - DISTAL AND PROXIMAL RESECTION MARGINS ARE NEGATIVE, SEE COMMENT FOR RADIAL MARGIN. - METASTATIC CARCINOMA IN FOUR OF FOURTEEN LYMPH NODES (4/14). - SEROSAL METASTATIC NODULES AND SATELLITE NODULES PRESENT. - SEE ONCOLOGY TABLE AND  COMMENT.    RADIOLOGY: I have personally reviewed the radiological images as listed and agreed with the findings in the report. Study Result     CLINICAL DATA: Subsequent treatment strategy for stage IV colorectal cancer. Status post Y 90 embolization for liver metastases.  EXAM: NUCLEAR MEDICINE PET SKULL BASE TO THIGH  TECHNIQUE: 5.2 mCi F-18 FDG was injected intravenously. Full-ring PET imaging was performed from the skull base to thigh after the radiotracer. CT data was obtained and used for attenuation correction and anatomic localization.  FASTING BLOOD GLUCOSE: Value: 104 mg/dl  COMPARISON: CT abdomen pelvis dated 06/18/2015.  FINDINGS: NECK  No hypermetabolic lymph nodes in the neck.  CHEST  No suspicious pulmonary nodules. No focal consolidation. No pleural effusion or pneumothorax.  Left  chest port terminates at the cavoatrial junction. The heart is normal in size. No hypermetabolic thoracic lymphadenopathy.  ABDOMEN/PELVIS  Widespread/multifocal hepatic metastases, poorly evaluated on the unenhanced CT portion, and therefore difficult to discretely compare with the prior study. However, progression in the left hepatic lobe is suspected. For example, there is a 2.4 cm lesion in the medial segment left hepatic lobe (series 3/image 131), previously measuring 12 mm.  On PET, the dominant metastasis in the right hepatic dome demonstrates max SUV 11.8. The dominant metastasis in the left hepatic lobe demonstrates max SUV 18.5. The dominant lesion inferiorly in the right hepatic lobe demonstrates max SUV 13.6.  No abnormal hypermetabolic activity within the pancreas, adrenal glands, or spleen.  Status post partial right hemicolectomy, with suture lines in the right mid abdomen (series 3/image 157). Additional suture line related to prior low anterior section with sigmoid colostomy.  Trace right pelvic fluid, with two areas of focal  hypermetabolism in the right lower quadrant (PET image 157 and 171), max SUV 8.5, worrisome for peritoneal disease.  No hypermetabolic lymph nodes in the abdomen or pelvis.  SKELETON  No focal hypermetabolic activity to suggest skeletal metastasis.  IMPRESSION: Multifocal hepatic metastases, with suspected progression in the left hepatic lobe.  Trace right pelvic fluid, with associated hypermetabolism, suspicious for peritoneal disease, possibly new from CT.  No findings suspicious for metastatic disease in the chest.   Electronically Signed  By: Julian Hy M.D.  On: 08/14/2015 15:03     ASSESSMENT AND PLAN:  Stage IV CRC Cachexia Sacral Decubitus, healed Anemia, multifactorial Kras mutation Y-90 embolization RL liver MSI-STABLE KRAS-MUTATED   I personally reviewed and discussed further treatment options including Stivarga and Lonsurf. I also reviewed the NCCN guidelines with the patient and her husband. She has missed multiple chemotherapy appointments after her embolization, PET is suspicious for progression of her peritoneal disease and I feel this is likely, she had extensive disease at diagnosis.   The patient complains of constant fevers ranging from 100 to 104 degrees Fahrenheit. These fevers are consistent with tumor fever. She is unfortunately VERY reluctant to discuss hospice and end of life issues. [place] at this point needs hospice. I have discussed this with her many times.   She would like to proceed with Stivarga treatment. I will write a prescription for Stivarga.  Blood cultures will be performed again. I tried to reassure the patient I do not feel her fever is infectious.   She will return for follow up when she receives the [place] with blood work and physical exam.   She did not want an antibiotic for her pulmonary complaints as she notes her sputum is clear most of the time.  All questions were answered. The patient knows to call  the clinic with any problems, questions or concerns. We can certainly see the patient much sooner if necessary.   This document serves as a record of services personally performed by Ancil Linsey, MD. It was created on her behalf by Arlyce Harman, a trained medical scribe. The creation of this record is based on the scribe's personal observations and the provider's statements to them. This document has been checked and approved by the attending provider.  I have reviewed the above documentation for accuracy and completeness, and I agree with the above.  This note was electronically signed.  Kelby Fam. [name], MD

## 2015-08-21 LAB — CEA: CEA: 51.4 ng/mL — AB (ref 0.0–4.7)

## 2015-08-22 ENCOUNTER — Encounter (HOSPITAL_COMMUNITY): Payer: Commercial Managed Care - HMO

## 2015-08-23 ENCOUNTER — Telehealth (HOSPITAL_COMMUNITY): Payer: Self-pay | Admitting: Hematology & Oncology

## 2015-08-23 NOTE — Telephone Encounter (Signed)
PC to check status of Stivarga script. Per Pharmacist we needed to verify script before it can be sent to pt. After review it was determined that verification was not necessary and they will be contacting pt To set up delivery

## 2015-08-25 LAB — CULTURE, BLOOD (ROUTINE X 2)
CULTURE: NO GROWTH
Culture: NO GROWTH

## 2015-08-28 ENCOUNTER — Telehealth (HOSPITAL_COMMUNITY): Payer: Self-pay | Admitting: *Deleted

## 2015-08-28 NOTE — Telephone Encounter (Signed)
I went over side effects of drug and how to take drugs with patient's husband. He knows to call if there are any problems. They return to see Dr. [name] on Monday June 5.

## 2015-09-03 ENCOUNTER — Ambulatory Visit (HOSPITAL_COMMUNITY): Payer: Commercial Managed Care - HMO | Admitting: Hematology & Oncology

## 2015-09-03 ENCOUNTER — Other Ambulatory Visit (HOSPITAL_COMMUNITY): Payer: Commercial Managed Care - HMO

## 2015-09-03 NOTE — Progress Notes (Signed)
This encounter was created in error - please disregard.

## 2015-09-19 ENCOUNTER — Encounter (HOSPITAL_COMMUNITY): Payer: Self-pay

## 2015-09-19 ENCOUNTER — Other Ambulatory Visit (HOSPITAL_COMMUNITY): Payer: Self-pay | Admitting: Oncology

## 2015-09-19 DIAGNOSIS — C189 Malignant neoplasm of colon, unspecified: Secondary | ICD-10-CM

## 2015-09-19 DIAGNOSIS — C787 Secondary malignant neoplasm of liver and intrahepatic bile duct: Principal | ICD-10-CM

## 2015-09-19 MED ORDER — MORPHINE SULFATE (CONCENTRATE) 20 MG/ML PO SOLN: 5.0000 mg | ORAL | Status: DC | PRN

## 2015-09-19 NOTE — Progress Notes (Signed)
Hospice nurse called regarding Sydney Morrison, the coverage they have does not cover equipment and supplies. Patient was asking about getting fluids for hydration. Pain med for extreme and pain. Also wanted something for anxiety and nausea.  Consulted Tom Kefalas PA-C [employer] faxed to [employer]. She has xanax for anxiety and nausea-hospice nurse was going to inforce this. Informed Hospice nurse to call if she needed anything else.

## 2015-09-24 ENCOUNTER — Telehealth (HOSPITAL_COMMUNITY): Payer: Self-pay

## 2015-09-24 NOTE — Telephone Encounter (Signed)
-----   Message from Louis Meckel sent at 09/24/2015  9:52 AM EDT ----- Regarding: questions Patients husband called and has questions he needs to ask a nurse about what is going on with her.  Thanks

## 2015-09-24 NOTE — Telephone Encounter (Signed)
Husband requesting Order for [place] Nurse for fluids. Kirby Crigler PA-C advised and will send orders acordingly. Will fax orders to [place] and call Husband back to let him know what was done.

## 2015-09-27 ENCOUNTER — Other Ambulatory Visit (HOSPITAL_COMMUNITY): Payer: Self-pay | Admitting: Oncology

## 2015-09-27 DIAGNOSIS — C787 Secondary malignant neoplasm of liver and intrahepatic bile duct: Principal | ICD-10-CM

## 2015-09-27 DIAGNOSIS — C189 Malignant neoplasm of colon, unspecified: Secondary | ICD-10-CM

## 2015-09-27 MED ORDER — BUPRENORPHINE 5 MCG/HR TD PTWK: 5.0000 ug | MEDICATED_PATCH | TRANSDERMAL | Status: AC

## 2015-10-05 ENCOUNTER — Other Ambulatory Visit (HOSPITAL_COMMUNITY): Payer: Self-pay | Admitting: Hematology & Oncology

## 2015-10-05 DIAGNOSIS — C189 Malignant neoplasm of colon, unspecified: Secondary | ICD-10-CM

## 2015-10-05 DIAGNOSIS — C787 Secondary malignant neoplasm of liver and intrahepatic bile duct: Principal | ICD-10-CM

## 2015-10-05 MED ORDER — MORPHINE SULFATE (CONCENTRATE) 20 MG/ML PO SOLN: 10.0000 mg | ORAL | Status: AC | PRN

## 2015-10-07 ENCOUNTER — Emergency Department (HOSPITAL_COMMUNITY)
Admission: EM | Admit: 2015-10-07 | Discharge: 2015-10-08 | Disposition: A | Discharge status: Home / Self Care | Payer: Commercial Managed Care - HMO | Attending: Emergency Medicine | Admitting: Emergency Medicine

## 2015-10-07 ENCOUNTER — Encounter (HOSPITAL_COMMUNITY): Payer: Self-pay

## 2015-10-07 ENCOUNTER — Emergency Department (HOSPITAL_COMMUNITY): Payer: Commercial Managed Care - HMO

## 2015-10-07 DIAGNOSIS — Z79891 Long term (current) use of opiate analgesic: Secondary | ICD-10-CM | POA: Diagnosis not present

## 2015-10-07 DIAGNOSIS — R509 Fever, unspecified: Secondary | ICD-10-CM | POA: Diagnosis not present

## 2015-10-07 DIAGNOSIS — R1084 Generalized abdominal pain: Secondary | ICD-10-CM | POA: Insufficient documentation

## 2015-10-07 DIAGNOSIS — Z87891 Personal history of nicotine dependence: Secondary | ICD-10-CM | POA: Diagnosis not present

## 2015-10-07 DIAGNOSIS — Z859 Personal history of malignant neoplasm, unspecified: Secondary | ICD-10-CM | POA: Insufficient documentation

## 2015-10-07 DIAGNOSIS — E860 Dehydration: Secondary | ICD-10-CM | POA: Diagnosis not present

## 2015-10-07 DIAGNOSIS — R112 Nausea with vomiting, unspecified: Secondary | ICD-10-CM | POA: Insufficient documentation

## 2015-10-07 DIAGNOSIS — Z79899 Other long term (current) drug therapy: Secondary | ICD-10-CM | POA: Diagnosis not present

## 2015-10-07 DIAGNOSIS — R1032 Left lower quadrant pain: Secondary | ICD-10-CM | POA: Diagnosis present

## 2015-10-07 NOTE — ED Provider Notes (Signed)
CSN: [id number]     Arrival date & time 10/07/15  2221 History  By signing my name below, I, Sydney Morrison, attest that this documentation has been prepared under the direction and in the presence of Sydney Porter, MD at 00:20 AM . Electronically Signed: Higinio Morrison, Scribe. 10/08/2015. 12:37 AM.   Chief Complaint  Patient presents with  . Abdominal Pain   The history is provided by the patient and the spouse. No language interpreter was used.   HPI Comments: Sydney Morrison is a 60 y.o. female with PMHx of Chron's disease and colon cancer with mets to her liver (stopped chemotherapy earlier this spring, who presents to the Emergency Department complaining of gradually worsening, left, lower side and back pain that worsened today. Pt notes she has an ostomy and it has not been filling up. Pt notes she emptied her ostomy 4 days ago; per husband, pt has not had very much output. Pt states she has not been eating at all and has only been drinking ensure. Pt notes associated fever (TMAX 103 daily from tumor fever for months), nausea, and 1 episode of vomiting today. Pt's husband states he gave pt gentlax , adivan and 0.2 mL morphine today with no relief; pt's husband states pt vomited after being given gentlax this morning. Pt's husband states her abdomen has been "getting larger" in the past 1.5 weeks. She reports she received fluids from [place] 6 days ago; she notes she believes the fluids may be causing her distended abdomen. Per husband, pt stopped chemo treatments in March.   Oncologist Dr. [name]   Past Medical History  Diagnosis Date  . Crohn's disease ([place])   . Chronic headache   . Cancer ([place])   . Nausea and vomiting 08/25/2014  . Sinus infection   . PONV (postoperative nausea and vomiting)     with all sedation medications  . Noncompliance 06/24/2015   Past Surgical History  Procedure Laterality Date  . Breast surgery    . Uterine fibroid surgery    . Colonoscopy w/ biopsies   06/29/2014    DR HUNG  . Flexible sigmoidoscopy N/A 06/30/2014    Procedure: FLEXIBLE SIGMOIDOSCOPY;  Surgeon: Carol Ada, MD;  Location: [facility] ENDOSCOPY;  Service: Endoscopy;  Laterality: N/A;  . Colonic stent placement N/A 06/30/2014    Procedure: COLONIC STENT PLACEMENT;  Surgeon: Carol Ada, MD;  Location: [facility] ENDOSCOPY;  Service: Endoscopy;  Laterality: N/A;  with fluro   . Portacath placement N/A 07/04/2014    Procedure: POWER PORT PLACEMENT;  Surgeon: Alphonsa Overall, MD;  Location: [facility];  Service: General;  Laterality: N/A;  . Laparotomy N/A 08/31/2014    Procedure: EXPLORATORY LAPAROTOMY;  Surgeon: Michael Boston, MD;  Location: [facility] ORS;  Service: General;  Laterality: N/A;  . Bowel resection N/A 08/31/2014    Procedure: SMALL BOWEL RESECTION;  Surgeon: Michael Boston, MD;  Location: [facility];  Service: General;  Laterality: N/A;  . Colostomy N/A 08/31/2014    Procedure: low anterior resection with COLOSTOMY;  Surgeon: Michael Boston, MD;  Location: [facility];  Service: General;  Laterality: N/A;  . Appendectomy  08/31/2014    Procedure: APPENDECTOMY;  Surgeon: Michael Boston, MD;  Location: [facility];  Service: General;;  . Supracervical abdominal hysterectomy  08/31/2014    Procedure: HYSTERECTOMY SUPRACERVICAL ABDOMINAL BILATERAL TUBES AND OVARIES;  Surgeon: Michael Boston, MD;  Location: [facility];  Service: General;;  . Transverse colon resection  08/31/2014    Procedure: TRANSVERSE COLON  RESECTION;  Surgeon: Michael Boston, MD;  Location: [facility];  Service: General;;  . Debulking  08/31/2014    Procedure: DEBULKING OF PERITONEUM;  Surgeon: Michael Boston, MD;  Location: [facility];  Service: General;;   Family History  Problem Relation Age of Onset  . Hypertension Mother   . Cancer Father    Social History  Substance Use Topics  . Smoking status: Former Smoker -- 0.50 packs/day for 40 years    Types: Cigarettes    Quit date: 06/25/2014  . Smokeless tobacco: Never Used  . Alcohol Use: No   Lives at home Lives with  spouse  OB History    Gravida Para Term Preterm AB TAB SAB Ectopic Multiple Living   3 2 2  1  1         Review of Systems  Constitutional: Positive for fever.  Gastrointestinal: Positive for nausea, vomiting and abdominal pain.  Musculoskeletal: Positive for back pain.  All other systems reviewed and are negative.  Allergies  Avelox; Codeine; Dextromethorphan; Erythromycin; Penicillins; Percocet; Reglan; Zofran; Clindamycin hcl; Red dye; and Suprep  Home Medications   Prior to Admission medications   Medication Sig Start Date End Date Taking? Authorizing Provider  acetaminophen (TYLENOL) 325 MG tablet Take 325 mg by mouth every 6 (six) hours as needed for fever or headache (headahce).    Yes Historical Provider, MD  ALPRAZolam Duanne Moron) 0.5 MG tablet Take 0.125-0.5 mg by mouth at bedtime as needed for sleep.  09/17/14  Yes Historical Provider, MD  cromolyn (NASALCROM) 5.2 MG/ACT nasal spray Place 1 spray into both nostrils at bedtime.   Yes Historical Provider, MD  docusate calcium (SURFAK) 240 MG capsule Take 240 mg by mouth daily as needed for mild constipation.   Yes Historical Provider, MD  ENSURE (ENSURE) Take 237 mLs by mouth 2 (two) times daily with a meal. Reported on 07/10/2015   Yes Historical Provider, MD  GENERLAC 10 GM/15ML SOLN Take 15 mLs (10 g total) by mouth daily as needed (for constipation). 03/29/15  Yes Manon Hilding Kefalas, PA-C  LORazepam (ATIVAN) 0.5 MG tablet Take 1 tablet (0.5 mg total) by mouth every 6 (six) hours as needed (nausea/vomiting). May take SL if concern for vomiting exists 03/23/15  Yes Baird Cancer, PA-C  morphine (ROXANOL) 20 MG/ML concentrated solution Take 0.5 mLs (10 mg total) by mouth every 2 (two) hours as needed for severe pain, breakthrough pain, anxiety or shortness of breath. 10/05/15  Yes Patrici Ranks, MD  buprenorphine (BUTRANS - DOSED MCG/HR) 5 MCG/HR PTWK patch Place 1 patch (5 mcg total) onto the skin once a week. Patient not taking:  Reported on 10/07/2015 09/27/15   Manon Hilding Kefalas, PA-C   BP 150/69 mmHg  Pulse 117  Temp(Src) 98.4 F (36.9 C) (Oral)  Resp 20  Ht 5\' 7"  (1.702 m)  Wt 113 lb 8 oz (51.483 kg)  BMI 17.77 kg/m2  SpO2 96% Physical Exam  Constitutional: She is oriented to person, place, and time. She appears well-developed and well-nourished.  Non-toxic appearance. She does not appear ill. No distress.  Thin, emaciated female  HENT:  Head: Normocephalic and atraumatic.  Right Ear: External ear normal.  Left Ear: External ear normal.  Nose: Nose normal. No mucosal edema or rhinorrhea.  Mouth/Throat: Mucous membranes are normal. No dental abscesses or uvula swelling.  Eyes: Conjunctivae and EOM are normal. Pupils are equal, round, and reactive to light.  Neck: Normal range of motion and full passive range  of motion without pain. Neck supple.  Cardiovascular: Normal rate, regular rhythm and normal heart sounds.  Exam reveals no gallop and no friction rub.   No murmur heard. Pulmonary/Chest: Effort normal and breath sounds normal. No respiratory distress. She has no wheezes. She has no rhonchi. She has no rales. She exhibits no tenderness and no crepitus.  Abdominal: Soft. Normal appearance and bowel sounds are normal. She exhibits distension. There is tenderness. There is no rebound and no guarding.  Distended but soft with colostomy in LLQ   Musculoskeletal: Normal range of motion. She exhibits no edema or tenderness.  Moves all extremities well.   Neurological: She is alert and oriented to person, place, and time. She has normal strength. No cranial nerve deficit.  Skin: Skin is warm, dry and intact. No rash noted. No erythema. No pallor.  Psychiatric: She has a normal mood and affect. Her speech is normal and behavior is normal. Her mood appears not anxious.  Nursing note and vitals reviewed.   ED Course  Procedures  Medications  sodium chloride 0.9 % bolus 1,000 mL (0 mLs Intravenous Stopped 10/08/15  0224)  morphine 4 MG/ML injection 4 mg (4 mg Intravenous Given 10/08/15 0037)  sodium chloride 0.9 % bolus 1,000 mL (1,000 mLs Intravenous New Bag/Given 10/08/15 0243)  morphine 4 MG/ML injection 4 mg (4 mg Intravenous Given 10/08/15 0303)     DIAGNOSTIC STUDIES:  Oxygen Saturation is 96% on RA, normal by my interpretation.    COORDINATION OF CARE:  12:30 AM Discussed treatment Morrison, which includes 1L fluid, morphine, and blood work with pt at bedside and pt agreed to Morrison.Patient states I need to look at her chart she cannot take any nausea medications. She states she takes Ativan for nausea. She also states MiraLAX not work for her and the only thing that works for her is the gentle lax.  Recheck at 2:35 AM patient has received her IV fluids. We discussed her blood work results. She states her home health nurse will be coming out in 2 days to give her IV fluids. We decided to give another liter of fluids while she has the IV here. Shortly afterwards she requested more pain medication which was given to her.  Recheck at 4:40 AM patient has received her second liter of fluid. She states her abdomen is feeling better. Her abdomen is palpated and is soft. It does not appear to be more distended. Patient was discharged home. She has home health that comes to her house already.  Labs Review Results for orders placed or performed during the hospital encounter of 10/07/15  Comprehensive metabolic panel  Result Value Ref Range   Sodium 128 (L) 135 - 145 mmol/L   Potassium 3.0 (L) 3.5 - 5.1 mmol/L   Chloride 92 (L) 101 - 111 mmol/L   CO2 23 22 - 32 mmol/L   Glucose, Bld 93 65 - 99 mg/dL   BUN 17 6 - 20 mg/dL   Creatinine, Ser 0.49 0.44 - 1.00 mg/dL   Calcium 8.1 (L) 8.9 - 10.3 mg/dL   Total Protein 6.9 6.5 - 8.1 g/dL   Albumin 2.3 (L) 3.5 - 5.0 g/dL   AST 75 (H) 15 - 41 U/L   ALT 21 14 - 54 U/L   Alkaline Phosphatase 415 (H) 38 - 126 U/L   Total Bilirubin 4.6 (H) 0.3 - 1.2 mg/dL   GFR calc  non Af Amer >60 >60 mL/min   GFR calc Af Amer >60 >  60 mL/min   Anion gap 13 5 - 15  CBC with Differential  Result Value Ref Range   WBC 22.8 (H) 4.0 - 10.5 K/uL   RBC 3.36 (L) 3.87 - 5.11 MIL/uL   Hemoglobin 9.3 (L) 12.0 - 15.0 g/dL   HCT 29.7 (L) 36.0 - 46.0 %   MCV 88.4 78.0 - 100.0 fL   MCH 27.7 26.0 - 34.0 pg   MCHC 31.3 30.0 - 36.0 g/dL   RDW 22.5 (H) 11.5 - 15.5 %   Platelets 176 150 - 400 K/uL   Neutrophils Relative % 87 %   Neutro Abs 19.8 (H) 1.7 - 7.7 K/uL   Lymphocytes Relative 6 %   Lymphs Abs 1.3 0.7 - 4.0 K/uL   Monocytes Relative 7 %   Monocytes Absolute 1.6 (H) 0.1 - 1.0 K/uL   Eosinophils Relative 0 %   Eosinophils Absolute 0.1 0.0 - 0.7 K/uL   Basophils Relative 0 %   Basophils Absolute 0.1 0.0 - 0.1 K/uL    Laboratory interpretation all normal except Stable anemia, persistent elevation of alkaline phosphatase in new elevation of total bilirubin and SGOT, hyponatremia, and low chloride consistent with dehydration, hypokalemia    Imaging Review Dg Abd 2 Views  10/08/2015  CLINICAL DATA:  61 year old female with history of Crohn's disease. No output is seen from the ostomy. EXAM: ABDOMEN - 2 VIEW COMPARISON:  CT of the abdomen pelvis FINDINGS: There is a left lower quadrant ostomy. Moderate amount of stool noted within the bowel. There is no bowel dilatation or evidence of obstruction. Suture material noted in the right lower quadrant. No free air identified. There is hepatomegaly with mass effect and displacement of the bowel inferiorly. There is elevation of the right hemidiaphragm. A small right pleural effusion or right lung base atelectasis/ infiltrate is not excluded. Clinical correlation is recommended. IMPRESSION: Moderate stool within the bowel.  No evidence of obstruction. Hepatomegaly Electronically Signed   By: Anner Crete M.D.   On: 10/08/2015 00:20   I have personally reviewed and evaluated these images and lab results as part of my medical  decision-making.    MDM   Final diagnoses:  Generalized abdominal pain  Nausea and vomiting, vomiting of unspecified type  Dehydration   Morrison discharge  Sydney Porter, MD, FACEP   I personally performed the services described in this documentation, which was scribed in my presence. The recorded information has been reviewed and considered.  Sydney Porter, MD, Barbette Or, MD 10/08/15 [id number]

## 2015-10-07 NOTE — ED Notes (Signed)
I have an ostomy and I do not have any output.  I used stool softer without relief.  Talked to home health care and they said to come here.  Started vomiting today.  Has not been eating, been drinking ensure.

## 2015-10-08 LAB — COMPREHENSIVE METABOLIC PANEL
ALBUMIN: 2.3 g/dL — AB (ref 3.5–5.0)
ALT: 21 U/L (ref 14–54)
AST: 75 U/L — AB (ref 15–41)
Alkaline Phosphatase: 415 U/L — ABNORMAL HIGH (ref 38–126)
Anion gap: 13 (ref 5–15)
BUN: 17 mg/dL (ref 6–20)
CHLORIDE: 92 mmol/L — AB (ref 101–111)
CO2: 23 mmol/L (ref 22–32)
CREATININE: 0.49 mg/dL (ref 0.44–1.00)
Calcium: 8.1 mg/dL — ABNORMAL LOW (ref 8.9–10.3)
GFR calc Af Amer: >60 mL/min (ref >60)
GLUCOSE: 93 mg/dL (ref 65–99)
POTASSIUM: 3 mmol/L — AB (ref 3.5–5.1)
SODIUM: 128 mmol/L — AB (ref 135–145)
Total Bilirubin: 4.6 mg/dL — ABNORMAL HIGH (ref 0.3–1.2)
Total Protein: 6.9 g/dL (ref 6.5–8.1)

## 2015-10-08 LAB — CBC WITH DIFFERENTIAL/PLATELET
Basophils Absolute: 0.1 10*3/uL (ref 0.0–0.1)
Basophils Relative: 0 %
EOS ABS: 0.1 10*3/uL (ref 0.0–0.7)
EOS PCT: 0 %
HCT: 29.7 % — ABNORMAL LOW (ref 36.0–46.0)
Hemoglobin: 9.3 g/dL — ABNORMAL LOW (ref 12.0–15.0)
LYMPHS ABS: 1.3 10*3/uL (ref 0.7–4.0)
LYMPHS PCT: 6 %
MCH: 27.7 pg (ref 26.0–34.0)
MCHC: 31.3 g/dL (ref 30.0–36.0)
MCV: 88.4 fL (ref 78.0–100.0)
MONOS PCT: 7 %
Monocytes Absolute: 1.6 10*3/uL — ABNORMAL HIGH (ref 0.1–1.0)
Neutro Abs: 19.8 10*3/uL — ABNORMAL HIGH (ref 1.7–7.7)
Neutrophils Relative %: 87 %
PLATELETS: 176 10*3/uL (ref 150–400)
RBC: 3.36 MIL/uL — AB (ref 3.87–5.11)
RDW: 22.5 % — ABNORMAL HIGH (ref 11.5–15.5)
WBC: 22.8 10*3/uL — AB (ref 4.0–10.5)

## 2015-10-08 MED ORDER — SODIUM CHLORIDE 0.9 % IV BOLUS (SEPSIS)
1000.0000 mL | Freq: Once | INTRAVENOUS | Status: AC
  Administered 2015-10-08: 1000 mL via INTRAVENOUS

## 2015-10-08 MED ORDER — MORPHINE SULFATE (PF) 4 MG/ML IV SOLN
4.0000 mg | Freq: Once | INTRAVENOUS | Status: AC
  Administered 2015-10-08: 4 mg via INTRAVENOUS

## 2015-10-08 MED ORDER — MORPHINE SULFATE (PF) 4 MG/ML IV SOLN
4.0000 mg | Freq: Once | INTRAVENOUS | Status: AC
  Administered 2015-10-08: 4 mg via INTRAVENOUS
  Filled 2015-10-08: qty 1

## 2015-10-08 MED ORDER — MORPHINE SULFATE (PF) 4 MG/ML IV SOLN
INTRAVENOUS | Status: AC
  Filled 2015-10-08: qty 1

## 2015-10-08 NOTE — Discharge Instructions (Signed)
Recheck as needed, use your ativan for nausea as needed.

## 2015-10-30 DEATH — deceased

## 2016-06-18 IMAGING — NM NM LIVER IMG SPECT
3 series · 18 of 18 positions shown · non-contrast
Comparison: 04/06/2015.

CLINICAL DATA: Metastatic colon cancer

EXAM:
NUCLEAR MEDICINE LIVER SCAN; ULTRASOUND MISCELLANEOUS SOFT TISSUE
TECHNIQUE: Abdominal images were obtained in multiple projections after
intrahepatic arterial injection of radiopharmaceutical. SPECT
imaging was performed. Lung shunt calculation was performed.
RADIOPHARMACEUTICALS:  [id number] REMI ASIEDU TECHNETIUM TO 99M ALBUMIN
AGGREGATED

[Series 1: spect - (id) _(id)_tra · 4.1mm · 4.14mm/px · 6 of 128 frames shown]
[frame 11/128]
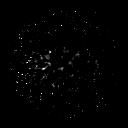
[frame 32/128]
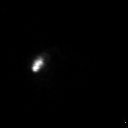
[frame 54/128]
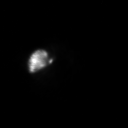
[frame 75/128]
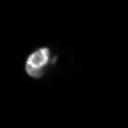
[frame 96/128]
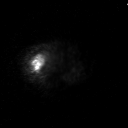
[frame 118/128]
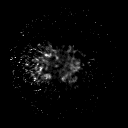

[Series 1: spect - (id) _(id)_cor · 4.1mm · 4.14mm/px · 6 of 128 frames shown]
[frame 11/128]
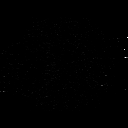
[frame 32/128]
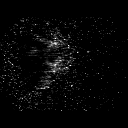
[frame 54/128]
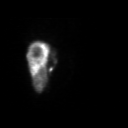
[frame 75/128]
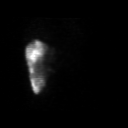
[frame 96/128]
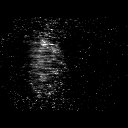
[frame 118/128]
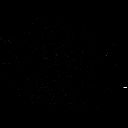

[Series 3: maa spect · 4.14mm/px · 6 of 64 frames shown]
[frame 6/64]
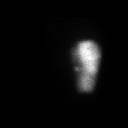
[frame 16/64]
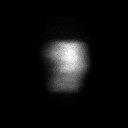
[frame 27/64]
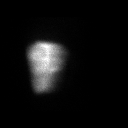
[frame 38/64]
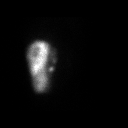
[frame 48/64]
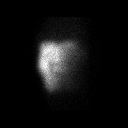
[frame 59/64]
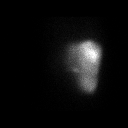

[18 of 18 positions shown; findings below may reference images not displayed]

FINDINGS: The injected microaggregated albumin localizes within the liver. No
evidence of activity within the stomach, duodenum, or bowel.

Calculated shunt fraction to the lungs equals 4%.
IMPRESSION: 1. No significant extrahepatic radiotracer activity following
intrahepatic arterial injection of MAA.
2. Lung shunt fraction equals 4%

## 2016-07-30 IMAGING — DX DG CHEST 2V
2 series · 2 of 2 positions shown · non-contrast
Comparison: Restaging chest abdomen and pelvis CT 04/06/2015. Chest
radiographs 02/18/2015.

CLINICAL DATA: 59 year old female with colorectal carcinoma
metastatic to the liver. Recently status post [AGE] microsphere
treatment. Recent symptoms of cough, shortness of breath, sinus
infection. Initial encounter.

EXAM:
CHEST  2 VIEW

[chest pa]
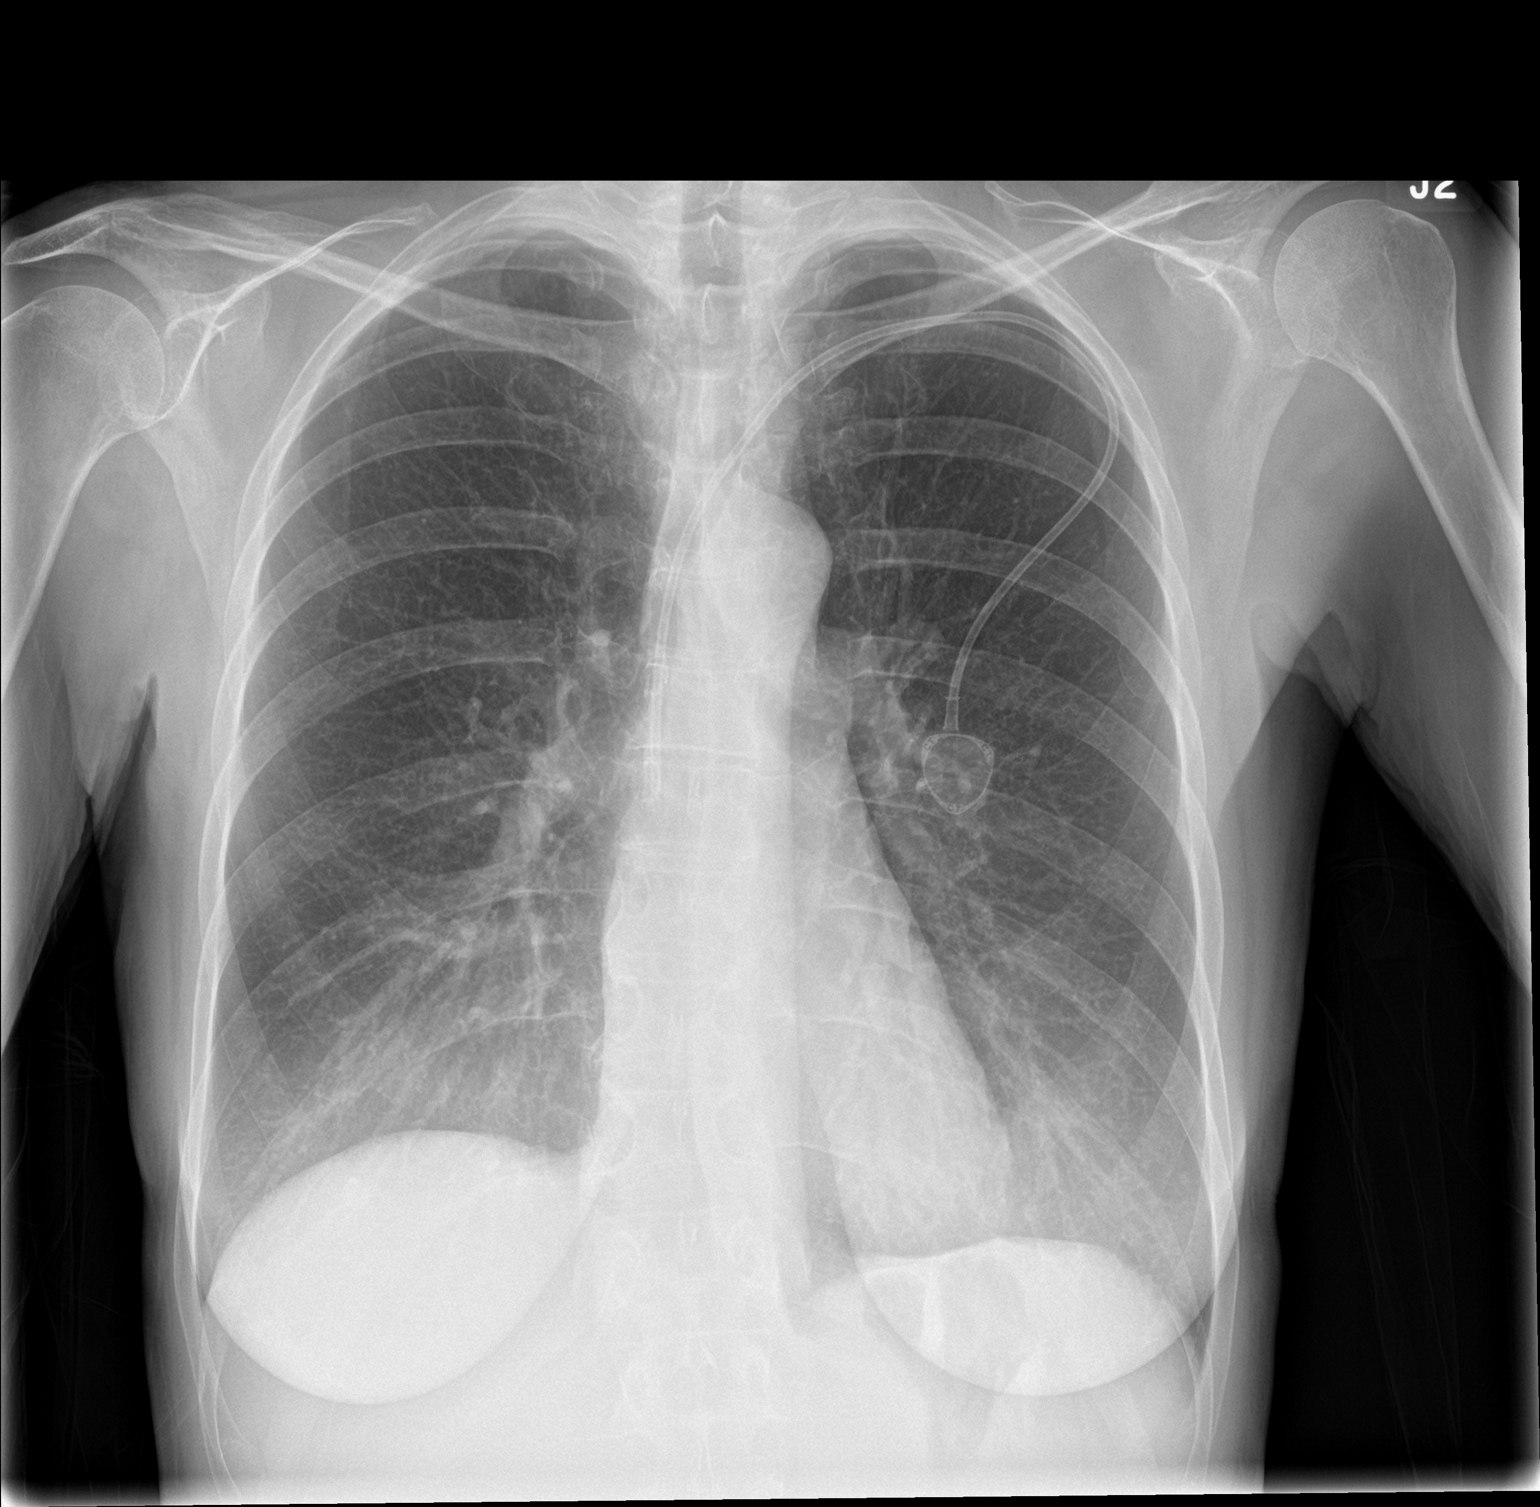

[chest lat]
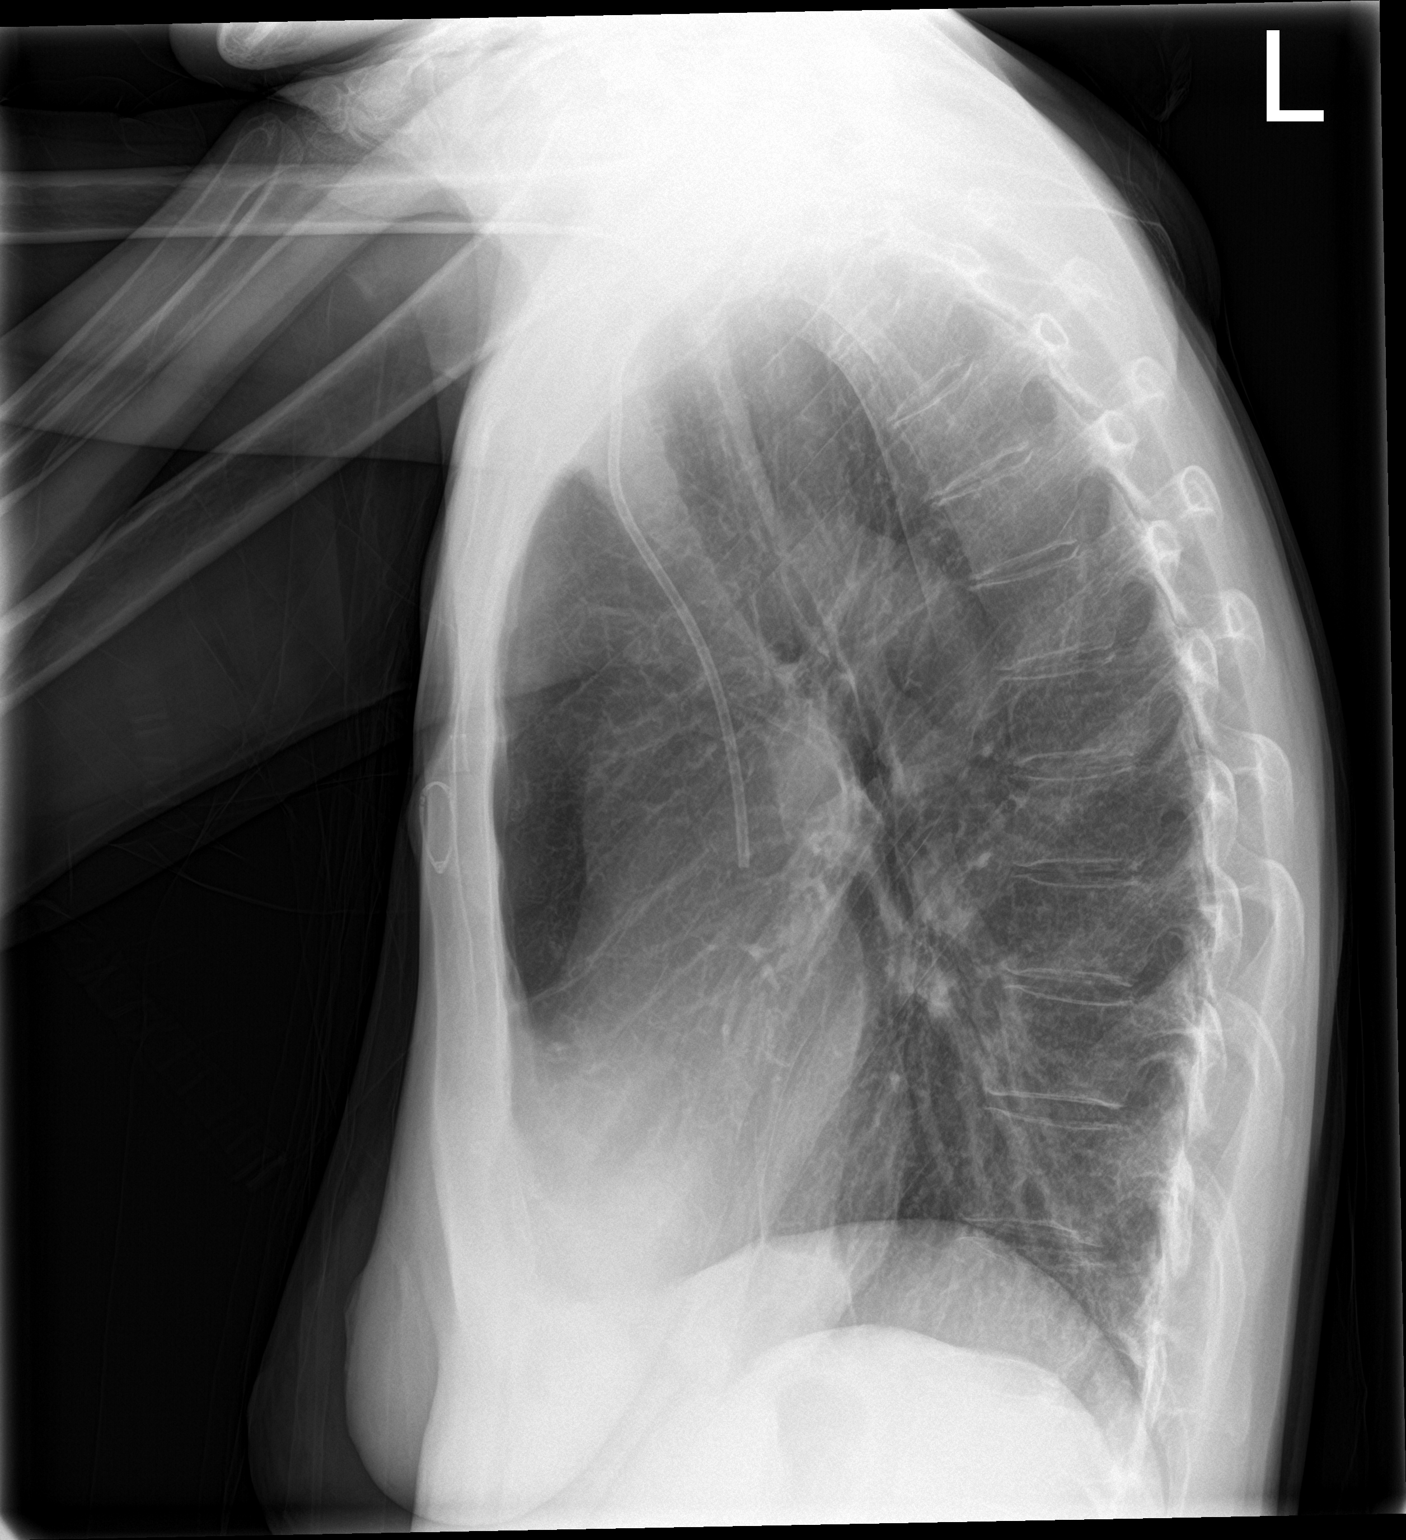

[2 of 2 positions shown; findings below may reference images not displayed]

FINDINGS: Stable left chest subclavian approach porta cath. Mediastinal
contours are stable and within normal limits. Stable lung volumes.
No pneumothorax, pulmonary edema, pleural effusion or confluent
pulmonary opacity. Stable mild increased interstitial markings
diffusely. Osteopenia. No acute osseous abnormality identified.
IMPRESSION: No acute cardiopulmonary abnormality.
